# Patient Record
Sex: Male | Born: 1940
Health system: Southern US, Community
[De-identification: ages and names within clinical notes are randomized; demographics above are authoritative.]

## PROBLEM LIST (undated history)

## (undated) DIAGNOSIS — I714 Abdominal aortic aneurysm, without rupture, unspecified: Secondary | ICD-10-CM

## (undated) DIAGNOSIS — E7800 Pure hypercholesterolemia, unspecified: Secondary | ICD-10-CM

## (undated) DIAGNOSIS — F419 Anxiety disorder, unspecified: Secondary | ICD-10-CM

## (undated) DIAGNOSIS — E039 Hypothyroidism, unspecified: Secondary | ICD-10-CM

## (undated) DIAGNOSIS — I639 Cerebral infarction, unspecified: Secondary | ICD-10-CM

## (undated) DIAGNOSIS — C4490 Unspecified malignant neoplasm of skin, unspecified: Secondary | ICD-10-CM

## (undated) DIAGNOSIS — I739 Peripheral vascular disease, unspecified: Secondary | ICD-10-CM

## (undated) DIAGNOSIS — M1990 Unspecified osteoarthritis, unspecified site: Secondary | ICD-10-CM

## (undated) DIAGNOSIS — K219 Gastro-esophageal reflux disease without esophagitis: Secondary | ICD-10-CM

## (undated) DIAGNOSIS — Z86010 Personal history of colonic polyps: Secondary | ICD-10-CM

## (undated) DIAGNOSIS — I10 Essential (primary) hypertension: Secondary | ICD-10-CM

## (undated) DIAGNOSIS — K760 Fatty (change of) liver, not elsewhere classified: Secondary | ICD-10-CM

## (undated) DIAGNOSIS — Z8719 Personal history of other diseases of the digestive system: Secondary | ICD-10-CM

## (undated) HISTORY — PX: ESOPHAGOGASTRODUODENOSCOPY: SHX1529

## (undated) HISTORY — DX: Fatty (change of) liver, not elsewhere classified: K76.0

## (undated) HISTORY — DX: Cerebral infarction, unspecified: I63.9

## (undated) HISTORY — DX: Essential (primary) hypertension: I10

## (undated) HISTORY — PX: INGUINAL HERNIA REPAIR: SUR1180

## (undated) HISTORY — DX: Abdominal aortic aneurysm, without rupture, unspecified: I71.40

## (undated) HISTORY — PX: NOSE SURGERY: SHX723

## (undated) HISTORY — DX: Abdominal aortic aneurysm, without rupture: I71.4

## (undated) HISTORY — DX: Personal history of colonic polyps: Z86.010

## (undated) HISTORY — DX: Gastro-esophageal reflux disease without esophagitis: K21.9

## (undated) HISTORY — DX: Anxiety disorder, unspecified: F41.9

## (undated) HISTORY — PX: TONSILLECTOMY: SUR1361

---

## 1989-01-18 HISTORY — PX: HIP FRACTURE SURGERY: SHX118

## 1999-10-25 ENCOUNTER — Encounter: Admission: RE | Admit: 1999-10-25 | Discharge: 1999-10-25 | Payer: Self-pay | Admitting: Family Medicine

## 1999-10-25 ENCOUNTER — Encounter: Payer: Self-pay | Admitting: Family Medicine

## 1999-10-30 ENCOUNTER — Encounter: Payer: Self-pay | Admitting: Family Medicine

## 1999-10-30 ENCOUNTER — Encounter: Admission: RE | Admit: 1999-10-30 | Discharge: 1999-10-30 | Payer: Self-pay | Admitting: Family Medicine

## 2002-02-10 HISTORY — PX: OTHER SURGICAL HISTORY: SHX169

## 2003-08-03 ENCOUNTER — Encounter: Admission: RE | Admit: 2003-08-03 | Discharge: 2003-08-03 | Payer: Self-pay | Admitting: Internal Medicine

## 2004-09-14 ENCOUNTER — Ambulatory Visit: Payer: Self-pay | Admitting: Endocrinology

## 2004-09-22 ENCOUNTER — Ambulatory Visit (HOSPITAL_COMMUNITY): Admission: RE | Admit: 2004-09-22 | Discharge: 2004-09-22 | Payer: Self-pay | Admitting: Endocrinology

## 2004-09-26 ENCOUNTER — Ambulatory Visit: Payer: Self-pay | Admitting: Endocrinology

## 2004-10-03 ENCOUNTER — Ambulatory Visit: Payer: Self-pay | Admitting: Endocrinology

## 2005-01-11 ENCOUNTER — Ambulatory Visit: Payer: Self-pay | Admitting: Endocrinology

## 2005-11-19 ENCOUNTER — Ambulatory Visit: Payer: Self-pay | Admitting: Endocrinology

## 2006-10-23 ENCOUNTER — Ambulatory Visit: Payer: Self-pay | Admitting: Endocrinology

## 2006-12-22 ENCOUNTER — Ambulatory Visit: Payer: Self-pay | Admitting: Endocrinology

## 2006-12-22 LAB — CONVERTED CEMR LAB
AST: 42 units/L — ABNORMAL HIGH (ref 0–37)
Albumin: 4.5 g/dL (ref 3.5–5.2)
BUN: 10 mg/dL (ref 6–23)
Basophils Relative: 0.5 % (ref 0.0–1.0)
CO2: 29 meq/L (ref 19–32)
Calcium: 9.6 mg/dL (ref 8.4–10.5)
Chloride: 98 meq/L (ref 96–112)
Creatinine, Ser: 0.8 mg/dL (ref 0.4–1.5)
Eosinophils Relative: 4.2 % (ref 0.0–5.0)
GFR calc Af Amer: 125 mL/min
GFR calc non Af Amer: 103 mL/min
Glucose, Bld: 116 mg/dL — ABNORMAL HIGH (ref 70–99)
HCT: 45.6 % (ref 39.0–52.0)
HDL: 32.9 mg/dL — ABNORMAL LOW (ref >39.0)
MCV: 87.3 fL (ref 78.0–100.0)
Nitrite: NEGATIVE
PSA: 0.29 ng/mL (ref 0.10–4.00)
Platelets: 163 10*3/uL (ref 150–400)
RBC / HPF: NONE SEEN
RBC: 5.22 M/uL (ref 4.22–5.81)
RDW: 12.4 % (ref 11.5–14.6)
Sodium: 136 meq/L (ref 135–145)
Total Bilirubin: 1.2 mg/dL (ref 0.3–1.2)
Total CHOL/HDL Ratio: 7.8
Total Protein, Urine: NEGATIVE mg/dL
Triglycerides: 264 mg/dL (ref 0–149)
Urine Glucose: NEGATIVE mg/dL
Urobilinogen, UA: 0.2 (ref 0.0–1.0)
WBC, UA: NONE SEEN cells/hpf

## 2007-03-07 ENCOUNTER — Encounter: Payer: Self-pay | Admitting: Endocrinology

## 2007-03-07 DIAGNOSIS — F17200 Nicotine dependence, unspecified, uncomplicated: Secondary | ICD-10-CM | POA: Insufficient documentation

## 2007-03-07 DIAGNOSIS — I10 Essential (primary) hypertension: Secondary | ICD-10-CM | POA: Insufficient documentation

## 2007-03-07 DIAGNOSIS — Z9089 Acquired absence of other organs: Secondary | ICD-10-CM

## 2007-03-07 DIAGNOSIS — F411 Generalized anxiety disorder: Secondary | ICD-10-CM

## 2007-03-07 DIAGNOSIS — I6529 Occlusion and stenosis of unspecified carotid artery: Secondary | ICD-10-CM

## 2007-03-07 DIAGNOSIS — Z860100 Personal history of colon polyps, unspecified: Secondary | ICD-10-CM | POA: Insufficient documentation

## 2007-03-07 DIAGNOSIS — K219 Gastro-esophageal reflux disease without esophagitis: Secondary | ICD-10-CM | POA: Insufficient documentation

## 2007-03-07 DIAGNOSIS — M25559 Pain in unspecified hip: Secondary | ICD-10-CM | POA: Insufficient documentation

## 2007-03-07 DIAGNOSIS — F528 Other sexual dysfunction not due to a substance or known physiological condition: Secondary | ICD-10-CM | POA: Insufficient documentation

## 2007-03-07 DIAGNOSIS — F329 Major depressive disorder, single episode, unspecified: Secondary | ICD-10-CM

## 2007-03-07 DIAGNOSIS — E7210 Disorders of sulfur-bearing amino-acid metabolism, unspecified: Secondary | ICD-10-CM | POA: Insufficient documentation

## 2007-05-19 ENCOUNTER — Telehealth: Payer: Self-pay | Admitting: Endocrinology

## 2007-11-13 ENCOUNTER — Ambulatory Visit: Payer: Self-pay | Admitting: Endocrinology

## 2007-11-13 LAB — CONVERTED CEMR LAB
Glucose, Urine, Semiquant: NEGATIVE
Ketones, urine, test strip: NEGATIVE
Nitrite: NEGATIVE
Protein, U semiquant: NEGATIVE
Urobilinogen, UA: 0.2
WBC Urine, dipstick: NEGATIVE

## 2008-01-21 ENCOUNTER — Ambulatory Visit: Payer: Self-pay | Admitting: Endocrinology

## 2008-01-21 DIAGNOSIS — M549 Dorsalgia, unspecified: Secondary | ICD-10-CM | POA: Insufficient documentation

## 2008-01-21 DIAGNOSIS — E7800 Pure hypercholesterolemia, unspecified: Secondary | ICD-10-CM

## 2008-01-21 DIAGNOSIS — N529 Male erectile dysfunction, unspecified: Secondary | ICD-10-CM | POA: Insufficient documentation

## 2008-12-06 ENCOUNTER — Telehealth: Payer: Self-pay | Admitting: Internal Medicine

## 2008-12-06 DIAGNOSIS — I714 Abdominal aortic aneurysm, without rupture: Secondary | ICD-10-CM

## 2008-12-27 ENCOUNTER — Ambulatory Visit: Payer: Self-pay | Admitting: Vascular Surgery

## 2008-12-27 ENCOUNTER — Encounter: Payer: Self-pay | Admitting: Endocrinology

## 2009-01-24 ENCOUNTER — Ambulatory Visit: Payer: Self-pay | Admitting: Endocrinology

## 2009-01-24 DIAGNOSIS — R05 Cough: Secondary | ICD-10-CM

## 2009-01-24 DIAGNOSIS — L723 Sebaceous cyst: Secondary | ICD-10-CM

## 2009-01-24 LAB — CONVERTED CEMR LAB
ALT: 43 units/L (ref 0–53)
Alkaline Phosphatase: 47 units/L (ref 39–117)
Basophils Absolute: 0.1 10*3/uL (ref 0.0–0.1)
Basophils Relative: 0.9 % (ref 0.0–3.0)
Bilirubin, Direct: 0 mg/dL (ref 0.0–0.3)
CO2: 30 meq/L (ref 19–32)
Cholesterol: 238 mg/dL — ABNORMAL HIGH (ref 0–200)
Direct LDL: 155.5 mg/dL
Eosinophils Relative: 4.5 % (ref 0.0–5.0)
Glucose, Bld: 123 mg/dL — ABNORMAL HIGH (ref 70–99)
HCT: 43.7 % (ref 39.0–52.0)
Lymphocytes Relative: 35.6 % (ref 12.0–46.0)
MCHC: 35 g/dL (ref 30.0–36.0)
Microalb Creat Ratio: 5.2 mg/g (ref 0.0–30.0)
Monocytes Absolute: 0.7 10*3/uL (ref 0.1–1.0)
Monocytes Relative: 8.8 % (ref 3.0–12.0)
Neutro Abs: 3.7 10*3/uL (ref 1.4–7.7)
Nitrite: NEGATIVE
PSA: 0.22 ng/mL (ref 0.10–4.00)
Platelets: 151 10*3/uL (ref 150.0–400.0)
Potassium: 4.4 meq/L (ref 3.5–5.1)
RDW: 12 % (ref 11.5–14.6)
Specific Gravity, Urine: 1.01 (ref 1.000–1.030)
TSH: 3.12 microintl units/mL (ref 0.35–5.50)
Total CHOL/HDL Ratio: 7
Total Protein, Urine: NEGATIVE mg/dL
Triglycerides: 273 mg/dL — ABNORMAL HIGH (ref 0.0–149.0)
Urine Glucose: NEGATIVE mg/dL
VLDL: 54.6 mg/dL — ABNORMAL HIGH (ref 0.0–40.0)
WBC: 7.5 10*3/uL (ref 4.5–10.5)
pH: 7 (ref 5.0–8.0)

## 2010-01-24 ENCOUNTER — Encounter: Payer: Self-pay | Admitting: Endocrinology

## 2010-01-24 ENCOUNTER — Ambulatory Visit: Payer: Self-pay | Admitting: Endocrinology

## 2010-01-24 DIAGNOSIS — L259 Unspecified contact dermatitis, unspecified cause: Secondary | ICD-10-CM

## 2010-01-26 ENCOUNTER — Telehealth (INDEPENDENT_AMBULATORY_CARE_PROVIDER_SITE_OTHER): Payer: Self-pay | Admitting: *Deleted

## 2010-02-08 ENCOUNTER — Encounter: Payer: Self-pay | Admitting: Endocrinology

## 2010-02-09 ENCOUNTER — Ambulatory Visit: Payer: Self-pay

## 2010-02-09 ENCOUNTER — Encounter: Payer: Self-pay | Admitting: Vascular Surgery

## 2010-02-09 ENCOUNTER — Encounter: Payer: Self-pay | Admitting: Cardiovascular Disease

## 2010-02-09 ENCOUNTER — Encounter: Payer: Self-pay | Admitting: Endocrinology

## 2010-05-07 ENCOUNTER — Ambulatory Visit: Payer: Self-pay | Admitting: Endocrinology

## 2010-05-08 ENCOUNTER — Telehealth: Payer: Self-pay | Admitting: Internal Medicine

## 2010-06-17 LAB — CONVERTED CEMR LAB
Albumin: 4.4 g/dL (ref 3.5–5.2)
Alkaline Phosphatase: 47 units/L (ref 39–117)
Alkaline Phosphatase: 51 units/L (ref 39–117)
BUN: 12 mg/dL (ref 6–23)
Basophils Absolute: 0 10*3/uL (ref 0.0–0.1)
Basophils Absolute: 0.1 10*3/uL (ref 0.0–0.1)
Basophils Relative: 0.4 % (ref 0.0–3.0)
Basophils Relative: 0.9 % (ref 0.0–3.0)
Bilirubin, Direct: 0.1 mg/dL (ref 0.0–0.3)
Bilirubin, Direct: 0.2 mg/dL (ref 0.0–0.3)
Chloride: 96 meq/L (ref 96–112)
Chloride: 99 meq/L (ref 96–112)
Cholesterol: 213 mg/dL (ref 0–200)
Cholesterol: 239 mg/dL — ABNORMAL HIGH (ref 0–200)
Creatinine, Ser: 0.7 mg/dL (ref 0.4–1.5)
Creatinine,U: 133.4 mg/dL
Direct LDL: 172.4 mg/dL
Direct LDL: 204.1 mg/dL
Eosinophils Absolute: 0.3 10*3/uL (ref 0.0–0.7)
Eosinophils Absolute: 0.4 10*3/uL (ref 0.0–0.7)
Eosinophils Relative: 5.1 % — ABNORMAL HIGH (ref 0.0–5.0)
GFR calc non Af Amer: 120 mL/min
GFR calc non Af Amer: 129.75 mL/min (ref >60)
Glucose, Bld: 114 mg/dL — ABNORMAL HIGH (ref 70–99)
Glucose, Bld: 126 mg/dL — ABNORMAL HIGH (ref 70–99)
HCT: 45.8 % (ref 39.0–52.0)
HDL: 37.4 mg/dL — ABNORMAL LOW (ref >39.0)
Hemoglobin, Urine: NEGATIVE
Hemoglobin: 15.9 g/dL (ref 13.0–17.0)
Hgb A1c MFr Bld: 6.1 % — ABNORMAL HIGH (ref 4.6–6.0)
Leukocytes, UA: NEGATIVE
MCV: 88.6 fL (ref 78.0–100.0)
Microalb Creat Ratio: 1.7 mg/g (ref 0.0–30.0)
Microalb Creat Ratio: 8.2 mg/g (ref 0.0–30.0)
Monocytes Absolute: 0.7 10*3/uL (ref 0.1–1.0)
Monocytes Relative: 9.1 % (ref 3.0–12.0)
Neutro Abs: 3.8 10*3/uL (ref 1.4–7.7)
Neutrophils Relative %: 55.1 % (ref 43.0–77.0)
PSA: 0.37 ng/mL (ref 0.10–4.00)
PSA: 0.43 ng/mL (ref 0.10–4.00)
Platelets: 138 10*3/uL — ABNORMAL LOW (ref 150.0–400.0)
Platelets: 143 10*3/uL — ABNORMAL LOW (ref 150–400)
RBC: 5.17 M/uL (ref 4.22–5.81)
RDW: 11.9 % (ref 11.5–14.6)
RDW: 14 % (ref 11.5–14.6)
Sodium: 135 meq/L (ref 135–145)
Sodium: 137 meq/L (ref 135–145)
Specific Gravity, Urine: 1.01 (ref 1.000–1.030)
TSH: 3.27 microintl units/mL (ref 0.35–5.50)
TSH: 4.26 microintl units/mL (ref 0.35–5.50)
Total Bilirubin: 0.7 mg/dL (ref 0.3–1.2)
Total Bilirubin: 1 mg/dL (ref 0.3–1.2)
Total CHOL/HDL Ratio: 6
Total Protein: 6.9 g/dL (ref 6.0–8.3)
Urobilinogen, UA: 0.2 (ref 0.0–1.0)
VLDL: 24 mg/dL (ref 0–40)
VLDL: 39.6 mg/dL (ref 0.0–40.0)

## 2010-06-21 NOTE — Assessment & Plan Note (Signed)
Summary: tick bite??/cd   Vital Signs:  Patient profile:   70 year old male Height:      66 inches (167.64 cm) Weight:      167.50 pounds (76.14 kg) BMI:     27.13 O2 Sat:      90 % on Room air Temp:     97.7 degrees F (36.50 degrees C) oral Pulse rate:   68 / minute Pulse rhythm:   regular BP sitting:   132 / 84  (left arm) Cuff size:   regular  Vitals Entered By: Brenton Grills CMA Duncan Dull) (May 07, 2010 2:18 PM)  O2 Flow:  Room air CC: Tick bite on Left leg/pt declined flu shot/aj Is Patient Diabetic? Yes Comments Pt has never had Zostavax   CC:  Tick bite on Left leg/pt declined flu shot/aj.  History of Present Illness: pt states 2 days of slight pain at the left medial thigh, and assoc rash.  he removed a tick from there, which he brings today.    Current Medications (verified): 1)  Levothroid 50 Mcg Tabs (Levothyroxine Sodium) .... Take 1 By Mouth Qd 2)  Prevacid 15 Mg Cpdr (Lansoprazole) .... Take 1 Capsule By Mouth Once A Day 3)  Zestoretic 20-12.5 Mg Tabs (Lisinopril-Hydrochlorothiazide) .... Take 2 By Mouth Qd 4)  Amlodipine Besylate 5 Mg Tabs (Amlodipine Besylate) .Marland Kitchen.. 1 Qd 5)  Triamcinolone Acetonide 0.1 % Crea (Triamcinolone Acetonide) .... Three Times A Day As Needed For Rash 6)  Alprazolam 0.25 Mg Tabs (Alprazolam) .... 1/2-1 Tab Once Daily As Needed For Anxiety  Allergies (verified): 1)  ! Pcn 2)  ! * Actos 3)  ! Lipitor  Past History:  Past Medical History: Last updated: 03/07/2007 Anxiety Colonic polyps, hx of Depression Diabetes mellitus, type II GERD Hypertension Hepatic Steatosis (R) Parotid Stone  Review of Systems  The patient denies fever.    Physical Exam  General:  normal appearance.   Skin:  left medial thigh:  there is an insect bite, with 1 cm ring of erythema surrounding it.     Impression & Recommendations:  Problem # 1:  TICK BITE (ICD-E906.4) Assessment New  Medications Added to Medication List This Visit: 1)   Doxycycline Hyclate 100 Mg Caps (Doxycycline hyclate) .Marland Kitchen.. 1 tab two times a day  Other Orders: Est. Patient Level III (04540)  Patient Instructions: 1)  doxycycline 100 mg two times a day 2)  call next week if not improving Prescriptions: DOXYCYCLINE HYCLATE 100 MG CAPS (DOXYCYCLINE HYCLATE) 1 tab two times a day  #14 x 0   Entered and Authorized by:   Minus Breeding MD   Signed by:   Minus Breeding MD on 05/07/2010   Method used:   Electronically to        [facility] [address] [phone]* (retail)       [address]       [place], [place]  [address]       Ph: [phone] or [phone]       Fax: [phone]   RxID:   [id number]    Orders Added: 1)  Est. Patient Level III [95638]   Not Administered:    Influenza Vaccine not given due to: declined     Preventive Care Screening  Colonoscopy:    Date:  05/20/2005    Results:  normal

## 2010-06-21 NOTE — Assessment & Plan Note (Signed)
Summary: YEARLY FU/ MEDICARE / TO COME FASTING/NWS  #   Vital Signs:  Patient profile:   70 year old male Height:      66 inches (167.64 cm) Weight:      168 pounds (76.36 kg) BMI:     27.21 O2 Sat:      95 % on Room air Temp:     96.7 degrees F (35.94 degrees C) oral Pulse rate:   65 / minute BP sitting:   118 / 82  (left arm) Cuff size:   regular  Vitals Entered By: Brenton Grills MA (January 24, 2010 8:21 AM)  O2 Flow:  Room air CC: Yearly follow up visit/Medicare/pt is no longer taking Tramadol and is taking Prevacid OTC/aj Is Patient Diabetic? Yes  Vision Screening:Left eye w/o correction: 20 / 20 Right Eye w/o correction: 20 / 25 Both eyes w/o correction:  20/ 20        Vision Entered By: Brenton Grills MA (January 24, 2010 9:21 AM)   CC:  Yearly follow up visit/Medicare/pt is no longer taking Tramadol and is taking Prevacid OTC/aj.  History of Present Illness: pt is here for medicare welllness visit.  he denies memory loss.  he says he is able to perform activities of daily living without assistance.  he has no limitations to physical activity.  depression persists   Current Medications (verified): 1)  Levothroid 50 Mcg Tabs (Levothyroxine Sodium) .... Take 1 By Mouth Qd 2)  Prevacid 15 Mg Cpdr (Lansoprazole) .... Take 1 Capsule By Mouth Once A Day 3)  Zestoretic 20-12.5 Mg Tabs (Lisinopril-Hydrochlorothiazide) .... Take 2 By Mouth Qd 4)  Guanfacine Hcl 1 Mg  Tabs (Guanfacine Hcl) .... Take 1/2 Tab By Mouth Qd 5)  Tramadol Hcl 50 Mg Tabs (Tramadol Hcl) .Marland Kitchen.. 1 Q4h As Needed Pain 6)  Amlodipine Besylate 5 Mg Tabs (Amlodipine Besylate) .Marland Kitchen.. 1 Qd  Allergies (verified): 1)  ! Pcn 2)  ! * Actos 3)  ! Lipitor  Past History:  Past Medical History: Last updated: 03/07/2007 Anxiety Colonic polyps, hx of Depression Diabetes mellitus, type II GERD Hypertension Hepatic Steatosis (R) Parotid Stone  Family History: Reviewed history from 01/21/2008 and no  changes required. no cancer  Social History: Reviewed history from 01/24/2009 and no changes required. married retired Sales executive smoker alcohol is 6-pack every 4 days. no illegal drugs.  Physical Exam  Msk:  pt easily and quickly performs "get-up-and-go" from a sitting position  Psych:  remembers 3/3 at 5 minutes.  excellent recall.  can easily read and write a sentence.  alert and oriented x 3  Additional Exam:  SEPARATE EVALUATION FOLLOWS--EACH PROBLEM HERE IS NEW, NOT RESPONDING TO TREATMENT, OR POSES SIGNIFICANT RISK TO THE PATIENT'S HEALTH: HISTORY OF THE PRESENT ILLNESS: few weeks of moderate rash on the posterior aspects of the legs, chest, and scalp, and assoc itching anxiety has recurred off the xanax pt did not tolerate lipitor in the past. PAST MEDICAL HISTORY reviewed and up to date today REVIEW OF SYSTEMS: denies fever and weight change PHYSICAL EXAMINATION: dorsalis pedis intact bilat.  no carotid bruit neurol:  sensation is intact to touch on the feet feet: no deformity.  no ulcer on the feet.  feet are of normal color and temp.  no edema moderate eczematous rash on these areas LAB/XRAY RESULTS: ldl chol is high IMPRESSION: dyslipidemia, needs increased rx rash, uncertain etiology anxiety, recurrent PLAN: see instruction page   Impression & Recommendations:  Problem #  1:  ROUTINE GENERAL MEDICAL EXAM@HEALTH  CARE FACL (ICD-V70.0)  Problem # 2:  DEPRESSION (ICD-311) poss exac by guanfacine  Medications Added to Medication List This Visit: 1)  Triamcinolone Acetonide 0.1 % Crea (Triamcinolone acetonide) .... Three times a day as needed for rash 2)  Alprazolam 0.25 Mg Tabs (Alprazolam) .... 1/2-1 tab once daily as needed for anxiety  Other Orders: Vascular Clinic (Vascular) Vascular Clinic (Vascular) Dermatology Referral (Derma) EKG w/ Interpretation (93000) TLB-Lipid Panel (80061-LIPID) TLB-BMP (Basic Metabolic Panel-BMET) (80048-METABOL) TLB-CBC  Platelet - w/Differential (85025-CBCD) TLB-Hepatic/Liver Function Pnl (80076-HEPATIC) TLB-TSH (Thyroid Stimulating Hormone) (84443-TSH) TLB-A1C / Hgb A1C (Glycohemoglobin) (83036-A1C) TLB-Microalbumin/Creat Ratio, Urine (82043-MALB) TLB-PSA (Prostate Specific Antigen) (84153-PSA) TLB-Udip w/ Micro (81001-URINE) Medicare -1st Annual Wellness Visit [phone]) Est. Patient Level IV (62952)  Patient Instructions: 1)  please consider these measures for your health:  minimize alcohol.  do not use tobacco products.  have a colonoscopy at least every 10 years from age 69.  keep firearms safely stored.  always use seat belts.  have working smoke alarms in your home.  see an eye doctor and dentist regularly.  never drive under the influence of alcohol or drugs (including prescription drugs).  those with fair skin should take precautions against the sun. 2)  please let me know what your wishes would be, if artificial life support measures should become necessary.  it is critically important to prevent falling down (keep floor areas well-lit, dry, and free of loose objects) 3)  stop guanfacine 4)  good diet and exercise habits significanly improve the control of your diabetes.  please let me know if you wish to be referred to a dietician.  high blood sugar is very risky to your health.  you should see an eye doctor every year. 5)  controlling your blood pressure and cholesterol drastically reduces the damage diabetes does to your body.  this also applies to quitting smoking.  please discuss these with your doctor.  you should take an aspirin every day, unless you have been advised by a doctor not to. 6)  triamcinolone cream three times a day as needed for itching. 7)  check carotid arteries, and abdominal aorta.  you will be called with a day and time for an appointment. 8)  xanax once daily as needed for anxiety.   9)  here is a list of medicare-covered preventive services. 10)  please let me know if you  decide to get a colonoscopy.  it can prevent you from dyinjg from colon cancer. 11)  refer dermatology.  you will be called with a day and time for an appointment 12)  (update: i left message on phone-tree:  you should consider cholesterol medication). Prescriptions: ALPRAZOLAM 0.25 MG TABS (ALPRAZOLAM) 1/2-1 tab once daily as needed for anxiety  #30 x 5   Entered and Authorized by:   Minus Breeding MD   Signed by:   Minus Breeding MD on 01/24/2010   Method used:   Print then Give to Patient   RxID:   [id number] TRIAMCINOLONE ACETONIDE 0.1 % CREA (TRIAMCINOLONE ACETONIDE) three times a day as needed for rash  #1 med tube x 0   Entered and Authorized by:   Minus Breeding MD   Signed by:   Minus Breeding MD on 01/24/2010   Method used:   Electronically to        [facility] [address] [phone]* (retail)       [address]  [place], [place]  [address]       Ph: [phone] or [phone]       Fax: [phone]   RxID:   [id number]

## 2010-06-21 NOTE — Progress Notes (Signed)
Summary: ABX reaction/SAE pt   Phone Note Call from Patient Call back at Home Phone [phone]   Caller: Patient Summary of Call: Pt's spouse called stating that 30 minutes after taking ABX this morning pt vomited and broke out in cold sweats. Pt has not vomited since but is still feeling slightly dizzy. I advised spouse to D/C ABX and I would request advisement for MD. Initial call taken by: Margaret Pyle, CMA,  May 08, 2010 1:57 PM  Follow-up for Phone Call        stop doxy - start septra x 5 days - new erx done - take with food - call if worse or problems Follow-up by: Newt Lukes MD,  May 08, 2010 4:38 PM  Additional Follow-up for Phone Call Additional follow up Details #1::        Pt advised in detail of above and will call back as needed. Additional Follow-up by: Margaret Pyle, CMA,  May 08, 2010 4:55 PM    New/Updated Medications: SEPTRA DS 800-160 MG TABS (SULFAMETHOXAZOLE-TRIMETHOPRIM) 1 by mouth two times a day x 5days Prescriptions: SEPTRA DS 800-160 MG TABS (SULFAMETHOXAZOLE-TRIMETHOPRIM) 1 by mouth two times a day x 5days  #10 x 0   Entered and Authorized by:   Newt Lukes MD   Signed by:   Newt Lukes MD on 05/08/2010   Method used:   Electronically to        [facility] [address] [phone]* (retail)       [address]       [place], [place]  [address]       Ph: [phone] or [phone]       Fax: [phone]   RxID:   [id number]

## 2010-06-21 NOTE — Miscellaneous (Signed)
Summary: Orders Update  Clinical Lists Changes  Orders: Added new Test order of Abdominal Aorta Duplex (Abd Aorta Duplex) - Signed 

## 2010-06-21 NOTE — Progress Notes (Signed)
----   Converted from flag ---- ---- 01/25/2010 1:12 PM, Edman Circle wrote: APPT 9/23 @ 8:00 FOR CAROTID & 9:00 FOR THE AORTA  ---- 01/25/2010 12:16 PM, Dagoberto Reef wrote: Asher Muir, pt need [place] ab aorta and doppler.Thanks ------------------------------

## 2010-06-21 NOTE — Miscellaneous (Signed)
Summary: Orders Update  Clinical Lists Changes  Orders: Added new Test order of Carotid Duplex (Carotid Duplex) - Signed 

## 2010-10-02 NOTE — Procedures (Signed)
DUPLEX ULTRASOUND OF ABDOMINAL AORTA   INDICATION:  Follow up aortic dilatation.   HISTORY:  Diabetes:  No.  Cardiac:  No.  Hypertension:  Yes.  Smoking:  Yes.  Connective Tissue Disorder:  Family History:  No.  Previous Surgery:  No.   DUPLEX EXAM:         AP (cm)                   TRANSVERSE (cm)  Proximal             1.92 cm                   1.83 cm  Mid                  2.23 cm                   2.06 cm  Distal               2.98 cm                   2.92 cm  Right Iliac          P = 1.06 cm, D = 1.23 cm  P = 1.12 cm, D = 1.18 cm  Left Iliac           P = 1.03 cm, D = 1.80 cm  P = 1.12 cm, D = 1.89 cm   PREVIOUS:  Date: 09/04/04  AP:  2.5  TRANSVERSE:  2.7   IMPRESSION:  1. Stable abdominal aortic aneurysm measuring 2.98 cm X 2.92 cm, when      compared to previous studies.  2. Left common iliac artery distally at area of bifurcation appears      dilated, measuring 1.8 cm X 1.89 cm, compared to right at 1.23 cm X      1.18 cm.   ___________________________________________  Ronnie Snyder. Hart Rochester, M.D.   AS/MEDQ  D:  12/27/2008  T:  12/27/2008  Job:  [id number]

## 2010-10-02 NOTE — Consult Note (Signed)
NEW PATIENT CONSULTATION   LETROY, VAZGUEZ  DOB:  August 31, 1940                                       12/27/2008  ZOXWR#:[id number]   Mr. Shober is a 70 year old male patient previously seen by [place]  several years ago for a small abdominal aortic aneurysm, maximum size of  only 3.1 cm.  He is referred today for further evaluation of abdominal  aortic aneurysm.  He denies any abdominal symptoms and has no new back  symptoms, but does have chronic low back discomfort which radiates into  the legs.  He denies claudication.   PAST MEDICAL HISTORY:  Negative for diabetes, DVT, or COPD.  He does  have hypertension and hypercholesterolemia treated medically.   PREVIOUS SURGERY:  Right inguinal hernia, repair right hip pinning, and  previous clavicle fracture.   FAMILY HISTORY:  Positive for CHF, coronary artery disease, stroke in  his father and diabetes in a sibling and grandmother.   SOCIAL HISTORY:  He smokes a pack of cigarettes per day for 45+ years,  drinks occasional alcohol.   REVIEW OF SYSTEMS:  Denies claudication, chest pain, or COPD symptoms.   ALLERGIES:  Penicillin.   PHYSICAL EXAMINATION:  Blood pressure is 160/88, heart rate 71,  respirations 14.  Generally, he is a healthy-appearing male in no  apparent stress, alert and oriented x3.  Neck:  Supple, 3+ carotid  pulses palpable.  No bruits are audible.  Neurologic exam is normal.  No  palpable adenopathy in the neck.  Chest:  Clear to auscultation.  Cardiovascular exam:  Regular rhythm.  No murmurs.  Abdomen:  Soft,  nontender with no masses.  3+ femoral, popliteal, and posterior tibial  pulses bilaterally.   Abdominal aortic aneurysm is stable at 3 cm.  We will monitor this every  other year with a duplex scan in our office unless he develops any acute  abdominal or back symptoms.   Quita Skye Hart Rochester, M.D.  Electronically Signed   JDL/MEDQ  D:  12/27/2008  T:  12/28/2008  Job:  [id number]   cc:   Gregary Signs A. Everardo All, MD

## 2010-10-12 ENCOUNTER — Other Ambulatory Visit: Payer: Self-pay | Admitting: Endocrinology

## 2010-10-31 ENCOUNTER — Other Ambulatory Visit: Payer: Self-pay | Admitting: Endocrinology

## 2010-11-20 ENCOUNTER — Other Ambulatory Visit: Payer: Self-pay | Admitting: Endocrinology

## 2010-12-19 ENCOUNTER — Other Ambulatory Visit: Payer: Self-pay

## 2010-12-25 ENCOUNTER — Other Ambulatory Visit: Payer: Self-pay

## 2011-02-14 ENCOUNTER — Other Ambulatory Visit: Payer: Self-pay | Admitting: Endocrinology

## 2011-02-22 ENCOUNTER — Other Ambulatory Visit: Payer: Self-pay | Admitting: Cardiology

## 2011-02-22 DIAGNOSIS — I714 Abdominal aortic aneurysm, without rupture: Secondary | ICD-10-CM

## 2011-02-25 ENCOUNTER — Encounter (INDEPENDENT_AMBULATORY_CARE_PROVIDER_SITE_OTHER): Payer: Medicare Other | Admitting: Cardiology

## 2011-02-25 DIAGNOSIS — I714 Abdominal aortic aneurysm, without rupture: Secondary | ICD-10-CM

## 2011-03-17 ENCOUNTER — Other Ambulatory Visit: Payer: Self-pay | Admitting: Endocrinology

## 2011-05-07 ENCOUNTER — Other Ambulatory Visit: Payer: Self-pay | Admitting: Endocrinology

## 2011-05-22 ENCOUNTER — Telehealth: Payer: Self-pay

## 2011-05-22 NOTE — Telephone Encounter (Signed)
Was there broken skin and/or bleeding?

## 2011-05-22 NOTE — Telephone Encounter (Signed)
Pt's spouse called stating pt was bitten by their 70 year old dog. Spouse has cleaned area and is using an antibacterial ointment as well.  Dog is up to date with shot but spouse is concerned and is requesting advisement from MD.

## 2011-05-22 NOTE — Telephone Encounter (Signed)
If a small area only, please start right now - OTC triple antibiotic or Bacitracin bid for 7 days  If there is any worsening fever, red, swelling, pain, please call back tomorrow for appt - may need antibx

## 2011-05-22 NOTE — Telephone Encounter (Signed)
Patient informed of MD's instructions

## 2011-05-22 NOTE — Telephone Encounter (Signed)
Per spouse, there was a small area where the skin was broken/mild bleeding, but no further bleeding since area was cleaned.

## 2011-06-27 DIAGNOSIS — M779 Enthesopathy, unspecified: Secondary | ICD-10-CM | POA: Diagnosis not present

## 2011-06-27 DIAGNOSIS — M19079 Primary osteoarthritis, unspecified ankle and foot: Secondary | ICD-10-CM | POA: Diagnosis not present

## 2011-07-25 DIAGNOSIS — M779 Enthesopathy, unspecified: Secondary | ICD-10-CM | POA: Diagnosis not present

## 2011-07-29 ENCOUNTER — Other Ambulatory Visit: Payer: Self-pay | Admitting: Cardiology

## 2011-07-29 DIAGNOSIS — I739 Peripheral vascular disease, unspecified: Secondary | ICD-10-CM

## 2011-07-30 ENCOUNTER — Encounter (INDEPENDENT_AMBULATORY_CARE_PROVIDER_SITE_OTHER): Payer: Medicare Other

## 2011-07-30 ENCOUNTER — Other Ambulatory Visit: Payer: Self-pay

## 2011-07-30 DIAGNOSIS — I70219 Atherosclerosis of native arteries of extremities with intermittent claudication, unspecified extremity: Secondary | ICD-10-CM

## 2011-07-30 DIAGNOSIS — I739 Peripheral vascular disease, unspecified: Secondary | ICD-10-CM

## 2011-07-31 ENCOUNTER — Other Ambulatory Visit: Payer: Self-pay | Admitting: Endocrinology

## 2011-08-14 ENCOUNTER — Encounter: Payer: Self-pay | Admitting: *Deleted

## 2011-08-21 ENCOUNTER — Encounter: Payer: Self-pay | Admitting: Cardiovascular Disease

## 2011-08-21 ENCOUNTER — Encounter (INDEPENDENT_AMBULATORY_CARE_PROVIDER_SITE_OTHER): Payer: Medicare Other

## 2011-08-21 ENCOUNTER — Ambulatory Visit (INDEPENDENT_AMBULATORY_CARE_PROVIDER_SITE_OTHER): Payer: Medicare Other | Admitting: Cardiovascular Disease

## 2011-08-21 VITALS — BP 140/84 | HR 72 | Resp 18 | Ht 69.0 in | Wt 165.8 lb

## 2011-08-21 DIAGNOSIS — I70219 Atherosclerosis of native arteries of extremities with intermittent claudication, unspecified extremity: Secondary | ICD-10-CM | POA: Diagnosis not present

## 2011-08-21 DIAGNOSIS — I739 Peripheral vascular disease, unspecified: Secondary | ICD-10-CM

## 2011-08-21 DIAGNOSIS — I714 Abdominal aortic aneurysm, without rupture: Secondary | ICD-10-CM

## 2011-08-21 MED ORDER — CILOSTAZOL 100 MG PO TABS: 100.0000 mg | ORAL_TABLET | Freq: Two times a day (BID) | ORAL | Status: DC

## 2011-08-21 NOTE — Assessment & Plan Note (Signed)
The patient has moderate reduction in his left ABI, decreased left-sided pulses, and clinical symptoms suggestive of arterial insufficiency of the left leg. I reviewed options of medical therapy versus angiography with an eye towards revascularization. The patient prefers an initial trial of medical therapy. I've asked him to start 81 mg of aspirin daily. He's concerned about a medicine interaction with aspirin, but he cannot remember the name of the medication he is taking. He will call the office tomorrow to let us know. He will also start Pletal 50 mg twice daily for one week then 100 mg twice daily. I would like to see him back in 3 months to assess his clinical response to medical therapy. If he continues to have significant limitation I would recommend considering angiography with an eye toward PTA.  We discussed the importance of complete tobacco cessation as it relates to his abdominal aortic aneurysm and lower extremity peripheral arterial disease. He understands this.

## 2011-08-21 NOTE — Assessment & Plan Note (Signed)
Abdominal duplex scan reviewed. The patient is followed with yearly scans.

## 2011-08-21 NOTE — Progress Notes (Signed)
HPI:  This is a 71 year old gentleman referred for evaluation of lower extremity peripheral arterial disease. The patient has been seen by Dr. Al Corpus for bilateral foot pain. He was treated with cortisone injections and has had a very good response. However, he has complained of left calf pain with ambulation and was noted to have an abnormal pulse exam. He was referred for vascular studies which demonstrated ABIs of 0.87 on the right and 0.68 on the left. A duplex scan was done and this showed diffuse plaque bilaterally with no significant stenosis on the left side. On the right, there was greater than 50% stenosis in the proximal superficial femoral artery.  The patient describes left calf tightness with walking now for several years. The symptoms have been slowly progressive. He denies right leg pain. He denies left thigh or hip pain. His pain eases up after he rests for a few minutes. He has no history of rest pain or ischemic ulceration. The patient is a long-time smoker since age 66 he continues to smoke one pack daily.  He denies chest pain, chest pressure, dyspnea, palpitations, lightheadedness, or other cardiovascular symptoms.  Outpatient Encounter Prescriptions as of 08/21/2011  Medication Sig Dispense Refill  . amLODipine (NORVASC) 5 MG tablet TAKE 1 TABLET BY MOUTH ONCE DAILY  30 tablet  0  . lisinopril-hydrochlorothiazide (PRINZIDE,ZESTORETIC) 20-12.5 MG per tablet TAKE 2 TABLETS BY MOUTH ONCE DAILY  60 tablet  0  . meloxicam (MOBIC) 15 MG tablet       . triamcinolone cream (KENALOG) 0.1 % Apply topically 3 (three) times daily.      Marland Kitchen UNITHROID 50 MCG tablet TAKE 1 TABLET BY MOUTH ONCE DAILY  30 tablet  0  . ALPRAZolam (XANAX) 0.25 MG tablet Take 0.25 mg by mouth at bedtime as needed.      . cilostazol (PLETAL) 100 MG tablet Take 1 tablet (100 mg total) by mouth 2 (two) times daily.  60 tablet  6  . lansoprazole (PREVACID) 15 MG capsule Take 15 mg by mouth daily.        Atorvastatin;  Penicillins; and Pioglitazone  Past Medical History  Diagnosis Date  . Anxiety   . Hx of colonic polyps   . Depression   . Diabetes mellitus   . GERD (gastroesophageal reflux disease)   . HTN (hypertension)   . Hepatic steatosis     Past Surgical History  Procedure Date  . Inguinal hernia repair   . Tonsillectomy   . Esophagogastroduodenoscopy   . Gated spect wall motion stress cardiolite 02/10/2002    History   Social History  . Marital Status: Married    Spouse Name: N/A    Number of Children: N/A  . Years of Education: N/A   Occupational History  . retired     Sales executive   Social History Main Topics  . Smoking status: Current Everyday Smoker  . Smokeless tobacco: Not on file  . Alcohol Use: Yes     6 pack every 4 days  . Drug Use: No  . Sexually Active: Not on file   Other Topics Concern  . Not on file   Social History Narrative  . No narrative on file    Family history: The patient's father had a stroke, his mother died of congestive heart failure. There is no history of aneurysm in the family.  ROS: General: no fevers/chills/night sweats Eyes: no blurry vision, diplopia, or amaurosis ENT: no sore throat or hearing loss Resp: no cough,  wheezing, or hemoptysis CV: no edema or palpitations GI: no abdominal pain, nausea, vomiting, diarrhea, or constipation GU: no dysuria, frequency, or hematuria Skin: no rash Neuro: no headache, numbness, tingling, or weakness of extremities Musculoskeletal: no joint pain or swelling Heme: no bleeding, DVT, or easy bruising Endo: no polydipsia or polyuria  BP 140/84  Pulse 72  Resp 18  Ht 5\' 9"  (1.753 m)  Wt 75.206 kg (165 lb 12.8 oz)  BMI 24.48 kg/m2  PHYSICAL EXAM: Pt is alert and oriented, WD, WN, in no distress. HEENT: normal Neck: JVP normal. Carotid upstrokes normal without bruits. No thyromegaly. Lungs: equal expansion, clear bilaterally CV: Apex is discrete and nondisplaced, RRR without murmur or  gallop Abd: soft, NT, +BS, no bruit, no hepatosplenomegaly Back: no CVA tenderness Ext: no C/C/E        Femoral pulses 2+ on the right and 1+ on the left        DP/PT pulses are 2+ on the right and 1+ on the left Skin: warm and dry without rash Neuro: CNII-XII intact             Strength intact = bilaterally  EKG:  Normal sinus rhythm 68 beats per minute, within normal limits.  ASSESSMENT AND PLAN:

## 2011-08-21 NOTE — Patient Instructions (Signed)
Your physician has recommended you make the following change in your medication: START Pletal 100mg  take one-half tablet by mouth twice a day for one week and then increase to one tablet by mouth twice a day  Your physician wants you to follow-up in: 3 MONTHS with Dr Excell Seltzer.  You will receive a reminder letter in the mail two months in advance. If you don't receive a letter, please call our office to schedule the follow-up appointment.  Please call with the name of medication that cannot be taken with Aspirin.  We will make a decision if it is okay to start Aspirin.

## 2011-08-22 ENCOUNTER — Telehealth: Payer: Self-pay | Admitting: Cardiovascular Disease

## 2011-08-22 NOTE — Telephone Encounter (Signed)
I spoke with the pt's wife and made her aware that the pt can start ASA 81mg  daily. The pt went home and reviewed his medication list and I updated his list.

## 2011-08-22 NOTE — Telephone Encounter (Signed)
New Problem:     Patient called in wanting to let you know about the medication meloxicam 15mg  (Mobic) and how it may interfere with the new medication Dr. Excell Seltzer would like prescribe.  Please call back.

## 2011-08-22 NOTE — Telephone Encounter (Signed)
I will forward this message to Dr Excell Seltzer to make a decision if ASA is okay to start and what dose.

## 2011-08-22 NOTE — Telephone Encounter (Signed)
He should start asa 81 mg daily, thx.

## 2011-08-28 ENCOUNTER — Telehealth: Payer: Self-pay | Admitting: *Deleted

## 2011-08-28 DIAGNOSIS — N320 Bladder-neck obstruction: Secondary | ICD-10-CM

## 2011-08-28 DIAGNOSIS — E119 Type 2 diabetes mellitus without complications: Secondary | ICD-10-CM

## 2011-08-28 DIAGNOSIS — E7800 Pure hypercholesterolemia, unspecified: Secondary | ICD-10-CM

## 2011-08-28 DIAGNOSIS — Z0000 Encounter for general adult medical examination without abnormal findings: Secondary | ICD-10-CM

## 2011-08-28 NOTE — Telephone Encounter (Signed)
Labs entered into Epic for CPX appointment.

## 2011-08-28 NOTE — Telephone Encounter (Signed)
Message copied by Carin Primrose on Wed Aug 28, 2011  3:28 PM ------      Message from: Newell Coral      Created: Tue Aug 13, 2011 10:41 AM      Regarding: cpe sch, needs labs       The pt has scheduled their cpe and is hoping to get labs

## 2011-08-29 ENCOUNTER — Other Ambulatory Visit: Payer: Self-pay | Admitting: Endocrinology

## 2011-09-04 ENCOUNTER — Telehealth: Payer: Self-pay | Admitting: Cardiovascular Disease

## 2011-09-04 DIAGNOSIS — I70219 Atherosclerosis of native arteries of extremities with intermittent claudication, unspecified extremity: Secondary | ICD-10-CM

## 2011-09-04 NOTE — Telephone Encounter (Signed)
Pt needs a call regarding cilostazol 100mg  medication makes him dizzy

## 2011-09-04 NOTE — Telephone Encounter (Signed)
I spoke with the pt and he is complaining of dizziness and his heart pounding in ear while taking Pletal 100mg  twice a day.  The pt did do okay on the half a tablet twice a day. I instructed the pt to skip his dose today and restart Pletal 100mg  take one-half tablet twice a day tomorrow. The pt agreed with plan. The pt will call our office with any other questions or concerns. I made Dr Excell Seltzer aware of my conversation with patient.

## 2011-09-13 ENCOUNTER — Ambulatory Visit (INDEPENDENT_AMBULATORY_CARE_PROVIDER_SITE_OTHER): Payer: Medicare Other | Admitting: Endocrinology

## 2011-09-13 ENCOUNTER — Other Ambulatory Visit (INDEPENDENT_AMBULATORY_CARE_PROVIDER_SITE_OTHER): Payer: Medicare Other

## 2011-09-13 ENCOUNTER — Encounter: Payer: Self-pay | Admitting: Endocrinology

## 2011-09-13 VITALS — BP 130/78 | HR 70 | Temp 97.9°F | Ht 69.0 in | Wt 166.0 lb

## 2011-09-13 DIAGNOSIS — Z0000 Encounter for general adult medical examination without abnormal findings: Secondary | ICD-10-CM

## 2011-09-13 DIAGNOSIS — E119 Type 2 diabetes mellitus without complications: Secondary | ICD-10-CM

## 2011-09-13 DIAGNOSIS — E7800 Pure hypercholesterolemia, unspecified: Secondary | ICD-10-CM

## 2011-09-13 DIAGNOSIS — I798 Other disorders of arteries, arterioles and capillaries in diseases classified elsewhere: Secondary | ICD-10-CM

## 2011-09-13 DIAGNOSIS — N320 Bladder-neck obstruction: Secondary | ICD-10-CM

## 2011-09-13 DIAGNOSIS — E1159 Type 2 diabetes mellitus with other circulatory complications: Secondary | ICD-10-CM

## 2011-09-13 LAB — BASIC METABOLIC PANEL
BUN: 9 mg/dL (ref 6–23)
Calcium: 10 mg/dL (ref 8.4–10.5)
GFR: 118.54 mL/min (ref >60.00)
Glucose, Bld: 108 mg/dL — ABNORMAL HIGH (ref 70–99)
Potassium: 5 mEq/L (ref 3.5–5.1)

## 2011-09-13 LAB — CBC WITH DIFFERENTIAL/PLATELET
Basophils Relative: 0.7 % (ref 0.0–3.0)
Eosinophils Absolute: 0.3 10*3/uL (ref 0.0–0.7)
HCT: 43.8 % (ref 39.0–52.0)
Lymphs Abs: 2.9 10*3/uL (ref 0.7–4.0)
MCHC: 33.5 g/dL (ref 30.0–36.0)
MCV: 88.8 fl (ref 78.0–100.0)
Monocytes Absolute: 0.8 10*3/uL (ref 0.1–1.0)
Neutrophils Relative %: 50.7 % (ref 43.0–77.0)
Platelets: 203 10*3/uL (ref 150.0–400.0)
RBC: 4.93 Mil/uL (ref 4.22–5.81)

## 2011-09-13 LAB — URINALYSIS, ROUTINE W REFLEX MICROSCOPIC
Ketones, ur: NEGATIVE
Leukocytes, UA: NEGATIVE
Specific Gravity, Urine: 1.01 (ref 1.000–1.030)
Total Protein, Urine: NEGATIVE
Urine Glucose: NEGATIVE
pH: 7 (ref 5.0–8.0)

## 2011-09-13 LAB — LIPID PANEL
Cholesterol: 257 mg/dL — ABNORMAL HIGH (ref 0–200)
Total CHOL/HDL Ratio: 6

## 2011-09-13 LAB — HEPATIC FUNCTION PANEL
ALT: 31 U/L (ref 0–53)
AST: 24 U/L (ref 0–37)
Albumin: 4.4 g/dL (ref 3.5–5.2)
Total Protein: 7.2 g/dL (ref 6.0–8.3)

## 2011-09-13 LAB — LDL CHOLESTEROL, DIRECT: Direct LDL: 182.7 mg/dL

## 2011-09-13 LAB — HEMOGLOBIN A1C: Hgb A1c MFr Bld: 6.1 % (ref 4.6–6.5)

## 2011-09-13 MED ORDER — LISINOPRIL-HYDROCHLOROTHIAZIDE 20-12.5 MG PO TABS: ORAL_TABLET | ORAL | Status: DC

## 2011-09-13 MED ORDER — AMLODIPINE BESYLATE 5 MG PO TABS: ORAL_TABLET | ORAL | Status: DC

## 2011-09-13 MED ORDER — LEVOTHYROXINE SODIUM 50 MCG PO TABS: ORAL_TABLET | ORAL | Status: DC

## 2011-09-13 NOTE — Patient Instructions (Addendum)
blood tests are being requested for you today.  You will receive a letter with results. please consider these measures for your health:  minimize alcohol.  do not use tobacco products.  have a colonoscopy at least every 10 years from age 71.  keep firearms safely stored.  always use seat belts.  have working smoke alarms in your home.  see an eye doctor and dentist regularly.  never drive under the influence of alcohol or drugs (including prescription drugs).  those with fair skin should take precautions against the sun. please let me know what your wishes would be, if artificial life support measures should become necessary.  it is critically important to prevent falling down (keep floor areas well-lit, dry, and free of loose objects.  If you have a cane, walker, or wheelchair, you should use it, even for short trips around the house.  Also, try not to rush).   You should have a vaccine against shingles (a painful rash which results from the  chickenpox infection which most people had many years ago).  This vaccine reduces, but does not totally eliminate the risk of shingles.  Because this is a medicare part d benefit, you should get it at a pharmacy.   Please return in 1 year.

## 2011-09-13 NOTE — Progress Notes (Signed)
Subjective:    Patient ID: Ronnie Snyder, male    DOB: 12/25/1940, 71 y.o.   MRN: [id number]  HPI The state of at least three ongoing medical problems is addressed today: HTN:  Denies sob.  Dyslipidemia: denies chest pain. DM: denies weight change. Past Medical History  Diagnosis Date  . Anxiety   . Hx of colonic polyps   . Depression   . Diabetes mellitus   . GERD (gastroesophageal reflux disease)   . HTN (hypertension)   . Hepatic steatosis   . Abdominal aortic aneurysm     3 cm aneurysm followed with serial ultrasound    Past Surgical History  Procedure Date  . Inguinal hernia repair   . Tonsillectomy   . Esophagogastroduodenoscopy   . Gated spect wall motion stress cardiolite 02/10/2002    History   Social History  . Marital Status: Married    Spouse Name: N/A    Number of Children: N/A  . Years of Education: N/A   Occupational History  . retired     Sales executive   Social History Main Topics  . Smoking status: Current Everyday Smoker -- 0.5 packs/day    Types: Cigarettes  . Smokeless tobacco: Not on file  . Alcohol Use: Yes     6 pack every 4 days  . Drug Use: No  . Sexually Active: Not on file   Other Topics Concern  . Not on file   Social History Narrative  . No narrative on file    Current Outpatient Prescriptions on File Prior to Visit  Medication Sig Dispense Refill  . amLODipine (NORVASC) 5 MG tablet take 1 tablet by mouth once daily  90 tablet  3  . aspirin 81 MG tablet Take 81 mg by mouth daily.      . cilostazol (PLETAL) 100 MG tablet Take 0.5 tablets (50 mg total) by mouth 2 (two) times daily.  1 tablet  0  . levothyroxine (UNITHROID) 50 MCG tablet take 1 tablet by mouth once daily  90 tablet  3  . lisinopril-hydrochlorothiazide (PRINZIDE,ZESTORETIC) 20-12.5 MG per tablet take 2 tablets by mouth once daily  180 tablet  3    Allergies  Allergen Reactions  . Atorvastatin     REACTION: sick  . Penicillins     REACTION: rash  .  Pioglitazone     REACTION: nausea    No family history on file.  BP 130/78  Pulse 70  Temp(Src) 97.9 F (36.6 C) (Oral)  Ht 5\' 9"  (1.753 m)  Wt 166 lb (75.297 kg)  BMI 24.51 kg/m2  SpO2 94%    Review of Systems  decreased urinary stream, cough, and edema.    Objective:   Physical Exam VITAL SIGNS:  See vs page GENERAL: no distress LUNGS:  Clear to auscultation HEART:  Regular rate and rhythm without murmurs noted. Normal S1,S2.   Pulses: dorsalis pedis intact bilat.   Feet: no deformity.  no ulcer on the feet.  feet are of normal color and temp.  no edema Neuro: sensation is intact to touch on the feet    Lab Results  Component Value Date   WBC 8.4 09/13/2011   HGB 14.7 09/13/2011   HCT 43.8 09/13/2011   PLT 203.0 09/13/2011   GLUCOSE 108* 09/13/2011   CHOL 257* 09/13/2011   TRIG 210.0* 09/13/2011   HDL 43.30 09/13/2011   LDLDIRECT 182.7 09/13/2011   ALT 31 09/13/2011   AST 24 09/13/2011   NA 136 09/13/2011  K 5.0 09/13/2011   CL 96 09/13/2011   CREATININE 0.7 09/13/2011   BUN 9 09/13/2011   CO2 30 09/13/2011   TSH 3.94 09/13/2011   PSA 0.67 09/13/2011   HGBA1C 6.1 09/13/2011   MICROALBUR 2.3* 01/24/2010      Assessment & Plan:  Dyslipidemia.  He needs aggressive rx in view of PAD. HTN.  well-controlled DM.  well-controlled on no med    Subjective:   Patient here for Medicare annual wellness visit and management of other chronic and acute problems.     Risk factors: advanced age    Roster of Physicians Providing Medical Care to Patient:  See "snapshot"   Activities of Daily Living: In your present state of health, do you have any difficulty performing the following activities?:  Preparing food and eating?: No  Bathing yourself: No  Getting dressed: No  Using the toilet: No  Moving around from place to place: No  In the past year have you fallen or had a near fall?:No    Home Safety: Has smoke detector and wears seat belts. Firearms are safely stored. No  excess sun exposure.  Diet and Exercise  Current exercise habits: pt says limited by health probs Dietary issues discussed: pt reports a healthy diet   Depression Screen  Q1: Over the past two weeks, have you felt down, depressed or hopeless? no  Q2: Over the past two weeks, have you felt little interest or pleasure in doing things? no   The following portions of the patient's history were reviewed and updated as appropriate: allergies, current medications, past family history, past medical history, past social history, past surgical history and problem list.  Past Medical History  Diagnosis Date  . Anxiety   . Hx of colonic polyps   . Depression   . Diabetes mellitus   . GERD (gastroesophageal reflux disease)   . HTN (hypertension)   . Hepatic steatosis   . Abdominal aortic aneurysm     3 cm aneurysm followed with serial ultrasound    Past Surgical History  Procedure Date  . Inguinal hernia repair   . Tonsillectomy   . Esophagogastroduodenoscopy   . Gated spect wall motion stress cardiolite 02/10/2002    History   Social History  . Marital Status: Married    Spouse Name: N/A    Number of Children: N/A  . Years of Education: N/A   Occupational History  . retired     Sales executive   Social History Main Topics  . Smoking status: Current Everyday Smoker -- 0.5 packs/day    Types: Cigarettes  . Smokeless tobacco: Not on file  . Alcohol Use: Yes     6 pack every 4 days  . Drug Use: No  . Sexually Active: Not on file   Other Topics Concern  . Not on file   Social History Narrative  . No narrative on file    Current Outpatient Prescriptions on File Prior to Visit  Medication Sig Dispense Refill  . amLODipine (NORVASC) 5 MG tablet take 1 tablet by mouth once daily  30 tablet  1  . aspirin 81 MG tablet Take 81 mg by mouth daily.      . cilostazol (PLETAL) 100 MG tablet Take 0.5 tablets (50 mg total) by mouth 2 (two) times daily.  1 tablet  0  .  lisinopril-hydrochlorothiazide (PRINZIDE,ZESTORETIC) 20-12.5 MG per tablet take 2 tablets by mouth once daily  60 tablet  1  . UNITHROID 50 MCG  tablet take 1 tablet by mouth once daily  30 tablet  1    Allergies  Allergen Reactions  . Atorvastatin     REACTION: sick  . Penicillins     REACTION: rash  . Pioglitazone     REACTION: nausea    No family history on file.  BP 130/78  Pulse 70  Temp(Src) 97.9 F (36.6 C) (Oral)  Ht 5\' 9"  (1.753 m)  Wt 166 lb (75.297 kg)  BMI 24.51 kg/m2  SpO2 94%   Review of Systems  Denies hearing loss, and visual loss Objective:   Vision:  Sees opthalmologist Hearing: grossly normal Body mass index:  See vs page Msk: pt easily and quickly performs "get-up-and-go" from a sitting position. Cognitive Impairment Assessment: cognition, memory and judgment appear normal.  remembers 3/3 at 5 minutes.  excellent recall.  can easily read and write a sentence.  alert and oriented x 3.     Assessment:   Medicare wellness utd on preventive parameters    Plan:   During the course of the visit the patient was educated and counseled about appropriate screening and preventive services including:        Fall prevention   Diabetes screening  Nutrition counseling   Vaccines / LABS Zostavax / Pnemonccoal Vaccine  today  PSA  Patient Instructions (the written plan) was given to the patient.

## 2011-09-14 ENCOUNTER — Encounter: Payer: Self-pay | Admitting: Endocrinology

## 2011-09-16 ENCOUNTER — Telehealth: Payer: Self-pay | Admitting: *Deleted

## 2011-09-16 NOTE — Telephone Encounter (Signed)
Called pt to inform of lab results, pt informed (letter also mailed to pt). 

## 2011-10-01 DIAGNOSIS — M779 Enthesopathy, unspecified: Secondary | ICD-10-CM | POA: Diagnosis not present

## 2011-11-27 ENCOUNTER — Ambulatory Visit (INDEPENDENT_AMBULATORY_CARE_PROVIDER_SITE_OTHER): Payer: Medicare Other | Admitting: Cardiovascular Disease

## 2011-11-27 ENCOUNTER — Encounter: Payer: Self-pay | Admitting: Cardiovascular Disease

## 2011-11-27 ENCOUNTER — Encounter (HOSPITAL_COMMUNITY): Payer: Self-pay | Admitting: Pharmacy Technician

## 2011-11-27 VITALS — BP 132/76 | HR 75 | Ht 69.0 in | Wt 166.0 lb

## 2011-11-27 DIAGNOSIS — I70219 Atherosclerosis of native arteries of extremities with intermittent claudication, unspecified extremity: Secondary | ICD-10-CM

## 2011-11-27 LAB — PROTIME-INR: Prothrombin Time: 11 s (ref 10.2–12.4)

## 2011-11-27 LAB — CBC WITH DIFFERENTIAL/PLATELET
Basophils Relative: 0.8 % (ref 0.0–3.0)
Eosinophils Relative: 3.7 % (ref 0.0–5.0)
Lymphocytes Relative: 31.9 % (ref 12.0–46.0)
MCV: 89.7 fl (ref 78.0–100.0)
Neutrophils Relative %: 54.5 % (ref 43.0–77.0)
RBC: 5.02 Mil/uL (ref 4.22–5.81)
WBC: 7.8 10*3/uL (ref 4.5–10.5)

## 2011-11-27 LAB — BASIC METABOLIC PANEL
Chloride: 96 mEq/L (ref 96–112)
Creatinine, Ser: 0.6 mg/dL (ref 0.4–1.5)

## 2011-11-27 NOTE — Patient Instructions (Addendum)
Your physician recommends that you schedule a follow-up appointment in: 4 WEEKS with Dr Excell Seltzer  Your physician has requested that you have a peripheral vascular angiogram. This exam is performed at the hospital. During this exam IV contrast is used to look at arterial blood flow. Please review the information sheet given for details.  Your physician recommends that you continue on your current medications as directed. Please refer to the Current Medication list given to you today.

## 2011-11-27 NOTE — Progress Notes (Signed)
HPI:  71 year old gentleman presenting for followup of peripheral arterial disease. He was initially seen in April 2013 for intermittent claudication. His ABIs were 0.87 on the right and 0.68 on the left. He had greater than 50% proximal SFA stenosis on the right, but no significant SFA disease on the left. He was started on Pletal and aspirin and returns today for followup evaluation. The patient also has a small 3 cm abdominal aortic aneurysm followed by serial duplex studies.  The patient continues to have significant limitation from left leg pain. He describes severe pain in the left calf and foot with walking and short distance. He's been unable to do much work at all. Yesterday he tried to work on a ladder but with going up and down the ladder he had significant pain in the leg. He continues to smoke cigarettes and has not been able to stop. He smokes about one half pack per day. He denies rest pain or ulceration. He has mild symptoms on the right leg, but these are not particularly lifestyle limiting. He describes his left calf pain as an ache. He was unable to tolerate Pletal at high dose because of dizziness. He has been able to tolerate 50 mg twice daily with minimal symptoms. He is taking aspirin irregularly. He denies chest pain or pressure. He denies dyspnea, palpitations, orthopnea, or PND.  Outpatient Encounter Prescriptions as of 11/27/2011  Medication Sig Dispense Refill  . amLODipine (NORVASC) 5 MG tablet take 1 tablet by mouth once daily  90 tablet  3  . aspirin 81 MG tablet Take 81 mg by mouth as needed.       . Cholecalciferol (VITAMIN D-3 PO) Take by mouth 2 (two) times a week.      . cilostazol (PLETAL) 100 MG tablet Take 0.5 tablets (50 mg total) by mouth 2 (two) times daily.  1 tablet  0  . lansoprazole (PREVACID) 30 MG capsule Take 30 mg by mouth every other day.      . levothyroxine (UNITHROID) 50 MCG tablet take 1 tablet by mouth once daily  90 tablet  3  .  lisinopril-hydrochlorothiazide (PRINZIDE,ZESTORETIC) 20-12.5 MG per tablet take 2 tablets by mouth once daily  180 tablet  3  . magnesium oxide (MAG-OX) 400 MG tablet Take 400 mg by mouth as needed.      . triamcinolone cream (KENALOG) 0.1 % Apply 1 application topically 2 (two) times daily.         Allergies  Allergen Reactions  . Atorvastatin     REACTION: sick  . Penicillins     REACTION: rash  . Pioglitazone     REACTION: nausea    Past Medical History  Diagnosis Date  . Anxiety   . Hx of colonic polyps   . Depression   . Diabetes mellitus   . GERD (gastroesophageal reflux disease)   . HTN (hypertension)   . Hepatic steatosis   . Abdominal aortic aneurysm     3 cm aneurysm followed with serial ultrasound    ROS: Negative except as per HPI  BP 132/76  Pulse 75  Ht 5\' 9"  (1.753 m)  Wt 75.297 kg (166 lb)  BMI 24.51 kg/m2  PHYSICAL EXAM: Pt is alert and oriented, pleasant gentleman in NAD HEENT: normal Neck: JVP - normal, carotids 2+= without bruits Lungs: CTA bilaterally CV: RRR without murmur or gallop Abd: soft, NT, Positive BS, no hepatomegaly Ext: no C/C/E, femoral pulses 2+ on the right and 1+ on  the left, DP and PT pulses are 2+ on the right and 1+ on the left. Skin: warm/dry no rash  ASSESSMENT AND PLAN: 1. Atherosclerosis of native arteries of the extremities with intermittent claudication. The patient has failed a trial of medical therapy with aspirin and Pletal. He has been unable to institute a walking program because of severe limitation related to left leg pain. He has no other functional limitations. I have reviewed the risks, indications, and alternatives to lower extremity angiography with possible PTA and stenting. He understands and agrees to proceed. He will continue on his current medical program.  2. Abdominal aortic aneurysm. This is small and will be followed by serial duplex studies. His aneurysm is only 3 cm in diameter.  3. Essential  hypertension. Well controlled on current medical program.  [name] 11/27/2011 9:05 AM

## 2011-12-03 ENCOUNTER — Other Ambulatory Visit: Payer: Self-pay | Admitting: Cardiovascular Disease

## 2011-12-03 DIAGNOSIS — I70219 Atherosclerosis of native arteries of extremities with intermittent claudication, unspecified extremity: Secondary | ICD-10-CM

## 2011-12-04 ENCOUNTER — Encounter (HOSPITAL_COMMUNITY)
Admission: RE | Disposition: A | Discharge status: Home / Self Care | Payer: Self-pay | Source: Ambulatory Visit | Attending: Cardiovascular Disease

## 2011-12-04 ENCOUNTER — Ambulatory Visit (HOSPITAL_COMMUNITY)
Admission: RE | Admit: 2011-12-04 | Discharge: 2011-12-04 | Disposition: A | Discharge status: Home / Self Care | Payer: Medicare Other | Source: Ambulatory Visit | Attending: Cardiovascular Disease | Admitting: Cardiovascular Disease

## 2011-12-04 DIAGNOSIS — I739 Peripheral vascular disease, unspecified: Secondary | ICD-10-CM | POA: Insufficient documentation

## 2011-12-04 DIAGNOSIS — I714 Abdominal aortic aneurysm, without rupture, unspecified: Secondary | ICD-10-CM | POA: Insufficient documentation

## 2011-12-04 DIAGNOSIS — I70219 Atherosclerosis of native arteries of extremities with intermittent claudication, unspecified extremity: Secondary | ICD-10-CM

## 2011-12-04 DIAGNOSIS — I10 Essential (primary) hypertension: Secondary | ICD-10-CM | POA: Insufficient documentation

## 2011-12-04 HISTORY — PX: ABDOMINAL ANGIOGRAM: SHX5499

## 2011-12-04 SURGERY — ABDOMINAL ANGIOGRAM
Anesthesia: LOCAL

## 2011-12-04 MED ORDER — ACETAMINOPHEN 325 MG PO TABS
650.0000 mg | ORAL_TABLET | ORAL | Status: DC | PRN
  Administered 2011-12-04: 650 mg via ORAL

## 2011-12-04 MED ORDER — SODIUM CHLORIDE 0.9 % IJ SOLN: 3.0000 mL | Freq: Two times a day (BID) | INTRAMUSCULAR | Status: DC

## 2011-12-04 MED ORDER — ONDANSETRON HCL 4 MG/2ML IJ SOLN: 4.0000 mg | Freq: Four times a day (QID) | INTRAMUSCULAR | Status: DC | PRN

## 2011-12-04 MED ORDER — SODIUM CHLORIDE 0.9 % IV SOLN: 1.0000 mL/kg/h | INTRAVENOUS | Status: DC

## 2011-12-04 MED ORDER — MIDAZOLAM HCL 2 MG/2ML IJ SOLN
INTRAMUSCULAR | Status: AC
  Filled 2011-12-04: qty 2

## 2011-12-04 MED ORDER — SODIUM CHLORIDE 0.9 % IV SOLN
INTRAVENOUS | Status: DC
  Administered 2011-12-04: 08:00:00 via INTRAVENOUS

## 2011-12-04 MED ORDER — SODIUM CHLORIDE 0.9 % IV SOLN: 250.0000 mL | INTRAVENOUS | Status: DC | PRN

## 2011-12-04 MED ORDER — ASPIRIN 81 MG PO CHEW
324.0000 mg | CHEWABLE_TABLET | ORAL | Status: AC
  Administered 2011-12-04: 324 mg via ORAL
  Filled 2011-12-04: qty 4

## 2011-12-04 MED ORDER — DIAZEPAM 5 MG PO TABS
5.0000 mg | ORAL_TABLET | ORAL | Status: AC
  Administered 2011-12-04: 5 mg via ORAL
  Filled 2011-12-04: qty 1

## 2011-12-04 MED ORDER — SODIUM CHLORIDE 0.9 % IJ SOLN: 3.0000 mL | INTRAMUSCULAR | Status: DC | PRN

## 2011-12-04 MED ORDER — ACETAMINOPHEN 325 MG PO TABS
ORAL_TABLET | ORAL | Status: AC
  Administered 2011-12-04: 650 mg via ORAL
  Filled 2011-12-04: qty 2

## 2011-12-04 MED ORDER — CLOPIDOGREL BISULFATE 75 MG PO TABS
300.0000 mg | ORAL_TABLET | ORAL | Status: AC
  Administered 2011-12-04: 300 mg via ORAL
  Filled 2011-12-04: qty 4

## 2011-12-04 MED ORDER — FENTANYL CITRATE 0.05 MG/ML IJ SOLN
INTRAMUSCULAR | Status: AC
  Filled 2011-12-04: qty 2

## 2011-12-04 NOTE — H&P (View-Only) (Signed)
 HPI:  71-year-old gentleman presenting for followup of peripheral arterial disease. He was initially seen in April 2013 for intermittent claudication. His ABIs were 0.87 on the right and 0.68 on the left. He had greater than 50% proximal SFA stenosis on the right, but no significant SFA disease on the left. He was started on Pletal and aspirin and returns today for followup evaluation. The patient also has a small 3 cm abdominal aortic aneurysm followed by serial duplex studies.  The patient continues to have significant limitation from left leg pain. He describes severe pain in the left calf and foot with walking and short distance. He's been unable to do much work at all. Yesterday he tried to work on a ladder but with going up and down the ladder he had significant pain in the leg. He continues to smoke cigarettes and has not been able to stop. He smokes about one half pack per day. He denies rest pain or ulceration. He has mild symptoms on the right leg, but these are not particularly lifestyle limiting. He describes his left calf pain as an ache. He was unable to tolerate Pletal at high dose because of dizziness. He has been able to tolerate 50 mg twice daily with minimal symptoms. He is taking aspirin irregularly. He denies chest pain or pressure. He denies dyspnea, palpitations, orthopnea, or PND.  Outpatient Encounter Prescriptions as of 11/27/2011  Medication Sig Dispense Refill  . amLODipine (NORVASC) 5 MG tablet take 1 tablet by mouth once daily  90 tablet  3  . aspirin 81 MG tablet Take 81 mg by mouth as needed.       . Cholecalciferol (VITAMIN D-3 PO) Take by mouth 2 (two) times a week.      . cilostazol (PLETAL) 100 MG tablet Take 0.5 tablets (50 mg total) by mouth 2 (two) times daily.  1 tablet  0  . lansoprazole (PREVACID) 30 MG capsule Take 30 mg by mouth every other day.      . levothyroxine (UNITHROID) 50 MCG tablet take 1 tablet by mouth once daily  90 tablet  3  .  lisinopril-hydrochlorothiazide (PRINZIDE,ZESTORETIC) 20-12.5 MG per tablet take 2 tablets by mouth once daily  180 tablet  3  . magnesium oxide (MAG-OX) 400 MG tablet Take 400 mg by mouth as needed.      . triamcinolone cream (KENALOG) 0.1 % Apply 1 application topically 2 (two) times daily.         Allergies  Allergen Reactions  . Atorvastatin     REACTION: sick  . Penicillins     REACTION: rash  . Pioglitazone     REACTION: nausea    Past Medical History  Diagnosis Date  . Anxiety   . Hx of colonic polyps   . Depression   . Diabetes mellitus   . GERD (gastroesophageal reflux disease)   . HTN (hypertension)   . Hepatic steatosis   . Abdominal aortic aneurysm     3 cm aneurysm followed with serial ultrasound    ROS: Negative except as per HPI  BP 132/76  Pulse 75  Ht 5' 9" (1.753 m)  Wt 75.297 kg (166 lb)  BMI 24.51 kg/m2  PHYSICAL EXAM: Pt is alert and oriented, pleasant gentleman in NAD HEENT: normal Neck: JVP - normal, carotids 2+= without bruits Lungs: CTA bilaterally CV: RRR without murmur or gallop Abd: soft, NT, Positive BS, no hepatomegaly Ext: no C/C/E, femoral pulses 2+ on the right and 1+ on   the left, DP and PT pulses are 2+ on the right and 1+ on the left. Skin: warm/dry no rash  ASSESSMENT AND PLAN: 1. Atherosclerosis of native arteries of the extremities with intermittent claudication. The patient has failed a trial of medical therapy with aspirin and Pletal. He has been unable to institute a walking program because of severe limitation related to left leg pain. He has no other functional limitations. I have reviewed the risks, indications, and alternatives to lower extremity angiography with possible PTA and stenting. He understands and agrees to proceed. He will continue on his current medical program.  2. Abdominal aortic aneurysm. This is small and will be followed by serial duplex studies. His aneurysm is only 3 cm in diameter.  3. Essential  hypertension. Well controlled on current medical program.  Aeriana Speece 11/27/2011 9:05 AM       

## 2011-12-04 NOTE — CV Procedure (Signed)
Erroneous encounter. Duplicate note.  Ronnie Snyder

## 2011-12-04 NOTE — Interval H&P Note (Signed)
History and Physical Interval Note:  12/04/2011 10:37 AM  Ronnie Snyder  has presented today for surgery, with the diagnosis of pvd  The various methods of treatment have been discussed with the patient and family. After consideration of risks, benefits and other options for treatment, the patient has consented to  Procedure(s) (LRB): ABDOMINAL ANGIOGRAM (N/A) as a surgical intervention .  The patient's history has been reviewed, patient examined, no change in status, stable for surgery.  I have reviewed the patients' chart and labs.  Questions were answered to the patient's satisfaction.    The procedural risks, indications, and alternatives have been reviewed in detail with the patient. He was loaded with 300 mg of Plavix this morning in anticipation of possible intervention. Will plan right groin access with focus on the left leg.  [name]  12/04/2011 10:37 AM

## 2011-12-05 ENCOUNTER — Telehealth: Payer: Self-pay | Admitting: Surgery

## 2011-12-05 NOTE — Telephone Encounter (Addendum)
Message copied by Rosalyn Charters on Thu Dec 05, 2011 10:43 AM ------      Message from: Melene Plan      Created: Thu Dec 05, 2011  9:18 AM      Regarding: FW: PT NEEDS SURGERY                   ----- Message -----         From: Dewain Penning         Sent: 12/04/2011   2:27 PM           To: Melene Plan, RN      Subject: PT NEEDS SURGERY                                         Darel Hong,             Can you please arrange for Ronnie Snyder to see Dr Myra Gianotti. Pt had a angio today by Dr Excell Seltzer. Dr Myra Gianotti looked at the films with Dr Excell Seltzer. Pt needs to have fem pop surgery.            Thanks so much!!            Page me if you have ?'s [phone].            Bethann Berkshire  notified pt. of appt with dr. Myra Gianotti on 12-30-11 at 1:45 and mailed np info

## 2011-12-06 NOTE — CV Procedure (Signed)
   Peripheral vascular catheterization Procedure Note  Name: Ronnie Snyder MRN: [id number] DOB: 16-Sep-1940  Procedure: Abdominal aortic angiography with bilateral lower extremity runoff  Procedural indication: Severe intermittent claudication of the left leg  Procedural details: The right groin was prepped, draped, and anesthetized with 1% lidocaine. Using the modified Seldinger technique a 5 French sheath was placed in the right femoral artery. A pigtail catheter was advanced to the suprarenal aorta and aortogram was performed under digital subtraction angiography. The pigtail catheter was brought down to the distal aortic bifurcation and an angiogram was again performed using digital subtraction. A bilateral lower sternum he runoff was then performed using a bolus chase technique. The patient tolerated the procedure well. There were no immediate complications.  Procedural findings: There are single, patent renal arteries bilaterally. The abdominal aorta is patent with dilatation of the distal abdominal aorta. This may be a very small aneurysm.  Right leg: The common, external, and internal iliac arteries are patent. The common femoral artery has mild calcific stenosis. The superficial femoral artery has diffuse heavily calcified stenosis but it is patent throughout its course and there are no critical lesions identified. The deep femoral artery is patent. The popliteal artery is patent. There is 2 vessel runoff to the right foot.  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 The external and internal iliac arteries are patent. The common femoral artery at the junction of the external iliac and common femoral has a severe exophytic plaque. The distal common femoral artery involving the bifurcation of the deep and superficial femoral arteries has another tight stenosis. The deep femoral artery is patent. The superficial femoral artery is heavily calcified with severe diffuse plaque. There  is another exophytic tight plaque in the left popliteal artery. There is 2 vessel runoff to the left ankle.  Final Conclusions:   1. Multiple severe stenoses in the arterial system of the left leg involving the left common femoral, superficial femoral, and popliteal arteries 2. Moderate stenosis of the right superficial femoral artery with diffuse heavy calcification 3. 2 vessel runoff bilaterally  Recommendations: Referral to vascular surgery for consideration of left common femoral endarterectomy. The patient has significant inflow disease related to the lesions in his common femoral artery. I suspect improving inflow will dramatically improve his claudication symptoms. He does have diffuse disease throughout his left superficial femoral and popliteal arteries and this may require revascularization at some point. His films were reviewed with Dr. Myra Gianotti and he will be referred for outpatient evaluation.  [name] 12/06/2011, 5:25 PM

## 2011-12-09 ENCOUNTER — Telehealth: Payer: Self-pay | Admitting: Cardiovascular Disease

## 2011-12-09 NOTE — Telephone Encounter (Signed)
I spoke with the pt and he is complaining of pain in his legs with walking.  The pt is also having some discomfort in his right thigh at rest.  I made the pt aware that due to his severe claudication he can have pain related to exertion.  The pt is scheduled to see Dr Myra Gianotti on 12/30/11.  I will forward this information to Dr Excell Seltzer for any further recommendations.

## 2011-12-09 NOTE — Telephone Encounter (Signed)
Please return call to patient at [phone]  Patient had procedure last week, having pain in legs. Please return call to patient.

## 2011-12-09 NOTE — Telephone Encounter (Signed)
Would decrease walking until seen by Dr Myra Gianotti. I would rather not prescribe pain medication for claudication as this won't be effective.  Ronnie Snyder 12/09/2011 2:33 PM

## 2011-12-10 NOTE — Telephone Encounter (Signed)
Pt aware of Dr Cooper's recommendation.  

## 2011-12-27 ENCOUNTER — Encounter: Payer: Self-pay | Admitting: Surgery

## 2011-12-30 ENCOUNTER — Ambulatory Visit (INDEPENDENT_AMBULATORY_CARE_PROVIDER_SITE_OTHER): Payer: Medicare Other | Admitting: Surgery

## 2011-12-30 ENCOUNTER — Encounter: Payer: Self-pay | Admitting: Surgery

## 2011-12-30 VITALS — BP 133/77 | HR 77 | Resp 16 | Ht 69.0 in | Wt 164.0 lb

## 2011-12-30 DIAGNOSIS — I70219 Atherosclerosis of native arteries of extremities with intermittent claudication, unspecified extremity: Secondary | ICD-10-CM

## 2011-12-30 NOTE — Progress Notes (Addendum)
[facility] Specialist of [place]   Patient name: Ronnie Snyder MRN: [id number] DOB: 09/08/1940 Sex: male   Referred by: Dr. Cooper  Reason for referral:  Chief Complaint  Patient presents with  . PVD    consult for fem pop     HISTORY OF PRESENT ILLNESS: The patient is referred today for evaluation of left leg claudication. He is sent to me by Dr. Cooper who has preformed angiography and found disease that would be best treated operatively. He has been seen in our office previously by Dr. Lawson for evaluation of a small abdominal aortic aneurysm. He is not having a followed here currently. The patient states that for many years he has had symptoms in his left leg. They have become much more severe. He now describes what sounds like rest pain at night. He gets cramping with minimal activity. He continues to smoke. He does not have any ulcerations. He has tried Pletal but had difficulty tolerating this do to dizziness. He is now taking a half a dose.  The patient is medically managed for his diabetes and hypertension.  Past Medical History  Diagnosis Date  . Anxiety   . Hx of colonic polyps   . Depression   . GERD (gastroesophageal reflux disease)   . HTN (hypertension)   . Hepatic steatosis   . Abdominal aortic aneurysm     3 cm aneurysm followed with serial ultrasound    Past Surgical History  Procedure Date  . Inguinal hernia repair   . Tonsillectomy   . Esophagogastroduodenoscopy   . Gated spect wall motion stress cardiolite 02/10/2002  . Nose surgery     History   Social History  . Marital Status: Married    Spouse Name: N/A    Number of Children: N/A  . Years of Education: N/A   Occupational History  . retired     optician   Social History Main Topics  . Smoking status: Current Everyday Smoker -- 1.0 packs/day for 50 years    Types: Cigarettes  . Smokeless tobacco: Never Used   Comment: pt states there is no hope for quitting  . Alcohol  Use: Yes     6 pack every 4 days  . Drug Use: No  . Sexually Active: Not on file   Other Topics Concern  . Not on file   Social History Narrative  . No narrative on file    Family History  Problem Relation Age of Onset  . Hypertension Mother   . Hypertension Father   . Hypertension Sister   . Hypertension Sister     Allergies as of 12/30/2011 - Review Complete 12/30/2011  Allergen Reaction Noted  . Atorvastatin    . Penicillins    . Pioglitazone      Current Outpatient Prescriptions on File Prior to Visit  Medication Sig Dispense Refill  . amLODipine (NORVASC) 5 MG tablet take 1 tablet by mouth once daily  90 tablet  3  . aspirin 81 MG tablet Take 81 mg by mouth every other day.       . Cholecalciferol (VITAMIN D-3 PO) Take by mouth 2 (two) times a week.      . cilostazol (PLETAL) 100 MG tablet Take 0.5 tablets (50 mg total) by mouth 2 (two) times daily.  1 tablet  0  . lansoprazole (PREVACID) 30 MG capsule Take 30 mg by mouth every other day.      . levothyroxine (UNITHROID) 50 MCG tablet take   1 tablet by mouth once daily  90 tablet  3  . lisinopril-hydrochlorothiazide (PRINZIDE,ZESTORETIC) 20-12.5 MG per tablet take 2 tablets by mouth once daily  180 tablet  3  . magnesium oxide (MAG-OX) 400 MG tablet Take 400 mg by mouth daily.       . triamcinolone cream (KENALOG) 0.1 % Apply 1 application topically 2 (two) times daily.          REVIEW OF SYSTEMS: Cardiovascular: No chest pain, chest pressure, palpitations, orthopnea, or dyspnea on exertion,  No history of DVT or phlebitis. Pulmonary: No productive cough, asthma or wheezing. Neurologic: No weakness, paresthesias, aphasia, or amaurosis. No dizziness. Hematologic: No bleeding problems or clotting disorders. Musculoskeletal: No joint pain or joint swelling. Gastrointestinal: No blood in stool or hematemesis Genitourinary: No dysuria or hematuria. Psychiatric:: No history of major depression. Integumentary: No rashes  or ulcers. Constitutional: No fever or chills.  PHYSICAL EXAMINATION: General: The patient appears their stated age.  Vital signs are BP 133/77  Pulse 77  Resp 16  Ht 5' 9" (1.753 m)  Wt 164 lb (74.39 kg)  BMI 24.22 kg/m2  SpO2 97% HEENT:  No gross abnormalities Pulmonary: Respirations are non-labored Abdomen: Soft and non-tender  Musculoskeletal: There are no major deformities.   Neurologic: No focal weakness or paresthesias are detected, Skin: There are no ulcer or rashes noted. Psychiatric: The patient has normal affect. Cardiovascular: There is a regular rate and rhythm without significant murmur appreciated. No carotid bruits. Palpable femoral pulses.  Diagnostic Studies: I have reviewed his angiogram. He appears to be a candidate for below knee popliteal bypass graft. I do need to reevaluate his femoral imaging as it is difficult to see in the office today.  Outside Studies/Documentation Historical records were reviewed.  They showed left leg arterial insufficiency as defined by angiogram  Medication Changes: The patient will need to be off of his Pletal 5 days prior to any operation.  Assessment:  Left leg claudication Plan: I discussed the natural history of claudication. I feel that the patient's R. Moore on the superior side as he is having trouble sleeping at night now. He does not have open wounds at this time. We discussed continue with medical management versus intervention. I feel that left femoral to below knee popliteal artery bypass graft is his best option at this point in time. He is going to come back to my office to have vein mapping to see if this can be done with a vein. In addition, I'll also need to review his images at [facility], as it is difficult to evaluate his femoral lesion from my office. We discussed the risks and benefits of the operation including the risk of graft failure, the need for future interventions, the risk of bleeding, the risk of wound  issues, and the recovery time. All was questions were answered he is going to contact me with a surgery date. He will need to be off of his Pletal 5 days prior to his operation.   Initially I had considered simply femoral endarterectomy, but after discussing this with him further, bypass with endarterectomy is likely the best option, however I will wait to see what his vein mapping results look like before making the final decision  V. Wells Aneeka Bowden IV, M.D. [facility] [place] Office: [phone] Pager:  [phone]  ADDENDUM (01/22/12): I spoke with Dr Zayvon Alicea today and patient will either go for fem-pop bypass or femoral endarterectomy. Surgery is planned for tomorrow. He   has no hx of CAD and hasn't had chest pain. EKG shows no ischemic changes. He can proceed with either surgery without further cardiac workup.   Michael Cooper 01/22/2012 9:54 PM  

## 2011-12-30 NOTE — Addendum Note (Signed)
Addended by: Sharee Pimple on: 12/30/2011 03:43 PM   Modules accepted: Orders

## 2011-12-31 ENCOUNTER — Encounter (INDEPENDENT_AMBULATORY_CARE_PROVIDER_SITE_OTHER): Payer: Medicare Other | Admitting: *Deleted

## 2011-12-31 ENCOUNTER — Encounter: Payer: Self-pay | Admitting: Surgery

## 2011-12-31 DIAGNOSIS — I70219 Atherosclerosis of native arteries of extremities with intermittent claudication, unspecified extremity: Secondary | ICD-10-CM

## 2011-12-31 DIAGNOSIS — Z01810 Encounter for preprocedural cardiovascular examination: Secondary | ICD-10-CM

## 2011-12-31 DIAGNOSIS — I739 Peripheral vascular disease, unspecified: Secondary | ICD-10-CM | POA: Diagnosis not present

## 2012-01-01 ENCOUNTER — Encounter: Payer: Self-pay | Admitting: Cardiovascular Disease

## 2012-01-01 NOTE — Telephone Encounter (Signed)
This encounter was created in error - please disregard.

## 2012-01-01 NOTE — Telephone Encounter (Signed)
Please return call to patient to discuss the result of consult with Dr. Myra Gianotti, he can be reached at [phone]

## 2012-01-07 DIAGNOSIS — M779 Enthesopathy, unspecified: Secondary | ICD-10-CM | POA: Diagnosis not present

## 2012-01-07 DIAGNOSIS — M7750 Other enthesopathy of unspecified foot: Secondary | ICD-10-CM | POA: Diagnosis not present

## 2012-01-08 ENCOUNTER — Other Ambulatory Visit: Payer: Self-pay

## 2012-01-09 ENCOUNTER — Encounter (HOSPITAL_COMMUNITY): Payer: Self-pay | Admitting: Pharmacy Technician

## 2012-01-10 ENCOUNTER — Other Ambulatory Visit: Payer: Self-pay

## 2012-01-10 MED ORDER — LISINOPRIL-HYDROCHLOROTHIAZIDE 20-12.5 MG PO TABS: ORAL_TABLET | ORAL | Status: DC

## 2012-01-10 MED ORDER — LEVOTHYROXINE SODIUM 50 MCG PO TABS: ORAL_TABLET | ORAL | Status: DC

## 2012-01-10 MED ORDER — AMLODIPINE BESYLATE 5 MG PO TABS: ORAL_TABLET | ORAL | Status: DC

## 2012-01-15 ENCOUNTER — Encounter (HOSPITAL_COMMUNITY)
Admission: RE | Admit: 2012-01-15 | Discharge: 2012-01-15 | Disposition: A | Discharge status: Home / Self Care | Payer: Medicare Other | Source: Ambulatory Visit | Attending: Surgery | Admitting: Surgery

## 2012-01-15 ENCOUNTER — Encounter (HOSPITAL_COMMUNITY): Payer: Self-pay

## 2012-01-15 DIAGNOSIS — Z01811 Encounter for preprocedural respiratory examination: Secondary | ICD-10-CM | POA: Diagnosis not present

## 2012-01-15 HISTORY — DX: Unspecified osteoarthritis, unspecified site: M19.90

## 2012-01-15 HISTORY — DX: Hypothyroidism, unspecified: E03.9

## 2012-01-15 LAB — PROTIME-INR
INR: 0.97 (ref 0.00–1.49)
Prothrombin Time: 13.1 seconds (ref 11.6–15.2)

## 2012-01-15 LAB — URINALYSIS, ROUTINE W REFLEX MICROSCOPIC
Bilirubin Urine: NEGATIVE
Nitrite: NEGATIVE
Specific Gravity, Urine: 1.007 (ref 1.005–1.030)
pH: 7.5 (ref 5.0–8.0)

## 2012-01-15 LAB — CBC
Platelets: 184 10*3/uL (ref 150–400)
RBC: 5.11 MIL/uL (ref 4.22–5.81)
WBC: 10.3 10*3/uL (ref 4.0–10.5)

## 2012-01-15 LAB — COMPREHENSIVE METABOLIC PANEL
ALT: 20 U/L (ref 0–53)
AST: 17 U/L (ref 0–37)
CO2: 29 mEq/L (ref 19–32)
Calcium: 10.2 mg/dL (ref 8.4–10.5)
Chloride: 93 mEq/L — ABNORMAL LOW (ref 96–112)
GFR calc non Af Amer: >90 mL/min (ref >90)
Sodium: 132 mEq/L — ABNORMAL LOW (ref 135–145)
Total Bilirubin: 0.5 mg/dL (ref 0.3–1.2)

## 2012-01-15 LAB — TYPE AND SCREEN

## 2012-01-15 LAB — SURGICAL PCR SCREEN
MRSA, PCR: NEGATIVE
Staphylococcus aureus: NEGATIVE

## 2012-01-15 LAB — ABO/RH: ABO/RH(D): AB POS

## 2012-01-15 NOTE — Pre-Procedure Instructions (Addendum)
20 Ronnie Snyder  01/15/2012   Your procedure is scheduled on:  01/23/12  Report to [facility] at 830 AM.  Call this number if you have problems the morning of surgery: [phone]   Remember:   Do not eat food or drink:After Midnight.    Take these medicines the morning of surgery with A SIP OF WATER: amlodipine,prevacid, levothyroxine   NO pletal    Do not wear jewelry, make-up or nail polish.  Do not wear lotions, powders, or perfumes. You may wear deodorant.  Do not shave 48 hours prior to surgery. Men may shave face and neck.  Do not bring valuables to the hospital.  Contacts, dentures or bridgework may not be worn into surgery.  Leave suitcase in the car. After surgery it may be brought to your room.  For patients admitted to the hospital, checkout time is 11:00 AM the day of discharge.   Patients discharged the day of surgery will not be allowed to drive home.  Name and phone number of your driver: sandra wife [phone]  Special Instructions: CHG Shower Use Special Wash: 1/2 bottle night before surgery and 1/2 bottle morning of surgery.   Please read over the following fact sheets that you were given: Pain Booklet, Coughing and Deep Breathing, Blood Transfusion Information, MRSA Information and Surgical Site Infection Prevention

## 2012-01-15 NOTE — Progress Notes (Signed)
Note dr Excell Seltzer  Epic 7/13, cath 7/13, ekg 4/13 ,dopplers  4/13

## 2012-01-22 MED ORDER — VANCOMYCIN HCL IN DEXTROSE 1-5 GM/200ML-% IV SOLN
1000.0000 mg | INTRAVENOUS | Status: AC
  Administered 2012-01-23: 1000 mg via INTRAVENOUS
  Filled 2012-01-22: qty 200

## 2012-01-23 ENCOUNTER — Encounter (HOSPITAL_COMMUNITY): Payer: Self-pay | Admitting: *Deleted

## 2012-01-23 ENCOUNTER — Encounter (HOSPITAL_COMMUNITY): Payer: Self-pay | Admitting: Certified Registered"

## 2012-01-23 ENCOUNTER — Encounter (HOSPITAL_COMMUNITY)
Admission: RE | Disposition: A | Discharge status: Home / Self Care | Payer: Self-pay | Source: Ambulatory Visit | Attending: Surgery

## 2012-01-23 ENCOUNTER — Inpatient Hospital Stay (HOSPITAL_COMMUNITY)
Admission: RE | Admit: 2012-01-23 | Discharge: 2012-01-24 | DRG: 238 | Disposition: A | Discharge status: Home / Self Care | Payer: Medicare Other | Source: Ambulatory Visit | Attending: Surgery | Admitting: Surgery

## 2012-01-23 ENCOUNTER — Inpatient Hospital Stay (HOSPITAL_COMMUNITY): Payer: Medicare Other | Admitting: Certified Registered"

## 2012-01-23 DIAGNOSIS — Z7982 Long term (current) use of aspirin: Secondary | ICD-10-CM | POA: Diagnosis not present

## 2012-01-23 DIAGNOSIS — I70209 Unspecified atherosclerosis of native arteries of extremities, unspecified extremity: Secondary | ICD-10-CM | POA: Diagnosis not present

## 2012-01-23 DIAGNOSIS — Z79899 Other long term (current) drug therapy: Secondary | ICD-10-CM | POA: Diagnosis not present

## 2012-01-23 DIAGNOSIS — G8929 Other chronic pain: Secondary | ICD-10-CM | POA: Diagnosis present

## 2012-01-23 DIAGNOSIS — F17200 Nicotine dependence, unspecified, uncomplicated: Secondary | ICD-10-CM | POA: Diagnosis present

## 2012-01-23 DIAGNOSIS — F411 Generalized anxiety disorder: Secondary | ICD-10-CM | POA: Diagnosis present

## 2012-01-23 DIAGNOSIS — E1159 Type 2 diabetes mellitus with other circulatory complications: Secondary | ICD-10-CM | POA: Diagnosis not present

## 2012-01-23 DIAGNOSIS — Z860100 Personal history of colon polyps, unspecified: Secondary | ICD-10-CM

## 2012-01-23 DIAGNOSIS — F329 Major depressive disorder, single episode, unspecified: Secondary | ICD-10-CM | POA: Diagnosis present

## 2012-01-23 DIAGNOSIS — Z888 Allergy status to other drugs, medicaments and biological substances status: Secondary | ICD-10-CM

## 2012-01-23 DIAGNOSIS — Z8249 Family history of ischemic heart disease and other diseases of the circulatory system: Secondary | ICD-10-CM

## 2012-01-23 DIAGNOSIS — Z23 Encounter for immunization: Secondary | ICD-10-CM

## 2012-01-23 DIAGNOSIS — I798 Other disorders of arteries, arterioles and capillaries in diseases classified elsewhere: Secondary | ICD-10-CM | POA: Diagnosis not present

## 2012-01-23 DIAGNOSIS — K219 Gastro-esophageal reflux disease without esophagitis: Secondary | ICD-10-CM | POA: Diagnosis present

## 2012-01-23 DIAGNOSIS — F3289 Other specified depressive episodes: Secondary | ICD-10-CM | POA: Diagnosis present

## 2012-01-23 DIAGNOSIS — I70219 Atherosclerosis of native arteries of extremities with intermittent claudication, unspecified extremity: Secondary | ICD-10-CM | POA: Diagnosis present

## 2012-01-23 DIAGNOSIS — Z880 Allergy status to penicillin: Secondary | ICD-10-CM

## 2012-01-23 DIAGNOSIS — I714 Abdominal aortic aneurysm, without rupture, unspecified: Secondary | ICD-10-CM | POA: Diagnosis present

## 2012-01-23 DIAGNOSIS — E7800 Pure hypercholesterolemia, unspecified: Secondary | ICD-10-CM | POA: Diagnosis present

## 2012-01-23 DIAGNOSIS — I10 Essential (primary) hypertension: Secondary | ICD-10-CM | POA: Diagnosis present

## 2012-01-23 DIAGNOSIS — L259 Unspecified contact dermatitis, unspecified cause: Secondary | ICD-10-CM | POA: Diagnosis present

## 2012-01-23 DIAGNOSIS — I743 Embolism and thrombosis of arteries of the lower extremities: Secondary | ICD-10-CM | POA: Diagnosis not present

## 2012-01-23 HISTORY — PX: FEMORAL ENDARTERECTOMY: SUR606

## 2012-01-23 SURGERY — ENDARTERECTOMY, FEMORAL
Anesthesia: General | Site: Leg Upper | Laterality: Left | Wound class: Clean

## 2012-01-23 MED ORDER — PANTOPRAZOLE SODIUM 40 MG PO TBEC: 40.0000 mg | DELAYED_RELEASE_TABLET | Freq: Every day | ORAL | Status: DC

## 2012-01-23 MED ORDER — GLYCOPYRROLATE 0.2 MG/ML IJ SOLN
INTRAMUSCULAR | Status: DC | PRN
  Administered 2012-01-23: 0.6 mg via INTRAVENOUS

## 2012-01-23 MED ORDER — DOCUSATE SODIUM 100 MG PO CAPS
100.0000 mg | ORAL_CAPSULE | Freq: Every day | ORAL | Status: DC
  Administered 2012-01-24: 100 mg via ORAL
  Filled 2012-01-23: qty 1

## 2012-01-23 MED ORDER — PNEUMOCOCCAL VAC POLYVALENT 25 MCG/0.5ML IJ INJ
0.5000 mL | INJECTION | INTRAMUSCULAR | Status: DC
  Filled 2012-01-23: qty 0.5

## 2012-01-23 MED ORDER — LEVOTHYROXINE SODIUM 50 MCG PO TABS
50.0000 ug | ORAL_TABLET | Freq: Every day | ORAL | Status: DC
  Administered 2012-01-24: 50 ug via ORAL
  Filled 2012-01-23 (×3): qty 1

## 2012-01-23 MED ORDER — ASPIRIN EC 81 MG PO TBEC
81.0000 mg | DELAYED_RELEASE_TABLET | Freq: Every day | ORAL | Status: DC
  Administered 2012-01-24: 81 mg via ORAL
  Filled 2012-01-23: qty 1

## 2012-01-23 MED ORDER — ALUM & MAG HYDROXIDE-SIMETH 200-200-20 MG/5ML PO SUSP: 15.0000 mL | ORAL | Status: DC | PRN

## 2012-01-23 MED ORDER — ONDANSETRON HCL 4 MG/2ML IJ SOLN
INTRAMUSCULAR | Status: DC | PRN
  Administered 2012-01-23: 4 mg via INTRAVENOUS

## 2012-01-23 MED ORDER — MORPHINE SULFATE 2 MG/ML IJ SOLN
2.0000 mg | INTRAMUSCULAR | Status: DC | PRN
  Administered 2012-01-23 – 2012-01-24 (×2): 2 mg via INTRAVENOUS
  Filled 2012-01-23 (×2): qty 1

## 2012-01-23 MED ORDER — NEOSTIGMINE METHYLSULFATE 1 MG/ML IJ SOLN
INTRAMUSCULAR | Status: DC | PRN
  Administered 2012-01-23: 3 mg via INTRAVENOUS

## 2012-01-23 MED ORDER — ACETAMINOPHEN 650 MG RE SUPP: 325.0000 mg | RECTAL | Status: DC | PRN

## 2012-01-23 MED ORDER — LISINOPRIL 20 MG PO TABS
20.0000 mg | ORAL_TABLET | Freq: Every day | ORAL | Status: DC
  Administered 2012-01-24: 20 mg via ORAL
  Filled 2012-01-23 (×3): qty 1

## 2012-01-23 MED ORDER — SODIUM CHLORIDE 0.9 % IR SOLN
Status: DC | PRN
  Administered 2012-01-23: 15:00:00

## 2012-01-23 MED ORDER — KCL IN DEXTROSE-NACL 20-5-0.45 MEQ/L-%-% IV SOLN
INTRAVENOUS | Status: DC
  Administered 2012-01-23: 22:00:00 via INTRAVENOUS
  Filled 2012-01-23 (×3): qty 1000

## 2012-01-23 MED ORDER — HYDROMORPHONE HCL PF 1 MG/ML IJ SOLN: 0.2500 mg | INTRAMUSCULAR | Status: DC | PRN

## 2012-01-23 MED ORDER — PHENYLEPHRINE HCL 10 MG/ML IJ SOLN
INTRAMUSCULAR | Status: DC | PRN
  Administered 2012-01-23 (×5): 100 ug via INTRAVENOUS

## 2012-01-23 MED ORDER — LISINOPRIL-HYDROCHLOROTHIAZIDE 20-12.5 MG PO TABS: 1.0000 | ORAL_TABLET | Freq: Every day | ORAL | Status: DC

## 2012-01-23 MED ORDER — PHENOL 1.4 % MT LIQD
1.0000 | OROMUCOSAL | Status: DC | PRN
  Administered 2012-01-24: 1 via OROMUCOSAL
  Filled 2012-01-23: qty 177

## 2012-01-23 MED ORDER — ENOXAPARIN SODIUM 40 MG/0.4ML ~~LOC~~ SOLN
40.0000 mg | SUBCUTANEOUS | Status: DC
  Administered 2012-01-24: 40 mg via SUBCUTANEOUS
  Filled 2012-01-23 (×3): qty 0.4

## 2012-01-23 MED ORDER — LIDOCAINE HCL (CARDIAC) 20 MG/ML IV SOLN
INTRAVENOUS | Status: DC | PRN
  Administered 2012-01-23: 100 mg via INTRAVENOUS

## 2012-01-23 MED ORDER — LABETALOL HCL 5 MG/ML IV SOLN: 10.0000 mg | INTRAVENOUS | Status: DC | PRN

## 2012-01-23 MED ORDER — GUAIFENESIN-DM 100-10 MG/5ML PO SYRP: 15.0000 mL | ORAL_SOLUTION | ORAL | Status: DC | PRN

## 2012-01-23 MED ORDER — 0.9 % SODIUM CHLORIDE (POUR BTL) OPTIME
TOPICAL | Status: DC | PRN
  Administered 2012-01-23 (×2): 1000 mL

## 2012-01-23 MED ORDER — ALBUMIN HUMAN 5 % IV SOLN
INTRAVENOUS | Status: DC | PRN
  Administered 2012-01-23 (×2): via INTRAVENOUS

## 2012-01-23 MED ORDER — CILOSTAZOL 50 MG PO TABS
50.0000 mg | ORAL_TABLET | Freq: Two times a day (BID) | ORAL | Status: DC
  Administered 2012-01-23 – 2012-01-24 (×2): 50 mg via ORAL
  Filled 2012-01-23 (×4): qty 1

## 2012-01-23 MED ORDER — HEPARIN SODIUM (PORCINE) 1000 UNIT/ML IJ SOLN
INTRAMUSCULAR | Status: DC | PRN
  Administered 2012-01-23: 7000 [IU] via INTRAVENOUS

## 2012-01-23 MED ORDER — POTASSIUM CHLORIDE CRYS ER 20 MEQ PO TBCR: 20.0000 meq | EXTENDED_RELEASE_TABLET | Freq: Once | ORAL | Status: AC | PRN

## 2012-01-23 MED ORDER — METOPROLOL TARTRATE 1 MG/ML IV SOLN: 2.0000 mg | INTRAVENOUS | Status: DC | PRN

## 2012-01-23 MED ORDER — OXYCODONE-ACETAMINOPHEN 5-325 MG PO TABS
1.0000 | ORAL_TABLET | ORAL | Status: DC | PRN
  Administered 2012-01-24: 1 via ORAL
  Filled 2012-01-23: qty 1

## 2012-01-23 MED ORDER — DEXTROSE 5 % IV SOLN
1.5000 g | Freq: Two times a day (BID) | INTRAVENOUS | Status: AC
  Administered 2012-01-23 – 2012-01-24 (×2): 1.5 g via INTRAVENOUS
  Filled 2012-01-23 (×2): qty 1.5

## 2012-01-23 MED ORDER — THROMBIN 20000 UNITS EX SOLR
CUTANEOUS | Status: DC | PRN
  Administered 2012-01-23: 18:00:00 via TOPICAL

## 2012-01-23 MED ORDER — SODIUM CHLORIDE 0.9 % IV SOLN: 500.0000 mL | Freq: Once | INTRAVENOUS | Status: AC | PRN

## 2012-01-23 MED ORDER — SODIUM CHLORIDE 0.9 % IV SOLN: INTRAVENOUS | Status: DC

## 2012-01-23 MED ORDER — HYDROCHLOROTHIAZIDE 12.5 MG PO CAPS
12.5000 mg | ORAL_CAPSULE | Freq: Every day | ORAL | Status: DC
  Administered 2012-01-24: 12.5 mg via ORAL
  Filled 2012-01-23 (×3): qty 1

## 2012-01-23 MED ORDER — HYDRALAZINE HCL 20 MG/ML IJ SOLN: 10.0000 mg | INTRAMUSCULAR | Status: DC | PRN

## 2012-01-23 MED ORDER — LACTATED RINGERS IV SOLN
INTRAVENOUS | Status: DC | PRN
  Administered 2012-01-23 (×3): via INTRAVENOUS

## 2012-01-23 MED ORDER — AMLODIPINE BESYLATE 5 MG PO TABS
5.0000 mg | ORAL_TABLET | Freq: Every day | ORAL | Status: DC
  Administered 2012-01-24: 5 mg via ORAL
  Filled 2012-01-23: qty 1

## 2012-01-23 MED ORDER — PROPOFOL 10 MG/ML IV EMUL
INTRAVENOUS | Status: DC | PRN
  Administered 2012-01-23: 180 mg via INTRAVENOUS

## 2012-01-23 MED ORDER — THROMBIN 20000 UNITS EX SOLR
CUTANEOUS | Status: AC
  Filled 2012-01-23: qty 20000

## 2012-01-23 MED ORDER — ONDANSETRON HCL 4 MG/2ML IJ SOLN: 4.0000 mg | Freq: Four times a day (QID) | INTRAMUSCULAR | Status: DC | PRN

## 2012-01-23 MED ORDER — EPHEDRINE SULFATE 50 MG/ML IJ SOLN
INTRAMUSCULAR | Status: DC | PRN
  Administered 2012-01-23 (×5): 5 mg via INTRAVENOUS

## 2012-01-23 MED ORDER — ACETAMINOPHEN 325 MG PO TABS: 325.0000 mg | ORAL_TABLET | ORAL | Status: DC | PRN

## 2012-01-23 MED ORDER — ASPIRIN 81 MG PO TABS: 81.0000 mg | ORAL_TABLET | ORAL | Status: DC

## 2012-01-23 MED ORDER — DOPAMINE-DEXTROSE 3.2-5 MG/ML-% IV SOLN: 3.0000 ug/kg/min | INTRAVENOUS | Status: DC

## 2012-01-23 MED ORDER — FENTANYL CITRATE 0.05 MG/ML IJ SOLN
INTRAMUSCULAR | Status: DC | PRN
  Administered 2012-01-23 (×2): 100 ug via INTRAVENOUS
  Administered 2012-01-23 (×2): 50 ug via INTRAVENOUS

## 2012-01-23 MED ORDER — MAGNESIUM SULFATE 40 MG/ML IJ SOLN
2.0000 g | Freq: Once | INTRAMUSCULAR | Status: AC | PRN
  Filled 2012-01-23: qty 50

## 2012-01-23 MED ORDER — PROTAMINE SULFATE 10 MG/ML IV SOLN
INTRAVENOUS | Status: DC | PRN
  Administered 2012-01-23 (×4): 10 mg via INTRAVENOUS

## 2012-01-23 MED ORDER — MIDAZOLAM HCL 5 MG/5ML IJ SOLN
INTRAMUSCULAR | Status: DC | PRN
  Administered 2012-01-23: 2 mg via INTRAVENOUS

## 2012-01-23 MED ORDER — ROCURONIUM BROMIDE 100 MG/10ML IV SOLN
INTRAVENOUS | Status: DC | PRN
  Administered 2012-01-23: 50 mg via INTRAVENOUS
  Administered 2012-01-23: 20 mg via INTRAVENOUS

## 2012-01-23 SURGICAL SUPPLY — 65 items
ADH SKN CLS APL DERMABOND .7 (GAUZE/BANDAGES/DRESSINGS) ×2
BANDAGE ELASTIC 4 VELCRO ST LF (GAUZE/BANDAGES/DRESSINGS) IMPLANT
BANDAGE ESMARK 6X9 LF (GAUZE/BANDAGES/DRESSINGS) IMPLANT
BNDG CMPR 9X6 STRL LF SNTH (GAUZE/BANDAGES/DRESSINGS)
BNDG ESMARK 6X9 LF (GAUZE/BANDAGES/DRESSINGS)
CANISTER SUCTION 2500CC (MISCELLANEOUS) ×3 IMPLANT
CATH EMB 4FR 40CM (CATHETERS) ×3 IMPLANT
CLIP TI MEDIUM 24 (CLIP) ×3 IMPLANT
CLIP TI WIDE RED SMALL 24 (CLIP) ×3 IMPLANT
CLOTH BEACON ORANGE TIMEOUT ST (SAFETY) ×3 IMPLANT
COVER SURGICAL LIGHT HANDLE (MISCELLANEOUS) ×3 IMPLANT
CUFF TOURNIQUET SINGLE 24IN (TOURNIQUET CUFF) IMPLANT
CUFF TOURNIQUET SINGLE 34IN LL (TOURNIQUET CUFF) IMPLANT
CUFF TOURNIQUET SINGLE 44IN (TOURNIQUET CUFF) IMPLANT
DERMABOND ADVANCED (GAUZE/BANDAGES/DRESSINGS) ×1
DERMABOND ADVANCED .7 DNX12 (GAUZE/BANDAGES/DRESSINGS) ×2 IMPLANT
DRAIN CHANNEL 15F RND FF W/TCR (WOUND CARE) IMPLANT
DRAPE WARM FLUID 44X44 (DRAPE) ×3 IMPLANT
DRAPE X-RAY CASS 24X20 (DRAPES) IMPLANT
DRSG COVADERM 4X10 (GAUZE/BANDAGES/DRESSINGS) IMPLANT
DRSG COVADERM 4X8 (GAUZE/BANDAGES/DRESSINGS) IMPLANT
ELECT REM PT RETURN 9FT ADLT (ELECTROSURGICAL) ×3
ELECTRODE REM PT RTRN 9FT ADLT (ELECTROSURGICAL) ×2 IMPLANT
EVACUATOR SILICONE 100CC (DRAIN) IMPLANT
GLOVE BIO SURGEON STRL SZ 6.5 (GLOVE) ×6 IMPLANT
GLOVE BIO SURGEON STRL SZ7.5 (GLOVE) ×3 IMPLANT
GLOVE BIOGEL PI IND STRL 7.5 (GLOVE) ×6 IMPLANT
GLOVE BIOGEL PI INDICATOR 7.5 (GLOVE) ×3
GLOVE SS BIOGEL STRL SZ 7.5 (GLOVE) ×2 IMPLANT
GLOVE SUPERSENSE BIOGEL SZ 7.5 (GLOVE) ×1
GLOVE SURG SS PI 7.5 STRL IVOR (GLOVE) ×6 IMPLANT
GOWN PREVENTION PLUS XXLARGE (GOWN DISPOSABLE) ×3 IMPLANT
GOWN STRL NON-REIN LRG LVL3 (GOWN DISPOSABLE) ×9 IMPLANT
HEMOSTAT SNOW SURGICEL 2X4 (HEMOSTASIS) IMPLANT
HEMOSTAT SURGICEL 2X14 (HEMOSTASIS) IMPLANT
KIT BASIN OR (CUSTOM PROCEDURE TRAY) ×3 IMPLANT
KIT ROOM TURNOVER OR (KITS) ×3 IMPLANT
MARKER GRAFT CORONARY BYPASS (MISCELLANEOUS) IMPLANT
NS IRRIG 1000ML POUR BTL (IV SOLUTION) ×6 IMPLANT
PACK PERIPHERAL VASCULAR (CUSTOM PROCEDURE TRAY) ×3 IMPLANT
PAD ARMBOARD 7.5X6 YLW CONV (MISCELLANEOUS) ×6 IMPLANT
PADDING CAST COTTON 6X4 STRL (CAST SUPPLIES) IMPLANT
PATCH VASCULAR VASCU GUARD 1X6 (Vascular Products) ×3 IMPLANT
SET COLLECT BLD 21X3/4 12 (NEEDLE) IMPLANT
SPONGE SURGIFOAM ABS GEL 100C (HEMOSTASIS) ×3 IMPLANT
STAPLER VISISTAT 35W (STAPLE) IMPLANT
STOPCOCK 4 WAY LG BORE MALE ST (IV SETS) ×3 IMPLANT
SUT ETHILON 3 0 PS 1 (SUTURE) IMPLANT
SUT PROLENE 5 0 C 1 24 (SUTURE) ×6 IMPLANT
SUT PROLENE 6 0 BV (SUTURE) ×6 IMPLANT
SUT PROLENE 7 0 BV 1 (SUTURE) IMPLANT
SUT SILK 3 0 (SUTURE)
SUT SILK 3-0 18XBRD TIE 12 (SUTURE) IMPLANT
SUT VIC AB 2-0 CT1 27 (SUTURE) ×6
SUT VIC AB 2-0 CT1 TAPERPNT 27 (SUTURE) ×4 IMPLANT
SUT VIC AB 3-0 SH 27 (SUTURE) ×6
SUT VIC AB 3-0 SH 27X BRD (SUTURE) ×4 IMPLANT
SUT VICRYL 4-0 PS2 18IN ABS (SUTURE) ×6 IMPLANT
SYR 3ML LL SCALE MARK (SYRINGE) ×3 IMPLANT
TOWEL OR 17X24 6PK STRL BLUE (TOWEL DISPOSABLE) ×6 IMPLANT
TOWEL OR 17X26 10 PK STRL BLUE (TOWEL DISPOSABLE) ×6 IMPLANT
TRAY FOLEY CATH 14FRSI W/METER (CATHETERS) ×3 IMPLANT
TUBING EXTENTION W/L.L. (IV SETS) IMPLANT
UNDERPAD 30X30 INCONTINENT (UNDERPADS AND DIAPERS) ×3 IMPLANT
WATER STERILE IRR 1000ML POUR (IV SOLUTION) ×3 IMPLANT

## 2012-01-23 NOTE — Interval H&P Note (Signed)
History and Physical Interval Note:  01/23/2012 2:57 PM  Ronnie Snyder  has presented today for surgery, with the diagnosis of Peripheral Vascular Disease  The various methods of treatment have been discussed with the patient and family. After consideration of risks, benefits and other options for treatment, the patient has consented to LEFT FEMORAL ENDARTERECTOMY, POSSIBLE LEFT FEMORAL POPLITEAL ARTERY BYPASS. The patient's history has been reviewed, patient examined, no change in status, stable for surgery.  I have reviewed the patient's chart and labs.  Questions were answered to the patient's satisfaction.     DICKSON,CHRISTOPHER S

## 2012-01-23 NOTE — Anesthesia Procedure Notes (Signed)
Procedure Name: Intubation Date/Time: 01/23/2012 3:41 PM Performed by: Esly Selvage S Pre-anesthesia Checklist: Patient identified, Emergency Drugs available, Suction available, Patient being monitored and Timeout performed Patient Re-evaluated:Patient Re-evaluated prior to inductionOxygen Delivery Method: Circle system utilized Preoxygenation: Pre-oxygenation with 100% oxygen Intubation Type: IV induction Ventilation: Mask ventilation without difficulty Laryngoscope Size: Mac and 4 Grade View: Grade I Tube type: Oral Tube size: 7.5 mm Number of attempts: 1 Airway Equipment and Method: Stylet Placement Confirmation: ETT inserted through vocal cords under direct vision,  positive ETCO2 and breath sounds checked- equal and bilateral Secured at: 22 cm Tube secured with: Tape Dental Injury: Teeth and Oropharynx as per pre-operative assessment

## 2012-01-23 NOTE — Preoperative (Signed)
Beta Blockers   Reason not to administer Beta Blockers:Not Applicable 

## 2012-01-23 NOTE — Anesthesia Postprocedure Evaluation (Signed)
  Anesthesia Post-op Note  Patient: Ronnie Snyder  Procedure(s) Performed: Procedure(s) (LRB) with comments: ENDARTERECTOMY FEMORAL (Left) - Left Femoral Endarterectomy with bovine  patch angioplasty  Patient Location: PACU  Anesthesia Type: General  Level of Consciousness: awake  Airway and Oxygen Therapy: Patient Spontanous Breathing  Post-op Pain: mild  Post-op Assessment: Post-op Vital signs reviewed  Post-op Vital Signs: stable  Complications: No apparent anesthesia complications

## 2012-01-23 NOTE — Progress Notes (Signed)
Pt. Informed that OR is ready for him now.  Pt. Offers no complaints.

## 2012-01-23 NOTE — Op Note (Signed)
NAME: Ronnie Snyder   MRN: [id number] DOB: 1940-11-28    DATE OF OPERATION: 01/23/2012  PREOP DIAGNOSIS: disabling claudication of left lower extremity  POSTOP DIAGNOSIS: same  PROCEDURE: Left iliofemoral endarterectomy with bovine pericardial patch angioplasty  SURGEON: Di Kindle. Edilia Bo, MD, FACS  ASSIST: Lianne Cure PA  ANESTHESIA: Gen.   EBL: minimal  INDICATIONS: Ronnie Snyder is a 71 y.o. male who presented with disabling claudication of the left lower extremity. He had diffuse multilevel disease. He had a bulky calcific plaque in the common femoral artery and also in the proximal superficial femoral artery. We felt that by addressing these lesions this would improve his circulation enough to significantly improve his symptoms.  FINDINGS: diffuse calcific disease throughout the iliac artery, femoral artery, and superficial femoral artery.  TECHNIQUE: The patient was brought to the operating room and received a general anesthetic. The left lower extremity was prepped and draped in usual sterile fashion. A longitudinal incision was made in the left groin and through this incision the common femoral artery was dissected free. In order to find a soft spot on the external iliac artery that could be clamped I did dissect well beneath the inguinal ligament. Branches were then controlled with vessel loops. I then controlled the deep femoral artery with 2 loops and then the superficial femoral artery was controlled. The superficial femoral artery was diffusely calcific and diseased with really no spot that could be clamped. Therefore I elected to control the superficial femoral artery with a Fogarty catheter. The patient was heparinized. A clamp was placed on the external iliac artery well beneath the inguinal ligament and then the remaining branches were controlled with vessel loops. The artery was opened longitudinally and then a 4 Fogarty catheter and passed into the  superficial femoral artery and the balloon inflated for distal control. An endarterectomy plane was established. The plaque was markedly calcified. I extended the endarterectomy well beyond the arteriotomy would appear to be a reasonable tapering of the plaque. The large calcific plaque which had been identified on to arteriography was incorporated within the endarterectomized specimen. Next distal endarterectomy was performed. I was able to get beneath the area of the bulky plaque within the proximal superficial femoral artery. The plaque was sharply divided distally and then 2 tacking sutures were placed with 6-0 Prolene. The artery was then cleared of all loose debris and irrigated with saline. Bovine pericardial patch had been soaked appropriately and sewn as a patch using 2 continuous 6-0 Prolene sutures. Prior to completing the patch closure the arteries were backbled and flushed properly and then the anastomosis completed. Flow was reestablished first to the deep femoral artery and then to the superficial femoral artery. At the completion was a good signal in the superficial femoral artery with good diastolic flow. There was a posterior tibial and anterior tibial signal with the Doppler. The heparin was partially reversed with protamine. The wound was then closed with deep layer of 2-0 Vicryl. The subcutaneous layer was closed with 3-0 Vicryl. The skin was closed with 4-0 subcuticular stitch. Dermabond was applied. The patient tolerated the procedure well and transferred to the recovery room in stable condition. All needle and sponge counts were correct.  Ronnie Ferrari, MD, FACS [facility]ists of [facility]  DATE OF DICTATION:   01/23/2012

## 2012-01-23 NOTE — Transfer of Care (Signed)
Immediate Anesthesia Transfer of Care Note  Patient: Ronnie Snyder  Procedure(s) Performed: Procedure(s) (LRB) with comments: ENDARTERECTOMY FEMORAL (Left) - Left Femoral Endarterectomy with bovine  patch angioplasty  Patient Location: PACU  Anesthesia Type: General  Level of Consciousness: awake, alert  and oriented  Airway & Oxygen Therapy: Patient Spontanous Breathing and Patient connected to nasal cannula oxygen  Post-op Assessment: Report given to PACU RN and Post -op Vital signs reviewed and stable  Post vital signs: Reviewed and stable  Complications: No apparent anesthesia complications

## 2012-01-23 NOTE — Anesthesia Preprocedure Evaluation (Addendum)
Anesthesia Evaluation  Patient identified by MRN, date of birth, ID band Patient awake    Reviewed: Allergy & Precautions, H&P , NPO status , Patient's Chart, lab work & pertinent test results  Airway Mallampati: I  Neck ROM: Full    Dental  (+) Teeth Intact, Edentulous Upper and Dental Advisory Given   Pulmonary neg pulmonary ROS, Current Smoker,  breath sounds clear to auscultation        Cardiovascular hypertension, Pt. on medications     Neuro/Psych PSYCHIATRIC DISORDERS Anxiety Depression negative neurological ROS     GI/Hepatic Neg liver ROS, GERD-  Controlled and Medicated,  Endo/Other  Hypothyroidism   Renal/GU negative Renal ROS     Musculoskeletal  (+) Arthritis -, Osteoarthritis,    Abdominal   Peds  Hematology negative hematology ROS (+)   Anesthesia Other Findings   Reproductive/Obstetrics                         Anesthesia Physical Anesthesia Plan  ASA: III  Anesthesia Plan: General   Post-op Pain Management:    Induction: Intravenous  Airway Management Planned: Oral ETT  Additional Equipment:   Intra-op Plan:   Post-operative Plan: Extubation in OR  Informed Consent: I have reviewed the patients History and Physical, chart, labs and discussed the procedure including the risks, benefits and alternatives for the proposed anesthesia with the patient or authorized representative who has indicated his/her understanding and acceptance.   Dental advisory given  Plan Discussed with: CRNA, Anesthesiologist and Surgeon  Anesthesia Plan Comments:        Anesthesia Quick Evaluation

## 2012-01-23 NOTE — H&P (View-Only) (Signed)
Vascular and Vein Specialist of [facility]   Patient name: Ronnie Snyder MRN: [id number] DOB: 1941/04/18 Sex: male   Referred by: Dr. Excell Seltzer  Reason for referral:  Chief Complaint  Patient presents with  . PVD    consult for fem pop     HISTORY OF PRESENT ILLNESS: The patient is referred today for evaluation of left leg claudication. He is sent to me by Dr. Excell Seltzer who has preformed angiography and found disease that would be best treated operatively. He has been seen in our office previously by Dr. Hart Rochester for evaluation of a small abdominal aortic aneurysm. He is not having a followed here currently. The patient states that for many years he has had symptoms in his left leg. They have become much more severe. He now describes what sounds like rest pain at night. He gets cramping with minimal activity. He continues to smoke. He does not have any ulcerations. He has tried Pletal but had difficulty tolerating this do to dizziness. He is now taking a half a dose.  The patient is medically managed for his diabetes and hypertension.  Past Medical History  Diagnosis Date  . Anxiety   . Hx of colonic polyps   . Depression   . GERD (gastroesophageal reflux disease)   . HTN (hypertension)   . Hepatic steatosis   . Abdominal aortic aneurysm     3 cm aneurysm followed with serial ultrasound    Past Surgical History  Procedure Date  . Inguinal hernia repair   . Tonsillectomy   . Esophagogastroduodenoscopy   . Gated spect wall motion stress cardiolite 02/10/2002  . Nose surgery     History   Social History  . Marital Status: Married    Spouse Name: N/A    Number of Children: N/A  . Years of Education: N/A   Occupational History  . retired     Sales executive   Social History Main Topics  . Smoking status: Current Everyday Smoker -- 1.0 packs/day for 50 years    Types: Cigarettes  . Smokeless tobacco: Never Used   Comment: pt states there is no hope for quitting  . Alcohol  Use: Yes     6 pack every 4 days  . Drug Use: No  . Sexually Active: Not on file   Other Topics Concern  . Not on file   Social History Narrative  . No narrative on file    Family History  Problem Relation Age of Onset  . Hypertension Mother   . Hypertension Father   . Hypertension Sister   . Hypertension Sister     Allergies as of 12/30/2011 - Review Complete 12/30/2011  Allergen Reaction Noted  . Atorvastatin    . Penicillins    . Pioglitazone      Current Outpatient Prescriptions on File Prior to Visit  Medication Sig Dispense Refill  . amLODipine (NORVASC) 5 MG tablet take 1 tablet by mouth once daily  90 tablet  3  . aspirin 81 MG tablet Take 81 mg by mouth every other day.       . Cholecalciferol (VITAMIN D-3 PO) Take by mouth 2 (two) times a week.      . cilostazol (PLETAL) 100 MG tablet Take 0.5 tablets (50 mg total) by mouth 2 (two) times daily.  1 tablet  0  . lansoprazole (PREVACID) 30 MG capsule Take 30 mg by mouth every other day.      . levothyroxine (UNITHROID) 50 MCG tablet take  1 tablet by mouth once daily  90 tablet  3  . lisinopril-hydrochlorothiazide (PRINZIDE,ZESTORETIC) 20-12.5 MG per tablet take 2 tablets by mouth once daily  180 tablet  3  . magnesium oxide (MAG-OX) 400 MG tablet Take 400 mg by mouth daily.       Marland Kitchen triamcinolone cream (KENALOG) 0.1 % Apply 1 application topically 2 (two) times daily.          REVIEW OF SYSTEMS: Cardiovascular: No chest pain, chest pressure, palpitations, orthopnea, or dyspnea on exertion,  No history of DVT or phlebitis. Pulmonary: No productive cough, asthma or wheezing. Neurologic: No weakness, paresthesias, aphasia, or amaurosis. No dizziness. Hematologic: No bleeding problems or clotting disorders. Musculoskeletal: No joint pain or joint swelling. Gastrointestinal: No blood in stool or hematemesis Genitourinary: No dysuria or hematuria. Psychiatric:: No history of major depression. Integumentary: No rashes  or ulcers. Constitutional: No fever or chills.  PHYSICAL EXAMINATION: General: The patient appears their stated age.  Vital signs are BP 133/77  Pulse 77  Resp 16  Ht 5\' 9"  (1.753 m)  Wt 164 lb (74.39 kg)  BMI 24.22 kg/m2  SpO2 97% HEENT:  No gross abnormalities Pulmonary: Respirations are non-labored Abdomen: Soft and non-tender  Musculoskeletal: There are no major deformities.   Neurologic: No focal weakness or paresthesias are detected, Skin: There are no ulcer or rashes noted. Psychiatric: The patient has normal affect. Cardiovascular: There is a regular rate and rhythm without significant murmur appreciated. No carotid bruits. Palpable femoral pulses.  Diagnostic Studies: I have reviewed his angiogram. He appears to be a candidate for below knee popliteal bypass graft. I do need to reevaluate his femoral imaging as it is difficult to see in the office today.  Outside Studies/Documentation Historical records were reviewed.  They showed left leg arterial insufficiency as defined by angiogram  Medication Changes: The patient will need to be off of his Pletal 5 days prior to any operation.  Assessment:  Left leg claudication Plan: I discussed the natural history of claudication. I feel that the patient's R. Moore on the superior side as he is having trouble sleeping at night now. He does not have open wounds at this time. We discussed continue with medical management versus intervention. I feel that left femoral to below knee popliteal artery bypass graft is his best option at this point in time. He is going to come back to my office to have vein mapping to see if this can be done with a vein. In addition, I'll also need to review his images at [facility], as it is difficult to evaluate his femoral lesion from my office. We discussed the risks and benefits of the operation including the risk of graft failure, the need for future interventions, the risk of bleeding, the risk of wound  issues, and the recovery time. All was questions were answered he is going to contact me with a surgery date. He will need to be off of his Pletal 5 days prior to his operation.   Initially I had considered simply femoral endarterectomy, but after discussing this with him further, bypass with endarterectomy is likely the best option, however I will wait to see what his vein mapping results look like before making the final decision  V. Charlena Cross, M.D. [facility] [place] Office: [phone] Pager:  [phone]  ADDENDUM (01/22/12): I spoke with Dr Myra Gianotti today and patient will either go for fem-pop bypass or femoral endarterectomy. Surgery is planned for tomorrow. He  has no hx of CAD and hasn't had chest pain. EKG shows no ischemic changes. He can proceed with either surgery without further cardiac workup.   [name] 01/22/2012 9:54 PM

## 2012-01-24 ENCOUNTER — Telehealth: Payer: Self-pay | Admitting: Vascular Surgery

## 2012-01-24 LAB — BASIC METABOLIC PANEL
BUN: 9 mg/dL (ref 6–23)
CO2: 30 mEq/L (ref 19–32)
Chloride: 97 mEq/L (ref 96–112)
Creatinine, Ser: 0.66 mg/dL (ref 0.50–1.35)
Glucose, Bld: 118 mg/dL — ABNORMAL HIGH (ref 70–99)

## 2012-01-24 LAB — CBC
HCT: 35.6 % — ABNORMAL LOW (ref 39.0–52.0)
MCV: 86.4 fL (ref 78.0–100.0)
RBC: 4.12 MIL/uL — ABNORMAL LOW (ref 4.22–5.81)
WBC: 9.4 10*3/uL (ref 4.0–10.5)

## 2012-01-24 MED ORDER — OXYCODONE HCL 5 MG PO TABS: 5.0000 mg | ORAL_TABLET | Freq: Four times a day (QID) | ORAL | Status: AC | PRN

## 2012-01-24 NOTE — Progress Notes (Signed)
01/23/2012 2000  Pt admitted to 3316 from PACU. Pt is alert and oriented X4. VSS. L DP and PT are dopperable and L groin is a level 0. Family is at bedside. Pain level is controlled. Will continue to monitor.  [place] Helia Haese, RN

## 2012-01-24 NOTE — Telephone Encounter (Addendum)
Message copied by Rosalyn Charters on Fri Jan 24, 2012  2:01 PM ------      Message from: Sharee Pimple      Created: Fri Jan 24, 2012 10:53 AM      Regarding: schedule                   ----- Message -----         From: Dara Lords, PA         Sent: 01/24/2012  10:39 AM           To: Sharee Pimple, CMA            Please change the f/u of this pt to [facility] instead of [facility].      In 2 weeks.      Thanks!  please change the fu of this pt. to [facility] instead of [facility] as per staff message 01-24-12

## 2012-01-24 NOTE — Progress Notes (Signed)
01/24/2012 11:27 AM  Discussed discharge instructions and medications with patient. Answered all questions. Vss. Pt has no complaints of pain. Surgical site is ota with dermabond,WNL. PIV is dc'ed with tip intact. Pt is discharged home with family, waiting on ride to show.  Ronnie Snyder 9/6/201311:27 AM

## 2012-01-24 NOTE — Progress Notes (Signed)
PT  Cancellation Note     Evaluation cancelled  due to talked with pt, observed his mobility and we mutually agreed that pt had no PT needs at this time.  D/C from PT. 01/24/2012  Waller Bing, PT [phone] (pager)

## 2012-01-24 NOTE — Discharge Summary (Signed)
Agree with plans for D/C today.  Di Kindle. Edilia Bo, MD, FACS Beeper [phone] 01/24/2012

## 2012-01-24 NOTE — Progress Notes (Signed)
Utilization review completed- retro 

## 2012-01-24 NOTE — Progress Notes (Signed)
01/24/12 0600  Pt's foley removed at 0600 with no complications. Pt ambulated 100 feet in the hall, on room air, with no issues. VSS. Will continue to monitor.  [place] Shaketa Serafin, RN

## 2012-01-24 NOTE — Progress Notes (Signed)
Occupational Therapy Discharge Patient Details Name: Ronnie Snyder MRN: [id number] DOB: 08/15/1940 Today's Date: 01/24/2012 Time:  -     Patient discharged from OT services secondary to goals met and no further OT needs identified. Pt is at or near baseline  Please see latest therapy progress note for current level of functioning and progress toward goals.    Progress and discharge plan discussed with patient and/or caregiver: Patient/Caregiver agrees with plan  GO     Lucile Shutters 01/24/2012, 2:22 PM

## 2012-01-24 NOTE — Telephone Encounter (Addendum)
Message copied by Rosalyn Charters on Fri Jan 24, 2012  9:37 AM ------      Message from: Sharee Pimple      Created: Fri Jan 24, 2012  8:15 AM      Regarding: schedule                   ----- Message -----         From: Dara Lords, PA         Sent: 01/24/2012   7:43 AM           To: Sharee Pimple, CMA             Left iliofemoral endarterectomy with bovine pericardial patch angioplasty       by [facility] 01/24/12            F/u with him in 2 weeks.            Thanks,      Samantha  notified pt. of fu appt. 02-12-12 at 11:15am with dr. Edilia Bo l/v/m and mailed appt. letter

## 2012-01-24 NOTE — Progress Notes (Signed)
[facility] Progress Note  01/24/2012 7:32 AM POD 1  Subjective:  A little sore in the groin-otherwise, wants to go home.  States he has walked around unit this am.  Afebrile x 24 hrs VSS Filed Vitals:   01/24/12 0400  BP:   Pulse: 68  Temp:   Resp: 20    Physical Exam: Incisions:  C/d/i left groin. Extremities:  + palpable DP and PT on the left.  No edema.  CBC    Component Value Date/Time   WBC 9.4 01/24/2012 0310   RBC 4.12* 01/24/2012 0310   HGB 12.1* 01/24/2012 0310   HCT 35.6* 01/24/2012 0310   PLT 140* 01/24/2012 0310   MCV 86.4 01/24/2012 0310   MCH 29.4 01/24/2012 0310   MCHC 34.0 01/24/2012 0310   RDW 13.2 01/24/2012 0310   LYMPHSABS 2.5 11/27/2011 0921   MONOABS 0.7 11/27/2011 0921   EOSABS 0.3 11/27/2011 0921   BASOSABS 0.1 11/27/2011 0921    BMET    Component Value Date/Time   NA 135 01/24/2012 0310   K 3.4* 01/24/2012 0310   CL 97 01/24/2012 0310   CO2 30 01/24/2012 0310   GLUCOSE 118* 01/24/2012 0310   BUN 9 01/24/2012 0310   CREATININE 0.66 01/24/2012 0310   CALCIUM 9.1 01/24/2012 0310   GFRNONAA >90 01/24/2012 0310   GFRAA >90 01/24/2012 0310    INR    Component Value Date/Time   INR 0.97 01/15/2012 0959     Intake/Output Summary (Last 24 hours) at 01/24/12 0732 Last data filed at 01/24/12 0600  Gross per 24 hour  Intake   3820 ml  Output   2800 ml  Net   1020 ml     Assessment/Plan:  71 y.o. male is s/p Left iliofemoral endarterectomy with bovine pericardial patch angioplasty  POD 1  -pt doing well this am. -possible d/c home later this am.  Will d/w Dr. Edilia Bo.  Doreatha Massed, PA-C [facility] [phone] 01/24/2012 7:32 AM

## 2012-01-24 NOTE — Discharge Summary (Signed)
[facility] Discharge Summary  Ronnie Snyder [birth year] 71 y.o. male  [id number]  Admission Date: 01/23/2012  Discharge Date: 01/24/12  Physician: Ronnie Libman, MD  Admission Diagnosis: Peripheral Vascular Disease   HPI:   This is a 71 y.o. male referred today for evaluation of left leg claudication. He is sent to me by Dr. Excell Snyder who has preformed angiography and found disease that would be best treated operatively. He has been seen in our office previously by Dr. Hart Snyder for evaluation of a small abdominal aortic aneurysm. He is not having a followed here currently. The patient states that for many years he has had symptoms in his left leg. They have become much more severe. He now describes what sounds like rest pain at night. He gets cramping with minimal activity. He continues to smoke. He does not have any ulcerations. He has tried Pletal but had difficulty tolerating this do to dizziness. He is now taking a half a dose.  The patient is medically managed for his diabetes and hypertension.  Hospital Course:  The patient was admitted to the hospital and taken to the operating room on 01/23/2012 and underwent Left iliofemoral endarterectomy with bovine pericardial patch angioplasty.  The pt tolerated the procedure well and was transported to the PACU in good condition. By POD 1, he was doing well and had mobilized.  The remainder of the hospital course consisted of increasing mobilization and increasing intake of solids without difficulty.  CBC    Component Value Date/Time   WBC 9.4 01/24/2012 0310   RBC 4.12* 01/24/2012 0310   HGB 12.1* 01/24/2012 0310   HCT 35.6* 01/24/2012 0310   PLT 140* 01/24/2012 0310   MCV 86.4 01/24/2012 0310   MCH 29.4 01/24/2012 0310   MCHC 34.0 01/24/2012 0310   RDW 13.2 01/24/2012 0310   LYMPHSABS 2.5 11/27/2011 0921   MONOABS 0.7 11/27/2011 0921   EOSABS 0.3 11/27/2011 0921   BASOSABS 0.1 11/27/2011 0921    BMET    Component Value  Date/Time   NA 135 01/24/2012 0310   K 3.4* 01/24/2012 0310   CL 97 01/24/2012 0310   CO2 30 01/24/2012 0310   GLUCOSE 118* 01/24/2012 0310   BUN 9 01/24/2012 0310   CREATININE 0.66 01/24/2012 0310   CALCIUM 9.1 01/24/2012 0310   GFRNONAA >90 01/24/2012 0310   GFRAA >90 01/24/2012 0310     Discharge Instructions:   The patient is discharged to home with extensive instructions on wound care and progressive ambulation.  They are instructed not to drive or perform any heavy lifting until returning to see the physician in his office.  Discharge Orders    Future Orders Please Complete By Expires   Resume previous diet      Driving Restrictions      Comments:   No driving for 2 weeks   Lifting restrictions      Comments:   No lifting for 6 weeks   Call MD for:  temperature >100.5      Call MD for:  redness, tenderness, or signs of infection (pain, swelling, bleeding, redness, odor or green/yellow discharge around incision site)      Call MD for:  severe or increased pain, loss or decreased feeling  in affected limb(s)      Discharge wound care:      Comments:   Shower daily with soap and water starting 01/25/12      Discharge Diagnosis:  Peripheral Vascular Disease  Secondary Diagnosis: Patient Active Problem List  Diagnosis  . HOMOCYSTINEMIA  . HYPERCHOLESTEROLEMIA  . ANXIETY  . ERECTILE DYSFUNCTION  . SMOKER  . DEPRESSION  . HYPERTENSION  . CAROTID ARTERY STENOSIS  . AAA  . GERD  . ERECTILE DYSFUNCTION, ORGANIC  . ECZEMA  . SEBACEOUS CYST  . HIP PAIN, RIGHT  . LOW BACK PAIN, CHRONIC  . COUGH  . COLONIC POLYPS, HX OF  . ADENOIDECTOMY, HX OF  . Atherosclerosis of native arteries of the extremities with intermittent claudication  . Type II or unspecified type diabetes mellitus with peripheral circulatory disorders, not stated as uncontrolled   Past Medical History  Diagnosis Date  . Hx of colonic polyps   . GERD (gastroesophageal reflux disease)   . HTN (hypertension)   .  Hepatic steatosis   . Abdominal aortic aneurysm     3 cm aneurysm followed with serial ultrasound  . Hypothyroidism   . Arthritis       Ronnie Snyder, Ronnie Snyder  Home Medication Instructions ZOX:[id number]   Printed on:01/24/12 0739  Medication Information                    aspirin 81 MG tablet Take 81 mg by mouth every other day.            cilostazol (PLETAL) 100 MG tablet Take 0.5 tablets (50 mg total) by mouth 2 (two) times daily.           lansoprazole (PREVACID) 30 MG capsule Take 30 mg by mouth every other day.           Cholecalciferol (VITAMIN D-3 PO) Take 1 tablet by mouth 2 (two) times a week.            amLODipine (NORVASC) 5 MG tablet take 1 tablet by mouth once daily           levothyroxine (UNITHROID) 50 MCG tablet take 1 tablet by mouth once daily           lisinopril-hydrochlorothiazide (PRINZIDE,ZESTORETIC) 20-12.5 MG per tablet take 2 tablets by mouth once daily           oxyCODONE (ROXICODONE) 5 MG immediate release tablet Take 1 tablet (5 mg total) by mouth every 6 (six) hours as needed for pain. #30 NR            Disposition: home  Patient's condition: is Good  Follow up: 1. Dr. Myra Snyder in 2 weeks   Ronnie Massed, PA-C [facility] [phone] 01/24/2012  7:39 AM

## 2012-01-31 ENCOUNTER — Other Ambulatory Visit: Payer: Self-pay | Admitting: *Deleted

## 2012-01-31 DIAGNOSIS — G8918 Other acute postprocedural pain: Secondary | ICD-10-CM

## 2012-01-31 MED ORDER — HYDROCODONE-ACETAMINOPHEN 5-500 MG PO TABS: 1.0000 | ORAL_TABLET | Freq: Four times a day (QID) | ORAL | Status: AC | PRN

## 2012-01-31 NOTE — Progress Notes (Signed)
Patient called in to get refill on oxycodone 5 mg (# 30 Rx'd at d/c on 01-24-12; had Left iliofemoral endarterectomy on 01-23-12 by [facility]). Will step patient down to protocol rx of Hydrocodone / APAP #20, called to [facility]. Patient understands the he is not to take these 2 meds at the same time. He will take the oxycodone only at night.  His next appt is on 02-10-12. Pt is in agreement with this plan.

## 2012-02-07 ENCOUNTER — Encounter: Payer: Self-pay | Admitting: Surgery

## 2012-02-10 ENCOUNTER — Encounter: Payer: Self-pay | Admitting: Surgery

## 2012-02-10 ENCOUNTER — Ambulatory Visit (INDEPENDENT_AMBULATORY_CARE_PROVIDER_SITE_OTHER): Payer: Medicare Other | Admitting: Surgery

## 2012-02-10 VITALS — BP 133/69 | HR 77 | Temp 98.0°F | Resp 16 | Ht 69.0 in | Wt 159.5 lb

## 2012-02-10 DIAGNOSIS — Z48812 Encounter for surgical aftercare following surgery on the circulatory system: Secondary | ICD-10-CM

## 2012-02-10 DIAGNOSIS — I70219 Atherosclerosis of native arteries of extremities with intermittent claudication, unspecified extremity: Secondary | ICD-10-CM

## 2012-02-10 NOTE — Addendum Note (Signed)
Addended by: Sharee Pimple on: 02/10/2012 01:38 PM   Modules accepted: Orders

## 2012-02-10 NOTE — Progress Notes (Signed)
The patient is back today for followup. He underwent left femoral endarterectomy with bovine pericardial patch angioplasty on 01/23/2012. This was done by Dr. Edilia Bo, as I was held up by an emergency take back. The patient did not have any issues while in the hospital and was discharged home. He comes in today stating that he does not have any of his preoperative claudication symptoms. He does complain of some numbness along the medial thigh.  He is taking only one half of a pain pill 3 times a day.  His left groin incision is healing nicely. There is no evidence of any wound complications.  I am very pleased with the progress the patient has made. It sounds as if his symptoms of claudication have completely resolved. I told them that we still have the ability to perform a left leg bypass as well as the possibility of percutaneous intervention since the stump of the superficial femoral artery has been opened. However, at this time I do not believe any further treatment is required. He will come back to see me in 3 months with a duplex ultrasound

## 2012-02-12 ENCOUNTER — Ambulatory Visit: Payer: Medicare Other | Admitting: Vascular Surgery

## 2012-02-16 ENCOUNTER — Encounter: Payer: Self-pay | Admitting: Surgery

## 2012-02-18 DIAGNOSIS — M779 Enthesopathy, unspecified: Secondary | ICD-10-CM | POA: Diagnosis not present

## 2012-05-25 ENCOUNTER — Ambulatory Visit: Payer: Medicare Other | Admitting: Surgery

## 2012-06-12 ENCOUNTER — Encounter: Payer: Self-pay | Admitting: Surgery

## 2012-06-15 ENCOUNTER — Encounter (INDEPENDENT_AMBULATORY_CARE_PROVIDER_SITE_OTHER): Payer: Medicare Other | Admitting: *Deleted

## 2012-06-15 ENCOUNTER — Encounter: Payer: Self-pay | Admitting: Surgery

## 2012-06-15 ENCOUNTER — Ambulatory Visit (INDEPENDENT_AMBULATORY_CARE_PROVIDER_SITE_OTHER): Payer: Medicare Other | Admitting: Surgery

## 2012-06-15 VITALS — BP 136/73 | HR 68 | Resp 16 | Ht 69.0 in | Wt 160.4 lb

## 2012-06-15 DIAGNOSIS — M79659 Pain in unspecified thigh: Secondary | ICD-10-CM | POA: Insufficient documentation

## 2012-06-15 DIAGNOSIS — M79609 Pain in unspecified limb: Secondary | ICD-10-CM

## 2012-06-15 DIAGNOSIS — I739 Peripheral vascular disease, unspecified: Secondary | ICD-10-CM

## 2012-06-15 DIAGNOSIS — Z48812 Encounter for surgical aftercare following surgery on the circulatory system: Secondary | ICD-10-CM

## 2012-06-15 DIAGNOSIS — I70219 Atherosclerosis of native arteries of extremities with intermittent claudication, unspecified extremity: Secondary | ICD-10-CM

## 2012-06-15 NOTE — Progress Notes (Signed)
[facility]   Patient name: Ronnie Snyder MRN: [id number] DOB: 1940-05-24 Sex: male     Chief Complaint  Patient presents with  . PVD    3 month f/up with vascular Lab study.  C/O Left thigh and leg, sharp pain, duration 2-3 months.    HISTORY OF PRESENT ILLNESS: The patient comes back today for followup. He is status post left iliofemoral endarterectomy with bovine pericardial patch angioplasty on 01/23/2012. This was done by Dr. Edilia Bo as I had to deal with an emergency. This was done in the setting of left lower extremity claudication. He continues to have shooting pains down the medial side of his left thigh. He also complains of claudication almost on a regular basis. It does not appear to be significantly lifestyle limiting, but it does bother him. He has known superficial femoral and popliteal occlusive disease, however it was felt that the femoral endarterectomy would alleviate his symptoms. Unfortunately he continues to complain of these issues. He has tried Pletal in the past but could not tolerate it secondary to its side effects.  Past Medical History  Diagnosis Date  . Hx of colonic polyps   . GERD (gastroesophageal reflux disease)   . HTN (hypertension)   . Hepatic steatosis   . Abdominal aortic aneurysm     3 cm aneurysm followed with serial ultrasound  . Hypothyroidism   . Arthritis     Past Surgical History  Procedure Date  . Inguinal hernia repair   . Tonsillectomy   . Esophagogastroduodenoscopy   . Gated spect wall motion stress cardiolite 02/10/2002  . Nose surgery   . Hip fracture surgery     20 yrs ag rt  . Femoral endarterectomy 01/23/12    Left Endarterectomy  with bovie patch Angioplasy    History   Social History  . Marital Status: Married    Spouse Name: N/A    Number of Children: N/A  . Years of Education: N/A   Occupational History  . retired     Sales executive   Social History Main Topics  . Smoking status:  Current Every Day Smoker -- 1.0 packs/day for 50 years    Types: Cigarettes  . Smokeless tobacco: Never Used     Comment: pt states there is no hope for quitting  . Alcohol Use: 7.2 oz/week    12 Cans of beer per week     Comment: 6 pack every 4 days  . Drug Use: No  . Sexually Active: Not on file   Other Topics Concern  . Not on file   Social History Narrative  . No narrative on file    Family History  Problem Relation Age of Onset  . Hypertension Mother   . Hypertension Father   . Hypertension Sister   . Hypertension Sister     Allergies as of 06/15/2012 - Review Complete 06/15/2012  Allergen Reaction Noted  . Vicodin (hydrocodone-acetaminophen) Nausea Only 02/10/2012  . Atorvastatin    . Penicillins    . Pioglitazone      Current Outpatient Prescriptions on File Prior to Visit  Medication Sig Dispense Refill  . amLODipine (NORVASC) 5 MG tablet take 1 tablet by mouth once daily  14 tablet  0  . aspirin 81 MG tablet Take 81 mg by mouth every other day.       . Cholecalciferol (VITAMIN D-3 PO) Take 1 tablet by mouth 2 (two) times a week.       Marland Kitchen  cilostazol (PLETAL) 100 MG tablet Take 0.5 tablets (50 mg total) by mouth 2 (two) times daily.  1 tablet  0  . lansoprazole (PREVACID) 30 MG capsule Take 30 mg by mouth every other day.      . levothyroxine (UNITHROID) 50 MCG tablet take 1 tablet by mouth once daily  14 tablet  0  . lisinopril-hydrochlorothiazide (PRINZIDE,ZESTORETIC) 20-12.5 MG per tablet take 2 tablets by mouth once daily  28 tablet  0     REVIEW OF SYSTEMS: Please see history of present illness. Otherwise all other systems are negative.  PHYSICAL EXAMINATION:   Vital signs are BP 136/73  Pulse 68  Resp 16  Ht 5\' 9"  (1.753 m)  Wt 160 lb 6.4 oz (72.757 kg)  BMI 23.69 kg/m2  SpO2 97% General: The patient appears their stated age. HEENT:  No gross abnormalities Pulmonary:  Non labored breathing Musculoskeletal: There are no major  deformities. Neurologic: No focal weakness or paresthesias are detected, Skin: There are no ulcer or rashes noted. Psychiatric: The patient has normal affect. Cardiovascular: There is a regular rate and rhythm without significant murmur appreciated. Pedal pulses are not palpable on the left. Left groin incision is well-healed   Diagnostic Studies Ultrasound was reviewed today. This shows a decrease in the left leg ABI. It now measures 0.83. This is an increase from preoperative 0.68. The right leg is 0.99. Duplex revealed diffuse disease throughout the superficial femoral-popliteal artery on the left.  Assessment: Left leg claudication  Plan: I discussed our treatment options today with the patient. I feel these include continued medical management with the addition of Trental. He could also undergo repeat angiography and potential stent placement, versus surgical bypass. After a well informed discussion we have decided to try Trental. He did have a good response to Pletal but could not tolerate it. Hopefully he will be able to tolerate his new medication. I also talked to him about participating in [place].  He is receptive. I have scheduled him to come back in 6 months for followup. If his symptoms deteriorate between now and then the next that would be angiography. I do not feel a need to see him if he calls me before his next visit. He could just be scheduled for angiography. We also discussed having Dr. Excell Seltzer do this, since he did his original angiogram.  Jorge Ny, M.D. [facility] [place] Office: [phone] Pager:  [phone]

## 2012-06-15 NOTE — Addendum Note (Signed)
Addended by: Sharee Pimple on: 06/15/2012 12:20 PM   Modules accepted: Orders

## 2012-07-04 ENCOUNTER — Other Ambulatory Visit: Payer: Self-pay

## 2012-08-13 ENCOUNTER — Ambulatory Visit (INDEPENDENT_AMBULATORY_CARE_PROVIDER_SITE_OTHER): Payer: Medicare Other | Admitting: Endocrinology

## 2012-08-13 ENCOUNTER — Encounter: Payer: Self-pay | Admitting: Endocrinology

## 2012-08-13 VITALS — BP 124/74 | HR 79 | Wt 166.0 lb

## 2012-08-13 DIAGNOSIS — F1020 Alcohol dependence, uncomplicated: Secondary | ICD-10-CM

## 2012-08-13 DIAGNOSIS — E119 Type 2 diabetes mellitus without complications: Secondary | ICD-10-CM

## 2012-08-13 DIAGNOSIS — I10 Essential (primary) hypertension: Secondary | ICD-10-CM

## 2012-08-13 DIAGNOSIS — F1010 Alcohol abuse, uncomplicated: Secondary | ICD-10-CM

## 2012-08-13 DIAGNOSIS — F3289 Other specified depressive episodes: Secondary | ICD-10-CM

## 2012-08-13 DIAGNOSIS — F329 Major depressive disorder, single episode, unspecified: Secondary | ICD-10-CM

## 2012-08-13 DIAGNOSIS — E7800 Pure hypercholesterolemia, unspecified: Secondary | ICD-10-CM | POA: Diagnosis not present

## 2012-08-13 DIAGNOSIS — E1159 Type 2 diabetes mellitus with other circulatory complications: Secondary | ICD-10-CM

## 2012-08-13 DIAGNOSIS — F17200 Nicotine dependence, unspecified, uncomplicated: Secondary | ICD-10-CM

## 2012-08-13 DIAGNOSIS — I70219 Atherosclerosis of native arteries of extremities with intermittent claudication, unspecified extremity: Secondary | ICD-10-CM

## 2012-08-13 DIAGNOSIS — Z79899 Other long term (current) drug therapy: Secondary | ICD-10-CM

## 2012-08-13 DIAGNOSIS — Z125 Encounter for screening for malignant neoplasm of prostate: Secondary | ICD-10-CM | POA: Diagnosis not present

## 2012-08-13 LAB — URINALYSIS, ROUTINE W REFLEX MICROSCOPIC
Hgb urine dipstick: NEGATIVE
Leukocytes, UA: NEGATIVE
Nitrite: NEGATIVE
Specific Gravity, Urine: <=1.005 (ref 1.000–1.030)
Urobilinogen, UA: 0.2 (ref 0.0–1.0)

## 2012-08-13 LAB — LIPID PANEL
Cholesterol: 256 mg/dL — ABNORMAL HIGH (ref 0–200)
HDL: 36.3 mg/dL — ABNORMAL LOW (ref >39.00)
VLDL: 51.6 mg/dL — ABNORMAL HIGH (ref 0.0–40.0)

## 2012-08-13 LAB — CBC WITH DIFFERENTIAL/PLATELET
Basophils Absolute: 0 10*3/uL (ref 0.0–0.1)
Eosinophils Absolute: 0.3 10*3/uL (ref 0.0–0.7)
Lymphocytes Relative: 29.6 % (ref 12.0–46.0)
MCHC: 33.8 g/dL (ref 30.0–36.0)
Monocytes Relative: 10.5 % (ref 3.0–12.0)
Platelets: 189 10*3/uL (ref 150.0–400.0)
RDW: 13.4 % (ref 11.5–14.6)

## 2012-08-13 LAB — BASIC METABOLIC PANEL
CO2: 30 mEq/L (ref 19–32)
Chloride: 92 mEq/L — ABNORMAL LOW (ref 96–112)
Creatinine, Ser: 0.7 mg/dL (ref 0.4–1.5)
Potassium: 4.2 mEq/L (ref 3.5–5.1)

## 2012-08-13 LAB — MICROALBUMIN / CREATININE URINE RATIO
Creatinine,U: 53.2 mg/dL
Microalb Creat Ratio: 1.3 mg/g (ref 0.0–30.0)

## 2012-08-13 LAB — LDL CHOLESTEROL, DIRECT: Direct LDL: 183.8 mg/dL

## 2012-08-13 LAB — HEPATIC FUNCTION PANEL
ALT: 32 U/L (ref 0–53)
Albumin: 4.5 g/dL (ref 3.5–5.2)
Alkaline Phosphatase: 67 U/L (ref 39–117)
Bilirubin, Direct: 0.1 mg/dL (ref 0.0–0.3)
Total Protein: 7.4 g/dL (ref 6.0–8.3)

## 2012-08-13 LAB — HEMOGLOBIN A1C: Hgb A1c MFr Bld: 6.2 % (ref 4.6–6.5)

## 2012-08-13 MED ORDER — AMLODIPINE BESYLATE 5 MG PO TABS: ORAL_TABLET | ORAL | Status: DC

## 2012-08-13 MED ORDER — LISINOPRIL-HYDROCHLOROTHIAZIDE 20-25 MG PO TABS: 1.0000 | ORAL_TABLET | Freq: Every day | ORAL | Status: DC

## 2012-08-13 MED ORDER — CILOSTAZOL 100 MG PO TABS: 50.0000 mg | ORAL_TABLET | Freq: Two times a day (BID) | ORAL | Status: DC

## 2012-08-13 MED ORDER — LISINOPRIL-HYDROCHLOROTHIAZIDE 20-12.5 MG PO TABS: ORAL_TABLET | ORAL | Status: DC

## 2012-08-13 MED ORDER — LEVOTHYROXINE SODIUM 50 MCG PO TABS: ORAL_TABLET | ORAL | Status: DC

## 2012-08-13 NOTE — Patient Instructions (Addendum)
Please come back for a "medicare wellness" appointment after 09/12/12.  blood tests are being requested for you today.  We'll contact you with results.

## 2012-08-13 NOTE — Progress Notes (Signed)
Subjective:    Patient ID: Ronnie Snyder, male    DOB: August 04, 1940, 72 y.o.   MRN: [id number]  HPI The state of at least three ongoing medical problems is addressed today, with interval history of each noted here: Dyslipidemia: he denies sob. DM: he denies weight change Depression: he denies recent sxs. Past Medical History  Diagnosis Date  . Hx of colonic polyps   . GERD (gastroesophageal reflux disease)   . HTN (hypertension)   . Hepatic steatosis   . Abdominal aortic aneurysm     3 cm aneurysm followed with serial ultrasound  . Hypothyroidism   . Arthritis     Past Surgical History  Procedure Laterality Date  . Inguinal hernia repair    . Tonsillectomy    . Esophagogastroduodenoscopy    . Gated spect wall motion stress cardiolite  02/10/2002  . Nose surgery    . Hip fracture surgery      20 yrs ag rt  . Femoral endarterectomy  01/23/12    Left Endarterectomy  with bovie patch Angioplasy    History   Social History  . Marital Status: Married    Spouse Name: N/A    Number of Children: N/A  . Years of Education: N/A   Occupational History  . retired     Sales executive   Social History Main Topics  . Smoking status: Current Every Day Smoker -- 1.00 packs/day for 50 years    Types: Cigarettes  . Smokeless tobacco: Never Used     Comment: pt states there is no hope for quitting  . Alcohol Use: 7.2 oz/week    12 Cans of beer per week     Comment: 6 pack every 4 days  . Drug Use: No  . Sexually Active: Not on file   Other Topics Concern  . Not on file   Social History Narrative  . No narrative on file    Current Outpatient Prescriptions on File Prior to Visit  Medication Sig Dispense Refill  . aspirin 81 MG tablet Take 81 mg by mouth every other day.       . Cholecalciferol (VITAMIN D-3 PO) Take 1 tablet by mouth 2 (two) times a week.       . lansoprazole (PREVACID) 30 MG capsule Take 30 mg by mouth every other day.       No current facility-administered  medications on file prior to visit.    Allergies  Allergen Reactions  . Vicodin (Hydrocodone-Acetaminophen) Nausea Only  . Atorvastatin     REACTION: sick  . Penicillins     REACTION: rash  . Pioglitazone     REACTION: nausea    Family History  Problem Relation Age of Onset  . Hypertension Mother   . Hypertension Father   . Hypertension Sister   . Hypertension Sister     BP 124/74  Pulse 79  Wt 166 lb (75.297 kg)  BMI 24.5 kg/m2  SpO2 97%  Review of Systems Denies chest pain and numbness    Objective:   Physical Exam VITAL SIGNS:  See vs page GENERAL: no distress Pulses: dorsalis pedis intact bilat.   Feet: no deformity.  no ulcer on the feet.  feet are of normal color and temp.  no edema Neuro: sensation is intact to touch on the feet.  Lab Results  Component Value Date   WBC 8.6 08/13/2012   HGB 15.7 08/13/2012   HCT 46.4 08/13/2012   PLT 189.0 08/13/2012   GLUCOSE  110* 08/13/2012   CHOL 256* 08/13/2012   TRIG 258.0* 08/13/2012   HDL 36.30* 08/13/2012   LDLDIRECT 183.8 08/13/2012   ALT 32 08/13/2012   AST 29 08/13/2012   NA 132* 08/13/2012   K 4.2 08/13/2012   CL 92* 08/13/2012   CREATININE 0.7 08/13/2012   BUN 12 08/13/2012   CO2 30 08/13/2012   TSH 4.09 08/13/2012   PSA 0.51 08/13/2012   INR 0.97 01/15/2012   HGBA1C 6.2 08/13/2012   MICROALBUR 0.7 08/13/2012      Assessment & Plan:  DM: well-controlled Dyslipidemia; therapy is limited by multiple perceived drug intolerances Depression: well-controlled

## 2012-08-18 ENCOUNTER — Other Ambulatory Visit: Payer: Self-pay | Admitting: Endocrinology

## 2012-08-18 DIAGNOSIS — I70219 Atherosclerosis of native arteries of extremities with intermittent claudication, unspecified extremity: Secondary | ICD-10-CM

## 2012-08-18 MED ORDER — CILOSTAZOL 100 MG PO TABS: 50.0000 mg | ORAL_TABLET | Freq: Two times a day (BID) | ORAL | Status: DC

## 2012-08-19 ENCOUNTER — Encounter: Payer: Self-pay | Admitting: Endocrinology

## 2012-08-20 ENCOUNTER — Encounter: Payer: Self-pay | Admitting: Endocrinology

## 2012-08-20 ENCOUNTER — Telehealth: Payer: Self-pay

## 2012-08-21 ENCOUNTER — Telehealth: Payer: Self-pay | Admitting: Endocrinology

## 2012-08-21 ENCOUNTER — Ambulatory Visit (INDEPENDENT_AMBULATORY_CARE_PROVIDER_SITE_OTHER): Payer: Medicare Other | Admitting: Endocrinology

## 2012-08-21 ENCOUNTER — Encounter: Payer: Self-pay | Admitting: Endocrinology

## 2012-08-21 VITALS — BP 126/70 | HR 100 | Wt 167.0 lb

## 2012-08-21 DIAGNOSIS — G250 Essential tremor: Secondary | ICD-10-CM

## 2012-08-21 DIAGNOSIS — E871 Hypo-osmolality and hyponatremia: Secondary | ICD-10-CM | POA: Diagnosis not present

## 2012-08-21 DIAGNOSIS — I10 Essential (primary) hypertension: Secondary | ICD-10-CM | POA: Diagnosis not present

## 2012-08-21 DIAGNOSIS — G252 Other specified forms of tremor: Secondary | ICD-10-CM | POA: Diagnosis not present

## 2012-08-21 LAB — BASIC METABOLIC PANEL
CO2: 30 mEq/L (ref 19–32)
Calcium: 9.6 mg/dL (ref 8.4–10.5)
Creatinine, Ser: 0.7 mg/dL (ref 0.4–1.5)
GFR: 120.2 mL/min (ref >60.00)
Glucose, Bld: 122 mg/dL — ABNORMAL HIGH (ref 70–99)
Sodium: 135 mEq/L (ref 135–145)

## 2012-08-21 MED ORDER — LISINOPRIL-HYDROCHLOROTHIAZIDE 20-12.5 MG PO TABS: 1.0000 | ORAL_TABLET | Freq: Every day | ORAL | Status: DC

## 2012-08-21 NOTE — Progress Notes (Signed)
Subjective:    Patient ID: Ronnie Snyder, male    DOB: [birth year], 72 y.o.   MRN: [id number]  HPI Pt states few days of slight tremor of the hands, and assoc lightheadedness.   Past Medical History  Diagnosis Date  . Hx of colonic polyps   . GERD (gastroesophageal reflux disease)   . HTN (hypertension)   . Hepatic steatosis   . Abdominal aortic aneurysm     3 cm aneurysm followed with serial ultrasound  . Hypothyroidism   . Arthritis     Past Surgical History  Procedure Laterality Date  . Inguinal hernia repair    . Tonsillectomy    . Esophagogastroduodenoscopy    . Gated spect wall motion stress cardiolite  02/10/2002  . Nose surgery    . Hip fracture surgery      20 yrs ag rt  . Femoral endarterectomy  01/23/12    Left Endarterectomy  with bovie patch Angioplasy    History   Social History  . Marital Status: Married    Spouse Name: N/A    Number of Children: N/A  . Years of Education: N/A   Occupational History  . retired     Sales executive   Social History Main Topics  . Smoking status: Current Every Day Smoker -- 1.00 packs/day for 50 years    Types: Cigarettes  . Smokeless tobacco: Never Used     Comment: pt states there is no hope for quitting  . Alcohol Use: 7.2 oz/week    12 Cans of beer per week     Comment: 6 pack every 4 days  . Drug Use: No  . Sexually Active: Not on file   Other Topics Concern  . Not on file   Social History Narrative  . No narrative on file    Current Outpatient Prescriptions on File Prior to Visit  Medication Sig Dispense Refill  . amLODipine (NORVASC) 5 MG tablet take 1 tablet by mouth once daily  90 tablet  3  . aspirin 81 MG tablet Take 81 mg by mouth every other day.       . Cholecalciferol (VITAMIN D-3 PO) Take 1 tablet by mouth 2 (two) times a week.       . cilostazol (PLETAL) 100 MG tablet Take 0.5 tablets (50 mg total) by mouth 2 (two) times daily.  90 tablet  3  . lansoprazole (PREVACID) 30 MG capsule Take 30 mg  by mouth every other day.      . levothyroxine (UNITHROID) 50 MCG tablet take 1 tablet by mouth once daily  90 tablet  3   No current facility-administered medications on file prior to visit.    Allergies  Allergen Reactions  . Vicodin (Hydrocodone-Acetaminophen) Nausea Only  . Atorvastatin     REACTION: sick  . Penicillins     REACTION: rash  . Pioglitazone     REACTION: nausea    Family History  Problem Relation Age of Onset  . Hypertension Mother   . Hypertension Father   . Hypertension Sister   . Hypertension Sister     BP 126/70  Pulse 100  Wt 167 lb (75.751 kg)  BMI 24.65 kg/m2  SpO2 97%  Review of Systems He also has fatigue, but no LOC    Objective:   Physical Exam VITAL SIGNS:  See vs page GENERAL: no distress Slight fine tremor of the hands Gait is normal and steady     Assessment & Plan:  Tremor,  new, uncertain etiology HTN, ? overcontrolled Hyponatremia, prob due to zestoretic.  this could contribute to sxs.

## 2012-08-21 NOTE — Telephone Encounter (Signed)
The patient came into the office and stated he needs to discuss his medication due to medication changes.  He stated he is "jerky and unfocused".  He is hoping to discuss this with the nurse - [phone]

## 2012-08-21 NOTE — Telephone Encounter (Signed)
Pt's wife advised pt is here in the office being seen right now

## 2012-08-21 NOTE — Patient Instructions (Addendum)
Please skip 1 day of lisinopril-hctz, then resume at 20/12.5, 1 per day. blood tests are being requested for you today.  We'll contact you with results. I hope you feel better soon.  If you don't feel better by next week, please call back.  Please call sooner if you get worse.

## 2012-08-23 ENCOUNTER — Encounter: Payer: Self-pay | Admitting: Surgery

## 2012-08-24 NOTE — Telephone Encounter (Signed)
Encounter opened in error

## 2012-08-24 NOTE — Telephone Encounter (Signed)
Attempted to call pt. to advise Dr. Myra Gianotti not in the office today; no answer.  Msg. forwarded to Dr. Myra Gianotti.

## 2012-08-26 ENCOUNTER — Encounter: Payer: Self-pay | Admitting: Surgery

## 2012-08-26 ENCOUNTER — Telehealth: Payer: Self-pay

## 2012-08-26 NOTE — Telephone Encounter (Signed)
[employer] called [phone] to verify directions for pt's Lisinopril/HCTZ should pt be taking 2 tabs of 20/12.5 or 1 tablet ref # [id number]?

## 2012-08-26 NOTE — Telephone Encounter (Signed)
Will re fax paperwork.

## 2012-08-26 NOTE — Telephone Encounter (Signed)
Lisinopril/HCTZ should pt be 1 tab of 20/12.5, qd

## 2012-09-15 ENCOUNTER — Encounter: Payer: Self-pay | Admitting: Endocrinology

## 2012-09-15 ENCOUNTER — Ambulatory Visit (INDEPENDENT_AMBULATORY_CARE_PROVIDER_SITE_OTHER): Payer: Medicare Other | Admitting: Endocrinology

## 2012-09-15 VITALS — BP 124/80 | HR 80 | Wt 167.0 lb

## 2012-09-15 DIAGNOSIS — I10 Essential (primary) hypertension: Secondary | ICD-10-CM | POA: Diagnosis not present

## 2012-09-15 NOTE — Progress Notes (Signed)
Subjective:    Patient ID: Ronnie Snyder, male    DOB: 14-Jun-1940, 72 y.o.   MRN: [id number]  HPI Hyponatremia is better on the decreased zestoretic.  He still has fatigue. Past Medical History  Diagnosis Date  . Hx of colonic polyps   . GERD (gastroesophageal reflux disease)   . HTN (hypertension)   . Hepatic steatosis   . Abdominal aortic aneurysm     3 cm aneurysm followed with serial ultrasound  . Hypothyroidism   . Arthritis     Past Surgical History  Procedure Laterality Date  . Inguinal hernia repair    . Tonsillectomy    . Esophagogastroduodenoscopy    . Gated spect wall motion stress cardiolite  02/10/2002  . Nose surgery    . Hip fracture surgery      20 yrs ag rt  . Femoral endarterectomy  01/23/12    Left Endarterectomy  with bovie patch Angioplasy    History   Social History  . Marital Status: Married    Spouse Name: N/A    Number of Children: N/A  . Years of Education: N/A   Occupational History  . retired     Sales executive   Social History Main Topics  . Smoking status: Current Every Day Smoker -- 1.00 packs/day for 50 years    Types: Cigarettes  . Smokeless tobacco: Never Used     Comment: pt states there is no hope for quitting  . Alcohol Use: 7.2 oz/week    12 Cans of beer per week     Comment: 6 pack every 4 days  . Drug Use: No  . Sexually Active: Not on file   Other Topics Concern  . Not on file   Social History Narrative  . No narrative on file    Current Outpatient Prescriptions on File Prior to Visit  Medication Sig Dispense Refill  . amLODipine (NORVASC) 5 MG tablet take 1 tablet by mouth once daily  90 tablet  3  . aspirin 81 MG tablet Take 81 mg by mouth every other day.       . Cholecalciferol (VITAMIN D-3 PO) Take 1 tablet by mouth 2 (two) times a week.       . lansoprazole (PREVACID) 30 MG capsule Take 30 mg by mouth every other day.      . levothyroxine (UNITHROID) 50 MCG tablet take 1 tablet by mouth once daily  90  tablet  3  . lisinopril-hydrochlorothiazide (PRINZIDE,ZESTORETIC) 20-12.5 MG per tablet Take 1 tablet by mouth daily.  90 tablet  3   No current facility-administered medications on file prior to visit.    Allergies  Allergen Reactions  . Vicodin (Hydrocodone-Acetaminophen) Nausea Only  . Atorvastatin     REACTION: sick  . Penicillins     REACTION: rash  . Pioglitazone     REACTION: nausea    Family History  Problem Relation Age of Onset  . Hypertension Mother   . Hypertension Father   . Hypertension Sister   . Hypertension Sister     BP 124/80  Pulse 80  Wt 167 lb (75.751 kg)  BMI 24.65 kg/m2  SpO2 94%     Review of Systems Denies sob    Objective:   Physical Exam VITAL SIGNS:  See vs page GENERAL: no distress        Assessment & Plan:  Hyponatremia, resolved HTN, well-controlled    Subjective:   Patient here for Medicare annual wellness visit and management  of other chronic and acute problems.     Risk factors: advanced age    Roster of Physicians Providing Medical Care to Patient:  See "snapshot"   Activities of Daily Living: In your present state of health, do you have any difficulty performing the following activities?:  Preparing food and eating?: No  Bathing yourself: No  Getting dressed: No  Using the toilet: No  Moving around from place to place: No  In the past year have you fallen or had a near fall?: No    Home Safety: Has smoke detector and wears seat belts.  Firearms are safely stored. No excess sun exposure.  Diet and Exercise  Current exercise habits: pt says limited by PAD Dietary issues discussed: pt reports a healthy diet   Depression Screen  Q1: Over the past two weeks, have you felt down, depressed or hopeless? no  Q2: Over the past two weeks, have you felt little interest or pleasure in doing things? no   The following portions of the patient's history were reviewed and updated as appropriate: allergies, current  medications, past family history, past medical history, past social history, past surgical history and problem list.  Past Medical History  Diagnosis Date  . Hx of colonic polyps   . GERD (gastroesophageal reflux disease)   . HTN (hypertension)   . Hepatic steatosis   . Abdominal aortic aneurysm     3 cm aneurysm followed with serial ultrasound  . Hypothyroidism   . Arthritis     Past Surgical History  Procedure Laterality Date  . Inguinal hernia repair    . Tonsillectomy    . Esophagogastroduodenoscopy    . Gated spect wall motion stress cardiolite  02/10/2002  . Nose surgery    . Hip fracture surgery      20 yrs ag rt  . Femoral endarterectomy  01/23/12    Left Endarterectomy  with bovie patch Angioplasy    History   Social History  . Marital Status: Married    Spouse Name: N/A    Number of Children: N/A  . Years of Education: N/A   Occupational History  . retired     Sales executive   Social History Main Topics  . Smoking status: Current Every Day Smoker -- 1.00 packs/day for 50 years    Types: Cigarettes  . Smokeless tobacco: Never Used     Comment: pt states there is no hope for quitting  . Alcohol Use: 7.2 oz/week    12 Cans of beer per week     Comment: 6 pack every 4 days  . Drug Use: No  . Sexually Active: Not on file   Other Topics Concern  . Not on file   Social History Narrative  . No narrative on file    Current Outpatient Prescriptions on File Prior to Visit  Medication Sig Dispense Refill  . amLODipine (NORVASC) 5 MG tablet take 1 tablet by mouth once daily  90 tablet  3  . aspirin 81 MG tablet Take 81 mg by mouth every other day.       . Cholecalciferol (VITAMIN D-3 PO) Take 1 tablet by mouth 2 (two) times a week.       . lansoprazole (PREVACID) 30 MG capsule Take 30 mg by mouth every other day.      . levothyroxine (UNITHROID) 50 MCG tablet take 1 tablet by mouth once daily  90 tablet  3  . lisinopril-hydrochlorothiazide (PRINZIDE,ZESTORETIC)  20-12.5 MG per tablet Take  1 tablet by mouth daily.  90 tablet  3   No current facility-administered medications on file prior to visit.    Allergies  Allergen Reactions  . Vicodin (Hydrocodone-Acetaminophen) Nausea Only  . Atorvastatin     REACTION: sick  . Penicillins     REACTION: rash  . Pioglitazone     REACTION: nausea    Family History  Problem Relation Age of Onset  . Hypertension Mother   . Hypertension Father   . Hypertension Sister   . Hypertension Sister     BP 124/80  Pulse 80  Wt 167 lb (75.751 kg)  BMI 24.65 kg/m2  SpO2 94%   Review of Systems  Denies hearing loss, and visual loss Objective:   Vision:  Sees opthalmologist Hearing: grossly normal Body mass index:  See vs page Msk: pt easily and quickly performs "get-up-and-go" from a sitting position Cognitive Impairment Assessment: cognition, memory and judgment appear normal.  remembers 3/3 at 5 minutes.  excellent recall.  can easily read and write a sentence.  alert and oriented x 3.   Assessment:   Medicare wellness utd on preventive parameters    Plan:   During the course of the visit the patient was educated and counseled about appropriate screening and preventive services including:        Fall prevention  Diabetes screening  Nutrition counseling   Vaccines / LABS Zostavax / Pnemonccoal Vaccine  today  PSA  Patient Instructions (the written plan) was given to the patient.   we discussed code status.  pt requests full code, but would not want to be started or maintained on artificial life-support measures if there was not a reasonable chance of recovery.

## 2012-09-15 NOTE — Patient Instructions (Addendum)
good diet and exercise habits significanly improve the control of your diabetes.  please let me know if you wish to be referred to a dietician.  high blood sugar is very risky to your health.  you should see an eye doctor every year.  You are at higher than average risk for pneumonia and hepatitis-B.  You should be vaccinated against both.   please consider these measures for your health:  minimize alcohol.  do not use tobacco products.  have a colonoscopy at least every 10 years from age 46.  keep firearms safely stored.  always use seat belts.  have working smoke alarms in your home.  see an eye doctor and dentist regularly.  never drive under the influence of alcohol or drugs (including prescription drugs).  those with fair skin should take precautions against the sun. it is critically important to prevent falling down (keep floor areas well-lit, dry, and free of loose objects.  If you have a cane, walker, or wheelchair, you should use it, even for short trips around the house.  Also, try not to rush) You should have a vaccine against shingles (a painful rash which results from the  chickenpox infection which most people had many years ago).  This vaccine reduces, but does not totally eliminate the risk of shingles.  Because this is a medicare part d benefit, you should get it at a pharmacy.   Please return in 1 year.

## 2012-10-07 ENCOUNTER — Encounter: Payer: Self-pay | Admitting: Surgery

## 2012-10-08 ENCOUNTER — Other Ambulatory Visit: Payer: Self-pay

## 2012-10-08 NOTE — Telephone Encounter (Signed)
Contacted pt. per phone to schedule angiogram.  States he is ready to do this procedure, because he continues to have pain in left leg; describes as "a burning, shooting pain from left groin down the leg".  Also complains of numbness in left leg.  Advised will schedule angiogram for 10/13/12.  Will make Dr. Myra Gianotti aware.  Pre-procedure instructions given.  Verb. understanding.

## 2012-10-12 MED ORDER — SODIUM CHLORIDE 0.9 % IV SOLN: INTRAVENOUS | Status: DC

## 2012-10-13 ENCOUNTER — Ambulatory Visit (HOSPITAL_COMMUNITY)
Admission: RE | Admit: 2012-10-13 | Discharge: 2012-10-14 | Disposition: A | Discharge status: Home / Self Care | Payer: Medicare Other | Source: Ambulatory Visit | Attending: Surgery | Admitting: Surgery

## 2012-10-13 ENCOUNTER — Encounter (HOSPITAL_COMMUNITY)
Admission: RE | Disposition: A | Discharge status: Home / Self Care | Payer: Self-pay | Source: Ambulatory Visit | Attending: Surgery

## 2012-10-13 ENCOUNTER — Encounter (HOSPITAL_COMMUNITY): Payer: Self-pay | Admitting: General Practice

## 2012-10-13 DIAGNOSIS — I714 Abdominal aortic aneurysm, without rupture, unspecified: Secondary | ICD-10-CM | POA: Insufficient documentation

## 2012-10-13 DIAGNOSIS — Z79899 Other long term (current) drug therapy: Secondary | ICD-10-CM | POA: Diagnosis not present

## 2012-10-13 DIAGNOSIS — Z23 Encounter for immunization: Secondary | ICD-10-CM | POA: Insufficient documentation

## 2012-10-13 DIAGNOSIS — I70219 Atherosclerosis of native arteries of extremities with intermittent claudication, unspecified extremity: Secondary | ICD-10-CM | POA: Diagnosis not present

## 2012-10-13 DIAGNOSIS — E119 Type 2 diabetes mellitus without complications: Secondary | ICD-10-CM | POA: Insufficient documentation

## 2012-10-13 DIAGNOSIS — I10 Essential (primary) hypertension: Secondary | ICD-10-CM | POA: Insufficient documentation

## 2012-10-13 HISTORY — DX: Personal history of other diseases of the digestive system: Z87.19

## 2012-10-13 HISTORY — PX: ABDOMINAL AORTAGRAM: SHX5454

## 2012-10-13 HISTORY — DX: Peripheral vascular disease, unspecified: I73.9

## 2012-10-13 HISTORY — DX: Pure hypercholesterolemia, unspecified: E78.00

## 2012-10-13 HISTORY — DX: Unspecified malignant neoplasm of skin, unspecified: C44.90

## 2012-10-13 HISTORY — PX: ANGIOPLASTY / STENTING FEMORAL: SUR30

## 2012-10-13 LAB — POCT ACTIVATED CLOTTING TIME
Activated Clotting Time: 230 seconds
Activated Clotting Time: 181 seconds
Activated Clotting Time: 187 seconds

## 2012-10-13 LAB — POCT I-STAT, CHEM 8
Chloride: 99 mEq/L (ref 96–112)
HCT: 47 % (ref 39.0–52.0)
Hemoglobin: 16 g/dL (ref 13.0–17.0)
Potassium: 3.7 mEq/L (ref 3.5–5.1)
Sodium: 136 mEq/L (ref 135–145)

## 2012-10-13 SURGERY — ABDOMINAL AORTAGRAM
Anesthesia: LOCAL

## 2012-10-13 MED ORDER — ACETAMINOPHEN 325 MG PO TABS
ORAL_TABLET | ORAL | Status: AC
  Filled 2012-10-13: qty 2

## 2012-10-13 MED ORDER — HYDRALAZINE HCL 20 MG/ML IJ SOLN: 10.0000 mg | INTRAMUSCULAR | Status: DC | PRN

## 2012-10-13 MED ORDER — ACETAMINOPHEN 325 MG RE SUPP
325.0000 mg | RECTAL | Status: DC | PRN
  Filled 2012-10-13: qty 2

## 2012-10-13 MED ORDER — PNEUMOCOCCAL VAC POLYVALENT 25 MCG/0.5ML IJ INJ
0.5000 mL | INJECTION | INTRAMUSCULAR | Status: AC
  Administered 2012-10-14: 09:00:00 0.5 mL via INTRAMUSCULAR
  Filled 2012-10-13: qty 0.5

## 2012-10-13 MED ORDER — ONDANSETRON HCL 4 MG/2ML IJ SOLN: 4.0000 mg | Freq: Four times a day (QID) | INTRAMUSCULAR | Status: DC | PRN

## 2012-10-13 MED ORDER — HEPARIN SODIUM (PORCINE) 1000 UNIT/ML IJ SOLN
INTRAMUSCULAR | Status: AC
  Filled 2012-10-13: qty 1

## 2012-10-13 MED ORDER — ALUM & MAG HYDROXIDE-SIMETH 200-200-20 MG/5ML PO SUSP: 15.0000 mL | ORAL | Status: DC | PRN

## 2012-10-13 MED ORDER — VERAPAMIL HCL 2.5 MG/ML IV SOLN
INTRAVENOUS | Status: AC
  Filled 2012-10-13: qty 2

## 2012-10-13 MED ORDER — MORPHINE SULFATE 2 MG/ML IJ SOLN: 2.0000 mg | INTRAMUSCULAR | Status: DC | PRN

## 2012-10-13 MED ORDER — GUAIFENESIN-DM 100-10 MG/5ML PO SYRP: 15.0000 mL | ORAL_SOLUTION | ORAL | Status: DC | PRN

## 2012-10-13 MED ORDER — MIDAZOLAM HCL 2 MG/2ML IJ SOLN
INTRAMUSCULAR | Status: AC
  Filled 2012-10-13: qty 2

## 2012-10-13 MED ORDER — FENTANYL CITRATE 0.05 MG/ML IJ SOLN
INTRAMUSCULAR | Status: AC
  Filled 2012-10-13: qty 2

## 2012-10-13 MED ORDER — LABETALOL HCL 5 MG/ML IV SOLN
10.0000 mg | INTRAVENOUS | Status: DC | PRN
  Filled 2012-10-13: qty 4

## 2012-10-13 MED ORDER — LIDOCAINE HCL (PF) 1 % IJ SOLN
INTRAMUSCULAR | Status: AC
  Filled 2012-10-13: qty 30

## 2012-10-13 MED ORDER — CLOPIDOGREL BISULFATE 75 MG PO TABS
75.0000 mg | ORAL_TABLET | Freq: Every day | ORAL | Status: DC
  Administered 2012-10-14: 75 mg via ORAL
  Filled 2012-10-13: qty 1

## 2012-10-13 MED ORDER — PHENOL 1.4 % MT LIQD
1.0000 | OROMUCOSAL | Status: DC | PRN
  Filled 2012-10-13: qty 177

## 2012-10-13 MED ORDER — SODIUM CHLORIDE 0.9 % IV SOLN: INTRAVENOUS | Status: DC

## 2012-10-13 MED ORDER — OXYCODONE HCL 5 MG PO TABS
5.0000 mg | ORAL_TABLET | ORAL | Status: DC | PRN
  Filled 2012-10-13: qty 1

## 2012-10-13 MED ORDER — METOPROLOL TARTRATE 1 MG/ML IV SOLN: 2.0000 mg | INTRAVENOUS | Status: DC | PRN

## 2012-10-13 MED ORDER — ACETAMINOPHEN 325 MG PO TABS
325.0000 mg | ORAL_TABLET | ORAL | Status: DC | PRN
  Administered 2012-10-13: 650 mg via ORAL
  Administered 2012-10-13: 21:00:00 325 mg via ORAL
  Filled 2012-10-13: qty 2

## 2012-10-13 NOTE — Op Note (Signed)
[facility]  Patient name: Ronnie Snyder MRN: [id number] DOB: 1940/06/10 Sex: male  10/13/2012 Pre-operative Diagnosis: Left leg claudication Post-operative diagnosis:  Same Surgeon:  Jorge Ny Procedure Performed:  1.  ultrasound-guided access, right femoral artery  2.  abdominal aortogram  3.  left lower extremity runoff  4.  atherectomy, left superficial femoral and popliteal artery  5.  stent, left superficial femoral and popliteal   Indications:  The patient is previously undergone endarterectomy with patch angioplasty of a high-grade left common femoral artery stenosis. He continues to have symptoms that her lifestyle limiting. He comes in for further evaluation and possible intervention.  Procedure:  The patient was identified in the holding area and taken to room 8.  The patient was then placed supine on the table and prepped and draped in the usual sterile fashion.  A time out was called.  Ultrasound was used to evaluate the right common femoral artery.  It was patent .  A digital ultrasound image was acquired.  A micropuncture needle was used to access the right common femoral artery under ultrasound guidance.  An 018 wire was advanced without resistance and a micropuncture sheath was placed.  The 018 wire was removed and a benson wire was placed.  The micropuncture sheath was exchanged for a 5 french sheath.  An omniflush catheter was advanced over the wire to the level of L-1.  An abdominal angiogram was obtained.  Next, using the omniflush catheter and a benson wire, the aortic bifurcation was crossed and the catheter was placed into theleft external iliac artery and left runoff was obtained.  Findings:   Aortogram:  Slush portions of the suprarenal abdominal aorta showed no significant stenosis. Single renal arteries are patent bilaterally. Aneurysmal degeneration of the infrarenal abdominal aorta which cannot be classified but this  image is visualized. Possible left common iliac aneurysmal degeneration. No significant stenosis is identified within bilateral iliac arterial systems  Left Lower Extremity:  Patulous dilatation of the left common femoral artery consistent with previous patch angioplasty. This area is widely patent into the proximal origins of the superficial femoral and profunda femoral artery. The superficial femoral artery is diffusely diseased and heavily calcified throughout it's course. Within the adductor canal there is an area of total occlusion. In addition there is a large calcific plaque in the popliteal artery behind the patella that as a stenosis approximately 80%. The below knee popliteal artery is widely patent with three-vessel runoff.  Intervention:  After the above images were acquired, the decision was made to proceed with intervention. A 7 French Ansel 1 sheath was advanced over the bifurcation of the left external iliac artery. The patient was fully heparinized. Then, using a 035 glide wire and a quick cross catheter I was able to cross the stenosis/occlusion in the superficial femoral artery and gain access into the popliteal artery below the knee. A 014 Viper was then placed. A CSI 1.5 solid crown device was used for atherectomy. This was performed at low medium and high speed throughout the entire length of the superficial femoral and popliteal artery as well as the calcific plaque behind the patella. I then perform balloon angioplasty of the entire atherectomized segment with a 5 mm balloon. Because of the plaque burden I did not feel that this would be a durable result, and therefore I elected to stent the entire treated area. I placed 3 overlapping 7 mm Cordis self-expanding stents. They were molded to  confirmation with a 6 mm balloon. Followup angiogram revealed excellent result with preserved three-vessel runoff. The patient was taken to the holding area for sheath pull once his coagulation profile  corrects. The sheath was withdrawn to the right external iliac artery. There were no complications.  Impression:  #1  aneurysmal degeneration of the abdominal aorta and possible iliac arterial system on the left. This is not well classified by angiography.  #2  successful atherectomy with stenting of the superficial femoral and above-knee popliteal artery using a CSI 1.5 solid crown device and 7 mm self-expanding Cordis stents  #3  three-vessel runoff to the left leg    V. Durene Cal, M.D. [facility] [place] Office: [phone] Pager:  [phone]

## 2012-10-13 NOTE — H&P (Signed)
[facility]     Patient name: Ronnie Snyder     MRN: [id number]        DOB: 07-Apr-1941          Sex: male      Chief Complaint   Patient presents with   .  PVD       3 month f/up with vascular Lab study.  C/O Left thigh and leg, sharp pain, duration 2-3 months.        HISTORY OF PRESENT ILLNESS: The patient comes back today for followup. He is status post left iliofemoral endarterectomy with bovine pericardial patch angioplasty on 01/23/2012. This was done by Dr. Edilia Bo as I had to deal with an emergency. This was done in the setting of left lower extremity claudication. He continues to have shooting pains down the medial side of his left thigh. He also complains of claudication almost on a regular basis. It does not appear to be significantly lifestyle limiting, but it does bother him. He has known superficial femoral and popliteal occlusive disease, however it was felt that the femoral endarterectomy would alleviate his symptoms. Unfortunately he continues to complain of these issues. He has tried Pletal in the past but could not tolerate it secondary to its side effects.    Past Medical History   Diagnosis  Date   .  Hx of colonic polyps     .  GERD (gastroesophageal reflux disease)     .  HTN (hypertension)     .  Hepatic steatosis     .  Abdominal aortic aneurysm         3 cm aneurysm followed with serial ultrasound   .  Hypothyroidism     .  Arthritis           Past Surgical History   Procedure  Date   .  Inguinal hernia repair     .  Tonsillectomy     .  Esophagogastroduodenoscopy     .  Gated spect wall motion stress cardiolite  02/10/2002   .  Nose surgery     .  Hip fracture surgery         20 yrs ag rt   .  Femoral endarterectomy  01/23/12       Left Endarterectomy  with bovie patch Angioplasy         History       Social History   .  Marital Status:  Married       Spouse Name:  N/A       Number of  Children:  N/A   .  Years of Education:  N/A       Occupational History   .  retired         Sales executive       Social History Main Topics   .  Smoking status:  Current Every Day Smoker -- 1.0 packs/day for 50 years       Types:  Cigarettes   .  Smokeless tobacco:  Never Used         Comment: pt states there is no hope for quitting   .  Alcohol Use:  7.2 oz/week       12 Cans of beer per week         Comment: 6 pack every 4 days   .  Drug Use:  No   .  Sexually Active:  Not on file       Other Topics  Concern   .  Not on file       Social History Narrative   .  No narrative on file         Family History   Problem  Relation  Age of Onset   .  Hypertension  Mother     .  Hypertension  Father     .  Hypertension  Sister     .  Hypertension  Sister           Allergies as of 06/15/2012 - Review Complete 06/15/2012   Allergen  Reaction  Noted   .  Vicodin (hydrocodone-acetaminophen)  Nausea Only  02/10/2012   .  Atorvastatin       .  Penicillins       .  Pioglitazone             Current Outpatient Prescriptions on File Prior to Visit   Medication  Sig  Dispense  Refill   .  amLODipine (NORVASC) 5 MG tablet  take 1 tablet by mouth once daily   14 tablet   0   .  aspirin 81 MG tablet  Take 81 mg by mouth every other day.          .  Cholecalciferol (VITAMIN D-3 PO)  Take 1 tablet by mouth 2 (two) times a week.          .  cilostazol (PLETAL) 100 MG tablet  Take 0.5 tablets (50 mg total) by mouth 2 (two) times daily.   1 tablet   0   .  lansoprazole (PREVACID) 30 MG capsule  Take 30 mg by mouth every other day.         .  levothyroxine (UNITHROID) 50 MCG tablet  take 1 tablet by mouth once daily   14 tablet   0   .  lisinopril-hydrochlorothiazide (PRINZIDE,ZESTORETIC) 20-12.5 MG per tablet  take 2 tablets by mouth once daily   28 tablet   0          REVIEW OF SYSTEMS: Please see history of present illness. Otherwise all other systems are negative.   PHYSICAL  EXAMINATION:   Vital signs are BP 136/73  Pulse 68  Resp 16  Ht 5\' 9"  (1.753 m)  Wt 160 lb 6.4 oz (72.757 kg)  BMI 23.69 kg/m2  SpO2 97% General: The patient appears their stated age. HEENT:  No gross abnormalities Pulmonary:  Non labored breathing Musculoskeletal: There are no major deformities. Neurologic: No focal weakness or paresthesias are detected, Skin: There are no ulcer or rashes noted. Psychiatric: The patient has normal affect. Cardiovascular: There is a regular rate and rhythm without significant murmur appreciated. Pedal pulses are not palpable on the left. Left groin incision is well-healed     Diagnostic Studies Ultrasound was reviewed today. This shows a decrease in the left leg ABI. It now measures 0.83. This is an increase from preoperative 0.68. The right leg is 0.99. Duplex revealed diffuse disease throughout the superficial femoral-popliteal artery on the left.   Assessment: Left leg claudication  Plan: I discussed our treatment options today with the patient. I feel these include continued medical management with the addition of Trental. He could also undergo repeat angiography and potential stent placement, versus surgical bypass. After a well informed discussion we have decided to try Trental. He did have a good response to Pletal but could  not tolerate it. Hopefully he will be able to tolerate his new medication. I also talked to him about participating in [place].  He is receptive. I have scheduled him to come back in 6 months for followup. If his symptoms deteriorate between now and then the next that would be angiography. I do not feel a need to see him if he calls me before his next visit. He could just be scheduled for angiography. We also discussed having Dr. Excell Seltzer do this, since he did his original angiogram.   Jorge Ny, M.D. [facility] [place] Office: [phone] Pager:  [phone]

## 2012-10-14 ENCOUNTER — Telehealth: Payer: Self-pay | Admitting: Surgery

## 2012-10-14 ENCOUNTER — Other Ambulatory Visit: Payer: Self-pay | Admitting: *Deleted

## 2012-10-14 DIAGNOSIS — Z48812 Encounter for surgical aftercare following surgery on the circulatory system: Secondary | ICD-10-CM

## 2012-10-14 DIAGNOSIS — I739 Peripheral vascular disease, unspecified: Secondary | ICD-10-CM

## 2012-10-14 LAB — POCT ACTIVATED CLOTTING TIME
Activated Clotting Time: 247 seconds
Activated Clotting Time: 219 seconds

## 2012-10-14 MED ORDER — CLOPIDOGREL BISULFATE 75 MG PO TABS: 75.0000 mg | ORAL_TABLET | Freq: Every day | ORAL | Status: DC

## 2012-10-14 NOTE — Progress Notes (Signed)
Site area: right groin  Site Prior to Removal:  Level 0  Pressure Applied For 20 MINUTES    Minutes Beginning at 1945  Manual:   yes  Patient Status During Pull stable  Post Pull Groin Site:  Level 0  Post Pull Instructions Given:  yes  Post Pull Pulses Present:  yes  Dressing Applied:  yes  Comments:

## 2012-10-14 NOTE — Telephone Encounter (Addendum)
Message copied by Rosalyn Charters on Wed Oct 14, 2012 11:19 AM ------      Message from: Melene Plan      Created: Tue Oct 13, 2012  5:03 PM                   ----- Message -----         From: Nada Libman, MD         Sent: 10/13/2012   4:38 PM           To: Reuel Derby, Melene Plan, RN            10/13/2012                              Surgeon:  Jorge Ny      Procedure Performed:       1.  ultrasound-guided access, right femoral artery       2.  abdominal aortogram       3.  left lower extremity runoff       4.  atherectomy, left superficial femoral and popliteal artery       5.  stent, left superficial femoral and popliteal                        Please schedule the patient for followup in the office in 3 months with a duplex ultrasound and ABIs ------  notified patient of fu appt. on 01-11-13 3PM with dr. Myra Gianotti

## 2012-10-14 NOTE — Progress Notes (Signed)
[facility] Progress Note  10/14/2012 7:51 AM 1 Day Post-Op  Subjective:  "ready to go home"  afebrile VSS 92% RA  Filed Vitals:   10/14/12 0746  BP: 136/75  Pulse: 66  Temp: 98.1 F (36.7 C)  Resp: 16    Physical Exam: Incisions:  Right groin is soft without hematoma Extremities:  Bilateral feet are warm; palpable DP bilaterally  CBC    Component Value Date/Time   WBC 8.6 08/13/2012 0921   RBC 5.35 08/13/2012 0921   HGB 16.0 10/13/2012 1041   HCT 47.0 10/13/2012 1041   PLT 189.0 08/13/2012 0921   MCV 86.8 08/13/2012 0921   MCH 29.4 01/24/2012 0310   MCHC 33.8 08/13/2012 0921   RDW 13.4 08/13/2012 0921   LYMPHSABS 2.5 08/13/2012 0921   MONOABS 0.9 08/13/2012 0921   EOSABS 0.3 08/13/2012 0921   BASOSABS 0.0 08/13/2012 0921    BMET    Component Value Date/Time   NA 136 10/13/2012 1041   K 3.7 10/13/2012 1041   CL 99 10/13/2012 1041   CO2 30 08/21/2012 0958   GLUCOSE 114* 10/13/2012 1041   BUN 10 10/13/2012 1041   CREATININE 0.60 10/13/2012 1041   CALCIUM 9.6 08/21/2012 0958   GFRNONAA >90 01/24/2012 0310   GFRAA >90 01/24/2012 0310    INR    Component Value Date/Time   INR 0.97 01/15/2012 0959     Intake/Output Summary (Last 24 hours) at 10/14/12 0751 Last data filed at 10/14/12 0500  Gross per 24 hour  Intake  382.5 ml  Output    925 ml  Net -542.5 ml     Assessment/Plan:  72 y.o. male is s/p:  1. ultrasound-guided access, right femoral artery  2. abdominal aortogram  3. left lower extremity runoff  4. atherectomy, left superficial femoral and popliteal artery  5. stent, left superficial femoral and popliteal   1 Day Post-Op  -pt doing well this am. -BUN/Cr are normal this am -discharge pt this am. -f/u with Dr. Myra Gianotti in 1 month   Doreatha Massed, PA-C [facility] [phone] 10/14/2012 7:51 AM

## 2012-10-14 NOTE — Discharge Summary (Signed)
[facility] Discharge Summary  Ronnie Snyder Jan 26, 1941 72 y.o. male  [id number]  Admission Date: 10/13/2012  Discharge Date: 10/14/12  Physician: Nada Libman, MD  Admission Diagnosis: PVD   HPI:   This is a 72 y.o. male who is status post left iliofemoral endarterectomy with bovine pericardial patch angioplasty on 01/23/2012. This was done by Dr. Edilia Bo as I had to deal with an emergency. This was done in the setting of left lower extremity claudication. He continues to have shooting pains down the medial side of his left thigh. He also complains of claudication almost on a regular basis. It does not appear to be significantly lifestyle limiting, but it does bother him. He has known superficial femoral and popliteal occlusive disease, however it was felt that the femoral endarterectomy would alleviate his symptoms. Unfortunately he continues to complain of these issues. He has tried Pletal in the past but could not tolerate it secondary to its side effects.  Hospital Course:  The patient was admitted to the hospital and taken to the operating room on 10/13/2012 and underwent: 1. ultrasound-guided access, right femoral artery  2. abdominal aortogram  3. left lower extremity runoff  4. atherectomy, left superficial femoral and popliteal artery  5. stent, left superficial femoral and popliteal    The pt tolerated the procedure well and was transported to the PACU in good condition. By the morning of post op day one, the pt is doing well with palpable pedal pulses bilaterally.  His sheath was pulled later in the day and his bed rest was up at midnight and therefore, pt was admitted over night.  The remainder of the hospital course consisted of increasing mobilization and increasing intake of solids without difficulty.  CBC    Component Value Date/Time   WBC 8.6 08/13/2012 0921   RBC 5.35 08/13/2012 0921   HGB 16.0 10/13/2012 1041   HCT 47.0 10/13/2012 1041    PLT 189.0 08/13/2012 0921   MCV 86.8 08/13/2012 0921   MCH 29.4 01/24/2012 0310   MCHC 33.8 08/13/2012 0921   RDW 13.4 08/13/2012 0921   LYMPHSABS 2.5 08/13/2012 0921   MONOABS 0.9 08/13/2012 0921   EOSABS 0.3 08/13/2012 0921   BASOSABS 0.0 08/13/2012 0921    BMET    Component Value Date/Time   NA 136 10/13/2012 1041   K 3.7 10/13/2012 1041   CL 99 10/13/2012 1041   CO2 30 08/21/2012 0958   GLUCOSE 114* 10/13/2012 1041   BUN 10 10/13/2012 1041   CREATININE 0.60 10/13/2012 1041   CALCIUM 9.6 08/21/2012 0958   GFRNONAA >90 01/24/2012 0310   GFRAA >90 01/24/2012 0310     Discharge Instructions:   The patient is discharged to home with extensive instructions on wound care and progressive ambulation.  They are instructed not to drive or perform any heavy lifting until returning to see the physician in his office.  Discharge Orders   Future Appointments Provider Department Dept Phone   12/14/2012 9:00 AM [facility] [phone]   12/14/2012 9:30 AM Nada Libman, MD [facility] [phone]   Future Orders Complete By Expires     Call MD for:  redness, tenderness, or signs of infection (pain, swelling, bleeding, redness, odor or green/yellow discharge around incision site)  As directed     Call MD for:  severe or increased pain, loss or decreased feeling  in affected limb(s)  As directed  Call MD for:  temperature >100.5  As directed     Discharge wound care:  As directed     Comments:      Shower daily with soap and water starting 10/14/12    Driving Restrictions  As directed     Comments:      No driving for 24 hours and while taking pain medication.    Lifting restrictions  As directed     Comments:      No lifting for 4 weeks    Resume previous diet  As directed        Discharge Diagnosis:  PVD  Secondary Diagnosis: Patient Active Problem List   Diagnosis Date Noted  . Hyponatremia 08/21/2012  . Smoker  08/13/2012  . Other and unspecified alcohol dependence, unspecified drinking behavior 08/13/2012  . Diabetes 08/13/2012  . Screening for prostate cancer 08/13/2012  . Encounter for long-term (current) use of other medications 08/13/2012  . Pain of thigh 06/15/2012  . Type II or unspecified type diabetes mellitus with peripheral circulatory disorders, not stated as uncontrolled 09/13/2011  . Atherosclerosis of native arteries of the extremities with intermittent claudication 08/21/2011  . ECZEMA 01/24/2010  . SEBACEOUS CYST 01/24/2009  . COUGH 01/24/2009  . AAA 12/06/2008  . HYPERCHOLESTEROLEMIA 01/21/2008  . ERECTILE DYSFUNCTION, ORGANIC 01/21/2008  . LOW BACK PAIN, CHRONIC 01/21/2008  . 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 ANXIETY 03/07/2007  . ERECTILE DYSFUNCTION 03/07/2007  . SMOKER 03/07/2007  . DEPRESSION 03/07/2007  . HYPERTENSION 03/07/2007  . CAROTID ARTERY STENOSIS 03/07/2007  . GERD 03/07/2007  . HIP PAIN, RIGHT 03/07/2007  . COLONIC POLYPS, HX OF 03/07/2007  . ADENOIDECTOMY, HX OF 03/07/2007   Past Medical History  Diagnosis Date  . Hx of colonic polyps   . GERD (gastroesophageal reflux disease)   . HTN (hypertension)   . Hepatic steatosis   . Abdominal aortic aneurysm     3 cm aneurysm followed with serial ultrasound  . Hypothyroidism   . PAD (peripheral artery disease)   . High cholesterol     "at one time; it's fine now" (10/13/2012)  . H/O hiatal hernia   . Arthritis     "in my feet" (10/13/2012)  . Skin cancer     "burned them off my arm and such" (10/13/2012)       Medication List    TAKE these medications       acetaminophen 500 MG tablet  Commonly known as:  TYLENOL  Take 250 mg by mouth every 6 (six) hours as needed for pain.     amLODipine 5 MG tablet  Commonly known as:  NORVASC  Take 5 mg by mouth daily.     aspirin 81 MG tablet  Take 81 mg by mouth every other day.     clopidogrel 75 MG tablet  Commonly known as:  PLAVIX  Take 1 tablet  (75 mg total) by mouth daily with breakfast.     lansoprazole 30 MG capsule  Commonly known as:  PREVACID  Take 30 mg by mouth every other day.     levothyroxine 50 MCG tablet  Commonly known as:  SYNTHROID, LEVOTHROID  Take 50 mcg by mouth daily.     lisinopril-hydrochlorothiazide 20-12.5 MG per tablet  Commonly known as:  PRINZIDE,ZESTORETIC  Take 1 tablet by mouth daily.     trolamine salicylate 10 % cream  Commonly known as:  ASPERCREME  Apply 1 application topically daily as needed (for muscle pain).  VITAMIN D-3 PO  Take 1 tablet by mouth 2 (two) times a week.       Rx for plavix routed to pharmacy #[id number] with 6 RF  Disposition: home  Patient's condition: is Good  Follow up: 1. Dr. Myra Gianotti in 4 weeks   Doreatha Massed, PA-C [facility] [phone] 10/14/2012  8:00 AM

## 2012-10-22 NOTE — Discharge Summary (Signed)
I agree with the above  Ronnie Snyder 

## 2012-10-22 NOTE — Progress Notes (Signed)
I agree with the above  Wells Brabham 

## 2012-12-14 ENCOUNTER — Ambulatory Visit: Payer: Medicare Other | Admitting: Surgery

## 2012-12-15 DIAGNOSIS — M722 Plantar fascial fibromatosis: Secondary | ICD-10-CM | POA: Diagnosis not present

## 2012-12-15 DIAGNOSIS — M779 Enthesopathy, unspecified: Secondary | ICD-10-CM | POA: Diagnosis not present

## 2013-01-11 ENCOUNTER — Ambulatory Visit: Payer: Medicare Other | Admitting: Surgery

## 2013-01-29 ENCOUNTER — Encounter: Payer: Self-pay | Admitting: Family

## 2013-02-01 ENCOUNTER — Encounter (INDEPENDENT_AMBULATORY_CARE_PROVIDER_SITE_OTHER): Payer: Medicare Other | Admitting: *Deleted

## 2013-02-01 ENCOUNTER — Ambulatory Visit (INDEPENDENT_AMBULATORY_CARE_PROVIDER_SITE_OTHER): Payer: Medicare Other | Admitting: Family

## 2013-02-01 ENCOUNTER — Encounter: Payer: Self-pay | Admitting: Family

## 2013-02-01 VITALS — BP 175/85 | HR 77 | Resp 16 | Ht 69.0 in | Wt 155.0 lb

## 2013-02-01 DIAGNOSIS — M79609 Pain in unspecified limb: Secondary | ICD-10-CM | POA: Diagnosis not present

## 2013-02-01 DIAGNOSIS — I70219 Atherosclerosis of native arteries of extremities with intermittent claudication, unspecified extremity: Secondary | ICD-10-CM

## 2013-02-01 DIAGNOSIS — Z48812 Encounter for surgical aftercare following surgery on the circulatory system: Secondary | ICD-10-CM

## 2013-02-01 DIAGNOSIS — I739 Peripheral vascular disease, unspecified: Secondary | ICD-10-CM

## 2013-02-01 NOTE — Progress Notes (Signed)
[facility] [place] HISTORY AND PHYSICAL -PAD  Previous Bypass Surgery/ Stent Placement: Yes Surgeon:Brabham  History of Present Illness Ronnie Snyder is a 72 y.o. male status post left iliofemoral endarterectomy with bovine pericardial patch angioplasty on 01/23/2012. This was done by Dr. Edilia Bo since Dr. Myra Gianotti was called away to an emergency. This was done in the setting of left lower extremity claudication. On patient's visit in January of this year treatment options were discussed with the patient. Dr. Myra Gianotti felt these included continued medical management with the addition of Trental. He could also undergo repeat angiography and potential stent placement, versus surgical bypass. After a well informed discussion pt. And Dr. Myra Gianotti decided to try Trental. Patient did have a good response to Pletal but could not tolerate it. Hopefully he will be able to tolerate his new medication. Dr. Myra Gianotti also talked to patient about participating in [facility]. He was receptive. Patient was scheduled to come back in 6 months for followup with the advice that If his symptoms deteriorated between then and the scheduled follow up, that angiography would be done.  On  10/10/2012 patient underwent an aortogram by Dr. Myra Gianotti and the following was done: 1. ultrasound-guided access, right femoral artery  2. abdominal aortogram  3. left lower extremity runoff  4. atherectomy, left superficial femoral and popliteal artery  5. stent, left superficial femoral and popliteal   Patient complains of arthritis in feet and ankles and gets cortisone injections both ankles about every 6 months. He complains of resting pain in left shin, ankle, and calf. Patient feels he got some left calf relief about 3 weeks after the stent placement, then left calf, shin, ankle pain returned with walking. Patient states that both Trental and Pletal caused him to feel "swimmy headed" and is taking oxycodone,  1/2 tablet at HS or Tylenol or Advil for pain releif. Patient denies any claudication or other pain in the right leg. He does not walk much, but when he does the pain in the lower leg is worse. Is relieved somewhat by rest but still present. Has aching in lower leg at night, somewhat relieved by heat. Patient denies history of DVT or PE.   Patient denies non healing ulcers on lower extremities. Patient denies New Medical or Surgical History:    Pt Diabetic: No Pt smoker: smoker  (1 ppd x 60 yrs)  Pt meds include: Statin :No Betablocker: No ASA: No, rarely takes, advised to take 81 mg daily Other anticoagulants/antiplatelets: Plavix  Past Medical History  Diagnosis Date  . Hx of colonic polyps   . GERD (gastroesophageal reflux disease)   . HTN (hypertension)   . Hepatic steatosis   . Abdominal aortic aneurysm     3 cm aneurysm followed with serial ultrasound  . Hypothyroidism   . PAD (peripheral artery disease)   . High cholesterol     "at one time; it's fine now" (10/13/2012)  . H/O hiatal hernia   . Arthritis     "in my feet" (10/13/2012)  . Skin cancer     "burned them off my arm and such" (10/13/2012)    Social History History  Substance Use Topics  . Smoking status: Current Every Day Smoker -- 1.00 packs/day for 57 years    Types: Cigarettes  . Smokeless tobacco: Never Used     Comment: pt states there is no hope for quitting  . Alcohol Use: 3.6 oz/week    6 Cans of beer per week  Comment: 10/13/2012 "drink a 6 pack/wk"    Family History Family History  Problem Relation Age of Onset  . Hypertension Mother   . Hypertension Father   . Hypertension Sister   . Hypertension Sister     Past Surgical History  Procedure Laterality Date  . Esophagogastroduodenoscopy    . Gated spect wall motion stress cardiolite  02/10/2002  . Nose surgery    . Hip fracture surgery Right 1990's  . Femoral endarterectomy Left 01/23/12    Left Endarterectomy  with bovie patch  Angioplasy  . Inguinal hernia repair Right     "years ago" (10/13/2012)  . Tonsillectomy      "I was a chld" (10/13/2012)  . Angioplasty / stenting femoral Left 10/13/2012    Allergies  Allergen Reactions  . Vicodin [Hydrocodone-Acetaminophen] Nausea Only  . Atorvastatin Nausea Only    REACTION: sick  . Other     ALL PAINS/NARCOTIC MEDS-CANNOT TOLERATE WELL  . Pioglitazone Nausea Only  . Penicillins Rash    Current Outpatient Prescriptions  Medication Sig Dispense Refill  . acetaminophen (TYLENOL) 500 MG tablet Take 250 mg by mouth every 6 (six) hours as needed for pain.      Marland Kitchen amLODipine (NORVASC) 5 MG tablet Take 5 mg by mouth daily.      Marland Kitchen aspirin 81 MG tablet Take 81 mg by mouth every other day.       . Cholecalciferol (VITAMIN D-3 PO) Take 1 tablet by mouth 2 (two) times a week.       . clopidogrel (PLAVIX) 75 MG tablet Take 1 tablet (75 mg total) by mouth daily with breakfast.  30 tablet  6  . lansoprazole (PREVACID) 30 MG capsule Take 30 mg by mouth every other day.      . levothyroxine (SYNTHROID, LEVOTHROID) 50 MCG tablet Take 50 mcg by mouth daily.      Marland Kitchen lisinopril-hydrochlorothiazide (PRINZIDE,ZESTORETIC) 20-12.5 MG per tablet Take 1 tablet by mouth daily.  90 tablet  3  . trolamine salicylate (ASPERCREME) 10 % cream Apply 1 application topically daily as needed (for muscle pain).       No current facility-administered medications for this visit.    ROS: [x]  Positive   [ ]  Denies  General:[ ]  Weight loss,  [ ]  Weight gain, [ ]  Fever, [ ]  chills Neurologic: [ ]  Dizziness, [ ]  Blackouts, [ ]  Seizure [ ]  Stroke, [ ]  "Mini stroke", [ ]  Slurred speech, [ ]  Temporary blindness;  [ ] weakness, [ ]  Hoarseness Cardiac: [ ]  Chest pain/pressure, [ ]  Shortness of breath at rest [ ]  Shortness of breath with exertion,  [ ]   Atrial fibrillation or irregular heartbeat Vascular:[X ] Pain in legs with walking, [X ] Pain in legs at rest ,Arly.Keller ] Pain in legs at night,  [ ]   Non-healing  ulcer, [ ]  Blood clot in vein/DVT,   Pulmonary: [ ]  Home oxygen, [ ]   Productive cough, [ ]  Coughing up blood,  [ ]  Asthma,  [ ]  Wheezing Musculoskeletal:  Arly.Keller ] Arthritis, Arly.Keller ] Low back pain,  Arly.Keller ] Joint pain Hematologic:[ ]  Easy Bruising, [ ]  Anemia; [ ]  Hepatitis Gastrointestinal: [ ]  Blood in stool,  [ ]  Gastroesophageal Reflux, [ ]  Trouble swallowing Urinary: [ ]  chronic Kidney disease, [ ]  on HD, [ ]  Burning with urination, [ ]  Frequent urination, [ ]  Difficulty urinating;  Skin: [ ]  Rashes, [ ]  Wounds     Physical Examination  Filed Vitals:  02/01/13 1109  BP: 175/85  Pulse: 77  Resp: 16   Filed Weights   02/01/13 1109  Weight: 155 lb (70.308 kg)   Body mass index is 22.88 kg/(m^2).  General: A&O x 3, WDWN,  Gait: normal Eyes: PERRLA, Pulmonary: CTAB, without wheezes , rales or rhonchi Cardiac: regular Rythm , without murmur          Carotid Bruits Left Right   Negative Negative    Radial pulses palpable bilaterally, 2+                         VASCULAR EXAM: Extremities without ischemic changes  without Gangrene; without open wound.                                                                                                          LE Pulses LEFT RIGHT  AORTA          FEMORAL   palpable   palpable        POPLITEAL  not palpable   not palpable       POSTERIOR TIBIAL   palpable   biphasic by Doppler and not palpable        DORSALIS PEDIS      ANTERIOR TIBIAL  palpable  biphasic by Doppler and not palpable    Abdomen: soft, NT, no masses Skin: no rashes, ulcers noted Musculoskeletal: no muscle wasting or atrophy  Neurologic: A&O X 3; Appropriate Affect ; SENSATION: normal; MOTOR FUNCTION:  moving all extremities equally. Speech is fluent/normal. CN 2-12 intact.    Non-Invasive Vascular Imaging: DATE: 02/01/2013 ABI: RIGHT 0.86, Waveforms: triphasic;  LEFT 1.17, Waveforms: triphasic DUPLEX SCAN OF BYPASS: Widely patent  left superficial femoral and  popliteal stent without restenosis. Dilated CFA, normal post op changes. Mild plaque in bulb.  ASSESSMENT: DEREL MCGLASSON is a 72 y.o. male who presents with left lower leg pain that is not related to arterial ischemia as demonstrated by left leg Duplex and ABI's. Physical exam, HPI, and non-invasive vascular study results discussed with Dr. Myra Gianotti. The pain he has in his left leg is not from lack of arterial blood flow as his ABI's and LE Duplex demonstrate widely patent left superficial femoral and popliteal stent including inflow and outflow.  Patient was advised to follow up with his primary care provider to investigate other reasons, possibly orthopedic, for his left leg shin, knee, and ankle pain.  PLAN:  I discussed in depth with the patient the nature of atherosclerosis, and emphasized the importance of maximal medical management including strict control of blood pressure, blood glucose, and lipid levels, obtaining regular exercise, and cessation of smoking.  The patient is aware that without maximal medical management the underlying atherosclerotic disease process will progress, limiting the benefit of any interventions. Based on the patient's vascular studies and examination, and on surveillance criteria, and after discussion with Dr. Myra Gianotti,  pt will return to clinic in 6  months for ABI's and left LE Duplex.   The patient was given  information about PAD including signs, symptoms, treatment, what symptoms should prompt the patient to seek immediate medical care, and risk reduction measures to take.He was also counseled re smoking cessation and given written information re this.  Charisse March, RN, MSN, FNP-C Office Phone: [phone]  Clinic MD: Myra Gianotti  02/01/2013 10:59 AM

## 2013-02-01 NOTE — Patient Instructions (Addendum)
Peripheral Vascular Disease Peripheral Vascular Disease (PVD), also called Peripheral Arterial Disease (PAD), is a circulation problem caused by cholesterol (atherosclerotic plaque) deposits in the arteries. PVD commonly occurs in the lower extremities (legs) but it can occur in other areas of the body, such as your arms. The cholesterol buildup in the arteries reduces blood flow which can cause pain and other serious problems. The presence of PVD can place a person at risk for Coronary Artery Disease (CAD).  CAUSES  Causes of PVD can be many. It is usually associated with more than one risk factor such as:   High Cholesterol.  Smoking.  Diabetes.  Lack of exercise or inactivity.  High blood pressure (hypertension).  Obesity.  Family history. SYMPTOMS   When the lower extremities are affected, patients with PVD may experience:  Leg pain with exertion or physical activity. This is called INTERMITTENT CLAUDICATION. This may present as cramping or numbness with physical activity. The location of the pain is associated with the level of blockage. For example, blockage at the abdominal level (distal abdominal aorta) may result in buttock or hip pain. Lower leg arterial blockage may result in calf pain.  As PVD becomes more severe, pain can develop with less physical activity.  In people with severe PVD, leg pain may occur at rest.  Other PVD signs and symptoms:  Leg numbness or weakness.  Coldness in the affected leg or foot, especially when compared to the other leg.  A change in leg color.  Patients with significant PVD are more prone to ulcers or sores on toes, feet or legs. These may take longer to heal or may reoccur. The ulcers or sores can become infected.  If signs and symptoms of PVD are ignored, gangrene may occur. This can result in the loss of toes or loss of an entire limb.  Not all leg pain is related to PVD. Other medical conditions can cause leg pain such  as:  Blood clots (embolism) or Deep Vein Thrombosis.  Inflammation of the blood vessels (vasculitis).  Spinal stenosis. DIAGNOSIS  Diagnosis of PVD can involve several different types of tests. These can include:  Pulse Volume Recording Method (PVR). This test is simple, painless and does not involve the use of X-rays. PVR involves measuring and comparing the blood pressure in the arms and legs. An ABI (Ankle-Brachial Index) is calculated. The normal ratio of blood pressures is 1. As this number becomes smaller, it indicates more severe disease.  < 0.95  indicates significant narrowing in one or more leg vessels.  <0.8 there will usually be pain in the foot, leg or buttock with exercise.  <0.4 will usually have pain in the legs at rest.  <0.25  usually indicates limb threatening PVD.  Doppler detection of pulses in the legs. This test is painless and checks to see if you have a pulses in your legs/feet.  A dye or contrast material (a substance that highlights the blood vessels so they show up on x-ray) may be given to help your caregiver better see the arteries for the following tests. The dye is eliminated from your body by the kidney's. Your caregiver may order blood work to check your kidney function and other laboratory values before the following tests are performed:  Magnetic Resonance Angiography (MRA). An MRA is a picture study of the blood vessels and arteries. The MRA machine uses a large magnet to produce images of the blood vessels.  Computed Tomography Angiography (CTA). A CTA is a   specialized x-ray that looks at how the blood flows in your blood vessels. An IV may be inserted into your arm so contrast dye can be injected.  Angiogram. Is a procedure that uses x-rays to look at your blood vessels. This procedure is minimally invasive, meaning a small incision (cut) is made in your groin. A small tube (catheter) is then inserted into the artery of your groin. The catheter is  guided to the blood vessel or artery your caregiver wants to examine. Contrast dye is injected into the catheter. X-rays are then taken of the blood vessel or artery. After the images are obtained, the catheter is taken out. TREATMENT  Treatment of PVD involves many interventions which may include:  Lifestyle changes:  Quitting smoking.  Exercise.  Following a low fat, low cholesterol diet.  Control of diabetes.  Foot care is very important to the PVD patient. Good foot care can help prevent infection.  Medication:  Cholesterol-lowering medicine.  Blood pressure medicine.  Anti-platelet drugs.  Certain medicines may reduce symptoms of Intermittent Claudication.  Interventional/Surgical options:  Angioplasty. An Angioplasty is a procedure that inflates a balloon in the blocked artery. This opens the blocked artery to improve blood flow.  Stent Implant. A wire mesh tube (stent) is placed in the artery. The stent expands and stays in place, allowing the artery to remain open.  Peripheral Bypass Surgery. This is a surgical procedure that reroutes the blood around a blocked artery to help improve blood flow. This type of procedure may be performed if Angioplasty or stent implants are not an option. SEEK IMMEDIATE MEDICAL CARE IF:   You develop pain or numbness in your arms or legs.  Your arm or leg turns cold, becomes blue in color.  You develop redness, warmth, swelling and pain in your arms or legs. MAKE SURE YOU:   Understand these instructions.  Will watch your condition.  Will get help right away if you are not doing well or get worse. Document Released: 06/13/2004 Document Revised: 07/29/2011 Document Reviewed: 05/10/2008 ExitCare Patient Information 2014 [employer].   Smoking Cessation Quitting smoking is important to your health and has many advantages. However, it is not always easy to quit since nicotine is a very addictive drug. Often times, people try 3  times or more before being able to quit. This document explains the best ways for you to prepare to quit smoking. Quitting takes hard work and a lot of effort, but you can do it. ADVANTAGES OF QUITTING SMOKING  You will live longer, feel better, and live better.  Your body will feel the impact of quitting smoking almost immediately.  Within 20 minutes, blood pressure decreases. Your pulse returns to its normal level.  After 8 hours, carbon monoxide levels in the blood return to normal. Your oxygen level increases.  After 24 hours, the chance of having a heart attack starts to decrease. Your breath, hair, and body stop smelling like smoke.  After 48 hours, damaged nerve endings begin to recover. Your sense of taste and smell improve.  After 72 hours, the body is virtually free of nicotine. Your bronchial tubes relax and breathing becomes easier.  After 2 to 12 weeks, lungs can hold more air. Exercise becomes easier and circulation improves.  The risk of having a heart attack, stroke, cancer, or lung disease is greatly reduced.  After 1 year, the risk of coronary heart disease is cut in half.  After 5 years, the risk of stroke falls to   the same as a nonsmoker.  After 10 years, the risk of lung cancer is cut in half and the risk of other cancers decreases significantly.  After 15 years, the risk of coronary heart disease drops, usually to the level of a nonsmoker.  If you are pregnant, quitting smoking will improve your chances of having a healthy baby.  The people you live with, especially any children, will be healthier.  You will have extra money to spend on things other than cigarettes. QUESTIONS TO THINK ABOUT BEFORE ATTEMPTING TO QUIT You may want to talk about your answers with your caregiver.  Why do you want to quit?  If you tried to quit in the past, what helped and what did not?  What will be the most difficult situations for you after you quit? How will you plan to  handle them?  Who can help you through the tough times? Your family? Friends? A caregiver?  What pleasures do you get from smoking? What ways can you still get pleasure if you quit? Here are some questions to ask your caregiver:  How can you help me to be successful at quitting?  What medicine do you think would be best for me and how should I take it?  What should I do if I need more help?  What is smoking withdrawal like? How can I get information on withdrawal? GET READY  Set a quit date.  Change your environment by getting rid of all cigarettes, ashtrays, matches, and lighters in your home, car, or work. Do not let people smoke in your home.  Review your past attempts to quit. Think about what worked and what did not. GET SUPPORT AND ENCOURAGEMENT You have a better chance of being successful if you have help. You can get support in many ways.  Tell your family, friends, and co-workers that you are going to quit and need their support. Ask them not to smoke around you.  Get individual, group, or telephone counseling and support. Programs are available at local hospitals and health centers. Call your local health department for information about programs in your area.  Spiritual beliefs and practices may help some smokers quit.  Download a "quit meter" on your computer to keep track of quit statistics, such as how long you have gone without smoking, cigarettes not smoked, and money saved.  Get a self-help book about quitting smoking and staying off of tobacco. LEARN NEW SKILLS AND BEHAVIORS  Distract yourself from urges to smoke. Talk to someone, go for a walk, or occupy your time with a task.  Change your normal routine. Take a different route to work. Drink tea instead of coffee. Eat breakfast in a different place.  Reduce your stress. Take a hot bath, exercise, or read a book.  Plan something enjoyable to do every day. Reward yourself for not smoking.  Explore  interactive web-based programs that specialize in helping you quit. GET MEDICINE AND USE IT CORRECTLY Medicines can help you stop smoking and decrease the urge to smoke. Combining medicine with the above behavioral methods and support can greatly increase your chances of successfully quitting smoking.  Nicotine replacement therapy helps deliver nicotine to your body without the negative effects and risks of smoking. Nicotine replacement therapy includes nicotine gum, lozenges, inhalers, nasal sprays, and skin patches. Some may be available over-the-counter and others require a prescription.  Antidepressant medicine helps people abstain from smoking, but how this works is unknown. This medicine is available by prescription.    Nicotinic receptor partial agonist medicine simulates the effect of nicotine in your brain. This medicine is available by prescription. Ask your caregiver for advice about which medicines to use and how to use them based on your health history. Your caregiver will tell you what side effects to look out for if you choose to be on a medicine or therapy. Carefully read the information on the package. Do not use any other product containing nicotine while using a nicotine replacement product.  RELAPSE OR DIFFICULT SITUATIONS Most relapses occur within the first 3 months after quitting. Do not be discouraged if you start smoking again. Remember, most people try several times before finally quitting. You may have symptoms of withdrawal because your body is used to nicotine. You may crave cigarettes, be irritable, feel very hungry, cough often, get headaches, or have difficulty concentrating. The withdrawal symptoms are only temporary. They are strongest when you first quit, but they will go away within 10 14 days. To reduce the chances of relapse, try to:  Avoid drinking alcohol. Drinking lowers your chances of successfully quitting.  Reduce the amount of caffeine you consume. Once you  quit smoking, the amount of caffeine in your body increases and can give you symptoms, such as a rapid heartbeat, sweating, and anxiety.  Avoid smokers because they can make you want to smoke.  Do not let weight gain distract you. Many smokers will gain weight when they quit, usually less than 10 pounds. Eat a healthy diet and stay active. You can always lose the weight gained after you quit.  Find ways to improve your mood other than smoking. FOR MORE INFORMATION  www.smokefree.gov  Document Released: 04/30/2001 Document Revised: 11/05/2011 Document Reviewed: 08/15/2011 [employer] Patient Information 2014 [employer].  

## 2013-02-02 ENCOUNTER — Other Ambulatory Visit: Payer: Self-pay | Admitting: *Deleted

## 2013-02-02 DIAGNOSIS — Z48812 Encounter for surgical aftercare following surgery on the circulatory system: Secondary | ICD-10-CM

## 2013-02-02 DIAGNOSIS — I739 Peripheral vascular disease, unspecified: Secondary | ICD-10-CM

## 2013-03-24 DIAGNOSIS — D235 Other benign neoplasm of skin of trunk: Secondary | ICD-10-CM | POA: Diagnosis not present

## 2013-03-24 DIAGNOSIS — L988 Other specified disorders of the skin and subcutaneous tissue: Secondary | ICD-10-CM | POA: Diagnosis not present

## 2013-03-24 DIAGNOSIS — L570 Actinic keratosis: Secondary | ICD-10-CM | POA: Diagnosis not present

## 2013-03-24 DIAGNOSIS — L821 Other seborrheic keratosis: Secondary | ICD-10-CM | POA: Diagnosis not present

## 2013-03-25 ENCOUNTER — Other Ambulatory Visit: Payer: Self-pay

## 2013-05-04 ENCOUNTER — Encounter: Payer: Self-pay | Admitting: Podiatry

## 2013-05-04 ENCOUNTER — Ambulatory Visit (INDEPENDENT_AMBULATORY_CARE_PROVIDER_SITE_OTHER): Payer: Medicare Other

## 2013-05-04 ENCOUNTER — Ambulatory Visit (INDEPENDENT_AMBULATORY_CARE_PROVIDER_SITE_OTHER): Payer: Medicare Other | Admitting: Podiatry

## 2013-05-04 VITALS — BP 145/80 | HR 75 | Resp 16

## 2013-05-04 DIAGNOSIS — M722 Plantar fascial fibromatosis: Secondary | ICD-10-CM

## 2013-05-04 DIAGNOSIS — M79672 Pain in left foot: Secondary | ICD-10-CM

## 2013-05-04 DIAGNOSIS — M79609 Pain in unspecified limb: Secondary | ICD-10-CM | POA: Diagnosis not present

## 2013-05-04 NOTE — Progress Notes (Signed)
   Subjective:    Patient ID: Ronnie Snyder, male    DOB: 1940-08-14, 72 y.o.   MRN: [id number]  HPI Comments: Left heel started hurting a couple of weeks ago   Foot Pain      Review of Systems     Objective:   Physical Exam: Pulses are strongly palpable left foot. Vital signs are stable he is alert oriented x3 pain on palpation medial continued tubercle of the left heel demonstrates symptoms similar to plantar fasciitis. Graphic evaluation does demonstrate a plantar distally oriented calcaneal spur with soft tissue increase in density at the plantar fascial calcaneal insertion site.        Assessment & Plan:  Assessment plantar fasciitis left.  Plan: Injected his left heel with Kenalog and local anesthetic. Apply plantar fascial strapping. Dispensed a night splint. Gave both oral and written home-going instructions for soaking and stretching. We discussed appropriate shoe gear stretching exercises ice therapy and shoe gear modifications. I will followup with him in one month to

## 2013-05-10 ENCOUNTER — Other Ambulatory Visit: Payer: Self-pay | Admitting: *Deleted

## 2013-05-10 DIAGNOSIS — I739 Peripheral vascular disease, unspecified: Secondary | ICD-10-CM

## 2013-05-10 MED ORDER — CLOPIDOGREL BISULFATE 75 MG PO TABS: 75.0000 mg | ORAL_TABLET | Freq: Every day | ORAL | Status: DC

## 2013-06-01 ENCOUNTER — Ambulatory Visit: Payer: Medicare Other | Admitting: Podiatry

## 2013-06-03 ENCOUNTER — Ambulatory Visit (INDEPENDENT_AMBULATORY_CARE_PROVIDER_SITE_OTHER): Payer: Medicare Other | Admitting: Podiatry

## 2013-06-03 ENCOUNTER — Encounter: Payer: Self-pay | Admitting: Podiatry

## 2013-06-03 VITALS — BP 142/77 | HR 79 | Resp 12

## 2013-06-03 DIAGNOSIS — M722 Plantar fascial fibromatosis: Secondary | ICD-10-CM

## 2013-06-03 NOTE — Progress Notes (Signed)
   Subjective:    Patient ID: Ronnie Snyder, male    DOB: December 18, 1940, 73 y.o.   MRN: [id number]  HPI Comments: '' LT FOOT IS STILL SORE.''     Review of Systems     Objective:   Physical Exam: Vital signs are stable he is alert and oriented x3. Pulses are palpable left lower extremity. Pain on palpation medial continued tubercle of the left heel. No pain on medial and lateral compression of the left calcaneus.          Assessment & Plan:  Assessment: Chronic intractable plantar fasciitis left.  Plan: Reinjected the left heel again today Kenalog and local anesthetic. He will continue all other conservative therapies including night splint and anti-inflammatories. He also utilized ice therapy and plantar fascial strapping was provided today I will followup with him in one month for a pair orthotics if necessary.

## 2013-06-24 ENCOUNTER — Other Ambulatory Visit: Payer: Self-pay | Admitting: Surgery

## 2013-06-24 DIAGNOSIS — I739 Peripheral vascular disease, unspecified: Secondary | ICD-10-CM

## 2013-06-24 DIAGNOSIS — Z48812 Encounter for surgical aftercare following surgery on the circulatory system: Secondary | ICD-10-CM

## 2013-07-01 ENCOUNTER — Ambulatory Visit (INDEPENDENT_AMBULATORY_CARE_PROVIDER_SITE_OTHER): Payer: Medicare Other | Admitting: Podiatry

## 2013-07-01 ENCOUNTER — Encounter: Payer: Self-pay | Admitting: Podiatry

## 2013-07-01 VITALS — BP 141/77 | HR 69 | Resp 18

## 2013-07-01 DIAGNOSIS — M7750 Other enthesopathy of unspecified foot: Secondary | ICD-10-CM | POA: Diagnosis not present

## 2013-07-01 NOTE — Progress Notes (Signed)
The heel is a lot better maybe 80-90% better , its my ankle from walking different that is bothering me. He denies any trauma to the ankles he states that he has arthritis and pain with walking.  Objective: Vital signs are stable he is alert and oriented x3. There is no erythema edema saline is drainage or odor to either foot. He has no pain on palpation medial calcaneal tubercle of the left heel. He has tenderness on palpation of the anterior ankle with soft tissue increase in density in this area consistent with an effusion.  Assessment: Resolving plantar fasciitis left. Capsulitis of the ankle joint with ankle effusion.  Plan: Injection of the ankle today after sterile Betadine skin prep anterior lateral ankle approach with Kenalog and local anesthetic.

## 2013-07-26 ENCOUNTER — Other Ambulatory Visit: Payer: Self-pay | Admitting: Endocrinology

## 2013-08-06 ENCOUNTER — Encounter: Payer: Self-pay | Admitting: Family

## 2013-08-09 ENCOUNTER — Encounter: Payer: Self-pay | Admitting: Family

## 2013-08-09 ENCOUNTER — Ambulatory Visit (INDEPENDENT_AMBULATORY_CARE_PROVIDER_SITE_OTHER)
Admission: RE | Admit: 2013-08-09 | Discharge: 2013-08-09 | Disposition: A | Discharge status: Home / Self Care | Payer: Medicare Other | Source: Ambulatory Visit | Attending: Family | Admitting: Family

## 2013-08-09 ENCOUNTER — Ambulatory Visit (HOSPITAL_COMMUNITY)
Admission: RE | Admit: 2013-08-09 | Discharge: 2013-08-09 | Disposition: A | Discharge status: Home / Self Care | Payer: Medicare Other | Source: Ambulatory Visit | Attending: Family | Admitting: Family

## 2013-08-09 ENCOUNTER — Ambulatory Visit (INDEPENDENT_AMBULATORY_CARE_PROVIDER_SITE_OTHER): Payer: Medicare Other | Admitting: Family

## 2013-08-09 VITALS — BP 161/85 | HR 72 | Resp 18 | Ht 69.0 in | Wt 157.3 lb

## 2013-08-09 DIAGNOSIS — Z01818 Encounter for other preprocedural examination: Secondary | ICD-10-CM | POA: Insufficient documentation

## 2013-08-09 DIAGNOSIS — Z48812 Encounter for surgical aftercare following surgery on the circulatory system: Secondary | ICD-10-CM | POA: Diagnosis not present

## 2013-08-09 DIAGNOSIS — I739 Peripheral vascular disease, unspecified: Secondary | ICD-10-CM | POA: Insufficient documentation

## 2013-08-09 NOTE — Patient Instructions (Addendum)
Peripheral Vascular Disease Peripheral Vascular Disease (PVD), also called Peripheral Arterial Disease (PAD), is a circulation problem caused by cholesterol (atherosclerotic plaque) deposits in the arteries. PVD commonly occurs in the lower extremities (legs) but it can occur in other areas of the body, such as your arms. The cholesterol buildup in the arteries reduces blood flow which can cause pain and other serious problems. The presence of PVD can place a person at risk for Coronary Artery Disease (CAD).  CAUSES  Causes of PVD can be many. It is usually associated with more than one risk factor such as:   High Cholesterol.  Smoking.  Diabetes.  Lack of exercise or inactivity.  High blood pressure (hypertension).  Obesity.  Family history. SYMPTOMS   When the lower extremities are affected, patients with PVD may experience:  Leg pain with exertion or physical activity. This is called INTERMITTENT CLAUDICATION. This may present as cramping or numbness with physical activity. The location of the pain is associated with the level of blockage. For example, blockage at the abdominal level (distal abdominal aorta) may result in buttock or hip pain. Lower leg arterial blockage may result in calf pain.  As PVD becomes more severe, pain can develop with less physical activity.  In people with severe PVD, leg pain may occur at rest.  Other PVD signs and symptoms:  Leg numbness or weakness.  Coldness in the affected leg or foot, especially when compared to the other leg.  A change in leg color.  Patients with significant PVD are more prone to ulcers or sores on toes, feet or legs. These may take longer to heal or may reoccur. The ulcers or sores can become infected.  If signs and symptoms of PVD are ignored, gangrene may occur. This can result in the loss of toes or loss of an entire limb.  Not all leg pain is related to PVD. Other medical conditions can cause leg pain such  as:  Blood clots (embolism) or Deep Vein Thrombosis.  Inflammation of the blood vessels (vasculitis).  Spinal stenosis. DIAGNOSIS  Diagnosis of PVD can involve several different types of tests. These can include:  Pulse Volume Recording Method (PVR). This test is simple, painless and does not involve the use of X-rays. PVR involves measuring and comparing the blood pressure in the arms and legs. An ABI (Ankle-Brachial Index) is calculated. The normal ratio of blood pressures is 1. As this number becomes smaller, it indicates more severe disease.  < 0.95  indicates significant narrowing in one or more leg vessels.  <0.8 there will usually be pain in the foot, leg or buttock with exercise.  <0.4 will usually have pain in the legs at rest.  <0.25  usually indicates limb threatening PVD.  Doppler detection of pulses in the legs. This test is painless and checks to see if you have a pulses in your legs/feet.  A dye or contrast material (a substance that highlights the blood vessels so they show up on x-ray) may be given to help your caregiver better see the arteries for the following tests. The dye is eliminated from your body by the kidney's. Your caregiver may order blood work to check your kidney function and other laboratory values before the following tests are performed:  Magnetic Resonance Angiography (MRA). An MRA is a picture study of the blood vessels and arteries. The MRA machine uses a large magnet to produce images of the blood vessels.  Computed Tomography Angiography (CTA). A CTA is a   specialized x-ray that looks at how the blood flows in your blood vessels. An IV may be inserted into your arm so contrast dye can be injected.  Angiogram. Is a procedure that uses x-rays to look at your blood vessels. This procedure is minimally invasive, meaning a small incision (cut) is made in your groin. A small tube (catheter) is then inserted into the artery of your groin. The catheter is  guided to the blood vessel or artery your caregiver wants to examine. Contrast dye is injected into the catheter. X-rays are then taken of the blood vessel or artery. After the images are obtained, the catheter is taken out. TREATMENT  Treatment of PVD involves many interventions which may include:  Lifestyle changes:  Quitting smoking.  Exercise.  Following a low fat, low cholesterol diet.  Control of diabetes.  Foot care is very important to the PVD patient. Good foot care can help prevent infection.  Medication:  Cholesterol-lowering medicine.  Blood pressure medicine.  Anti-platelet drugs.  Certain medicines may reduce symptoms of Intermittent Claudication.  Interventional/Surgical options:  Angioplasty. An Angioplasty is a procedure that inflates a balloon in the blocked artery. This opens the blocked artery to improve blood flow.  Stent Implant. A wire mesh tube (stent) is placed in the artery. The stent expands and stays in place, allowing the artery to remain open.  Peripheral Bypass Surgery. This is a surgical procedure that reroutes the blood around a blocked artery to help improve blood flow. This type of procedure may be performed if Angioplasty or stent implants are not an option. SEEK IMMEDIATE MEDICAL CARE IF:   You develop pain or numbness in your arms or legs.  Your arm or leg turns cold, becomes blue in color.  You develop redness, warmth, swelling and pain in your arms or legs. MAKE SURE YOU:   Understand these instructions.  Will watch your condition.  Will get help right away if you are not doing well or get worse. Document Released: 06/13/2004 Document Revised: 07/29/2011 Document Reviewed: 05/10/2008 ExitCare Patient Information 2014 [employer].   Smoking Cessation Quitting smoking is important to your health and has many advantages. However, it is not always easy to quit since nicotine is a very addictive drug. Often times, people try 3  times or more before being able to quit. This document explains the best ways for you to prepare to quit smoking. Quitting takes hard work and a lot of effort, but you can do it. ADVANTAGES OF QUITTING SMOKING  You will live longer, feel better, and live better.  Your body will feel the impact of quitting smoking almost immediately.  Within 20 minutes, blood pressure decreases. Your pulse returns to its normal level.  After 8 hours, carbon monoxide levels in the blood return to normal. Your oxygen level increases.  After 24 hours, the chance of having a heart attack starts to decrease. Your breath, hair, and body stop smelling like smoke.  After 48 hours, damaged nerve endings begin to recover. Your sense of taste and smell improve.  After 72 hours, the body is virtually free of nicotine. Your bronchial tubes relax and breathing becomes easier.  After 2 to 12 weeks, lungs can hold more air. Exercise becomes easier and circulation improves.  The risk of having a heart attack, stroke, cancer, or lung disease is greatly reduced.  After 1 year, the risk of coronary heart disease is cut in half.  After 5 years, the risk of stroke falls to   the same as a nonsmoker.  After 10 years, the risk of lung cancer is cut in half and the risk of other cancers decreases significantly.  After 15 years, the risk of coronary heart disease drops, usually to the level of a nonsmoker.  If you are pregnant, quitting smoking will improve your chances of having a healthy baby.  The people you live with, especially any children, will be healthier.  You will have extra money to spend on things other than cigarettes. QUESTIONS TO THINK ABOUT BEFORE ATTEMPTING TO QUIT You may want to talk about your answers with your caregiver.  Why do you want to quit?  If you tried to quit in the past, what helped and what did not?  What will be the most difficult situations for you after you quit? How will you plan to  handle them?  Who can help you through the tough times? Your family? Friends? A caregiver?  What pleasures do you get from smoking? What ways can you still get pleasure if you quit? Here are some questions to ask your caregiver:  How can you help me to be successful at quitting?  What medicine do you think would be best for me and how should I take it?  What should I do if I need more help?  What is smoking withdrawal like? How can I get information on withdrawal? GET READY  Set a quit date.  Change your environment by getting rid of all cigarettes, ashtrays, matches, and lighters in your home, car, or work. Do not let people smoke in your home.  Review your past attempts to quit. Think about what worked and what did not. GET SUPPORT AND ENCOURAGEMENT You have a better chance of being successful if you have help. You can get support in many ways.  Tell your family, friends, and co-workers that you are going to quit and need their support. Ask them not to smoke around you.  Get individual, group, or telephone counseling and support. Programs are available at local hospitals and health centers. Call your local health department for information about programs in your area.  Spiritual beliefs and practices may help some smokers quit.  Download a "quit meter" on your computer to keep track of quit statistics, such as how long you have gone without smoking, cigarettes not smoked, and money saved.  Get a self-help book about quitting smoking and staying off of tobacco. LEARN NEW SKILLS AND BEHAVIORS  Distract yourself from urges to smoke. Talk to someone, go for a walk, or occupy your time with a task.  Change your normal routine. Take a different route to work. Drink tea instead of coffee. Eat breakfast in a different place.  Reduce your stress. Take a hot bath, exercise, or read a book.  Plan something enjoyable to do every day. Reward yourself for not smoking.  Explore  interactive web-based programs that specialize in helping you quit. GET MEDICINE AND USE IT CORRECTLY Medicines can help you stop smoking and decrease the urge to smoke. Combining medicine with the above behavioral methods and support can greatly increase your chances of successfully quitting smoking.  Nicotine replacement therapy helps deliver nicotine to your body without the negative effects and risks of smoking. Nicotine replacement therapy includes nicotine gum, lozenges, inhalers, nasal sprays, and skin patches. Some may be available over-the-counter and others require a prescription.  Antidepressant medicine helps people abstain from smoking, but how this works is unknown. This medicine is available by prescription.    Nicotinic receptor partial agonist medicine simulates the effect of nicotine in your brain. This medicine is available by prescription. Ask your caregiver for advice about which medicines to use and how to use them based on your health history. Your caregiver will tell you what side effects to look out for if you choose to be on a medicine or therapy. Carefully read the information on the package. Do not use any other product containing nicotine while using a nicotine replacement product.  RELAPSE OR DIFFICULT SITUATIONS Most relapses occur within the first 3 months after quitting. Do not be discouraged if you start smoking again. Remember, most people try several times before finally quitting. You may have symptoms of withdrawal because your body is used to nicotine. You may crave cigarettes, be irritable, feel very hungry, cough often, get headaches, or have difficulty concentrating. The withdrawal symptoms are only temporary. They are strongest when you first quit, but they will go away within 10 14 days. To reduce the chances of relapse, try to:  Avoid drinking alcohol. Drinking lowers your chances of successfully quitting.  Reduce the amount of caffeine you consume. Once you  quit smoking, the amount of caffeine in your body increases and can give you symptoms, such as a rapid heartbeat, sweating, and anxiety.  Avoid smokers because they can make you want to smoke.  Do not let weight gain distract you. Many smokers will gain weight when they quit, usually less than 10 pounds. Eat a healthy diet and stay active. You can always lose the weight gained after you quit.  Find ways to improve your mood other than smoking. FOR MORE INFORMATION  www.smokefree.gov  Document Released: 04/30/2001 Document Revised: 11/05/2011 Document Reviewed: 08/15/2011 [employer] Patient Information 2014 [employer].  
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
## 2013-08-10 ENCOUNTER — Telehealth: Payer: Self-pay | Admitting: Family

## 2013-08-10 NOTE — Telephone Encounter (Signed)
Message copied by Gena Fray on Tue Aug 10, 2013  4:46 PM ------      Message from: Viann Fish      Created: Mon Aug 09, 2013  7:43 PM      Regarding: return in 6 months instead of 1 year, order bilateral LE arterial Duplex instead of left       Dear Ronnie Snyder, Ronnie Snyder, scheduling folks,      Please call pt and let him know that after further thought, I think he should return in 6 months instead of 1 year since he still smokes and the blockage in his right leg was worse this last visit.            (will change to bilateral instead of left LE arterial Duplex). ------

## 2013-08-10 NOTE — Addendum Note (Signed)
Addended by: Mena Goes on: 08/10/2013 12:29 PM   Modules accepted: Orders

## 2013-09-15 ENCOUNTER — Other Ambulatory Visit: Payer: Self-pay | Admitting: Endocrinology

## 2013-09-15 ENCOUNTER — Other Ambulatory Visit: Payer: Self-pay | Admitting: *Deleted

## 2013-09-15 MED ORDER — LISINOPRIL-HYDROCHLOROTHIAZIDE 20-12.5 MG PO TABS: 1.0000 | ORAL_TABLET | Freq: Every day | ORAL | Status: DC

## 2013-09-23 ENCOUNTER — Ambulatory Visit
Admission: RE | Admit: 2013-09-23 | Discharge: 2013-09-23 | Disposition: A | Discharge status: Home / Self Care | Payer: Medicare Other | Source: Ambulatory Visit | Attending: Endocrinology | Admitting: Endocrinology

## 2013-09-23 ENCOUNTER — Ambulatory Visit (INDEPENDENT_AMBULATORY_CARE_PROVIDER_SITE_OTHER): Payer: Medicare Other | Admitting: Endocrinology

## 2013-09-23 ENCOUNTER — Encounter: Payer: Self-pay | Admitting: Endocrinology

## 2013-09-23 VITALS — BP 130/70 | HR 68 | Temp 98.4°F | Ht 69.0 in | Wt 163.0 lb

## 2013-09-23 DIAGNOSIS — R05 Cough: Secondary | ICD-10-CM | POA: Diagnosis not present

## 2013-09-23 DIAGNOSIS — R059 Cough, unspecified: Secondary | ICD-10-CM

## 2013-09-23 DIAGNOSIS — D692 Other nonthrombocytopenic purpura: Secondary | ICD-10-CM | POA: Insufficient documentation

## 2013-09-23 DIAGNOSIS — E1159 Type 2 diabetes mellitus with other circulatory complications: Secondary | ICD-10-CM | POA: Diagnosis not present

## 2013-09-23 DIAGNOSIS — I998 Other disorder of circulatory system: Secondary | ICD-10-CM

## 2013-09-23 DIAGNOSIS — E871 Hypo-osmolality and hyponatremia: Secondary | ICD-10-CM

## 2013-09-23 DIAGNOSIS — Z125 Encounter for screening for malignant neoplasm of prostate: Secondary | ICD-10-CM | POA: Diagnosis not present

## 2013-09-23 DIAGNOSIS — E7800 Pure hypercholesterolemia, unspecified: Secondary | ICD-10-CM | POA: Diagnosis not present

## 2013-09-23 DIAGNOSIS — R58 Hemorrhage, not elsewhere classified: Secondary | ICD-10-CM

## 2013-09-23 DIAGNOSIS — E119 Type 2 diabetes mellitus without complications: Secondary | ICD-10-CM

## 2013-09-23 DIAGNOSIS — I10 Essential (primary) hypertension: Secondary | ICD-10-CM | POA: Diagnosis not present

## 2013-09-23 DIAGNOSIS — Z79899 Other long term (current) drug therapy: Secondary | ICD-10-CM

## 2013-09-23 LAB — URINALYSIS, ROUTINE W REFLEX MICROSCOPIC
BILIRUBIN URINE: NEGATIVE
Hgb urine dipstick: NEGATIVE
KETONES UR: NEGATIVE
Leukocytes, UA: NEGATIVE
Nitrite: NEGATIVE
PH: 6 (ref 5.0–8.0)
RBC / HPF: NONE SEEN (ref 0–?)
TOTAL PROTEIN, URINE-UPE24: NEGATIVE
Urine Glucose: NEGATIVE
Urobilinogen, UA: 0.2 (ref 0.0–1.0)
WBC, UA: NONE SEEN (ref 0–?)

## 2013-09-23 LAB — CBC WITH DIFFERENTIAL/PLATELET
BASOS PCT: 0.6 % (ref 0.0–3.0)
Basophils Absolute: 0 10*3/uL (ref 0.0–0.1)
EOS PCT: 4.5 % (ref 0.0–5.0)
Eosinophils Absolute: 0.3 10*3/uL (ref 0.0–0.7)
HEMATOCRIT: 42.5 % (ref 39.0–52.0)
HEMOGLOBIN: 14.4 g/dL (ref 13.0–17.0)
LYMPHS ABS: 2 10*3/uL (ref 0.7–4.0)
Lymphocytes Relative: 30.5 % (ref 12.0–46.0)
MCHC: 33.8 g/dL (ref 30.0–36.0)
MCV: 89 fl (ref 78.0–100.0)
MONOS PCT: 11.3 % (ref 3.0–12.0)
Monocytes Absolute: 0.7 10*3/uL (ref 0.1–1.0)
NEUTROS ABS: 3.4 10*3/uL (ref 1.4–7.7)
Neutrophils Relative %: 53.1 % (ref 43.0–77.0)
Platelets: 173 10*3/uL (ref 150.0–400.0)
RBC: 4.78 Mil/uL (ref 4.22–5.81)
RDW: 13.9 % (ref 11.5–15.5)
WBC: 6.4 10*3/uL (ref 4.0–10.5)

## 2013-09-23 LAB — TSH: TSH: 3.53 u[IU]/mL (ref 0.35–4.50)

## 2013-09-23 LAB — LIPID PANEL
CHOLESTEROL: 215 mg/dL — AB (ref 0–200)
HDL: 34.5 mg/dL — ABNORMAL LOW (ref >39.00)
LDL CALC: 144 mg/dL — AB (ref 0–99)
TRIGLYCERIDES: 182 mg/dL — AB (ref 0.0–149.0)
Total CHOL/HDL Ratio: 6
VLDL: 36.4 mg/dL (ref 0.0–40.0)

## 2013-09-23 LAB — HEPATIC FUNCTION PANEL
ALT: 26 U/L (ref 0–53)
AST: 22 U/L (ref 0–37)
Albumin: 4.3 g/dL (ref 3.5–5.2)
Alkaline Phosphatase: 65 U/L (ref 39–117)
BILIRUBIN TOTAL: 0.5 mg/dL (ref 0.2–1.2)
Bilirubin, Direct: 0.1 mg/dL (ref 0.0–0.3)
Total Protein: 6.8 g/dL (ref 6.0–8.3)

## 2013-09-23 LAB — BASIC METABOLIC PANEL
BUN: 10 mg/dL (ref 6–23)
CHLORIDE: 96 meq/L (ref 96–112)
CO2: 28 meq/L (ref 19–32)
CREATININE: 0.7 mg/dL (ref 0.4–1.5)
Calcium: 9.5 mg/dL (ref 8.4–10.5)
GFR: 126.14 mL/min (ref >60.00)
Glucose, Bld: 95 mg/dL (ref 70–99)
POTASSIUM: 4.1 meq/L (ref 3.5–5.1)
Sodium: 133 mEq/L — ABNORMAL LOW (ref 135–145)

## 2013-09-23 LAB — MICROALBUMIN / CREATININE URINE RATIO
Creatinine,U: 39.9 mg/dL
MICROALB/CREAT RATIO: 1 mg/g (ref 0.0–30.0)
Microalb, Ur: 0.4 mg/dL (ref 0.0–1.9)

## 2013-09-23 LAB — PSA, MEDICARE: PSA: 0.25 ng/mL (ref 0.10–4.00)

## 2013-09-23 LAB — HEMOGLOBIN A1C: HEMOGLOBIN A1C: 5.8 % (ref 4.6–6.5)

## 2013-09-23 MED ORDER — FLUOCINONIDE 0.05 % EX CREA: 1.0000 "application " | TOPICAL_CREAM | Freq: Four times a day (QID) | CUTANEOUS | Status: DC

## 2013-09-23 MED ORDER — AZITHROMYCIN 500 MG PO TABS: 500.0000 mg | ORAL_TABLET | Freq: Every day | ORAL | Status: DC

## 2013-09-23 NOTE — Patient Instructions (Addendum)
blood tests, and a chest-x-ray, are being requested for you today.  We'll contact you with results. Please call if the bruise gets much bigger.  i have sent 2 prescriptions to your pharmacy: antibiotic and skin cream.   good diet and exercise habits significanly improve the control of your diabetes.  please let me know if you wish to be referred to a dietician.  high blood sugar is very risky to your health.  you should see an eye doctor every year.  You are at higher than average risk for pneumonia and hepatitis-B.  You should be vaccinated against both.   You should have a vaccine against shingles (a painful rash which results from the  chickenpox infection which most people had many years ago).  This vaccine reduces, but does not totally eliminate the risk of shingles.  Because this is a medicare part d benefit, you should get it at a pharmacy.   please consider these measures for your health:  minimize alcohol.  do not use tobacco products.  have a colonoscopy at least every 10 years from age 37.  keep firearms safely stored.  always use seat belts.  have working smoke alarms in your home.  see an eye doctor and dentist regularly.  never drive under the influence of alcohol or drugs (including prescription drugs).  those with fair skin should take precautions against the sun. it is critically important to prevent falling down (keep floor areas well-lit, dry, and free of loose objects.  If you have a cane, walker, or wheelchair, you should use it, even for short trips around the house.  Also, try not to rush).

## 2013-09-23 NOTE — Progress Notes (Signed)
Subjective:    Patient ID: Ronnie Snyder, male    DOB: 1941-03-13, 73 y.o.   MRN: [id number]  HPI Pt states 10 days of moderate prod-quality cough in the chest, and assoc nasal congestion and wheezing.   Past Medical History  Diagnosis Date  . Hx of colonic polyps   . GERD (gastroesophageal reflux disease)   . HTN (hypertension)   . Hepatic steatosis   . Abdominal aortic aneurysm     3 cm aneurysm followed with serial ultrasound  . Hypothyroidism   . PAD (peripheral artery disease)   . High cholesterol     "at one time; it's fine now" (10/13/2012)  . H/O hiatal hernia   . Arthritis     "in my feet" (10/13/2012)  . Skin cancer     "burned them off my arm and such" (10/13/2012)    Past Surgical History  Procedure Laterality Date  . Esophagogastroduodenoscopy    . Gated spect wall motion stress cardiolite  02/10/2002  . Nose surgery    . Hip fracture surgery Right 1990's  . Femoral endarterectomy Left 01/23/12    Left Endarterectomy  with bovie patch Angioplasy  . Inguinal hernia repair Right     "years ago" (10/13/2012)  . Tonsillectomy      "I was a chld" (10/13/2012)  . Angioplasty / stenting femoral Left 10/13/2012    History   Social History  . Marital Status: Married    Spouse Name: N/A    Number of Children: N/A  . Years of Education: N/A   Occupational History  . retired     Therapist, music   Social History Main Topics  . Smoking status: Current Every Day Smoker -- 1.00 packs/day for 57 years    Types: Cigarettes  . Smokeless tobacco: Never Used     Comment: pt states there is no hope for quitting  . Alcohol Use: 3.6 oz/week    6 Cans of beer per week     Comment: 10/13/2012 "drink a 6 pack/wk"  . Drug Use: No  . Sexual Activity: No   Other Topics Concern  . Not on file   Social History Narrative  . No narrative on file    Current Outpatient Prescriptions on File Prior to Visit  Medication Sig Dispense Refill  . acetaminophen (TYLENOL) 500 MG  tablet Take 250 mg by mouth every 6 (six) hours as needed for pain.      Marland Kitchen amLODipine (NORVASC) 5 MG tablet TAKE 1 TABLET ONE TIME DAILY  90 tablet  0  . aspirin 81 MG tablet Take 81 mg by mouth every other day.       . Cholecalciferol (VITAMIN D-3 PO) Take 1 tablet by mouth 2 (two) times a week.       . clopidogrel (PLAVIX) 75 MG tablet Take 1 tablet (75 mg total) by mouth daily with breakfast.  30 tablet  6  . lansoprazole (PREVACID) 30 MG capsule Take 30 mg by mouth every other day.      . levothyroxine (SYNTHROID, LEVOTHROID) 50 MCG tablet TAKE 1 TABLET ONE TIME DAILY  90 tablet  0  . lisinopril-hydrochlorothiazide (PRINZIDE,ZESTORETIC) 20-12.5 MG per tablet Take 1 tablet by mouth daily.  90 tablet  3  . trolamine salicylate (ASPERCREME) 10 % cream Apply 1 application topically daily as needed (for muscle pain).       No current facility-administered medications on file prior to visit.    Allergies  Allergen Reactions  .  Vicodin [Hydrocodone-Acetaminophen] Nausea Only  . Atorvastatin Nausea Only    REACTION: sick  . Other     ALL PAINS/NARCOTIC MEDS-CANNOT TOLERATE WELL  . Pioglitazone Nausea Only  . Penicillins Rash    Family History  Problem Relation Age of Onset  . Hypertension Mother   . Hypertension Father   . Hypertension Sister   . Hypertension Sister     BP 130/70  Pulse 68  Temp(Src) 98.4 F (36.9 C) (Oral)  Ht 5\' 9"  (1.753 m)  Wt 163 lb (73.936 kg)  BMI 24.06 kg/m2  SpO2 96%  Review of Systems He has a bruise at the right flank (does not recall injury). He also has a diffuse patchy rash.  Denies fever and sob.      Objective:   Physical Exam VITAL SIGNS:  See vs page GENERAL: no distress LUNGS:  Clear to auscultation Right flank: 6 cm diameter ecchymosis Skin: mild diffuse polymacular rash (total approx 10 macules).     Lab Results  Component Value Date   WBC 6.4 09/23/2013   HGB 14.4 09/23/2013   HCT 42.5 09/23/2013   PLT 173.0 09/23/2013   GLUCOSE 95  09/23/2013   CHOL 215* 09/23/2013   TRIG 182.0* 09/23/2013   HDL 34.50* 09/23/2013   LDLDIRECT 183.8 08/13/2012   LDLCALC 144* 09/23/2013   ALT 26 09/23/2013   AST 22 09/23/2013   NA 133* 09/23/2013   K 4.1 09/23/2013   CL 96 09/23/2013   CREATININE 0.7 09/23/2013   BUN 10 09/23/2013   CO2 28 09/23/2013   TSH 3.53 09/23/2013   PSA 0.25 09/23/2013   INR 0.97 01/15/2012   HGBA1C 5.8 09/23/2013   MICROALBUR 0.4 09/23/2013      Assessment & Plan:  Acute bronchitis: new: pt declines MDI. Ecchymosis: new, uncertain etiology.   Dyslipidemia: therapy is limited by perceived drug intolerance: ecg is declined    Subjective:   Patient here for Medicare annual wellness visit and management of other chronic and acute problems.     Risk factors: advanced age    43 of Physicians Providing Medical Care to Patient:  See "snapshot"   Activities of Daily Living: In your present state of health, do you have any difficulty performing the following activities?:  Preparing food and eating?: No  Bathing yourself: No  Getting dressed: No  Using the toilet:No  Moving around from place to place: No  In the past year have you fallen or had a near fall?:No    Home Safety: Has smoke detector and wears seat belts. No firearms. No excess sun exposure.  Diet and Exercise  Current exercise habits: pt says limited by health problems, but still pretty good.  Dietary issues discussed: pt reports a healthy diet   Depression Screen  Q1: Over the past two weeks, have you felt down, depressed or hopeless?no  Q2: Over the past two weeks, have you felt little interest or pleasure in doing things? no   The following portions of the patient's history were reviewed and updated as appropriate: allergies, current medications, past family history, past medical history, past social history, past surgical history and problem list.  Past Medical History  Diagnosis Date  . Hx of colonic polyps   . GERD (gastroesophageal reflux disease)   .  HTN (hypertension)   . Hepatic steatosis   . Abdominal aortic aneurysm     3 cm aneurysm followed with serial ultrasound  . Hypothyroidism   . PAD (peripheral artery disease)   .  High cholesterol     "at one time; it's fine now" (10/13/2012)  . H/O hiatal hernia   . Arthritis     "in my feet" (10/13/2012)  . Skin cancer     "burned them off my arm and such" (10/13/2012)    Past Surgical History  Procedure Laterality Date  . Esophagogastroduodenoscopy    . Gated spect wall motion stress cardiolite  02/10/2002  . Nose surgery    . Hip fracture surgery Right 1990's  . Femoral endarterectomy Left 01/23/12    Left Endarterectomy  with bovie patch Angioplasy  . Inguinal hernia repair Right     "years ago" (10/13/2012)  . Tonsillectomy      "I was a chld" (10/13/2012)  . Angioplasty / stenting femoral Left 10/13/2012    History   Social History  . Marital Status: Married    Spouse Name: N/A    Number of Children: N/A  . Years of Education: N/A   Occupational History  . retired     Therapist, music   Social History Main Topics  . Smoking status: Current Every Day Smoker -- 1.00 packs/day for 57 years    Types: Cigarettes  . Smokeless tobacco: Never Used     Comment: pt states there is no hope for quitting  . Alcohol Use: 3.6 oz/week    6 Cans of beer per week     Comment: 10/13/2012 "drink a 6 pack/wk"  . Drug Use: No  . Sexual Activity: No   Other Topics Concern  . Not on file   Social History Narrative  . No narrative on file    Current Outpatient Prescriptions on File Prior to Visit  Medication Sig Dispense Refill  . acetaminophen (TYLENOL) 500 MG tablet Take 250 mg by mouth every 6 (six) hours as needed for pain.      Marland Kitchen amLODipine (NORVASC) 5 MG tablet TAKE 1 TABLET ONE TIME DAILY  90 tablet  0  . aspirin 81 MG tablet Take 81 mg by mouth every other day.       . Cholecalciferol (VITAMIN D-3 PO) Take 1 tablet by mouth 2 (two) times a week.       . clopidogrel (PLAVIX) 75 MG  tablet Take 1 tablet (75 mg total) by mouth daily with breakfast.  30 tablet  6  . lansoprazole (PREVACID) 30 MG capsule Take 30 mg by mouth every other day.      . levothyroxine (SYNTHROID, LEVOTHROID) 50 MCG tablet TAKE 1 TABLET ONE TIME DAILY  90 tablet  0  . lisinopril-hydrochlorothiazide (PRINZIDE,ZESTORETIC) 20-12.5 MG per tablet Take 1 tablet by mouth daily.  90 tablet  3  . trolamine salicylate (ASPERCREME) 10 % cream Apply 1 application topically daily as needed (for muscle pain).       No current facility-administered medications on file prior to visit.    Allergies  Allergen Reactions  . Vicodin [Hydrocodone-Acetaminophen] Nausea Only  . Atorvastatin Nausea Only    REACTION: sick  . Other     ALL PAINS/NARCOTIC MEDS-CANNOT TOLERATE WELL  . Pioglitazone Nausea Only  . Penicillins Rash    Family History  Problem Relation Age of Onset  . Hypertension Mother   . Hypertension Father   . Hypertension Sister   . Hypertension Sister     BP 130/70  Pulse 68  Temp(Src) 98.4 F (36.9 C) (Oral)  Ht 5\' 9"  (1.753 m)  Wt 163 lb (73.936 kg)  BMI 24.06 kg/m2  SpO2 96%  Review of Systems  Denies hearing loss, and visual loss Objective:   Vision:  Sees opthalmologist Hearing: grossly normal Body mass index:  See vs page Msk: pt easily and quickly performs "get-up-and-go" from a sitting position Cognitive Impairment Assessment: cognition, memory and judgment appear normal.  remembers 3/3 at 5 minutes.  excellent recall.  can easily read and write a sentence.  alert and oriented x 3   Assessment:   Medicare wellness utd on preventive parameters    Plan:   During the course of the visit the patient was educated and counseled about appropriate screening and preventive services including:        Fall prevention    Diabetes screening  Nutrition counseling  Colonoscopy is refused  Vaccines / LABS Zostavax / Pneumococcal Vaccine  today  PSA  Patient Instructions (the  written plan) was given to the patient.   we discussed code status.  pt requests full code, but would not want to be started or maintained on artificial life-support measures if there was not a reasonable chance of recovery

## 2013-10-05 ENCOUNTER — Telehealth: Payer: Self-pay | Admitting: *Deleted

## 2013-10-05 MED ORDER — PROMETHAZINE-CODEINE 6.25-10 MG/5ML PO SYRP: 5.0000 mL | ORAL_SOLUTION | ORAL | Status: DC | PRN

## 2013-10-05 MED ORDER — LOSARTAN POTASSIUM-HCTZ 50-12.5 MG PO TABS: 1.0000 | ORAL_TABLET | Freq: Every day | ORAL | Status: DC

## 2013-10-05 NOTE — Telephone Encounter (Signed)
Called pt. He states that he would like to try a cough medication as well as changing the Lisinopril/HCTZ. Please advise, Thanks!

## 2013-10-05 NOTE — Telephone Encounter (Signed)
Please verify no fever or sod If not, please offer pt inhaler and/or cough medication. Also, changing lisinopril/HCTZ to an alternative might help Please let me know

## 2013-10-05 NOTE — Telephone Encounter (Signed)
i have sent a prescription to your pharmacy, to change the BP med i printed rx for cough syrup.

## 2013-10-05 NOTE — Telephone Encounter (Signed)
Rx faxed and pt aware. 

## 2013-10-05 NOTE — Telephone Encounter (Signed)
Per MyChart message:  I have taken all the Azithromycin 500mg . I still have a nagging cough all day&nite. Could the doctor advise me on what I can take to help get this out of my throat. I take a 12hr Mucinex, once a day. Does not seem to help at nite to stop the hacking. Can he help me? Tks.

## 2013-10-25 ENCOUNTER — Other Ambulatory Visit: Payer: Self-pay | Admitting: Endocrinology

## 2013-11-19 ENCOUNTER — Other Ambulatory Visit: Payer: Self-pay | Admitting: Surgery

## 2013-12-15 DIAGNOSIS — L570 Actinic keratosis: Secondary | ICD-10-CM | POA: Diagnosis not present

## 2013-12-15 DIAGNOSIS — L219 Seborrheic dermatitis, unspecified: Secondary | ICD-10-CM | POA: Diagnosis not present

## 2013-12-22 ENCOUNTER — Other Ambulatory Visit: Payer: Self-pay | Admitting: Endocrinology

## 2013-12-23 ENCOUNTER — Other Ambulatory Visit: Payer: Self-pay | Admitting: *Deleted

## 2013-12-23 ENCOUNTER — Ambulatory Visit (INDEPENDENT_AMBULATORY_CARE_PROVIDER_SITE_OTHER): Payer: Medicare Other | Admitting: Podiatry

## 2013-12-23 ENCOUNTER — Encounter: Payer: Self-pay | Admitting: Podiatry

## 2013-12-23 VITALS — BP 120/70 | HR 73 | Resp 16

## 2013-12-23 DIAGNOSIS — M7750 Other enthesopathy of unspecified foot: Secondary | ICD-10-CM

## 2013-12-23 MED ORDER — LEVOTHYROXINE SODIUM 50 MCG PO TABS: 50.0000 ug | ORAL_TABLET | Freq: Every day | ORAL | Status: DC

## 2013-12-23 MED ORDER — AMLODIPINE BESYLATE 5 MG PO TABS: 5.0000 mg | ORAL_TABLET | Freq: Every day | ORAL | Status: DC

## 2013-12-23 NOTE — Progress Notes (Signed)
He presents today with a chief complaint of painful ankle joints bilateral.  Objective: Vital signs are stable he is alert and oriented x3. He has pain on palpation and range of motion of the ankle joint anteriorly bilateral.  Assessment: Ankle joint capsulitis bilateral.  Plan: Injected Kenalog into the ankle joint bilateral after sterile Betadine skin prep. Followup with him as needed.

## 2013-12-28 ENCOUNTER — Ambulatory Visit: Payer: Medicare Other | Admitting: Podiatry

## 2014-01-11 ENCOUNTER — Ambulatory Visit: Payer: Medicare Other | Admitting: Endocrinology

## 2014-01-25 ENCOUNTER — Ambulatory Visit (INDEPENDENT_AMBULATORY_CARE_PROVIDER_SITE_OTHER): Payer: Medicare Other | Admitting: Endocrinology

## 2014-01-25 ENCOUNTER — Encounter: Payer: Self-pay | Admitting: Endocrinology

## 2014-01-25 VITALS — BP 126/76 | HR 67 | Temp 98.2°F | Ht 69.0 in | Wt 158.0 lb

## 2014-01-25 DIAGNOSIS — E7800 Pure hypercholesterolemia, unspecified: Secondary | ICD-10-CM | POA: Diagnosis not present

## 2014-01-25 NOTE — Progress Notes (Signed)
Subjective:    Patient ID: Ronnie Snyder, male    DOB: Oct 10, 1940, 73 y.o.   MRN: [id number]  HPI Pt states few mos of moderately decreased urinary stream at the urethra, but no assoc dysuria.  He feels sxs are coming from the hyzaar. He takes both zestoretic and hyzaar. Past Medical History  Diagnosis Date  . Hx of colonic polyps   . GERD (gastroesophageal reflux disease)   . HTN (hypertension)   . Hepatic steatosis   . Abdominal aortic aneurysm     3 cm aneurysm followed with serial ultrasound  . Hypothyroidism   . PAD (peripheral artery disease)   . High cholesterol     "at one time; it's fine now" (10/13/2012)  . H/O hiatal hernia   . Arthritis     "in my feet" (10/13/2012)  . Skin cancer     "burned them off my arm and such" (10/13/2012)    Past Surgical History  Procedure Laterality Date  . Esophagogastroduodenoscopy    . Gated spect wall motion stress cardiolite  02/10/2002  . Nose surgery    . Hip fracture surgery Right 1990's  . Femoral endarterectomy Left 01/23/12    Left Endarterectomy  with bovie patch Angioplasy  . Inguinal hernia repair Right     "years ago" (10/13/2012)  . Tonsillectomy      "I was a chld" (10/13/2012)  . Angioplasty / stenting femoral Left 10/13/2012    History   Social History  . Marital Status: Married    Spouse Name: N/A    Number of Children: N/A  . Years of Education: N/A   Occupational History  . retired     Therapist, music   Social History Main Topics  . Smoking status: Current Every Day Smoker -- 1.00 packs/day for 57 years    Types: Cigarettes  . Smokeless tobacco: Never Used     Comment: pt states there is no hope for quitting  . Alcohol Use: 3.6 oz/week    6 Cans of beer per week     Comment: 10/13/2012 "drink a 6 pack/wk"  . Drug Use: No  . Sexual Activity: No   Other Topics Concern  . Not on file   Social History Narrative  . No narrative on file    Current Outpatient Prescriptions on File Prior to Visit    Medication Sig Dispense Refill  . amLODipine (NORVASC) 5 MG tablet Take 1 tablet (5 mg total) by mouth daily.  90 tablet  0  . Cholecalciferol (VITAMIN D-3 PO) Take 1 tablet by mouth 2 (two) times a week.       . clobetasol (TEMOVATE) 0.05 % external solution       . clopidogrel (PLAVIX) 75 MG tablet take 1 tablet by mouth once daily  30 tablet  6  . lansoprazole (PREVACID) 30 MG capsule Take 30 mg by mouth every other day.      . levothyroxine (SYNTHROID, LEVOTHROID) 50 MCG tablet Take 1 tablet (50 mcg total) by mouth daily before breakfast.  90 tablet  0  . losartan-hydrochlorothiazide (HYZAAR) 50-12.5 MG per tablet Take 1 tablet by mouth daily.  30 tablet  11  . trolamine salicylate (ASPERCREME) 10 % cream Apply 1 application topically daily as needed (for muscle pain).      Marland Kitchen acetaminophen (TYLENOL) 500 MG tablet Take 250 mg by mouth every 6 (six) hours as needed for pain.      Marland Kitchen aspirin 81 MG tablet Take 81  mg by mouth every other day.       . fluocinonide cream (LIDEX) 7.82 % Apply 1 application topically 4 (four) times daily. As needed for rash  30 g  1  . promethazine-codeine (PHENERGAN WITH CODEINE) 6.25-10 MG/5ML syrup Take 5 mLs by mouth every 4 (four) hours as needed for cough.  240 mL  0   No current facility-administered medications on file prior to visit.    Allergies  Allergen Reactions  . Vicodin [Hydrocodone-Acetaminophen] Nausea Only  . Atorvastatin Nausea Only    REACTION: sick  . Other     ALL PAINS/NARCOTIC MEDS-CANNOT TOLERATE WELL  . Pioglitazone Nausea Only  . Penicillins Rash    Family History  Problem Relation Age of Onset  . Hypertension Mother   . Hypertension Father   . Hypertension Sister   . Hypertension Sister     BP 126/76  Pulse 67  Temp(Src) 98.2 F (36.8 C) (Oral)  Ht 5\' 9"  (1.753 m)  Wt 158 lb (71.668 kg)  BMI 23.32 kg/m2  SpO2 93%   Review of Systems Cough is much less now.  Denies hematuria.      Objective:   Physical  Exam VITAL SIGNS:  See vs page GENERAL: no distress (rectal is refused) Ext: no edema.      Assessment & Plan:  Prostatism, new Cough, exac by acei, improved HTN: well-controlled.   Patient is advised the following: Patient Instructions  Please stop taking the lisinopril/HCTZ--take just the losartan/HCTZ.   If the decreased urinary stream continues, please let me know, so i can prescribe another pill for this.

## 2014-01-25 NOTE — Patient Instructions (Addendum)
Please stop taking the lisinopril/HCTZ--take just the losartan/HCTZ.   If the decreased urinary stream continues, please let me know, so i can prescribe another pill for this.

## 2014-02-09 ENCOUNTER — Encounter: Payer: Self-pay | Admitting: Vascular Surgery

## 2014-02-09 ENCOUNTER — Other Ambulatory Visit: Payer: Self-pay | Admitting: Endocrinology

## 2014-02-09 ENCOUNTER — Encounter: Payer: Self-pay | Admitting: Endocrinology

## 2014-02-09 DIAGNOSIS — M79662 Pain in left lower leg: Secondary | ICD-10-CM

## 2014-02-10 ENCOUNTER — Ambulatory Visit (INDEPENDENT_AMBULATORY_CARE_PROVIDER_SITE_OTHER)
Admission: RE | Admit: 2014-02-10 | Discharge: 2014-02-10 | Disposition: A | Discharge status: Home / Self Care | Payer: Medicare Other | Source: Ambulatory Visit | Attending: Family | Admitting: Family

## 2014-02-10 ENCOUNTER — Encounter (HOSPITAL_COMMUNITY): Payer: Medicare Other

## 2014-02-10 ENCOUNTER — Ambulatory Visit (INDEPENDENT_AMBULATORY_CARE_PROVIDER_SITE_OTHER): Payer: Medicare Other | Admitting: Family

## 2014-02-10 ENCOUNTER — Ambulatory Visit: Payer: Medicare Other | Admitting: Family

## 2014-02-10 ENCOUNTER — Encounter: Payer: Self-pay | Admitting: Family

## 2014-02-10 ENCOUNTER — Ambulatory Visit (HOSPITAL_COMMUNITY)
Admission: RE | Admit: 2014-02-10 | Discharge: 2014-02-10 | Disposition: A | Discharge status: Home / Self Care | Payer: Medicare Other | Source: Ambulatory Visit | Attending: Family | Admitting: Family

## 2014-02-10 ENCOUNTER — Other Ambulatory Visit: Payer: Self-pay | Admitting: Family

## 2014-02-10 ENCOUNTER — Other Ambulatory Visit: Payer: Self-pay | Admitting: Endocrinology

## 2014-02-10 ENCOUNTER — Other Ambulatory Visit (HOSPITAL_COMMUNITY): Payer: Medicare Other

## 2014-02-10 VITALS — BP 157/83 | HR 68 | Resp 16 | Ht 69.0 in | Wt 158.0 lb

## 2014-02-10 DIAGNOSIS — I739 Peripheral vascular disease, unspecified: Secondary | ICD-10-CM

## 2014-02-10 DIAGNOSIS — Z48812 Encounter for surgical aftercare following surgery on the circulatory system: Secondary | ICD-10-CM

## 2014-02-10 DIAGNOSIS — I6529 Occlusion and stenosis of unspecified carotid artery: Secondary | ICD-10-CM

## 2014-02-10 DIAGNOSIS — I10 Essential (primary) hypertension: Secondary | ICD-10-CM | POA: Diagnosis not present

## 2014-02-10 DIAGNOSIS — I70219 Atherosclerosis of native arteries of extremities with intermittent claudication, unspecified extremity: Secondary | ICD-10-CM

## 2014-02-10 MED ORDER — TAMSULOSIN HCL 0.4 MG PO CAPS: 0.4000 mg | ORAL_CAPSULE | Freq: Every day | ORAL | Status: DC

## 2014-02-10 NOTE — Addendum Note (Signed)
Addended by: Mena Goes on: 02/10/2014 03:38 PM   Modules accepted: Orders

## 2014-02-10 NOTE — Progress Notes (Signed)
[facility] [place] HISTORY AND PHYSICAL -PAD  History of Present Illness Ronnie Snyder is a 73 y.o. male status post left iliofemoral endarterectomy with bovine pericardial patch angioplasty on 01/23/2012. This was done by Dr. Scot Dock since Dr. Trula Slade was called away to an emergency. This was done in the setting of left lower extremity claudication.  On patient's visit in January of this year treatment options were discussed with the patient. Dr. Trula Slade felt these included continued medical management with the addition of Trental. He could also undergo repeat angiography and potential stent placement, versus surgical bypass. After a well informed discussion patient and Dr. Trula Slade decided to try Trental. Patient did have a good response to Pletal but could not tolerate it. Hopefully he will be able to tolerate his new medication. Dr. Trula Slade also talked to patient about participating in [place]. He was receptive. Patient was scheduled to come back in 6 months for followup with the advice that If his symptoms deteriorated between then and the scheduled follow up, that angiography would be done.  On 10/10/2012 patient underwent an aortogram by Dr. Trula Slade and the following was done:  1. ultrasound-guided access, right femoral artery  2. abdominal aortogram  3. left lower extremity runoff  4. atherectomy, left superficial femoral and popliteal artery  5. stent, left superficial femoral and popliteal  Patient complains of arthritis in feet and ankles and gets cortisone injections both ankles about every 6 months.  Patient feels he got some left calf relief about 3 weeks after the stent placement, then left calf, shin, ankle pain returned with walking.  Patient states that both Trental and Pletal caused him to feel "swimmy headed" and is taking oxycodone, 1/2 tablet at HS or Tylenol or Advil for pain releif.  Patient denies any claudication or other pain in the right leg.  He  walks about 1/2 mile and has left calf steady pain, relieved by rest or heat.  Has aching in lower leg at night, somewhat relieved by heat.  Patient denies history of DVT or PE.  Patient denies non healing ulcers on lower extremities.  Patient denies New Medical or Surgical History:  He denies history of stroke or TIA.  His PCP gave him a referral to go to pain management, pt states he may have left sciatic nerve issues.  He intermitently hears his heartbeat in his ears in the last few months  Pt Diabetic: No  Pt smoker: smoker (1/2 ppd x 60 yrs)  Pt meds include:  Statin :No, tried 2 or 3 meds and caused stomach pain  Betablocker: No  ASA:  81 mg daily  Other anticoagulants/antiplatelets: Plavix   Past Medical History  Diagnosis Date  . Hx of colonic polyps   . GERD (gastroesophageal reflux disease)   . HTN (hypertension)   . Hepatic steatosis   . Abdominal aortic aneurysm     3 cm aneurysm followed with serial ultrasound  . Hypothyroidism   . PAD (peripheral artery disease)   . High cholesterol     "at one time; it's fine now" (10/13/2012)  . H/O hiatal hernia   . Arthritis     "in my feet" (10/13/2012)  . Skin cancer     "burned them off my arm and such" (10/13/2012)    Social History History  Substance Use Topics  . Smoking status: Light Tobacco Smoker -- 1.00 packs/day for 57 years    Types: Cigarettes  . Smokeless tobacco: Never Used  Comment: pt states there is no hope for quitting  . Alcohol Use: 3.6 oz/week    6 Cans of beer per week     Comment: 10/13/2012 "drink a 6 pack/wk"    Family History Family History  Problem Relation Age of Onset  . Hypertension Mother   . Hypertension Father   . Hypertension Sister   . Hypertension Sister     Past Surgical History  Procedure Laterality Date  . Esophagogastroduodenoscopy    . Gated spect wall motion stress cardiolite  02/10/2002  . Nose surgery    . Hip fracture surgery Right 1990's  . Femoral  endarterectomy Left 01/23/12    Left Endarterectomy  with bovie patch Angioplasy  . Inguinal hernia repair Right     "years ago" (10/13/2012)  . Tonsillectomy      "I was a chld" (10/13/2012)  . Angioplasty / stenting femoral Left 10/13/2012    Allergies  Allergen Reactions  . Vicodin [Hydrocodone-Acetaminophen] Nausea Only  . Atorvastatin Nausea Only    REACTION: sick  . Other     ALL PAINS/NARCOTIC MEDS-CANNOT TOLERATE WELL  . Pioglitazone Nausea Only  . Penicillins Rash    Current Outpatient Prescriptions  Medication Sig Dispense Refill  . acetaminophen (TYLENOL) 500 MG tablet Take 250 mg by mouth every 6 (six) hours as needed for pain.      Marland Kitchen amLODipine (NORVASC) 5 MG tablet Take 1 tablet (5 mg total) by mouth daily.  90 tablet  0  . aspirin 81 MG tablet Take 81 mg by mouth every other day.       . Cholecalciferol (VITAMIN D-3 PO) Take 1 tablet by mouth 2 (two) times a week.       . clobetasol (TEMOVATE) 0.05 % external solution       . clopidogrel (PLAVIX) 75 MG tablet take 1 tablet by mouth once daily  30 tablet  6  . fluocinonide cream (LIDEX) 2.40 % Apply 1 application topically 4 (four) times daily. As needed for rash  30 g  1  . lansoprazole (PREVACID) 30 MG capsule Take 30 mg by mouth every other day.      . levothyroxine (SYNTHROID, LEVOTHROID) 50 MCG tablet Take 1 tablet (50 mcg total) by mouth daily before breakfast.  90 tablet  0  . losartan-hydrochlorothiazide (HYZAAR) 50-12.5 MG per tablet Take 1 tablet by mouth daily.  30 tablet  11  . promethazine-codeine (PHENERGAN WITH CODEINE) 6.25-10 MG/5ML syrup Take 5 mLs by mouth every 4 (four) hours as needed for cough.  240 mL  0  . trolamine salicylate (ASPERCREME) 10 % cream Apply 1 application topically daily as needed (for muscle pain).       No current facility-administered medications for this visit.    ROS: See HPI for pertinent positives and negatives.   Physical Examination  Filed Vitals:   02/10/14 1005   BP: 157/83  Pulse: 68  Resp: 16  Height: 5\' 9"  (1.753 m)  Weight: 158 lb (71.668 kg)  SpO2: 97%   Body mass index is 23.32 kg/(m^2).  General: A&O x 3, WDWN.  Gait: normal  Eyes: PERRLA.  Pulmonary: CTAB, without wheezes , rales or rhonchi.  Cardiac: regular Rythm , without detected murmur.   Carotid Bruits  Left  Right    Negative  Negative    Aorta is not palpable.  Radial pulses: 2+ and =   VASCULAR EXAM:  Extremities without ischemic changes  without Gangrene; without open wounds.  LE Pulses  LEFT  RIGHT   FEMORAL  2+palpable  2+palpable   POPLITEAL  not palpable  not palpable   POSTERIOR TIBIAL  2+ palpable  1+ palpable   DORSALIS PEDIS  ANTERIOR TIBIAL  2+ palpable  2+palpable   Abdomen: soft, NT, no masses.  Skin: no rashes, no ulcers noted.  Musculoskeletal: no muscle wasting or atrophy.  Neurologic: A&O X 3; Appropriate Affect ; SENSATION: normal; MOTOR FUNCTION: moving all extremities equally, motor strength 5/5 throughout. Speech is fluent/normal. CN 2-12 intact.   Non-Invasive Vascular Imaging: DATE: 02/10/2014 LOWER EXTREMITY ARTERIAL EVALUATION    INDICATION: Peripheral vascular disease     PREVIOUS INTERVENTION(S): Left iliofemoral endarterectomy 01/23/2012; Left femoropopliteal atherectomy with stent 10/13/2012    DUPLEX EXAM:     RIGHT  LEFT   Peak Systolic Velocity (cm/s) Ratio (if abnormal) Waveform  Peak Systolic Velocity (cm/s) Ratio (if abnormal) Waveform     Artery - Proximal to Stent 108  T     Stent - Origin 68  T     Stent - Proximal 124  T     Stent - Mid 87  B     Stent - Distal 119  B      Stent - End 55  B     Artery - Distal to Stent 81/73  B/B  0.73/0.46 Today's ABI / TBI 1.02/0.72  0.68/0.46 Previous ABI / TBI (08/09/2013  ) 0.98/0.57    Waveform:    M - Monophasic       B - Biphasic       T - Triphasic  If Ankle Brachial Index (ABI) or Toe Brachial Index (TBI) performed, please see complete report     ADDITIONAL  FINDINGS:     IMPRESSION: Patent left superficial femoral to popliteal artery stent is patent, no hemodynamically significant stenosis is present.    Compared to the previous exam:  Stable ankle brachial indices and improved left toe brachial index since previous study on 08/09/2013.    ASSESSMENT: Ronnie Snyder is a 73 y.o. male who is s/p left iliofemoral endarterectomy 01/23/2012, left femoropopliteal atherectomy with stent 10/13/2012. He has has pain in his left lower leg with walking but his left femoral stent is patent and left ABI is normal; therefore his left calf pain is not due to arterial insufficiency. His right ABI remains stable indicating evidence of moderate arterial occlusive disease, but he is asymptomatic in his right leg. Unfortunately he continues to smoke, but has decreased use to 1/2 ppd; he was again counseled re smoking cessation.  PLAN:  I discussed in depth with the patient the nature of atherosclerosis, and emphasized the importance of maximal medical management including strict control of blood pressure, blood glucose, and lipid levels, obtaining regular exercise, and cessation of smoking.  The patient is aware that without maximal medical management the underlying atherosclerotic disease process will progress, limiting the benefit of any interventions.  Based on the patient's vascular studies and examination, pt will return to clinic in 1 year for ABI's, left LE arterial Duplex, carotid Duplex (none on file, pt started hearing his heartbeat in his ears intermittently).  The patient was given information about PAD including signs, symptoms, treatment, what symptoms should prompt the patient to seek immediate medical care, and risk reduction measures to take.  Clemon Chambers, RN, MSN, FNP-C [facility] Phone: [phone]  Clinic MD: [facility]  02/10/2014 10:21 AM

## 2014-02-10 NOTE — Patient Instructions (Signed)
Peripheral Vascular Disease Peripheral Vascular Disease (PVD), also called Peripheral Arterial Disease (PAD), is a circulation problem caused by cholesterol (atherosclerotic plaque) deposits in the arteries. PVD commonly occurs in the lower extremities (legs) but it can occur in other areas of the body, such as your arms. The cholesterol buildup in the arteries reduces blood flow which can cause pain and other serious problems. The presence of PVD can place a person at risk for Coronary Artery Disease (CAD).  CAUSES  Causes of PVD can be many. It is usually associated with more than one risk factor such as:   High Cholesterol.  Smoking.  Diabetes.  Lack of exercise or inactivity.  High blood pressure (hypertension).  Obesity.  Family history. SYMPTOMS   When the lower extremities are affected, patients with PVD may experience:  Leg pain with exertion or physical activity. This is called INTERMITTENT CLAUDICATION. This may present as cramping or numbness with physical activity. The location of the pain is associated with the level of blockage. For example, blockage at the abdominal level (distal abdominal aorta) may result in buttock or hip pain. Lower leg arterial blockage may result in calf pain.  As PVD becomes more severe, pain can develop with less physical activity.  In people with severe PVD, leg pain may occur at rest.  Other PVD signs and symptoms:  Leg numbness or weakness.  Coldness in the affected leg or foot, especially when compared to the other leg.  A change in leg color.  Patients with significant PVD are more prone to ulcers or sores on toes, feet or legs. These may take longer to heal or may reoccur. The ulcers or sores can become infected.  If signs and symptoms of PVD are ignored, gangrene may occur. This can result in the loss of toes or loss of an entire limb.  Not all leg pain is related to PVD. Other medical conditions can cause leg pain such  as:  Blood clots (embolism) or Deep Vein Thrombosis.  Inflammation of the blood vessels (vasculitis).  Spinal stenosis. DIAGNOSIS  Diagnosis of PVD can involve several different types of tests. These can include:  Pulse Volume Recording Method (PVR). This test is simple, painless and does not involve the use of X-rays. PVR involves measuring and comparing the blood pressure in the arms and legs. An ABI (Ankle-Brachial Index) is calculated. The normal ratio of blood pressures is 1. As this number becomes smaller, it indicates more severe disease.  < 0.95 - indicates significant narrowing in one or more leg vessels.  <0.8 - there will usually be pain in the foot, leg or buttock with exercise.  <0.4 - will usually have pain in the legs at rest.  <0.25 - usually indicates limb threatening PVD.  Doppler detection of pulses in the legs. This test is painless and checks to see if you have a pulses in your legs/feet.  A dye or contrast material (a substance that highlights the blood vessels so they show up on x-ray) may be given to help your caregiver better see the arteries for the following tests. The dye is eliminated from your body by the kidney's. Your caregiver may order blood work to check your kidney function and other laboratory values before the following tests are performed:  Magnetic Resonance Angiography (MRA). An MRA is a picture study of the blood vessels and arteries. The MRA machine uses a large magnet to produce images of the blood vessels.  Computed Tomography Angiography (CTA). A CTA   is a specialized x-ray that looks at how the blood flows in your blood vessels. An IV may be inserted into your arm so contrast dye can be injected.  Angiogram. Is a procedure that uses x-rays to look at your blood vessels. This procedure is minimally invasive, meaning a small incision (cut) is made in your groin. A small tube (catheter) is then inserted into the artery of your groin. The catheter  is guided to the blood vessel or artery your caregiver wants to examine. Contrast dye is injected into the catheter. X-rays are then taken of the blood vessel or artery. After the images are obtained, the catheter is taken out. TREATMENT  Treatment of PVD involves many interventions which may include:  Lifestyle changes:  Quitting smoking.  Exercise.  Following a low fat, low cholesterol diet.  Control of diabetes.  Foot care is very important to the PVD patient. Good foot care can help prevent infection.  Medication:  Cholesterol-lowering medicine.  Blood pressure medicine.  Anti-platelet drugs.  Certain medicines may reduce symptoms of Intermittent Claudication.  Interventional/Surgical options:  Angioplasty. An Angioplasty is a procedure that inflates a balloon in the blocked artery. This opens the blocked artery to improve blood flow.  Stent Implant. A wire mesh tube (stent) is placed in the artery. The stent expands and stays in place, allowing the artery to remain open.  Peripheral Bypass Surgery. This is a surgical procedure that reroutes the blood around a blocked artery to help improve blood flow. This type of procedure may be performed if Angioplasty or stent implants are not an option. SEEK IMMEDIATE MEDICAL CARE IF:   You develop pain or numbness in your arms or legs.  Your arm or leg turns cold, becomes blue in color.  You develop redness, warmth, swelling and pain in your arms or legs. MAKE SURE YOU:   Understand these instructions.  Will watch your condition.  Will get help right away if you are not doing well or get worse. Document Released: 06/13/2004 Document Revised: 07/29/2011 Document Reviewed: 05/10/2008 ExitCare Patient Information 2015 [employer]. This information is not intended to replace advice given to you by your health care provider. Make sure you discuss any questions you have with your health care provider.   Smoking  Cessation Quitting smoking is important to your health and has many advantages. However, it is not always easy to quit since nicotine is a very addictive drug. Oftentimes, people try 3 times or more before being able to quit. This document explains the best ways for you to prepare to quit smoking. Quitting takes hard work and a lot of effort, but you can do it. ADVANTAGES OF QUITTING SMOKING  You will live longer, feel better, and live better.  Your body will feel the impact of quitting smoking almost immediately.  Within 20 minutes, blood pressure decreases. Your pulse returns to its normal level.  After 8 hours, carbon monoxide levels in the blood return to normal. Your oxygen level increases.  After 24 hours, the chance of having a heart attack starts to decrease. Your breath, hair, and body stop smelling like smoke.  After 48 hours, damaged nerve endings begin to recover. Your sense of taste and smell improve.  After 72 hours, the body is virtually free of nicotine. Your bronchial tubes relax and breathing becomes easier.  After 2 to 12 weeks, lungs can hold more air. Exercise becomes easier and circulation improves.  The risk of having a heart attack, stroke, cancer,   or lung disease is greatly reduced.  After 1 year, the risk of coronary heart disease is cut in half.  After 5 years, the risk of stroke falls to the same as a nonsmoker.  After 10 years, the risk of lung cancer is cut in half and the risk of other cancers decreases significantly.  After 15 years, the risk of coronary heart disease drops, usually to the level of a nonsmoker.  If you are pregnant, quitting smoking will improve your chances of having a healthy baby.  The people you live with, especially any children, will be healthier.  You will have extra money to spend on things other than cigarettes. QUESTIONS TO THINK ABOUT BEFORE ATTEMPTING TO QUIT You may want to talk about your answers with your health care  provider.  Why do you want to quit?  If you tried to quit in the past, what helped and what did not?  What will be the most difficult situations for you after you quit? How will you plan to handle them?  Who can help you through the tough times? Your family? Friends? A health care provider?  What pleasures do you get from smoking? What ways can you still get pleasure if you quit? Here are some questions to ask your health care provider:  How can you help me to be successful at quitting?  What medicine do you think would be best for me and how should I take it?  What should I do if I need more help?  What is smoking withdrawal like? How can I get information on withdrawal? GET READY  Set a quit date.  Change your environment by getting rid of all cigarettes, ashtrays, matches, and lighters in your home, car, or work. Do not let people smoke in your home.  Review your past attempts to quit. Think about what worked and what did not. GET SUPPORT AND ENCOURAGEMENT You have a better chance of being successful if you have help. You can get support in many ways.  Tell your family, friends, and coworkers that you are going to quit and need their support. Ask them not to smoke around you.  Get individual, group, or telephone counseling and support. Programs are available at local hospitals and health centers. Call your local health department for information about programs in your area.  Spiritual beliefs and practices may help some smokers quit.  Download a "quit meter" on your computer to keep track of quit statistics, such as how long you have gone without smoking, cigarettes not smoked, and money saved.  Get a self-help book about quitting smoking and staying off tobacco. LEARN NEW SKILLS AND BEHAVIORS  Distract yourself from urges to smoke. Talk to someone, go for a walk, or occupy your time with a task.  Change your normal routine. Take a different route to work. Drink tea  instead of coffee. Eat breakfast in a different place.  Reduce your stress. Take a hot bath, exercise, or read a book.  Plan something enjoyable to do every day. Reward yourself for not smoking.  Explore interactive web-based programs that specialize in helping you quit. GET MEDICINE AND USE IT CORRECTLY Medicines can help you stop smoking and decrease the urge to smoke. Combining medicine with the above behavioral methods and support can greatly increase your chances of successfully quitting smoking.  Nicotine replacement therapy helps deliver nicotine to your body without the negative effects and risks of smoking. Nicotine replacement therapy includes nicotine gum, lozenges, inhalers,   nasal sprays, and skin patches. Some may be available over-the-counter and others require a prescription.  Antidepressant medicine helps people abstain from smoking, but how this works is unknown. This medicine is available by prescription.  Nicotinic receptor partial agonist medicine simulates the effect of nicotine in your brain. This medicine is available by prescription. Ask your health care provider for advice about which medicines to use and how to use them based on your health history. Your health care provider will tell you what side effects to look out for if you choose to be on a medicine or therapy. Carefully read the information on the package. Do not use any other product containing nicotine while using a nicotine replacement product.  RELAPSE OR DIFFICULT SITUATIONS Most relapses occur within the first 3 months after quitting. Do not be discouraged if you start smoking again. Remember, most people try several times before finally quitting. You may have symptoms of withdrawal because your body is used to nicotine. You may crave cigarettes, be irritable, feel very hungry, cough often, get headaches, or have difficulty concentrating. The withdrawal symptoms are only temporary. They are strongest when you  first quit, but they will go away within 10-14 days. To reduce the chances of relapse, try to:  Avoid drinking alcohol. Drinking lowers your chances of successfully quitting.  Reduce the amount of caffeine you consume. Once you quit smoking, the amount of caffeine in your body increases and can give you symptoms, such as a rapid heartbeat, sweating, and anxiety.  Avoid smokers because they can make you want to smoke.  Do not let weight gain distract you. Many smokers will gain weight when they quit, usually less than 10 pounds. Eat a healthy diet and stay active. You can always lose the weight gained after you quit.  Find ways to improve your mood other than smoking. FOR MORE INFORMATION  www.smokefree.gov  Document Released: 04/30/2001 Document Revised: 09/20/2013 Document Reviewed: 08/15/2011 ExitCare Patient Information 2015 [employer]. This information is not intended to replace advice given to you by your health care provider. Make sure you discuss any questions you have with your health care provider.  

## 2014-02-18 ENCOUNTER — Encounter: Payer: Self-pay | Admitting: Endocrinology

## 2014-02-27 ENCOUNTER — Encounter: Payer: Self-pay | Admitting: Endocrinology

## 2014-03-19 ENCOUNTER — Other Ambulatory Visit: Payer: Self-pay | Admitting: Endocrinology

## 2014-04-18 DIAGNOSIS — Z79891 Long term (current) use of opiate analgesic: Secondary | ICD-10-CM | POA: Diagnosis not present

## 2014-04-28 ENCOUNTER — Encounter (HOSPITAL_COMMUNITY): Payer: Self-pay | Admitting: Cardiovascular Disease

## 2014-05-22 ENCOUNTER — Other Ambulatory Visit: Payer: Self-pay | Admitting: Endocrinology

## 2014-06-04 ENCOUNTER — Other Ambulatory Visit: Payer: Self-pay | Admitting: Family

## 2014-06-23 ENCOUNTER — Ambulatory Visit (INDEPENDENT_AMBULATORY_CARE_PROVIDER_SITE_OTHER): Payer: Medicare Other | Admitting: Podiatry

## 2014-06-23 VITALS — BP 144/85 | HR 77 | Resp 16

## 2014-06-23 DIAGNOSIS — M7750 Other enthesopathy of unspecified foot: Secondary | ICD-10-CM

## 2014-06-23 DIAGNOSIS — M6588 Other synovitis and tenosynovitis, other site: Secondary | ICD-10-CM

## 2014-06-23 NOTE — Progress Notes (Signed)
He presents today for follow-up of his ankle joint capsulitis bilateral. He states that his ankles feel like 100% better however he starting to have pain across the dorsal aspect of the bilateral foot. No change in his past medical history medications or allergies.  Objective: Pulses are palpable bilateral. He has pain on palpation of the extensor digitorum longus tendons and tendon sheaths. There is mild areas of fluctuance in these tendon sheaths.  Assessment: Extensor tendinitis bilateral.  Plan: Injected dexamethasone into the extensor tendon sheath dorsally bilateral. This should alleviate his symptomatology and I will follow-up with him on a when necessary basis

## 2014-07-28 ENCOUNTER — Other Ambulatory Visit: Payer: Self-pay | Admitting: Endocrinology

## 2014-08-15 ENCOUNTER — Encounter (HOSPITAL_COMMUNITY): Payer: Medicare Other

## 2014-08-15 ENCOUNTER — Ambulatory Visit: Payer: Medicare Other | Admitting: Family

## 2014-08-15 ENCOUNTER — Other Ambulatory Visit (HOSPITAL_COMMUNITY): Payer: Medicare Other

## 2014-08-19 ENCOUNTER — Encounter: Payer: Self-pay | Admitting: Endocrinology

## 2014-09-02 ENCOUNTER — Ambulatory Visit (INDEPENDENT_AMBULATORY_CARE_PROVIDER_SITE_OTHER): Payer: Medicare Other | Admitting: Endocrinology

## 2014-09-02 ENCOUNTER — Other Ambulatory Visit: Payer: Self-pay

## 2014-09-02 ENCOUNTER — Encounter: Payer: Self-pay | Admitting: Endocrinology

## 2014-09-02 VITALS — BP 136/86 | HR 80 | Temp 97.9°F | Ht 69.0 in | Wt 166.0 lb

## 2014-09-02 DIAGNOSIS — F411 Generalized anxiety disorder: Secondary | ICD-10-CM | POA: Diagnosis not present

## 2014-09-02 MED ORDER — VENLAFAXINE HCL 75 MG PO TABS: 75.0000 mg | ORAL_TABLET | Freq: Two times a day (BID) | ORAL | Status: DC

## 2014-09-02 MED ORDER — ALPRAZOLAM 0.25 MG PO TABS: 0.2500 mg | ORAL_TABLET | Freq: Two times a day (BID) | ORAL | Status: DC | PRN

## 2014-09-02 MED ORDER — AMLODIPINE BESYLATE 5 MG PO TABS: ORAL_TABLET | ORAL | Status: DC

## 2014-09-02 NOTE — Progress Notes (Signed)
Subjective:    Patient ID: Ronnie Snyder, male    DOB: Sep 20, 1940, 74 y.o.   MRN: [id number]  HPI Pt states of slight tremor throughout the body, and assoc anxiety.  He has not recently consumed EtOH.  He denies drugs/suicidal thoughts/domestic violence. Past Medical History  Diagnosis Date  . Hx of colonic polyps   . GERD (gastroesophageal reflux disease)   . HTN (hypertension)   . Hepatic steatosis   . Abdominal aortic aneurysm     3 cm aneurysm followed with serial ultrasound  . Hypothyroidism   . PAD (peripheral artery disease)   . High cholesterol     "at one time; it's fine now" (10/13/2012)  . H/O hiatal hernia   . Arthritis     "in my feet" (10/13/2012)  . Skin cancer     "burned them off my arm and such" (10/13/2012)    Past Surgical History  Procedure Laterality Date  . Esophagogastroduodenoscopy    . Gated spect wall motion stress cardiolite  02/10/2002  . Nose surgery    . Hip fracture surgery Right 1990's  . Femoral endarterectomy Left 01/23/12    Left Endarterectomy  with bovie patch Angioplasy  . Inguinal hernia repair Right     "years ago" (10/13/2012)  . Tonsillectomy      "I was a chld" (10/13/2012)  . Angioplasty / stenting femoral Left 10/13/2012  . Abdominal angiogram N/A 12/04/2011    Procedure: ABDOMINAL ANGIOGRAM;  Surgeon: Sherren Mocha, MD;  Location: [facility] CATH LAB;  Service: Cardiovascular;  Laterality: N/A;  . Abdominal aortagram N/A 10/13/2012    Procedure: ABDOMINAL AORTAGRAM;  Surgeon: Serafina Mitchell, MD;  Location: [facility] CATH LAB;  Service: Cardiovascular;  Laterality: N/A;    History   Social History  . Marital Status: Married    Spouse Name: N/A  . Number of Children: N/A  . Years of Education: N/A   Occupational History  . retired     Therapist, music   Social History Main Topics  . Smoking status: Light Tobacco Smoker -- 1.00 packs/day for 57 years    Types: Cigarettes  . Smokeless tobacco: Never Used     Comment: pt states there is  no hope for quitting  . Alcohol Use: 3.6 oz/week    6 Cans of beer per week     Comment: 10/13/2012 "drink a 6 pack/wk"  . Drug Use: No  . Sexual Activity: No   Other Topics Concern  . Not on file   Social History Narrative    Current Outpatient Prescriptions on File Prior to Visit  Medication Sig Dispense Refill  . acetaminophen (TYLENOL) 500 MG tablet Take 250 mg by mouth every 6 (six) hours as needed for pain.    Marland Kitchen aspirin 81 MG tablet Take 81 mg by mouth every other day.     . Cholecalciferol (VITAMIN D-3 PO) Take 1 tablet by mouth 2 (two) times a week.     . clobetasol (TEMOVATE) 0.05 % external solution     . clopidogrel (PLAVIX) 75 MG tablet take 1 tablet by mouth once daily 30 tablet 6  . fluocinonide cream (LIDEX) 8.92 % Apply 1 application topically 4 (four) times daily. As needed for rash 30 g 1  . lansoprazole (PREVACID) 30 MG capsule Take 30 mg by mouth every other day.    . levothyroxine (SYNTHROID, LEVOTHROID) 50 MCG tablet TAKE 1 TABLET (50 MCG TOTAL) BY MOUTH DAILY BEFORE BREAKFAST. (NEED APPOINTMENT FOR FURTHER REFILLS)  90 tablet 0  . losartan-hydrochlorothiazide (HYZAAR) 50-12.5 MG per tablet Take 1 tablet by mouth daily. 30 tablet 11  . tamsulosin (FLOMAX) 0.4 MG CAPS capsule Take 1 capsule (0.4 mg total) by mouth daily. 30 capsule 3  . trolamine salicylate (ASPERCREME) 10 % cream Apply 1 application topically daily as needed (for muscle pain).     No current facility-administered medications on file prior to visit.    Allergies  Allergen Reactions  . Vicodin [Hydrocodone-Acetaminophen] Nausea Only  . Atorvastatin Nausea Only    REACTION: sick  . Other     ALL PAINS/NARCOTIC MEDS-CANNOT TOLERATE WELL  . Pioglitazone Nausea Only  . Penicillins Rash    Family History  Problem Relation Age of Onset  . Hypertension Mother   . Hypertension Father   . Hypertension Sister   . Hypertension Sister     BP 136/86 mmHg  Pulse 80  Temp(Src) 97.9 F (36.6 C)  (Oral)  Ht 5\' 9"  (1.753 m)  Wt 166 lb (75.297 kg)  BMI 24.50 kg/m2  SpO2 90%   Review of Systems He also has insomnia.  No change in chronic pain of the back, legs, and feet.     Objective:   Physical Exam VITAL SIGNS:  See vs page GENERAL: no distress PSYCH: Alert and well-oriented.  Does not appear anxious nor depressed now.     Lab Results  Component Value Date   TSH 3.53 09/23/2013      Assessment & Plan:  Anxiety, worse. Chronic pain syndrome, persistent.  In this setting, we'll need to try to avoid multiple controlled substances.  Patient is advised the following: Patient Instructions  i have sent a prescription to your pharmacy, to take daily. Here is a prescription for a pill to take as needed. Please come back for a "medicare wellness" appointment in 1 month (must be after 09/24/14).

## 2014-09-02 NOTE — Patient Instructions (Signed)
i have sent a prescription to your pharmacy, to take daily. Here is a prescription for a pill to take as needed. Please come back for a "medicare wellness" appointment in 1 month (must be after 09/24/14).

## 2014-10-03 ENCOUNTER — Ambulatory Visit (INDEPENDENT_AMBULATORY_CARE_PROVIDER_SITE_OTHER): Payer: Medicare Other | Admitting: Endocrinology

## 2014-10-03 ENCOUNTER — Encounter: Payer: Self-pay | Admitting: Endocrinology

## 2014-10-03 VITALS — BP 138/86 | HR 69 | Temp 98.9°F | Ht 69.0 in | Wt 160.0 lb

## 2014-10-03 DIAGNOSIS — E7800 Pure hypercholesterolemia, unspecified: Secondary | ICD-10-CM

## 2014-10-03 DIAGNOSIS — E871 Hypo-osmolality and hyponatremia: Secondary | ICD-10-CM

## 2014-10-03 DIAGNOSIS — E1151 Type 2 diabetes mellitus with diabetic peripheral angiopathy without gangrene: Secondary | ICD-10-CM

## 2014-10-03 DIAGNOSIS — I10 Essential (primary) hypertension: Secondary | ICD-10-CM

## 2014-10-03 DIAGNOSIS — E039 Hypothyroidism, unspecified: Secondary | ICD-10-CM | POA: Diagnosis not present

## 2014-10-03 DIAGNOSIS — Z0000 Encounter for general adult medical examination without abnormal findings: Secondary | ICD-10-CM | POA: Diagnosis not present

## 2014-10-03 DIAGNOSIS — Z125 Encounter for screening for malignant neoplasm of prostate: Secondary | ICD-10-CM | POA: Diagnosis not present

## 2014-10-03 DIAGNOSIS — R21 Rash and other nonspecific skin eruption: Secondary | ICD-10-CM

## 2014-10-03 LAB — BASIC METABOLIC PANEL
BUN: 10 mg/dL (ref 6–23)
CO2: 34 mEq/L — ABNORMAL HIGH (ref 19–32)
CREATININE: 0.69 mg/dL (ref 0.40–1.50)
Calcium: 10.1 mg/dL (ref 8.4–10.5)
Chloride: 94 mEq/L — ABNORMAL LOW (ref 96–112)
GFR: 119.48 mL/min (ref >60.00)
GLUCOSE: 122 mg/dL — AB (ref 70–99)
POTASSIUM: 4.3 meq/L (ref 3.5–5.1)
SODIUM: 134 meq/L — AB (ref 135–145)

## 2014-10-03 LAB — LIPID PANEL
CHOL/HDL RATIO: 6
CHOLESTEROL: 247 mg/dL — AB (ref 0–200)
HDL: 43.6 mg/dL (ref >39.00)
NonHDL: 203.4
TRIGLYCERIDES: 246 mg/dL — AB (ref 0.0–149.0)
VLDL: 49.2 mg/dL — ABNORMAL HIGH (ref 0.0–40.0)

## 2014-10-03 LAB — HEPATIC FUNCTION PANEL
ALK PHOS: 77 U/L (ref 39–117)
ALT: 22 U/L (ref 0–53)
AST: 20 U/L (ref 0–37)
Albumin: 4.4 g/dL (ref 3.5–5.2)
BILIRUBIN TOTAL: 0.6 mg/dL (ref 0.2–1.2)
Bilirubin, Direct: 0.1 mg/dL (ref 0.0–0.3)
Total Protein: 7.2 g/dL (ref 6.0–8.3)

## 2014-10-03 LAB — URINALYSIS, ROUTINE W REFLEX MICROSCOPIC
Bilirubin Urine: NEGATIVE
Hgb urine dipstick: NEGATIVE
Ketones, ur: NEGATIVE
Leukocytes, UA: NEGATIVE
Nitrite: NEGATIVE
PH: 7 (ref 5.0–8.0)
RBC / HPF: NONE SEEN (ref 0–?)
SPECIFIC GRAVITY, URINE: 1.01 (ref 1.000–1.030)
Total Protein, Urine: NEGATIVE
URINE GLUCOSE: NEGATIVE
Urobilinogen, UA: 0.2 (ref 0.0–1.0)

## 2014-10-03 LAB — MICROALBUMIN / CREATININE URINE RATIO
Creatinine,U: 75.1 mg/dL
MICROALB UR: 1.7 mg/dL (ref 0.0–1.9)
Microalb Creat Ratio: 2.3 mg/g (ref 0.0–30.0)

## 2014-10-03 LAB — PSA, MEDICARE: PSA: 0.28 ng/mL (ref 0.10–4.00)

## 2014-10-03 LAB — HEMOGLOBIN A1C: Hgb A1c MFr Bld: 5.8 % (ref 4.6–6.5)

## 2014-10-03 LAB — CBC WITH DIFFERENTIAL/PLATELET
BASOS PCT: 0.6 % (ref 0.0–3.0)
Basophils Absolute: 0 10*3/uL (ref 0.0–0.1)
EOS PCT: 3.6 % (ref 0.0–5.0)
Eosinophils Absolute: 0.3 10*3/uL (ref 0.0–0.7)
HCT: 44.9 % (ref 39.0–52.0)
Hemoglobin: 15.1 g/dL (ref 13.0–17.0)
LYMPHS ABS: 2.1 10*3/uL (ref 0.7–4.0)
Lymphocytes Relative: 28.4 % (ref 12.0–46.0)
MCHC: 33.6 g/dL (ref 30.0–36.0)
MCV: 87.4 fl (ref 78.0–100.0)
Monocytes Absolute: 0.7 10*3/uL (ref 0.1–1.0)
Monocytes Relative: 9.4 % (ref 3.0–12.0)
Neutro Abs: 4.2 10*3/uL (ref 1.4–7.7)
Neutrophils Relative %: 58 % (ref 43.0–77.0)
Platelets: 178 10*3/uL (ref 150.0–400.0)
RBC: 5.13 Mil/uL (ref 4.22–5.81)
RDW: 14.3 % (ref 11.5–15.5)
WBC: 7.3 10*3/uL (ref 4.0–10.5)

## 2014-10-03 LAB — TSH: TSH: 5.13 u[IU]/mL — AB (ref 0.35–4.50)

## 2014-10-03 LAB — LDL CHOLESTEROL, DIRECT: Direct LDL: 169 mg/dL

## 2014-10-03 MED ORDER — TRIAMCINOLONE ACETONIDE 0.1 % EX CREA: 1.0000 "application " | TOPICAL_CREAM | Freq: Four times a day (QID) | CUTANEOUS | Status: DC

## 2014-10-03 MED ORDER — ALPRAZOLAM 0.25 MG PO TABS: 0.2500 mg | ORAL_TABLET | Freq: Two times a day (BID) | ORAL | Status: DC | PRN

## 2014-10-03 MED ORDER — LEVOTHYROXINE SODIUM 75 MCG PO TABS: 75.0000 ug | ORAL_TABLET | Freq: Every day | ORAL | Status: DC

## 2014-10-03 NOTE — Progress Notes (Signed)
we discussed code status.  pt requests full code, but would not want to be started or maintained on artificial life-support measures if there was not a reasonable chance of recovery 

## 2014-10-03 NOTE — Patient Instructions (Addendum)
Here is a prescription for the rash. blood tests are requested for you today.  We'll let you know about the results. good diet and exercise significantly improve your health.  please let me know if you wish to be referred to a dietician.  high blood sugar is very risky to your health.  you should see an eye doctor and dentist every year.  It is very important to get all recommended vaccinations.  please consider these measures for your health:  minimize alcohol.  do not use tobacco products.  have a colonoscopy at least every 10 years from age 54.  keep firearms safely stored.  always use seat belts.  have working smoke alarms in your home.  see an eye doctor and dentist regularly.  never drive under the influence of alcohol or drugs (including prescription drugs).  those with fair skin should take precautions against the sun.  Please return in 1 year.

## 2014-10-03 NOTE — Progress Notes (Signed)
Subjective:    Patient ID: Ronnie Snyder, male    DOB: [birth year], 74 y.o.   MRN: [id number]  HPI Pt states few years of moderate pain at the left calf, but no assoc numbness.  Pt says he did not tolerate effexor.  He says it caused anxiety Past Medical History  Diagnosis Date  . Hx of colonic polyps   . GERD (gastroesophageal reflux disease)   . HTN (hypertension)   . Hepatic steatosis   . Abdominal aortic aneurysm     3 cm aneurysm followed with serial ultrasound  . Hypothyroidism   . PAD (peripheral artery disease)   . High cholesterol     "at one time; it's fine now" (10/13/2012)  . H/O hiatal hernia   . Arthritis     "in my feet" (10/13/2012)  . Skin cancer     "burned them off my arm and such" (10/13/2012)    Past Surgical History  Procedure Laterality Date  . Esophagogastroduodenoscopy    . Gated spect wall motion stress cardiolite  02/10/2002  . Nose surgery    . Hip fracture surgery Right 1990's  . Femoral endarterectomy Left 01/23/12    Left Endarterectomy  with bovie patch Angioplasy  . Inguinal hernia repair Right     "years ago" (10/13/2012)  . Tonsillectomy      "I was a chld" (10/13/2012)  . Angioplasty / stenting femoral Left 10/13/2012  . Abdominal angiogram N/A 12/04/2011    Procedure: ABDOMINAL ANGIOGRAM;  Surgeon: Sherren Mocha, MD;  Location: [facility] CATH LAB;  Service: Cardiovascular;  Laterality: N/A;  . Abdominal aortagram N/A 10/13/2012    Procedure: ABDOMINAL AORTAGRAM;  Surgeon: Serafina Mitchell, MD;  Location: [facility] CATH LAB;  Service: Cardiovascular;  Laterality: N/A;    History   Social History  . Marital Status: Married    Spouse Name: N/A  . Number of Children: N/A  . Years of Education: N/A   Occupational History  . retired     Therapist, music   Social History Main Topics  . Smoking status: Light Tobacco Smoker -- 1.00 packs/day for 57 years    Types: Cigarettes  . Smokeless tobacco: Never Used     Comment: pt states there is no hope for  quitting  . Alcohol Use: 3.6 oz/week    6 Cans of beer per week     Comment: 10/13/2012 "drink a 6 pack/wk"  . Drug Use: No  . Sexual Activity: No   Other Topics Concern  . Not on file   Social History Narrative    Current Outpatient Prescriptions on File Prior to Visit  Medication Sig Dispense Refill  . acetaminophen (TYLENOL) 500 MG tablet Take 250 mg by mouth every 6 (six) hours as needed for pain.    Marland Kitchen amLODipine (NORVASC) 5 MG tablet TAKE 1 TABLET EVERY DAY 90 tablet 3  . aspirin 81 MG tablet Take 81 mg by mouth every other day.     . Cholecalciferol (VITAMIN D-3 PO) Take 1 tablet by mouth 2 (two) times a week.     . clobetasol (TEMOVATE) 0.05 % external solution     . clopidogrel (PLAVIX) 75 MG tablet take 1 tablet by mouth once daily 30 tablet 6  . fluocinonide cream (LIDEX) 4.09 % Apply 1 application topically 4 (four) times daily. As needed for rash 30 g 1  . lansoprazole (PREVACID) 30 MG capsule Take 30 mg by mouth every other day.    . losartan-hydrochlorothiazide (HYZAAR)  50-12.5 MG per tablet Take 1 tablet by mouth daily. 30 tablet 11  . tamsulosin (FLOMAX) 0.4 MG CAPS capsule Take 1 capsule (0.4 mg total) by mouth daily. 30 capsule 3  . trolamine salicylate (ASPERCREME) 10 % cream Apply 1 application topically daily as needed (for muscle pain).     No current facility-administered medications on file prior to visit.    Allergies  Allergen Reactions  . Vicodin [Hydrocodone-Acetaminophen] Nausea Only  . Atorvastatin Nausea Only    REACTION: sick  . Other     ALL PAINS/NARCOTIC MEDS-CANNOT TOLERATE WELL  . Pioglitazone Nausea Only  . Penicillins Rash    Family History  Problem Relation Age of Onset  . Hypertension Mother   . Hypertension Father   . Hypertension Sister   . Hypertension Sister     BP 138/86 mmHg  Pulse 69  Temp(Src) 98.9 F (37.2 C) (Oral)  Ht 5\' 9"  (1.753 m)  Wt 160 lb (72.576 kg)  BMI 23.62 kg/m2  SpO2 94%   Review of Systems He  has a rash on the chest.  He also has itching there.      Objective:   Physical Exam VITAL SIGNS:  See vs page GENERAL: no distress Pulses: dorsalis pedis intact bilat.   MSK: no deformity of the feet CV: no leg edema Skin:  no ulcer on the feet.  normal color and temp on the feet. Neuro: sensation is intact to touch on the feet Ext: left calf is normal.      Lab Results  Component Value Date   CHOL 247* 10/03/2014   HDL 43.60 10/03/2014   LDLCALC 144* 09/23/2013   LDLDIRECT 169.0 10/03/2014   TRIG 246.0* 10/03/2014   CHOLHDL 6 10/03/2014   Lab Results  Component Value Date   TSH 5.13* 10/03/2014      Assessment & Plan:  Calf pain, new to me.  No evidence of DVT: i offered ref sports medicine.   Pt declines Rash, new, prob due to eczema Dyslipidemia: therapy is limited by multiple perceived drug intolerances Anxiety: not well-controlled. Hypothyroidism: he needs increased rx.   Addendum: Do you want a prescription for a medication for the cholesterol (different from what you took in the past)?   Also, we need to slightly increase the thyroid pill. i have sent a prescription to your pharmacy   Subjective:   Patient here for Medicare annual wellness visit and management of other chronic and acute problems.     Risk factors: advanced age    62 of Physicians Providing Medical Care to Patient:  See "snapshot"   Activities of Daily Living: In your present state of health, do you have any difficulty performing the following activities?:  Preparing food and eating?: No  Bathing yourself: No  Getting dressed: No  Using the toilet:No  Moving around from place to place: No  In the past year have you fallen or had a near fall?: No    Home Safety: Has smoke detector and wears seat belts. Firearms are safely stored. No excess sun exposure.  Diet and Exercise  Current exercise habits: pt says not good Dietary issues discussed: pt reports a healthy diet   Depression  Screen  Q1: Over the past two weeks, have you felt down, depressed or hopeless?no  Q2: Over the past two weeks, have you felt little interest or pleasure in doing things? no   The following portions of the patient's history were reviewed and updated as appropriate: allergies,  current medications, past family history, past medical history, past social history, past surgical history and problem list.   Review of Systems  Denies hearing loss, and visual loss Objective:   Vision:  Sees opthalmologist Hearing: grossly normal Body mass index:  See vs page Msk: pt easily and quickly performs "get-up-and-go" from a sitting position Cognitive Impairment Assessment: cognition, memory and judgment appear normal.  remembers 3/3 at 5 minutes.  excellent recall.  can easily read and write a sentence.  alert and oriented x 3.    Assessment:   Medicare wellness utd on preventive parameters    Plan:   During the course of the visit the patient was educated and counseled about appropriate screening and preventive services including:       Fall prevention    Diabetes screening  Nutrition counseling   Vaccines / LABS Zostavax / Pneumococcal Vaccine  today  PSA  Patient Instructions (the written plan) was given to the patient.

## 2014-10-18 ENCOUNTER — Other Ambulatory Visit: Payer: Self-pay

## 2014-10-18 MED ORDER — LOSARTAN POTASSIUM-HCTZ 50-12.5 MG PO TABS: 1.0000 | ORAL_TABLET | Freq: Every day | ORAL | Status: DC

## 2014-10-25 ENCOUNTER — Encounter: Payer: Self-pay | Admitting: Endocrinology

## 2014-11-08 ENCOUNTER — Ambulatory Visit (INDEPENDENT_AMBULATORY_CARE_PROVIDER_SITE_OTHER): Payer: Medicare Other | Admitting: Podiatry

## 2014-11-08 ENCOUNTER — Encounter: Payer: Self-pay | Admitting: Podiatry

## 2014-11-08 VITALS — BP 140/80 | HR 77 | Resp 12

## 2014-11-08 DIAGNOSIS — M7750 Other enthesopathy of unspecified foot: Secondary | ICD-10-CM | POA: Diagnosis not present

## 2014-11-09 NOTE — Progress Notes (Signed)
He presents today for follow-up of his ankle pain. He states that they were doing great he says but now they're sore back to hurting again as he refers to his ankles. His foot bilateral is doing really well he says.  Objective: Vital signs are stable he is alert and oriented 3. Pulses are palpable bilateral. He has pain on end range of motion particularly dorsiflexion of the bilateral ankles.  Assessment: Capsulitis of the ankle bilateral osteoarthritic changes of the ankle bilateral.  Plan: Intra-articular injections with 20 mg of Kenalog to the anterior ankles after sterile Betadine skin prep. Follow up with him as needed.

## 2014-12-19 ENCOUNTER — Other Ambulatory Visit: Payer: Self-pay | Admitting: Family

## 2015-01-16 ENCOUNTER — Other Ambulatory Visit: Payer: Self-pay

## 2015-01-16 MED ORDER — LOSARTAN POTASSIUM-HCTZ 50-12.5 MG PO TABS: 1.0000 | ORAL_TABLET | Freq: Every day | ORAL | Status: DC

## 2015-02-20 ENCOUNTER — Ambulatory Visit: Payer: Medicare Other | Admitting: Family

## 2015-02-20 ENCOUNTER — Encounter (HOSPITAL_COMMUNITY): Payer: Medicare Other

## 2015-02-24 ENCOUNTER — Ambulatory Visit: Payer: Medicare Other | Admitting: Family

## 2015-04-10 ENCOUNTER — Other Ambulatory Visit: Payer: Self-pay

## 2015-04-10 MED ORDER — LOSARTAN POTASSIUM-HCTZ 50-12.5 MG PO TABS: 1.0000 | ORAL_TABLET | Freq: Every day | ORAL | Status: DC

## 2015-05-01 DIAGNOSIS — H2513 Age-related nuclear cataract, bilateral: Secondary | ICD-10-CM | POA: Diagnosis not present

## 2015-05-01 DIAGNOSIS — H01025 Squamous blepharitis left lower eyelid: Secondary | ICD-10-CM | POA: Diagnosis not present

## 2015-05-01 DIAGNOSIS — H01021 Squamous blepharitis right upper eyelid: Secondary | ICD-10-CM | POA: Diagnosis not present

## 2015-05-01 DIAGNOSIS — H40013 Open angle with borderline findings, low risk, bilateral: Secondary | ICD-10-CM | POA: Diagnosis not present

## 2015-05-01 DIAGNOSIS — H01024 Squamous blepharitis left upper eyelid: Secondary | ICD-10-CM | POA: Diagnosis not present

## 2015-05-01 DIAGNOSIS — H01022 Squamous blepharitis right lower eyelid: Secondary | ICD-10-CM | POA: Diagnosis not present

## 2015-05-09 ENCOUNTER — Ambulatory Visit (INDEPENDENT_AMBULATORY_CARE_PROVIDER_SITE_OTHER): Payer: Medicare Other | Admitting: Podiatry

## 2015-05-09 ENCOUNTER — Encounter: Payer: Self-pay | Admitting: Podiatry

## 2015-05-09 VITALS — BP 166/90 | HR 74 | Resp 16

## 2015-05-09 DIAGNOSIS — M7750 Other enthesopathy of unspecified foot: Secondary | ICD-10-CM

## 2015-05-09 NOTE — Progress Notes (Signed)
He presents today after having not seen him for more than 6 months. He presents with a chief complaint of ankle capsulitis and osteoarthritis.   Objective: Vital signs are stable pulses remain intact he does have a history of peripheral vascular disease. He has pain on range of motion with sharp dorsiflexion and on palpation of the ankle joint bilaterally.   Assessment: capsulitis osteoarthritis of the ankle joint bilaterally.  Plan: discussed etiology pathology conservative versus surgical therapies. After sterile Betadine skin prep I injected 20 mg of Kenalog into the ankle joint. The I will follow-up with him on an as-needed basis.

## 2015-07-04 ENCOUNTER — Encounter (HOSPITAL_COMMUNITY): Payer: Medicare Other

## 2015-07-04 ENCOUNTER — Ambulatory Visit: Payer: Medicare Other | Admitting: Family

## 2015-07-09 ENCOUNTER — Other Ambulatory Visit: Payer: Self-pay | Admitting: Endocrinology

## 2015-07-09 ENCOUNTER — Other Ambulatory Visit: Payer: Self-pay | Admitting: Family

## 2015-07-14 ENCOUNTER — Encounter (HOSPITAL_COMMUNITY): Payer: Medicare Other

## 2015-07-14 ENCOUNTER — Ambulatory Visit: Payer: Medicare Other | Admitting: Family

## 2015-08-04 ENCOUNTER — Other Ambulatory Visit: Payer: Self-pay | Admitting: Endocrinology

## 2015-08-08 ENCOUNTER — Other Ambulatory Visit: Payer: Self-pay | Admitting: *Deleted

## 2015-08-08 DIAGNOSIS — I6523 Occlusion and stenosis of bilateral carotid arteries: Secondary | ICD-10-CM

## 2015-08-08 DIAGNOSIS — I739 Peripheral vascular disease, unspecified: Secondary | ICD-10-CM

## 2015-08-08 DIAGNOSIS — Z48812 Encounter for surgical aftercare following surgery on the circulatory system: Secondary | ICD-10-CM

## 2015-08-09 ENCOUNTER — Encounter: Payer: Self-pay | Admitting: Family

## 2015-08-16 ENCOUNTER — Ambulatory Visit (INDEPENDENT_AMBULATORY_CARE_PROVIDER_SITE_OTHER)
Admission: RE | Admit: 2015-08-16 | Discharge: 2015-08-16 | Disposition: A | Discharge status: Home / Self Care | Payer: Medicare Other | Source: Ambulatory Visit | Attending: Family | Admitting: Family

## 2015-08-16 ENCOUNTER — Ambulatory Visit (HOSPITAL_COMMUNITY)
Admission: RE | Admit: 2015-08-16 | Discharge: 2015-08-16 | Disposition: A | Discharge status: Home / Self Care | Payer: Medicare Other | Source: Ambulatory Visit | Attending: Family | Admitting: Family

## 2015-08-16 ENCOUNTER — Other Ambulatory Visit: Payer: Self-pay | Admitting: *Deleted

## 2015-08-16 ENCOUNTER — Ambulatory Visit (INDEPENDENT_AMBULATORY_CARE_PROVIDER_SITE_OTHER): Payer: Medicare Other | Admitting: Family

## 2015-08-16 VITALS — Temp 96.7°F | Resp 18 | Ht 69.0 in | Wt 155.0 lb

## 2015-08-16 DIAGNOSIS — I739 Peripheral vascular disease, unspecified: Secondary | ICD-10-CM

## 2015-08-16 DIAGNOSIS — F17200 Nicotine dependence, unspecified, uncomplicated: Secondary | ICD-10-CM | POA: Insufficient documentation

## 2015-08-16 DIAGNOSIS — Z48812 Encounter for surgical aftercare following surgery on the circulatory system: Secondary | ICD-10-CM

## 2015-08-16 DIAGNOSIS — Z720 Tobacco use: Secondary | ICD-10-CM

## 2015-08-16 DIAGNOSIS — Z9862 Peripheral vascular angioplasty status: Secondary | ICD-10-CM | POA: Diagnosis not present

## 2015-08-16 DIAGNOSIS — I779 Disorder of arteries and arterioles, unspecified: Secondary | ICD-10-CM | POA: Diagnosis not present

## 2015-08-16 DIAGNOSIS — Z959 Presence of cardiac and vascular implant and graft, unspecified: Secondary | ICD-10-CM | POA: Diagnosis not present

## 2015-08-16 DIAGNOSIS — I6523 Occlusion and stenosis of bilateral carotid arteries: Secondary | ICD-10-CM

## 2015-08-16 DIAGNOSIS — Z4889 Encounter for other specified surgical aftercare: Secondary | ICD-10-CM

## 2015-08-16 NOTE — Progress Notes (Signed)
[facility] [place] HISTORY AND PHYSICAL   MRN : [id number]  History of Present Illness:   Ronnie Snyder is a 75 y.o. male status post left iliofemoral endarterectomy with bovine pericardial patch angioplasty on 01/23/2012. This was done by Dr. Scot Dock since Dr. Trula Slade was called away to an emergency. This was done in the setting of left lower extremity claudication.  On patient's visit in January 2014, options were discussed with the patient. Dr. Trula Slade felt these included continued medical management with the addition of Trental. He could also undergo repeat angiography and potential stent placement, versus surgical bypass. After a well informed discussion patient and Dr. Trula Slade decided to try Trental. Patient did have a good response to Pletal but could not tolerate it. Hopefully he will be able to tolerate his new medication; note that pt is no longer taking Trenatal. Dr. Trula Slade also talked to patient about participating in [place]. He was receptive. Patient was scheduled to come back in 6 months for followup with the advice that If his symptoms deteriorated between then and the scheduled follow up, that angiography would be done.   On 10/10/2012 patient underwent an aortogram by Dr. Trula Slade and the following was done:   -  atherectomy, left superficial femoral and popliteal artery   -  stent, left superficial femoral and popliteal   Patient complains of arthritis in feet and ankles and gets cortisone injections both ankles about every 6 months.  Patient feels he got some left calf relief about 3 weeks after the stent placement, then left calf, shin, ankle pain returned with walking.  Patient states that both Trental and Pletal caused him to feel "swimmy headed" and is taking oxycodone, 1/2 tablet at HS or Tylenol or Advil for pain releif.  Patient denies any claudication or other pain in the right leg.  He walks about 1/2 mile and has left calf steady  pain, relieved by rest or heat.  Has aching in lower leg at night, somewhat relieved by heat.  Patient denies history of DVT or PE.  Patient denies non healing ulcers on lower extremities.  Patient denies New Medical or Surgical History:  He denies history of stroke or TIA.   His PCP gave him a referral to go to pain management, pt states he may have left sciatic nerve issues.  He intermitently hears his heartbeat in his ears in the last few months  Pt Diabetic: No  Pt smoker: smoker (1/2 ppd x 60 yrs)   Pt meds include:  Statin :No, tried 2 or 3 meds and caused stomach pain  Betablocker: No  ASA: 81 mg, pt states "my blood is getting too thin" and does not take daily   Other anticoagulants/antiplatelets: Plavix    Current Outpatient Prescriptions  Medication Sig Dispense Refill  . acetaminophen (TYLENOL) 500 MG tablet Take 250 mg by mouth every 6 (six) hours as needed for pain.    Marland Kitchen ALPRAZolam (XANAX) 0.25 MG tablet Take 1 tablet (0.25 mg total) by mouth 2 (two) times daily as needed for anxiety. 30 tablet 0  . amLODipine (NORVASC) 5 MG tablet TAKE 1 TABLET EVERY DAY 90 tablet 3  . aspirin 81 MG tablet Take 81 mg by mouth every other day.     . Cholecalciferol (VITAMIN D-3 PO) Take 1 tablet by mouth 2 (two) times a week.     . clobetasol (TEMOVATE) 0.05 % external solution     . clopidogrel (PLAVIX) 75 MG tablet take  1 tablet by mouth once daily 30 tablet 6  . fluocinonide cream (LIDEX) [id number] % Apply 1 application topically 4 (four) times daily. As needed for rash 30 g 1  . lansoprazole (PREVACID) 30 MG capsule Take 30 mg by mouth every other day.    . levothyroxine (SYNTHROID, LEVOTHROID) 75 MCG tablet TAKE 1 TABLET (75 MCG TOTAL) BY MOUTH DAILY BEFORE BREAKFAST. 90 tablet 3  . losartan-hydrochlorothiazide (HYZAAR) 50-12.5 MG tablet take 1 tablet by mouth once daily 30 tablet 2  . tamsulosin (FLOMAX) 0.4 MG CAPS capsule Take 1 capsule (0.4 mg total) by mouth daily. 30  capsule 3  . triamcinolone cream (KENALOG) 0.1 % Apply 1 application topically 4 (four) times daily. As needed for itching 45 g 3  . trolamine salicylate (ASPERCREME) 10 % cream Apply 1 application topically daily as needed (for muscle pain).     No current facility-administered medications for this visit.    Past Medical History  Diagnosis Date  . Hx of colonic polyps   . GERD (gastroesophageal reflux disease)   . HTN (hypertension)   . Hepatic steatosis   . Abdominal aortic aneurysm (HCC)     3 cm aneurysm followed with serial ultrasound  . Hypothyroidism   . PAD (peripheral artery disease) ([facility])   . High cholesterol     "at one time; it's fine now" (10/13/2012)  . H/O hiatal hernia   . Arthritis     "in my feet" (10/13/2012)  . Skin cancer     "burned them off my arm and such" (10/13/2012)    Social History Social History  Substance Use Topics  . Smoking status: Light Tobacco Smoker -- 1.00 packs/day for 57 years    Types: Cigarettes  . Smokeless tobacco: Never Used     Comment: pt states there is no hope for quitting  . Alcohol Use: 3.6 oz/week    6 Cans of beer per week     Comment: 10/13/2012 "drink a 6 pack/wk"    Family History Family History  Problem Relation Age of Onset  . Hypertension Mother   . Hypertension Father   . Hypertension Sister   . Hypertension Sister     Surgical History Past Surgical History  Procedure Laterality Date  . Esophagogastroduodenoscopy    . Gated spect wall motion stress cardiolite  02/10/2002  . Nose surgery    . Hip fracture surgery Right 1990's  . Femoral endarterectomy Left 01/23/12    Left Endarterectomy  with bovie patch Angioplasy  . Inguinal hernia repair Right     "years ago" (10/13/2012)  . Tonsillectomy      "I was a chld" (10/13/2012)  . Angioplasty / stenting femoral Left 10/13/2012  . Abdominal angiogram N/A 12/04/2011    Procedure: ABDOMINAL ANGIOGRAM;  Surgeon: Sherren Mocha, MD;  Location: [facility] CATH LAB;  Service:  Cardiovascular;  Laterality: N/A;  . Abdominal aortagram N/A 10/13/2012    Procedure: ABDOMINAL AORTAGRAM;  Surgeon: Serafina Mitchell, MD;  Location: [facility] CATH LAB;  Service: Cardiovascular;  Laterality: N/A;    Allergies  Allergen Reactions  . Vicodin [Hydrocodone-Acetaminophen] Nausea Only  . Atorvastatin Nausea Only    REACTION: sick  . Other     ALL PAINS/NARCOTIC MEDS-CANNOT TOLERATE WELL  . Pioglitazone Nausea Only  . Penicillins Rash    Current Outpatient Prescriptions  Medication Sig Dispense Refill  . acetaminophen (TYLENOL) 500 MG tablet Take 250 mg by mouth every 6 (six) hours as needed for pain.    Marland Kitchen  ALPRAZolam (XANAX) 0.25 MG tablet Take 1 tablet (0.25 mg total) by mouth 2 (two) times daily as needed for anxiety. 30 tablet 0  . amLODipine (NORVASC) 5 MG tablet TAKE 1 TABLET EVERY DAY 90 tablet 3  . aspirin 81 MG tablet Take 81 mg by mouth every other day.     . Cholecalciferol (VITAMIN D-3 PO) Take 1 tablet by mouth 2 (two) times a week.     . clobetasol (TEMOVATE) 0.05 % external solution     . clopidogrel (PLAVIX) 75 MG tablet take 1 tablet by mouth once daily 30 tablet 6  . fluocinonide cream (LIDEX) AB-123456789 % Apply 1 application topically 4 (four) times daily. As needed for rash 30 g 1  . lansoprazole (PREVACID) 30 MG capsule Take 30 mg by mouth every other day.    . levothyroxine (SYNTHROID, LEVOTHROID) 75 MCG tablet TAKE 1 TABLET (75 MCG TOTAL) BY MOUTH DAILY BEFORE BREAKFAST. 90 tablet 3  . losartan-hydrochlorothiazide (HYZAAR) 50-12.5 MG tablet take 1 tablet by mouth once daily 30 tablet 2  . tamsulosin (FLOMAX) 0.4 MG CAPS capsule Take 1 capsule (0.4 mg total) by mouth daily. 30 capsule 3  . triamcinolone cream (KENALOG) 0.1 % Apply 1 application topically 4 (four) times daily. As needed for itching 45 g 3  . trolamine salicylate (ASPERCREME) 10 % cream Apply 1 application topically daily as needed (for muscle pain).     No current facility-administered medications for  this visit.     REVIEW OF SYSTEMS: See HPI for pertinent positives and negatives.  Physical Examination  Filed Vitals:   08/16/15 1152  Temp: 96.7 F (35.9 C)  Resp: 18  Height: 5\' 9"  (1.753 m)  Weight: 155 lb (70.308 kg)  SpO2: 94%    Body mass index is 22.88 kg/(m^2).    General: A&O x 3, WDWN.  Gait: normal  Eyes: PERRLA.  Pulmonary: CTAB, without wheezes , rales or rhonchi.  Cardiac: regular rhythm, no detected murmur.   Carotid Bruits  Left  Right    Negative  Negative    Aorta is not palpable.  Radial pulses: 2+ and =   VASCULAR EXAM:  Extremities without ischemic changes  without Gangrene; without open wounds.   LE Pulses  LEFT  RIGHT   FEMORAL  2+palpable  1+palpable   POPLITEAL  not palpable  not palpable   POSTERIOR TIBIAL  1+ palpable  not palpable   DORSALIS PEDIS  ANTERIOR TIBIAL  2+ palpable  Not palpable    Abdomen: soft, NT, no palpable masses.  Skin: no rashes, no ulcers.  Musculoskeletal: no muscle wasting or atrophy.  Neurologic: A&O X 3; Appropriate Affect ; SENSATION: normal; MOTOR FUNCTION: moving all extremities equally, motor strength 5/5 throughout. Speech is fluent/normal. CN 2-12 intact.          Non-Invasive Vascular Imaging (08/16/15):   Left LE Arterial Duplex: Patent left superficial to popliteal artery stent with mild hyperplasia in the mid segment.  ABI:  R: 0.74 (0.73, 02/10/14), DP: monophasic, PT: monophasic, TBI: 0.44  L: 1.02 (1.02), DP: triphasic, PT: triphasic, TBI: 0.72   ASSESSMENT:  Ronnie Snyder is a 75 y.o. male who is s/p left iliofemoral endarterectomy 01/23/2012, left femoropopliteal atherectomy with stent 10/13/2012. He has has pain in his left lower leg with walking but his left femoral stent is patent and left ABI is normal with triphasic waveforms; therefore his left calf pain is not due to arterial insufficiency. His right ABI remains stable  indicating  evidence of moderate arterial occlusive disease with monophasic waveforms, but he is asymptomatic in his right leg.  Unfortunately he continues to smoke, but has decreased use to 1/2 ppd; he was again counseled re smoking cessation.  Fortunately he does not have DM. He takes Plavix daily. He does not take his 81 mg ASA daily, see HPI.  He is statin intolerant.    PLAN:   The patient was counseled re smoking cessation and given several free resources re smoking cessation.   Graduated walking program discussed in detail.  Based on today's exam and non-invasive vascular lab results, the patient will follow up in 1 year with the following tests: ABI's and left LE arterial duplex.  I discussed in depth with the patient the nature of atherosclerosis, and emphasized the importance of maximal medical management including strict control of blood pressure, blood glucose, and lipid levels, obtaining regular exercise, and cessation of smoking.  The patient is aware that without maximal medical management the underlying atherosclerotic disease process will progress, limiting the benefit of any interventions. . The patient was given information about stroke prevention and what symptoms should prompt the patient to seek immediate medical care.  The patient was given information about PAD including signs, symptoms, treatment, what symptoms should prompt the patient to seek immediate medical care, and risk reduction measures to take. Thank you for allowing [place] to participate in this patient's care.  Clemon Chambers, RN, MSN, FNP-C [facility] Office: [phone]  Clinic MD: Oneida Alar on call   08/16/2015 11:27 AM

## 2015-08-16 NOTE — Patient Instructions (Signed)
Peripheral Vascular Disease Peripheral vascular disease (PVD) is a disease of the blood vessels that are not part of your heart and brain. A simple term for PVD is poor circulation. In most cases, PVD narrows the blood vessels that carry blood from your heart to the rest of your body. This can result in a decreased supply of blood to your arms, legs, and internal organs, like your stomach or kidneys. However, it most often affects a person's lower legs and feet. There are two types of PVD.  Organic PVD. This is the more common type. It is caused by damage to the structure of blood vessels.  Functional PVD. This is caused by conditions that make blood vessels contract and tighten (spasm). Without treatment, PVD tends to get worse over time. PVD can also lead to acute ischemic limb. This is when an arm or limb suddenly has trouble getting enough blood. This is a medical emergency. CAUSES Each type of PVD has many different causes. The most common cause of PVD is buildup of a fatty material (plaque) inside of your arteries (atherosclerosis). Small amounts of plaque can break off from the walls of the blood vessels and become lodged in a smaller artery. This blocks blood flow and can cause acute ischemic limb. Other common causes of PVD include:  Blood clots that form inside of blood vessels.  Injuries to blood vessels.  Diseases that cause inflammation of blood vessels or cause blood vessel spasms.  Health behaviors and health history that increase your risk of developing PVD. RISK FACTORS  You may have a greater risk of PVD if you:  Have a family history of PVD.  Have certain medical conditions, including:  High cholesterol.  Diabetes.  High blood pressure (hypertension).  Coronary heart disease.  Past problems with blood clots.  Past injury, such as burns or a broken bone. These may have damaged blood vessels in your limbs.  Buerger disease. This is caused by inflamed blood  vessels in your hands and feet.  Some forms of arthritis.  Rare birth defects that affect the arteries in your legs.  Use tobacco.  Do not get enough exercise.  Are obese.  Are age 50 or older. SIGNS AND SYMPTOMS  PVD may cause many different symptoms. Your symptoms depend on what part of your body is not getting enough blood. Some common signs and symptoms include:  Cramps in your lower legs. This may be a symptom of poor leg circulation (claudication).  Pain and weakness in your legs while you are physically active that goes away when you rest (intermittent claudication).  Leg pain when at rest.  Leg numbness, tingling, or weakness.  Coldness in a leg or foot, especially when compared with the other leg.  Skin or hair changes. These can include:  Hair loss.  Shiny skin.  Pale or bluish skin.  Thick toenails.  Inability to get or maintain an erection (erectile dysfunction). People with PVD are more prone to developing ulcers and sores on their toes, feet, or legs. These may take longer than normal to heal. DIAGNOSIS Your health care provider may diagnose PVD from your signs and symptoms. The health care provider will also do a physical exam. You may have tests to find out what is causing your PVD and determine its severity. Tests may include:  Blood pressure recordings from your arms and legs and measurements of the strength of your pulses (pulse volume recordings).  Imaging studies using sound waves to take pictures of   the blood flow through your blood vessels (Doppler ultrasound).  Injecting a dye into your blood vessels before having imaging studies using:  X-rays (angiogram or arteriogram).  Computer-generated X-rays (CT angiogram).  A powerful electromagnetic field and a computer (magnetic resonance angiogram or MRA). TREATMENT Treatment for PVD depends on the cause of your condition and the severity of your symptoms. It also depends on your age. Underlying  causes need to be treated and controlled. These include long-lasting (chronic) conditions, such as diabetes, high cholesterol, and high blood pressure. You may need to first try making lifestyle changes and taking medicines. Surgery may be needed if these do not work. Lifestyle changes may include:  Quitting smoking.  Exercising regularly.  Following a low-fat, low-cholesterol diet. Medicines may include:  Blood thinners to prevent blood clots.  Medicines to improve blood flow.  Medicines to improve your blood cholesterol levels. Surgical procedures may include:  A procedure that uses an inflated balloon to open a blocked artery and improve blood flow (angioplasty).  A procedure to put in a tube (stent) to keep a blocked artery open (stent implant).  Surgery to reroute blood flow around a blocked artery (peripheral bypass surgery).  Surgery to remove dead tissue from an infected wound on the affected limb.  Amputation. This is surgical removal of the affected limb. This may be necessary in cases of acute ischemic limb that are not improved through medical or surgical treatments. HOME CARE INSTRUCTIONS  Take medicines only as directed by your health care provider.  Do not use any tobacco products, including cigarettes, chewing tobacco, or electronic cigarettes. If you need help quitting, ask your health care provider.  Lose weight if you are overweight, and maintain a healthy weight as directed by your health care provider.  Eat a diet that is low in fat and cholesterol. If you need help, ask your health care provider.  Exercise regularly. Ask your health care provider to suggest some good activities for you.  Use compression stockings or other mechanical devices as directed by your health care provider.  Take good care of your feet.  Wear comfortable shoes that fit well.  Check your feet often for any cuts or sores. SEEK MEDICAL CARE IF:  You have cramps in your legs  while walking.  You have leg pain when you are at rest.  You have coldness in a leg or foot.  Your skin changes.  You have erectile dysfunction.  You have cuts or sores on your feet that are not healing. SEEK IMMEDIATE MEDICAL CARE IF:  Your arm or leg turns cold and blue.  Your arms or legs become red, warm, swollen, painful, or numb.  You have chest pain or trouble breathing.  You suddenly have weakness in your face, arm, or leg.  You become very confused or lose the ability to speak.  You suddenly have a very bad headache or lose your vision.   This information is not intended to replace advice given to you by your health care provider. Make sure you discuss any questions you have with your health care provider.   Document Released: 06/13/2004 Document Revised: 05/27/2014 Document Reviewed: 10/14/2013 Elsevier Interactive Patient Education 2016 Elsevier Inc.     Steps to Quit Smoking  Smoking tobacco can be harmful to your health and can affect almost every organ in your body. Smoking puts you, and those around you, at risk for developing many serious chronic diseases. Quitting smoking is difficult, but it is one of   the best things that you can do for your health. It is never too late to quit. WHAT ARE THE BENEFITS OF QUITTING SMOKING? When you quit smoking, you lower your risk of developing serious diseases and conditions, such as:  Lung cancer or lung disease, such as COPD.  Heart disease.  Stroke.  Heart attack.  Infertility.  Osteoporosis and bone fractures. Additionally, symptoms such as coughing, wheezing, and shortness of breath may get better when you quit. You may also find that you get sick less often because your body is stronger at fighting off colds and infections. If you are pregnant, quitting smoking can help to reduce your chances of having a baby of low birth weight. HOW DO I GET READY TO QUIT? When you decide to quit smoking, create a plan to  make sure that you are successful. Before you quit:  Pick a date to quit. Set a date within the next two weeks to give you time to prepare.  Write down the reasons why you are quitting. Keep this list in places where you will see it often, such as on your bathroom mirror or in your car or wallet.  Identify the people, places, things, and activities that make you want to smoke (triggers) and avoid them. Make sure to take these actions:  Throw away all cigarettes at home, at work, and in your car.  Throw away smoking accessories, such as ashtrays and lighters.  Clean your car and make sure to empty the ashtray.  Clean your home, including curtains and carpets.  Tell your family, friends, and coworkers that you are quitting. Support from your loved ones can make quitting easier.  Talk with your health care provider about your options for quitting smoking.  Find out what treatment options are covered by your health insurance. WHAT STRATEGIES CAN I USE TO QUIT SMOKING?  Talk with your healthcare provider about different strategies to quit smoking. Some strategies include:  Quitting smoking altogether instead of gradually lessening how much you smoke over a period of time. Research shows that quitting "cold turkey" is more successful than gradually quitting.  Attending in-person counseling to help you build problem-solving skills. You are more likely to have success in quitting if you attend several counseling sessions. Even short sessions of 10 minutes can be effective.  Finding resources and support systems that can help you to quit smoking and remain smoke-free after you quit. These resources are most helpful when you use them often. They can include:  Online chats with a counselor.  Telephone quitlines.  Printed self-help materials.  Support groups or group counseling.  Text messaging programs.  Mobile phone applications.  Taking medicines to help you quit smoking. (If you are  pregnant or breastfeeding, talk with your health care provider first.) Some medicines contain nicotine and some do not. Both types of medicines help with cravings, but the medicines that include nicotine help to relieve withdrawal symptoms. Your health care provider may recommend:  Nicotine patches, gum, or lozenges.  Nicotine inhalers or sprays.  Non-nicotine medicine that is taken by mouth. Talk with your health care provider about combining strategies, such as taking medicines while you are also receiving in-person counseling. Using these two strategies together makes you more likely to succeed in quitting than if you used either strategy on its own. If you are pregnant or breastfeeding, talk with your health care provider about finding counseling or other support strategies to quit smoking. Do not take medicine to help you   quit smoking unless told to do so by your health care provider. WHAT THINGS CAN I DO TO MAKE IT EASIER TO QUIT? Quitting smoking might feel overwhelming at first, but there is a lot that you can do to make it easier. Take these important actions:  Reach out to your family and friends and ask that they support and encourage you during this time. Call telephone quitlines, reach out to support groups, or work with a counselor for support.  Ask people who smoke to avoid smoking around you.  Avoid places that trigger you to smoke, such as bars, parties, or smoke-break areas at work.  Spend time around people who do not smoke.  Lessen stress in your life, because stress can be a smoking trigger for some people. To lessen stress, try:  Exercising regularly.  Deep-breathing exercises.  Yoga.  Meditating.  Performing a body scan. This involves closing your eyes, scanning your body from head to toe, and noticing which parts of your body are particularly tense. Purposefully relax the muscles in those areas.  Download or purchase mobile phone or tablet apps (applications)  that can help you stick to your quit plan by providing reminders, tips, and encouragement. There are many free apps, such as QuitGuide from the CDC (Centers for Disease Control and Prevention). You can find other support for quitting smoking (smoking cessation) through smokefree.gov and other websites. HOW WILL I FEEL WHEN I QUIT SMOKING? Within the first 24 hours of quitting smoking, you may start to feel some withdrawal symptoms. These symptoms are usually most noticeable 2-3 days after quitting, but they usually do not last beyond 2-3 weeks. Changes or symptoms that you might experience include:  Mood swings.  Restlessness, anxiety, or irritation.  Difficulty concentrating.  Dizziness.  Strong cravings for sugary foods in addition to nicotine.  Mild weight gain.  Constipation.  Nausea.  Coughing or a sore throat.  Changes in how your medicines work in your body.  A depressed mood.  Difficulty sleeping (insomnia). After the first 2-3 weeks of quitting, you may start to notice more positive results, such as:  Improved sense of smell and taste.  Decreased coughing and sore throat.  Slower heart rate.  Lower blood pressure.  Clearer skin.  The ability to breathe more easily.  Fewer sick days. Quitting smoking is very challenging for most people. Do not get discouraged if you are not successful the first time. Some people need to make many attempts to quit before they achieve long-term success. Do your best to stick to your quit plan, and talk with your health care provider if you have any questions or concerns.   This information is not intended to replace advice given to you by your health care provider. Make sure you discuss any questions you have with your health care provider.   Document Released: 04/30/2001 Document Revised: 09/20/2014 Document Reviewed: 09/20/2014 Elsevier Interactive Patient Education 2016 Elsevier Inc.  

## 2015-08-17 ENCOUNTER — Encounter: Payer: Self-pay | Admitting: Family

## 2015-10-03 ENCOUNTER — Ambulatory Visit (INDEPENDENT_AMBULATORY_CARE_PROVIDER_SITE_OTHER): Payer: Medicare Other | Admitting: Endocrinology

## 2015-10-03 ENCOUNTER — Encounter: Payer: Self-pay | Admitting: Endocrinology

## 2015-10-03 VITALS — BP 132/80 | HR 69 | Temp 97.9°F | Ht 69.0 in | Wt 155.0 lb

## 2015-10-03 DIAGNOSIS — M25569 Pain in unspecified knee: Secondary | ICD-10-CM | POA: Insufficient documentation

## 2015-10-03 DIAGNOSIS — Z125 Encounter for screening for malignant neoplasm of prostate: Secondary | ICD-10-CM

## 2015-10-03 DIAGNOSIS — Z23 Encounter for immunization: Secondary | ICD-10-CM | POA: Diagnosis not present

## 2015-10-03 DIAGNOSIS — F411 Generalized anxiety disorder: Secondary | ICD-10-CM | POA: Diagnosis not present

## 2015-10-03 DIAGNOSIS — F17200 Nicotine dependence, unspecified, uncomplicated: Secondary | ICD-10-CM

## 2015-10-03 DIAGNOSIS — I779 Disorder of arteries and arterioles, unspecified: Secondary | ICD-10-CM

## 2015-10-03 DIAGNOSIS — I10 Essential (primary) hypertension: Secondary | ICD-10-CM

## 2015-10-03 DIAGNOSIS — M25562 Pain in left knee: Secondary | ICD-10-CM | POA: Diagnosis not present

## 2015-10-03 DIAGNOSIS — E1151 Type 2 diabetes mellitus with diabetic peripheral angiopathy without gangrene: Secondary | ICD-10-CM

## 2015-10-03 DIAGNOSIS — E7800 Pure hypercholesterolemia, unspecified: Secondary | ICD-10-CM | POA: Diagnosis not present

## 2015-10-03 LAB — HEPATIC FUNCTION PANEL
ALT: 17 U/L (ref 0–53)
AST: 19 U/L (ref 0–37)
Albumin: 4.5 g/dL (ref 3.5–5.2)
Alkaline Phosphatase: 69 U/L (ref 39–117)
BILIRUBIN DIRECT: 0.1 mg/dL (ref 0.0–0.3)
BILIRUBIN TOTAL: 0.6 mg/dL (ref 0.2–1.2)
Total Protein: 6.6 g/dL (ref 6.0–8.3)

## 2015-10-03 LAB — URINALYSIS, ROUTINE W REFLEX MICROSCOPIC
Bilirubin Urine: NEGATIVE
Hgb urine dipstick: NEGATIVE
KETONES UR: NEGATIVE
Leukocytes, UA: NEGATIVE
Nitrite: NEGATIVE
PH: 7 (ref 5.0–8.0)
RBC / HPF: NONE SEEN (ref 0–?)
SPECIFIC GRAVITY, URINE: 1.01 (ref 1.000–1.030)
Total Protein, Urine: NEGATIVE
Urine Glucose: NEGATIVE
Urobilinogen, UA: 1 (ref 0.0–1.0)

## 2015-10-03 LAB — CBC WITH DIFFERENTIAL/PLATELET
BASOS PCT: 0.9 % (ref 0.0–3.0)
Basophils Absolute: 0.1 10*3/uL (ref 0.0–0.1)
EOS PCT: 3.3 % (ref 0.0–5.0)
Eosinophils Absolute: 0.2 10*3/uL (ref 0.0–0.7)
HEMATOCRIT: 40.9 % (ref 39.0–52.0)
HEMOGLOBIN: 13.7 g/dL (ref 13.0–17.0)
LYMPHS PCT: 26.8 % (ref 12.0–46.0)
Lymphs Abs: 1.9 10*3/uL (ref 0.7–4.0)
MCHC: 33.6 g/dL (ref 30.0–36.0)
MCV: 85.2 fl (ref 78.0–100.0)
Monocytes Absolute: 0.6 10*3/uL (ref 0.1–1.0)
Monocytes Relative: 8.8 % (ref 3.0–12.0)
NEUTROS ABS: 4.3 10*3/uL (ref 1.4–7.7)
Neutrophils Relative %: 60.2 % (ref 43.0–77.0)
PLATELETS: 182 10*3/uL (ref 150.0–400.0)
RBC: 4.8 Mil/uL (ref 4.22–5.81)
RDW: 14.4 % (ref 11.5–15.5)
WBC: 7.2 10*3/uL (ref 4.0–10.5)

## 2015-10-03 LAB — MICROALBUMIN / CREATININE URINE RATIO
CREATININE, U: 148.7 mg/dL
Microalb Creat Ratio: 2.3 mg/g (ref 0.0–30.0)
Microalb, Ur: 3.4 mg/dL — ABNORMAL HIGH (ref 0.0–1.9)

## 2015-10-03 LAB — TSH: TSH: 3.27 u[IU]/mL (ref 0.35–4.50)

## 2015-10-03 LAB — BASIC METABOLIC PANEL
BUN: 10 mg/dL (ref 6–23)
CALCIUM: 9.8 mg/dL (ref 8.4–10.5)
CHLORIDE: 97 meq/L (ref 96–112)
CO2: 31 meq/L (ref 19–32)
Creatinine, Ser: 0.59 mg/dL (ref 0.40–1.50)
GFR: 142.75 mL/min (ref >60.00)
GLUCOSE: 108 mg/dL — AB (ref 70–99)
Potassium: 4.8 mEq/L (ref 3.5–5.1)
SODIUM: 134 meq/L — AB (ref 135–145)

## 2015-10-03 LAB — LIPID PANEL
CHOLESTEROL: 224 mg/dL — AB (ref 0–200)
HDL: 31.8 mg/dL — AB (ref >39.00)
LDL Cholesterol: 155 mg/dL — ABNORMAL HIGH (ref 0–99)
NONHDL: 192.57
Total CHOL/HDL Ratio: 7
Triglycerides: 189 mg/dL — ABNORMAL HIGH (ref 0.0–149.0)
VLDL: 37.8 mg/dL (ref 0.0–40.0)

## 2015-10-03 LAB — POCT GLYCOSYLATED HEMOGLOBIN (HGB A1C): Hemoglobin A1C: 6

## 2015-10-03 LAB — PSA, MEDICARE: PSA: 0.23 ng/mL (ref 0.10–4.00)

## 2015-10-03 NOTE — Patient Instructions (Addendum)
Please see a specialist for your leg.  you will receive a phone call, about a day and time for an appointment.   blood tests are requested for you today.  We'll let you know about the results.   good diet and exercise significantly improve your health.  please let me know if you wish to be referred to a dietician.  high blood sugar is very risky to your health.  you should see an eye doctor and dentist every year.  It is very important to get all recommended vaccinations.  please consider these measures for your health:  minimize alcohol.  do not use tobacco products.  have a colonoscopy at least every 10 years from age 52.  keep firearms safely stored.  always use seat belts.  have working smoke alarms in your home.  see an eye doctor and dentist regularly.  never drive under the influence of alcohol or drugs (including prescription drugs).  those with fair skin should take precautions against the sun.  it is critically important to prevent falling down (keep floor areas well-lit, dry, and free of loose objects.  If you have a cane, walker, or wheelchair, you should use it, even for short trips around the house.  Wear flat-soled shoes.  Also, try not to rush) Please return in 1 year.

## 2015-10-03 NOTE — Progress Notes (Signed)
we discussed code status.  pt requests full code, but would not want to be started or maintained on artificial life-support measures if there was not a reasonable chance of recovery 
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
## 2015-10-12 ENCOUNTER — Ambulatory Visit: Payer: Medicare Other | Admitting: Family Medicine

## 2015-10-19 ENCOUNTER — Encounter: Payer: Self-pay | Admitting: Family Medicine

## 2015-10-19 ENCOUNTER — Ambulatory Visit (INDEPENDENT_AMBULATORY_CARE_PROVIDER_SITE_OTHER): Payer: Medicare Other | Admitting: Family Medicine

## 2015-10-19 ENCOUNTER — Other Ambulatory Visit: Payer: Self-pay

## 2015-10-19 VITALS — BP 130/80 | HR 68 | Ht 69.0 in | Wt 156.0 lb

## 2015-10-19 DIAGNOSIS — M79662 Pain in left lower leg: Secondary | ICD-10-CM | POA: Diagnosis not present

## 2015-10-19 DIAGNOSIS — I779 Disorder of arteries and arterioles, unspecified: Secondary | ICD-10-CM | POA: Diagnosis not present

## 2015-10-19 MED ORDER — GABAPENTIN 100 MG PO CAPS: 200.0000 mg | ORAL_CAPSULE | Freq: Every day | ORAL | Status: DC

## 2015-10-19 NOTE — Progress Notes (Signed)
[name] [place], [place] [address] Phone: [phone] Subjective:       CC: Left leg pain  [id number] Ronnie Snyder is a 75 y.o. male coming in with complaint of left leg pain. Patient does have significant comorbidities including diabetes, history of abdominal aortic aneurysm, as well as deep venous thrombosis and peripheral vascular disease status post angioplasty and left iliofemoral endarterectomy. Patient has been followed for the last 4 years by vascular. Patient's repeat Dopplers an ABI have been remarkably normal. Patient continues on a blood thinner. Patient states that he initially started with a left leg pain before he was found to have the peripheral vascular disease and has surgery but states that the leg pain has never gotten better. Continues to have pain in the left calf. Can hurt him at rest or with activity. States sometimes it can give him a cramping sensation. Sometimes can wake him up at night. Still very localized. No significant radiation. Patient does have a history of plantar fasciitis and was given orthotics. Patient states since she's been wearing the orthotics a very mild improvement but still not good enough for him to do things like play golf. Patient denies any worsening numbness but does have cemented space line. Denies any weakness or foot drop. Rates the severity of pain a 7 out of 10 it is more frustrating and debilitating.     Past Medical History  Diagnosis Date  . Hx of colonic polyps   . GERD (gastroesophageal reflux disease)   . HTN (hypertension)   . Hepatic steatosis   . Abdominal aortic aneurysm (HCC)     3 cm aneurysm followed with serial ultrasound  . Hypothyroidism   . PAD (peripheral artery disease) ([place])   . High cholesterol     "at one time; it's fine now" (10/13/2012)  . H/O hiatal hernia   . Arthritis     "in my feet" (10/13/2012)  . Skin cancer     "burned them off my arm and  such" (10/13/2012)   Past Surgical History  Procedure Laterality Date  . Esophagogastroduodenoscopy    . Gated spect wall motion stress cardiolite  02/10/2002  . Nose surgery    . Hip fracture surgery Right 1990's  . Femoral endarterectomy Left 01/23/12    Left Endarterectomy  with bovie patch Angioplasy  . Inguinal hernia repair Right     "years ago" (10/13/2012)  . Tonsillectomy      "I was a chld" (10/13/2012)  . Angioplasty / stenting femoral Left 10/13/2012  . Abdominal angiogram N/A 12/04/2011    Procedure: ABDOMINAL ANGIOGRAM;  Surgeon: Sherren Mocha, MD;  Location: [facility] CATH LAB;  Service: Cardiovascular;  Laterality: N/A;  . Abdominal aortagram N/A 10/13/2012    Procedure: ABDOMINAL AORTAGRAM;  Surgeon: Serafina Mitchell, MD;  Location: [facility] CATH LAB;  Service: Cardiovascular;  Laterality: N/A;   Social History   Social History  . Marital Status: Married    Spouse Name: N/A  . Number of Children: N/A  . Years of Education: N/A   Occupational History  . retired     Therapist, music   Social History Main Topics  . Smoking status: Light Tobacco Smoker -- 1.00 packs/day for 57 years    Types: Cigarettes  . Smokeless tobacco: Never Used     Comment: pt states there is no hope for quitting  . Alcohol Use: 3.6 oz/week    6 Cans of beer per week  Comment: 10/13/2012 "drink a 6 pack/wk"  . Drug Use: No  . Sexual Activity: No   Other Topics Concern  . Not on file   Social History Narrative   Allergies  Allergen Reactions  . Vicodin [Hydrocodone-Acetaminophen] Nausea Only  . Atorvastatin Nausea Only    REACTION: sick  . Other     ALL PAINS/NARCOTIC MEDS-CANNOT TOLERATE WELL  . Pioglitazone Nausea Only  . Penicillins Rash   Family History  Problem Relation Age of Onset  . Hypertension Mother   . Hypertension Father   . Hypertension Sister   . Hypertension Sister     Past medical history, social, surgical and family history all reviewed in electronic medical record.  No  pertanent information unless stated regarding to the chief complaint.   Review of Systems: No headache, visual changes, nausea, vomiting, diarrhea, constipation, dizziness, abdominal pain, skin rash, fevers, chills, night sweats, weight loss, swollen lymph nodes, body aches, joint swelling, muscle aches, chest pain, shortness of breath, mood changes.   Objective Blood pressure 130/80, pulse 68, weight 156 lb (70.761 kg), SpO2 90 %.  General: No apparent distress alert and oriented x3 mood and affect normal, dressed appropriately.  HEENT: Pupils equal, extraocular movements intact  Respiratory: Patient's speak in full sentences and does not appear short of breath  Cardiovascular: No lower extremity edema, non tender, no erythema  Skin: Warm dry intact with no signs of infection or rash on extremities or on axial skeleton.  Abdomen: Soft nontender  Neuro: Cranial nerves II through XII are intact, neurovascularly intact in all extremities with 2+ DTRs and 1+ pulse on left leg compared to 2+ on the right leg of the dorsalis pedis..  Lymph: No lymphadenopathy of posterior or anterior cervical chain or axillae bilaterally.  Gait normal with good balance and coordination.  MSK:  Non tender with full range of motion and good stability and symmetric strength and tone of shoulders, elbows, wrist, hip, knee and ankles bilaterally. Mild arthritic changes of multiple joints. Patient does have some scoliosis of the upper thoracic spine Left gastrocnemius is in very mild atrophy compared to the contralateral side and does have some loss of hair. Patient is tender to palpation right between the medial and lateral gastrocnemius heads distally. No palpable mass felt. Neurovascularly intact distally with 2+1 dorsalis pedis compared to 2+ on the contralateral side. Good capillary refill of the toes. Good range of motion of the ankle but does lack the last 10 of dorsiflexion.  Limited Musculoskeletal ultrasound was  performed and interpreted by Hulan Saas, M   Limited ultrasound patient's left calf does not show any true muscle injury. Patient does have some thickening with increasing Doppler flow of the sural nerve. All veins visualized today seem to be compressible at this time. Patient discussed the meniscus muscle may have some fat atrophy. Impression: Nonspecific thickening of the sural nerve and mild atrophy of the gastrocnemius muscle.   Impression and Recommendations:     This case required medical decision making of moderate complexity.      Note: This dictation was prepared with Dragon dictation along with smaller phrase technology. Any transcriptional errors that result from this process are unintentional.

## 2015-10-19 NOTE — Assessment & Plan Note (Signed)
Difficult to assess overall. I do not see any specific musculoskeletal injury at this time and I do have to think that the pain is likely secondary to more of a neovascular pathology. Discussed with patient in great length. Patient has difficulty with blood pressure control's we will hold on pletal for now but this could be another treatment option. Patient will be put on gabapentin in case that this is more of a neurologic compromise secondary to patient's diabetes as well as postinflammatory changes. We did discuss possible compression sleeve and a heel lift to help out gastrocnemius architecture and help it from fatigue. Patient wants to avoid any exercises on a regular basis he states. Patient try to make these changes and come back and see me again in 4 weeks.

## 2015-10-19 NOTE — Progress Notes (Signed)
Pre visit review using our clinic review tool, if applicable. No additional management support is needed unless otherwise documented below in the visit note. 

## 2015-10-19 NOTE — Patient Instructions (Addendum)
Good to see you  Heel lift in your shoe to help let the calf relax Also wear a compression sleeve on the calf.  Gabapentin 100mg  at night for the next night and then 200mg  at night  This will take some time to heal but hopefully we will see some improvement If not better we may be able to try another medicine to help the blood flow.  See me again in 4 weeks

## 2015-11-02 ENCOUNTER — Other Ambulatory Visit: Payer: Self-pay | Admitting: Endocrinology

## 2015-11-09 ENCOUNTER — Encounter: Payer: Self-pay | Admitting: Family Medicine

## 2015-11-09 ENCOUNTER — Ambulatory Visit (INDEPENDENT_AMBULATORY_CARE_PROVIDER_SITE_OTHER): Payer: Medicare Other | Admitting: Family Medicine

## 2015-11-09 VITALS — BP 120/72 | HR 73 | Ht 69.0 in | Wt 156.0 lb

## 2015-11-09 DIAGNOSIS — I739 Peripheral vascular disease, unspecified: Secondary | ICD-10-CM | POA: Diagnosis not present

## 2015-11-09 DIAGNOSIS — M79662 Pain in left lower leg: Secondary | ICD-10-CM

## 2015-11-09 DIAGNOSIS — I779 Disorder of arteries and arterioles, unspecified: Secondary | ICD-10-CM | POA: Diagnosis not present

## 2015-11-09 MED ORDER — CILOSTAZOL 50 MG PO TABS: 25.0000 mg | ORAL_TABLET | Freq: Two times a day (BID) | ORAL | Status: DC

## 2015-11-09 NOTE — Patient Instructions (Signed)
Good to see you  I am sorry we do not have it figured out yet.  Continue the compression  Look at happ[employer].com for a heel lift Pletal 1/2 tab up to 2 times daily to help with blood flow to the legs.  Be careful first day to make sure it does not make you lightheaded.  Continue the gabapentin 100mg  at night and then can consider going up to 200mg  if you want Exercises 3 times a week.  See me again 4 weeks and if elbow still swollen we will drain as well.

## 2015-11-09 NOTE — Assessment & Plan Note (Signed)
I do believe that this and multifactorial. I do think neurovascular compromise and atrophy of the musculature is likely contributing. I do believe that we are not fully treated the vascular component this point and will be put on a low dose Pletal. Discuss we want to start with a half tabs secondary to patient having difficulty with medications previously. We'll see how patient response. Patient may be able to go up to a full tab twice a day if necessary. I do want him to continue on the gabapentin with it helping some of his other chronic discomfort. Patient was shown home exercises. Decline formal physical therapy for muscle strengthening the ankle be beneficial. We discussed diet changes. Follow-up again in 4 weeks.  Spent  25 minutes with patient face-to-face and had greater than 50% of counseling including as described above in assessment and plan.

## 2015-11-09 NOTE — Assessment & Plan Note (Signed)
Patient states he recently did have an ABI done. I do not have these reports. Patient will see if he can possibly get him for [place]. We discussed possible ABI is necessary. Patient once to avoid that at this time. Patient knows if worsening symptoms any see his vascular surgeon immediately.

## 2015-11-09 NOTE — Progress Notes (Signed)
[name] [place], [place] [address] Phone: [phone] Subjective:       CC: Left leg pain Follow-up  [id number] Ronnie Snyder is a 75 y.o. male coming in with complaint of left leg pain. Patient does have significant comorbidities including diabetes, history of abdominal aortic aneurysm, as well as deep venous thrombosis and peripheral vascular disease status post angioplasty and left iliofemoral endarterectomy. Patient has been followed for the last 4 years by vascular. Patient's repeat Dopplers an ABI have been remarkably normal. Patient continues on a blood thinner.  Patient saw me previously and was having more of a cramping in the left calf area. Likely secondary to more of atrophy secondary to the poor blood flow in the sling for quite some time. There was a possibility for that be more in neurologic. Patient was started on gabapentin. States a lot of his aches and pains and the rest of his body has improved significantly but unfortunately continues to have pain in the calf. Still more of a cramping sensation that seems to be worse with activity. Denies any swelling. Affects different things regular basis. Patient states that he still has to limit some of his activity secondary to the discomfort. No nighttime awakenings like he was having previously.     Past Medical History  Diagnosis Date  . Hx of colonic polyps   . GERD (gastroesophageal reflux disease)   . HTN (hypertension)   . Hepatic steatosis   . Abdominal aortic aneurysm (HCC)     3 cm aneurysm followed with serial ultrasound  . Hypothyroidism   . PAD (peripheral artery disease) ([place])   . High cholesterol     "at one time; it's fine now" (10/13/2012)  . H/O hiatal hernia   . Arthritis     "in my feet" (10/13/2012)  . Skin cancer     "burned them off my arm and such" (10/13/2012)   Past Surgical History  Procedure Laterality Date  . Esophagogastroduodenoscopy      . Gated spect wall motion stress cardiolite  02/10/2002  . Nose surgery    . Hip fracture surgery Right 1990's  . Femoral endarterectomy Left 01/23/12    Left Endarterectomy  with bovie patch Angioplasy  . Inguinal hernia repair Right     "years ago" (10/13/2012)  . Tonsillectomy      "I was a chld" (10/13/2012)  . Angioplasty / stenting femoral Left 10/13/2012  . Abdominal angiogram N/A 12/04/2011    Procedure: ABDOMINAL ANGIOGRAM;  Surgeon: Sherren Mocha, MD;  Location: [facility] CATH LAB;  Service: Cardiovascular;  Laterality: N/A;  . Abdominal aortagram N/A 10/13/2012    Procedure: ABDOMINAL AORTAGRAM;  Surgeon: Serafina Mitchell, MD;  Location: [facility] CATH LAB;  Service: Cardiovascular;  Laterality: N/A;   Social History   Social History  . Marital Status: Married    Spouse Name: N/A  . Number of Children: N/A  . Years of Education: N/A   Occupational History  . retired     Therapist, music   Social History Main Topics  . Smoking status: Light Tobacco Smoker -- 1.00 packs/day for 57 years    Types: Cigarettes  . Smokeless tobacco: Never Used     Comment: pt states there is no hope for quitting  . Alcohol Use: 3.6 oz/week    6 Cans of beer per week     Comment: 10/13/2012 "drink a 6 pack/wk"  . Drug Use: No  . Sexual Activity:  No   Other Topics Concern  . None   Social History Narrative   Allergies  Allergen Reactions  . Vicodin [Hydrocodone-Acetaminophen] Nausea Only  . Atorvastatin Nausea Only    REACTION: sick  . Other     ALL PAINS/NARCOTIC MEDS-CANNOT TOLERATE WELL  . Pioglitazone Nausea Only  . Penicillins Rash   Family History  Problem Relation Age of Onset  . Hypertension Mother   . Hypertension Father   . Hypertension Sister   . Hypertension Sister     Past medical history, social, surgical and family history all reviewed in electronic medical record.  No pertanent information unless stated regarding to the chief complaint.   Review of Systems: No headache, visual  changes, nausea, vomiting, diarrhea, constipation, dizziness, abdominal pain, skin rash, fevers, chills, night sweats, weight loss, swollen lymph nodes, body aches, joint swelling, muscle aches, chest pain, shortness of breath, mood changes.   Objective Blood pressure 120/72, pulse 73, height 5\' 9"  (1.753 m), weight 156 lb (70.761 kg), SpO2 94 %.  General: No apparent distress alert and oriented x3 mood and affect normal, dressed appropriately.  HEENT: Pupils equal, extraocular movements intact  Respiratory: Patient's speak in full sentences and does not appear short of breath  Cardiovascular: No lower extremity edema, non tender, no erythema  Skin: Warm dry intact with no signs of infection or rash on extremities or on axial skeleton.  Abdomen: Soft nontender  Neuro: Cranial nerves II through XII are intact, neurovascularly intact in all extremities with 2+ DTRs and 1+ pulse on left leg compared to 2+ on the right leg of the dorsalis pedis..  Lymph: No lymphadenopathy of posterior or anterior cervical chain or axillae bilaterally.  Gait normal with good balance and coordination.  MSK:  Non tender with full range of motion and good stability and symmetric strength and tone of shoulders, elbows, wrist, hip, knee and ankles bilaterally. Mild arthritic changes of multiple joints. Patient does have some scoliosis of the upper thoracic spinePatient does have swelling of the olecranon bursitis on the right side Left gastrocnemius is in very mild atrophy compared to the contralateral side and does have some loss of hair. Continues tenderness noted on the less than previous exam.. No palpable mass felt. Neurovascularly intact distally with 2+1 dorsalis pedis compared to 2+ on the contralateral side. Good capillary refill of the toes. Good range of motion of the ankle but does lack the last 10 of dorsiflexion.  Procedure note [id number]; 15 minutes spent for Therapeutic exercises as stated in above notes.  This  included exercises focusing on stretching, strengthening, with significant focus on eccentric aspects.  Ankle strengthening that included:  Basic range of motion exercises to allow proper full motion at ankle Stretching of the lower leg and hamstrings  Theraband exercises for the lower leg - inversion, eversion, dorsiflexion and plantarflexion each to be completed with a theraband Balance exercises to increase proprioception Weight bearing exercises to increase strength and balance  Proper technique shown and discussed handout in great detail with ATC.  All questions were discussed and answered.       Impression and Recommendations:     This case required medical decision making of moderate complexity.      Note: This dictation was prepared with Dragon dictation along with smaller phrase technology. Any transcriptional errors that result from this process are unintentional.

## 2015-11-09 NOTE — Progress Notes (Signed)
Pre visit review using our clinic review tool, if applicable. No additional management support is needed unless otherwise documented below in the visit note. 

## 2015-11-13 ENCOUNTER — Telehealth: Payer: Self-pay | Admitting: *Deleted

## 2015-11-13 DIAGNOSIS — L821 Other seborrheic keratosis: Secondary | ICD-10-CM | POA: Diagnosis not present

## 2015-11-13 DIAGNOSIS — L570 Actinic keratosis: Secondary | ICD-10-CM | POA: Diagnosis not present

## 2015-11-13 DIAGNOSIS — L219 Seborrheic dermatitis, unspecified: Secondary | ICD-10-CM | POA: Diagnosis not present

## 2015-11-13 NOTE — Telephone Encounter (Signed)
Prior auth initiated 6.26.17 for cilostazol 50mg  tablet.

## 2015-12-07 ENCOUNTER — Ambulatory Visit: Payer: Medicare Other | Admitting: Family Medicine

## 2015-12-07 ENCOUNTER — Encounter: Payer: Self-pay | Admitting: Family Medicine

## 2015-12-07 ENCOUNTER — Ambulatory Visit (INDEPENDENT_AMBULATORY_CARE_PROVIDER_SITE_OTHER): Payer: Medicare Other | Admitting: Family Medicine

## 2015-12-07 VITALS — BP 134/80 | HR 72 | Wt 157.0 lb

## 2015-12-07 DIAGNOSIS — I779 Disorder of arteries and arterioles, unspecified: Secondary | ICD-10-CM | POA: Diagnosis not present

## 2015-12-07 DIAGNOSIS — I70212 Atherosclerosis of native arteries of extremities with intermittent claudication, left leg: Secondary | ICD-10-CM

## 2015-12-07 NOTE — Progress Notes (Signed)
[name] [place], [place] [address] Phone: [phone] Subjective:       CC: Left leg pain Follow-up  [id number] Ronnie Snyder is a 75 y.o. male coming in with complaint of left leg pain. Patient does have significant comorbidities including diabetes, history of abdominal aortic aneurysm, as well as deep venous thrombosis and peripheral vascular disease status post angioplasty and left iliofemoral endarterectomy. Patient has been followed for the last 4 years by vascular. Patient's repeat Dopplers an ABI have been remarkably normal. Patient continues on a blood thinner.  Patient is having worsening pain seemed to be more claudication. Patient was given home exercises, Pletal gabapentin. States that the gabapentin was helpful but unfortunate he was unable to tolerate it. Same with the Pletal. Patient can do the exercises because it causes worsening pain. Patient states even walking up or down a hill can, significant amount cramping in the leg. Patient states it still seems to be mostly in the calf but can radiate all the way up to his groin area. Denies though any joint pain. Feels somewhat like it was prior to his surgery for his vessels.     Past Medical History  Diagnosis Date  . Hx of colonic polyps   . GERD (gastroesophageal reflux disease)   . HTN (hypertension)   . Hepatic steatosis   . Abdominal aortic aneurysm (HCC)     3 cm aneurysm followed with serial ultrasound  . Hypothyroidism   . PAD (peripheral artery disease) ([place])   . High cholesterol     "at one time; it's fine now" (10/13/2012)  . H/O hiatal hernia   . Arthritis     "in my feet" (10/13/2012)  . Skin cancer     "burned them off my arm and such" (10/13/2012)   Past Surgical History  Procedure Laterality Date  . Esophagogastroduodenoscopy    . Gated spect wall motion stress cardiolite  02/10/2002  . Nose surgery    . Hip fracture surgery Right 1990's  .  Femoral endarterectomy Left 01/23/12    Left Endarterectomy  with bovie patch Angioplasy  . Inguinal hernia repair Right     "years ago" (10/13/2012)  . Tonsillectomy      "I was a chld" (10/13/2012)  . Angioplasty / stenting femoral Left 10/13/2012  . Abdominal angiogram N/A 12/04/2011    Procedure: ABDOMINAL ANGIOGRAM;  Surgeon: Sherren Mocha, MD;  Location: [facility] CATH LAB;  Service: Cardiovascular;  Laterality: N/A;  . Abdominal aortagram N/A 10/13/2012    Procedure: ABDOMINAL AORTAGRAM;  Surgeon: Serafina Mitchell, MD;  Location: [facility] CATH LAB;  Service: Cardiovascular;  Laterality: N/A;   Social History   Social History  . Marital Status: Married    Spouse Name: N/A  . Number of Children: N/A  . Years of Education: N/A   Occupational History  . retired     Therapist, music   Social History Main Topics  . Smoking status: Light Tobacco Smoker -- 1.00 packs/day for 57 years    Types: Cigarettes  . Smokeless tobacco: Never Used     Comment: pt states there is no hope for quitting  . Alcohol Use: 3.6 oz/week    6 Cans of beer per week     Comment: 10/13/2012 "drink a 6 pack/wk"  . Drug Use: No  . Sexual Activity: No   Other Topics Concern  . None   Social History Narrative   Allergies  Allergen Reactions  .  Vicodin [Hydrocodone-Acetaminophen] Nausea Only  . Atorvastatin Nausea Only    REACTION: sick  . Other     ALL PAINS/NARCOTIC MEDS-CANNOT TOLERATE WELL  . Pioglitazone Nausea Only  . Penicillins Rash   Family History  Problem Relation Age of Onset  . Hypertension Mother   . Hypertension Father   . Hypertension Sister   . Hypertension Sister     Past medical history, social, surgical and family history all reviewed in electronic medical record.  No pertanent information unless stated regarding to the chief complaint.   Review of Systems: No headache, visual changes, nausea, vomiting, diarrhea, constipation, dizziness, abdominal pain, skin rash, fevers, chills, night sweats,  weight loss, swollen lymph nodes, body aches, joint swelling, muscle aches, chest pain, shortness of breath, mood changes.   Objective Blood pressure 134/80, pulse 72, weight 157 lb (71.215 kg).  General: No apparent distress alert and oriented x3 mood and affect normal, dressed appropriately.  HEENT: Pupils equal, extraocular movements intact  Respiratory: Patient's speak in full sentences and does not appear short of breath  Cardiovascular: No lower extremity edema, non tender, no erythema  Skin: Warm dry intact with no signs of infection or rash on extremities or on axial skeleton.  Abdomen: Soft nontender  Neuro: Cranial nerves II through XII are intact, neurovascularly intact in all extremities with 2+ DTRs and 1+ pulse on left leg compared to 2+ on the right leg of the dorsalis pedis..  Lymph: No lymphadenopathy of posterior or anterior cervical chain or axillae bilaterally.  Gait normal with good balance and coordination.  MSK:  Non tender with full range of motion and good stability and symmetric strength and tone of shoulders, elbows, wrist, hip, knee and ankles bilaterally. Mild arthritic changes of multiple joints. Patient does have some scoliosis of the upper thoracic spine.  swelling of the olecranon is improved on the right side Left gastrocnemius is in very mild atrophy compared to the contralateral side and does have some loss of hair. Continues tenderness an more than previous exam No palpable mass felt. Neurovascularly intact distally with 2+1 dorsalis pedis compared to 2+ on the contralateral side. For second refill of capillary in the toes. Good range of motion of the ankle but does lack the last 10 of dorsiflexion. Negative straight leg test of the back.     Impression and Recommendations:     This case required medical decision making of moderate complexity.      Note: This dictation was prepared with Dragon dictation along with smaller phrase technology. Any  transcriptional errors that result from this process are unintentional.

## 2015-12-07 NOTE — Patient Instructions (Signed)
Please follow up with vascular.  We did try but unfortantely it did not help and do not want to continue to throw medications at it.  I am here if you have questions.

## 2015-12-07 NOTE — Assessment & Plan Note (Signed)
I believe the patient is having worsening claudication syndrome of the lower extremity. Patient is unable to tolerate the gabapentin or the Pletal. Patient is artery on the Plavix. I do not believe that there is any type of clot. Encourage him to follow-up with vascular for further evaluation and treatment. Likely will need intervention again. Patient knows if any worsening symptoms to seek medical attention medially. Patient can call if he has any significant questions.

## 2016-01-05 ENCOUNTER — Other Ambulatory Visit: Payer: Self-pay | Admitting: Endocrinology

## 2016-02-04 ENCOUNTER — Other Ambulatory Visit: Payer: Self-pay | Admitting: Surgery

## 2016-04-08 ENCOUNTER — Encounter: Payer: Self-pay | Admitting: Endocrinology

## 2016-04-09 ENCOUNTER — Other Ambulatory Visit: Payer: Self-pay | Admitting: Endocrinology

## 2016-04-09 MED ORDER — ALPRAZOLAM 0.25 MG PO TABS: 0.2500 mg | ORAL_TABLET | Freq: Every evening | ORAL | 0 refills | Status: DC | PRN

## 2016-05-04 ENCOUNTER — Other Ambulatory Visit: Payer: Self-pay | Admitting: Endocrinology

## 2016-08-02 ENCOUNTER — Other Ambulatory Visit: Payer: Self-pay | Admitting: Endocrinology

## 2016-08-06 ENCOUNTER — Other Ambulatory Visit: Payer: Self-pay | Admitting: Endocrinology

## 2016-08-07 ENCOUNTER — Other Ambulatory Visit: Payer: Self-pay | Admitting: Endocrinology

## 2016-08-08 ENCOUNTER — Other Ambulatory Visit: Payer: Self-pay | Admitting: Vascular Surgery

## 2016-08-19 ENCOUNTER — Other Ambulatory Visit (HOSPITAL_COMMUNITY): Payer: Medicare Other

## 2016-08-19 ENCOUNTER — Ambulatory Visit: Payer: Medicare Other | Admitting: Family

## 2016-08-19 ENCOUNTER — Encounter (HOSPITAL_COMMUNITY): Payer: Medicare Other

## 2016-09-23 ENCOUNTER — Other Ambulatory Visit: Payer: Self-pay

## 2016-09-23 ENCOUNTER — Ambulatory Visit (INDEPENDENT_AMBULATORY_CARE_PROVIDER_SITE_OTHER): Payer: Medicare Other | Admitting: Endocrinology

## 2016-09-23 ENCOUNTER — Ambulatory Visit
Admission: RE | Admit: 2016-09-23 | Discharge: 2016-09-23 | Disposition: A | Discharge status: Home / Self Care | Payer: Medicare Other | Source: Ambulatory Visit | Attending: Endocrinology | Admitting: Endocrinology

## 2016-09-23 ENCOUNTER — Encounter: Payer: Self-pay | Admitting: Endocrinology

## 2016-09-23 VITALS — BP 136/70 | HR 72 | Ht 69.0 in | Wt 147.0 lb

## 2016-09-23 DIAGNOSIS — M545 Low back pain, unspecified: Secondary | ICD-10-CM

## 2016-09-23 DIAGNOSIS — M47817 Spondylosis without myelopathy or radiculopathy, lumbosacral region: Secondary | ICD-10-CM | POA: Diagnosis not present

## 2016-09-23 LAB — URINALYSIS, ROUTINE W REFLEX MICROSCOPIC
BILIRUBIN URINE: NEGATIVE
HGB URINE DIPSTICK: NEGATIVE
KETONES UR: NEGATIVE
Leukocytes, UA: NEGATIVE
NITRITE: NEGATIVE
RBC / HPF: NONE SEEN (ref 0–?)
Specific Gravity, Urine: <=1.005 — AB (ref 1.000–1.030)
TOTAL PROTEIN, URINE-UPE24: NEGATIVE
URINE GLUCOSE: NEGATIVE
Urobilinogen, UA: 0.2 (ref 0.0–1.0)
WBC, UA: NONE SEEN (ref 0–?)
pH: 7 (ref 5.0–8.0)

## 2016-09-23 MED ORDER — METHYLPREDNISOLONE 4 MG PO TBPK: ORAL_TABLET | ORAL | 0 refills | Status: DC

## 2016-09-23 NOTE — Patient Instructions (Addendum)
A urine test and x-rays are requested for you today.  We'll let you know about the results.  I have sent a prescription to your pharmacy, for a steroid "pack."  I'll see you soon.        Back Pain, Adult Back pain is very common in adults.The cause of back pain is rarely dangerous and the pain often gets better over time.The cause of your back pain may not be known. Some common causes of back pain include:  Strain of the muscles or ligaments supporting the spine.  Wear and tear (degeneration) of the spinal disks.  Arthritis.  Direct injury to the back. For many people, back pain may return. Since back pain is rarely dangerous, most people can learn to manage this condition on their own. Follow these instructions at home: Watch your back pain for any changes. The following actions may help to lessen any discomfort you are feeling:  Remain active. It is stressful on your back to sit or stand in one place for long periods of time. Do not sit, drive, or stand in one place for more than 30 minutes at a time. Take short walks on even surfaces as soon as you are able.Try to increase the length of time you walk each day.  Exercise regularly as directed by your health care provider. Exercise helps your back heal faster. It also helps avoid future injury by keeping your muscles strong and flexible.  Do not stay in bed.Resting more than 1-2 days can delay your recovery.  Pay attention to your body when you bend and lift. The most comfortable positions are those that put less stress on your recovering back. Always use proper lifting techniques, including:  Bending your knees.  Keeping the load close to your body.  Avoiding twisting.  Find a comfortable position to sleep. Use a firm mattress and lie on your side with your knees slightly bent. If you lie on your back, put a pillow under your knees.  Avoid feeling anxious or stressed.Stress increases muscle tension and can worsen back  pain.It is important to recognize when you are anxious or stressed and learn ways to manage it, such as with exercise.  Take medicines only as directed by your health care provider. Over-the-counter medicines to reduce pain and inflammation are often the most helpful.Your health care provider may prescribe muscle relaxant drugs.These medicines help dull your pain so you can more quickly return to your normal activities and healthy exercise.  Apply ice to the injured area:  Put ice in a plastic bag.  Place a towel between your skin and the bag.  Leave the ice on for 20 minutes, 2-3 times a day for the first 2-3 days. After that, ice and heat may be alternated to reduce pain and spasms.  Maintain a healthy weight. Excess weight puts extra stress on your back and makes it difficult to maintain good posture. Contact a health care provider if:  You have pain that is not relieved with rest or medicine.  You have increasing pain going down into the legs or buttocks.  You have pain that does not improve in one week.  You have night pain.  You lose weight.  You have a fever or chills. Get help right away if:  You develop new bowel or bladder control problems.  You have unusual weakness or numbness in your arms or legs.  You develop nausea or vomiting.  You develop abdominal pain.  You feel faint. This information  is not intended to replace advice given to you by your health care provider. Make sure you discuss any questions you have with your health care provider. Document Released: 05/06/2005 Document Revised: 09/14/2015 Document Reviewed: 09/07/2013 Elsevier Interactive Patient Education  2017 [employer].

## 2016-09-23 NOTE — Progress Notes (Signed)
Subjective:    Patient ID: Ronnie Snyder, male    DOB: [birth year], 76 y.o.   MRN: [id number]  HPI Pt states 1 week of moderate pain at the left lower back, with radiation to the lat aspect of the left thigh.  No assoc numbness.  Past Medical History:  Diagnosis Date  . Abdominal aortic aneurysm (HCC)    3 cm aneurysm followed with serial ultrasound  . Arthritis    "in my feet" (10/13/2012)  . GERD (gastroesophageal reflux disease)   . H/O hiatal hernia   . Hepatic steatosis   . High cholesterol    "at one time; it's fine now" (10/13/2012)  . HTN (hypertension)   . Hx of colonic polyps   . Hypothyroidism   . PAD (peripheral artery disease) ([place])   . Skin cancer    "burned them off my arm and such" (10/13/2012)    Past Surgical History:  Procedure Laterality Date  . ABDOMINAL ANGIOGRAM N/A 12/04/2011   Procedure: ABDOMINAL ANGIOGRAM;  Surgeon: Sherren Mocha, MD;  Location: [facility] CATH LAB;  Service: Cardiovascular;  Laterality: N/A;  . ABDOMINAL AORTAGRAM N/A 10/13/2012   Procedure: ABDOMINAL Maxcine Ham;  Surgeon: Serafina Mitchell, MD;  Location: [facility] C[facility] LAB;  Service: Cardiovascular;  Laterality: N/A;  . ANGIOPLASTY / STENTING FEMORAL Left 10/13/2012  . ESOPHAGOGASTRODUODENOSCOPY    . FEMORAL ENDARTERECTOMY Left 01/23/12   Left Endarterectomy  with bovie patch Angioplasy  . gated spect wall motion stress cardiolite  02/10/2002  . HIP FRACTURE SURGERY Right 1990's  . INGUINAL HERNIA REPAIR Right    "years ago" (10/13/2012)  . NOSE SURGERY    . TONSILLECTOMY     "I was a chld" (10/13/2012)    Social History   Social History  . Marital status: Married    Spouse name: N/A  . Number of children: N/A  . Years of education: N/A   Occupational History  . retired     Therapist, music   Social History Main Topics  . Smoking status: Light Tobacco Smoker    Packs/day: 1.00    Years: 57.00    Types: Cigarettes  . Smokeless tobacco: Never Used     Comment: pt states there is no hope  for quitting  . Alcohol use 3.6 oz/week    6 Cans of beer per week     Comment: 10/13/2012 "drink a 6 pack/wk"  . Drug use: No  . Sexual activity: No   Other Topics Concern  . Not on file   Social History Narrative  . No narrative on file    Current Outpatient Prescriptions on File Prior to Visit  Medication Sig Dispense Refill  . ALPRAZolam (XANAX) 0.25 MG tablet take 1 tablet by mouth at bedtime if needed 30 tablet 0  . amLODipine (NORVASC) 5 MG tablet TAKE 1 TABLET EVERY DAY 90 tablet 3  . aspirin 81 MG tablet Take 81 mg by mouth every other day.     . clopidogrel (PLAVIX) 75 MG tablet take 1 tablet by mouth once daily 30 tablet 6  . lansoprazole (PREVACID) 30 MG capsule Take 30 mg by mouth every other day.    . levothyroxine (SYNTHROID, LEVOTHROID) 75 MCG tablet TAKE 1 TABLET DAILY BEFORE BREAKFAST. 90 tablet 3  . losartan-hydrochlorothiazide (HYZAAR) 50-12.5 MG tablet take 1 tablet by mouth once daily 120 tablet 0   No current facility-administered medications on file prior to visit.     Allergies  Allergen Reactions  . Vicodin [Hydrocodone-Acetaminophen]  Nausea Only  . Atorvastatin Nausea Only    REACTION: sick  . Other     ALL PAINS/NARCOTIC MEDS-CANNOT TOLERATE WELL  . Pioglitazone Nausea Only  . Penicillins Rash    Family History  Problem Relation Age of Onset  . Hypertension Mother   . Hypertension Father   . Hypertension Sister   . Hypertension Sister     BP 136/70   Pulse 72   Ht 5\' 9"  (1.753 m)   Wt 147 lb (66.7 kg)   SpO2 95%   BMI 21.71 kg/m    Review of Systems Denies bowel or bladder retention.      Objective:   Physical Exam VITAL SIGNS:  See vs page.  GENERAL: no distress   Spine: nontender. Neuro: sensation is intact to touch on the LE's.  Gait: normal and steady.      Assessment & Plan:  Low back pain, worse.    Patient Instructions  A urine test and x-rays are requested for you today.  We'll let you know about the results.    I have sent a prescription to your pharmacy, for a steroid "pack."  I'll see you soon.        Back Pain, Adult Back pain is very common in adults.The cause of back pain is rarely dangerous and the pain often gets better over time.The cause of your back pain may not be known. Some common causes of back pain include:  Strain of the muscles or ligaments supporting the spine.  Wear and tear (degeneration) of the spinal disks.  Arthritis.  Direct injury to the back. For many people, back pain may return. Since back pain is rarely dangerous, most people can learn to manage this condition on their own. Follow these instructions at home: Watch your back pain for any changes. The following actions may help to lessen any discomfort you are feeling:  Remain active. It is stressful on your back to sit or stand in one place for long periods of time. Do not sit, drive, or stand in one place for more than 30 minutes at a time. Take short walks on even surfaces as soon as you are able.Try to increase the length of time you walk each day.  Exercise regularly as directed by your health care provider. Exercise helps your back heal faster. It also helps avoid future injury by keeping your muscles strong and flexible.  Do not stay in bed.Resting more than 1-2 days can delay your recovery.  Pay attention to your body when you bend and lift. The most comfortable positions are those that put less stress on your recovering back. Always use proper lifting techniques, including:  Bending your knees.  Keeping the load close to your body.  Avoiding twisting.  Find a comfortable position to sleep. Use a firm mattress and lie on your side with your knees slightly bent. If you lie on your back, put a pillow under your knees.  Avoid feeling anxious or stressed.Stress increases muscle tension and can worsen back pain.It is important to recognize when you are anxious or stressed and learn ways to manage  it, such as with exercise.  Take medicines only as directed by your health care provider. Over-the-counter medicines to reduce pain and inflammation are often the most helpful.Your health care provider may prescribe muscle relaxant drugs.These medicines help dull your pain so you can more quickly return to your normal activities and healthy exercise.  Apply ice to the injured area:  Put  ice in a plastic bag.  Place a towel between your skin and the bag.  Leave the ice on for 20 minutes, 2-3 times a day for the first 2-3 days. After that, ice and heat may be alternated to reduce pain and spasms.  Maintain a healthy weight. Excess weight puts extra stress on your back and makes it difficult to maintain good posture. Contact a health care provider if:  You have pain that is not relieved with rest or medicine.  You have increasing pain going down into the legs or buttocks.  You have pain that does not improve in one week.  You have night pain.  You lose weight.  You have a fever or chills. Get help right away if:  You develop new bowel or bladder control problems.  You have unusual weakness or numbness in your arms or legs.  You develop nausea or vomiting.  You develop abdominal pain.  You feel faint. This information is not intended to replace advice given to you by your health care provider. Make sure you discuss any questions you have with your health care provider. Document Released: 05/06/2005 Document Revised: 09/14/2015 Document Reviewed: 09/07/2013 Elsevier Interactive Patient Education  2017 [employer].

## 2016-10-02 ENCOUNTER — Other Ambulatory Visit (HOSPITAL_COMMUNITY): Payer: Medicare Other

## 2016-10-02 ENCOUNTER — Ambulatory Visit (INDEPENDENT_AMBULATORY_CARE_PROVIDER_SITE_OTHER): Payer: Medicare Other | Admitting: Endocrinology

## 2016-10-02 ENCOUNTER — Encounter (HOSPITAL_COMMUNITY): Payer: Medicare Other

## 2016-10-02 ENCOUNTER — Ambulatory Visit: Payer: Medicare Other | Admitting: Family

## 2016-10-02 ENCOUNTER — Encounter: Payer: Self-pay | Admitting: Endocrinology

## 2016-10-02 VITALS — BP 122/76 | HR 77 | Ht 69.0 in | Wt 142.0 lb

## 2016-10-02 DIAGNOSIS — Z125 Encounter for screening for malignant neoplasm of prostate: Secondary | ICD-10-CM | POA: Diagnosis not present

## 2016-10-02 DIAGNOSIS — Z86010 Personal history of colonic polyps: Secondary | ICD-10-CM | POA: Diagnosis not present

## 2016-10-02 DIAGNOSIS — M545 Low back pain, unspecified: Secondary | ICD-10-CM

## 2016-10-02 DIAGNOSIS — E1151 Type 2 diabetes mellitus with diabetic peripheral angiopathy without gangrene: Secondary | ICD-10-CM

## 2016-10-02 DIAGNOSIS — D649 Anemia, unspecified: Secondary | ICD-10-CM | POA: Diagnosis not present

## 2016-10-02 LAB — MICROALBUMIN / CREATININE URINE RATIO
Creatinine,U: 126.6 mg/dL
Microalb Creat Ratio: 2.8 mg/g (ref 0.0–30.0)
Microalb, Ur: 3.5 mg/dL — ABNORMAL HIGH (ref 0.0–1.9)

## 2016-10-02 LAB — HEPATIC FUNCTION PANEL
ALK PHOS: 61 U/L (ref 39–117)
ALT: 10 U/L (ref 0–53)
AST: 12 U/L (ref 0–37)
Albumin: 4.5 g/dL (ref 3.5–5.2)
BILIRUBIN DIRECT: 0.1 mg/dL (ref 0.0–0.3)
TOTAL PROTEIN: 6.6 g/dL (ref 6.0–8.3)
Total Bilirubin: 0.5 mg/dL (ref 0.2–1.2)

## 2016-10-02 LAB — CBC WITH DIFFERENTIAL/PLATELET
BASOS PCT: 0.5 % (ref 0.0–3.0)
Basophils Absolute: 0 10*3/uL (ref 0.0–0.1)
Eosinophils Absolute: 0.2 10*3/uL (ref 0.0–0.7)
Eosinophils Relative: 2 % (ref 0.0–5.0)
HCT: 36.9 % — ABNORMAL LOW (ref 39.0–52.0)
HEMOGLOBIN: 12.1 g/dL — AB (ref 13.0–17.0)
LYMPHS ABS: 2 10*3/uL (ref 0.7–4.0)
Lymphocytes Relative: 26.5 % (ref 12.0–46.0)
MCHC: 32.7 g/dL (ref 30.0–36.0)
MCV: 83.2 fl (ref 78.0–100.0)
MONO ABS: 0.9 10*3/uL (ref 0.1–1.0)
MONOS PCT: 12.2 % — AB (ref 3.0–12.0)
NEUTROS PCT: 58.8 % (ref 43.0–77.0)
Neutro Abs: 4.5 10*3/uL (ref 1.4–7.7)
Platelets: 214 10*3/uL (ref 150.0–400.0)
RBC: 4.44 Mil/uL (ref 4.22–5.81)
RDW: 14.4 % (ref 11.5–15.5)
WBC: 7.6 10*3/uL (ref 4.0–10.5)

## 2016-10-02 LAB — POCT GLYCOSYLATED HEMOGLOBIN (HGB A1C): Hemoglobin A1C: 5.8

## 2016-10-02 LAB — LIPID PANEL
CHOL/HDL RATIO: 4
Cholesterol: 197 mg/dL (ref 0–200)
HDL: 44.4 mg/dL (ref >39.00)
LDL Cholesterol: 125 mg/dL — ABNORMAL HIGH (ref 0–99)
NONHDL: 152.85
Triglycerides: 138 mg/dL (ref 0.0–149.0)
VLDL: 27.6 mg/dL (ref 0.0–40.0)

## 2016-10-02 LAB — BASIC METABOLIC PANEL
BUN: 11 mg/dL (ref 6–23)
CO2: 33 meq/L — AB (ref 19–32)
CREATININE: 0.69 mg/dL (ref 0.40–1.50)
Calcium: 9.6 mg/dL (ref 8.4–10.5)
Chloride: 93 mEq/L — ABNORMAL LOW (ref 96–112)
GFR: 118.83 mL/min (ref >60.00)
Glucose, Bld: 109 mg/dL — ABNORMAL HIGH (ref 70–99)
POTASSIUM: 4.7 meq/L (ref 3.5–5.1)
Sodium: 132 mEq/L — ABNORMAL LOW (ref 135–145)

## 2016-10-02 LAB — PSA, MEDICARE: PSA: 0.22 ng/mL (ref 0.10–4.00)

## 2016-10-02 LAB — TSH: TSH: 2.46 u[IU]/mL (ref 0.35–4.50)

## 2016-10-02 LAB — SEDIMENTATION RATE: Sed Rate: 25 mm/hr — ABNORMAL HIGH (ref 0–20)

## 2016-10-02 NOTE — Progress Notes (Signed)
we discussed code status.  pt requests full code, but would not want to be started or maintained on artificial life-support measures if there was not a reasonable chance of recovery 

## 2016-10-02 NOTE — Progress Notes (Signed)
Subjective:    Patient ID: Ronnie Snyder, male    DOB: 05/26/1940, 76 y.o.   MRN: [id number]  HPI Pt has many years of intermitt severe pain at the lower back.  It got worse approx 1 month ago.  No assoc numbness.    Past Medical History:  Diagnosis Date  . Abdominal aortic aneurysm (HCC)    3 cm aneurysm followed with serial ultrasound  . Arthritis    "in my feet" (10/13/2012)  . GERD (gastroesophageal reflux disease)   . H/O hiatal hernia   . Hepatic steatosis   . High cholesterol    "at one time; it's fine now" (10/13/2012)  . HTN (hypertension)   . Hx of colonic polyps   . Hypothyroidism   . PAD (peripheral artery disease) ([place])   . Skin cancer    "burned them off my arm and such" (10/13/2012)    Past Surgical History:  Procedure Laterality Date  . ABDOMINAL ANGIOGRAM N/A 12/04/2011   Procedure: ABDOMINAL ANGIOGRAM;  Surgeon: Sherren Mocha, MD;  Location: [facility] CATH LAB;  Service: Cardiovascular;  Laterality: N/A;  . ABDOMINAL AORTAGRAM N/A 10/13/2012   Procedure: ABDOMINAL Maxcine Ham;  Surgeon: Serafina Mitchell, MD;  Location: [facility] CATH LAB;  Service: Cardiovascular;  Laterality: N/A;  . ANGIOPLASTY / STENTING FEMORAL Left 10/13/2012  . ESOPHAGOGASTRODUODENOSCOPY    . FEMORAL ENDARTERECTOMY Left 01/23/12   Left Endarterectomy  with bovie patch Angioplasy  . gated spect wall motion stress cardiolite  02/10/2002  . HIP FRACTURE SURGERY Right 1990's  . INGUINAL HERNIA REPAIR Right    "years ago" (10/13/2012)  . NOSE SURGERY    . TONSILLECTOMY     "I was a chld" (10/13/2012)    Social History   Social History  . Marital status: Married    Spouse name: N/A  . Number of children: N/A  . Years of education: N/A   Occupational History  . retired     Therapist, music   Social History Main Topics  . Smoking status: Light Tobacco Smoker    Packs/day: 1.00    Years: 57.00    Types: Cigarettes  . Smokeless tobacco: Never Used     Comment: pt states there is no hope for quitting    . Alcohol use 3.6 oz/week    6 Cans of beer per week     Comment: 10/13/2012 "drink a 6 pack/wk"  . Drug use: No  . Sexual activity: No   Other Topics Concern  . Not on file   Social History Narrative  . No narrative on file    Current Outpatient Prescriptions on File Prior to Visit  Medication Sig Dispense Refill  . ALPRAZolam (XANAX) 0.25 MG tablet take 1 tablet by mouth at bedtime if needed 30 tablet 0  . amLODipine (NORVASC) 5 MG tablet TAKE 1 TABLET EVERY DAY 90 tablet 3  . aspirin 81 MG tablet Take 81 mg by mouth every other day.     . clopidogrel (PLAVIX) 75 MG tablet take 1 tablet by mouth once daily 30 tablet 6  . lansoprazole (PREVACID) 30 MG capsule Take 30 mg by mouth every other day.    . levothyroxine (SYNTHROID, LEVOTHROID) 75 MCG tablet TAKE 1 TABLET DAILY BEFORE BREAKFAST. 90 tablet 3  . losartan-hydrochlorothiazide (HYZAAR) 50-12.5 MG tablet take 1 tablet by mouth once daily 120 tablet 0   No current facility-administered medications on file prior to visit.     Allergies  Allergen Reactions  . Vicodin [  Hydrocodone-Acetaminophen] Nausea Only  . Atorvastatin Nausea Only    REACTION: sick  . Other     ALL PAINS/NARCOTIC MEDS-CANNOT TOLERATE WELL  . Pioglitazone Nausea Only  . Penicillins Rash    Family History  Problem Relation Age of Onset  . Hypertension Mother   . Hypertension Father   . Hypertension Sister   . Hypertension Sister     BP 122/76   Pulse 77   Ht 5\' 9"  (1.753 m)   Wt 142 lb (64.4 kg)   SpO2 92%   BMI 20.97 kg/m    Review of Systems Denies bowel or bladder retention.     Objective:   Physical Exam VITAL SIGNS:  See vs page GENERAL: no distress Spine: nontender Gait: normal and steady Pulses: dorsalis pedis intact bilat.   MSK: no deformity of the feet CV: no leg edema Skin:  no ulcer on the feet.  normal color and temp on the feet. Neuro: sensation is intact to touch on the feet.   Lab Results  Component Value Date    WBC 7.6 10/02/2016   HGB 12.1 (L) 10/02/2016   HCT 36.9 (L) 10/02/2016   MCV 83.2 10/02/2016   PLT 214.0 10/02/2016      Assessment & Plan:  Low back pain, worse for uncertain reason.  Check MRI Anemia: new.  Check fe.     Subjective:   Patient here for Medicare annual wellness visit and management of other chronic and acute problems.     Risk factors: advanced age    30 of Physicians Providing Medical Care to Patient:  See "snapshot"   Activities of Daily Living: In your present state of health, do you have any difficulty performing the following activities (lives with wife, who is in poor health)?:  Preparing food and eating?: No  Bathing yourself: No  Getting dressed: No  Using the toilet:No  Moving around from place to place: No  In the past year have you fallen or had a near fall?:No    Home Safety: Has smoke detector and wears seat belts. No firearms. No excess sun exposure.  Diet and Exercise  Current exercise habits: limited by back pain Dietary issues discussed: pt says he does not eat a healthy diet.    Depression Screen  Q1: Over the past two weeks, have you felt down, depressed or hopeless? no  Q2: Over the past two weeks, have you felt little interest or pleasure in doing things? no   The following portions of the patient's history were reviewed and updated as appropriate: allergies, current medications, past family history, past medical history, past social history, past surgical history and problem list.   Review of Systems  Denies hearing loss, and visual loss. Objective:   Vision:  Advertising account executive, so he declines VA today.  Hearing: grossly normal Body mass index:  See vs page.  Msk: pt easily and quickly performs "get-up-and-go" from a sitting position.   Cognitive Impairment Assessment: cognition, memory and judgment appear normal.  remembers 3/3 at 5 minutes.  excellent recall.  can easily read and write a sentence.  alert and oriented x  3.     Assessment:   Medicare wellness utd on preventive parameters    Plan:   During the course of the visit the patient was educated and counseled about appropriate screening and preventive services including:        Fall prevention   Diabetes screening  Nutrition counseling   LABS are ordered Vaccines  are UTD  today    Patient Instructions (the written plan) was given to the patient.

## 2016-10-02 NOTE — Patient Instructions (Addendum)
Let's check the MRI.  you will receive a phone call, about a day and time for an appointment.   blood tests are requested for you today.  We'll let you know about the results.   good diet and exercise significantly improve your health.  please let me know if you wish to be referred to a dietician.  high blood sugar is very risky to your health.  you should see an eye doctor and dentist every year.  It is very important to get all recommended vaccinations.  please consider these measures for your health:  minimize alcohol.  do not use tobacco products.  have a colonoscopy at least every 10 years from age 4.  keep firearms safely stored.  always use seat belts.  have working smoke alarms in your home.  see an eye doctor and dentist regularly.  never drive under the influence of alcohol or drugs (including prescription drugs).  those with fair skin should take precautions against the sun.  it is critically important to prevent falling down (keep floor areas well-lit, dry, and free of loose objects.  If you have a cane, walker, or wheelchair, you should use it, even for short trips around the house.  Wear flat-soled shoes.  Also, try not to rush) Please return in 1 year.

## 2016-10-07 ENCOUNTER — Encounter: Payer: Self-pay | Admitting: Endocrinology

## 2016-10-07 NOTE — Telephone Encounter (Signed)
Please forward message to PCP

## 2016-10-08 ENCOUNTER — Encounter: Payer: Self-pay | Admitting: Endocrinology

## 2016-10-08 ENCOUNTER — Other Ambulatory Visit: Payer: Self-pay | Admitting: Endocrinology

## 2016-10-08 MED ORDER — LIDOCAINE 5 % EX PTCH: 1.0000 | MEDICATED_PATCH | CUTANEOUS | 3 refills | Status: DC

## 2016-10-09 ENCOUNTER — Encounter: Payer: Self-pay | Admitting: Endocrinology

## 2016-10-09 ENCOUNTER — Other Ambulatory Visit: Payer: Self-pay | Admitting: Endocrinology

## 2016-10-09 MED ORDER — DICLOFENAC SODIUM 1 % TD GEL: 4.0000 g | Freq: Four times a day (QID) | TRANSDERMAL | 11 refills | Status: DC

## 2016-10-11 ENCOUNTER — Encounter: Payer: Self-pay | Admitting: Endocrinology

## 2016-10-12 ENCOUNTER — Encounter: Payer: Self-pay | Admitting: Endocrinology

## 2016-10-12 LAB — COLOGUARD

## 2016-10-15 ENCOUNTER — Other Ambulatory Visit: Payer: Self-pay | Admitting: Endocrinology

## 2016-10-15 DIAGNOSIS — M545 Low back pain, unspecified: Secondary | ICD-10-CM

## 2016-10-16 ENCOUNTER — Telehealth: Payer: Self-pay | Admitting: Endocrinology

## 2016-10-16 NOTE — Telephone Encounter (Signed)
The injection is outside the scope of my practice.  Do you want to be ref to a specialist?

## 2016-10-16 NOTE — Telephone Encounter (Signed)
Spoke with pt earlier today to let him know I had gotten his MRI scheduled. [facility] was quicker so I scheduled him for 6/5 at 5 PM. Pt stated he didn't want to go to [facility] because his wife is admitted at [facility]. When I told him that [facility] didn't have anything available until 6/11, pt stated he couldn't wait that long, pt had me cancel MRI altogether. He states he doesn't have time to have one done now, but he wants to see if he can get some sort of injection to help with the pain. Can you please call him sometime to go over what else he can do to alleviate pain? Thanks!

## 2016-10-17 ENCOUNTER — Ambulatory Visit (INDEPENDENT_AMBULATORY_CARE_PROVIDER_SITE_OTHER): Payer: Medicare Other | Admitting: Endocrinology

## 2016-10-17 ENCOUNTER — Encounter: Payer: Self-pay | Admitting: Endocrinology

## 2016-10-17 VITALS — BP 138/70 | HR 74 | Ht 69.0 in | Wt 142.0 lb

## 2016-10-17 DIAGNOSIS — M545 Low back pain, unspecified: Secondary | ICD-10-CM

## 2016-10-17 DIAGNOSIS — D649 Anemia, unspecified: Secondary | ICD-10-CM | POA: Diagnosis not present

## 2016-10-17 DIAGNOSIS — E1151 Type 2 diabetes mellitus with diabetic peripheral angiopathy without gangrene: Secondary | ICD-10-CM | POA: Diagnosis not present

## 2016-10-17 LAB — IBC PANEL
Iron: 60 ug/dL (ref 42–165)
Saturation Ratios: 14 % — ABNORMAL LOW (ref 20.0–50.0)
Transferrin: 306 mg/dL (ref 212.0–360.0)

## 2016-10-17 LAB — URINALYSIS, ROUTINE W REFLEX MICROSCOPIC
Bilirubin Urine: NEGATIVE
Hgb urine dipstick: NEGATIVE
Ketones, ur: NEGATIVE
Leukocytes, UA: NEGATIVE
Nitrite: NEGATIVE
RBC / HPF: NONE SEEN
Specific Gravity, Urine: <=1.005 — AB
Total Protein, Urine: NEGATIVE
Urine Glucose: NEGATIVE
Urobilinogen, UA: 0.2
WBC, UA: NONE SEEN
pH: 7 (ref 5.0–8.0)

## 2016-10-17 MED ORDER — HYDROCODONE-ACETAMINOPHEN 5-325 MG PO TABS: 1.0000 | ORAL_TABLET | Freq: Four times a day (QID) | ORAL | 0 refills | Status: DC | PRN

## 2016-10-17 NOTE — Telephone Encounter (Signed)
Patient as seen today and is aware of the previous note

## 2016-10-17 NOTE — Patient Instructions (Addendum)
Here is a prescription for pain. It is not safe to take the alprazolam with this.   Please see a specialist.  you will receive a phone call, about a day and time for an appointment.   blood tests are requested for you today.  We'll let you know about the results.  Here are some tests for blood in the bowels.  please follow the instructions, and return to the lab.
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
## 2016-10-22 ENCOUNTER — Ambulatory Visit (HOSPITAL_COMMUNITY): Payer: Medicare Other

## 2016-10-25 ENCOUNTER — Other Ambulatory Visit (INDEPENDENT_AMBULATORY_CARE_PROVIDER_SITE_OTHER): Payer: Medicare Other

## 2016-10-25 DIAGNOSIS — Z1211 Encounter for screening for malignant neoplasm of colon: Secondary | ICD-10-CM | POA: Diagnosis not present

## 2016-10-28 ENCOUNTER — Other Ambulatory Visit: Payer: Self-pay

## 2016-10-28 ENCOUNTER — Other Ambulatory Visit: Payer: Medicare Other

## 2016-10-28 DIAGNOSIS — Z1211 Encounter for screening for malignant neoplasm of colon: Secondary | ICD-10-CM

## 2016-10-28 LAB — FECAL OCCULT BLOOD, IMMUNOCHEMICAL: FECAL OCCULT BLD: NEGATIVE

## 2016-10-30 ENCOUNTER — Ambulatory Visit (INDEPENDENT_AMBULATORY_CARE_PROVIDER_SITE_OTHER): Payer: Medicare Other | Admitting: Orthopedic Surgery

## 2016-10-30 ENCOUNTER — Telehealth: Payer: Self-pay | Admitting: Endocrinology

## 2016-10-30 ENCOUNTER — Encounter (INDEPENDENT_AMBULATORY_CARE_PROVIDER_SITE_OTHER): Payer: Self-pay | Admitting: Orthopedic Surgery

## 2016-10-30 DIAGNOSIS — M79605 Pain in left leg: Principal | ICD-10-CM

## 2016-10-30 DIAGNOSIS — I714 Abdominal aortic aneurysm, without rupture, unspecified: Secondary | ICD-10-CM

## 2016-10-30 DIAGNOSIS — M545 Low back pain, unspecified: Secondary | ICD-10-CM
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
## 2016-10-30 NOTE — Telephone Encounter (Signed)
**  Remind patient they can make refill requests via MyChart**  Medication refill request (Name & Dosage):  HYDROcodone-acetaminophen (NORCO/VICODIN) 5-325 MG tablet  Preferred pharmacy (Name & Address):   [facility], [place]  Other comments (if applicable):

## 2016-10-30 NOTE — Telephone Encounter (Signed)
I contacted the patient's son and advised this refill was just provided during his office visit on 10/17/2016 and is too early to refill again. He voiced understanding and stated the patient was not out of his medication but just wanted to have another refill on hand. I advised we could refill after 30 days.

## 2016-11-02 ENCOUNTER — Other Ambulatory Visit: Payer: Self-pay | Admitting: Endocrinology

## 2016-11-05 ENCOUNTER — Ambulatory Visit
Admission: RE | Admit: 2016-11-05 | Discharge: 2016-11-05 | Disposition: A | Discharge status: Home / Self Care | Payer: Medicare Other | Source: Ambulatory Visit | Attending: Orthopedic Surgery | Admitting: Orthopedic Surgery

## 2016-11-05 ENCOUNTER — Other Ambulatory Visit: Payer: Self-pay | Admitting: Endocrinology

## 2016-11-05 DIAGNOSIS — M545 Low back pain, unspecified: Secondary | ICD-10-CM

## 2016-11-05 DIAGNOSIS — M79605 Pain in left leg: Principal | ICD-10-CM

## 2016-11-07 ENCOUNTER — Other Ambulatory Visit (INDEPENDENT_AMBULATORY_CARE_PROVIDER_SITE_OTHER): Payer: Self-pay

## 2016-11-07 ENCOUNTER — Telehealth (INDEPENDENT_AMBULATORY_CARE_PROVIDER_SITE_OTHER): Payer: Self-pay

## 2016-11-07 DIAGNOSIS — M5442 Lumbago with sciatica, left side: Principal | ICD-10-CM

## 2016-11-07 DIAGNOSIS — G8929 Other chronic pain: Secondary | ICD-10-CM

## 2016-11-07 NOTE — Telephone Encounter (Signed)
-----   Message from Newt Minion, MD sent at 11/06/2016 10:29 AM EDT ----- Have patient follow-up with Dr. Ernestina Patches For evaluation for epidural steroid injection.

## 2016-11-07 NOTE — Telephone Encounter (Signed)
Called pt to advise of Ct result and referral to Dr. Ernestina Patches for detailed review and possible ESI injection.

## 2016-11-08 ENCOUNTER — Other Ambulatory Visit: Payer: Self-pay | Admitting: Endocrinology

## 2016-11-13 ENCOUNTER — Telehealth (INDEPENDENT_AMBULATORY_CARE_PROVIDER_SITE_OTHER): Payer: Self-pay | Admitting: Physical Medicine and Rehabilitation

## 2016-11-13 ENCOUNTER — Ambulatory Visit (INDEPENDENT_AMBULATORY_CARE_PROVIDER_SITE_OTHER): Payer: Medicare Other | Admitting: Orthopedic Surgery

## 2016-11-13 NOTE — Telephone Encounter (Signed)
Patient will begin holding Plavix on 11/20/16. He will continue aspirin.

## 2016-11-13 NOTE — Telephone Encounter (Signed)
-----   Message from Waynetta Sandy, MD sent at 11/08/2016  4:04 PM EDT ----- Regarding: RE: Plavix Should be ok. If he can take aspirin in the interim that would be preferable.   Erlene Quan  ----- Message ----- From: Sherre Scarlet, RT Sent: 11/08/2016  10:50 AM To: Waynetta Sandy, MD Subject: Plavix                                         Patient needs to schedule an epidural steroid injection with Dr. Ernestina Patches at [facility]. He will need to hold Plavix for 7 days prior to this. Will this be ok?

## 2016-11-18 ENCOUNTER — Other Ambulatory Visit: Payer: Self-pay | Admitting: Endocrinology

## 2016-11-18 NOTE — Telephone Encounter (Signed)
Ok to refill please advise 

## 2016-11-18 NOTE — Telephone Encounter (Signed)
**  Remind patient they can make refill requests via MyChart**  Medication refill request (Name & Dosage):  HYDROcodone-acetaminophen (NORCO/VICODIN) 5-325 MG tablet   Preferred pharmacy (Name & Address):   [facility], [place]  Other comments (if applicable):   Advised patient that he will need to come pick up the rx from the office since it is a controlled substance. Call patient once it is ready.

## 2016-11-18 NOTE — Telephone Encounter (Signed)
Forwarding

## 2016-11-19 ENCOUNTER — Telehealth: Payer: Self-pay | Admitting: Endocrinology

## 2016-11-19 ENCOUNTER — Encounter: Payer: Self-pay | Admitting: Endocrinology

## 2016-11-19 NOTE — Telephone Encounter (Signed)
Attempted to reach the patient, but phone number was busy. On 11/19/2016 the patient sent a my chart message about this medication. I responded to the patient via my chart on the reason why the medication was discontinued.

## 2016-11-19 NOTE — Telephone Encounter (Signed)
Pt needs refill on pain meds please °

## 2016-11-19 NOTE — Telephone Encounter (Signed)
Pt was ref to specialist for this problem.

## 2016-11-21 ENCOUNTER — Ambulatory Visit: Payer: Medicare Other | Admitting: Endocrinology

## 2016-11-27 ENCOUNTER — Ambulatory Visit (INDEPENDENT_AMBULATORY_CARE_PROVIDER_SITE_OTHER): Payer: Medicare Other

## 2016-11-27 ENCOUNTER — Ambulatory Visit (INDEPENDENT_AMBULATORY_CARE_PROVIDER_SITE_OTHER): Payer: Medicare Other | Admitting: Physical Medicine and Rehabilitation

## 2016-11-27 ENCOUNTER — Encounter (INDEPENDENT_AMBULATORY_CARE_PROVIDER_SITE_OTHER): Payer: Self-pay | Admitting: Physical Medicine and Rehabilitation

## 2016-11-27 VITALS — BP 130/80 | HR 86

## 2016-11-27 DIAGNOSIS — M4316 Spondylolisthesis, lumbar region: Secondary | ICD-10-CM

## 2016-11-27 DIAGNOSIS — M5416 Radiculopathy, lumbar region: Secondary | ICD-10-CM

## 2016-11-27 MED ORDER — METHYLPREDNISOLONE ACETATE 80 MG/ML IJ SUSP
80.0000 mg | Freq: Once | INTRAMUSCULAR | Status: AC
  Administered 2016-11-27: 80 mg

## 2016-11-27 MED ORDER — LIDOCAINE HCL (PF) 1 % IJ SOLN
2.0000 mL | Freq: Once | INTRAMUSCULAR | Status: AC
  Administered 2016-11-27: 2 mL

## 2016-11-27 NOTE — Progress Notes (Deleted)
Left side low back pain around to the front of leg to knee and then down back of the leg to foot. Numbness down leg with standing. Shock like pain down leg at times.

## 2016-11-27 NOTE — Patient Instructions (Signed)

## 2016-11-27 NOTE — Procedures (Signed)
Lumbosacral Transforaminal Epidural Steroid Injection - Infraneural Approach with Fluoroscopic Guidance  Patient: Ronnie Snyder      Date of Birth: 11-May-1941 MRN: [id number] PCP: Renato Shin, MD      Visit Date: 11/27/2016   Mr. Cobos is a 76 year old gentleman with chronic several month history of worsening left radicular leg pain and somewhat of an L4-L5 distribution down to the calf. He does have axial low back pain as well. Worse with standing and ambulating better at rest. Failing conservative care. He's been followed by Dr. Sharol Given did obtain the MRI and is been treating him conservatively. We are going to complete a diagnostic and therapeutic left L4-L5 transforaminal epidural steroid injection. Would consider facet joint blocks depending on outcome of his back pain.  Universal Protocol:     Consent Given By: the patient  Position: PRONE   Additional Comments: Vital signs were monitored before and after the procedure. Patient was prepped and draped in the usual sterile fashion. The correct patient, procedure, and site was verified.   Injection Procedure Details:  Procedure Site One Meds Administered:  Meds ordered this encounter  Medications  . lidocaine (PF) (XYLOCAINE) 1 % injection 2 mL  . methylPREDNISolone acetate (DEPO-MEDROL) injection 80 mg      Laterality: Left  Location/Site:  L4-L5 L5-S1  Needle size: 22 G  Needle type: Spinal  Needle Placement: Transforaminal  Findings:  -Contrast Used: 1 mL iohexol 180 mg iodine/mL   -Comments: Excellent flow of contrast along the nerve and into the epidural space.  Procedure Details: After squaring off the end-plates of the desired vertebral level to get a true AP view, the C-arm was obliqued to the painful side so that the superior articulating process is positioned about 1/3 the length of the inferior endplate.  The needle was aimed toward the junction of the superior articular process and the  transverse process of the inferior vertebrae. The needle's initial entry is in the lower third of the foramen through Kambin's triangle. The soft tissues overlying this target were infiltrated with 2-3 ml. of 1% Lidocaine without Epinephrine.  The spinal needle was then inserted and advanced toward the target using a "trajectory" view along the fluoroscope beam.  Under AP and lateral visualization, the needle was advanced so it did not puncture dura and did not traverse medially beyond the 6 o'clock position of the pedicle. Bi-planar projections were used to confirm position. Aspiration was confirmed to be negative for CSF and/or blood. A 1-2 ml. volume of Isovue-250 was injected and flow of contrast was noted at each level. Radiographs were obtained for documentation purposes.   After attaining the desired flow of contrast documented above, a 0.5 to 1.0 ml test dose of 0.25% Marcaine was injected into each respective transforaminal space.  The patient was observed for 90 seconds post injection.  After no sensory deficits were reported, and normal lower extremity motor function was noted,   the above injectate was administered so that equal amounts of the injectate were placed at each foramen (level) into the transforaminal epidural space.   Additional Comments:  The patient tolerated the procedure well Dressing: Band-Aid    Post-procedure details: Patient was observed during the procedure. Post-procedure instructions were reviewed.  Patient left the clinic in stable condition.

## 2016-12-03 ENCOUNTER — Telehealth (INDEPENDENT_AMBULATORY_CARE_PROVIDER_SITE_OTHER): Payer: Self-pay | Admitting: Physical Medicine and Rehabilitation

## 2016-12-03 NOTE — Telephone Encounter (Signed)
Yes that is correct, we need to make an effort to keep a standard of telling them when to return to taking their anticoagulations. Yes, we likely told him but we should come up with documentation. I usually tell them in the room before moving to injection room.

## 2016-12-05 ENCOUNTER — Encounter: Payer: Self-pay | Admitting: Endocrinology

## 2016-12-09 ENCOUNTER — Other Ambulatory Visit: Payer: Self-pay | Admitting: Endocrinology

## 2016-12-09 NOTE — Telephone Encounter (Signed)
Patient is callong back requesting appt, he is not taking his blood thinner so he can come any time--patient sched for 12/11/16 @ 1pm w/ [place]

## 2016-12-10 ENCOUNTER — Other Ambulatory Visit: Payer: Self-pay | Admitting: Endocrinology

## 2016-12-10 ENCOUNTER — Encounter: Payer: Self-pay | Admitting: Endocrinology

## 2016-12-10 MED ORDER — ALPRAZOLAM 0.25 MG PO TABS: 0.2500 mg | ORAL_TABLET | Freq: Two times a day (BID) | ORAL | 0 refills | Status: DC | PRN

## 2016-12-11 ENCOUNTER — Ambulatory Visit (INDEPENDENT_AMBULATORY_CARE_PROVIDER_SITE_OTHER): Payer: Medicare Other

## 2016-12-11 ENCOUNTER — Ambulatory Visit (INDEPENDENT_AMBULATORY_CARE_PROVIDER_SITE_OTHER): Payer: Medicare Other | Admitting: Physical Medicine and Rehabilitation

## 2016-12-11 ENCOUNTER — Encounter (INDEPENDENT_AMBULATORY_CARE_PROVIDER_SITE_OTHER): Payer: Self-pay | Admitting: Physical Medicine and Rehabilitation

## 2016-12-11 VITALS — BP 157/77 | HR 72

## 2016-12-11 DIAGNOSIS — M5416 Radiculopathy, lumbar region: Secondary | ICD-10-CM | POA: Diagnosis not present

## 2016-12-11 MED ORDER — LIDOCAINE HCL (PF) 1 % IJ SOLN
2.0000 mL | Freq: Once | INTRAMUSCULAR | Status: AC
  Administered 2016-12-11: 2 mL

## 2016-12-11 MED ORDER — METHYLPREDNISOLONE ACETATE 80 MG/ML IJ SUSP
80.0000 mg | Freq: Once | INTRAMUSCULAR | Status: AC
  Administered 2016-12-11: 80 mg

## 2016-12-11 NOTE — Patient Instructions (Signed)

## 2016-12-11 NOTE — Progress Notes (Deleted)
Left side low back pain down leg to calf. Constant pain. Pain gets worse the longer he walks. His wife is in the hospital and has been having to do a lot of walking around there.

## 2016-12-11 NOTE — Progress Notes (Unsigned)
Fluoro Time: 48 sec MGy: 24.96

## 2016-12-12 NOTE — Procedures (Signed)
Ronnie Snyder is a 76 year old gentleman with left low back and hip pain some referral leg. He did well with prior left L4-L5 transforaminal epidural steroid injection. He's been under a lot of stress with his wife in the hospital symptoms have returned. He did get really good relief his symptoms are more of an L5 distribution worse with standing and walking. Lumbosacral Transforaminal Epidural Steroid Injection - Infraneural Approach with Fluoroscopic Guidance  Patient: Ronnie Snyder      Date of Birth: April 26, 1941 MRN: [id number] PCP: Renato Shin, MD      Visit Date: 12/11/2016   Universal Protocol:     Consent Given By: the patient  Position: PRONE   Additional Comments: Vital signs were monitored before and after the procedure. Patient was prepped and draped in the usual sterile fashion. The correct patient, procedure, and site was verified.   Injection Procedure Details:  Procedure Site One Meds Administered:  Meds ordered this encounter  Medications  . lidocaine (PF) (XYLOCAINE) 1 % injection 2 mL  . methylPREDNISolone acetate (DEPO-MEDROL) injection 80 mg      Laterality: Left  Location/Site:  L4-L5 L5-S1  Needle size: 22 G  Needle type: Spinal  Needle Placement: Transforaminal  Findings:  -Contrast Used: 1 mL iohexol 180 mg iodine/mL   -Comments: Excellent flow of contrast along the nerve and into the epidural space.  Procedure Details: After squaring off the end-plates of the desired vertebral level to get a true AP view, the C-arm was obliqued to the painful side so that the superior articulating process is positioned about 1/3 the length of the inferior endplate.  The needle was aimed toward the junction of the superior articular process and the transverse process of the inferior vertebrae. The needle's initial entry is in the lower third of the foramen through Kambin's triangle. The soft tissues overlying this target were infiltrated with 2-3 ml. of  1% Lidocaine without Epinephrine.  The spinal needle was then inserted and advanced toward the target using a "trajectory" view along the fluoroscope beam.  Under AP and lateral visualization, the needle was advanced so it did not puncture dura and did not traverse medially beyond the 6 o'clock position of the pedicle. Bi-planar projections were used to confirm position. Aspiration was confirmed to be negative for CSF and/or blood. A 1-2 ml. volume of Isovue-250 was injected and flow of contrast was noted at each level. Radiographs were obtained for documentation purposes.   After attaining the desired flow of contrast documented above, a 0.5 to 1.0 ml test dose of 0.25% Marcaine was injected into each respective transforaminal space.  The patient was observed for 90 seconds post injection.  After no sensory deficits were reported, and normal lower extremity motor function was noted,   the above injectate was administered so that equal amounts of the injectate were placed at each foramen (level) into the transforaminal epidural space.   Additional Comments:  The patient tolerated the procedure well Dressing: Band-Aid    Post-procedure details: Patient was observed during the procedure. Post-procedure instructions were reviewed.  Patient left the clinic in stable condition.

## 2017-01-13 ENCOUNTER — Telehealth (INDEPENDENT_AMBULATORY_CARE_PROVIDER_SITE_OTHER): Payer: Self-pay | Admitting: Physical Medicine and Rehabilitation

## 2017-01-14 NOTE — Telephone Encounter (Signed)
Left L4-5 interlaminar esi but if no help will need to get eval from surgeon for his stenosis versus PT

## 2017-01-14 NOTE — Telephone Encounter (Signed)
Called and left message.

## 2017-01-15 NOTE — Telephone Encounter (Signed)
Spoke with patient and he will have to check schedule and call me back. He reports he is no longer taking his blood thinners.

## 2017-01-15 NOTE — Telephone Encounter (Signed)
Scheduled for 01/27/17 at 1415.

## 2017-01-27 ENCOUNTER — Ambulatory Visit (INDEPENDENT_AMBULATORY_CARE_PROVIDER_SITE_OTHER): Payer: Self-pay

## 2017-01-27 ENCOUNTER — Ambulatory Visit (INDEPENDENT_AMBULATORY_CARE_PROVIDER_SITE_OTHER): Payer: Medicare Other | Admitting: Physical Medicine and Rehabilitation

## 2017-01-27 ENCOUNTER — Encounter (INDEPENDENT_AMBULATORY_CARE_PROVIDER_SITE_OTHER): Payer: Self-pay | Admitting: Physical Medicine and Rehabilitation

## 2017-01-27 VITALS — BP 156/78

## 2017-01-27 DIAGNOSIS — M4316 Spondylolisthesis, lumbar region: Secondary | ICD-10-CM | POA: Diagnosis not present

## 2017-01-27 DIAGNOSIS — M5416 Radiculopathy, lumbar region: Secondary | ICD-10-CM

## 2017-01-27 MED ORDER — BETAMETHASONE SOD PHOS & ACET 6 (3-3) MG/ML IJ SUSP
12.0000 mg | Freq: Once | INTRAMUSCULAR | Status: AC
  Administered 2017-01-27: 12 mg

## 2017-01-27 MED ORDER — TRAMADOL HCL 50 MG PO TABS: 50.0000 mg | ORAL_TABLET | Freq: Two times a day (BID) | ORAL | 0 refills | Status: DC | PRN

## 2017-01-27 MED ORDER — LIDOCAINE HCL (PF) 1 % IJ SOLN
2.0000 mL | Freq: Once | INTRAMUSCULAR | Status: AC
  Administered 2017-01-27: 2 mL

## 2017-01-27 NOTE — Progress Notes (Deleted)
Patient states he had around 45% relief with last injection. Left side low back radiating down leg to calf. Sharp pain into calf along with numbness.

## 2017-01-29 NOTE — Procedures (Signed)
[name] is a 76 year old gentleman who is followed by Dr. Sharol Given. His history of diabetes and peripheral artery disease. Lumbar spine shows some narrowing and stenosis but this is moderate at least on CT scan. He is someone is supposed to take Plavix but he has been off of Plavix now for several weeks of this I think is been always on the cord. 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 He reports continued pain in the left low back down the left calf with numbness. We have completed 2 transforaminal epidural steroid injections each with only about 45% relief and very short-lived. He comes in today with continued pain. Within a complete an intralaminar injection. This with the last injection performed if he doesn't get sustained relief. He is using mild bit of pain medication. The next step would be to follow up with Dr. Sharol Given to potentially look at a more vascular source of his pain or possible referral to a spine surgeon although his spine doesn't really look horrible from a stenosis standpoint.  Lumbar Epidural Steroid Injection - Interlaminar Approach with Fluoroscopic Guidance  Patient: Ronnie Snyder      Date of Birth: 1940/08/05 MRN: [id number] PCP: Renato Shin, MD      Visit Date: 01/27/2017   Universal Protocol:     Consent Given By: the patient  Position: PRONE  Additional Comments: Vital signs were monitored before and after the procedure. Patient was prepped and draped in the usual sterile fashion. The correct patient, procedure, and site was verified.   Injection Procedure Details:  Procedure Site One Meds Administered:  Meds ordered this encounter  Medications  . lidocaine (PF) (XYLOCAINE) 1 % injection 2 mL  . betamethasone acetate-betamethasone sodium phosphate (CELESTONE) injection 12 mg  . traMADol (ULTRAM) 50 MG tablet    Sig: Take 1 tablet (50 mg total) by mouth every 12 (twelve) hours as needed for moderate pain.    Dispense:  14 tablet   Refill:  0     Laterality: Left  Location/Site:  L4-L5  Needle size: 20 G  Needle type: Tuohy  Needle Placement: Paramedian epidural  Findings:  -Contrast Used: 1 mL iohexol 180 mg iodine/mL   -Comments: Excellent flow of contrast into the epidural space.  Procedure Details: Using a paramedian approach from the side mentioned above, the region overlying the inferior lamina was localized under fluoroscopic visualization and the soft tissues overlying this structure were infiltrated with 4 ml. of 1% Lidocaine without Epinephrine. The Tuohy needle was inserted into the epidural space using a paramedian approach.   The epidural space was localized using loss of resistance along with lateral and bi-planar fluoroscopic views.  After negative aspirate for air, blood, and CSF, a 2 ml. volume of Isovue-250 was injected into the epidural space and the flow of contrast was observed. Radiographs were obtained for documentation purposes.    The injectate was administered into the level noted above.   Additional Comments:  The patient tolerated the procedure well Dressing: Band-Aid    Post-procedure details: Patient was observed during the procedure. Post-procedure instructions were reviewed.  Patient left the clinic in stable condition.

## 2017-02-04 ENCOUNTER — Encounter: Payer: Self-pay | Admitting: Endocrinology

## 2017-02-04 ENCOUNTER — Other Ambulatory Visit: Payer: Self-pay | Admitting: Endocrinology

## 2017-02-10 ENCOUNTER — Telehealth (INDEPENDENT_AMBULATORY_CARE_PROVIDER_SITE_OTHER): Payer: Self-pay | Admitting: Physical Medicine and Rehabilitation

## 2017-02-11 NOTE — Telephone Encounter (Signed)
He should follow with Dud to see if more vascular pain, 3 esi's no relief, Lumbar CT with only moderate Stenosis.

## 2017-02-11 NOTE — Telephone Encounter (Signed)
Scheduled with Dr. Sharol Given.

## 2017-02-13 ENCOUNTER — Other Ambulatory Visit (INDEPENDENT_AMBULATORY_CARE_PROVIDER_SITE_OTHER): Payer: Self-pay | Admitting: Physical Medicine and Rehabilitation

## 2017-02-13 NOTE — Telephone Encounter (Signed)
Please advise. Patient has an appointment scheduled with Dr. Sharol Given on 10/3.

## 2017-02-18 ENCOUNTER — Other Ambulatory Visit: Payer: Self-pay | Admitting: Endocrinology

## 2017-02-19 ENCOUNTER — Other Ambulatory Visit: Payer: Self-pay | Admitting: Endocrinology

## 2017-02-19 ENCOUNTER — Ambulatory Visit (INDEPENDENT_AMBULATORY_CARE_PROVIDER_SITE_OTHER): Payer: Medicare Other | Admitting: Orthopedic Surgery

## 2017-02-19 ENCOUNTER — Encounter (INDEPENDENT_AMBULATORY_CARE_PROVIDER_SITE_OTHER): Payer: Self-pay | Admitting: Orthopedic Surgery

## 2017-02-19 DIAGNOSIS — M545 Low back pain, unspecified: Secondary | ICD-10-CM

## 2017-02-19 DIAGNOSIS — M79605 Pain in left leg: Secondary | ICD-10-CM

## 2017-02-19 MED ORDER — NABUMETONE 750 MG PO TABS: 750.0000 mg | ORAL_TABLET | Freq: Two times a day (BID) | ORAL | 3 refills | Status: AC | PRN

## 2017-02-19 NOTE — Progress Notes (Signed)
Office Visit Note   Patient: Ronnie Snyder           Date of Birth: 07-09-1940           MRN: [id number] Visit Date: 02/19/2017              Requested by: Renato Shin, MD [address] [place],  [address] PCP: Renato Shin, MD  Chief Complaint  Patient presents with  . Lower Back - Follow-up      HPI: Patient is a 76 year old gentleman cachectic with chronic lower back pain with radiating pain to the lateral aspect the left calf. He has undergone lumbar spine injections. Dr. Ernestina Patches he states he's had about 6 injections all provided only temporary relief. He states he has a wife with Alzheimer's and he has to constantly be up and awake to take care of her.  Assessment & Plan: Visit Diagnoses:  1. Lumbar pain with radiation down left leg     Plan: Prescription provided for Relafen. Discussed that if he gets to a point where he could undergo surgical evaluation he will follow up with Dr. Lorin Mercy for evaluation for surgery for his lumbar spine.  Follow-Up Instructions: Return if symptoms worsen or fail to improve.   Ortho Exam  Patient is alert, oriented, no adenopathy, well-dressed, normal affect, normal respiratory effort. Examination patient can get from a sitting to a standing position without problems he uses a cane. He has a negative straight leg raise bilaterally and no focal motor weakness in either lower extremity. Examination of his back he has what appears to be one small bug bite there are no rashes he states that this itches does not have any burning pain no clinical signs of shingles.  Imaging: No results found. No images are attached to the encounter.  Labs: Lab Results  Component Value Date   HGBA1C 5.8 10/02/2016   HGBA1C 6.0 10/03/2015   HGBA1C 5.8 10/03/2014   ESRSEDRATE 25 (H) 10/02/2016    Orders:  No orders of the defined types were placed in this encounter.  Meds ordered this encounter  Medications  . nabumetone  (RELAFEN) 750 MG tablet    Sig: Take 1 tablet (750 mg total) by mouth 2 (two) times daily as needed for mild pain or moderate pain. with food    Dispense:  60 tablet    Refill:  3     Procedures: No procedures performed  Clinical Data: No additional findings.  ROS:  All other systems negative, except as noted in the HPI. Review of Systems  Objective: Vital Signs: There were no vitals taken for this visit.  Specialty Comments:  No specialty comments available.  PMFS History: Patient Active Problem List   Diagnosis Date Noted  . Anemia 10/02/2016  . Pain in joint, lower leg 10/03/2015  . Ecchymosis 09/23/2013  . PAD (peripheral artery disease) ([place]) 08/09/2013  . Aftercare following surgery of the circulatory system, [place] 02/01/2013  . Pain in limb-Left calf 02/01/2013  . Hyponatremia 08/21/2012  . Smoker 08/13/2012  . Other and unspecified alcohol dependence, unspecified drinking behavior 08/13/2012  . Diabetes ([place]) 08/13/2012  . Screening for prostate cancer 08/13/2012  . Encounter for long-term (current) use of other medications 08/13/2012  . Pain of thigh 06/15/2012  . Atherosclerosis of native artery of extremity with intermittent claudication ([place]) 08/21/2011  . ECZEMA 01/24/2010  . SEBACEOUS CYST 01/24/2009  . COUGH 01/24/2009  . AAA 12/06/2008  . HYPERCHOLESTEROLEMIA 01/21/2008  .  ERECTILE DYSFUNCTION, ORGANIC 01/21/2008  . Back pain 01/21/2008  . 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 Anxiety state 03/07/2007  . ERECTILE DYSFUNCTION 03/07/2007  . SMOKER 03/07/2007  . DEPRESSION 03/07/2007  . Essential hypertension 03/07/2007  . CAROTID ARTERY STENOSIS 03/07/2007  . GERD 03/07/2007  . HIP PAIN, RIGHT 03/07/2007  . COLONIC POLYPS, HX OF 03/07/2007  . ADENOIDECTOMY, HX OF 03/07/2007   Past Medical History:  Diagnosis Date  . Abdominal aortic aneurysm (HCC)    3 cm aneurysm followed with serial ultrasound  . Arthritis    "in my feet" (10/13/2012)  . GERD  (gastroesophageal reflux disease)   . H/O hiatal hernia   . Hepatic steatosis   . High cholesterol    "at one time; it's fine now" (10/13/2012)  . HTN (hypertension)   . Hx of colonic polyps   . Hypothyroidism   . PAD (peripheral artery disease) ([place])   . Skin cancer    "burned them off my arm and such" (10/13/2012)    Family History  Problem Relation Age of Onset  . Hypertension Mother   . Hypertension Father   . Hypertension Sister   . Hypertension Sister     Past Surgical History:  Procedure Laterality Date  . ABDOMINAL ANGIOGRAM N/A 12/04/2011   Procedure: ABDOMINAL ANGIOGRAM;  Surgeon: Sherren Mocha, MD;  Location: [facility] CATH LAB;  Service: Cardiovascular;  Laterality: N/A;  . ABDOMINAL AORTAGRAM N/A 10/13/2012   Procedure: ABDOMINAL Maxcine Ham;  Surgeon: Serafina Mitchell, MD;  Location: [facility] CATH LAB;  Service: Cardiovascular;  Laterality: N/A;  . ANGIOPLASTY / STENTING FEMORAL Left 10/13/2012  . ESOPHAGOGASTRODUODENOSCOPY    . FEMORAL ENDARTERECTOMY Left 01/23/12   Left Endarterectomy  with bovie patch Angioplasy  . gated spect wall motion stress cardiolite  02/10/2002  . HIP FRACTURE SURGERY Right 1990's  . INGUINAL HERNIA REPAIR Right    "years ago" (10/13/2012)  . NOSE SURGERY    . TONSILLECTOMY     "I was a chld" (10/13/2012)   Social History   Occupational History  . retired     Therapist, music   Social History Main Topics  . Smoking status: Light Tobacco Smoker    Packs/day: 1.00    Years: 57.00    Types: Cigarettes  . Smokeless tobacco: Never Used     Comment: pt states there is no hope for quitting  . Alcohol use 3.6 oz/week    6 Cans of beer per week     Comment: 10/13/2012 "drink a 6 pack/wk"  . Drug use: No  . Sexual activity: No

## 2017-02-20 ENCOUNTER — Other Ambulatory Visit: Payer: Self-pay | Admitting: Endocrinology

## 2017-02-27 MED ORDER — ALPRAZOLAM 0.25 MG PO TABS: 0.2500 mg | ORAL_TABLET | Freq: Two times a day (BID) | ORAL | 0 refills | Status: DC | PRN

## 2017-02-27 NOTE — Telephone Encounter (Signed)
Please advise. Thanks.  

## 2017-03-10 ENCOUNTER — Other Ambulatory Visit: Payer: Self-pay | Admitting: Endocrinology

## 2017-03-14 ENCOUNTER — Encounter (INDEPENDENT_AMBULATORY_CARE_PROVIDER_SITE_OTHER): Payer: Self-pay | Admitting: Orthopaedic Surgery

## 2017-03-14 ENCOUNTER — Ambulatory Visit (INDEPENDENT_AMBULATORY_CARE_PROVIDER_SITE_OTHER): Payer: Medicare Other | Admitting: Orthopaedic Surgery

## 2017-03-14 VITALS — BP 150/86 | HR 77 | Ht 68.0 in | Wt 129.0 lb

## 2017-03-14 DIAGNOSIS — M5416 Radiculopathy, lumbar region: Secondary | ICD-10-CM

## 2017-03-14 DIAGNOSIS — M48062 Spinal stenosis, lumbar region with neurogenic claudication: Secondary | ICD-10-CM

## 2017-03-14 NOTE — Telephone Encounter (Signed)
Thi was refilled last week

## 2017-03-14 NOTE — Progress Notes (Signed)
Office Visit Note   Patient: Ronnie Snyder           Date of Birth: 10-09-1940           MRN: [id number] Visit Date: 03/14/2017              Requested by: Ronnie Shin, MD [address] [place], [place] [address] PCP: Ronnie Shin, MD   Assessment & Plan: Visit Diagnoses:  1. Radiculopathy, lumbar region   2. Spinal stenosis of lumbar region with neurogenic claudication     Plan: I recommend repeating his ABIs make sure arterial function is good and not contributing to his claudication symptoms. Has significant stenosis at L4-5 symptomatic and option would be single level LIV-5 decompression without fusion. He would end up staying overnight admission due to his current insurance. Procedure discussed with surgery discussed questions was answered. If his ABIs are satisfactory he can proceed with surgical scheduling.  Previous CT images plain radiographs are reviewed and discussed pathophysiology discussed of lumbar stenosis multifactorial.  Follow-Up Instructions: No Follow-up on file.   Orders:  No orders of the defined types were placed in this encounter.  No orders of the defined types were placed in this encounter.     Procedures: No procedures performed   Clinical Data: No additional findings.   Subjective: Chief Complaint  Patient presents with  . Lower Back - Pain    HPI 76 year old male who seen in Dr. Sharol Given for back pain chronically said epidurals with Dr. Ernestina Patches with persistent problems. He has a stent in his leg cannot have an MRI scan and has CT scan which showed the severe stenosis at L4-5 multifactorial with the spondylolisthesis. He's had pain states he can only walk 150-200 feet. He gets some relief when he sits he does better leaning on her grocery cart. Post stenting 2015 ABIs showed satisfactory flow. Patient's had increased pain in his left leg which side with the stent. He's had pain in his tailbone. He is ambulatory with a cane.  Used to be on Plavix but stopped it. He is a caregiver for his wife with dementia but now has some help and doesn't have to lifters much as he had previously. He is still smoker has 3.5 cm AAA.  Review of Systems [place] GERD, carotid artery stenosis, previous femoropopliteal stent left leg. Positive for hypertension spinal stenosis L4-5 high cholesterol, anxiety, PAD, history of the anemia.   Objective: Vital Signs: BP (!) 150/86   Pulse 77   Ht 5\' 8"  (1.727 m)   Wt 129 lb (58.5 kg)   BMI 19.61 kg/m   Physical Exam  Constitutional: He is oriented to person, place, and time. He appears well-developed and well-nourished.  HENT:  Head: Normocephalic and atraumatic.  Eyes: Pupils are equal, round, and reactive to light. EOM are normal.  Neck: No tracheal deviation present. No thyromegaly present.  Cardiovascular: Normal rate.   Pulmonary/Chest: Effort normal. He has no wheezes.  Abdominal: Soft. Bowel sounds are normal.  Neurological: He is alert and oriented to person, place, and time.  Skin: Skin is warm and dry. Capillary refill takes less than 2 seconds.  Psychiatric: He has a normal mood and affect. His behavior is normal. Judgment and thought content normal.    Ortho Exam patient alert and oriented no pain with hip range of motion. The no inguinal lymphadenopathy. Knee reaches full extension. Anterior tib EHL is intact. Patient ambulates with a cane. Well-healed incision from left  femoropopliteal stent.  Specialty Comments:  No specialty comments available.  Imaging: Study Result   CLINICAL DATA:  Low back pain radiating into the left buttock and lateral aspect of the left leg to the ankle for 1.5 months. No known injury.  EXAM: CT LUMBAR SPINE WITHOUT CONTRAST  TECHNIQUE: Multidetector CT imaging of the lumbar spine was performed without intravenous contrast administration. Multiplanar CT image reconstructions were also generated.  COMPARISON:  Plain films  lumbar spine 09/23/2016. MRI lumbar spine 09/24/2004.  FINDINGS: Segmentation: Standard.  Alignment: Maintained.  Vertebrae: No fracture or focal lesion.  Paraspinal and other soft tissues: Extensive atherosclerotic vascular disease is present. The descending abdominal aorta is incompletely imaged but measures up to at least 3.5 cm transverse.  Disc levels: T12-L1:  Negative.  L1-2:  Mild facet degenerative change.  Otherwise negative.  L2-3:  Negative.  L3-4: There is a shallow disc bulge and moderate facet degenerative disease. The central canal is mildly narrowed. The neural foramina are open. The appearance is unchanged.  L4-5: Moderate facet degenerative disease, ligamentum flavum thickening and a shallow disc bulge cause moderate central canal stenosis. The foramina are open. Spondylosis shows some progression since the prior MRI.  L5-S1: Mild facet degenerative disease and a minimal disc bulge without stenosis.  IMPRESSION: Extensive atherosclerotic vascular disease with 3.5 cm descending abdominal aortic aneurysm. Recommend followup by ultrasound in 2 years. This recommendation follows ACR consensus guidelines: White Paper of the ACR Incidental Findings Committee II on Vascular Findings. J Am Coll Radiol 2013; 10:789-794.  Spondylosis most notable at L4-5 where there is a shallow disc bulge and ligamentum flavum thickening causing moderate central canal stenosis. Degenerative change shows some progression at this level since the prior MRI.  No change in mild central canal narrowing L3-4:   Electronically Signed   By: Inge Rise M.D.   On: 11/05/2016 12:58      PMFS History: Patient Active Problem List   Diagnosis Date Noted  . Anemia 10/02/2016  . Pain in joint, lower leg 10/03/2015  . Ecchymosis 09/23/2013  . PAD (peripheral artery disease) ([place]) 08/09/2013  . Aftercare following surgery of the circulatory system, [place]  02/01/2013  . Pain in limb-Left calf 02/01/2013  . Hyponatremia 08/21/2012  . Smoker 08/13/2012  . Other and unspecified alcohol dependence, unspecified drinking behavior 08/13/2012  . Diabetes ([place]) 08/13/2012  . Screening for prostate cancer 08/13/2012  . Encounter for long-term (current) use of other medications 08/13/2012  . Pain of thigh 06/15/2012  . Atherosclerosis of native artery of extremity with intermittent claudication ([place]) 08/21/2011  . ECZEMA 01/24/2010  . SEBACEOUS CYST 01/24/2009  . COUGH 01/24/2009  . AAA 12/06/2008  . HYPERCHOLESTEROLEMIA 01/21/2008  . ERECTILE DYSFUNCTION, ORGANIC 01/21/2008  . Back pain 01/21/2008  . 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 Anxiety state 03/07/2007  . ERECTILE DYSFUNCTION 03/07/2007  . SMOKER 03/07/2007  . DEPRESSION 03/07/2007  . Essential hypertension 03/07/2007  . CAROTID ARTERY STENOSIS 03/07/2007  . GERD 03/07/2007  . HIP PAIN, RIGHT 03/07/2007  . COLONIC POLYPS, HX OF 03/07/2007  . ADENOIDECTOMY, HX OF 03/07/2007   Past Medical History:  Diagnosis Date  . Abdominal aortic aneurysm (HCC)    3 cm aneurysm followed with serial ultrasound  . Arthritis    "in my feet" (10/13/2012)  . GERD (gastroesophageal reflux disease)   . H/O hiatal hernia   . Hepatic steatosis   . High cholesterol    "at one time; it's fine now" (10/13/2012)  . HTN (hypertension)   .  Hx of colonic polyps   . Hypothyroidism   . PAD (peripheral artery disease) ([place])   . Skin cancer    "burned them off my arm and such" (10/13/2012)    Family History  Problem Relation Age of Onset  . Hypertension Mother   . Hypertension Father   . Hypertension Sister   . Hypertension Sister     Past Surgical History:  Procedure Laterality Date  . ABDOMINAL ANGIOGRAM N/A 12/04/2011   Procedure: ABDOMINAL ANGIOGRAM;  Surgeon: Sherren Mocha, MD;  Location[facility] CATH LAB;  Service: Cardiovascular;  Laterality: N/A;  . ABDOMINAL AORTAGRAM N/A 10/13/2012   Procedure:  ABDOMINAL Maxcine Ham;  Surgeon: Serafina Mitchell, MD;  Location: [facility] CATH LAB;  Service: Cardiovascular;  Laterality: N/A;  . ANGIOPLASTY / STENTING FEMORAL Left 10/13/2012  . ESOPHAGOGASTRODUODENOSCOPY    . FEMORAL ENDARTERECTOMY Left 01/23/12   Left Endarterectomy  with bovie patch Angioplasy  . gated spect wall motion stress cardiolite  02/10/2002  . HIP FRACTURE SURGERY Right 1990's  . INGUINAL HERNIA REPAIR Right    "years ago" (10/13/2012)  . NOSE SURGERY    . TONSILLECTOMY     "I was a chld" (10/13/2012)   Social History   Occupational History  . retired     Therapist, music   Social History Main Topics  . Smoking status: Light Tobacco Smoker    Packs/day: 1.00    Years: 57.00    Types: Cigarettes  . Smokeless tobacco: Never Used     Comment: pt states there is no hope for quitting  . Alcohol use 3.6 oz/week    6 Cans of beer per week     Comment: 10/13/2012 "drink a 6 pack/wk"  . Drug use: No  . Sexual activity: No

## 2017-03-18 ENCOUNTER — Telehealth (INDEPENDENT_AMBULATORY_CARE_PROVIDER_SITE_OTHER): Payer: Self-pay | Admitting: Orthopaedic Surgery

## 2017-03-18 NOTE — Telephone Encounter (Signed)
See note from [place]. I have a surgery sheet on Ronnie Snyder to schedule lumbar decompress if his ABIs are normal.  Do you know who is supposed to be scheduling the ABIs?

## 2017-03-18 NOTE — Telephone Encounter (Signed)
Patient called stated you were supposed to schedule an appointment with [facility]. Please give patient a call per his request

## 2017-03-18 NOTE — Telephone Encounter (Signed)
Can you let me know about scheduling for ABI's and where it will be done? Thanks.

## 2017-03-19 NOTE — Telephone Encounter (Signed)
Pt called left vm stating that he was scheduled for his ABI on 03/24/17, he called the vas lab at [facility] and they told him that he had to call ordering doctor office to reschedule. I tried to call pt back left vm to return my call.

## 2017-03-24 ENCOUNTER — Ambulatory Visit (HOSPITAL_COMMUNITY)
Admission: RE | Admit: 2017-03-24 | Discharge: 2017-03-24 | Disposition: A | Discharge status: Home / Self Care | Payer: Medicare Other | Source: Ambulatory Visit | Attending: Orthopaedic Surgery | Admitting: Orthopaedic Surgery

## 2017-03-24 DIAGNOSIS — M5416 Radiculopathy, lumbar region: Secondary | ICD-10-CM | POA: Insufficient documentation

## 2017-03-24 DIAGNOSIS — I779 Disorder of arteries and arterioles, unspecified: Secondary | ICD-10-CM | POA: Diagnosis not present

## 2017-03-24 NOTE — Progress Notes (Signed)
VASCULAR LAB PRELIMINARY  ARTERIAL  ABI completed:ABIs are within normal limits.  No significant change since study done 08/16/15. 18mmhg difference between arm pressures    RIGHT    LEFT    PRESSURE WAVEFORM  PRESSURE WAVEFORM  BRACHIAL 147 T BRACHIAL 109 B  DP   DP    AT 128 M AT 149 1.01  PT 99 M PT 162 1.10  PER   PER    GREAT TOE  NA GREAT TOE  NA    RIGHT LEFT  ABI 0.87 1.10     Breea Loncar, RVT 03/24/2017, 9:46 AM

## 2017-03-28 ENCOUNTER — Telehealth (INDEPENDENT_AMBULATORY_CARE_PROVIDER_SITE_OTHER): Payer: Self-pay | Admitting: Orthopaedic Surgery

## 2017-03-28 ENCOUNTER — Telehealth (INDEPENDENT_AMBULATORY_CARE_PROVIDER_SITE_OTHER): Payer: Self-pay | Admitting: *Deleted

## 2017-03-28 NOTE — Telephone Encounter (Signed)
Patient requests surgery date on a Friday. 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

## 2017-03-28 NOTE — Telephone Encounter (Signed)
I called patient to advise that Dr. Lorin Mercy is in surgery this morning and I have sent previous message today to him. Patient states that Dr. Lorin Mercy has returned his call and that he is going to work on getting clearance from his physician to proceed with surgery. Patient requests surgery to be done on a Friday because he is the caretaker of his wife and he will have help at that time.

## 2017-03-28 NOTE — Telephone Encounter (Signed)
Hemmerich,Akari V 1940-11-21 [phone]    Pt needs Dr.yates to read test results. Pt stated he prefers his surgery on a Friday.Pt takes care of his wife.

## 2017-03-28 NOTE — Telephone Encounter (Signed)
Ok for one level L4-5 decompression for lumbar spinal stenosis. gen . Aneth. Prone chest rolls, intra-op xray, microscope burr,  Admit with  One night stay. ( Medicare admission only surgery) assist J.O.    1.5 hr surgery and total 2hrs.  Malachy Mood can do blue sheet with this info. Needs medical clearance.

## 2017-03-28 NOTE — Telephone Encounter (Signed)
Pt came to the office this am to find out why he has not heard about the results of his ABI, states he has been hurting and getting worse and would like to know what needs to be done whether he is needing to have surgery or not.   Please call pt with results. [phone]

## 2017-03-28 NOTE — Telephone Encounter (Signed)
Please advise 

## 2017-04-07 NOTE — Telephone Encounter (Signed)
Patient called stating that he is still waiting for surgery. He has also been falling and is in more pain. Is there clearance for the surgery? Patient says he needs to know something soon as he "cant take it any longer"  CB [phone]

## 2017-04-07 NOTE — Telephone Encounter (Signed)
Do you have surgery clearance on patient?

## 2017-04-08 NOTE — Telephone Encounter (Signed)
Ronnie Snyder is scheduled for surgery on Friday 04/25/2017 at  [facility].

## 2017-04-16 ENCOUNTER — Telehealth (INDEPENDENT_AMBULATORY_CARE_PROVIDER_SITE_OTHER): Payer: Self-pay | Admitting: Orthopaedic Surgery

## 2017-04-16 NOTE — Telephone Encounter (Signed)
Patient needs a note stating the procedure that will happen, recovery time, the date, etc. Patients son will need this information to get FMLA through his work because he will be the care taker. Would like to pick up 11/29 if possible. Phone # [phone]

## 2017-04-17 NOTE — Telephone Encounter (Signed)
See note, please advise and I can put in a letter for patient?  Thanks.

## 2017-04-17 NOTE — Telephone Encounter (Signed)
What do you estimate his recovery time to be?

## 2017-04-17 NOTE — Telephone Encounter (Signed)
Ok thanks 

## 2017-04-17 NOTE — Telephone Encounter (Signed)
Not sure if FLMA for son to be with him for a 5 days after surgery or for pt, thought he was retired. [employer]

## 2017-04-18 ENCOUNTER — Encounter (INDEPENDENT_AMBULATORY_CARE_PROVIDER_SITE_OTHER): Payer: Self-pay | Admitting: Radiology

## 2017-04-18 NOTE — Telephone Encounter (Signed)
Patient came in office, we discussed, it is for son, note done.

## 2017-04-21 ENCOUNTER — Encounter (HOSPITAL_COMMUNITY): Payer: Self-pay

## 2017-04-21 NOTE — Pre-Procedure Instructions (Signed)
Ronnie Snyder  04/21/2017      [facility], [place] [address] Phone: [phone] Fax: [phone]  [facility] [place], [place] - [address] AT [address] [employer] [place] [address] Phone: [phone] Fax: [phone]    Your procedure is scheduled on Friday 04/25/2017.  Report to [facility] Admitting at [place].M.  Call this number if you have problems the morning of surgery:  [phone]   Remember:  Do not eat food or drink liquids after midnight.  Take these medicines the morning of surgery with A SIP OF WATER: Acetaminophen (Tylenol) - if needed Alprazolam (xanax) - if needed Amlodipine (Norvasc) Levothyroxine (Synthroid)  7 days prior to surgery STOP taking any Aspirin (unless otherwise instructed by your surgeon), Aleve, Naproxen, Ibuprofen, Motrin, Advil, Goody's, BC's, all herbal medications, fish oil, and all vitamins - including ytour nabumetone (Relafen)  Follow your doctors instructions regarding your plavix.  If no instructions were given by your doctor, then you will need to call the prescribing office office to get instructions.      Do not wear jewelry.  Do not wear lotions, powders, or colognes, or deoderant.  Men may shave face and neck.  Do not bring valuables to the hospital.  [facility] is not responsible for any belongings or valuables.  Contacts, dentures or bridgework may not be worn into surgery.  Leave your suitcase in the car.  After surgery it may be brought to your room.  For patients admitted to the hospital, discharge time will be determined by your treatment team.  Patients discharged the day of surgery will not be allowed to drive home.   Name and phone number of your driver:    Special instructions:   [place]- Preparing For Surgery  Before surgery, you can play an  important role. Because skin is not sterile, your skin needs to be as free of germs as possible. You can reduce the number of germs on your skin by washing with CHG (chlorahexidine gluconate) Soap before surgery.  CHG is an antiseptic cleaner which kills germs and bonds with the skin to continue killing germs even after washing.  Please do not use if you have an allergy to CHG or antibacterial soaps. If your skin becomes reddened/irritated stop using the CHG.  Do not shave (including legs and underarms) for at least 48 hours prior to first CHG shower. It is OK to shave your face.  Please follow these instructions carefully.   1. Shower the NIGHT BEFORE SURGERY and the MORNING OF SURGERY with CHG.   2. If you chose to wash your hair, wash your hair first as usual with your normal shampoo.  3. After you shampoo, rinse your hair and body thoroughly to remove the shampoo.  4. Use CHG as you would any other liquid soap. You can apply CHG directly to the skin and wash gently with a scrungie or a clean washcloth.   5. Apply the CHG Soap to your body ONLY FROM THE NECK DOWN.  Do not use on open wounds or open sores. Avoid contact with your eyes, ears, mouth and genitals (private parts). Wash Face and genitals (private parts)  with your normal soap.  6. Wash thoroughly, paying special attention to the area where your surgery will be performed.  7. Thoroughly rinse  your body with warm water from the neck down.  8. DO NOT shower/wash with your normal soap after using and rinsing off the CHG Soap.  9. Pat yourself dry with a CLEAN TOWEL.  10. Wear CLEAN PAJAMAS to bed the night before surgery, wear comfortable clothes the morning of surgery  11. Place CLEAN SHEETS on your bed the night of your first shower and DO NOT SLEEP WITH PETS.    Day of Surgery: Shower as stated above. Do not apply any deodorants/lotions.  Please wear clean clothes to the hospital/surgery center.      Please read  over the following fact sheets that you were given.

## 2017-04-22 ENCOUNTER — Encounter (HOSPITAL_COMMUNITY)
Admission: RE | Admit: 2017-04-22 | Discharge: 2017-04-22 | Disposition: A | Discharge status: Home / Self Care | Payer: Medicare Other | Source: Ambulatory Visit | Attending: Orthopaedic Surgery | Admitting: Orthopaedic Surgery

## 2017-04-22 ENCOUNTER — Other Ambulatory Visit: Payer: Self-pay

## 2017-04-22 ENCOUNTER — Encounter (HOSPITAL_COMMUNITY): Payer: Self-pay

## 2017-04-22 DIAGNOSIS — Z79899 Other long term (current) drug therapy: Secondary | ICD-10-CM | POA: Diagnosis not present

## 2017-04-22 DIAGNOSIS — E7800 Pure hypercholesterolemia, unspecified: Secondary | ICD-10-CM | POA: Insufficient documentation

## 2017-04-22 DIAGNOSIS — F17210 Nicotine dependence, cigarettes, uncomplicated: Secondary | ICD-10-CM | POA: Diagnosis not present

## 2017-04-22 DIAGNOSIS — Z7982 Long term (current) use of aspirin: Secondary | ICD-10-CM | POA: Insufficient documentation

## 2017-04-22 DIAGNOSIS — I6529 Occlusion and stenosis of unspecified carotid artery: Secondary | ICD-10-CM | POA: Insufficient documentation

## 2017-04-22 DIAGNOSIS — F419 Anxiety disorder, unspecified: Secondary | ICD-10-CM | POA: Diagnosis not present

## 2017-04-22 DIAGNOSIS — I10 Essential (primary) hypertension: Secondary | ICD-10-CM | POA: Diagnosis not present

## 2017-04-22 DIAGNOSIS — Z01810 Encounter for preprocedural cardiovascular examination: Secondary | ICD-10-CM | POA: Diagnosis not present

## 2017-04-22 DIAGNOSIS — K219 Gastro-esophageal reflux disease without esophagitis: Secondary | ICD-10-CM | POA: Insufficient documentation

## 2017-04-22 DIAGNOSIS — E1151 Type 2 diabetes mellitus with diabetic peripheral angiopathy without gangrene: Secondary | ICD-10-CM | POA: Insufficient documentation

## 2017-04-22 DIAGNOSIS — M48062 Spinal stenosis, lumbar region with neurogenic claudication: Secondary | ICD-10-CM | POA: Diagnosis not present

## 2017-04-22 DIAGNOSIS — M5416 Radiculopathy, lumbar region: Secondary | ICD-10-CM | POA: Diagnosis not present

## 2017-04-22 LAB — CBC
HCT: 29.6 % — ABNORMAL LOW (ref 39.0–52.0)
Hemoglobin: 9.5 g/dL — ABNORMAL LOW (ref 13.0–17.0)
MCH: 27.5 pg (ref 26.0–34.0)
MCHC: 32.1 g/dL (ref 30.0–36.0)
MCV: 85.8 fL (ref 78.0–100.0)
PLATELETS: 257 10*3/uL (ref 150–400)
RBC: 3.45 MIL/uL — AB (ref 4.22–5.81)
RDW: 14.9 % (ref 11.5–15.5)
WBC: 5.6 10*3/uL (ref 4.0–10.5)

## 2017-04-22 LAB — APTT: aPTT: 30 s (ref 24–36)

## 2017-04-22 LAB — COMPREHENSIVE METABOLIC PANEL
ALT: 12 U/L — AB (ref 17–63)
AST: 18 U/L (ref 15–41)
Albumin: 3.8 g/dL (ref 3.5–5.0)
Alkaline Phosphatase: 121 U/L (ref 38–126)
Anion gap: 9 (ref 5–15)
BUN: 7 mg/dL (ref 6–20)
CHLORIDE: 96 mmol/L — AB (ref 101–111)
CO2: 26 mmol/L (ref 22–32)
CREATININE: 0.57 mg/dL — AB (ref 0.61–1.24)
Calcium: 8.9 mg/dL (ref 8.9–10.3)
GFR calc non Af Amer: >60 mL/min (ref >60)
Glucose, Bld: 104 mg/dL — ABNORMAL HIGH (ref 65–99)
POTASSIUM: 3.8 mmol/L (ref 3.5–5.1)
SODIUM: 131 mmol/L — AB (ref 135–145)
Total Bilirubin: 0.5 mg/dL (ref 0.3–1.2)
Total Protein: 6.4 g/dL — ABNORMAL LOW (ref 6.5–8.1)

## 2017-04-22 LAB — SURGICAL PCR SCREEN
MRSA, PCR: NEGATIVE
Staphylococcus aureus: NEGATIVE

## 2017-04-22 LAB — URINALYSIS, ROUTINE W REFLEX MICROSCOPIC
Bilirubin Urine: NEGATIVE
Glucose, UA: NEGATIVE mg/dL
Hgb urine dipstick: NEGATIVE
Ketones, ur: NEGATIVE mg/dL
Leukocytes, UA: NEGATIVE
Nitrite: NEGATIVE
Protein, ur: NEGATIVE mg/dL
Specific Gravity, Urine: 1.01 (ref 1.005–1.030)
pH: 7 (ref 5.0–8.0)

## 2017-04-22 LAB — PROTIME-INR
INR: 1.02
Prothrombin Time: 13.3 s (ref 11.4–15.2)

## 2017-04-22 NOTE — Progress Notes (Signed)
PCP - Dr. Loanne Drilling Cardiologist - patient denies  Chest x-ray - n/a EKG - 04/22/2017 Stress Test - 2003 ECHO - patient denies Cardiac Cath - patient denies  Sleep Study - patient denies   Blood Thinner Instructions: Patient stopped Plavix over 1 month ago because he was getting injections in his back  Anesthesia review: n/a  Patient denies shortness of breath, fever, cough and chest pain at PAT appointment   Patient verbalized understanding of instructions that were given to them at the PAT appointment. Patient was also instructed that they will need to review over the PAT instructions again at home before surgery.

## 2017-04-23 ENCOUNTER — Telehealth: Payer: Self-pay | Admitting: Endocrinology

## 2017-04-23 ENCOUNTER — Encounter (HOSPITAL_COMMUNITY): Payer: Self-pay | Admitting: Vascular Surgery

## 2017-04-23 ENCOUNTER — Encounter (HOSPITAL_COMMUNITY): Payer: Self-pay

## 2017-04-23 NOTE — Progress Notes (Signed)
Anesthesia Chart Review: Patient is a 76 year old male scheduled for L4-5 decompression on 04/25/17 by Dr. Rodell Perna.   History includes smoking, GERD, HTN, hepatic steatosis, AAA (3.5 cm by 11/05/16 CT L-spine), hypothyroidism, PAD (s/p left iliofemoral endarterectomy 01/23/12; left SFA/popliteal atherectomy and left SFA stent 10/13/12), hypercholesterolemia, hiatal hernia, skin cancer, nose surgery, tonsillectomy, right inguinal hernia repair, right hip fracture (s/p repair '90's).   - PCP is Dr. Renato Shin (Endocrinologist). - He is not followed by cardiology, but saw Dr. Sherren Mocha for PAD in 2013 and was referred to vascular surgery for femoral endarterectomy. Vascular surgeon is Dr. Harold Barban. Last visit with Clemon Chambers, NP on 07/20/15. (According to 12/30/11 initial [facility] note for PAD, patient's AAA was not being followed at their office.)   Meds include Xanax, amlodipine, Plavix (not taking X 1 month due to back injections; was on for PAD), levothyroxine, losartan-HCTZ, Relafen.   BP (!) 152/77   Pulse 69   Temp 36.7 C   Resp 18   Ht 5' 6.5" (1.689 m)   Wt 133 lb (60.3 kg)   SpO2 100%   BMI 21.15 kg/m   EKG 04/24/17: NSR.  Prior stress test in 2003.   Abdominal aorta U/S 02/25/11: Impressions:  Stable dimensions of infrarenal fusiform AAA, at 3.0 cm x 3.0 cm. Normal caliber right CIA. Stable dimensions of distal left CIA aneurysm (1.6 cm X 1.8 cm).  (I don't see designated AAA imaging since then other than 10/13/12 aortogram showed, "Aneurysmal degeneration of the infrarenal abdominal aorta which cannot be classified but this image is visualized. Possible left common iliac aneurysmal degeneration." However CT L-spine on 11/05/16 showed: IMPRESSION: - Extensive atherosclerotic vascular disease with 3.5 cm descending abdominal aortic aneurysm. Recommend followup by ultrasound in 2 years. This recommendation follows ACR consensus guidelines: White Paper of the ACR Incidental  Findings Committee II on Vascular Findings. J Am Coll Radiol 2013; 10:789-794. - Spondylosis most notable at L4-5 where there is a shallow disc bulge and ligamentum flavum thickening causing moderate central canal stenosis. Degenerative change shows some progression at this level since the prior MRI. - No change in mild central canal narrowing L3-4: (I will forward CT report to Dr. Loanne Drilling as I believe he is the one monitoring patient's AAA.)  Preoperative labs noted. Na 131, K 3.8, glucose 104, Cr 0.57. H/H 9.5/29.6 (down from 12.1/36.9). PT/PTT WNL. A1c on 10/02/16 was 5.8. Mr. Gerke is being evaluated by Dr. Loanne Drilling on 04/24/17 for anemia, and may ultimately be rescheduled for surgery.  [facility] Short Stay Center/Anesthesiology Phone [phone] 04/23/2017 1:11 PM

## 2017-04-23 NOTE — Telephone Encounter (Addendum)
Dr Lorin Mercy stated patient is anemic, and he has to have surgery and can't until he has his blood drawn, to see what's going on and why he is anemic, he needs blood drawn today. please call patient

## 2017-04-23 NOTE — Telephone Encounter (Signed)
Ov tomorrow 8 AM

## 2017-04-23 NOTE — Telephone Encounter (Signed)
Appointment made for tomorrow 8am.

## 2017-04-24 ENCOUNTER — Encounter: Payer: Self-pay | Admitting: Endocrinology

## 2017-04-24 ENCOUNTER — Ambulatory Visit (INDEPENDENT_AMBULATORY_CARE_PROVIDER_SITE_OTHER): Payer: Medicare Other | Admitting: Endocrinology

## 2017-04-24 VITALS — BP 140/78 | HR 68 | Wt 136.2 lb

## 2017-04-24 DIAGNOSIS — D649 Anemia, unspecified: Secondary | ICD-10-CM | POA: Diagnosis not present

## 2017-04-24 DIAGNOSIS — E1151 Type 2 diabetes mellitus with diabetic peripheral angiopathy without gangrene: Secondary | ICD-10-CM | POA: Diagnosis not present

## 2017-04-24 DIAGNOSIS — I714 Abdominal aortic aneurysm, without rupture, unspecified: Secondary | ICD-10-CM

## 2017-04-24 LAB — CBC WITH DIFFERENTIAL/PLATELET
BASOS PCT: 1.4 % (ref 0.0–3.0)
Basophils Absolute: 0.1 10*3/uL (ref 0.0–0.1)
EOS ABS: 0.2 10*3/uL (ref 0.0–0.7)
EOS PCT: 3.7 % (ref 0.0–5.0)
HEMATOCRIT: 30.9 % — AB (ref 39.0–52.0)
HEMOGLOBIN: 10.2 g/dL — AB (ref 13.0–17.0)
LYMPHS PCT: 30.9 % (ref 12.0–46.0)
Lymphs Abs: 1.7 10*3/uL (ref 0.7–4.0)
MCHC: 32.9 g/dL (ref 30.0–36.0)
MCV: 84.8 fl (ref 78.0–100.0)
MONOS PCT: 9.8 % (ref 3.0–12.0)
Monocytes Absolute: 0.5 10*3/uL (ref 0.1–1.0)
Neutro Abs: 3 10*3/uL (ref 1.4–7.7)
Neutrophils Relative %: 54.2 % (ref 43.0–77.0)
Platelets: 246 10*3/uL (ref 150.0–400.0)
RBC: 3.64 Mil/uL — ABNORMAL LOW (ref 4.22–5.81)
RDW: 14.9 % (ref 11.5–15.5)
WBC: 5.6 10*3/uL (ref 4.0–10.5)

## 2017-04-24 LAB — POCT GLYCOSYLATED HEMOGLOBIN (HGB A1C): HEMOGLOBIN A1C: 5.4

## 2017-04-24 LAB — IBC PANEL
Iron: 28 ug/dL — ABNORMAL LOW (ref 42–165)
Saturation Ratios: 5.4 % — ABNORMAL LOW (ref 20.0–50.0)
Transferrin: 367 mg/dL — ABNORMAL HIGH (ref 212.0–360.0)

## 2017-04-24 MED ORDER — LOSARTAN POTASSIUM 100 MG PO TABS: 100.0000 mg | ORAL_TABLET | Freq: Every day | ORAL | 3 refills | Status: DC

## 2017-04-24 MED ORDER — POTASSIUM CHLORIDE CRYS ER 20 MEQ PO TBCR: 20.0000 meq | EXTENDED_RELEASE_TABLET | Freq: Every day | ORAL | 0 refills | Status: DC
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
## 2017-04-24 NOTE — Patient Instructions (Addendum)
blood tests are requested for you today.  We'll let you know about the results. I have sent a prescription to your pharmacy, to change losartan-HCTZ to just losartan.   Also, I have sent a prescription to your pharmacy, to take potassium daily x 1 week, then you are done.  Please come back for a follow-up appointment in 10 days.   Let's recheck the aorta ultrasound.  you will receive a phone call, about a day and time for an appointment.

## 2017-04-25 ENCOUNTER — Inpatient Hospital Stay (HOSPITAL_COMMUNITY): Admission: RE | Admit: 2017-04-25 | Payer: Medicare Other | Source: Ambulatory Visit | Admitting: Orthopaedic Surgery

## 2017-04-25 ENCOUNTER — Encounter (HOSPITAL_COMMUNITY): Admission: RE | Payer: Self-pay | Source: Ambulatory Visit

## 2017-04-25 SURGERY — LUMBAR LAMINECTOMY/DECOMPRESSION MICRODISCECTOMY
Anesthesia: General

## 2017-04-29 ENCOUNTER — Encounter: Payer: Self-pay | Admitting: Endocrinology

## 2017-04-30 ENCOUNTER — Ambulatory Visit (HOSPITAL_COMMUNITY)
Admission: RE | Admit: 2017-04-30 | Discharge: 2017-04-30 | Disposition: A | Discharge status: Home / Self Care | Payer: Medicare Other | Source: Ambulatory Visit | Attending: Vascular Surgery | Admitting: Vascular Surgery

## 2017-04-30 ENCOUNTER — Encounter: Payer: Self-pay | Admitting: Endocrinology

## 2017-04-30 DIAGNOSIS — I714 Abdominal aortic aneurysm, without rupture, unspecified: Secondary | ICD-10-CM

## 2017-05-05 ENCOUNTER — Other Ambulatory Visit: Payer: Self-pay | Admitting: Endocrinology

## 2017-05-05 ENCOUNTER — Encounter: Payer: Self-pay | Admitting: Endocrinology

## 2017-05-06 ENCOUNTER — Telehealth (INDEPENDENT_AMBULATORY_CARE_PROVIDER_SITE_OTHER): Payer: Self-pay | Admitting: *Deleted

## 2017-05-06 NOTE — Telephone Encounter (Signed)
U/s he says is 3.7 AAA. Sees Dr. Loanne Drilling in AM . If cleared can put back on.

## 2017-05-06 NOTE — Telephone Encounter (Signed)
Pt called stating he had his US done last Friday and would like to know the results and when he will be scheduled for Surgery. Please advise.

## 2017-05-06 NOTE — Telephone Encounter (Signed)
Please advise 

## 2017-05-07 ENCOUNTER — Ambulatory Visit: Payer: Medicare Other | Admitting: Endocrinology

## 2017-05-07 ENCOUNTER — Ambulatory Visit (INDEPENDENT_AMBULATORY_CARE_PROVIDER_SITE_OTHER): Payer: Medicare Other | Admitting: Endocrinology

## 2017-05-07 ENCOUNTER — Encounter: Payer: Self-pay | Admitting: Endocrinology

## 2017-05-07 VITALS — BP 138/78 | HR 75 | Wt 137.2 lb

## 2017-05-07 DIAGNOSIS — E871 Hypo-osmolality and hyponatremia: Secondary | ICD-10-CM

## 2017-05-07 DIAGNOSIS — D509 Iron deficiency anemia, unspecified: Secondary | ICD-10-CM | POA: Diagnosis not present

## 2017-05-07 DIAGNOSIS — Z23 Encounter for immunization: Secondary | ICD-10-CM

## 2017-05-07 LAB — BASIC METABOLIC PANEL
BUN: 12 mg/dL (ref 6–23)
CO2: 29 mEq/L (ref 19–32)
CREATININE: 0.57 mg/dL (ref 0.40–1.50)
Calcium: 9 mg/dL (ref 8.4–10.5)
Chloride: 97 mEq/L (ref 96–112)
GFR: 147.91 mL/min (ref >60.00)
Glucose, Bld: 84 mg/dL (ref 70–99)
POTASSIUM: 4.4 meq/L (ref 3.5–5.1)
SODIUM: 133 meq/L — AB (ref 135–145)

## 2017-05-07 LAB — CBC WITH DIFFERENTIAL/PLATELET
Basophils Absolute: 0.1 10*3/uL (ref 0.0–0.1)
Basophils Relative: 1.1 % (ref 0.0–3.0)
EOS PCT: 2.7 % (ref 0.0–5.0)
Eosinophils Absolute: 0.2 10*3/uL (ref 0.0–0.7)
HEMATOCRIT: 29.6 % — AB (ref 39.0–52.0)
Hemoglobin: 9.6 g/dL — ABNORMAL LOW (ref 13.0–17.0)
LYMPHS ABS: 1.4 10*3/uL (ref 0.7–4.0)
LYMPHS PCT: 16.3 % (ref 12.0–46.0)
MCHC: 32.5 g/dL (ref 30.0–36.0)
MCV: 83.5 fl (ref 78.0–100.0)
MONOS PCT: 7.2 % (ref 3.0–12.0)
Monocytes Absolute: 0.6 10*3/uL (ref 0.1–1.0)
NEUTROS ABS: 6.2 10*3/uL (ref 1.4–7.7)
NEUTROS PCT: 72.7 % (ref 43.0–77.0)
Platelets: 227 10*3/uL (ref 150.0–400.0)
RBC: 3.54 Mil/uL — AB (ref 4.22–5.81)
RDW: 14.9 % (ref 11.5–15.5)
WBC: 8.5 10*3/uL (ref 4.0–10.5)

## 2017-05-07 LAB — IBC PANEL
Iron: 29 ug/dL — ABNORMAL LOW (ref 42–165)
Saturation Ratios: 5.8 % — ABNORMAL LOW (ref 20.0–50.0)
Transferrin: 355 mg/dL (ref 212.0–360.0)

## 2017-05-07 NOTE — Patient Instructions (Addendum)
blood tests are requested for you today.  We'll let you know about the results.   If these are good, you are good to go for the surgery.   Please come back for a follow-up appointment in 5 months, as scheduled.
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
## 2017-05-08 NOTE — Telephone Encounter (Signed)
I called Ronnie Snyder to discuss.  He has been cleared by Dr. Loanne Drilling.  He would like to reschedule surgery for some time the first part of January.  Has some transportation issues and would need dates coordinated around his son's schedule.  Advised that I would look for possible dates and call him back to discuss what is available.

## 2017-05-16 ENCOUNTER — Other Ambulatory Visit (INDEPENDENT_AMBULATORY_CARE_PROVIDER_SITE_OTHER): Payer: Self-pay | Admitting: Orthopedic Surgery

## 2017-05-16 ENCOUNTER — Telehealth (INDEPENDENT_AMBULATORY_CARE_PROVIDER_SITE_OTHER): Payer: Self-pay | Admitting: Orthopedic Surgery

## 2017-05-16 NOTE — Telephone Encounter (Signed)
Pt called and left VM pertaining to surgery

## 2017-05-16 NOTE — Telephone Encounter (Signed)
I spoke with Ronnie Snyder.  We rescheduled surgery for Friday 05/23/2017 at [place].  All surgery information given.

## 2017-05-22 ENCOUNTER — Encounter (HOSPITAL_COMMUNITY): Payer: Self-pay | Admitting: *Deleted

## 2017-05-22 ENCOUNTER — Other Ambulatory Visit: Payer: Self-pay

## 2017-05-22 NOTE — H&P (Signed)
Ronnie Snyder is an 77 y.o. male.   Chief Complaint: back pain and radiculopathy HPI: patient with hx of L4-5 stenosis and above complaint presents for surgical intervention.  Progressively worsening symptoms.  Failed conservative treatment.    Past Medical History:  Diagnosis Date  . Abdominal aortic aneurysm (HCC)    3.5 cm 10/2016 L-spine CT (previously 3.0 cm by U/S 02/25/11)  . Anxiety   . Arthritis    "in my feet" (10/13/2012)  . GERD (gastroesophageal reflux disease)   . H/O hiatal hernia   . Hepatic steatosis   . High cholesterol    "at one time; it's fine now" (10/13/2012)  . HTN (hypertension)   . Hx of colonic polyps   . Hypothyroidism   . PAD (peripheral artery disease) ([place])   . Skin cancer    "burned them off my arm and such" (10/13/2012)    Past Surgical History:  Procedure Laterality Date  . ABDOMINAL ANGIOGRAM N/A 12/04/2011   Procedure: ABDOMINAL ANGIOGRAM;  Surgeon: Sherren Mocha, MD;  Location: [facility] CATH LAB;  Service: Cardiovascular;  Laterality: N/A;  . ABDOMINAL AORTAGRAM N/A 10/13/2012   Procedure: ABDOMINAL Maxcine Ham;  Surgeon: Serafina Mitchell, MD;  Location: [facility] CATH LAB;  Service: Cardiovascular;  Laterality: N/A;  . ANGIOPLASTY / STENTING FEMORAL Left 10/13/2012  . ESOPHAGOGASTRODUODENOSCOPY    . FEMORAL ENDARTERECTOMY Left 01/23/12   Left Endarterectomy  with bovie patch Angioplasy  . gated spect wall motion stress cardiolite  02/10/2002  . HIP FRACTURE SURGERY Right 1990's  . INGUINAL HERNIA REPAIR Right    "years ago" (10/13/2012)  . NOSE SURGERY    . TONSILLECTOMY     "I was a chld" (10/13/2012)    Family History  Problem Relation Age of Onset  . Hypertension Mother   . Hypertension Father   . Hypertension Sister   . Hypertension Sister    Social History:  reports that he has been smoking cigarettes.  He has a 14.25 pack-year smoking history. he has never used smokeless tobacco. He reports that he drinks about 3.0 oz of alcohol per week. He  reports that he does not use drugs.  Allergies:  Allergies  Allergen Reactions  . Other     UNSPECIFIED REACTION  "ALL PAINS/NARCOTIC MEDS-CANNOT TOLERATE WELL" > per Med History prior to 05/23/17  . Atorvastatin Nausea Only  . Penicillins Rash    Has patient had a PCN reaction causing immediate rash, facial/tongue/throat swelling, SOB or lightheadedness with hypotension: Unknown Has patient had a PCN reaction causing severe rash involving mucus membranes or skin necrosis: No Has patient had a PCN reaction that required hospitalization: No Has patient had a PCN reaction occurring within the last 10 years: No If all of the above answers are "NO", then may proceed with Cephalosporin use.   . Pioglitazone Nausea Only  . Vicodin [Hydrocodone-Acetaminophen] Nausea Only    No medications prior to admission.    No results found for this or any previous visit (from the past 48 hour(s)). No results found.  ROS  There were no vitals taken for this visit. Physical Exam  Constitutional: He is oriented to person, place, and time. He appears well-developed. No distress.  HENT:  Head: Normocephalic and atraumatic.  Eyes: Pupils are equal, round, and reactive to light.  Neck: Normal range of motion.  Respiratory: No respiratory distress.  GI: He exhibits no distension.  Musculoskeletal: He exhibits tenderness.  Ortho Exam patient alert and oriented no pain with hip range  of motion. The no inguinal lymphadenopathy. Knee reaches full extension. Anterior tib EHL is intact. Patient ambulates with a cane. Well-healed incision from left femoropopliteal stent.  Neurological: He is alert and oriented to person, place, and time.  Skin: Skin is warm and dry.  Psychiatric: He has a normal mood and affect.     Assessment/Plan L4-5 stenosis   Will proceed with L4-5 decompression as scheduled.  Procedure along with possible risks and complications discussed.  All questions answered.     Benjiman Core, PA-C 05/22/2017, 4:43 PM

## 2017-05-22 NOTE — Progress Notes (Signed)
Anesthesia Follow-Up: SAME DAY WORK-UP.   Chart Review: Patient is a 77 year old male scheduled for L4-5 decompression on 05/23/17 by Dr. Rodell Perna. Procedure was initially scheduled for 04/25/17, but was postponed until patient could be re-evaluated by his PCP for anemia. Since his last PAT, patient has seen Dr. Loanne Drilling twice, last on 05/07/17. BID iron supplements were recommended for iron-deficiency anemia. He wrote, "His surgical risk is low and outweighed by the potential benefit of the surgery.  he is therefore medically cleared."  History includes smoking, GERD, HTN, hepatic steatosis, AAA (3.5 cm by 11/05/16 CT L-spine; 3.7 X 3.6 cm by 04/30/17 U/S; left CIA 2.0 cm), hypothyroidism, PAD (s/p left iliofemoral endarterectomy 01/23/12; left SFA/popliteal atherectomy and left SFA stent 10/13/12), hypercholesterolemia, hiatal hernia, skin cancer, nose surgery, tonsillectomy, right inguinal hernia repair, right hip fracture (s/p repair '90's).   - PCP is Dr. Renato Shin (Endocrinologist).  - He is not followed by cardiology, but saw Dr. Sherren Mocha for PAD in 2013 and was referred to vascular surgery for femoral endarterectomy. Vascular surgeon is Dr. Harold Barban. Last visit with Clemon Chambers, NP on 07/20/15. (According to 12/30/11 initial [facility] note for PAD, patient's AAA was not being followed at their office.)   Meds include Xanax, amlodipine, Plavix (not taking X 1 month due to back injections; was on for PAD), levothyroxine, losartan, Relafen.   EKG 04/22/17: NSR.  Prior stress test in 2003.   Abdominal aorta U/S 04/30/17: Impressions:  Abdominal aortic aneurysm documented with maximum diameter of 3.7 cm X 3.6 cm. Left common iliac artery has a maximum diameter of 2.0 cm.  Labs from 04/22/17-05/07/17 noted. HGB 9.5-10.2, HCT 29.6-30.9. Cr 0.57. Glucose 84-104. Iron low at 28-29 with saturation rates 5.4-5.8. Transferrin 367-355. A1c on 10/02/16 was 5.8. He will need updated labs prior to  surgery. Anticipate need for atleast T&S since last few HGB have been 9-10 range.   George Hugh [facility] Short Stay Center/Anesthesiology Phone [phone] 05/22/2017 10:39 AM

## 2017-05-22 NOTE — Progress Notes (Signed)
Spoke with pt for pre-op call. Pt verifies that nothing has changed with his allergies, medications, medical and surgical history since he was seen here for a PAT appt on 04/22/17. Pt states he stopped his Plavix prior to to 04/22/17 because surgery was scheduled for the 7th. States he hasn't started it back since he knew the surgery was going to be rescheduled.

## 2017-05-23 ENCOUNTER — Inpatient Hospital Stay (HOSPITAL_COMMUNITY): Payer: Medicare Other | Admitting: Vascular Surgery

## 2017-05-23 ENCOUNTER — Encounter (HOSPITAL_COMMUNITY): Payer: Self-pay | Admitting: Certified Registered Nurse Anesthetist

## 2017-05-23 ENCOUNTER — Encounter (HOSPITAL_COMMUNITY)
Admission: RE | Disposition: A | Discharge status: Home / Self Care | Payer: Self-pay | Source: Ambulatory Visit | Attending: Orthopaedic Surgery

## 2017-05-23 ENCOUNTER — Observation Stay (HOSPITAL_COMMUNITY)
Admission: RE | Admit: 2017-05-23 | Discharge: 2017-05-24 | Disposition: A | Discharge status: Home / Self Care | Payer: Medicare Other | Source: Ambulatory Visit | Attending: Orthopaedic Surgery | Admitting: Orthopaedic Surgery

## 2017-05-23 ENCOUNTER — Inpatient Hospital Stay (HOSPITAL_COMMUNITY): Payer: Medicare Other

## 2017-05-23 DIAGNOSIS — M19072 Primary osteoarthritis, left ankle and foot: Secondary | ICD-10-CM | POA: Diagnosis not present

## 2017-05-23 DIAGNOSIS — I714 Abdominal aortic aneurysm, without rupture: Secondary | ICD-10-CM | POA: Insufficient documentation

## 2017-05-23 DIAGNOSIS — F17210 Nicotine dependence, cigarettes, uncomplicated: Secondary | ICD-10-CM | POA: Insufficient documentation

## 2017-05-23 DIAGNOSIS — E7800 Pure hypercholesterolemia, unspecified: Secondary | ICD-10-CM | POA: Diagnosis not present

## 2017-05-23 DIAGNOSIS — F419 Anxiety disorder, unspecified: Secondary | ICD-10-CM | POA: Diagnosis not present

## 2017-05-23 DIAGNOSIS — K760 Fatty (change of) liver, not elsewhere classified: Secondary | ICD-10-CM | POA: Diagnosis not present

## 2017-05-23 DIAGNOSIS — Z888 Allergy status to other drugs, medicaments and biological substances status: Secondary | ICD-10-CM | POA: Diagnosis not present

## 2017-05-23 DIAGNOSIS — I10 Essential (primary) hypertension: Secondary | ICD-10-CM | POA: Diagnosis not present

## 2017-05-23 DIAGNOSIS — K219 Gastro-esophageal reflux disease without esophagitis: Secondary | ICD-10-CM | POA: Insufficient documentation

## 2017-05-23 DIAGNOSIS — Z85828 Personal history of other malignant neoplasm of skin: Secondary | ICD-10-CM | POA: Diagnosis not present

## 2017-05-23 DIAGNOSIS — F329 Major depressive disorder, single episode, unspecified: Secondary | ICD-10-CM | POA: Insufficient documentation

## 2017-05-23 DIAGNOSIS — M48062 Spinal stenosis, lumbar region with neurogenic claudication: Secondary | ICD-10-CM | POA: Diagnosis not present

## 2017-05-23 DIAGNOSIS — E039 Hypothyroidism, unspecified: Secondary | ICD-10-CM | POA: Insufficient documentation

## 2017-05-23 DIAGNOSIS — M4326 Fusion of spine, lumbar region: Secondary | ICD-10-CM | POA: Diagnosis not present

## 2017-05-23 DIAGNOSIS — M48061 Spinal stenosis, lumbar region without neurogenic claudication: Secondary | ICD-10-CM | POA: Diagnosis not present

## 2017-05-23 DIAGNOSIS — Z880 Allergy status to penicillin: Secondary | ICD-10-CM | POA: Diagnosis not present

## 2017-05-23 DIAGNOSIS — Z79899 Other long term (current) drug therapy: Secondary | ICD-10-CM | POA: Diagnosis not present

## 2017-05-23 DIAGNOSIS — Z7902 Long term (current) use of antithrombotics/antiplatelets: Secondary | ICD-10-CM | POA: Insufficient documentation

## 2017-05-23 DIAGNOSIS — I6529 Occlusion and stenosis of unspecified carotid artery: Secondary | ICD-10-CM | POA: Diagnosis not present

## 2017-05-23 DIAGNOSIS — Z885 Allergy status to narcotic agent status: Secondary | ICD-10-CM | POA: Diagnosis not present

## 2017-05-23 DIAGNOSIS — M19071 Primary osteoarthritis, right ankle and foot: Secondary | ICD-10-CM | POA: Diagnosis not present

## 2017-05-23 DIAGNOSIS — E1151 Type 2 diabetes mellitus with diabetic peripheral angiopathy without gangrene: Secondary | ICD-10-CM | POA: Diagnosis not present

## 2017-05-23 DIAGNOSIS — Z419 Encounter for procedure for purposes other than remedying health state, unspecified: Secondary | ICD-10-CM

## 2017-05-23 HISTORY — PX: LUMBAR LAMINECTOMY/DECOMPRESSION MICRODISCECTOMY: SHX5026

## 2017-05-23 LAB — BASIC METABOLIC PANEL
ANION GAP: 10 (ref 5–15)
BUN: 11 mg/dL (ref 6–20)
CO2: 24 mmol/L (ref 22–32)
Calcium: 9.1 mg/dL (ref 8.9–10.3)
Chloride: 98 mmol/L — ABNORMAL LOW (ref 101–111)
Creatinine, Ser: 0.59 mg/dL — ABNORMAL LOW (ref 0.61–1.24)
GLUCOSE: 99 mg/dL (ref 65–99)
POTASSIUM: 4 mmol/L (ref 3.5–5.1)
Sodium: 132 mmol/L — ABNORMAL LOW (ref 135–145)

## 2017-05-23 LAB — CBC
HCT: 31.7 % — ABNORMAL LOW (ref 39.0–52.0)
Hemoglobin: 9.6 g/dL — ABNORMAL LOW (ref 13.0–17.0)
MCH: 25.4 pg — ABNORMAL LOW (ref 26.0–34.0)
MCHC: 30.3 g/dL (ref 30.0–36.0)
MCV: 83.9 fL (ref 78.0–100.0)
PLATELETS: 271 10*3/uL (ref 150–400)
RBC: 3.78 MIL/uL — AB (ref 4.22–5.81)
RDW: 14.2 % (ref 11.5–15.5)
WBC: 6.6 10*3/uL (ref 4.0–10.5)

## 2017-05-23 SURGERY — LUMBAR LAMINECTOMY/DECOMPRESSION MICRODISCECTOMY
Anesthesia: General

## 2017-05-23 MED ORDER — EPHEDRINE SULFATE 50 MG/ML IJ SOLN
INTRAMUSCULAR | Status: DC | PRN
  Administered 2017-05-23 (×2): 5 mg via INTRAVENOUS

## 2017-05-23 MED ORDER — CHLORHEXIDINE GLUCONATE 4 % EX LIQD: 60.0000 mL | Freq: Once | CUTANEOUS | Status: DC

## 2017-05-23 MED ORDER — ONDANSETRON HCL 4 MG/2ML IJ SOLN
INTRAMUSCULAR | Status: DC | PRN
  Administered 2017-05-23: 4 mg via INTRAVENOUS

## 2017-05-23 MED ORDER — OXYCODONE HCL 5 MG PO TABS
5.0000 mg | ORAL_TABLET | ORAL | Status: DC | PRN
  Administered 2017-05-23: 5 mg via ORAL
  Filled 2017-05-23: qty 1

## 2017-05-23 MED ORDER — DEXAMETHASONE SODIUM PHOSPHATE 10 MG/ML IJ SOLN
INTRAMUSCULAR | Status: DC | PRN
  Administered 2017-05-23: 10 mg via INTRAVENOUS

## 2017-05-23 MED ORDER — METHOCARBAMOL 1000 MG/10ML IJ SOLN: 500.0000 mg | Freq: Four times a day (QID) | INTRAVENOUS | Status: DC | PRN

## 2017-05-23 MED ORDER — DOCUSATE SODIUM 100 MG PO CAPS
100.0000 mg | ORAL_CAPSULE | Freq: Two times a day (BID) | ORAL | Status: DC
  Administered 2017-05-23: 100 mg via ORAL
  Filled 2017-05-23: qty 1

## 2017-05-23 MED ORDER — SODIUM CHLORIDE 0.9% FLUSH: 3.0000 mL | INTRAVENOUS | Status: DC | PRN

## 2017-05-23 MED ORDER — THROMBIN (RECOMBINANT) 5000 UNITS EX SOLR
CUTANEOUS | Status: AC
  Filled 2017-05-23: qty 10000

## 2017-05-23 MED ORDER — PROPOFOL 10 MG/ML IV BOLUS
INTRAVENOUS | Status: DC | PRN
  Administered 2017-05-23: 130 mg via INTRAVENOUS

## 2017-05-23 MED ORDER — AMLODIPINE BESYLATE 5 MG PO TABS: 5.0000 mg | ORAL_TABLET | Freq: Every day | ORAL | Status: DC

## 2017-05-23 MED ORDER — OXYCODONE-ACETAMINOPHEN 5-325 MG PO TABS: 1.0000 | ORAL_TABLET | Freq: Four times a day (QID) | ORAL | 0 refills | Status: DC | PRN

## 2017-05-23 MED ORDER — VANCOMYCIN HCL IN DEXTROSE 1-5 GM/200ML-% IV SOLN
1000.0000 mg | INTRAVENOUS | Status: AC
  Administered 2017-05-23 (×2): 1000 mg via INTRAVENOUS
  Filled 2017-05-23: qty 200

## 2017-05-23 MED ORDER — FENTANYL CITRATE (PF) 100 MCG/2ML IJ SOLN
INTRAMUSCULAR | Status: DC | PRN
  Administered 2017-05-23 (×3): 50 ug via INTRAVENOUS

## 2017-05-23 MED ORDER — PROMETHAZINE HCL 25 MG/ML IJ SOLN: 6.2500 mg | INTRAMUSCULAR | Status: DC | PRN

## 2017-05-23 MED ORDER — PHENYLEPHRINE HCL 10 MG/ML IJ SOLN
INTRAMUSCULAR | Status: DC | PRN
  Administered 2017-05-23: 80 ug via INTRAVENOUS

## 2017-05-23 MED ORDER — METHOCARBAMOL 500 MG PO TABS: 500.0000 mg | ORAL_TABLET | Freq: Four times a day (QID) | ORAL | 0 refills | Status: DC | PRN

## 2017-05-23 MED ORDER — SODIUM CHLORIDE 0.9 % IV SOLN: INTRAVENOUS | Status: DC

## 2017-05-23 MED ORDER — FENTANYL CITRATE (PF) 100 MCG/2ML IJ SOLN: INTRAMUSCULAR | Status: DC | PRN

## 2017-05-23 MED ORDER — PHENOL 1.4 % MT LIQD: 1.0000 | OROMUCOSAL | Status: DC | PRN

## 2017-05-23 MED ORDER — PROPOFOL 10 MG/ML IV BOLUS
INTRAVENOUS | Status: AC
  Filled 2017-05-23: qty 20

## 2017-05-23 MED ORDER — SODIUM CHLORIDE 0.9% FLUSH
3.0000 mL | Freq: Two times a day (BID) | INTRAVENOUS | Status: DC
  Administered 2017-05-23 (×2): 3 mL via INTRAVENOUS

## 2017-05-23 MED ORDER — LACTATED RINGERS IV SOLN
INTRAVENOUS | Status: DC
  Administered 2017-05-23 (×2): via INTRAVENOUS

## 2017-05-23 MED ORDER — SUGAMMADEX SODIUM 500 MG/5ML IV SOLN
INTRAVENOUS | Status: DC | PRN
  Administered 2017-05-23: 200 mg via INTRAVENOUS

## 2017-05-23 MED ORDER — METHOCARBAMOL 500 MG PO TABS
500.0000 mg | ORAL_TABLET | Freq: Four times a day (QID) | ORAL | Status: DC | PRN
  Administered 2017-05-23: 500 mg via ORAL
  Filled 2017-05-23: qty 1

## 2017-05-23 MED ORDER — MIDAZOLAM HCL 5 MG/5ML IJ SOLN: INTRAMUSCULAR | Status: DC | PRN

## 2017-05-23 MED ORDER — LIDOCAINE HCL (CARDIAC) 20 MG/ML IV SOLN
INTRAVENOUS | Status: DC | PRN
  Administered 2017-05-23: 60 mg via INTRAVENOUS

## 2017-05-23 MED ORDER — ONDANSETRON HCL 4 MG PO TABS: 4.0000 mg | ORAL_TABLET | Freq: Four times a day (QID) | ORAL | Status: DC | PRN

## 2017-05-23 MED ORDER — POLYETHYLENE GLYCOL 3350 17 G PO PACK: 17.0000 g | PACK | Freq: Every day | ORAL | Status: DC | PRN

## 2017-05-23 MED ORDER — HYDROMORPHONE HCL 1 MG/ML IJ SOLN
INTRAMUSCULAR | Status: AC
  Filled 2017-05-23: qty 1

## 2017-05-23 MED ORDER — LOSARTAN POTASSIUM 50 MG PO TABS
100.0000 mg | ORAL_TABLET | Freq: Every day | ORAL | Status: DC
  Administered 2017-05-23: 100 mg via ORAL
  Filled 2017-05-23: qty 2

## 2017-05-23 MED ORDER — 0.9 % SODIUM CHLORIDE (POUR BTL) OPTIME
TOPICAL | Status: DC | PRN
Start: 2017-05-23 — End: 2017-05-23
  Administered 2017-05-23: 1000 mL

## 2017-05-23 MED ORDER — LOSARTAN POTASSIUM 50 MG PO TABS: 100.0000 mg | ORAL_TABLET | Freq: Every day | ORAL | Status: DC

## 2017-05-23 MED ORDER — MIDAZOLAM HCL 2 MG/2ML IJ SOLN
INTRAMUSCULAR | Status: AC
  Filled 2017-05-23: qty 2

## 2017-05-23 MED ORDER — HYDROMORPHONE HCL 1 MG/ML IJ SOLN
0.2500 mg | INTRAMUSCULAR | Status: DC | PRN
  Administered 2017-05-23 (×2): 0.5 mg via INTRAVENOUS

## 2017-05-23 MED ORDER — MIDAZOLAM HCL 5 MG/5ML IJ SOLN
INTRAMUSCULAR | Status: DC | PRN
  Administered 2017-05-23 (×2): 1 mg via INTRAVENOUS

## 2017-05-23 MED ORDER — MEPERIDINE HCL 25 MG/ML IJ SOLN: 6.2500 mg | INTRAMUSCULAR | Status: DC | PRN

## 2017-05-23 MED ORDER — BUPIVACAINE HCL (PF) 0.25 % IJ SOLN
INTRAMUSCULAR | Status: AC
  Filled 2017-05-23: qty 30

## 2017-05-23 MED ORDER — ONDANSETRON HCL 4 MG/2ML IJ SOLN: 4.0000 mg | Freq: Four times a day (QID) | INTRAMUSCULAR | Status: DC | PRN

## 2017-05-23 MED ORDER — LEVOTHYROXINE SODIUM 75 MCG PO TABS: 75.0000 ug | ORAL_TABLET | Freq: Every day | ORAL | Status: DC

## 2017-05-23 MED ORDER — LACTATED RINGERS IV SOLN: INTRAVENOUS | Status: DC

## 2017-05-23 MED ORDER — VANCOMYCIN HCL IN DEXTROSE 1-5 GM/200ML-% IV SOLN
1000.0000 mg | Freq: Once | INTRAVENOUS | Status: AC
  Administered 2017-05-23: 1000 mg via INTRAVENOUS
  Filled 2017-05-23: qty 200

## 2017-05-23 MED ORDER — LEVOTHYROXINE SODIUM 75 MCG PO TABS
75.0000 ug | ORAL_TABLET | Freq: Every day | ORAL | Status: DC
  Administered 2017-05-24: 75 ug via ORAL
  Filled 2017-05-23: qty 1

## 2017-05-23 MED ORDER — ROCURONIUM BROMIDE 100 MG/10ML IV SOLN
INTRAVENOUS | Status: DC | PRN
  Administered 2017-05-23: 50 mg via INTRAVENOUS

## 2017-05-23 MED ORDER — BUPIVACAINE HCL (PF) 0.25 % IJ SOLN
INTRAMUSCULAR | Status: DC | PRN
  Administered 2017-05-23: 10 mL

## 2017-05-23 MED ORDER — MENTHOL 3 MG MT LOZG: 1.0000 | LOZENGE | OROMUCOSAL | Status: DC | PRN

## 2017-05-23 MED ORDER — FENTANYL CITRATE (PF) 250 MCG/5ML IJ SOLN
INTRAMUSCULAR | Status: AC
  Filled 2017-05-23: qty 5

## 2017-05-23 SURGICAL SUPPLY — 43 items
APL SKNCLS STERI-STRIP NONHPOA (GAUZE/BANDAGES/DRESSINGS) ×1
BENZOIN TINCTURE PRP APPL 2/3 (GAUZE/BANDAGES/DRESSINGS) ×3 IMPLANT
BUR ROUND FLUTED 4 SOFT TCH (BURR) ×2 IMPLANT
BUR ROUND FLUTED 4MM SOFT TCH (BURR) ×1
CANISTER SUCT 3000ML PPV (MISCELLANEOUS) ×3 IMPLANT
CLOSURE STERI-STRIP 1/2X4 (GAUZE/BANDAGES/DRESSINGS) ×1
CLSR STERI-STRIP ANTIMIC 1/2X4 (GAUZE/BANDAGES/DRESSINGS) ×2 IMPLANT
COVER SURGICAL LIGHT HANDLE (MISCELLANEOUS) ×3 IMPLANT
DECANTER SPIKE VIAL GLASS SM (MISCELLANEOUS) ×3 IMPLANT
DRAPE HALF SHEET 40X57 (DRAPES) ×6 IMPLANT
DRAPE MICROSCOPE LEICA (MISCELLANEOUS) ×3 IMPLANT
DRAPE SURG 17X23 STRL (DRAPES) ×3 IMPLANT
DRSG MEPILEX BORDER 4X4 (GAUZE/BANDAGES/DRESSINGS) ×3 IMPLANT
DURAPREP 26ML APPLICATOR (WOUND CARE) ×3 IMPLANT
ELECT REM PT RETURN 9FT ADLT (ELECTROSURGICAL) ×3
ELECTRODE REM PT RTRN 9FT ADLT (ELECTROSURGICAL) ×1 IMPLANT
GLOVE BIOGEL PI IND STRL 8 (GLOVE) ×2 IMPLANT
GLOVE BIOGEL PI INDICATOR 8 (GLOVE) ×4
GLOVE ORTHO TXT STRL SZ7.5 (GLOVE) ×6 IMPLANT
GOWN STRL REUS W/ TWL LRG LVL3 (GOWN DISPOSABLE) ×2 IMPLANT
GOWN STRL REUS W/ TWL XL LVL3 (GOWN DISPOSABLE) ×1 IMPLANT
GOWN STRL REUS W/TWL 2XL LVL3 (GOWN DISPOSABLE) ×3 IMPLANT
GOWN STRL REUS W/TWL LRG LVL3 (GOWN DISPOSABLE) ×6
GOWN STRL REUS W/TWL XL LVL3 (GOWN DISPOSABLE) ×2
KIT BASIN OR (CUSTOM PROCEDURE TRAY) ×3 IMPLANT
KIT ROOM TURNOVER OR (KITS) ×3 IMPLANT
MANIFOLD NEPTUNE II (INSTRUMENTS) ×3 IMPLANT
NEEDLE HYPO 25GX1X1/2 BEV (NEEDLE) ×3 IMPLANT
NEEDLE SPNL 18GX3.5 QUINCKE PK (NEEDLE) ×3 IMPLANT
NS IRRIG 1000ML POUR BTL (IV SOLUTION) ×3 IMPLANT
PACK LAMINECTOMY ORTHO (CUSTOM PROCEDURE TRAY) ×3 IMPLANT
PAD ARMBOARD 7.5X6 YLW CONV (MISCELLANEOUS) ×6 IMPLANT
PATTIES SURGICAL .5 X.5 (GAUZE/BANDAGES/DRESSINGS) IMPLANT
PATTIES SURGICAL .75X.75 (GAUZE/BANDAGES/DRESSINGS) IMPLANT
SUT VIC AB 0 CT1 27 (SUTURE) ×3
SUT VIC AB 0 CT1 27XBRD ANBCTR (SUTURE) ×1 IMPLANT
SUT VIC AB 1 CTX 36 (SUTURE) ×3
SUT VIC AB 1 CTX36XBRD ANBCTR (SUTURE) ×1 IMPLANT
SUT VIC AB 2-0 CT1 27 (SUTURE) ×3
SUT VIC AB 2-0 CT1 TAPERPNT 27 (SUTURE) ×1 IMPLANT
SUT VIC AB 3-0 X1 27 (SUTURE) ×3 IMPLANT
TOWEL OR 17X24 6PK STRL BLUE (TOWEL DISPOSABLE) ×3 IMPLANT
TOWEL OR 17X26 10 PK STRL BLUE (TOWEL DISPOSABLE) ×3 IMPLANT

## 2017-05-23 NOTE — Anesthesia Procedure Notes (Signed)
Procedure Name: Intubation Performed by: Effie Berkshire, MD Pre-anesthesia Checklist: Patient identified, Emergency Drugs available, Suction available and Patient being monitored Patient Re-evaluated:Patient Re-evaluated prior to induction Oxygen Delivery Method: Circle system utilized Preoxygenation: Pre-oxygenation with 100% oxygen Induction Type: IV induction Ventilation: Mask ventilation without difficulty Grade View: Grade I Tube type: Oral Number of attempts: 1 Airway Equipment and Method: Stylet and Oral airway Placement Confirmation: ETT inserted through vocal cords under direct vision,  positive ETCO2 and breath sounds checked- equal and bilateral Tube secured with: Tape Dental Injury: Teeth and Oropharynx as per pre-operative assessment

## 2017-05-23 NOTE — Interval H&P Note (Signed)
History and Physical Interval Note:  05/23/2017 10:32 AM  Ronnie Snyder  has presented today for surgery, with the diagnosis of L4-5 Stenosis  The various methods of treatment have been discussed with the patient and family. After consideration of risks, benefits and other options for treatment, the patient has consented to  Procedure(s): L4-5 DECOMPRESSION (N/A) as a surgical intervention .  The patient's history has been reviewed, patient examined, no change in status, stable for surgery.  I have reviewed the patient's chart and labs.  Questions were answered to the patient's satisfaction.     Marybelle Killings

## 2017-05-23 NOTE — Evaluation (Signed)
Physical Therapy Evaluation Patient Details Name: Ronnie Snyder MRN: [id number] DOB: 08-07-1940 Today's Date: 05/23/2017   History of Present Illness  Pt is a 77 y/o male s/p L4-5 laminectomy. PMH includes HTN, PAD, anxiety, skin CA, and R hip fracture surgery.   Clinical Impression  Patient is s/p above surgery resulting in the deficits listed below (see PT Problem List). PTA, pt was using cane for ambulation secondary to pain in LLE. Upon eval, pt presenting with post op pain and unsteadiness. Had LOB X 2 during gait and required min A for steadying. Otherwise required min guard for safety. Educated to use cane at home to increase safety. Reports family will be able to assist at d/c and has all necessary DME. Patient will benefit from skilled PT to increase their independence and safety with mobility (while adhering to their precautions) to allow discharge to the venue listed below. Will continue to follow acutely.      Follow Up Recommendations No PT follow up;Supervision for mobility/OOB    Equipment Recommendations  None recommended by PT    Recommendations for Other Services       Precautions / Restrictions Precautions Precautions: Back Precaution Booklet Issued: Yes (comment) Precaution Comments: Reviewed back precautions with pt.  Restrictions Weight Bearing Restrictions: No      Mobility  Bed Mobility Overal bed mobility: Needs Assistance Bed Mobility: Rolling;Sidelying to Sit;Sit to Sidelying Rolling: Supervision Sidelying to sit: Supervision     Sit to sidelying: Supervision General bed mobility comments: Supervision to ensure log roll technique. Verbal cues for technique.   Transfers Overall transfer level: Needs assistance Equipment used: None Transfers: Sit to/from Stand Sit to Stand: Min guard;Min assist         General transfer comment: Pt with LOB upon standing requiring min A for steadying. Otherwise min guard for safety.    Ambulation/Gait Ambulation/Gait assistance: Min assist;Min guard Ambulation Distance (Feet): 300 Feet Assistive device: None Gait Pattern/deviations: Step-through pattern;Decreased stride length;Drifts right/left Gait velocity: Decreased  Gait velocity interpretation: Below normal speed for age/gender General Gait Details: Slow, unsteady gait. LOB X 2 requiring min A for steadying. Otherwise required min guard for safety. Educated about use of cane for ambulation to increase safety. Educated about generalized walking program to perform at home.   Stairs            Wheelchair Mobility    Modified Rankin (Stroke Patients Only)       Balance Overall balance assessment: Needs assistance Sitting-balance support: No upper extremity supported;Feet supported Sitting balance-Leahy Scale: Good     Standing balance support: No upper extremity supported;During functional activity Standing balance-Leahy Scale: Fair                               Pertinent Vitals/Pain Pain Assessment: 0-10 Pain Score: 5  Pain Location: back  Pain Descriptors / Indicators: Aching;Operative site guarding Pain Intervention(s): Limited activity within patient's tolerance;Monitored during session;Repositioned    Home Living Family/patient expects to be discharged to:: Private residence Living Arrangements: Spouse/significant other Available Help at Discharge: Family;Available 24 hours/day Type of Home: House Home Access: Stairs to enter Entrance Stairs-Rails: None Entrance Stairs-Number of Steps: 2 Home Layout: One level Home Equipment: Cane - single point;Bedside commode;Shower seat Additional Comments: Pt reports he is the primary caregiver for his wife, however, other family will be able to assist with her caretaking and taking care of him at d/c.  Prior Function Level of Independence: Independent with assistive device(s)         Comments: Used cane for ambulation.       Hand Dominance        Extremity/Trunk Assessment   Upper Extremity Assessment Upper Extremity Assessment: Defer to OT evaluation    Lower Extremity Assessment Lower Extremity Assessment: Generalized weakness    Cervical / Trunk Assessment Cervical / Trunk Assessment: Other exceptions Cervical / Trunk Exceptions: s/p spinal surgery   Communication   Communication: No difficulties  Cognition Arousal/Alertness: Awake/alert Behavior During Therapy: WFL for tasks assessed/performed Overall Cognitive Status: Within Functional Limits for tasks assessed                                        General Comments      Exercises     Assessment/Plan    PT Assessment Patient needs continued PT services  PT Problem List Decreased strength;Decreased balance;Decreased mobility;Decreased knowledge of use of DME;Decreased knowledge of precautions;Pain       PT Treatment Interventions Gait training;DME instruction;Stair training;Functional mobility training;Therapeutic activities;Therapeutic exercise;Balance training;Neuromuscular re-education;Patient/family education    PT Goals (Current goals can be found in the Care Plan section)  Acute Rehab PT Goals Patient Stated Goal: to go home  PT Goal Formulation: With patient Time For Goal Achievement: 05/30/17 Potential to Achieve Goals: Good    Frequency Min 5X/week   Barriers to discharge        Co-evaluation               AM-PAC PT "6 Clicks" Daily Activity  Outcome Measure Difficulty turning over in bed (including adjusting bedclothes, sheets and blankets)?: A Little Difficulty moving from lying on back to sitting on the side of the bed? : A Little Difficulty sitting down on and standing up from a chair with arms (e.g., wheelchair, bedside commode, etc,.)?: Unable Help needed moving to and from a bed to chair (including a wheelchair)?: A Little Help needed walking in hospital room?: A Little Help  needed climbing 3-5 steps with a railing? : A Lot 6 Click Score: 15    End of Session Equipment Utilized During Treatment: Gait belt Activity Tolerance: Patient tolerated treatment well Patient left: in bed;with call bell/phone within reach Nurse Communication: Mobility status PT Visit Diagnosis: Unsteadiness on feet (R26.81);Other abnormalities of gait and mobility (R26.89);Pain Pain - part of body: (back )    Time: 1478-2956 PT Time Calculation (min) (ACUTE ONLY): 17 min   Charges:   PT Evaluation $PT Eval Low Complexity: 1 Low     PT G Codes:        Leighton Ruff, PT, DPT  Acute Rehabilitation Services  Pager: [phone]   Rudean Hitt 05/23/2017, 3:55 PM

## 2017-05-23 NOTE — Discharge Instructions (Addendum)
ORTHOPEDIC DISCHARGE INSTRUCTIONS  -OK TO SHOWER 2 DAYS POSTOP.  NO TUB SOAKING.  Do not apply any creams or ointments to incision. Daily dressing changes with gauze and tape.  -No driving until further notice.  -No lifting, pushing, pulling, twisting.  -Okay to do some walking but nothing too excessive.  -If you have any increase in her pain, fever/chills contact our office immediately.

## 2017-05-23 NOTE — Transfer of Care (Signed)
Immediate Anesthesia Transfer of Care Note  Patient: Ronnie Snyder  Procedure(s) Performed: L4-5 DECOMPRESSION (N/A )  Patient Location: PACU  Anesthesia Type:General  Level of Consciousness: awake, oriented, drowsy and patient cooperative  Airway & Oxygen Therapy: Patient Spontanous Breathing and Patient connected to face mask oxygen  Post-op Assessment: Report given to RN, Post -op Vital signs reviewed and stable and Patient moving all extremities  Post vital signs: Reviewed and stable  Last Vitals:  Vitals:   05/23/17 0802  BP: (!) 161/66  Pulse: 71  Resp: 18  Temp: 36.5 C  SpO2: 100%    Last Pain:  Vitals:   05/23/17 0834  TempSrc:   PainSc: 4       Patients Stated Pain Goal: 2 (37/48/27 0786)  Complications: No apparent anesthesia complications

## 2017-05-23 NOTE — Op Note (Signed)
Preop diagnosis: L4-5 stenosis, central and  bilateral recess stenosis  Postop diagnosis: Same  Procedure: L4-5 decompression  Surgeon: Rodell Perna MD  Assistant Benjiman Core PA-C medically necessary and present for the entire procedure  Anesthesia general  EBL: See anesthetic record  Findings severe central and lateral recess stenosis consistent with preoperative imaging.  L4-5 level.  Procedure: After standard prepping draping after patient was intubated placed prone on chest rolls back was prepped with DuraPrep Pumpers were used.  Patient was then and after squaring the area with towels Betadine Steri-Drape laminectomy she to spinal needles were placed based on palpation which would be just barely above and below the level of the disc at L4-5.  Crosstable lateral x-ray confirmed this.  Timeout procedure completed midline incision was made subperiosteal dissection self-retaining retractor to cooker clamps placed just barely above and below the disc space for planned resection area since patient had short level stenosis centered at the level of the disc due primarily to hypertrophic ligamentum and somewhat due to overhanging facets spurs and some disc bulge.  Coker clamps are in good position bone was marked and then posterior resection of the spinous process and lamina leaving the top half of the L4 lamina and spinous process.  Bone is noted be soft there was very large thick chunks of hypertrophic ligamentum which was removed.  Decompression up to the epidural fat at the mid body level of L4.  Disc was bulging but not herniated.  Lateral recess was decompressed right and left adjusting the operative microscope for visualization.  Top portion of L5 lamina was taken and lateral gutters were checked all overhanging spurs were removed bone was removed at the left pedicle irrigation with saline solution and then standard layered closure.  Epidural veins were intact and did not need to be coagulated.   Patient tolerated the procedure well postoperative skin closure after #1 Vicryl in the fascia to on the subcutaneous tissue and postoperative dressing was transferred to recovery room.

## 2017-05-23 NOTE — Anesthesia Preprocedure Evaluation (Addendum)
Anesthesia Evaluation  Patient identified by MRN, date of birth, ID band Patient awake    Reviewed: Allergy & Precautions, NPO status , Patient's Chart, lab work & pertinent test results  Airway Mallampati: I  TM Distance: >3 FB Neck ROM: Full    Dental  (+) Edentulous Upper, Dental Advisory Given   Pulmonary Current Smoker,     + decreased breath sounds      Cardiovascular hypertension, Pt. on medications + Peripheral Vascular Disease   Rhythm:Regular Rate:Normal     Neuro/Psych PSYCHIATRIC DISORDERS Anxiety Depression negative neurological ROS     GI/Hepatic Neg liver ROS, hiatal hernia, GERD  ,  Endo/Other  diabetesHypothyroidism   Renal/GU negative Renal ROS  negative genitourinary   Musculoskeletal  (+) Arthritis ,   Abdominal Normal abdominal exam  (+)   Peds  Hematology   Anesthesia Other Findings   Reproductive/Obstetrics                            Lab Results  Component Value Date   WBC 6.6 05/23/2017   HGB 9.6 (L) 05/23/2017   HCT 31.7 (L) 05/23/2017   MCV 83.9 05/23/2017   PLT 271 05/23/2017   Lab Results  Component Value Date   CREATININE 0.59 (L) 05/23/2017   BUN 11 05/23/2017   NA 132 (L) 05/23/2017   K 4.0 05/23/2017   CL 98 (L) 05/23/2017   CO2 24 05/23/2017   Lab Results  Component Value Date   INR 1.02 04/22/2017   INR 0.97 01/15/2012   INR 1.0 11/27/2011   EKG: normal sinus rhythm.  Anesthesia Physical Anesthesia Plan  ASA: II  Anesthesia Plan: General   Post-op Pain Management:    Induction: Intravenous  PONV Risk Score and Plan: 2 and Ondansetron and Dexamethasone  Airway Management Planned: Oral ETT  Additional Equipment: None  Intra-op Plan:   Post-operative Plan: Extubation in OR  Informed Consent: I have reviewed the patients History and Physical, chart, labs and discussed the procedure including the risks, benefits and  alternatives for the proposed anesthesia with the patient or authorized representative who has indicated his/her understanding and acceptance.   Dental advisory given  Plan Discussed with: CRNA  Anesthesia Plan Comments:         Anesthesia Quick Evaluation

## 2017-05-23 NOTE — Anesthesia Procedure Notes (Signed)
Procedure Name: Intubation Date/Time: 05/23/2017 11:24 AM Performed by: Effie Berkshire, MD Pre-anesthesia Checklist: Patient identified, Emergency Drugs available, Suction available, Patient being monitored and Timeout performed Patient Re-evaluated:Patient Re-evaluated prior to induction Oxygen Delivery Method: Circle system utilized Preoxygenation: Pre-oxygenation with 100% oxygen Induction Type: IV induction Ventilation: Mask ventilation without difficulty Laryngoscope Size: Mac and 4 Tube type: Oral Tube size: 7.5 mm Number of attempts: 1 Airway Equipment and Method: Stylet and Oral airway Placement Confirmation: ETT inserted through vocal cords under direct vision,  positive ETCO2 and breath sounds checked- equal and bilateral Secured at: 21 cm Tube secured with: Tape Dental Injury: Teeth and Oropharynx as per pre-operative assessment

## 2017-05-24 DIAGNOSIS — M48061 Spinal stenosis, lumbar region without neurogenic claudication: Secondary | ICD-10-CM | POA: Diagnosis not present

## 2017-05-24 DIAGNOSIS — M19071 Primary osteoarthritis, right ankle and foot: Secondary | ICD-10-CM | POA: Diagnosis not present

## 2017-05-24 DIAGNOSIS — E1151 Type 2 diabetes mellitus with diabetic peripheral angiopathy without gangrene: Secondary | ICD-10-CM | POA: Diagnosis not present

## 2017-05-24 DIAGNOSIS — F419 Anxiety disorder, unspecified: Secondary | ICD-10-CM | POA: Diagnosis not present

## 2017-05-24 DIAGNOSIS — I714 Abdominal aortic aneurysm, without rupture: Secondary | ICD-10-CM | POA: Diagnosis not present

## 2017-05-24 DIAGNOSIS — M19072 Primary osteoarthritis, left ankle and foot: Secondary | ICD-10-CM | POA: Diagnosis not present

## 2017-05-24 MED ORDER — ALPRAZOLAM 0.25 MG PO TABS
0.2500 mg | ORAL_TABLET | Freq: Two times a day (BID) | ORAL | Status: DC | PRN
  Administered 2017-05-24: 0.25 mg via ORAL
  Filled 2017-05-24: qty 1

## 2017-05-24 NOTE — Progress Notes (Signed)
   Subjective: 1 Day Post-Op Procedure(s) (LRB): L4-5 DECOMPRESSION (N/A) Patient reports pain as mild.    Objective: Vital signs in last 24 hours: Temp:  [97.5 F (36.4 C)-98.2 F (36.8 C)] 97.7 F (36.5 C) (01/05 0734) Pulse Rate:  [63-109] 71 (01/05 0734) Resp:  [11-27] 16 (01/05 0734) BP: (112-158)/(60-86) 140/86 (01/05 0734) SpO2:  [67 %-99 %] 97 % (01/05 0734)  Intake/Output from previous day: 01/04 0701 - 01/05 0700 In: 1440 [P.O.:240; I.V.:1200] Out: 125 [Urine:100; Blood:25] Intake/Output this shift: No intake/output data recorded.  Recent Labs    05/23/17 0747  HGB 9.6*   Recent Labs    05/23/17 0747  WBC 6.6  RBC 3.78*  HCT 31.7*  PLT 271   Recent Labs    05/23/17 0747  NA 132*  K 4.0  CL 98*  CO2 24  BUN 11  CREATININE 0.59*  GLUCOSE 99  CALCIUM 9.1   No results for input(s): LABPT, INR in the last 72 hours.  Neurologically intact Dg Lumbar Spine 2-3 Views  Result Date: 05/23/2017 CLINICAL DATA:  L4-5 decompression. EXAM: LUMBAR SPINE - 2-3 VIEW COMPARISON:  CT scan of November 05, 2016. FINDINGS: Two intraoperative cross-table lateral projections of the lumbar spine were obtained. The first image demonstrates surgical probes positioned over the posterior spinous processes of L4 and L5. The second image also demonstrates surgical probes directed toward the posterior elements of L4 and L5. IMPRESSION: Surgical localization as described above. Electronically Signed   By: Marijo Conception, M.D.   On: 05/23/2017 12:07    Assessment/Plan: 1 Day Post-Op Procedure(s) (LRB): L4-5 DECOMPRESSION (N/A) Plan:  Walking in hall. Discharge after breakfast. Activities with daily walking discussed.   Marybelle Killings 05/24/2017, 8:31 AM

## 2017-05-24 NOTE — Progress Notes (Signed)
Physical Therapy Treatment Patient Details Name: Ronnie Snyder MRN: [id number] DOB: [birth year] Today's Date: 05/24/2017    History of Present Illness Pt is a 77 y/o male s/p L4-5 laminectomy. PMH includes HTN, PAD, anxiety, skin CA, and R hip fracture surgery.     PT Comments    Patient is making good progress with PT.  From a mobility standpoint anticipate patient will be ready for DC home when medically ready.    Follow Up Recommendations  No PT follow up;Supervision for mobility/OOB     Equipment Recommendations  None recommended by PT    Recommendations for Other Services       Precautions / Restrictions Precautions Precautions: Back Precaution Booklet Issued: Yes (comment) Precaution Comments: back precautions reviewed with pt Restrictions Weight Bearing Restrictions: No    Mobility  Bed Mobility Overal bed mobility: Modified Independent Bed Mobility: Rolling;Sidelying to Sit           General bed mobility comments: cues for sequencing of log roll technique  Transfers Overall transfer level: Needs assistance Equipment used: None Transfers: Sit to/from Stand Sit to Stand: Supervision         General transfer comment: supervision for safety; cues for technique to decrease trunk flexion  Ambulation/Gait Ambulation/Gait assistance: Supervision Ambulation Distance (Feet): 300 Feet Assistive device: None Gait Pattern/deviations: Step-through pattern;Decreased stride length;Drifts right/left Gait velocity: Decreased    General Gait Details: cues for stride length and posture; supervision for safety; pt tolerated horizontal head turns and directional changes without LOB   Stairs Stairs: Yes   Stair Management: One rail Right;Step to pattern;Forwards Number of Stairs: 4 General stair comments: cues for sequencing; supervision for safety  Wheelchair Mobility    Modified Rankin (Stroke Patients Only)       Balance Overall balance  assessment: Needs assistance Sitting-balance support: No upper extremity supported;Feet supported Sitting balance-Leahy Scale: Good     Standing balance support: No upper extremity supported;During functional activity Standing balance-Leahy Scale: Fair                              Cognition Arousal/Alertness: Awake/alert Behavior During Therapy: WFL for tasks assessed/performed Overall Cognitive Status: Within Functional Limits for tasks assessed                                        Exercises      General Comments General comments (skin integrity, edema, etc.): activity progression discussed with pt      Pertinent Vitals/Pain Pain Assessment: Faces Faces Pain Scale: Hurts a little bit Pain Location: back  Pain Descriptors / Indicators: Discomfort;Guarding Pain Intervention(s): Monitored during session;Repositioned    Home Living                      Prior Function            PT Goals (current goals can now be found in the care plan section) Acute Rehab PT Goals Patient Stated Goal: to go home  PT Goal Formulation: With patient Time For Goal Achievement: 05/30/17 Potential to Achieve Goals: Good Progress towards PT goals: Progressing toward goals    Frequency    Min 5X/week      PT Plan Current plan remains appropriate    Co-evaluation              AM-PAC  PT "6 Clicks" Daily Activity  Outcome Measure  Difficulty turning over in bed (including adjusting bedclothes, sheets and blankets)?: None Difficulty moving from lying on back to sitting on the side of the bed? : A Little Difficulty sitting down on and standing up from a chair with arms (e.g., wheelchair, bedside commode, etc,.)?: A Lot Help needed moving to and from a bed to chair (including a wheelchair)?: None Help needed walking in hospital room?: A Little Help needed climbing 3-5 steps with a railing? : A Little 6 Click Score: 19    End of Session  Equipment Utilized During Treatment: Gait belt Activity Tolerance: Patient tolerated treatment well Patient left: in bed;with call bell/phone within reach;Other (comment)(pt sitting EOB) Nurse Communication: Mobility status PT Visit Diagnosis: Unsteadiness on feet (R26.81);Other abnormalities of gait and mobility (R26.89);Pain Pain - part of body: (back )     Time: 0938-1829 PT Time Calculation (min) (ACUTE ONLY): 10 min  Charges:  $Gait Training: 8-22 mins                    G Codes:       Earney Navy, PTA Pager: [phone]     Darliss Cheney 05/24/2017, 9:21 AM

## 2017-05-24 NOTE — Progress Notes (Signed)
Patient ID: Ronnie Snyder, male   DOB: 06-21-1940, 77 y.o.   MRN: [id number] Is postoperative day 1 lumbar spine surgery.  Patient has no complaints he states his radicular pain has resolved he requests no pain medication plan for discharge to home today.

## 2017-05-24 NOTE — Anesthesia Postprocedure Evaluation (Signed)
Anesthesia Post Note  Patient: Caprice Renshaw Bina  Procedure(s) Performed: L4-5 DECOMPRESSION (N/A )     Patient location during evaluation: PACU Anesthesia Type: General Level of consciousness: awake and alert Pain management: pain level controlled Vital Signs Assessment: post-procedure vital signs reviewed and stable Respiratory status: spontaneous breathing, nonlabored ventilation, respiratory function stable and patient connected to nasal cannula oxygen Cardiovascular status: blood pressure returned to baseline and stable Postop Assessment: no apparent nausea or vomiting Anesthetic complications: no    Last Vitals:  Vitals:   05/24/17 0321 05/24/17 0734  BP: 140/70 140/86  Pulse: 69 71  Resp: 16 16  Temp: 36.8 C 36.5 C  SpO2: 98% 97%    Last Pain:  Vitals:   05/24/17 0321  TempSrc: Oral  PainSc:                  Effie Berkshire

## 2017-05-24 NOTE — Progress Notes (Signed)
Patient alert and oriented, mae's well, voiding adequate amount of urine, swallowing without difficulty, no c/o pain at time of discharge. Patient discharged home with family. Script and discharged instructions given to patient. Patient and family stated understanding of instructions given. Patient has an appointment with Dr. Lorin Mercy

## 2017-05-25 ENCOUNTER — Encounter (HOSPITAL_COMMUNITY): Payer: Self-pay | Admitting: Orthopaedic Surgery

## 2017-05-28 ENCOUNTER — Ambulatory Visit (INDEPENDENT_AMBULATORY_CARE_PROVIDER_SITE_OTHER): Payer: Medicare Other | Admitting: Surgery

## 2017-05-28 ENCOUNTER — Encounter (INDEPENDENT_AMBULATORY_CARE_PROVIDER_SITE_OTHER): Payer: Self-pay | Admitting: Surgery

## 2017-05-28 DIAGNOSIS — Z9889 Other specified postprocedural states: Secondary | ICD-10-CM
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
## 2017-06-12 ENCOUNTER — Ambulatory Visit: Payer: Medicare Other | Admitting: Endocrinology

## 2017-06-18 ENCOUNTER — Ambulatory Visit (INDEPENDENT_AMBULATORY_CARE_PROVIDER_SITE_OTHER): Payer: Medicare Other | Admitting: Orthopaedic Surgery

## 2017-06-18 ENCOUNTER — Encounter (INDEPENDENT_AMBULATORY_CARE_PROVIDER_SITE_OTHER): Payer: Self-pay | Admitting: Orthopaedic Surgery

## 2017-06-18 VITALS — BP 191/85 | HR 77 | Ht 68.0 in | Wt 140.0 lb

## 2017-06-18 DIAGNOSIS — Z9889 Other specified postprocedural states: Secondary | ICD-10-CM
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
## 2017-06-25 ENCOUNTER — Encounter: Payer: Self-pay | Admitting: Endocrinology

## 2017-06-25 ENCOUNTER — Ambulatory Visit (INDEPENDENT_AMBULATORY_CARE_PROVIDER_SITE_OTHER): Payer: Medicare Other | Admitting: Endocrinology

## 2017-06-25 VITALS — BP 146/84 | HR 70 | Ht 68.0 in | Wt 137.2 lb

## 2017-06-25 DIAGNOSIS — D509 Iron deficiency anemia, unspecified: Secondary | ICD-10-CM | POA: Diagnosis not present

## 2017-06-25 DIAGNOSIS — I10 Essential (primary) hypertension: Secondary | ICD-10-CM

## 2017-06-25 DIAGNOSIS — E1151 Type 2 diabetes mellitus with diabetic peripheral angiopathy without gangrene: Secondary | ICD-10-CM

## 2017-06-25 LAB — CBC WITH DIFFERENTIAL/PLATELET
BASOS ABS: 0.1 10*3/uL (ref 0.0–0.1)
BASOS PCT: 1.2 % (ref 0.0–3.0)
EOS ABS: 0.1 10*3/uL (ref 0.0–0.7)
Eosinophils Relative: 2.6 % (ref 0.0–5.0)
HCT: 31.1 % — ABNORMAL LOW (ref 39.0–52.0)
HEMOGLOBIN: 10 g/dL — AB (ref 13.0–17.0)
Lymphocytes Relative: 29.9 % (ref 12.0–46.0)
Lymphs Abs: 1.6 10*3/uL (ref 0.7–4.0)
MCHC: 32.2 g/dL (ref 30.0–36.0)
MCV: 80.4 fl (ref 78.0–100.0)
MONO ABS: 0.5 10*3/uL (ref 0.1–1.0)
Monocytes Relative: 9.5 % (ref 3.0–12.0)
Neutro Abs: 3.1 10*3/uL (ref 1.4–7.7)
Neutrophils Relative %: 56.8 % (ref 43.0–77.0)
Platelets: 222 10*3/uL (ref 150.0–400.0)
RBC: 3.87 Mil/uL — ABNORMAL LOW (ref 4.22–5.81)
RDW: 14.7 % (ref 11.5–15.5)
WBC: 5.5 10*3/uL (ref 4.0–10.5)

## 2017-06-25 LAB — BASIC METABOLIC PANEL
BUN: 8 mg/dL (ref 6–23)
CO2: 30 mEq/L (ref 19–32)
Calcium: 9.3 mg/dL (ref 8.4–10.5)
Chloride: 98 mEq/L (ref 96–112)
Creatinine, Ser: 0.54 mg/dL (ref 0.40–1.50)
GFR: 157.37 mL/min (ref >60.00)
GLUCOSE: 101 mg/dL — AB (ref 70–99)
POTASSIUM: 4.3 meq/L (ref 3.5–5.1)
SODIUM: 134 meq/L — AB (ref 135–145)

## 2017-06-25 LAB — POCT GLYCOSYLATED HEMOGLOBIN (HGB A1C): HEMOGLOBIN A1C: 5.5

## 2017-06-25 LAB — IBC PANEL
IRON: 25 ug/dL — AB (ref 42–165)
SATURATION RATIOS: 4.9 % — AB (ref 20.0–50.0)
TRANSFERRIN: 364 mg/dL — AB (ref 212.0–360.0)

## 2017-06-25 MED ORDER — CLOTRIMAZOLE-BETAMETHASONE 1-0.05 % EX CREA: 1.0000 "application " | TOPICAL_CREAM | Freq: Three times a day (TID) | CUTANEOUS | 2 refills | Status: DC | PRN

## 2017-06-25 NOTE — Patient Instructions (Addendum)
blood tests are requested for you today.  We'll let you know about the results.   Please continue the same medications Please come back for a follow-up appointment in 4-5 months (must be after 10/02/17).
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
## 2017-07-07 ENCOUNTER — Encounter: Payer: Self-pay | Admitting: Endocrinology

## 2017-07-07 ENCOUNTER — Ambulatory Visit
Admission: RE | Admit: 2017-07-07 | Discharge: 2017-07-07 | Disposition: A | Discharge status: Home / Self Care | Payer: Medicare Other | Source: Ambulatory Visit | Attending: Endocrinology | Admitting: Endocrinology

## 2017-07-07 ENCOUNTER — Ambulatory Visit (INDEPENDENT_AMBULATORY_CARE_PROVIDER_SITE_OTHER): Payer: Medicare Other | Admitting: Endocrinology

## 2017-07-07 VITALS — BP 138/72 | HR 80 | Temp 98.2°F | Wt 131.8 lb

## 2017-07-07 DIAGNOSIS — R05 Cough: Secondary | ICD-10-CM | POA: Diagnosis not present

## 2017-07-07 DIAGNOSIS — R059 Cough, unspecified: Secondary | ICD-10-CM

## 2017-07-07 MED ORDER — AZITHROMYCIN 500 MG PO TABS: 500.0000 mg | ORAL_TABLET | Freq: Every day | ORAL | 0 refills | Status: DC
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
## 2017-07-07 NOTE — Patient Instructions (Addendum)
I have sent a prescription to your pharmacy, for an antibiotic pill. A chest x-ray is requested for you today.  We'll let you know about the results.  I hope you feel better soon.  If you don't feel better by next week, please call back.  Please call sooner if you get worse.

## 2017-07-10 ENCOUNTER — Other Ambulatory Visit: Payer: Self-pay | Admitting: Endocrinology

## 2017-07-10 ENCOUNTER — Encounter: Payer: Self-pay | Admitting: Endocrinology

## 2017-07-10 MED ORDER — PROMETHAZINE-CODEINE 6.25-10 MG/5ML PO SYRP: 5.0000 mL | ORAL_SOLUTION | ORAL | 0 refills | Status: DC | PRN

## 2017-07-10 NOTE — Telephone Encounter (Signed)
Ok to refill 

## 2017-07-11 MED ORDER — ALPRAZOLAM 0.25 MG PO TABS: 0.2500 mg | ORAL_TABLET | Freq: Two times a day (BID) | ORAL | 0 refills | Status: DC | PRN

## 2017-07-14 ENCOUNTER — Encounter: Payer: Self-pay | Admitting: Endocrinology

## 2017-07-21 ENCOUNTER — Ambulatory Visit
Admission: RE | Admit: 2017-07-21 | Discharge: 2017-07-21 | Disposition: A | Discharge status: Home / Self Care | Payer: Medicare Other | Source: Ambulatory Visit | Attending: Endocrinology | Admitting: Endocrinology

## 2017-07-21 DIAGNOSIS — R05 Cough: Secondary | ICD-10-CM

## 2017-07-21 DIAGNOSIS — R059 Cough, unspecified: Secondary | ICD-10-CM

## 2017-07-21 DIAGNOSIS — R918 Other nonspecific abnormal finding of lung field: Secondary | ICD-10-CM | POA: Diagnosis not present

## 2017-07-22 ENCOUNTER — Encounter: Payer: Self-pay | Admitting: Endocrinology

## 2017-09-15 ENCOUNTER — Other Ambulatory Visit: Payer: Self-pay | Admitting: Endocrinology

## 2017-09-15 MED ORDER — ALPRAZOLAM 0.25 MG PO TABS
0.2500 mg | ORAL_TABLET | Freq: Two times a day (BID) | ORAL | 0 refills | Status: DC | PRN
Start: 2017-09-15 — End: 2018-01-22

## 2017-09-15 NOTE — Telephone Encounter (Signed)
Ok to refill 

## 2017-09-27 ENCOUNTER — Other Ambulatory Visit: Payer: Self-pay | Admitting: Endocrinology

## 2017-10-02 ENCOUNTER — Encounter: Payer: Self-pay | Admitting: Endocrinology

## 2017-10-02 ENCOUNTER — Ambulatory Visit (INDEPENDENT_AMBULATORY_CARE_PROVIDER_SITE_OTHER): Payer: Medicare Other | Admitting: Endocrinology

## 2017-10-02 VITALS — BP 116/58 | HR 69 | Ht 68.0 in | Wt 135.0 lb

## 2017-10-02 DIAGNOSIS — E1151 Type 2 diabetes mellitus with diabetic peripheral angiopathy without gangrene: Secondary | ICD-10-CM

## 2017-10-02 DIAGNOSIS — Z0000 Encounter for general adult medical examination without abnormal findings: Secondary | ICD-10-CM

## 2017-10-02 DIAGNOSIS — D509 Iron deficiency anemia, unspecified: Secondary | ICD-10-CM

## 2017-10-02 DIAGNOSIS — Z125 Encounter for screening for malignant neoplasm of prostate: Secondary | ICD-10-CM

## 2017-10-02 LAB — LIPID PANEL
CHOLESTEROL: 157 mg/dL (ref 0–200)
HDL: 51 mg/dL (ref >39.00)
LDL CALC: 86 mg/dL (ref 0–99)
NonHDL: 106.22
Total CHOL/HDL Ratio: 3
Triglycerides: 100 mg/dL (ref 0.0–149.0)
VLDL: 20 mg/dL (ref 0.0–40.0)

## 2017-10-02 LAB — BASIC METABOLIC PANEL
BUN: 14 mg/dL (ref 6–23)
CHLORIDE: 100 meq/L (ref 96–112)
CO2: 27 meq/L (ref 19–32)
Calcium: 9.1 mg/dL (ref 8.4–10.5)
Creatinine, Ser: 0.68 mg/dL (ref 0.40–1.50)
GFR: 120.52 mL/min (ref >60.00)
GLUCOSE: 110 mg/dL — AB (ref 70–99)
POTASSIUM: 4.4 meq/L (ref 3.5–5.1)
SODIUM: 135 meq/L (ref 135–145)

## 2017-10-02 LAB — CBC WITH DIFFERENTIAL/PLATELET
BASOS PCT: 1.8 % (ref 0.0–3.0)
Basophils Absolute: 0.1 10*3/uL (ref 0.0–0.1)
EOS ABS: 0 10*3/uL (ref 0.0–0.7)
Eosinophils Relative: 1.3 % (ref 0.0–5.0)
HEMATOCRIT: 25.1 % — AB (ref 39.0–52.0)
LYMPHS PCT: 26.5 % (ref 12.0–46.0)
Lymphs Abs: 1 10*3/uL (ref 0.7–4.0)
MCHC: 31.3 g/dL (ref 30.0–36.0)
MCV: 69.9 fl — ABNORMAL LOW (ref 78.0–100.0)
Monocytes Absolute: 0.9 10*3/uL (ref 0.1–1.0)
Monocytes Relative: 23.5 % — ABNORMAL HIGH (ref 3.0–12.0)
NEUTROS ABS: 1.8 10*3/uL (ref 1.4–7.7)
Neutrophils Relative %: 46.9 % (ref 43.0–77.0)
Platelets: 195 10*3/uL (ref 150.0–400.0)
RBC: 3.59 Mil/uL — ABNORMAL LOW (ref 4.22–5.81)
RDW: 15.8 % — AB (ref 11.5–15.5)
WBC: 3.8 10*3/uL — AB (ref 4.0–10.5)

## 2017-10-02 LAB — URINALYSIS, ROUTINE W REFLEX MICROSCOPIC
Bilirubin Urine: NEGATIVE
HGB URINE DIPSTICK: NEGATIVE
KETONES UR: NEGATIVE
Leukocytes, UA: NEGATIVE
NITRITE: NEGATIVE
RBC / HPF: NONE SEEN (ref 0–?)
SPECIFIC GRAVITY, URINE: 1.015 (ref 1.000–1.030)
Total Protein, Urine: 100 — AB
URINE GLUCOSE: NEGATIVE
Urobilinogen, UA: 0.2 (ref 0.0–1.0)
WBC UA: NONE SEEN (ref 0–?)
pH: 6.5 (ref 5.0–8.0)

## 2017-10-02 LAB — HEPATIC FUNCTION PANEL
ALT: 10 U/L (ref 0–53)
AST: 14 U/L (ref 0–37)
Albumin: 4.1 g/dL (ref 3.5–5.2)
Alkaline Phosphatase: 84 U/L (ref 39–117)
BILIRUBIN DIRECT: 0.1 mg/dL (ref 0.0–0.3)
BILIRUBIN TOTAL: 0.3 mg/dL (ref 0.2–1.2)
Total Protein: 6.6 g/dL (ref 6.0–8.3)

## 2017-10-02 LAB — POCT GLYCOSYLATED HEMOGLOBIN (HGB A1C): Hemoglobin A1C: 5.3

## 2017-10-02 LAB — IBC PANEL
IRON: 10 ug/dL — AB (ref 42–165)
SATURATION RATIOS: 2.1 % — AB (ref 20.0–50.0)
TRANSFERRIN: 344 mg/dL (ref 212.0–360.0)

## 2017-10-02 LAB — TSH: TSH: 4.14 u[IU]/mL (ref 0.35–4.50)

## 2017-10-02 LAB — PSA, MEDICARE: PSA: 0.39 ng/ml (ref 0.10–4.00)

## 2017-10-02 LAB — MICROALBUMIN / CREATININE URINE RATIO
Creatinine,U: 192.2 mg/dL
Microalb Creat Ratio: 19.4 mg/g (ref 0.0–30.0)
Microalb, Ur: 37.4 mg/dL — ABNORMAL HIGH (ref 0.0–1.9)

## 2017-10-02 MED ORDER — VENLAFAXINE HCL ER 37.5 MG PO CP24: 37.5000 mg | ORAL_CAPSULE | Freq: Every day | ORAL | 11 refills | Status: DC

## 2017-10-02 MED ORDER — AMLODIPINE BESYLATE 2.5 MG PO TABS: 2.5000 mg | ORAL_TABLET | Freq: Every day | ORAL | 3 refills | Status: DC

## 2017-10-02 NOTE — Patient Instructions (Addendum)
I have sent a prescription to your pharmacy, to help with the anxiety. blood tests are requested for you today.  We'll let you know about the results. I have sent a prescription to your pharmacy, to half the amlodipine Please consider these measures for your health:  minimize alcohol.  Do not use tobacco products.  Have a colonoscopy at least every 10 years from age 76.  Women should have an annual mammogram from age 51.  Keep firearms safely stored.  Always use seat belts.  have working smoke alarms in your home.  See an eye doctor and dentist regularly.  Never drive under the influence of alcohol or drugs (including prescription drugs).  Those with fair skin should take precautions against the sun, and should carefully examine their skin once per month, for any new or changed moles. It is critically important to prevent falling down (keep floor areas well-lit, dry, and free of loose objects.  If you have a cane, walker, or wheelchair, you should use it, even for short trips around the house.  Wear flat-soled shoes.  Also, try not to rush)
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
## 2017-10-02 NOTE — Progress Notes (Signed)
we discussed code status.  pt requests full code, but would not want to be started or maintained on artificial life-support measures if there was not a reasonable chance of recovery 

## 2017-10-05 ENCOUNTER — Other Ambulatory Visit: Payer: Self-pay | Admitting: Endocrinology

## 2017-10-05 DIAGNOSIS — D509 Iron deficiency anemia, unspecified: Secondary | ICD-10-CM

## 2017-10-06 ENCOUNTER — Telehealth: Payer: Self-pay | Admitting: Endocrinology

## 2017-10-06 ENCOUNTER — Other Ambulatory Visit (INDEPENDENT_AMBULATORY_CARE_PROVIDER_SITE_OTHER): Payer: Medicare Other

## 2017-10-06 ENCOUNTER — Other Ambulatory Visit: Payer: Self-pay | Admitting: Endocrinology

## 2017-10-06 DIAGNOSIS — H3562 Retinal hemorrhage, left eye: Secondary | ICD-10-CM | POA: Diagnosis not present

## 2017-10-06 DIAGNOSIS — H01022 Squamous blepharitis right lower eyelid: Secondary | ICD-10-CM | POA: Diagnosis not present

## 2017-10-06 DIAGNOSIS — H01025 Squamous blepharitis left lower eyelid: Secondary | ICD-10-CM | POA: Diagnosis not present

## 2017-10-06 DIAGNOSIS — D509 Iron deficiency anemia, unspecified: Secondary | ICD-10-CM

## 2017-10-06 DIAGNOSIS — H2513 Age-related nuclear cataract, bilateral: Secondary | ICD-10-CM | POA: Diagnosis not present

## 2017-10-06 DIAGNOSIS — H01024 Squamous blepharitis left upper eyelid: Secondary | ICD-10-CM | POA: Diagnosis not present

## 2017-10-06 DIAGNOSIS — H40013 Open angle with borderline findings, low risk, bilateral: Secondary | ICD-10-CM | POA: Diagnosis not present

## 2017-10-06 DIAGNOSIS — H01021 Squamous blepharitis right upper eyelid: Secondary | ICD-10-CM | POA: Diagnosis not present

## 2017-10-06 LAB — CBC WITH DIFFERENTIAL/PLATELET
BASOS ABS: 0.1 10*3/uL (ref 0.0–0.1)
Basophils Relative: 1.3 % (ref 0.0–3.0)
Eosinophils Absolute: 0.1 10*3/uL (ref 0.0–0.7)
Eosinophils Relative: 1.7 % (ref 0.0–5.0)
HCT: 25.1 % — ABNORMAL LOW (ref 39.0–52.0)
Hemoglobin: 7.8 g/dL — CL (ref 13.0–17.0)
LYMPHS ABS: 1.3 10*3/uL (ref 0.7–4.0)
Lymphocytes Relative: 25.1 % (ref 12.0–46.0)
MCHC: 30.9 g/dL (ref 30.0–36.0)
MCV: 70.8 fl — ABNORMAL LOW (ref 78.0–100.0)
MONO ABS: 0.5 10*3/uL (ref 0.1–1.0)
Monocytes Relative: 10.6 % (ref 3.0–12.0)
NEUTROS ABS: 3.1 10*3/uL (ref 1.4–7.7)
NEUTROS PCT: 61.3 % (ref 43.0–77.0)
PLATELETS: 230 10*3/uL (ref 150.0–400.0)
RBC: 3.55 Mil/uL — AB (ref 4.22–5.81)
RDW: 15.9 % — ABNORMAL HIGH (ref 11.5–15.5)
WBC: 5.1 10*3/uL (ref 4.0–10.5)

## 2017-10-06 LAB — IBC PANEL
IRON: 11 ug/dL — AB (ref 42–165)
SATURATION RATIOS: 2.3 % — AB (ref 20.0–50.0)
Transferrin: 339 mg/dL (ref 212.0–360.0)

## 2017-10-06 NOTE — Telephone Encounter (Signed)
please call short stay:  How soon can they get pt in for iron infusion?  I have ordered.  Can they see the order?

## 2017-10-07 ENCOUNTER — Other Ambulatory Visit: Payer: Self-pay | Admitting: Endocrinology

## 2017-10-07 ENCOUNTER — Encounter: Payer: Self-pay | Admitting: Endocrinology

## 2017-10-07 DIAGNOSIS — D509 Iron deficiency anemia, unspecified: Secondary | ICD-10-CM

## 2017-10-07 NOTE — Telephone Encounter (Signed)
I am not sure if Short Stay was out for lunch or just busy, but I called & left message for them to call back. I stated that I was trying to get patient scheduled for iron infusion. I asked for a call back.

## 2017-10-08 ENCOUNTER — Encounter: Payer: Self-pay | Admitting: Physician Assistant

## 2017-10-09 NOTE — Telephone Encounter (Signed)
First question: can they see the order If so, what specific iron type should I enter?

## 2017-10-09 NOTE — Telephone Encounter (Signed)
I called short stay & they need you to specify what type of iron.

## 2017-10-10 NOTE — Telephone Encounter (Signed)
I tried to call short stay again, but received VM.

## 2017-10-14 ENCOUNTER — Encounter: Payer: Self-pay | Admitting: Endocrinology

## 2017-10-14 NOTE — Telephone Encounter (Signed)
Patient is scheduled for the test dose of iron infusion, but those orders need to be put in according to short stay. The appointment date is 6/9 at 9:00 am & patient is unsure of where to go. I looked up short stay online, but I am unsure where it is in [facility]. Also patient states when I called to inform him of appointment that he was unsure of all this & having it done. He also wants a x-ray of his hip because he hurt it last weak. He is having constant pain.Please advise?

## 2017-10-14 NOTE — Telephone Encounter (Signed)
I need to know what type of iron infusion needs to be entered.

## 2017-10-14 NOTE — Telephone Encounter (Signed)
I tried to call back short stay at [phone], but did not receive an answer.

## 2017-10-15 ENCOUNTER — Telehealth: Payer: Self-pay | Admitting: Endocrinology

## 2017-10-15 NOTE — Telephone Encounter (Signed)
Short Stay stated that correct order was in, but the test dose needed to be ordered.

## 2017-10-15 NOTE — Telephone Encounter (Signed)
Ov tomorrow or next day, please

## 2017-10-15 NOTE — Telephone Encounter (Signed)
done

## 2017-10-15 NOTE — Telephone Encounter (Signed)
LVM for patient to call back to make appointment ASAP for xray.

## 2017-10-15 NOTE — Telephone Encounter (Signed)
-----   Message from Dorna Leitz, [place] sent at 10/15/2017  2:40 PM EDT ----- Regarding: X-ray of hip I see where an MR of lumbar spine was ordered with out contrast. Patient was inquiring though about a x-ray for hip?

## 2017-10-16 ENCOUNTER — Ambulatory Visit (INDEPENDENT_AMBULATORY_CARE_PROVIDER_SITE_OTHER): Payer: Medicare Other

## 2017-10-16 ENCOUNTER — Telehealth: Payer: Self-pay | Admitting: Endocrinology

## 2017-10-16 ENCOUNTER — Ambulatory Visit (INDEPENDENT_AMBULATORY_CARE_PROVIDER_SITE_OTHER): Payer: Medicare Other | Admitting: Surgery

## 2017-10-16 ENCOUNTER — Encounter (INDEPENDENT_AMBULATORY_CARE_PROVIDER_SITE_OTHER): Payer: Self-pay | Admitting: Surgery

## 2017-10-16 VITALS — BP 192/91 | HR 68 | Ht 68.0 in | Wt 130.0 lb

## 2017-10-16 DIAGNOSIS — I739 Peripheral vascular disease, unspecified: Secondary | ICD-10-CM

## 2017-10-16 DIAGNOSIS — M79651 Pain in right thigh: Secondary | ICD-10-CM

## 2017-10-16 NOTE — Telephone Encounter (Signed)
Patient stated that he do not need to make an appointment with Loanne Drilling because he already had his Xray today.

## 2017-10-16 NOTE — Progress Notes (Signed)
Office Visit Note   Patient: Ronnie Snyder           Date of Birth: Aug 27, 1940           MRN: [id number] Visit Date: 10/16/2017              Requested by: Renato Shin, MD [address] [place], [place] [address] PCP: Renato Shin, MD   Assessment & Plan: Visit Diagnoses:  1. Right thigh pain   2. PAD (peripheral artery disease) ([place])     Plan:  Patient was given IT band stretching exercises.  Given a new handicap placard.  Follow-up with Dr. Lorin Mercy in 6 weeks for recheck for his right lateral thigh pain.  Patient does have fairly extensive arterial calcification in the right lower extremity.  He has seen Dr. Joylene Igo in the past and I did recommend that he contact his office to see if further evaluation is warranted.  He did have lower extremity ABIs November 2018.  Follow-Up Instructions: Return in about 6 weeks (around 11/27/2017) for With Dr. Lorin Mercy for recheck.   Orders:  Orders Placed This Encounter  Procedures  . XR FEMUR, MIN 2 VIEWS RIGHT   No orders of the defined types were placed in this encounter.     Procedures: No procedures performed   Clinical Data: No additional findings.   Subjective: Chief Complaint  Patient presents with  . Right Thigh - Pain    HPI 77 year old white male comes in the office today with complaints of right lateral thigh pain.  Patient has had ORIF right hip procedure several years ago.  Localizes pain at the IT band right over the hardware.  No pain over the greater trochanter.  Pain has been off and on for about 6 days.  Problem is not limiting his activity.  Patient does have history of peripheral artery disease but is not describing any claudication symptoms at this time.  No lumbar spine or radicular component. Review of Systems No current cardiac pulmonary GI GU issues  Objective: Vital Signs: BP (!) 192/91 (BP Location: Right Arm, Patient Position: Sitting, Cuff Size: Normal)   Pulse 68   Ht  5\' 8"  (1.727 m)   Wt 130 lb (59 kg)   BMI 19.77 kg/m   Physical Exam  Ortho Exam  Specialty Comments:  No specialty comments available.  Imaging: No results found.   PMFS History: Patient Active Problem List   Diagnosis Date Noted  . Lumbar stenosis 05/23/2017  . Anemia 10/02/2016  . Pain in joint, lower leg 10/03/2015  . Ecchymosis 09/23/2013  . PAD (peripheral artery disease) ([place]) 08/09/2013  . Aftercare following surgery of the circulatory system, Micro 02/01/2013  . Pain in limb-Left calf 02/01/2013  . Hyponatremia 08/21/2012  . Smoker 08/13/2012  . Other and unspecified alcohol dependence, unspecified drinking behavior 08/13/2012  . Diabetes ([place]) 08/13/2012  . Screening for prostate cancer 08/13/2012  . Encounter for long-term (current) use of other medications 08/13/2012  . Pain of thigh 06/15/2012  . Atherosclerosis of native artery of extremity with intermittent claudication ([place]) 08/21/2011  . ECZEMA 01/24/2010  . SEBACEOUS CYST 01/24/2009  . COUGH 01/24/2009  . AAA (abdominal aortic aneurysm) ([place]) 12/06/2008  . HYPERCHOLESTEROLEMIA 01/21/2008  . ERECTILE DYSFUNCTION, ORGANIC 01/21/2008  . Back pain 01/21/2008  . 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 Anxiety state 03/07/2007  . ERECTILE DYSFUNCTION 03/07/2007  . SMOKER 03/07/2007  . DEPRESSION 03/07/2007  . Essential hypertension 03/07/2007  . CAROTID  ARTERY STENOSIS 03/07/2007  . GERD 03/07/2007  . HIP PAIN, RIGHT 03/07/2007  . COLONIC POLYPS, HX OF 03/07/2007  . ADENOIDECTOMY, HX OF 03/07/2007   Past Medical History:  Diagnosis Date  . Abdominal aortic aneurysm (HCC)    3.5 cm 10/2016 L-spine CT (previously 3.0 cm by U/S 02/25/11)  . Anxiety   . Arthritis    "in my feet" (10/13/2012)  . GERD (gastroesophageal reflux disease)   . H/O hiatal hernia   . Hepatic steatosis   . High cholesterol    "at one time; it's fine now" (10/13/2012)  . HTN (hypertension)   . Hx of colonic polyps   . Hypothyroidism     . PAD (peripheral artery disease) ([place])   . Skin cancer    "burned them off my arm and such" (10/13/2012)    Family History  Problem Relation Age of Onset  . Hypertension Mother   . Hypertension Father   . Hypertension Sister   . Hypertension Sister     Past Surgical History:  Procedure Laterality Date  . ABDOMINAL ANGIOGRAM N/A 12/04/2011   Procedure: ABDOMINAL ANGIOGRAM;  Surgeon: Sherren Mocha, MD;  Location: [facility] C[facility] LAB;  Service: Cardiovascular;  Laterality: N/A;  . ABDOMINAL AORTAGRAM N/A 10/13/2012   Procedure: ABDOMINAL Maxcine Ham;  Surgeon: Serafina Mitchell, MD;  Location: [facility] CATH LAB;  Service: Cardiovascular;  Laterality: N/A;  . ANGIOPLASTY / STENTING FEMORAL Left 10/13/2012  . ESOPHAGOGASTRODUODENOSCOPY    . FEMORAL ENDARTERECTOMY Left 01/23/12   Left Endarterectomy  with bovie patch Angioplasy  . gated spect wall motion stress cardiolite  02/10/2002  . HIP FRACTURE SURGERY Right 1990's  . INGUINAL HERNIA REPAIR Right    "years ago" (10/13/2012)  . LUMBAR LAMINECTOMY/DECOMPRESSION MICRODISCECTOMY N/A 05/23/2017   Procedure: L4-5 DECOMPRESSION;  Surgeon: Marybelle Killings, MD;  Location: [facility];  Service: Orthopedics;  Laterality: N/A;  . NOSE SURGERY    . TONSILLECTOMY     "I was a chld" (10/13/2012)   Social History   Occupational History  . Occupation: retired    Comment: optician  Tobacco Use  . Smoking status: Light Tobacco Smoker    Packs/day: 0.25    Years: 57.00    Pack years: 14.25    Types: Cigarettes  . Smokeless tobacco: Never Used  . Tobacco comment: pt states there is no hope for quitting  Substance and Sexual Activity  . Alcohol use: Yes    Alcohol/week: 3.0 oz    Types: 5 Cans of beer per week  . Drug use: No  . Sexual activity: Never

## 2017-10-17 ENCOUNTER — Telehealth (INDEPENDENT_AMBULATORY_CARE_PROVIDER_SITE_OTHER): Payer: Self-pay | Admitting: Surgery

## 2017-10-17 NOTE — Telephone Encounter (Signed)
Patient is currently at the pharmacy, Amber called saying that the patient was prescribed some type of ointment and the pharmacy hasn't received it. If you could give [facility] a call back at [phone]

## 2017-10-17 NOTE — Telephone Encounter (Signed)
Patient called again, would like a call back as soon as possible.

## 2017-10-17 NOTE — Telephone Encounter (Signed)
I spoke with Ronnie Snyder. He was going go give patient samples of Pennsaid yesterday. I called patient and advised how to use. Samples at front desk for him to pick up.

## 2017-10-17 NOTE — Telephone Encounter (Signed)
I called and spoke with patient. I do not see anything in note from yesterdays visit being prescribed. I will try to contact Ronnie Snyder and see what needs to be called in.  I did explain that he is in surgery with Dr. Lorin Mercy this morning.

## 2017-10-20 ENCOUNTER — Ambulatory Visit (HOSPITAL_COMMUNITY)
Admission: RE | Admit: 2017-10-20 | Discharge: 2017-10-20 | Disposition: A | Discharge status: Home / Self Care | Payer: Medicare Other | Source: Ambulatory Visit | Attending: Endocrinology | Admitting: Endocrinology

## 2017-10-20 DIAGNOSIS — D509 Iron deficiency anemia, unspecified: Secondary | ICD-10-CM

## 2017-10-20 MED ORDER — EPOETIN ALFA 10000 UNIT/ML IJ SOLN
INTRAMUSCULAR | Status: AC
  Filled 2017-10-20: qty 1

## 2017-10-20 MED ORDER — SODIUM CHLORIDE 0.9 % IV SOLN
500.0000 mg | Freq: Every day | INTRAVENOUS | Status: DC
  Administered 2017-10-20: 500 mg via INTRAVENOUS
  Filled 2017-10-20 (×2): qty 10

## 2017-10-20 MED ORDER — SODIUM CHLORIDE 0.9 % IV SOLN
25.0000 mg | Freq: Once | INTRAVENOUS | Status: AC
  Administered 2017-10-20: 25 mg via INTRAVENOUS
  Filled 2017-10-20: qty 0.5

## 2017-10-20 MED ORDER — IRON DEXTRAN 50 MG/ML IJ SOLN
500.0000 mg | Freq: Once | INTRAMUSCULAR | Status: DC
  Filled 2017-10-20: qty 10

## 2017-10-20 NOTE — Discharge Instructions (Signed)

## 2017-10-29 ENCOUNTER — Encounter: Payer: Self-pay | Admitting: Endocrinology

## 2017-10-30 ENCOUNTER — Encounter (INDEPENDENT_AMBULATORY_CARE_PROVIDER_SITE_OTHER): Payer: Self-pay | Admitting: Orthopaedic Surgery

## 2017-10-30 ENCOUNTER — Other Ambulatory Visit: Payer: Self-pay | Admitting: Endocrinology

## 2017-10-30 DIAGNOSIS — D509 Iron deficiency anemia, unspecified: Secondary | ICD-10-CM

## 2017-10-31 ENCOUNTER — Other Ambulatory Visit (INDEPENDENT_AMBULATORY_CARE_PROVIDER_SITE_OTHER): Payer: Medicare Other

## 2017-10-31 DIAGNOSIS — D509 Iron deficiency anemia, unspecified: Secondary | ICD-10-CM | POA: Diagnosis not present

## 2017-10-31 LAB — CBC WITH DIFFERENTIAL/PLATELET
Basophils Absolute: 0.1 10*3/uL (ref 0.0–0.1)
Basophils Relative: 2.1 % (ref 0.0–3.0)
EOS ABS: 0.1 10*3/uL (ref 0.0–0.7)
EOS PCT: 2.6 % (ref 0.0–5.0)
HEMATOCRIT: 33.8 % — AB (ref 39.0–52.0)
HEMOGLOBIN: 11 g/dL — AB (ref 13.0–17.0)
LYMPHS PCT: 27.3 % (ref 12.0–46.0)
Lymphs Abs: 0.9 10*3/uL (ref 0.7–4.0)
MCHC: 32.5 g/dL (ref 30.0–36.0)
MCV: 78.4 fl (ref 78.0–100.0)
Monocytes Absolute: 0.5 10*3/uL (ref 0.1–1.0)
Monocytes Relative: 13.1 % — ABNORMAL HIGH (ref 3.0–12.0)
Neutro Abs: 1.9 10*3/uL (ref 1.4–7.7)
Neutrophils Relative %: 54.9 % (ref 43.0–77.0)
Platelets: 173 10*3/uL (ref 150.0–400.0)
RBC: 4.32 Mil/uL (ref 4.22–5.81)
RDW: 26.8 % — AB (ref 11.5–15.5)
WBC: 3.5 10*3/uL — AB (ref 4.0–10.5)

## 2017-10-31 LAB — IBC PANEL
Iron: 97 ug/dL (ref 42–165)
Saturation Ratios: 27 % (ref 20.0–50.0)
Transferrin: 257 mg/dL (ref 212.0–360.0)

## 2017-11-04 ENCOUNTER — Other Ambulatory Visit: Payer: Self-pay | Admitting: Endocrinology

## 2017-11-04 DIAGNOSIS — D509 Iron deficiency anemia, unspecified: Secondary | ICD-10-CM

## 2017-11-07 ENCOUNTER — Ambulatory Visit: Payer: Medicare Other | Admitting: Physician Assistant

## 2017-11-17 DIAGNOSIS — H01022 Squamous blepharitis right lower eyelid: Secondary | ICD-10-CM | POA: Diagnosis not present

## 2017-11-17 DIAGNOSIS — H40013 Open angle with borderline findings, low risk, bilateral: Secondary | ICD-10-CM | POA: Diagnosis not present

## 2017-11-17 DIAGNOSIS — H01021 Squamous blepharitis right upper eyelid: Secondary | ICD-10-CM | POA: Diagnosis not present

## 2017-11-17 DIAGNOSIS — H01024 Squamous blepharitis left upper eyelid: Secondary | ICD-10-CM | POA: Diagnosis not present

## 2017-11-17 DIAGNOSIS — H01025 Squamous blepharitis left lower eyelid: Secondary | ICD-10-CM | POA: Diagnosis not present

## 2017-11-17 DIAGNOSIS — H2513 Age-related nuclear cataract, bilateral: Secondary | ICD-10-CM | POA: Diagnosis not present

## 2017-11-17 DIAGNOSIS — H3562 Retinal hemorrhage, left eye: Secondary | ICD-10-CM | POA: Diagnosis not present

## 2017-11-19 ENCOUNTER — Encounter (INDEPENDENT_AMBULATORY_CARE_PROVIDER_SITE_OTHER): Payer: Self-pay | Admitting: Surgery

## 2017-11-19 ENCOUNTER — Ambulatory Visit (INDEPENDENT_AMBULATORY_CARE_PROVIDER_SITE_OTHER): Payer: Medicare Other | Admitting: Surgery

## 2017-11-19 VITALS — BP 155/76 | HR 65 | Ht 68.0 in | Wt 134.5 lb

## 2017-11-19 DIAGNOSIS — M79651 Pain in right thigh: Secondary | ICD-10-CM

## 2017-11-19 NOTE — Progress Notes (Signed)
77 year old white male returns for recheck of right lateral hip pain.  States that this is doing much better.  Has been doing some stretching exercises and also used the pen said samples that were given.  Walking better.  Has some soreness when he lays on his right side but again not as bad.  States that his back is doing great.  No issues there.  Exam Very pleasant white male alert and oriented in no acute distress.  He is nontender over the right hip greater trochanter.  Neurovas intact.  Plan Again encourage patient to do IT band stretching exercises.  Try to avoid putting direct pressure over his right lateral hip.  Follow-up with [place] in 6 months for recheck.  If hip soreness returns and has pain medially over the greater trochanter he will call let me know and I will schedule him to come in for an injection.  All questions answered.  Has an appointment with Dr. Lorin Mercy in a couple weeks and I will cancel that visit.

## 2017-11-28 ENCOUNTER — Encounter: Payer: Self-pay | Admitting: Endocrinology

## 2017-12-01 ENCOUNTER — Other Ambulatory Visit (INDEPENDENT_AMBULATORY_CARE_PROVIDER_SITE_OTHER): Payer: Medicare Other

## 2017-12-01 DIAGNOSIS — D509 Iron deficiency anemia, unspecified: Secondary | ICD-10-CM | POA: Diagnosis not present

## 2017-12-01 LAB — CBC WITH DIFFERENTIAL/PLATELET
BASOS ABS: 0.1 10*3/uL (ref 0.0–0.1)
BASOS PCT: 1 % (ref 0.0–3.0)
Eosinophils Absolute: 0.2 10*3/uL (ref 0.0–0.7)
Eosinophils Relative: 2.5 % (ref 0.0–5.0)
HCT: 36 % — ABNORMAL LOW (ref 39.0–52.0)
Hemoglobin: 11.9 g/dL — ABNORMAL LOW (ref 13.0–17.0)
LYMPHS ABS: 1.3 10*3/uL (ref 0.7–4.0)
Lymphocytes Relative: 18.9 % (ref 12.0–46.0)
MCHC: 32.9 g/dL (ref 30.0–36.0)
MCV: 82.5 fl (ref 78.0–100.0)
Monocytes Absolute: 0.7 10*3/uL (ref 0.1–1.0)
Monocytes Relative: 9.8 % (ref 3.0–12.0)
NEUTROS ABS: 4.6 10*3/uL (ref 1.4–7.7)
NEUTROS PCT: 67.8 % (ref 43.0–77.0)
PLATELETS: 176 10*3/uL (ref 150.0–400.0)
RBC: 4.37 Mil/uL (ref 4.22–5.81)
RDW: 26.1 % — AB (ref 11.5–15.5)
WBC: 6.8 10*3/uL (ref 4.0–10.5)

## 2017-12-01 LAB — IBC PANEL
IRON: 133 ug/dL (ref 42–165)
Saturation Ratios: 38.5 % (ref 20.0–50.0)
TRANSFERRIN: 247 mg/dL (ref 212.0–360.0)

## 2017-12-03 ENCOUNTER — Ambulatory Visit (INDEPENDENT_AMBULATORY_CARE_PROVIDER_SITE_OTHER): Payer: Medicare Other | Admitting: Orthopaedic Surgery

## 2018-01-08 ENCOUNTER — Encounter: Payer: Self-pay | Admitting: Endocrinology

## 2018-01-10 ENCOUNTER — Encounter: Payer: Self-pay | Admitting: Endocrinology

## 2018-01-21 ENCOUNTER — Encounter: Payer: Medicare Other | Admitting: Family

## 2018-01-22 ENCOUNTER — Encounter: Payer: Self-pay | Admitting: Endocrinology

## 2018-01-22 ENCOUNTER — Ambulatory Visit (INDEPENDENT_AMBULATORY_CARE_PROVIDER_SITE_OTHER): Payer: Medicare Other | Admitting: Endocrinology

## 2018-01-22 VITALS — BP 130/74 | HR 94 | Ht 68.0 in | Wt 140.6 lb

## 2018-01-22 DIAGNOSIS — E1151 Type 2 diabetes mellitus with diabetic peripheral angiopathy without gangrene: Secondary | ICD-10-CM

## 2018-01-22 DIAGNOSIS — R739 Hyperglycemia, unspecified: Secondary | ICD-10-CM | POA: Insufficient documentation

## 2018-01-22 DIAGNOSIS — D509 Iron deficiency anemia, unspecified: Secondary | ICD-10-CM | POA: Diagnosis not present

## 2018-01-22 LAB — CBC WITH DIFFERENTIAL/PLATELET
BASOS ABS: 0.1 10*3/uL (ref 0.0–0.1)
Basophils Relative: 1 % (ref 0.0–3.0)
EOS PCT: 2.6 % (ref 0.0–5.0)
Eosinophils Absolute: 0.2 10*3/uL (ref 0.0–0.7)
HCT: 37.1 % — ABNORMAL LOW (ref 39.0–52.0)
HEMOGLOBIN: 12.4 g/dL — AB (ref 13.0–17.0)
Lymphocytes Relative: 26.6 % (ref 12.0–46.0)
Lymphs Abs: 1.6 10*3/uL (ref 0.7–4.0)
MCHC: 33.4 g/dL (ref 30.0–36.0)
MCV: 90 fl (ref 78.0–100.0)
MONOS PCT: 10.2 % (ref 3.0–12.0)
Monocytes Absolute: 0.6 10*3/uL (ref 0.1–1.0)
Neutro Abs: 3.5 10*3/uL (ref 1.4–7.7)
Neutrophils Relative %: 59.6 % (ref 43.0–77.0)
Platelets: 163 10*3/uL (ref 150.0–400.0)
RBC: 4.12 Mil/uL — AB (ref 4.22–5.81)
RDW: 15.4 % (ref 11.5–15.5)
WBC: 5.9 10*3/uL (ref 4.0–10.5)

## 2018-01-22 LAB — IBC PANEL
Iron: 100 ug/dL (ref 42–165)
SATURATION RATIOS: 23.6 % (ref 20.0–50.0)
TRANSFERRIN: 303 mg/dL (ref 212.0–360.0)

## 2018-01-22 LAB — POCT GLYCOSYLATED HEMOGLOBIN (HGB A1C): Hemoglobin A1C: 5.3 % (ref 4.0–5.6)

## 2018-01-22 MED ORDER — ALPRAZOLAM 0.25 MG PO TABS: 0.2500 mg | ORAL_TABLET | Freq: Two times a day (BID) | ORAL | 0 refills | Status: DC | PRN

## 2018-01-22 NOTE — Patient Instructions (Addendum)
Please continue the same medication. blood tests are requested for you today.  We'll let you know about the results. Best wished with your new primary care provider.

## 2018-01-22 NOTE — Progress Notes (Signed)
Subjective:    Patient ID: Ronnie Snyder, male    DOB: [birth year], 77 y.o.   MRN: [id number]  HPI  The state of at least three ongoing medical problems is addressed today, with interval history of each noted here: HTN: he denies chest pain and sob. This is a stable problem. Anemia: he denies BRBPR, and fatigue.  This is a stable problem. Anxiety: Xanax works well.  This is a stable problem. Past Medical History:  Diagnosis Date  . Abdominal aortic aneurysm (HCC)    3.5 cm 10/2016 L-spine CT (previously 3.0 cm by U/S 02/25/11)  . Anxiety   . Arthritis    "in my feet" (10/13/2012)  . GERD (gastroesophageal reflux disease)   . H/O hiatal hernia   . Hepatic steatosis   . High cholesterol    "at one time; it's fine now" (10/13/2012)  . HTN (hypertension)   . Hx of colonic polyps   . Hypothyroidism   . PAD (peripheral artery disease) ([place])   . Skin cancer    "burned them off my arm and such" (10/13/2012)    Past Surgical History:  Procedure Laterality Date  . ABDOMINAL ANGIOGRAM N/A 12/04/2011   Procedure: ABDOMINAL ANGIOGRAM;  Surgeon: Sherren Mocha, MD;  Location: [facility] CATH LAB;  Service: Cardiovascular;  Laterality: N/A;  . ABDOMINAL AORTAGRAM N/A 10/13/2012   Procedure: ABDOMINAL Maxcine Ham;  Surgeon: Serafina Mitchell, MD;  Location: [facility] CATH LAB;  Service: Cardiovascular;  Laterality: N/A;  . ANGIOPLASTY / STENTING FEMORAL Left 10/13/2012  . ESOPHAGOGASTRODUODENOSCOPY    . FEMORAL ENDARTERECTOMY Left 01/23/12   Left Endarterectomy  with bovie patch Angioplasy  . gated spect wall motion stress cardiolite  02/10/2002  . HIP FRACTURE SURGERY Right 1990's  . INGUINAL HERNIA REPAIR Right    "years ago" (10/13/2012)  . LUMBAR LAMINECTOMY/DECOMPRESSION MICRODISCECTOMY N/A 05/23/2017   Procedure: L4-5 DECOMPRESSION;  Surgeon: Marybelle Killings, MD;  Location: [facility];  Service: Orthopedics;  Laterality: N/A;  . NOSE SURGERY    . TONSILLECTOMY     "I was a chld" (10/13/2012)    Social History    Socioeconomic History  . Marital status: Married    Spouse name: Not on file  . Number of children: Not on file  . Years of education: Not on file  . Highest education level: Not on file  Occupational History  . Occupation: retired    Comment: optician  Social Needs  . Financial resource strain: Not on file  . Food insecurity:    Worry: Not on file    Inability: Not on file  . Transportation needs:    Medical: Not on file    Non-medical: Not on file  Tobacco Use  . Smoking status: Light Tobacco Smoker    Packs/day: 0.25    Years: 57.00    Pack years: 14.25    Types: Cigarettes  . Smokeless tobacco: Never Used  . Tobacco comment: pt states there is no hope for quitting  Substance and Sexual Activity  . Alcohol use: Yes    Alcohol/week: 5.0 standard drinks    Types: 5 Cans of beer per week  . Drug use: No  . Sexual activity: Never  Lifestyle  . Physical activity:    Days per week: Not on file    Minutes per session: Not on file  . Stress: Not on file  Relationships  . Social connections:    Talks on phone: Not on file    Gets together: Not  on file    Attends religious service: Not on file    Active member of club or organization: Not on file    Attends meetings of clubs or organizations: Not on file    Relationship status: Not on file  . Intimate partner violence:    Fear of current or ex partner: Not on file    Emotionally abused: Not on file    Physically abused: Not on file    Forced sexual activity: Not on file  Other Topics Concern  . Not on file  Social History Narrative  . Not on file    Current Outpatient Medications on File Prior to Visit  Medication Sig Dispense Refill  . amLODipine (NORVASC) 2.5 MG tablet Take 1 tablet (2.5 mg total) by mouth daily. 90 tablet 3  . clopidogrel (PLAVIX) 75 MG tablet take 1 tablet by mouth once daily 30 tablet 6  . clotrimazole-betamethasone (LOTRISONE) cream Apply 1 application topically 3 (three) times daily as  needed. For rash 45 g 2  . iron dextran complex 25 mg in sodium chloride 0.9 % 50 mL Inject 25 mg into the vein once.    Marland Kitchen levothyroxine (SYNTHROID, LEVOTHROID) 75 MCG tablet TAKE 1 TABLET DAILY BEFORE BREAKFAST. 90 tablet 3  . losartan (COZAAR) 100 MG tablet Take 1 tablet (100 mg total) by mouth daily. 90 tablet 3  . venlafaxine XR (EFFEXOR-XR) 37.5 MG 24 hr capsule Take 1 capsule (37.5 mg total) by mouth daily with breakfast. 30 capsule 11   No current facility-administered medications on file prior to visit.     Allergies  Allergen Reactions  . Other     UNSPECIFIED REACTION  "ALL PAINS/NARCOTIC MEDS-CANNOT TOLERATE WELL" > per Med History prior to 05/23/17  . Atorvastatin Nausea Only  . Penicillins Rash    Has patient had a PCN reaction causing immediate rash, facial/tongue/throat swelling, SOB or lightheadedness with hypotension: Unknown Has patient had a PCN reaction causing severe rash involving mucus membranes or skin necrosis: No Has patient had a PCN reaction that required hospitalization: No Has patient had a PCN reaction occurring within the last 10 years: No If all of the above answers are "NO", then may proceed with Cephalosporin use.   . Pioglitazone Nausea Only  . Vicodin [Hydrocodone-Acetaminophen] Nausea Only    Family History  Problem Relation Age of Onset  . Hypertension Mother   . Hypertension Father   . Hypertension Sister   . Hypertension Sister     BP 130/74 (BP Location: Right Arm)   Pulse 94   Ht 5\' 8"  (1.727 m)   Wt 140 lb 9.6 oz (63.8 kg)   SpO2 94%   BMI 21.38 kg/m   Review of Systems He has gained a few lbs.  He denies hematuria.     Objective:   Physical Exam VITAL SIGNS:  See vs page GENERAL: no distress Ext: no leg edema  Lab Results  Component Value Date   HGBA1C 5.3 01/22/2018      Assessment & Plan:  Hyperglycemia: well-controlled.  we'll follow Anxiety: well-controlled.  Please continue the same medication HTN:  well-controlled. Please continue the same medication.   Anemia: recheck today.     Patient Instructions  Please continue the same medication. blood tests are requested for you today.  We'll let you know about the results. Best wished with your new primary care provider.

## 2018-02-18 ENCOUNTER — Encounter: Payer: Self-pay | Admitting: Endocrinology

## 2018-02-23 ENCOUNTER — Encounter: Payer: Self-pay | Admitting: Endocrinology

## 2018-02-24 ENCOUNTER — Ambulatory Visit (INDEPENDENT_AMBULATORY_CARE_PROVIDER_SITE_OTHER): Payer: Medicare Other

## 2018-02-24 DIAGNOSIS — Z23 Encounter for immunization: Secondary | ICD-10-CM

## 2018-02-24 NOTE — Progress Notes (Signed)
Per orders of Dr. Renato Shin injection of high dose flu given today by Lolita Rieger RMA . Patient tolerated injection well.

## 2018-05-27 ENCOUNTER — Ambulatory Visit (INDEPENDENT_AMBULATORY_CARE_PROVIDER_SITE_OTHER): Payer: Medicare Other | Admitting: Orthopaedic Surgery

## 2018-05-29 ENCOUNTER — Encounter: Payer: Self-pay | Admitting: Family Medicine

## 2018-05-29 ENCOUNTER — Ambulatory Visit (INDEPENDENT_AMBULATORY_CARE_PROVIDER_SITE_OTHER): Payer: Medicare Other | Admitting: Family Medicine

## 2018-05-29 VITALS — BP 168/96 | HR 68 | Temp 97.8°F | Ht 68.0 in | Wt 147.0 lb

## 2018-05-29 DIAGNOSIS — I739 Peripheral vascular disease, unspecified: Secondary | ICD-10-CM | POA: Diagnosis not present

## 2018-05-29 DIAGNOSIS — D509 Iron deficiency anemia, unspecified: Secondary | ICD-10-CM | POA: Diagnosis not present

## 2018-05-29 DIAGNOSIS — I10 Essential (primary) hypertension: Secondary | ICD-10-CM | POA: Diagnosis not present

## 2018-05-29 DIAGNOSIS — E038 Other specified hypothyroidism: Secondary | ICD-10-CM

## 2018-05-29 LAB — BASIC METABOLIC PANEL
BUN: 9 mg/dL (ref 6–23)
CO2: 29 mEq/L (ref 19–32)
Calcium: 9.6 mg/dL (ref 8.4–10.5)
Chloride: 98 mEq/L (ref 96–112)
Creatinine, Ser: 0.6 mg/dL (ref 0.40–1.50)
GFR: 139.01 mL/min (ref >60.00)
Glucose, Bld: 107 mg/dL — ABNORMAL HIGH (ref 70–99)
POTASSIUM: 4.7 meq/L (ref 3.5–5.1)
Sodium: 136 mEq/L (ref 135–145)

## 2018-05-29 LAB — CBC
HCT: 39.8 % (ref 39.0–52.0)
HEMOGLOBIN: 13.5 g/dL (ref 13.0–17.0)
MCHC: 33.9 g/dL (ref 30.0–36.0)
MCV: 93.9 fl (ref 78.0–100.0)
Platelets: 164 10*3/uL (ref 150.0–400.0)
RBC: 4.24 Mil/uL (ref 4.22–5.81)
RDW: 13.9 % (ref 11.5–15.5)
WBC: 5.8 10*3/uL (ref 4.0–10.5)

## 2018-05-29 LAB — TSH: TSH: 5.06 u[IU]/mL — ABNORMAL HIGH (ref 0.35–4.50)

## 2018-05-29 LAB — FERRITIN: Ferritin: 25.3 ng/mL (ref 22.0–322.0)

## 2018-05-29 MED ORDER — LOSARTAN POTASSIUM 100 MG PO TABS: 100.0000 mg | ORAL_TABLET | Freq: Every day | ORAL | 3 refills | Status: DC

## 2018-05-29 MED ORDER — LEVOTHYROXINE SODIUM 75 MCG PO TABS: 75.0000 ug | ORAL_TABLET | Freq: Every day | ORAL | 3 refills | Status: DC

## 2018-05-29 MED ORDER — CLOPIDOGREL BISULFATE 75 MG PO TABS: 75.0000 mg | ORAL_TABLET | Freq: Every day | ORAL | 6 refills | Status: DC

## 2018-05-29 NOTE — Assessment & Plan Note (Signed)
-  Having some increased claudication symptoms, has been off plavix for 1 year.  -Recommend f/u with vascular surgery, he'll restart plavix.

## 2018-05-29 NOTE — Assessment & Plan Note (Signed)
-  BP elevated today, restart losartan -Low sodium diet.

## 2018-05-29 NOTE — Assessment & Plan Note (Signed)
Check cbc and ferritin 

## 2018-05-29 NOTE — Patient Instructions (Signed)
-  Great to meet you today -Restart levothyroxine, losartan, clopidogrel.  You may use alprazolam as needed -I will see you back in about 3 months.  -I would recommend you follow up with your vascular specialist for increased leg fatigue.

## 2018-05-29 NOTE — Progress Notes (Signed)
Ronnie Snyder - 78 y.o. male MRN [id number]  Date of birth: 24-Oct-1940  Subjective Chief Complaint  Patient presents with  . 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     HPI Ronnie Snyder is a 78 y.o. male with history of HTN, PAD, hypothyroidism, and anxiety here today to establish with new PCP.  He is concerned about fatigue today.  He reports that he had back surgery about 1 year ago.  He stopped all medications shortly afterwards his surgery.  He has had anemia in the past and is no longer taking iron supplement.  He denies associated chest pain or shortness of breath.  He does report that legs feel "tired and sore" if walking for a prolonged period.  He admits to some increased stress being the caretaker for his wife.  Has used alprazolam in the past but has not taken recently.  Was also on effexor at one point but he isn't sure that this did a whole lot for him.    ROS:  A comprehensive ROS was completed and negative except as noted per HPI  Allergies  Allergen Reactions  . Other     UNSPECIFIED REACTION  "ALL PAINS/NARCOTIC MEDS-CANNOT TOLERATE WELL" > per Med History prior to 05/23/17  . Atorvastatin Nausea Only  . Penicillins Rash    Has patient had a PCN reaction causing immediate rash, facial/tongue/throat swelling, SOB or lightheadedness with hypotension: Unknown Has patient had a PCN reaction causing severe rash involving mucus membranes or skin necrosis: No Has patient had a PCN reaction that required hospitalization: No Has patient had a PCN reaction occurring within the last 10 years: No If all of the above answers are "NO", then may proceed with Cephalosporin use.   . Pioglitazone Nausea Only  . Vicodin [Hydrocodone-Acetaminophen] Nausea Only    Past Medical History:  Diagnosis Date  . Abdominal aortic aneurysm (HCC)    3.5 cm 10/2016 L-spine CT (previously 3.0 cm by U/S 02/25/11)  . Anxiety   . Arthritis    "in my feet"  (10/13/2012)  . GERD (gastroesophageal reflux disease)   . H/O hiatal hernia   . Hepatic steatosis   . High cholesterol    "at one time; it's fine now" (10/13/2012)  . HTN (hypertension)   . Hx of colonic polyps   . Hypothyroidism   . PAD (peripheral artery disease) ([place])   . Skin cancer    "burned them off my arm and such" (10/13/2012)    Past Surgical History:  Procedure Laterality Date  . ABDOMINAL ANGIOGRAM N/A 12/04/2011   Procedure: ABDOMINAL ANGIOGRAM;  Surgeon: Sherren Mocha, MD;  Location: [facility] CATH LAB;  Service: Cardiovascular;  Laterality: N/A;  . ABDOMINAL AORTAGRAM N/A 10/13/2012   Procedure: ABDOMINAL Maxcine Ham;  Surgeon: Serafina Mitchell, MD;  Location: [facility] CATH LAB;  Service: Cardiovascular;  Laterality: N/A;  . ANGIOPLASTY / STENTING FEMORAL Left 10/13/2012  . ESOPHAGOGASTRODUODENOSCOPY    . FEMORAL ENDARTERECTOMY Left 01/23/12   Left Endarterectomy  with bovie patch Angioplasy  . gated spect wall motion stress cardiolite  02/10/2002  . HIP FRACTURE SURGERY Right 1990's  . INGUINAL HERNIA REPAIR Right    "years ago" (10/13/2012)  . LUMBAR LAMINECTOMY/DECOMPRESSION MICRODISCECTOMY N/A 05/23/2017   Procedure: L4-5 DECOMPRESSION;  Surgeon: Marybelle Killings, MD;  Location: [place];  Service: Orthopedics;  Laterality: N/A;  . NOSE SURGERY    . TONSILLECTOMY     "I was a chld" (10/13/2012)  Social History   Socioeconomic History  . Marital status: Married    Spouse name: Not on file  . Number of children: Not on file  . Years of education: Not on file  . Highest education level: Not on file  Occupational History  . Occupation: retired    Comment: optician  Social Needs  . Financial resource strain: Not on file  . Food insecurity:    Worry: Not on file    Inability: Not on file  . Transportation needs:    Medical: Not on file    Non-medical: Not on file  Tobacco Use  . Smoking status: Light Tobacco Smoker    Packs/day: 0.25    Years: 57.00    Pack years: 14.25     Types: Cigarettes  . Smokeless tobacco: Never Used  . Tobacco comment: pt states there is no hope for quitting  Substance and Sexual Activity  . Alcohol use: Yes    Alcohol/week: 5.0 standard drinks    Types: 5 Cans of beer per week  . Drug use: No  . Sexual activity: Never  Lifestyle  . Physical activity:    Days per week: Not on file    Minutes per session: Not on file  . Stress: Not on file  Relationships  . Social connections:    Talks on phone: Not on file    Gets together: Not on file    Attends religious service: Not on file    Active member of club or organization: Not on file    Attends meetings of clubs or organizations: Not on file    Relationship status: Not on file  Other Topics Concern  . Not on file  Social History Narrative  . Not on file    Family History  Problem Relation Age of Onset  . Hypertension Mother   . Hypertension Father   . Hypertension Sister   . Hypertension Sister     Health Maintenance  Topic Date Due  . FOOT EXAM  10/02/2017  . OPHTHALMOLOGY EXAM  10/20/2018 (Originally 08/19/2017)  . HEMOGLOBIN A1C  07/23/2018  . TETANUS/TDAP  05/08/2027  . INFLUENZA VACCINE  Completed  . PNA vac Low Risk Adult  Completed    ----------------------------------------------------------------------------------------------------------------------------------------------------------------------------------------------------------------- Physical Exam BP (!) 168/96   Pulse 68   Temp 97.8 F (36.6 C) (Oral)   Ht 5\' 8"  (1.727 m)   Wt 147 lb (66.7 kg)   SpO2 97%   BMI 22.35 kg/m   Physical Exam Constitutional:      Appearance: Normal appearance. He is not ill-appearing.  HENT:     Head: Normocephalic and atraumatic.     Mouth/Throat:     Mouth: Mucous membranes are moist.  Eyes:     General: No scleral icterus. Cardiovascular:     Rate and Rhythm: Normal rate and regular rhythm.  Pulmonary:     Effort: Pulmonary effort is normal.     Breath  sounds: Normal breath sounds.  Lymphadenopathy:     Cervical: No cervical adenopathy.  Skin:    General: Skin is warm.  Neurological:     General: No focal deficit present.     Mental Status: He is alert.  Psychiatric:        Mood and Affect: Mood normal.        Behavior: Behavior normal.     ------------------------------------------------------------------------------------------------------------------------------------------------------------------------------------------------------------------- Assessment and Plan  Essential hypertension -BP elevated today, restart losartan -Low sodium diet.   PAD (peripheral artery disease) -Having some  increased claudication symptoms, has been off plavix for 1 year.  -Recommend f/u with vascular surgery, he'll restart plavix.   Anemia -Check cbc and ferritin  Other specified hypothyroidism -Recheck TSH, expect to be elevated as he has been off medication.  Recommend restarting levothyroxine.

## 2018-05-29 NOTE — Assessment & Plan Note (Signed)
-  Recheck TSH, expect to be elevated as he has been off medication.  Recommend restarting levothyroxine.

## 2018-06-02 ENCOUNTER — Ambulatory Visit: Payer: Medicare Other | Admitting: Family Medicine

## 2018-06-02 NOTE — Progress Notes (Signed)
-  TSH is a little elevated, not unexpected being off thyroid medication.  I would like to recheck this in about 8 weeks.  -Other labs look ok.

## 2018-06-04 ENCOUNTER — Encounter: Payer: Self-pay | Admitting: Family Medicine

## 2018-06-05 ENCOUNTER — Other Ambulatory Visit: Payer: Self-pay | Admitting: Family Medicine

## 2018-06-05 DIAGNOSIS — E038 Other specified hypothyroidism: Secondary | ICD-10-CM

## 2018-07-09 ENCOUNTER — Encounter: Payer: Self-pay | Admitting: Family Medicine

## 2018-08-24 ENCOUNTER — Telehealth: Payer: Self-pay

## 2018-08-24 NOTE — Telephone Encounter (Signed)
Copied from [place] [id number]. Topic: General - Other >> Aug 24, 2018  3:50 PM Celene Kras A wrote: Reason for CRM: Pt called and is requesting a blood test be done before his appt on 08/31/2018. Pt would like to have the results by the time he has his appt. Pt states he had a blood test scheduled for before, but it was cancelled. Please advise.

## 2018-08-27 ENCOUNTER — Other Ambulatory Visit (INDEPENDENT_AMBULATORY_CARE_PROVIDER_SITE_OTHER): Payer: Medicare Other

## 2018-08-27 DIAGNOSIS — E038 Other specified hypothyroidism: Secondary | ICD-10-CM

## 2018-08-27 LAB — TSH: TSH: 4.26 mIU/L (ref 0.40–4.50)

## 2018-08-27 NOTE — Telephone Encounter (Signed)
Future orders entered previously.

## 2018-08-27 NOTE — Addendum Note (Signed)
Addended by: Lynnea Ferrier on: 08/27/2018 02:41 PM   Modules accepted: Orders

## 2018-08-27 NOTE — Telephone Encounter (Signed)
Pt called. Scheduled for 08/27/2018. Task completed.

## 2018-08-28 ENCOUNTER — Telehealth: Payer: Self-pay | Admitting: Family Medicine

## 2018-08-28 ENCOUNTER — Ambulatory Visit: Payer: Medicare Other | Admitting: Family Medicine

## 2018-08-28 NOTE — Telephone Encounter (Signed)
Called to verify appointment for Monday.

## 2018-08-29 IMAGING — DX DG CHEST 2V
2 series · 2 of 2 positions shown · non-contrast
Comparison: 07/07/2017; 09/23/2013

CLINICAL DATA: Follow-up posterior lower lobe opacity.

EXAM:
CHEST  2 VIEW

[dg chest 2 view (1 of 2)]
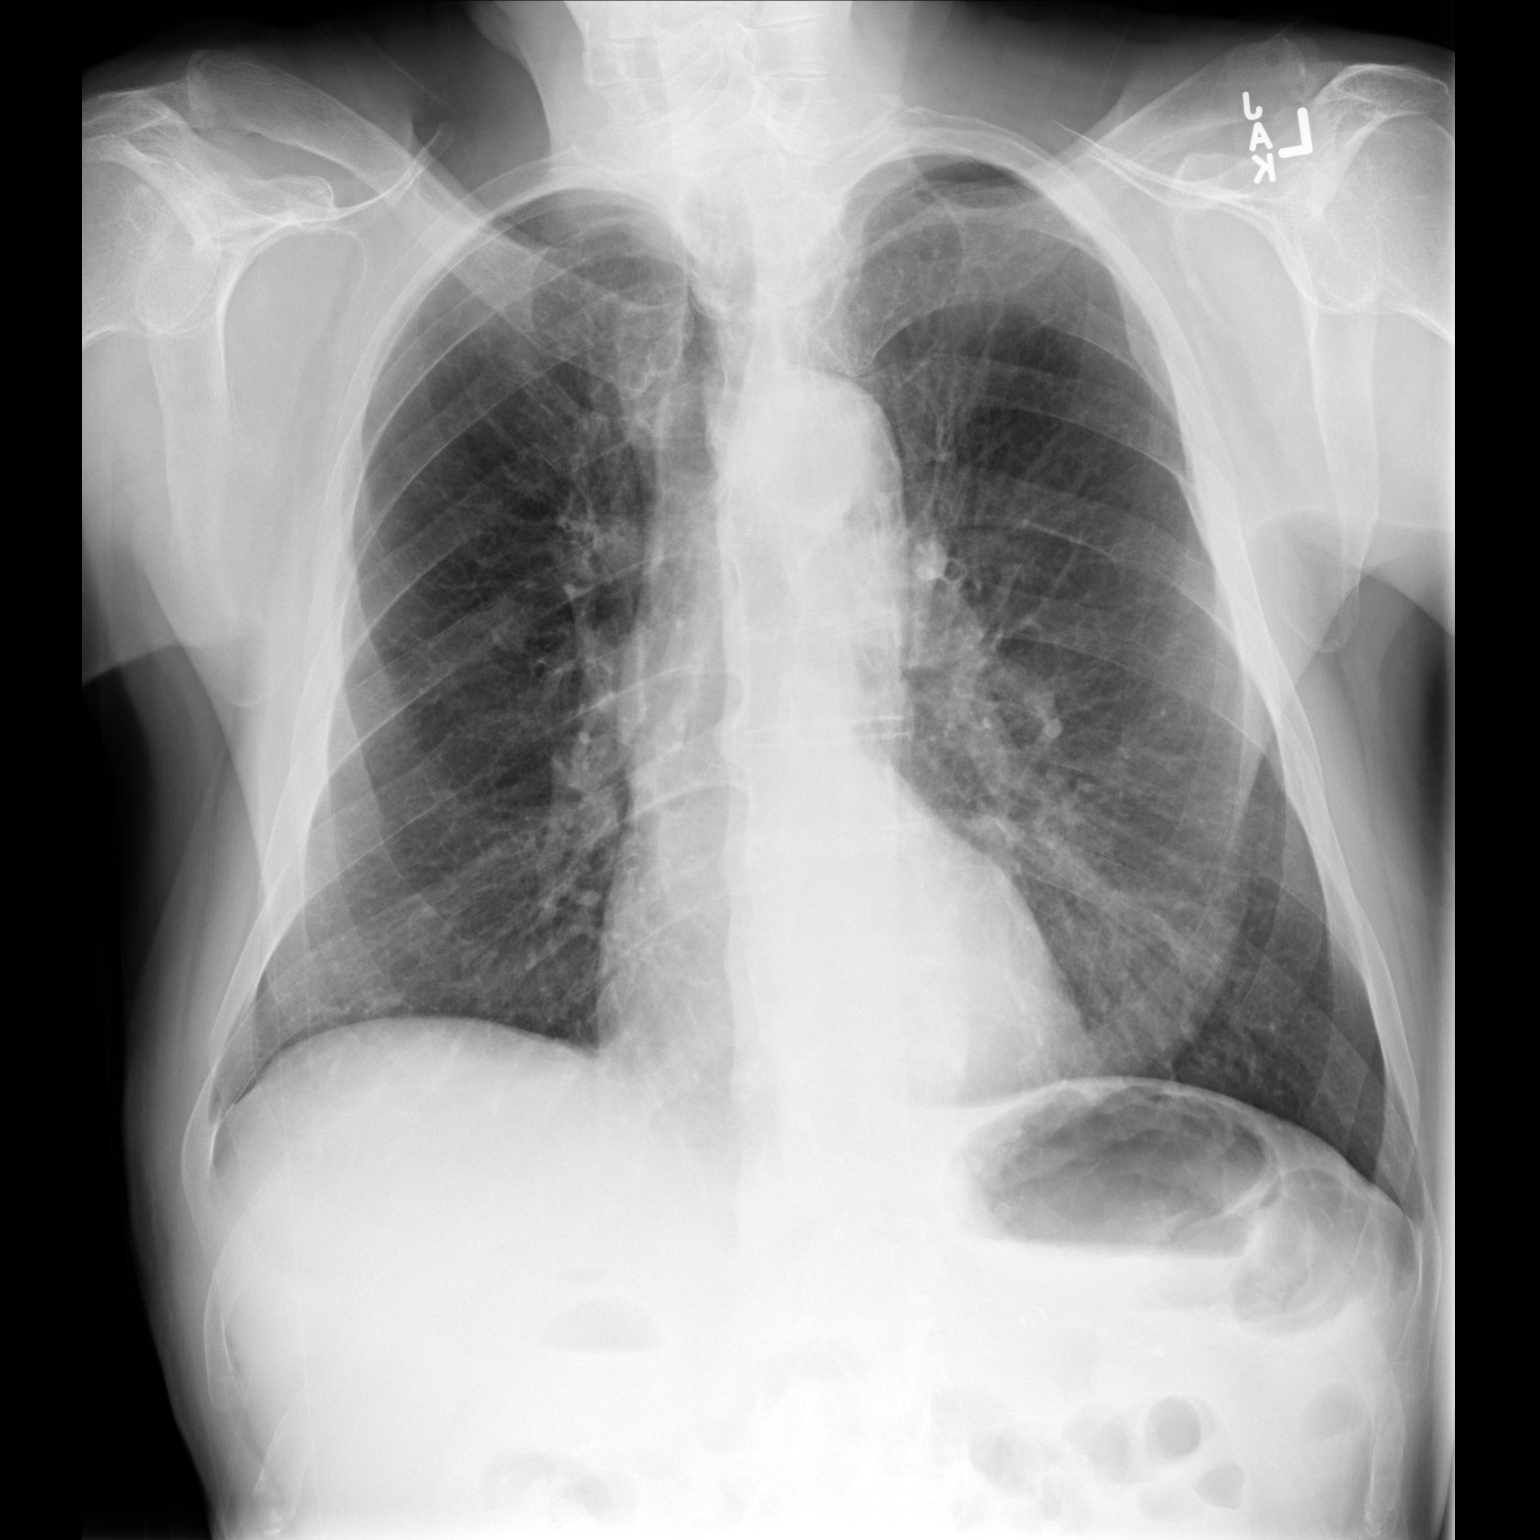

[dg chest 2 view (2 of 2)]
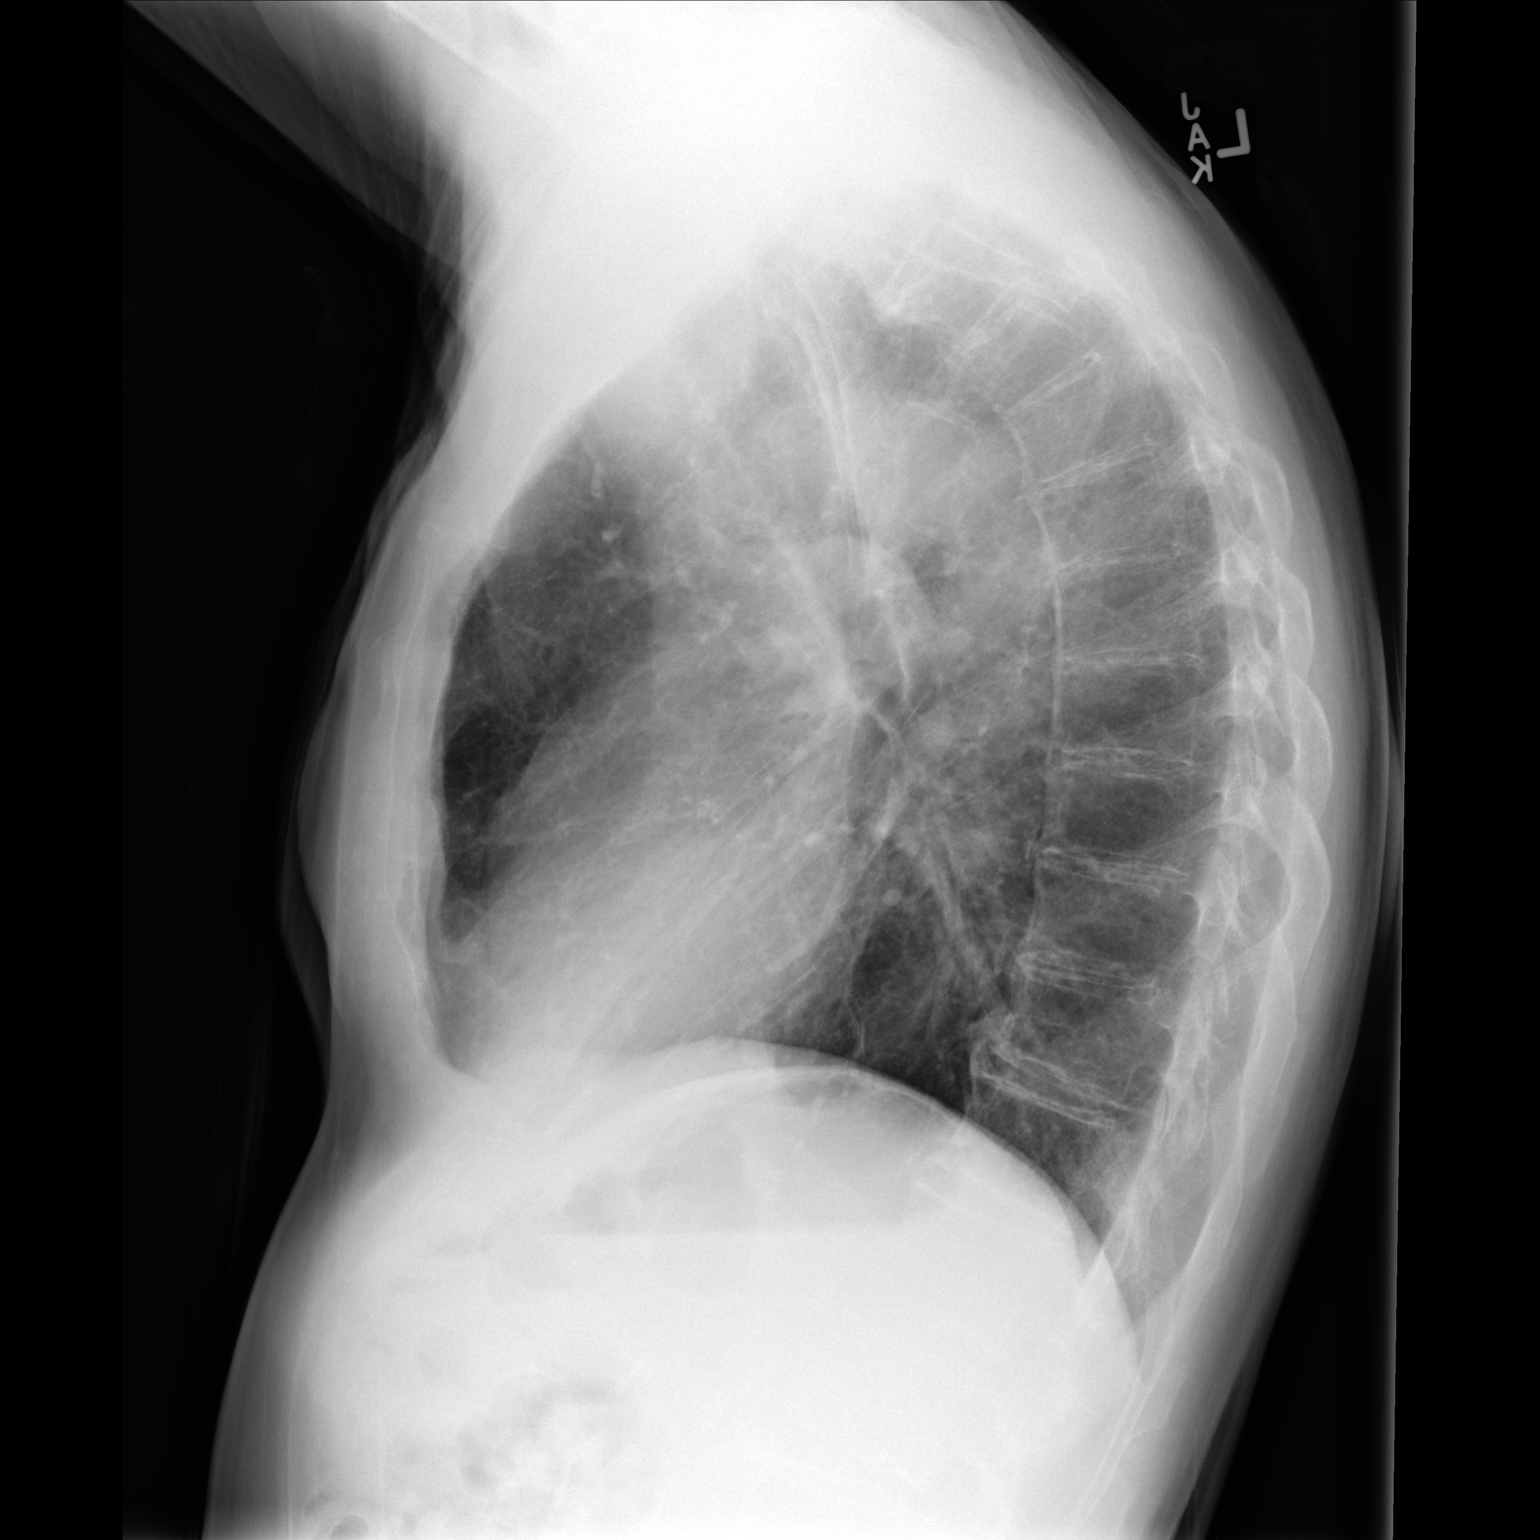

[2 of 2 positions shown; findings below may reference images not displayed]

FINDINGS: Grossly unchanged cardiac silhouette and mediastinal contours with
atherosclerotic plaque within the thoracic aorta. Questioned
posterior lobe opacity has resolved in the interval and thus favored
to represent either atelectasis versus a summation shadow due to
overlying osseous and soft tissue structures.

The lungs remain hyperexpanded with flattening the diaphragms. There
is mild diffuse slightly nodular thickening of the pulmonary
interstitium. No pleural effusion or pneumothorax. No evidence of
edema.

Stigmata of DISH throughout the thoracic spine. Old fractures
involving the posterolateral aspects of the right 6th and 7th ribs.
IMPRESSION: 1. Posterior lower lobe opacity has resolved in the interval and
thus favored to have represented either atelectasis versus a
summation shadow due to overlying osseous and soft tissue
structures.
2. Hyperexpanded lungs and bronchitic change without superimposed
acute cardiopulmonary disease.

## 2018-08-31 ENCOUNTER — Encounter: Payer: Self-pay | Admitting: Family Medicine

## 2018-08-31 ENCOUNTER — Ambulatory Visit (INDEPENDENT_AMBULATORY_CARE_PROVIDER_SITE_OTHER): Payer: Medicare Other | Admitting: Family Medicine

## 2018-08-31 VITALS — HR 80 | Wt 145.0 lb

## 2018-08-31 DIAGNOSIS — I10 Essential (primary) hypertension: Secondary | ICD-10-CM | POA: Diagnosis not present

## 2018-08-31 DIAGNOSIS — E038 Other specified hypothyroidism: Secondary | ICD-10-CM | POA: Diagnosis not present

## 2018-08-31 NOTE — Assessment & Plan Note (Signed)
-  Stable, continue current medication.  -Encouraged to monitor occasionally at home.

## 2018-08-31 NOTE — Progress Notes (Signed)
Ronnie Snyder - 78 y.o. male MRN [id number]  Date of birth: [birth year]   This visit type was conducted due to national recommendations for restrictions regarding the COVID-19 Pandemic (e.g. social distancing).  This format is felt to be most appropriate for this patient at this time.  All issues noted in this document were discussed and addressed.  No physical exam was performed (except for noted visual exam findings with Video Visits).  I discussed the limitations of evaluation and management by telemedicine and the availability of in person appointments. The patient expressed understanding and agreed to proceed.  I connected with@ on 08/31/18 at 10:30 AM EDT by a video enabled telemedicine application and verified that I am speaking with the correct person using two identifiers.   Patient Location: home [address] [place] [address]   Provider location:   [facility]  Chief Complaint  Patient presents with  . Follow-up    3 M o F/U HTN / TSH labs completed    HPI  Ronnie Snyder is a 78 y.o. male who presents via audio/video conferencing for a telehealth visit today.  He is following up today for hypothyroidism and HTN.   -HTN:  Reports compliance with current medications.  He is unsure of what BP has been as he has not checked at home.  He does have an automated cuff that he can use, he just needs to find it.  He denies symptoms of chest pain, shortness of breath, palpitations, headache or vision changes.   -Hypothyroidism:  Recent TSH wnl.  He is doing well at this time and denies symptoms of hypo/hyper-thyroidism.    ROS:  A comprehensive ROS was completed and negative except as noted per HPI  Past Medical History:  Diagnosis Date  . Abdominal aortic aneurysm (HCC)    3.5 cm 10/2016 L-spine CT (previously 3.0 cm by U/S 02/25/11)  . Anxiety   . Arthritis    "in my feet" (10/13/2012)  . GERD (gastroesophageal reflux disease)   . H/O hiatal hernia   .  Hepatic steatosis   . High cholesterol    "at one time; it's fine now" (10/13/2012)  . HTN (hypertension)   . Hx of colonic polyps   . Hypothyroidism   . PAD (peripheral artery disease) ([place])   . Skin cancer    "burned them off my arm and such" (10/13/2012)    Past Surgical History:  Procedure Laterality Date  . ABDOMINAL ANGIOGRAM N/A 12/04/2011   Procedure: ABDOMINAL ANGIOGRAM;  Surgeon: Sherren Mocha, MD;  Location: [facility] CATH LAB;  Service: Cardiovascular;  Laterality: N/A;  . ABDOMINAL AORTAGRAM N/A 10/13/2012   Procedure: ABDOMINAL Maxcine Ham;  Surgeon: Serafina Mitchell, MD;  Location: [facility] CATH LAB;  Service: Cardiovascular;  Laterality: N/A;  . ANGIOPLASTY / STENTING FEMORAL Left 10/13/2012  . ESOPHAGOGASTRODUODENOSCOPY    . FEMORAL ENDARTERECTOMY Left 01/23/12   Left Endarterectomy  with bovie patch Angioplasy  . gated spect wall motion stress cardiolite  02/10/2002  . HIP FRACTURE SURGERY Right 1990's  . INGUINAL HERNIA REPAIR Right    "years ago" (10/13/2012)  . LUMBAR LAMINECTOMY/DECOMPRESSION MICRODISCECTOMY N/A 05/23/2017   Procedure: L4-5 DECOMPRESSION;  Surgeon: Marybelle Killings, MD;  Location: [facility];  Service: Orthopedics;  Laterality: N/A;  . NOSE SURGERY    . TONSILLECTOMY     "I was a chld" (10/13/2012)    Family History  Problem Relation Age of Onset  . Hypertension Mother   . Hypertension Father   .  Hypertension Sister   . Hypertension Sister     Social History   Socioeconomic History  . Marital status: Married    Spouse name: Not on file  . Number of children: Not on file  . Years of education: Not on file  . Highest education level: Not on file  Occupational History  . Occupation: retired    Comment: optician  Social Needs  . Financial resource strain: Not on file  . Food insecurity:    Worry: Not on file    Inability: Not on file  . Transportation needs:    Medical: Not on file    Non-medical: Not on file  Tobacco Use  . Smoking status: Light Tobacco  Smoker    Packs/day: 0.25    Years: 57.00    Pack years: 14.25    Types: Cigarettes  . Smokeless tobacco: Never Used  . Tobacco comment: pt states there is no hope for quitting  Substance and Sexual Activity  . Alcohol use: Yes    Alcohol/week: 5.0 standard drinks    Types: 5 Cans of beer per week  . Drug use: No  . Sexual activity: Never  Lifestyle  . Physical activity:    Days per week: Not on file    Minutes per session: Not on file  . Stress: Not on file  Relationships  . Social connections:    Talks on phone: Not on file    Gets together: Not on file    Attends religious service: Not on file    Active member of club or organization: Not on file    Attends meetings of clubs or organizations: Not on file    Relationship status: Not on file  . Intimate partner violence:    Fear of current or ex partner: Not on file    Emotionally abused: Not on file    Physically abused: Not on file    Forced sexual activity: Not on file  Other Topics Concern  . Not on file  Social History Narrative  . Not on file     Current Outpatient Medications:  .  amLODipine (NORVASC) 2.5 MG tablet, Take 1 tablet by mouth daily., Disp: , Rfl:  .  clopidogrel (PLAVIX) 75 MG tablet, Take 1 tablet (75 mg total) by mouth daily., Disp: 30 tablet, Rfl: 6 .  levothyroxine (SYNTHROID, LEVOTHROID) 75 MCG tablet, Take 1 tablet (75 mcg total) by mouth daily before breakfast., Disp: 90 tablet, Rfl: 3 .  losartan (COZAAR) 100 MG tablet, Take 1 tablet (100 mg total) by mouth daily., Disp: 90 tablet, Rfl: 3  EXAM:  VITALS per patient if applicable: Pulse 80   Wt 145 lb (65.8 kg) Comment: pt reports from hm  SpO2 93% Comment: pt reports from hm  BMI 22.05 kg/m   GENERAL: alert, oriented, appears well and in no acute distress  HEENT: atraumatic, conjunttiva clear, no obvious abnormalities on inspection of external nose and ears  NECK: normal movements of the head and neck  LUNGS: on inspection no  signs of respiratory distress, breathing rate appears normal, no obvious gross SOB, gasping or wheezing  CV: no obvious cyanosis  MS: moves all visible extremities without noticeable abnormality  PSYCH/NEURO: pleasant and cooperative, no obvious depression or anxiety, speech and thought processing grossly intact  ASSESSMENT AND PLAN:  Discussed the following assessment and plan:  Other specified hypothyroidism Recent TSH normal, continue levothyroxine at current dose.   Essential hypertension -Stable, continue current medication.  -Encouraged to  monitor occasionally at home.        I discussed the assessment and treatment plan with the patient. The patient was provided an opportunity to ask questions and all were answered. The patient agreed with the plan and demonstrated an understanding of the instructions.   The patient was advised to call back or seek an in-person evaluation if the symptoms worsen or if the condition fails to improve as anticipated.     Luetta Nutting, DO

## 2018-08-31 NOTE — Assessment & Plan Note (Signed)
Recent TSH normal, continue levothyroxine at current dose.

## 2018-10-26 ENCOUNTER — Encounter: Payer: Self-pay | Admitting: Family Medicine

## 2018-10-26 ENCOUNTER — Ambulatory Visit (INDEPENDENT_AMBULATORY_CARE_PROVIDER_SITE_OTHER): Payer: Medicare Other | Admitting: Family Medicine

## 2018-10-26 VITALS — BP 148/78 | HR 83 | Temp 97.8°F | Resp 18 | Ht 68.0 in | Wt 144.2 lb

## 2018-10-26 DIAGNOSIS — R3911 Hesitancy of micturition: Secondary | ICD-10-CM | POA: Insufficient documentation

## 2018-10-26 DIAGNOSIS — R61 Generalized hyperhidrosis: Secondary | ICD-10-CM

## 2018-10-26 DIAGNOSIS — R339 Retention of urine, unspecified: Secondary | ICD-10-CM

## 2018-10-26 LAB — PSA: PSA: 0.24 ng/mL (ref 0.10–4.00)

## 2018-10-26 LAB — POCT URINALYSIS DIPSTICK
Bilirubin, UA: NEGATIVE
Blood, UA: NEGATIVE
Glucose, UA: NEGATIVE
Ketones, UA: NEGATIVE
Leukocytes, UA: NEGATIVE
Nitrite, UA: NEGATIVE
Protein, UA: POSITIVE — AB
Spec Grav, UA: 1.01 (ref 1.010–1.025)
Urobilinogen, UA: 0.2 E.U./dL
pH, UA: 7.5 (ref 5.0–8.0)

## 2018-10-26 LAB — CBC WITH DIFFERENTIAL/PLATELET
Basophils Absolute: 0.1 10*3/uL (ref 0.0–0.1)
Basophils Relative: 1.2 % (ref 0.0–3.0)
Eosinophils Absolute: 0.1 10*3/uL (ref 0.0–0.7)
Eosinophils Relative: 2.2 % (ref 0.0–5.0)
HCT: 35.6 % — ABNORMAL LOW (ref 39.0–52.0)
Hemoglobin: 11.7 g/dL — ABNORMAL LOW (ref 13.0–17.0)
Lymphocytes Relative: 25.3 % (ref 12.0–46.0)
Lymphs Abs: 1.3 10*3/uL (ref 0.7–4.0)
MCHC: 32.8 g/dL (ref 30.0–36.0)
MCV: 91 fl (ref 78.0–100.0)
Monocytes Absolute: 0.6 10*3/uL (ref 0.1–1.0)
Monocytes Relative: 11.4 % (ref 3.0–12.0)
Neutro Abs: 3.1 10*3/uL (ref 1.4–7.7)
Neutrophils Relative %: 59.9 % (ref 43.0–77.0)
Platelets: 181 10*3/uL (ref 150.0–400.0)
RBC: 3.91 Mil/uL — ABNORMAL LOW (ref 4.22–5.81)
RDW: 15.5 % (ref 11.5–15.5)
WBC: 5.2 10*3/uL (ref 4.0–10.5)

## 2018-10-26 LAB — BASIC METABOLIC PANEL
BUN: 11 mg/dL (ref 6–23)
CO2: 30 mEq/L (ref 19–32)
Calcium: 9.2 mg/dL (ref 8.4–10.5)
Chloride: 97 mEq/L (ref 96–112)
Creatinine, Ser: 0.59 mg/dL (ref 0.40–1.50)
GFR: 133.21 mL/min (ref >60.00)
Glucose, Bld: 106 mg/dL — ABNORMAL HIGH (ref 70–99)
Potassium: 4.9 mEq/L (ref 3.5–5.1)
Sodium: 134 mEq/L — ABNORMAL LOW (ref 135–145)

## 2018-10-26 NOTE — Assessment & Plan Note (Signed)
Check cbc 

## 2018-10-26 NOTE — Assessment & Plan Note (Addendum)
-   Lab Orders     CBC w/Diff     PSA     Basic Metabolic Panel (BMET)     POCT Urinalysis Dipstick

## 2018-10-26 NOTE — Progress Notes (Signed)
Ronnie Snyder - 78 y.o. male MRN [id number]  Date of birth: 1940-10-08  Subjective Chief Complaint  Patient presents with  . Follow-up    felling tired, questions about kidneys, poor appetite, night sewats, stream stops/starts durning urination     HPI Ronnie Snyder is a 78 y.o. male with history of hypothyroidism, PAD, HTN, and HLD here today with complaint of urinary symptoms including urinary hesitancy and incomplete emptying of the bladder.  He also reports decreased appetite and night sweats.  He denies pain with urination, fever, chills, blood in urine, back pain, nausea or vomiting.  ROS:  A comprehensive ROS was completed and negative except as noted per HPI  Allergies  Allergen Reactions  . Other     UNSPECIFIED REACTION  "ALL PAINS/NARCOTIC MEDS-CANNOT TOLERATE WELL" > per Med History prior to 05/23/17  . Atorvastatin Nausea Only  . Penicillins Rash    Has patient had a PCN reaction causing immediate rash, facial/tongue/throat swelling, SOB or lightheadedness with hypotension: Unknown Has patient had a PCN reaction causing severe rash involving mucus membranes or skin necrosis: No Has patient had a PCN reaction that required hospitalization: No Has patient had a PCN reaction occurring within the last 10 years: No If all of the above answers are "NO", then may proceed with Cephalosporin use.   . Pioglitazone Nausea Only  . Vicodin [Hydrocodone-Acetaminophen] Nausea Only    Past Medical History:  Diagnosis Date  . Abdominal aortic aneurysm (HCC)    3.5 cm 10/2016 L-spine CT (previously 3.0 cm by U/S 02/25/11)  . Anxiety   . Arthritis    "in my feet" (10/13/2012)  . GERD (gastroesophageal reflux disease)   . H/O hiatal hernia   . Hepatic steatosis   . High cholesterol    "at one time; it's fine now" (10/13/2012)  . HTN (hypertension)   . Hx of colonic polyps   . Hypothyroidism   . PAD (peripheral artery disease) ([place])   . Skin cancer    "burned them off  my arm and such" (10/13/2012)    Past Surgical History:  Procedure Laterality Date  . ABDOMINAL ANGIOGRAM N/A 12/04/2011   Procedure: ABDOMINAL ANGIOGRAM;  Surgeon: Sherren Mocha, MD;  Location: [facility] CATH LAB;  Service: Cardiovascular;  Laterality: N/A;  . ABDOMINAL AORTAGRAM N/A 10/13/2012   Procedure: ABDOMINAL Maxcine Ham;  Surgeon: Serafina Mitchell, MD;  Location: [facility] CATH LAB;  Service: Cardiovascular;  Laterality: N/A;  . ANGIOPLASTY / STENTING FEMORAL Left 10/13/2012  . ESOPHAGOGASTRODUODENOSCOPY    . FEMORAL ENDARTERECTOMY Left 01/23/12   Left Endarterectomy  with bovie patch Angioplasy  . gated spect wall motion stress cardiolite  02/10/2002  . HIP FRACTURE SURGERY Right 1990's  . INGUINAL HERNIA REPAIR Right    "years ago" (10/13/2012)  . LUMBAR LAMINECTOMY/DECOMPRESSION MICRODISCECTOMY N/A 05/23/2017   Procedure: L4-5 DECOMPRESSION;  Surgeon: Marybelle Killings, MD;  Location: [facility];  Service: Orthopedics;  Laterality: N/A;  . NOSE SURGERY    . TONSILLECTOMY     "I was a chld" (10/13/2012)    Social History   Socioeconomic History  . Marital status: Married    Spouse name: Not on file  . Number of children: Not on file  . Years of education: Not on file  . Highest education level: Not on file  Occupational History  . Occupation: retired    Comment: optician  Social Needs  . Financial resource strain: Not on file  . Food insecurity:    Worry: Not  on file    Inability: Not on file  . Transportation needs:    Medical: Not on file    Non-medical: Not on file  Tobacco Use  . Smoking status: Light Tobacco Smoker    Packs/day: 0.25    Years: 57.00    Pack years: 14.25    Types: Cigarettes  . Smokeless tobacco: Never Used  . Tobacco comment: pt states there is no hope for quitting  Substance and Sexual Activity  . Alcohol use: Yes    Alcohol/week: 5.0 standard drinks    Types: 5 Cans of beer per week  . Drug use: No  . Sexual activity: Never  Lifestyle  . Physical activity:     Days per week: Not on file    Minutes per session: Not on file  . Stress: Not on file  Relationships  . Social connections:    Talks on phone: Not on file    Gets together: Not on file    Attends religious service: Not on file    Active member of club or organization: Not on file    Attends meetings of clubs or organizations: Not on file    Relationship status: Not on file  Other Topics Concern  . Not on file  Social History Narrative  . Not on file    Family History  Problem Relation Age of Onset  . Hypertension Mother   . Hypertension Father   . Hypertension Sister   . Hypertension Sister     Health Maintenance  Topic Date Due  . INFLUENZA VACCINE  12/19/2018  . TETANUS/TDAP  05/08/2027  . PNA vac Low Risk Adult  Completed    ----------------------------------------------------------------------------------------------------------------------------------------------------------------------------------------------------------------- Physical Exam BP (!) 148/78   Pulse 83   Temp 97.8 F (36.6 C) (Oral)   Resp 18   Ht 5\' 8"  (1.727 m)   Wt 144 lb 3.2 oz (65.4 kg)   SpO2 93%   BMI 21.93 kg/m   Physical Exam Constitutional:      Appearance: Normal appearance.  HENT:     Head: Normocephalic and atraumatic.     Mouth/Throat:     Mouth: Mucous membranes are moist.  Eyes:     General: No scleral icterus. Cardiovascular:     Rate and Rhythm: Normal rate and regular rhythm.  Pulmonary:     Effort: Pulmonary effort is normal.  Skin:    General: Skin is warm and dry.     Findings: No rash.  Neurological:     General: No focal deficit present.     Mental Status: He is alert.  Psychiatric:        Mood and Affect: Mood normal.        Behavior: Behavior normal.      ------------------------------------------------------------------------------------------------------------------------------------------------------------------------------------------------------------------- Assessment and Plan  Urinary hesitancy - Lab Orders     CBC w/Diff     PSA     Basic Metabolic Panel (BMET)     POCT Urinalysis Dipstick   Night sweats -Check cbc

## 2018-10-26 NOTE — Patient Instructions (Signed)
We'll be in touch with lab results.  

## 2018-10-29 NOTE — Progress Notes (Signed)
PSA levels are normal. UA with mild protein but no other signs of infection.  I would recommend trial of flomax for his symptoms.  If not improving with this have him see urology.

## 2018-10-30 ENCOUNTER — Other Ambulatory Visit: Payer: Self-pay | Admitting: Family Medicine

## 2018-10-30 ENCOUNTER — Encounter: Payer: Self-pay | Admitting: Family Medicine

## 2018-10-30 ENCOUNTER — Telehealth: Payer: Self-pay

## 2018-10-30 MED ORDER — TAMSULOSIN HCL 0.4 MG PO CAPS: 0.4000 mg | ORAL_CAPSULE | Freq: Every day | ORAL | 3 refills | Status: DC

## 2018-10-30 NOTE — Telephone Encounter (Signed)
Completed.

## 2018-10-30 NOTE — Telephone Encounter (Signed)
Copied from [place] [phone] [id number]. Topic: Quick Communication - Lab Results (Clinic Use ONLY) >> Oct 30, 2018 10:41 AM Lennox Solders wrote: Pt view his lab results on mychart. Pt is calling requesting iron results. Pt said he was anemia before

## 2018-10-30 NOTE — Telephone Encounter (Signed)
Called Pt back. Gave recommendations from Dr. Zigmund Daniel. Pt is aware. Task completed

## 2018-10-30 NOTE — Telephone Encounter (Signed)
I would recommend OTC iron supplement for now.  Anemia is mild and improved from previous check.

## 2018-11-05 ENCOUNTER — Other Ambulatory Visit: Payer: Self-pay | Admitting: Endocrinology

## 2018-11-05 NOTE — Telephone Encounter (Signed)
Please forward refill request to pt's new primary care provider.  

## 2018-11-10 ENCOUNTER — Encounter: Payer: Self-pay | Admitting: Family Medicine

## 2018-11-10 ENCOUNTER — Other Ambulatory Visit: Payer: Self-pay

## 2018-11-10 DIAGNOSIS — I10 Essential (primary) hypertension: Secondary | ICD-10-CM

## 2018-11-10 MED ORDER — AMLODIPINE BESYLATE 2.5 MG PO TABS: 2.5000 mg | ORAL_TABLET | Freq: Every day | ORAL | 0 refills | Status: DC

## 2018-11-10 NOTE — Telephone Encounter (Signed)
Pt sent MyChart message for Rx refill request .

## 2018-11-16 ENCOUNTER — Telehealth: Payer: Self-pay | Admitting: Family Medicine

## 2018-11-16 DIAGNOSIS — R3911 Hesitancy of micturition: Secondary | ICD-10-CM

## 2018-11-16 NOTE — Telephone Encounter (Signed)
Urology referral sent.

## 2018-11-16 NOTE — Telephone Encounter (Signed)
Copied from [place] [id number]. Topic: General - Inquiry >> Nov 16, 2018  9:18 AM Virl Axe D wrote: Reason for CRM: Pt stated that the new medication for his prostate has not been working and he would like to have [facility] call him regarding the next step. Please advise. CB#[phone]

## 2018-11-16 NOTE — Telephone Encounter (Signed)
Recommend referral to urology for urinary hesitancy, please enter referral if he agrees.

## 2018-11-29 ENCOUNTER — Other Ambulatory Visit: Payer: Self-pay | Admitting: Endocrinology

## 2018-11-29 NOTE — Telephone Encounter (Signed)
Please forward refill request to pt's new primary care provider.  

## 2018-11-30 NOTE — Telephone Encounter (Signed)
At Dr. Ellison's request, I am forwarding you this refill request. Please review and approve if appropriate 

## 2018-12-17 ENCOUNTER — Ambulatory Visit (HOSPITAL_COMMUNITY)
Admission: EM | Admit: 2018-12-17 | Discharge: 2018-12-17 | Disposition: A | Discharge status: Home / Self Care | Payer: Medicare Other | Attending: Family Medicine | Admitting: Family Medicine

## 2018-12-17 ENCOUNTER — Ambulatory Visit (INDEPENDENT_AMBULATORY_CARE_PROVIDER_SITE_OTHER): Payer: Medicare Other

## 2018-12-17 ENCOUNTER — Encounter (HOSPITAL_COMMUNITY): Payer: Self-pay | Admitting: Emergency Medicine

## 2018-12-17 ENCOUNTER — Other Ambulatory Visit: Payer: Self-pay

## 2018-12-17 DIAGNOSIS — R0789 Other chest pain: Secondary | ICD-10-CM

## 2018-12-17 DIAGNOSIS — I10 Essential (primary) hypertension: Secondary | ICD-10-CM

## 2018-12-17 DIAGNOSIS — R0781 Pleurodynia: Secondary | ICD-10-CM

## 2018-12-17 LAB — POCT URINALYSIS DIP (DEVICE)
Bilirubin Urine: NEGATIVE
Glucose, UA: NEGATIVE mg/dL
Hgb urine dipstick: NEGATIVE
Ketones, ur: NEGATIVE mg/dL
Leukocytes,Ua: NEGATIVE
Nitrite: NEGATIVE
Protein, ur: NEGATIVE mg/dL
Specific Gravity, Urine: 1.02 (ref 1.005–1.030)
Urobilinogen, UA: 0.2 mg/dL (ref 0.0–1.0)
pH: 7 (ref 5.0–8.0)

## 2018-12-17 MED ORDER — DICLOFENAC SODIUM 1 % TD GEL: 2.0000 g | Freq: Four times a day (QID) | TRANSDERMAL | 1 refills | Status: DC

## 2018-12-17 NOTE — Discharge Instructions (Signed)
Your x-ray did not show anything worrisome.  Most like this is musculoskeletal pain.  I am prescribing some Voltaren gel that she can place 4 times a day on the area as needed Follow up as needed for continued or worsening symptoms

## 2018-12-17 NOTE — ED Triage Notes (Signed)
Pt reports a pain to his right lower flank rib cage.  He states it only hurts if he is lying on that side or if you touch it.  He states he has had the pain for over a week.

## 2018-12-18 NOTE — ED Provider Notes (Addendum)
[place]    CSN: [id number] Arrival date & time: 12/17/18  0804     History   Chief Complaint Chief Complaint  Patient presents with  . Flank Pain    right side    HPI Ronnie Snyder is a 78 y.o. male.   Patient is a 78 year old male with past medical history of AAA, anxiety, arthritis, GERD, high cholesterol, hypertension, hypothyroidism, PAD.  He presents today with approximately 1 week of right flank/rib discomfort.  Reporting the pain is worse with movements and lying on that side.  He has been using over-the-counter pain cream with some relief.  Denies any chest pain or shortness of breath.  Denies any associated cough, chest congestion.  No fevers.  No injuries or heavy lifting.  No dysuria, hematuria or urinary frequency.  No nausea, vomiting or diarrhea.  ROS per HPI      Past Medical History:  Diagnosis Date  . Abdominal aortic aneurysm (HCC)    3.5 cm 10/2016 L-spine CT (previously 3.0 cm by U/S 02/25/11)  . Anxiety   . Arthritis    "in my feet" (10/13/2012)  . GERD (gastroesophageal reflux disease)   . H/O hiatal hernia   . Hepatic steatosis   . High cholesterol    "at one time; it's fine now" (10/13/2012)  . HTN (hypertension)   . Hx of colonic polyps   . Hypothyroidism   . PAD (peripheral artery disease) ([place])   . Skin cancer    "burned them off my arm and such" (10/13/2012)    Patient Active Problem List   Diagnosis Date Noted  . Urinary hesitancy 10/26/2018  . Night sweats 10/26/2018  . Other specified hypothyroidism 05/29/2018  . Lumbar stenosis 05/23/2017  . Anemia 10/02/2016  . Ecchymosis 09/23/2013  . PAD (peripheral artery disease) ([place]) 08/09/2013  . Aftercare following surgery of the circulatory system, [place] 02/01/2013  . Smoker 08/13/2012  . Other and unspecified alcohol dependence, unspecified drinking behavior 08/13/2012  . Screening for prostate cancer 08/13/2012  . Encounter for long-term (current) use of other  medications 08/13/2012  . Atherosclerosis of native artery of extremity with intermittent claudication ([place]) 08/21/2011  . ECZEMA 01/24/2010  . COUGH 01/24/2009  . AAA (abdominal aortic aneurysm) ([place]) 12/06/2008  . HYPERCHOLESTEROLEMIA 01/21/2008  . ERECTILE DYSFUNCTION, ORGANIC 01/21/2008  . Back pain 01/21/2008  . 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 Anxiety state 03/07/2007  . ERECTILE DYSFUNCTION 03/07/2007  . DEPRESSION 03/07/2007  . Essential hypertension 03/07/2007  . CAROTID ARTERY STENOSIS 03/07/2007  . HIP PAIN, RIGHT 03/07/2007  . COLONIC POLYPS, HX OF 03/07/2007  . ADENOIDECTOMY, HX OF 03/07/2007    Past Surgical History:  Procedure Laterality Date  . ABDOMINAL ANGIOGRAM N/A 12/04/2011   Procedure: ABDOMINAL ANGIOGRAM;  Surgeon: Sherren Mocha, MD;  Location[facility] C[facility] LAB;  Service: Cardiovascular;  Laterality: N/A;  . ABDOMINAL AORTAGRAM N/A 10/13/2012   Procedure: ABDOMINAL Maxcine Ham;  Surgeon: Serafina Mitchell, MD;  Location: [facility] CATH LAB;  Service: Cardiovascular;  Laterality: N/A;  . ANGIOPLASTY / STENTING FEMORAL Left 10/13/2012  . ESOPHAGOGASTRODUODENOSCOPY    . FEMORAL ENDARTERECTOMY Left 01/23/12   Left Endarterectomy  with bovie patch Angioplasy  . gated spect wall motion stress cardiolite  02/10/2002  . HIP FRACTURE SURGERY Right 1990's  . INGUINAL HERNIA REPAIR Right    "years ago" (10/13/2012)  . LUMBAR LAMINECTOMY/DECOMPRESSION MICRODISCECTOMY N/A 05/23/2017   Procedure: L4-5 DECOMPRESSION;  Surgeon: Marybelle Killings, MD;  Location: [facility];  Service: Orthopedics;  Laterality: N/A;  . NOSE SURGERY    . TONSILLECTOMY     "I was a chld" (10/13/2012)       Home Medications    Prior to Admission medications   Medication Sig Start Date End Date Taking? Authorizing Provider  ALPRAZolam (XANAX) 0.25 MG tablet TAKE 1 TABLET(0.25 MG) BY MOUTH TWICE DAILY AS NEEDED FOR ANXIETY 12/02/18  Yes Luetta Nutting, DO  amLODipine (NORVASC) 2.5 MG tablet Take 1 tablet (2.5 mg total) by  mouth daily. 11/10/18  Yes Luetta Nutting, DO  clopidogrel (PLAVIX) 75 MG tablet Take 1 tablet (75 mg total) by mouth daily. 05/29/18  Yes Luetta Nutting, DO  levothyroxine (SYNTHROID, LEVOTHROID) 75 MCG tablet Take 1 tablet (75 mcg total) by mouth daily before breakfast. 05/29/18  Yes Luetta Nutting, DO  losartan (COZAAR) 100 MG tablet Take 1 tablet (100 mg total) by mouth daily. 05/29/18  Yes Luetta Nutting, DO  tamsulosin (FLOMAX) 0.4 MG CAPS capsule Take 1 capsule (0.4 mg total) by mouth daily. 10/30/18  Yes Luetta Nutting, DO  diclofenac sodium (VOLTAREN) 1 % GEL Apply 2 g topically 4 (four) times daily. 12/17/18   Orvan July, NP    Family History Family History  Problem Relation Age of Onset  . Hypertension Mother   . Hypertension Father   . Hypertension Sister   . Hypertension Sister     Social History Social History   Tobacco Use  . Smoking status: Light Tobacco Smoker    Packs/day: 0.25    Years: 57.00    Pack years: 14.25    Types: Cigarettes  . Smokeless tobacco: Never Used  . Tobacco comment: pt states there is no hope for quitting  Substance Use Topics  . Alcohol use: Yes    Alcohol/week: 5.0 standard drinks    Types: 5 Cans of beer per week  . Drug use: No     Allergies   Other, Atorvastatin, Penicillins, Pioglitazone, and Vicodin [hydrocodone-acetaminophen]   Review of Systems Review of Systems   Physical Exam Triage Vital Signs ED Triage Vitals  Enc Vitals Group     BP 12/17/18 0819 (!) 174/98     Pulse Rate 12/17/18 0819 77     Resp 12/17/18 0819 16     Temp 12/17/18 0819 98.4 F (36.9 C)     Temp Source 12/17/18 0819 Temporal     SpO2 12/17/18 0819 100 %     Weight --      Height --      Head Circumference --      Peak Flow --      Pain Score 12/17/18 0824 3     Pain Loc --      Pain Edu? --      Excl. in Page? --    No data found.  Updated Vital Signs BP (!) 174/98 (BP Location: Left Arm)   Pulse 77   Temp 98.4 F (36.9 C) (Temporal)    Resp 16   SpO2 100%   Visual Acuity Right Eye Distance:   Left Eye Distance:   Bilateral Distance:    Right Eye Near:   Left Eye Near:    Bilateral Near:     Physical Exam Vitals signs and nursing note reviewed.  Constitutional:      General: He is not in acute distress.    Appearance: Normal appearance. He is not ill-appearing, toxic-appearing or diaphoretic.  HENT:     Head: Normocephalic and atraumatic.  Nose: Nose normal.  Eyes:     Conjunctiva/sclera: Conjunctivae normal.  Neck:     Musculoskeletal: Normal range of motion.  Cardiovascular:     Rate and Rhythm: Normal rate and regular rhythm.     Pulses: Normal pulses.     Heart sounds: Normal heart sounds.  Pulmonary:     Effort: Pulmonary effort is normal.     Breath sounds: Normal breath sounds.  Musculoskeletal: Normal range of motion.     Comments: Tender to palpation of the right lateral rib area No bruising, swelling or deformities.  No rashes.  Skin:    General: Skin is warm and dry.     Findings: No rash.  Neurological:     Mental Status: He is alert.  Psychiatric:        Mood and Affect: Mood normal.      UC Treatments / Results  Labs (all labs ordered are listed, but only abnormal results are displayed) Labs Reviewed  POCT URINALYSIS DIP (DEVICE)    EKG   Radiology Dg Ribs Unilateral W/chest Right  Result Date: 12/17/2018 CLINICAL DATA:  Right lower rib pain for 1.5 weeks EXAM: RIGHT RIBS AND CHEST - 3+ VIEW COMPARISON:  07/21/2017 FINDINGS: No fracture or other bone lesions are seen involving the ribs. There is no evidence of pneumothorax or pleural effusion. Both lungs are clear. The lungs are hyperinflated likely secondary to COPD. Heart size and mediastinal contours are within normal limits. IMPRESSION: No acute osseous abnormality of the right ribs. Electronically Signed   By: Kathreen Devoid   On: 12/17/2018 09:37    Procedures Procedures (including critical care time)   Medications Ordered in UC Medications - No data to display  Initial Impression / Assessment and Plan / UC Course  I have reviewed the triage vital signs and the nursing notes.  Pertinent labs & imaging results that were available during my care of the patient were reviewed by me and considered in my medical decision making (see chart for details).     Chest x-ray without any abnormality. Most likely musculoskeletal pain. Will treat with Voltaren gel 4 times a day as needed Recommended stretching, heat Follow up as needed for continued or worsening symptoms  Final Clinical Impressions(s) / UC Diagnoses   Final diagnoses:  Rib pain on right side     Discharge Instructions     Your x-ray did not show anything worrisome.  Most like this is musculoskeletal pain.  I am prescribing some Voltaren gel that she can place 4 times a day on the area as needed Follow up as needed for continued or worsening symptoms     ED Prescriptions    Medication Sig Dispense Auth. Provider   diclofenac sodium (VOLTAREN) 1 % GEL Apply 2 g topically 4 (four) times daily. 100 g Loura Halt A, NP     Controlled Substance Prescriptions [place] Controlled Substance Registry consulted? Not Applicable   Orvan July, NP 12/18/18 1018    Loura Halt A, NP 12/18/18 1020

## 2018-12-28 DIAGNOSIS — R351 Nocturia: Secondary | ICD-10-CM | POA: Diagnosis not present

## 2018-12-28 DIAGNOSIS — N401 Enlarged prostate with lower urinary tract symptoms: Secondary | ICD-10-CM | POA: Diagnosis not present

## 2018-12-28 DIAGNOSIS — N5201 Erectile dysfunction due to arterial insufficiency: Secondary | ICD-10-CM | POA: Diagnosis not present

## 2019-01-04 ENCOUNTER — Other Ambulatory Visit: Payer: Self-pay | Admitting: Family Medicine

## 2019-01-14 DIAGNOSIS — D229 Melanocytic nevi, unspecified: Secondary | ICD-10-CM | POA: Diagnosis not present

## 2019-01-14 DIAGNOSIS — D485 Neoplasm of uncertain behavior of skin: Secondary | ICD-10-CM | POA: Diagnosis not present

## 2019-01-14 DIAGNOSIS — L814 Other melanin hyperpigmentation: Secondary | ICD-10-CM | POA: Diagnosis not present

## 2019-01-14 DIAGNOSIS — C4442 Squamous cell carcinoma of skin of scalp and neck: Secondary | ICD-10-CM | POA: Diagnosis not present

## 2019-01-14 DIAGNOSIS — L570 Actinic keratosis: Secondary | ICD-10-CM | POA: Diagnosis not present

## 2019-01-14 DIAGNOSIS — L821 Other seborrheic keratosis: Secondary | ICD-10-CM | POA: Diagnosis not present

## 2019-01-14 DIAGNOSIS — L819 Disorder of pigmentation, unspecified: Secondary | ICD-10-CM | POA: Diagnosis not present

## 2019-01-27 DIAGNOSIS — R351 Nocturia: Secondary | ICD-10-CM | POA: Diagnosis not present

## 2019-01-27 DIAGNOSIS — N5201 Erectile dysfunction due to arterial insufficiency: Secondary | ICD-10-CM | POA: Diagnosis not present

## 2019-01-27 DIAGNOSIS — N401 Enlarged prostate with lower urinary tract symptoms: Secondary | ICD-10-CM | POA: Diagnosis not present

## 2019-02-08 ENCOUNTER — Other Ambulatory Visit: Payer: Self-pay

## 2019-02-08 DIAGNOSIS — I10 Essential (primary) hypertension: Secondary | ICD-10-CM

## 2019-02-08 MED ORDER — AMLODIPINE BESYLATE 2.5 MG PO TABS: 2.5000 mg | ORAL_TABLET | Freq: Every day | ORAL | 0 refills | Status: DC

## 2019-02-11 DIAGNOSIS — C4442 Squamous cell carcinoma of skin of scalp and neck: Secondary | ICD-10-CM | POA: Diagnosis not present

## 2019-03-01 ENCOUNTER — Other Ambulatory Visit: Payer: Self-pay

## 2019-03-02 ENCOUNTER — Ambulatory Visit (INDEPENDENT_AMBULATORY_CARE_PROVIDER_SITE_OTHER): Payer: Medicare Other | Admitting: Behavioral Health

## 2019-03-02 ENCOUNTER — Other Ambulatory Visit: Payer: Self-pay

## 2019-03-02 ENCOUNTER — Encounter: Payer: Self-pay | Admitting: Family Medicine

## 2019-03-02 DIAGNOSIS — Z23 Encounter for immunization: Secondary | ICD-10-CM

## 2019-03-02 NOTE — Progress Notes (Signed)
Patient presents in clinic today for Influenza vaccination. IM injection was given in the left deltoid. Patient tolerated the injection well. No signs or symptoms of a reaction were noted prior to patient leaving the nurse visit. 

## 2019-04-30 ENCOUNTER — Telehealth: Payer: Self-pay

## 2019-04-30 NOTE — Telephone Encounter (Signed)
Patient would like a referral to vein specialist.

## 2019-04-30 NOTE — Telephone Encounter (Signed)
Copied from [place] [id number]. Topic: Referral - Request for Referral >> Apr 30, 2019  3:37 PM Lennox Solders wrote: Has patient seen PCP for this complaint yes Reason for referral: varicose veins pt had a stent put in left leg. Dr Glynda Jaeger on [address]. Phone [phone]. Pt has medicare/mutual of [place]

## 2019-04-30 NOTE — Telephone Encounter (Signed)
Lets schedule for  VV to discuss initially.

## 2019-04-30 NOTE — Telephone Encounter (Signed)
What exactly is he requesting?  It looks like phone note is incomplete

## 2019-04-30 NOTE — Telephone Encounter (Signed)
VV scheduled 12/14.

## 2019-05-03 ENCOUNTER — Encounter: Payer: Self-pay | Admitting: Family Medicine

## 2019-05-03 ENCOUNTER — Telehealth (INDEPENDENT_AMBULATORY_CARE_PROVIDER_SITE_OTHER): Payer: Medicare Other | Admitting: Family Medicine

## 2019-05-03 VITALS — Ht 68.0 in

## 2019-05-03 DIAGNOSIS — I714 Abdominal aortic aneurysm, without rupture, unspecified: Secondary | ICD-10-CM

## 2019-05-03 DIAGNOSIS — I739 Peripheral vascular disease, unspecified: Secondary | ICD-10-CM

## 2019-05-03 NOTE — Progress Notes (Signed)
Ronnie Snyder - 78 y.o. male MRN [id number]  Date of birth: 01/24/1941   This visit type was conducted due to national recommendations for restrictions regarding the COVID-19 Pandemic (e.g. social distancing).  This format is felt to be most appropriate for this patient at this time.  All issues noted in this document were discussed and addressed.  No physical exam was performed (except for noted visual exam findings with Video Visits).  I discussed the limitations of evaluation and management by telemedicine and the availability of in person appointments. The patient expressed understanding and agreed to proceed.  I connected with@ on 05/03/19 at  9:20 AM EST by a video enabled telemedicine application and verified that I am speaking with the correct person using two identifiers.  Interactive audio and video telecommunications were attempted between this provider and patient, however failed, due to patient having technical difficulties OR patient did not have access to video capability.  We continued and completed visit with audio only.    Present at visit: Luetta Nutting, DO Caprice Renshaw [place]   Patient Location: Home [address] [place] [address]   Provider location:   [place]  Chief Complaint  Patient presents with  . Referral    pt would like referral to vein doctor     HPI  Ronnie Snyder is a 78 y.o. male who presents via audio/video conferencing for a telehealth visit today.  He has history of PAD s/p stent to femoral artery in in 2014.  He reports increased claudication symptoms and leg heaviness with walking.  Pain located in calves.  He denies leg swelling, discoloration of feet/toes, or pain at rest.  He tried calling his vascular surgeon but was told since it had been two years since his last visit he needed a new referral.    ROS:  A comprehensive ROS was completed and negative except as noted per HPI  Past Medical History:    Diagnosis Date  . Abdominal aortic aneurysm (HCC)    3.5 cm 10/2016 L-spine CT (previously 3.0 cm by U/S 02/25/11)  . Anxiety   . Arthritis    "in my feet" (10/13/2012)  . GERD (gastroesophageal reflux disease)   . H/O hiatal hernia   . Hepatic steatosis   . High cholesterol    "at one time; it's fine now" (10/13/2012)  . HTN (hypertension)   . Hx of colonic polyps   . Hypothyroidism   . PAD (peripheral artery disease) ([place])   . Skin cancer    "burned them off my arm and such" (10/13/2012)    Past Surgical History:  Procedure Laterality Date  . ABDOMINAL ANGIOGRAM N/A 12/04/2011   Procedure: ABDOMINAL ANGIOGRAM;  Surgeon: Sherren Mocha, MD;  Location: [facility] CATH LAB;  Service: Cardiovascular;  Laterality: N/A;  . ABDOMINAL AORTAGRAM N/A 10/13/2012   Procedure: ABDOMINAL Maxcine Ham;  Surgeon: Serafina Mitchell, MD;  Location[facility] CATH LAB;  Service: Cardiovascular;  Laterality: N/A;  . ANGIOPLASTY / STENTING FEMORAL Left 10/13/2012  . ESOPHAGOGASTRODUODENOSCOPY    . FEMORAL ENDARTERECTOMY Left 01/23/12   Left Endarterectomy  with bovie patch Angioplasy  . gated spect wall motion stress cardiolite  02/10/2002  . HIP FRACTURE SURGERY Right 1990's  . INGUINAL HERNIA REPAIR Right    "years ago" (10/13/2012)  . LUMBAR LAMINECTOMY/DECOMPRESSION MICRODISCECTOMY N/A 05/23/2017   Procedure: L4-5 DECOMPRESSION;  Surgeon: Marybelle Killings, MD;  Location: [facility];  Service: Orthopedics;  Laterality: N/A;  . NOSE SURGERY    .  TONSILLECTOMY     "I was a chld" (10/13/2012)    Family History  Problem Relation Age of Onset  . Hypertension Mother   . Hypertension Father   . Hypertension Sister   . Hypertension Sister     Social History   Socioeconomic History  . Marital status: Married    Spouse name: Not on file  . Number of children: Not on file  . Years of education: Not on file  . Highest education level: Not on file  Occupational History  . Occupation: retired    Comment: optician  Tobacco Use   . Smoking status: Light Tobacco Smoker    Packs/day: 0.25    Years: 57.00    Pack years: 14.25    Types: Cigarettes  . Smokeless tobacco: Never Used  . Tobacco comment: pt states there is no hope for quitting  Substance and Sexual Activity  . Alcohol use: Yes    Alcohol/week: 5.0 standard drinks    Types: 5 Cans of beer per week  . Drug use: No  . Sexual activity: Never  Other Topics Concern  . Not on file  Social History Narrative  . Not on file   Social Determinants of Health   Financial Resource Strain:   . Difficulty of Paying Living Expenses: Not on file  Food Insecurity:   . Worried About Charity fundraiser in the Last Year: Not on file  . Ran Out of Food in the Last Year: Not on file  Transportation Needs:   . Lack of Transportation (Medical): Not on file  . Lack of Transportation (Non-Medical): Not on file  Physical Activity:   . Days of Exercise per Week: Not on file  . Minutes of Exercise per Session: Not on file  Stress:   . Feeling of Stress : Not on file  Social Connections:   . Frequency of Communication with Friends and Family: Not on file  . Frequency of Social Gatherings with Friends and Family: Not on file  . Attends Religious Services: Not on file  . Active Member of Clubs or Organizations: Not on file  . Attends Archivist Meetings: Not on file  . Marital Status: Not on file  Intimate Partner Violence:   . Fear of Current or Ex-Partner: Not on file  . Emotionally Abused: Not on file  . Physically Abused: Not on file  . Sexually Abused: Not on file     Current Outpatient Medications:  .  ALPRAZolam (XANAX) 0.25 MG tablet, TAKE 1 TABLET(0.25 MG) BY MOUTH TWICE DAILY AS NEEDED FOR ANXIETY, Disp: 30 tablet, Rfl: 0 .  amLODipine (NORVASC) 2.5 MG tablet, Take 1 tablet (2.5 mg total) by mouth daily., Disp: 90 tablet, Rfl: 0 .  clopidogrel (PLAVIX) 75 MG tablet, TAKE 1 TABLET(75 MG) BY MOUTH DAILY, Disp: 30 tablet, Rfl: 6 .  levothyroxine  (SYNTHROID, LEVOTHROID) 75 MCG tablet, Take 1 tablet (75 mcg total) by mouth daily before breakfast., Disp: 90 tablet, Rfl: 3 .  losartan (COZAAR) 100 MG tablet, Take 1 tablet (100 mg total) by mouth daily., Disp: 90 tablet, Rfl: 3 .  tamsulosin (FLOMAX) 0.4 MG CAPS capsule, Take 1 capsule (0.4 mg total) by mouth daily., Disp: 30 capsule, Rfl: 3 .  diclofenac sodium (VOLTAREN) 1 % GEL, Apply 2 g topically 4 (four) times daily. (Patient not taking: Reported on 05/03/2019), Disp: 100 g, Rfl: 1  EXAM:  VITALS per patient if applicable: Ht 5\' 8"  (1.727 m)   BMI  21.93 kg/m   GENERAL: alert, oriented,in no acute distress  PSYCH/NEURO: pleasant and cooperative, no obvious depression or anxiety, speech and thought processing grossly intact  ASSESSMENT AND PLAN:  Discussed the following assessment and plan:  PAD (peripheral artery disease) Increased claudication symptoms here recently.  Referral entered for follow up with [facility].  Discussed red flag symptoms including discoloration of leg/foot/toes, increased pain or swelling of the leg and that he should seek emergency care for this.  He expresses understanding.    21 minutes of non-face to face time spent with patient   I discussed the assessment and treatment plan with the patient. The patient was provided an opportunity to ask questions and all were answered. The patient agreed with the plan and demonstrated an understanding of the instructions.   The patient was advised to call back or seek an in-person evaluation if the symptoms worsen or if the condition fails to improve as anticipated.    Luetta Nutting, DO

## 2019-05-03 NOTE — Assessment & Plan Note (Addendum)
Increased claudication symptoms here recently.  Referral entered for follow up with [facility].  Discussed red flag symptoms including discoloration of leg/foot/toes, increased pain or swelling of the leg and that he should seek emergency care for this.  He expresses understanding.

## 2019-06-05 ENCOUNTER — Encounter (HOSPITAL_COMMUNITY): Payer: Self-pay | Admitting: Emergency Medicine

## 2019-06-05 ENCOUNTER — Other Ambulatory Visit: Payer: Self-pay

## 2019-06-05 ENCOUNTER — Ambulatory Visit (HOSPITAL_COMMUNITY)
Admission: EM | Admit: 2019-06-05 | Discharge: 2019-06-05 | Disposition: A | Discharge status: Home / Self Care | Payer: Medicare Other | Attending: Family Medicine | Admitting: Family Medicine

## 2019-06-05 DIAGNOSIS — M25522 Pain in left elbow: Secondary | ICD-10-CM

## 2019-06-05 DIAGNOSIS — M79602 Pain in left arm: Secondary | ICD-10-CM | POA: Diagnosis not present

## 2019-06-05 MED ORDER — TRAMADOL HCL 50 MG PO TABS: 50.0000 mg | ORAL_TABLET | Freq: Four times a day (QID) | ORAL | 0 refills | Status: DC | PRN

## 2019-06-05 MED ORDER — METHYLPREDNISOLONE SODIUM SUCC 125 MG IJ SOLR
INTRAMUSCULAR | Status: AC
  Filled 2019-06-05: qty 2

## 2019-06-05 MED ORDER — METHYLPREDNISOLONE SODIUM SUCC 125 MG IJ SOLR
80.0000 mg | Freq: Once | INTRAMUSCULAR | Status: AC
  Administered 2019-06-05: 13:00:00 80 mg via INTRAMUSCULAR

## 2019-06-05 MED ORDER — PREDNISONE 20 MG PO TABS: 40.0000 mg | ORAL_TABLET | Freq: Every day | ORAL | 0 refills | Status: AC

## 2019-06-05 NOTE — ED Triage Notes (Addendum)
Left arm pain, particularly in left elbow, left shoulder hurts when trying to lift left arm above shoulder height.  Denies chest pain.  Patient woke with this pain today.    Patient reports feeling lightheaded intermittently this past week.  This feeling has been limited to this past week.    Patient repeatedly points to left elbow as particularly painful.  Bilateral radial pulses 2 plus.  Hands equally pale and cool to touch  Patient is right handed.    Patient is on blood thinner for stent in left leg.

## 2019-06-05 NOTE — Discharge Instructions (Addendum)
Start prednisone 40 mg once daily for 5 days on tomorrow. Tramadol prescribed for moderate to severe pain.  Continue other arm sling as needed for immobilization.  If symptoms have not significantly improved in 48 hours please follow-up with your primary care provider as you may warrant further evaluation by orthopedic provider.

## 2019-06-05 NOTE — ED Provider Notes (Signed)
[place]    CSN: [id number] Arrival date & time: 06/05/19  1153      History   Chief Complaint Chief Complaint  Patient presents with  . Arm Pain    HPI Ronnie Snyder is a 79 y.o. male.   HPI  Patient presents with left upper arm pain and left elbow pain which occurred upon awakening this morning.  He denies any known injury.  He denies recently lifting any heavy objects. He has reduced range of motion left arm with limited flexion and rotation.  Both motions cause intense pain. He denies numbness or tingling involving the hands or fingers. Denies any previous occurrences of pain involving the left arm. He has not missed or skipped any doses of Plavix. Past Medical History:  Diagnosis Date  . Abdominal aortic aneurysm (HCC)    3.5 cm 10/2016 L-spine CT (previously 3.0 cm by U/S 02/25/11)  . Anxiety   . Arthritis    "in my feet" (10/13/2012)  . GERD (gastroesophageal reflux disease)   . H/O hiatal hernia   . Hepatic steatosis   . High cholesterol    "at one time; it's fine now" (10/13/2012)  . HTN (hypertension)   . Hx of colonic polyps   . Hypothyroidism   . PAD (peripheral artery disease) ([place])   . Skin cancer    "burned them off my arm and such" (10/13/2012)    Patient Active Problem List   Diagnosis Date Noted  . Urinary hesitancy 10/26/2018  . Night sweats 10/26/2018  . Other specified hypothyroidism 05/29/2018  . Lumbar stenosis 05/23/2017  . Anemia 10/02/2016  . Ecchymosis 09/23/2013  . PAD (peripheral artery disease) ([place]) 08/09/2013  . Aftercare following surgery of the circulatory system, [place] 02/01/2013  . Smoker 08/13/2012  . Other and unspecified alcohol dependence, unspecified drinking behavior 08/13/2012  . Screening for prostate cancer 08/13/2012  . Encounter for long-term (current) use of other medications 08/13/2012  . Atherosclerosis of native artery of extremity with intermittent claudication ([place]) 08/21/2011  . ECZEMA  01/24/2010  . COUGH 01/24/2009  . AAA (abdominal aortic aneurysm) ([place]) 12/06/2008  . HYPERCHOLESTEROLEMIA 01/21/2008  . ERECTILE DYSFUNCTION, ORGANIC 01/21/2008  . Back pain 01/21/2008  . 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 Anxiety state 03/07/2007  . ERECTILE DYSFUNCTION 03/07/2007  . DEPRESSION 03/07/2007  . Essential hypertension 03/07/2007  . CAROTID ARTERY STENOSIS 03/07/2007  . HIP PAIN, RIGHT 03/07/2007  . COLONIC POLYPS, HX OF 03/07/2007  . ADENOIDECTOMY, HX OF 03/07/2007    Past Surgical History:  Procedure Laterality Date  . ABDOMINAL ANGIOGRAM N/A 12/04/2011   Procedure: ABDOMINAL ANGIOGRAM;  Surgeon: Sherren Mocha, MD;  Location: [facility] CATH LAB;  Service: Cardiovascular;  Laterality: N/A;  . ABDOMINAL AORTAGRAM N/A 10/13/2012   Procedure: ABDOMINAL Maxcine Ham;  Surgeon: Serafina Mitchell, MD;  Location: [facility] CATH LAB;  Service: Cardiovascular;  Laterality: N/A;  . ANGIOPLASTY / STENTING FEMORAL Left 10/13/2012  . ESOPHAGOGASTRODUODENOSCOPY    . FEMORAL ENDARTERECTOMY Left 01/23/12   Left Endarterectomy  with bovie patch Angioplasy  . gated spect wall motion stress cardiolite  02/10/2002  . HIP FRACTURE SURGERY Right 1990's  . INGUINAL HERNIA REPAIR Right    "years ago" (10/13/2012)  . LUMBAR LAMINECTOMY/DECOMPRESSION MICRODISCECTOMY N/A 05/23/2017   Procedure: L4-5 DECOMPRESSION;  Surgeon: Marybelle Killings, MD;  Location: [place];  Service: Orthopedics;  Laterality: N/A;  . NOSE SURGERY    . TONSILLECTOMY     "I was a chld" (10/13/2012)  Home Medications    Prior to Admission medications   Medication Sig Start Date End Date Taking? Authorizing Provider  ALPRAZolam (XANAX) 0.25 MG tablet TAKE 1 TABLET(0.25 MG) BY MOUTH TWICE DAILY AS NEEDED FOR ANXIETY 12/02/18   Luetta Nutting, DO  amLODipine (NORVASC) 2.5 MG tablet Take 1 tablet (2.5 mg total) by mouth daily. 02/08/19   Luetta Nutting, DO  clopidogrel (PLAVIX) 75 MG tablet TAKE 1 TABLET(75 MG) BY MOUTH DAILY 01/04/19   Luetta Nutting, DO  diclofenac sodium (VOLTAREN) 1 % GEL Apply 2 g topically 4 (four) times daily. Patient not taking: Reported on 05/03/2019 12/17/18   Loura Halt A, NP  levothyroxine (SYNTHROID, LEVOTHROID) 75 MCG tablet Take 1 tablet (75 mcg total) by mouth daily before breakfast. 05/29/18   Luetta Nutting, DO  losartan (COZAAR) 100 MG tablet Take 1 tablet (100 mg total) by mouth daily. 05/29/18   Luetta Nutting, DO  tamsulosin (FLOMAX) 0.4 MG CAPS capsule Take 1 capsule (0.4 mg total) by mouth daily. 10/30/18   Luetta Nutting, DO    Family History Family History  Problem Relation Age of Onset  . Hypertension Mother   . Hypertension Father   . Hypertension Sister   . Hypertension Sister     Social History Social History   Tobacco Use  . Smoking status: Light Tobacco Smoker    Packs/day: 0.25    Years: 57.00    Pack years: 14.25    Types: Cigarettes  . Smokeless tobacco: Never Used  . Tobacco comment: pt states there is no hope for quitting  Substance Use Topics  . Alcohol use: Yes    Alcohol/week: 5.0 standard drinks    Types: 5 Cans of beer per week  . Drug use: No     Allergies   Other, Atorvastatin, Penicillins, Pioglitazone, and Vicodin [hydrocodone-acetaminophen]   Review of Systems Review of Systems Pertinent negatives listed in HPI  Physical Exam Triage Vital Signs ED Triage Vitals  Enc Vitals Group     BP 06/05/19 1222 137/62     Pulse Rate 06/05/19 1222 76     Resp 06/05/19 1222 (!) 26     Temp 06/05/19 1222 98.3 F (36.8 C)     Temp Source 06/05/19 1222 Oral     SpO2 06/05/19 1222 99 %     Weight --      Height --      Head Circumference --      Peak Flow --      Pain Score 06/05/19 1219 3     Pain Loc --      Pain Edu? --      Excl. in [place]? --    No data found.  Updated Vital Signs BP 137/62 (BP Location: Right Arm)   Pulse 76   Temp 98.3 F (36.8 C) (Oral)   Resp (!) 26   SpO2 99%   Visual Acuity Right Eye Distance:   Left Eye Distance:     Bilateral Distance:    Right Eye Near:   Left Eye Near:    Bilateral Near:     Physical Exam Constitutional:      Appearance: Normal appearance.  Cardiovascular:     Rate and Rhythm: Normal rate and regular rhythm.  Pulmonary:     Effort: Pulmonary effort is normal.     Breath sounds: Normal breath sounds and air entry.  Musculoskeletal:     Left shoulder: No tenderness. Decreased range of motion.  Left upper arm: Tenderness and bony tenderness present. No swelling or edema.     Left elbow: No swelling or effusion. Decreased range of motion. Tenderness present.     Left forearm: Normal.  Skin:    General: Skin is warm.  Psychiatric:        Behavior: Behavior is cooperative.      UC Treatments / Results  Labs (all labs ordered are listed, but only abnormal results are displayed) Labs Reviewed - No data to display  EKG   Radiology No results found.  Procedures Procedures (including critical care time)  Medications Ordered in UC Medications  methylPREDNISolone sodium succinate (SOLU-MEDROL) 125 mg/2 mL injection 80 mg (80 mg Intramuscular Given 06/05/19 1321)    Initial Impression / Assessment and Plan / UC Course  I have reviewed the triage vital signs and the nursing notes.  Pertinent labs & imaging results that were available during my care of the patient were reviewed by me and considered in my medical decision making (see chart for details).    Left upper arm pain and elbow pain, likely tendinitis flare. Solumedrol 80 mg IM ordered and administered. Tramadol prescribed for moderate to severe pain. Oral prednisone prescribed to start tomorrow.Left arm sling placed to immobilize extremity. Encouraged to advance activity as tolerated. Follow-up with PCP if no improvement of pain or ROM.  Final Clinical Impressions(s) / UC Diagnoses   Final diagnoses:  Left upper arm pain  Left elbow pain     Discharge Instructions     Start prednisone 40 mg once daily  for 5 days on tomorrow. Tramadol prescribed for moderate to severe pain.  Continue other arm sling as needed for immobilization.  If symptoms have not significantly improved in 48 hours please follow-up with your primary care provider as you may warrant further evaluation by orthopedic provider.    ED Prescriptions    Medication Sig Dispense Auth. Provider   predniSONE (DELTASONE) 20 MG tablet Take 2 tablets (40 mg total) by mouth daily with breakfast for 5 days. 10 tablet Scot Jun, FNP   traMADol (ULTRAM) 50 MG tablet Take 1 tablet (50 mg total) by mouth every 6 (six) hours as needed. 10 tablet Scot Jun, FNP     PDMP not reviewed this encounter.   Scot Jun, FNP 06/07/19 0730

## 2019-06-05 NOTE — ED Notes (Signed)
Placed in treatment room

## 2019-06-07 ENCOUNTER — Other Ambulatory Visit: Payer: Self-pay | Admitting: Family Medicine

## 2019-06-10 ENCOUNTER — Encounter: Payer: Self-pay | Admitting: Family Medicine

## 2019-06-16 ENCOUNTER — Ambulatory Visit: Payer: Medicare Other

## 2019-06-18 ENCOUNTER — Encounter: Payer: Self-pay | Admitting: Family Medicine

## 2019-06-25 ENCOUNTER — Ambulatory Visit: Payer: Medicare Other | Attending: Internal Medicine

## 2019-06-25 DIAGNOSIS — Z23 Encounter for immunization: Secondary | ICD-10-CM | POA: Insufficient documentation

## 2019-06-25 NOTE — Progress Notes (Signed)
   Covid-19 Vaccination Clinic  Name:  Ronnie Snyder    MRN: [id number] DOB: 1941-04-02  06/25/2019  Ronnie Snyder was observed post Covid-19 immunization for 15 minutes without incidence. He was provided with Vaccine Information Sheet and instruction to access the V-Safe system.   Ronnie Snyder was instructed to call 911 with any severe reactions post vaccine: Marland Kitchen Difficulty breathing  . Swelling of your face and throat  . A fast heartbeat  . A bad rash all over your body  . Dizziness and weakness    Immunizations Administered    Name Date Dose VIS Date Route   Pfizer COVID-19 Vaccine 06/25/2019 12:45 PM 0.3 mL 04/30/2019 Intramuscular   Manufacturer: [place]   Lot: [id number]   [place]: [id number]

## 2019-07-09 ENCOUNTER — Telehealth (HOSPITAL_COMMUNITY): Payer: Self-pay

## 2019-07-09 NOTE — Telephone Encounter (Signed)

## 2019-07-12 ENCOUNTER — Other Ambulatory Visit: Payer: Self-pay

## 2019-07-12 ENCOUNTER — Encounter: Payer: Self-pay | Admitting: Surgery

## 2019-07-12 ENCOUNTER — Ambulatory Visit (HOSPITAL_COMMUNITY)
Admission: RE | Admit: 2019-07-12 | Discharge: 2019-07-12 | Disposition: A | Discharge status: Home / Self Care | Payer: Medicare Other | Source: Ambulatory Visit | Attending: Surgery | Admitting: Surgery

## 2019-07-12 ENCOUNTER — Ambulatory Visit (INDEPENDENT_AMBULATORY_CARE_PROVIDER_SITE_OTHER)
Admission: RE | Admit: 2019-07-12 | Discharge: 2019-07-12 | Disposition: A | Discharge status: Home / Self Care | Payer: Medicare Other | Source: Ambulatory Visit | Attending: Surgery | Admitting: Surgery

## 2019-07-12 ENCOUNTER — Ambulatory Visit (INDEPENDENT_AMBULATORY_CARE_PROVIDER_SITE_OTHER): Payer: Medicare Other | Admitting: Surgery

## 2019-07-12 VITALS — BP 168/66 | HR 69 | Temp 97.3°F | Resp 20 | Ht 68.0 in | Wt 144.0 lb

## 2019-07-12 DIAGNOSIS — I714 Abdominal aortic aneurysm, without rupture, unspecified: Secondary | ICD-10-CM

## 2019-07-12 DIAGNOSIS — I739 Peripheral vascular disease, unspecified: Secondary | ICD-10-CM | POA: Insufficient documentation

## 2019-07-12 DIAGNOSIS — I70212 Atherosclerosis of native arteries of extremities with intermittent claudication, left leg: Secondary | ICD-10-CM | POA: Insufficient documentation

## 2019-07-12 DIAGNOSIS — I70213 Atherosclerosis of native arteries of extremities with intermittent claudication, bilateral legs: Secondary | ICD-10-CM | POA: Diagnosis not present

## 2019-07-12 MED ORDER — CILOSTAZOL 100 MG PO TABS: 100.0000 mg | ORAL_TABLET | Freq: Two times a day (BID) | ORAL | 11 refills | Status: DC

## 2019-07-12 NOTE — Progress Notes (Signed)
[facility]ist of [facility]  Patient name: Ronnie Snyder MRN: [id number] DOB: Apr 18, 1941 Sex: male   REASON FOR VISIT:    Follow up  HISOTRY OF PRESENT ILLNESS:    Ronnie Snyder is a 79 y.o. male who has a history of a left iliofemoral endarterectomy with patch angioplasty on 01/23/2012 by Dr. Scot Dock.  I performed left superficial femoral-popliteal atherectomy with stenting on 10/13/2012.  The patient has been lost to follow-up.  He now returns stating that he has having leg cramping after about 100-200 yards walk.  Both legs are affected equally.  He does not have any open wounds.  The patient has been taking care of his wife and so he has not been available to come to his visits.  He continues to be a light smoker.  He is medically managed for hypertension.  He has a statin intolerance.   PAST MEDICAL HISTORY:   Past Medical History:  Diagnosis Date  . Abdominal aortic aneurysm (HCC)    3.5 cm 10/2016 L-spine CT (previously 3.0 cm by U/S 02/25/11)  . Anxiety   . Arthritis    "in my feet" (10/13/2012)  . GERD (gastroesophageal reflux disease)   . H/O hiatal hernia   . Hepatic steatosis   . High cholesterol    "at one time; it's fine now" (10/13/2012)  . HTN (hypertension)   . Hx of colonic polyps   . Hypothyroidism   . PAD (peripheral artery disease) ([place])   . Skin cancer    "burned them off my arm and such" (10/13/2012)     FAMILY HISTORY:   Family History  Problem Relation Age of Onset  . Hypertension Mother   . Hypertension Father   . Hypertension Sister   . Hypertension Sister     SOCIAL HISTORY:   Social History   Tobacco Use  . Smoking status: Light Tobacco Smoker    Packs/day: 0.25    Years: 57.00    Pack years: 14.25    Types: Cigarettes  . Smokeless tobacco: Never Used  . Tobacco comment: pt states there is no hope for quitting  Substance Use Topics  . Alcohol use: Yes    Alcohol/week:  5.0 standard drinks    Types: 5 Cans of beer per week     ALLERGIES:   Allergies  Allergen Reactions  . Other     UNSPECIFIED REACTION  "ALL PAINS/NARCOTIC MEDS-CANNOT TOLERATE WELL" > per Med History prior to 05/23/17  . Atorvastatin Nausea Only  . Penicillins Rash    Has patient had a PCN reaction causing immediate rash, facial/tongue/throat swelling, SOB or lightheadedness with hypotension: Unknown Has patient had a PCN reaction causing severe rash involving mucus membranes or skin necrosis: No Has patient had a PCN reaction that required hospitalization: No Has patient had a PCN reaction occurring within the last 10 years: No If all of the above answers are "NO", then may proceed with Cephalosporin use.   . Pioglitazone Nausea Only  . Vicodin [Hydrocodone-Acetaminophen] Nausea Only     CURRENT MEDICATIONS:   Current Outpatient Medications  Medication Sig Dispense Refill  . ALPRAZolam (XANAX) 0.25 MG tablet TAKE 1 TABLET(0.25 MG) BY MOUTH TWICE DAILY AS NEEDED FOR ANXIETY 30 tablet 0  . amLODipine (NORVASC) 2.5 MG tablet Take 1 tablet (2.5 mg total) by mouth daily. 90 tablet 0  . clopidogrel (PLAVIX) 75 MG tablet TAKE 1 TABLET(75 MG) BY MOUTH DAILY 30 tablet 6  . diclofenac sodium (VOLTAREN)  1 % GEL Apply 2 g topically 4 (four) times daily. (Patient not taking: Reported on 05/03/2019) 100 g 1  . levothyroxine (SYNTHROID, LEVOTHROID) 75 MCG tablet Take 1 tablet (75 mcg total) by mouth daily before breakfast. 90 tablet 3  . losartan (COZAAR) 100 MG tablet TAKE 1 TABLET(100 MG) BY MOUTH DAILY 90 tablet 3  . tamsulosin (FLOMAX) 0.4 MG CAPS capsule Take 1 capsule (0.4 mg total) by mouth daily. 30 capsule 3  . traMADol (ULTRAM) 50 MG tablet Take 1 tablet (50 mg total) by mouth every 6 (six) hours as needed. 10 tablet 0   No current facility-administered medications for this visit.    REVIEW OF SYSTEMS:   [X]  denotes positive finding, [ ]  denotes negative finding Cardiac   Comments:  Chest pain or chest pressure:    Shortness of breath upon exertion:    Short of breath when lying flat:    Irregular heart rhythm:        Vascular    Pain in calf, thigh, or hip brought on by ambulation:    Pain in feet at night that wakes you up from your sleep:     Blood clot in your veins:    Leg swelling:         Pulmonary    Oxygen at home:    Productive cough:     Wheezing:         Neurologic    Sudden weakness in arms or legs:     Sudden numbness in arms or legs:     Sudden onset of difficulty speaking or slurred speech:    Temporary loss of vision in one eye:     Problems with dizziness:         Gastrointestinal    Blood in stool:     Vomited blood:         Genitourinary    Burning when urinating:     Blood in urine:        Psychiatric    Major depression:         Hematologic    Bleeding problems:    Problems with blood clotting too easily:        Skin    Rashes or ulcers:        Constitutional    Fever or chills:      PHYSICAL EXAM:   There were no vitals filed for this visit.  GENERAL: The patient is a well-nourished male, in no acute distress. The vital signs are documented above. CARDIAC: There is a regular rate and rhythm.  VASCULAR: I can palpate a left posterior tibial pulse.  I cannot palpate the right PULMONARY: Non-labored respirations MUSCULOSKELETAL: There are no major deformities or cyanosis. NEUROLOGIC: No focal weakness or paresthesias are detected. SKIN: There are no ulcers or rashes noted. PSYCHIATRIC: The patient has a normal affect.  STUDIES:   I have reviewed the following ABI/TBIToday's ABIToday's TBIPrevious ABIPrevious TBI  +-------+-----------+-----------+------------+------------+  Right 0.59    0.42    0.87    0.74      +-------+-----------+-----------+------------+------------+  Left  1.06    0.45    1.01    1.02       +-------+-----------+-----------+------------+------------+  Left: Patent stent with increased velocity in the proximal segment  (1-49%). Calcific plaque prevents thorough visualization.   MEDICAL ISSUES:   Lower extremity atherosclerotic vascular disease: I does not make sense that the patient's having bilateral leg symptoms and his ABI is 1  on the left.  I proposed angiography however the patient because of his wife's needs does not want to undergo procedure.  I will try him on cilostazol to see how he does.  He is to follow-up with me in 3 months.  AAA: The patient has a past medical history suggestive of an aneurysm.  I cannot find any imaging studies that document this and so I will order a ultrasound at his next visit in 3 months.    Leia Alf, MD, FACS [facility] of [facility] [phone] Pager [phone]

## 2019-07-13 ENCOUNTER — Other Ambulatory Visit: Payer: Self-pay | Admitting: *Deleted

## 2019-07-13 DIAGNOSIS — I714 Abdominal aortic aneurysm, without rupture, unspecified: Secondary | ICD-10-CM

## 2019-07-15 DIAGNOSIS — L570 Actinic keratosis: Secondary | ICD-10-CM | POA: Diagnosis not present

## 2019-07-15 DIAGNOSIS — L820 Inflamed seborrheic keratosis: Secondary | ICD-10-CM | POA: Diagnosis not present

## 2019-07-15 DIAGNOSIS — L821 Other seborrheic keratosis: Secondary | ICD-10-CM | POA: Diagnosis not present

## 2019-07-15 DIAGNOSIS — L814 Other melanin hyperpigmentation: Secondary | ICD-10-CM | POA: Diagnosis not present

## 2019-07-15 DIAGNOSIS — L905 Scar conditions and fibrosis of skin: Secondary | ICD-10-CM | POA: Diagnosis not present

## 2019-07-15 DIAGNOSIS — R202 Paresthesia of skin: Secondary | ICD-10-CM | POA: Diagnosis not present

## 2019-07-15 DIAGNOSIS — Z85828 Personal history of other malignant neoplasm of skin: Secondary | ICD-10-CM | POA: Diagnosis not present

## 2019-07-15 DIAGNOSIS — D229 Melanocytic nevi, unspecified: Secondary | ICD-10-CM | POA: Diagnosis not present

## 2019-07-16 ENCOUNTER — Telehealth: Payer: Self-pay

## 2019-07-20 ENCOUNTER — Ambulatory Visit: Payer: Medicare Other | Attending: Internal Medicine

## 2019-07-20 DIAGNOSIS — Z23 Encounter for immunization: Secondary | ICD-10-CM

## 2019-07-20 NOTE — Progress Notes (Signed)
   Covid-19 Vaccination Clinic  Name:  Ronnie Snyder    MRN: [id number] DOB: 01-06-1941  07/20/2019  Ronnie Snyder was observed post Covid-19 immunization for 15 minutes without incident. He was provided with Vaccine Information Sheet and instruction to access the V-Safe system.   Ronnie Snyder was instructed to call 911 with any severe reactions post vaccine: Marland Kitchen Difficulty breathing  . Swelling of face and throat  . A fast heartbeat  . A bad rash all over body  . Dizziness and weakness   Immunizations Administered    Name Date Dose VIS Date Route   Pfizer COVID-19 Vaccine 07/20/2019 12:38 PM 0.3 mL 04/30/2019 Intramuscular   Manufacturer: [place]   Lot: [id number]   [place]: [id number]

## 2019-07-21 ENCOUNTER — Other Ambulatory Visit: Payer: Self-pay | Admitting: Family Medicine

## 2019-07-26 ENCOUNTER — Telehealth: Payer: Self-pay

## 2019-07-26 NOTE — Telephone Encounter (Signed)
Pt called c/o Cilostazol is making him feel jittery, unbalanced and dizzy. Per Dr. Trula Slade, I have let pt know he can try taking half a pill in the AM and the other half in the night and if that does not help he can stop. Pt understands and is aware of his 3 month f/u appt. He will call us back with any further questions/concerns.

## 2019-07-28 NOTE — Telephone Encounter (Signed)
No additional nursing notes.

## 2019-07-31 ENCOUNTER — Ambulatory Visit (HOSPITAL_COMMUNITY)
Admission: EM | Admit: 2019-07-31 | Discharge: 2019-07-31 | Disposition: A | Discharge status: Home / Self Care | Payer: Medicare Other | Attending: Physician Assistant | Admitting: Physician Assistant

## 2019-07-31 ENCOUNTER — Other Ambulatory Visit: Payer: Self-pay

## 2019-07-31 ENCOUNTER — Encounter (HOSPITAL_COMMUNITY): Payer: Self-pay | Admitting: *Deleted

## 2019-07-31 DIAGNOSIS — M778 Other enthesopathies, not elsewhere classified: Secondary | ICD-10-CM

## 2019-07-31 MED ORDER — PREDNISONE 10 MG PO TABS: 40.0000 mg | ORAL_TABLET | Freq: Every day | ORAL | 0 refills | Status: AC

## 2019-07-31 MED ORDER — DICLOFENAC SODIUM 1 % EX GEL: 2.0000 g | Freq: Four times a day (QID) | CUTANEOUS | 0 refills | Status: DC

## 2019-07-31 MED ORDER — TRAMADOL HCL 50 MG PO TABS: 25.0000 mg | ORAL_TABLET | Freq: Every evening | ORAL | 0 refills | Status: AC | PRN

## 2019-07-31 NOTE — ED Provider Notes (Signed)
[place]    CSN: [id number] Arrival date & time: 07/31/19  1007      History   Chief Complaint Chief Complaint  Patient presents with  . Arm Pain    HPI Ronnie Snyder is a 79 y.o. male.   Patient with history of vascular disease and left elbow pain presents for acute left arm and elbow pain over the last 2 days.  Reports it is very painful to move the elbow and grip his fingers.  Reports pain is worse at night when laying on it.  Also noticed swelling on the inside of his elbow yesterday.  The swelling is not painful.  He denies pain with touch of the elbow or forearm.  Denies fever and chills.  Reports this is very similar to a previous incident that he was evaluated in this urgent care on January 2021.  He reports improvement after that treatment however he believes this time the pain is more severe.  Denies any history of injury to the left arm.  He is right-handed and does not have a lot of repetitive motion with his left arm.  He does caretake for his wife at home.  Denies numbness and tingling in the left hand.  Denies other joint pains.  He is followed by vascular surgery for abdominal aortic aneurysm and peripheral vascular disease.     Past Medical History:  Diagnosis Date  . Abdominal aortic aneurysm (HCC)    3.5 cm 10/2016 L-spine CT (previously 3.0 cm by U/S 02/25/11)  . Anxiety   . Arthritis    "in my feet" (10/13/2012)  . GERD (gastroesophageal reflux disease)   . H/O hiatal hernia   . Hepatic steatosis   . High cholesterol    "at one time; it's fine now" (10/13/2012)  . HTN (hypertension)   . Hx of colonic polyps   . Hypothyroidism   . PAD (peripheral artery disease) ([place])   . Skin cancer    "burned them off my arm and such" (10/13/2012)    Patient Active Problem List   Diagnosis Date Noted  . Urinary hesitancy 10/26/2018  . Night sweats 10/26/2018  . Other specified hypothyroidism 05/29/2018  . Lumbar stenosis 05/23/2017  . Anemia  10/02/2016  . Ecchymosis 09/23/2013  . PAD (peripheral artery disease) ([place]) 08/09/2013  . Aftercare following surgery of the circulatory system, [place] 02/01/2013  . Smoker 08/13/2012  . Other and unspecified alcohol dependence, unspecified drinking behavior 08/13/2012  . Screening for prostate cancer 08/13/2012  . Encounter for long-term (current) use of other medications 08/13/2012  . Atherosclerosis of native artery of extremity with intermittent claudication ([place]) 08/21/2011  . ECZEMA 01/24/2010  . COUGH 01/24/2009  . AAA (abdominal aortic aneurysm) ([place]) 12/06/2008  . HYPERCHOLESTEROLEMIA 01/21/2008  . ERECTILE DYSFUNCTION, ORGANIC 01/21/2008  . Back pain 01/21/2008  . 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 Anxiety state 03/07/2007  . ERECTILE DYSFUNCTION 03/07/2007  . DEPRESSION 03/07/2007  . Essential hypertension 03/07/2007  . CAROTID ARTERY STENOSIS 03/07/2007  . HIP PAIN, RIGHT 03/07/2007  . COLONIC POLYPS, HX OF 03/07/2007  . ADENOIDECTOMY, HX OF 03/07/2007    Past Surgical History:  Procedure Laterality Date  . ABDOMINAL ANGIOGRAM N/A 12/04/2011   Procedure: ABDOMINAL ANGIOGRAM;  Surgeon: Sherren Mocha, MD;  Location[facility] C[facility] LAB;  Service: Cardiovascular;  Laterality: N/A;  . ABDOMINAL AORTAGRAM N/A 10/13/2012   Procedure: ABDOMINAL Maxcine Ham;  Surgeon: Serafina Mitchell, MD;  Location: [facility] CATH LAB;  Service: Cardiovascular;  Laterality: N/A;  .  ANGIOPLASTY / STENTING FEMORAL Left 10/13/2012  . ESOPHAGOGASTRODUODENOSCOPY    . FEMORAL ENDARTERECTOMY Left 01/23/12   Left Endarterectomy  with bovie patch Angioplasy  . gated spect wall motion stress cardiolite  02/10/2002  . HIP FRACTURE SURGERY Right 1990's  . INGUINAL HERNIA REPAIR Right    "years ago" (10/13/2012)  . LUMBAR LAMINECTOMY/DECOMPRESSION MICRODISCECTOMY N/A 05/23/2017   Procedure: L4-5 DECOMPRESSION;  Surgeon: Marybelle Killings, MD;  Location: [place];  Service: Orthopedics;  Laterality: N/A;  . NOSE SURGERY    . TONSILLECTOMY      "I was a chld" (10/13/2012)       Home Medications    Prior to Admission medications   Medication Sig Start Date End Date Taking? Authorizing Provider  amLODipine (NORVASC) 2.5 MG tablet Take 1 tablet (2.5 mg total) by mouth daily. 02/08/19  Yes Luetta Nutting, DO  levothyroxine (SYNTHROID) 75 MCG tablet Take 1qd (Plz sched visit with new provider) 07/21/19  Yes Libby Maw, MD  losartan (COZAAR) 100 MG tablet TAKE 1 TABLET(100 MG) BY MOUTH DAILY 06/07/19  Yes Luetta Nutting, DO  tamsulosin (FLOMAX) 0.4 MG CAPS capsule Take 1 capsule (0.4 mg total) by mouth daily. 10/30/18  Yes Luetta Nutting, DO  ALPRAZolam Duanne Moron) 0.25 MG tablet TAKE 1 TABLET(0.25 MG) BY MOUTH TWICE DAILY AS NEEDED FOR ANXIETY 12/02/18   Luetta Nutting, DO  cilostazol (PLETAL) 100 MG tablet Take 1 tablet (100 mg total) by mouth 2 (two) times daily before a meal. 07/12/19   Serafina Mitchell, MD  clopidogrel (PLAVIX) 75 MG tablet TAKE 1 TABLET(75 MG) BY MOUTH DAILY 06/07/19   Luetta Nutting, DO  diclofenac Sodium (VOLTAREN) 1 % GEL Apply 2 g topically 4 (four) times daily. 07/31/19   Brayant Dorr, Marguerita Beards, PA-C  predniSONE (DELTASONE) 10 MG tablet Take 4 tablets (40 mg total) by mouth daily for 5 days. 07/31/19 08/05/19  Katherine Syme, Marguerita Beards, PA-C  traMADol (ULTRAM) 50 MG tablet Take 0.5 tablets (25 mg total) by mouth at bedtime as needed for up to 3 days for moderate pain or severe pain. 07/31/19 08/03/19  Jabree Pernice, Marguerita Beards, PA-C    Family History Family History  Problem Relation Age of Onset  . Hypertension Mother   . Hypertension Father   . Hypertension Sister   . Hypertension Sister     Social History Social History   Tobacco Use  . Smoking status: Former Smoker    Packs/day: 0.25    Years: 57.00    Pack years: 14.25    Types: Cigarettes  . Smokeless tobacco: Never Used  . Tobacco comment: pt states there is no hope for quitting  Substance Use Topics  . Alcohol use: Not Currently  . Drug use: No     Allergies    Other, Atorvastatin, Penicillins, Pioglitazone, and Vicodin [hydrocodone-acetaminophen]   Review of Systems Review of Systems  Constitutional: Negative for chills and fever.  Musculoskeletal: Positive for arthralgias, joint swelling and myalgias. Negative for back pain.  Skin: Negative for color change and wound.     Physical Exam Triage Vital Signs ED Triage Vitals  Enc Vitals Group     BP      Pulse      Resp      Temp      Temp src      SpO2      Weight      Height      Head Circumference      Peak Flow  Pain Score      Pain Loc      Pain Edu?      Excl. in [place]?    No data found.  Updated Vital Signs BP (!) 152/61   Pulse 87   Temp 98.6 F (37 C) (Oral)   Resp 18   SpO2 96%   Visual Acuity Right Eye Distance:   Left Eye Distance:   Bilateral Distance:    Right Eye Near:   Left Eye Near:    Bilateral Near:     Physical Exam Vitals and nursing note reviewed.  Constitutional:      Appearance: He is well-developed.  HENT:     Head: Normocephalic and atraumatic.  Eyes:     Conjunctiva/sclera: Conjunctivae normal.  Cardiovascular:     Rate and Rhythm: Normal rate.  Pulmonary:     Effort: Pulmonary effort is normal. No respiratory distress.  Musculoskeletal:     Cervical back: Neck supple.     Comments: Medial swelling for left elbow.  Nontender throughout joint..  There is mild erythema surrounding elbow joint.  With reported pain at terminal range of motion flexion and extension.  No obvious sign of infection.  2+ radial pulse and sensation normal and equal in left hand compared to right.  Cap refill less than 2  No bruit noted on medial swelling of left elbow.  Skin:    General: Skin is warm and dry.     Capillary Refill: Capillary refill takes less than 2 seconds.  Neurological:     General: No focal deficit present.     Mental Status: He is alert and oriented to person, place, and time.      UC Treatments / Results  Labs (all labs  ordered are listed, but only abnormal results are displayed) Labs Reviewed - No data to display  EKG   Radiology No results found.  Procedures Procedures (including critical care time)  Medications Ordered in UC Medications - No data to display  Initial Impression / Assessment and Plan / UC Course  I have reviewed the triage vital signs and the nursing notes.  Pertinent labs & imaging results that were available during my care of the patient were reviewed by me and considered in my medical decision making (see chart for details).     #Tendinitis Patient is 79 year old presenting with symptoms consistent with left elbow tendinitis.  He is afebrile, nontender and there is no point of entry for infectious etiology.  Given its similarity to previous presentation and response to anti-inflammatory medications at that time most likely diagnosis is inflammatory with consideration for tendinopathy at this time.  Will start on prednisone for 5 days and given diclofenac gel instructions.  Encouraged follow-up with primary care for continued monitoring of left elbow pain as this is a recurrent issue. -Discussed that if swelling on medial aspect of the elbow were to increase patient should return to clinic for reevaluation.  Final Clinical Impressions(s) / UC Diagnoses   Final diagnoses:  Elbow tendonitis     Discharge Instructions     Take the prednisone for 5 days You may take half a tablet of tramadol at night to help you sleep and aid in the pain relief.  Do not drive or drink alcohol when taking this  Use the diclofenac/Voltaren gel with areas of pain up to 4 times a day  If swelling on your elbow becomes significantly worse please return or go to the emergency department   Please  schedule follow-up appoint with your primary care to discuss longer-term management of your elbow and arm pain.     ED Prescriptions    Medication Sig Dispense Auth. Provider   predniSONE  (DELTASONE) 10 MG tablet Take 4 tablets (40 mg total) by mouth daily for 5 days. 20 tablet Arvid Marengo, Marguerita Beards, PA-C   traMADol (ULTRAM) 50 MG tablet Take 0.5 tablets (25 mg total) by mouth at bedtime as needed for up to 3 days for moderate pain or severe pain. 5 tablet Brilee Port, Marguerita Beards, PA-C   diclofenac Sodium (VOLTAREN) 1 % GEL Apply 2 g topically 4 (four) times daily. 100 g Merrisa Skorupski, Marguerita Beards, PA-C     I have reviewed the PDMP during this encounter.   Purnell Shoemaker, PA-C 07/31/19 1212

## 2019-07-31 NOTE — Discharge Instructions (Signed)
Take the prednisone for 5 days You may take half a tablet of tramadol at night to help you sleep and aid in the pain relief.  Do not drive or drink alcohol when taking this  Use the diclofenac/Voltaren gel with areas of pain up to 4 times a day  If swelling on your elbow becomes significantly worse please return or go to the emergency department   Please schedule follow-up appoint with your primary care to discuss longer-term management of your elbow and arm pain.

## 2019-07-31 NOTE — ED Triage Notes (Signed)
Denies injury.  States woke up yesterday with constant left forearm pain that radiates slightly into upper arm and down into wrist.  C/O very painful ROM.  LUE CMS intact.  States "I can hardly get my clothes on it hurts so bad to move it".  States feels like past tendonitis.

## 2019-08-02 ENCOUNTER — Telehealth: Payer: Self-pay | Admitting: Emergency Medicine

## 2019-08-02 NOTE — Telephone Encounter (Signed)
This patient has never been seen by our practice, has no referrals to our practice, no upcoming appts, no missed appts, or any other correspondence with our office documented.    Will close encounter.

## 2019-08-04 ENCOUNTER — Telehealth: Payer: Self-pay | Admitting: Physician Assistant

## 2019-08-04 NOTE — Telephone Encounter (Signed)
[name] calls today stating his lower extremity pain is worsening and can now only walk short distances due to what he describes as "muscular pain".  He says he feels the pain from his waist down.  He denies rest pain.  He denies cold temperature to his feet or pale color.  I reviewed his chart.  He quit taking his Pletal citing no improvement.  He continues his Plavix.  Dr. Trula Slade had offered arteriogram at his last visit but the patient declined.  He is in agreement with this plan today.  We will call him back and schedule aortogram with bilateral lower extremity runoff at the next available time slot.

## 2019-08-09 ENCOUNTER — Emergency Department (HOSPITAL_COMMUNITY): Payer: Medicare Other

## 2019-08-09 ENCOUNTER — Other Ambulatory Visit: Payer: Self-pay

## 2019-08-09 ENCOUNTER — Inpatient Hospital Stay (HOSPITAL_COMMUNITY)
Admission: EM | Admit: 2019-08-09 | Discharge: 2019-08-15 | DRG: 177 | Disposition: A | Discharge status: Home Health | Payer: Medicare Other | Attending: Internal Medicine | Admitting: Internal Medicine

## 2019-08-09 ENCOUNTER — Encounter (HOSPITAL_COMMUNITY): Payer: Self-pay | Admitting: Internal Medicine

## 2019-08-09 DIAGNOSIS — I6330 Cerebral infarction due to thrombosis of unspecified cerebral artery: Secondary | ICD-10-CM

## 2019-08-09 DIAGNOSIS — E7800 Pure hypercholesterolemia, unspecified: Secondary | ICD-10-CM | POA: Diagnosis present

## 2019-08-09 DIAGNOSIS — Z79899 Other long term (current) drug therapy: Secondary | ICD-10-CM

## 2019-08-09 DIAGNOSIS — R0902 Hypoxemia: Secondary | ICD-10-CM | POA: Diagnosis not present

## 2019-08-09 DIAGNOSIS — Z85828 Personal history of other malignant neoplasm of skin: Secondary | ICD-10-CM

## 2019-08-09 DIAGNOSIS — E785 Hyperlipidemia, unspecified: Secondary | ICD-10-CM | POA: Diagnosis present

## 2019-08-09 DIAGNOSIS — I6522 Occlusion and stenosis of left carotid artery: Secondary | ICD-10-CM

## 2019-08-09 DIAGNOSIS — K760 Fatty (change of) liver, not elsewhere classified: Secondary | ICD-10-CM | POA: Diagnosis present

## 2019-08-09 DIAGNOSIS — I739 Peripheral vascular disease, unspecified: Secondary | ICD-10-CM | POA: Diagnosis present

## 2019-08-09 DIAGNOSIS — E876 Hypokalemia: Secondary | ICD-10-CM | POA: Diagnosis present

## 2019-08-09 DIAGNOSIS — I10 Essential (primary) hypertension: Secondary | ICD-10-CM | POA: Diagnosis present

## 2019-08-09 DIAGNOSIS — Z66 Do not resuscitate: Secondary | ICD-10-CM | POA: Diagnosis present

## 2019-08-09 DIAGNOSIS — I639 Cerebral infarction, unspecified: Secondary | ICD-10-CM | POA: Diagnosis present

## 2019-08-09 DIAGNOSIS — I714 Abdominal aortic aneurysm, without rupture, unspecified: Secondary | ICD-10-CM | POA: Diagnosis present

## 2019-08-09 DIAGNOSIS — D649 Anemia, unspecified: Secondary | ICD-10-CM | POA: Diagnosis not present

## 2019-08-09 DIAGNOSIS — F17200 Nicotine dependence, unspecified, uncomplicated: Secondary | ICD-10-CM | POA: Diagnosis present

## 2019-08-09 DIAGNOSIS — U071 COVID-19: Secondary | ICD-10-CM | POA: Diagnosis not present

## 2019-08-09 DIAGNOSIS — E039 Hypothyroidism, unspecified: Secondary | ICD-10-CM | POA: Diagnosis present

## 2019-08-09 DIAGNOSIS — D62 Acute posthemorrhagic anemia: Secondary | ICD-10-CM | POA: Diagnosis present

## 2019-08-09 DIAGNOSIS — G9341 Metabolic encephalopathy: Secondary | ICD-10-CM | POA: Diagnosis not present

## 2019-08-09 DIAGNOSIS — Z79890 Hormone replacement therapy (postmenopausal): Secondary | ICD-10-CM

## 2019-08-09 DIAGNOSIS — J9601 Acute respiratory failure with hypoxia: Secondary | ICD-10-CM | POA: Diagnosis not present

## 2019-08-09 DIAGNOSIS — R05 Cough: Secondary | ICD-10-CM | POA: Diagnosis not present

## 2019-08-09 DIAGNOSIS — R0602 Shortness of breath: Secondary | ICD-10-CM

## 2019-08-09 DIAGNOSIS — D509 Iron deficiency anemia, unspecified: Secondary | ICD-10-CM | POA: Diagnosis present

## 2019-08-09 DIAGNOSIS — J9611 Chronic respiratory failure with hypoxia: Secondary | ICD-10-CM | POA: Diagnosis present

## 2019-08-09 DIAGNOSIS — N400 Enlarged prostate without lower urinary tract symptoms: Secondary | ICD-10-CM | POA: Diagnosis present

## 2019-08-09 DIAGNOSIS — R29810 Facial weakness: Secondary | ICD-10-CM | POA: Diagnosis present

## 2019-08-09 DIAGNOSIS — R29702 NIHSS score 2: Secondary | ICD-10-CM | POA: Diagnosis present

## 2019-08-09 DIAGNOSIS — K5900 Constipation, unspecified: Secondary | ICD-10-CM | POA: Diagnosis present

## 2019-08-09 DIAGNOSIS — Z791 Long term (current) use of non-steroidal anti-inflammatories (NSAID): Secondary | ICD-10-CM

## 2019-08-09 DIAGNOSIS — I959 Hypotension, unspecified: Secondary | ICD-10-CM | POA: Diagnosis not present

## 2019-08-09 DIAGNOSIS — Z7902 Long term (current) use of antithrombotics/antiplatelets: Secondary | ICD-10-CM

## 2019-08-09 DIAGNOSIS — E8770 Fluid overload, unspecified: Secondary | ICD-10-CM | POA: Diagnosis present

## 2019-08-09 DIAGNOSIS — I951 Orthostatic hypotension: Secondary | ICD-10-CM | POA: Diagnosis not present

## 2019-08-09 DIAGNOSIS — R531 Weakness: Secondary | ICD-10-CM | POA: Diagnosis not present

## 2019-08-09 LAB — CBC WITH DIFFERENTIAL/PLATELET
Abs Immature Granulocytes: 0.05 10*3/uL (ref 0.00–0.07)
Basophils Absolute: 0 10*3/uL (ref 0.0–0.1)
Basophils Relative: 0 %
Eosinophils Absolute: 0 10*3/uL (ref 0.0–0.5)
Eosinophils Relative: 0 %
HCT: 20.4 % — ABNORMAL LOW (ref 39.0–52.0)
Hemoglobin: 4.8 g/dL — CL (ref 13.0–17.0)
Immature Granulocytes: 1 %
Lymphocytes Relative: 18 %
Lymphs Abs: 0.9 10*3/uL (ref 0.7–4.0)
MCH: 17.2 pg — ABNORMAL LOW (ref 26.0–34.0)
MCHC: 23.5 g/dL — ABNORMAL LOW (ref 30.0–36.0)
MCV: 73.1 fL — ABNORMAL LOW (ref 80.0–100.0)
Monocytes Absolute: 0.8 10*3/uL (ref 0.1–1.0)
Monocytes Relative: 16 %
Neutro Abs: 3.1 10*3/uL (ref 1.7–7.7)
Neutrophils Relative %: 65 %
Platelets: 285 10*3/uL (ref 150–400)
RBC: 2.79 MIL/uL — ABNORMAL LOW (ref 4.22–5.81)
RDW: 18.4 % — ABNORMAL HIGH (ref 11.5–15.5)
WBC: 4.9 10*3/uL (ref 4.0–10.5)
nRBC: 1.6 % — ABNORMAL HIGH (ref 0.0–0.2)

## 2019-08-09 LAB — POC OCCULT BLOOD, ED: Fecal Occult Bld: NEGATIVE

## 2019-08-09 LAB — COMPREHENSIVE METABOLIC PANEL
ALT: 164 U/L — ABNORMAL HIGH (ref 0–44)
AST: 129 U/L — ABNORMAL HIGH (ref 15–41)
Albumin: 3.5 g/dL (ref 3.5–5.0)
Alkaline Phosphatase: 82 U/L (ref 38–126)
Anion gap: 11 (ref 5–15)
BUN: 31 mg/dL — ABNORMAL HIGH (ref 8–23)
CO2: 29 mmol/L (ref 22–32)
Calcium: 8.6 mg/dL — ABNORMAL LOW (ref 8.9–10.3)
Chloride: 98 mmol/L (ref 98–111)
Creatinine, Ser: 0.9 mg/dL (ref 0.61–1.24)
GFR calc Af Amer: >60 mL/min (ref >60)
GFR calc non Af Amer: >60 mL/min (ref >60)
Glucose, Bld: 118 mg/dL — ABNORMAL HIGH (ref 70–99)
Potassium: 4.7 mmol/L (ref 3.5–5.1)
Sodium: 138 mmol/L (ref 135–145)
Total Bilirubin: 0.7 mg/dL (ref 0.3–1.2)
Total Protein: 6 g/dL — ABNORMAL LOW (ref 6.5–8.1)

## 2019-08-09 LAB — PREPARE RBC (CROSSMATCH)

## 2019-08-09 MED ORDER — TAMSULOSIN HCL 0.4 MG PO CAPS
0.4000 mg | ORAL_CAPSULE | Freq: Every day | ORAL | Status: DC
  Administered 2019-08-10 – 2019-08-11 (×2): 0.4 mg via ORAL
  Filled 2019-08-09 (×2): qty 1

## 2019-08-09 MED ORDER — LOSARTAN POTASSIUM 50 MG PO TABS
100.0000 mg | ORAL_TABLET | Freq: Every day | ORAL | Status: DC
  Administered 2019-08-10 – 2019-08-11 (×2): 100 mg via ORAL
  Filled 2019-08-09 (×2): qty 2

## 2019-08-09 MED ORDER — ACETAMINOPHEN 650 MG RE SUPP: 650.0000 mg | Freq: Four times a day (QID) | RECTAL | Status: DC | PRN

## 2019-08-09 MED ORDER — CILOSTAZOL 100 MG PO TABS
100.0000 mg | ORAL_TABLET | Freq: Two times a day (BID) | ORAL | Status: DC
  Administered 2019-08-10 – 2019-08-15 (×11): 100 mg via ORAL
  Filled 2019-08-09 (×11): qty 1

## 2019-08-09 MED ORDER — ACETAMINOPHEN 325 MG PO TABS
650.0000 mg | ORAL_TABLET | Freq: Four times a day (QID) | ORAL | Status: DC | PRN
  Administered 2019-08-13: 20:00:00 650 mg via ORAL
  Filled 2019-08-09: qty 2

## 2019-08-09 MED ORDER — AMLODIPINE BESYLATE 2.5 MG PO TABS
2.5000 mg | ORAL_TABLET | Freq: Every day | ORAL | Status: DC
  Administered 2019-08-10 – 2019-08-11 (×2): 2.5 mg via ORAL
  Filled 2019-08-09 (×2): qty 1

## 2019-08-09 MED ORDER — LEVOTHYROXINE SODIUM 75 MCG PO TABS
75.0000 ug | ORAL_TABLET | Freq: Every day | ORAL | Status: DC
  Administered 2019-08-10 – 2019-08-15 (×6): 75 ug via ORAL
  Filled 2019-08-09 (×6): qty 1

## 2019-08-09 MED ORDER — ONDANSETRON HCL 4 MG PO TABS: 4.0000 mg | ORAL_TABLET | Freq: Four times a day (QID) | ORAL | Status: DC | PRN

## 2019-08-09 MED ORDER — SODIUM CHLORIDE 0.9 % IV SOLN: 10.0000 mL/h | Freq: Once | INTRAVENOUS | Status: DC

## 2019-08-09 MED ORDER — ONDANSETRON HCL 4 MG/2ML IJ SOLN: 4.0000 mg | Freq: Four times a day (QID) | INTRAMUSCULAR | Status: DC | PRN

## 2019-08-09 NOTE — ED Triage Notes (Signed)
X 6 weeks of progressive weakness.  Hx of previous blood transfusions d/t anemia.  Intake wnl reportedly, No N/V/D.   No BM x 2 days.  EMS states unable to ambulate without gross (A).  Denies any pain.

## 2019-08-09 NOTE — H&P (Addendum)
History and Physical    Ronnie Snyder [id number] DOB: September 10, 1940 DOA: 08/09/2019  PCP: Luetta Nutting, DO  Patient coming from: Home.  Chief Complaint: Weakness.  Confusion.  History obtained from patient's wife as patient has confusion.  HPI: Ronnie Snyder is a 79 y.o. male with history of peripheral vascular disease, hypothyroidism, hypertension, abdominal aortic aneurysm was brought to the ER after patient was getting very weak and confused over the last 48 hours.  As per the patient's wife patient did not have any fall or did not have any fever chills chest pain shortness of breath productive cough.  No change in medications.  Admitting chronic lower extremity pain for which patient follows with Dr. Trula Slade vascular surgeon and no changes were made recently.  ED Course: In the ER patient is oriented to his name but otherwise confused.  Chest x-ray shows possibility of pneumonia.  Hemoglobin is around 4.8 stool for occult blood is negative.  Labs also show AST of 129 ALT of 164 patient does not drink alcohol.  Stool for occult blood was negative.  Patient was started on 2 units of PRBC transfusion at the time of my exam patient is moving all extremities but is only oriented to his name and place.  Appears confused.  Patient also was requiring 4 L oxygen in the ER.  Review of Systems: As per HPI, rest all negative.   Past Medical History:  Diagnosis Date  . Abdominal aortic aneurysm (HCC)    3.5 cm 10/2016 L-spine CT (previously 3.0 cm by U/S 02/25/11)  . Anxiety   . Arthritis    "in my feet" (10/13/2012)  . GERD (gastroesophageal reflux disease)   . H/O hiatal hernia   . Hepatic steatosis   . High cholesterol    "at one time; it's fine now" (10/13/2012)  . HTN (hypertension)   . Hx of colonic polyps   . Hypothyroidism   . PAD (peripheral artery disease) ([place])   . Skin cancer    "burned them off my arm and such" (10/13/2012)    Past Surgical History:  Procedure  Laterality Date  . ABDOMINAL ANGIOGRAM N/A 12/04/2011   Procedure: ABDOMINAL ANGIOGRAM;  Surgeon: Sherren Mocha, MD;  [facility] CATH LAB;  Service: Cardiovascular;  Laterality: N/A;  . ABDOMINAL AORTAGRAM N/A 10/13/2012   Procedure: ABDOMINAL Maxcine Ham;  Surgeon: Serafina Mitchell, MD;  Location: [facility] CATH LAB;  Service: Cardiovascular;  Laterality: N/A;  . ANGIOPLASTY / STENTING FEMORAL Left 10/13/2012  . ESOPHAGOGASTRODUODENOSCOPY    . FEMORAL ENDARTERECTOMY Left 01/23/12   Left Endarterectomy  with bovie patch Angioplasy  . gated spect wall motion stress cardiolite  02/10/2002  . HIP FRACTURE SURGERY Right 1990's  . INGUINAL HERNIA REPAIR Right    "years ago" (10/13/2012)  . LUMBAR LAMINECTOMY/DECOMPRESSION MICRODISCECTOMY N/A 05/23/2017   Procedure: L4-5 DECOMPRESSION;  Surgeon: Marybelle Killings, MD;  Location: [place];  Service: Orthopedics;  Laterality: N/A;  . NOSE SURGERY    . TONSILLECTOMY     "I was a chld" (10/13/2012)     reports that he has quit smoking. His smoking use included cigarettes. He has a 14.25 pack-year smoking history. He has never used smokeless tobacco. He reports previous alcohol use. He reports that he does not use drugs.  Allergies  Allergen Reactions  . Other     UNSPECIFIED REACTION  "ALL PAINS/NARCOTIC MEDS-CANNOT TOLERATE WELL" > per Med History prior to 05/23/17  . Atorvastatin Nausea Only  . Penicillins Rash  Has patient had a PCN reaction causing immediate rash, facial/tongue/throat swelling, SOB or lightheadedness with hypotension: Unknown Has patient had a PCN reaction causing severe rash involving mucus membranes or skin necrosis: No Has patient had a PCN reaction that required hospitalization: No Has patient had a PCN reaction occurring within the last 10 years: No If all of the above answers are "NO", then may proceed with Cephalosporin use.   . Pioglitazone Nausea Only  . Vicodin [Hydrocodone-Acetaminophen] Nausea Only    Family History  Problem  Relation Age of Onset  . Hypertension Mother   . Hypertension Father   . Hypertension Sister   . Hypertension Sister     Prior to Admission medications   Medication Sig Start Date End Date Taking? Authorizing Provider  amLODipine (NORVASC) 2.5 MG tablet Take 1 tablet (2.5 mg total) by mouth daily. 02/08/19  Yes Luetta Nutting, DO  cilostazol (PLETAL) 100 MG tablet Take 1 tablet (100 mg total) by mouth 2 (two) times daily before a meal. 07/12/19  Yes Serafina Mitchell, MD  clopidogrel (PLAVIX) 75 MG tablet TAKE 1 TABLET(75 MG) BY MOUTH DAILY Patient taking differently: Take 75 mg by mouth daily.  06/07/19  Yes Luetta Nutting, DO  diclofenac Sodium (VOLTAREN) 1 % GEL Apply 2 g topically 4 (four) times daily. 07/31/19  Yes Darr, Marguerita Beards, PA-C  levothyroxine (SYNTHROID) 75 MCG tablet Take 1qd (Plz sched visit with new provider) Patient taking differently: Take 75 mcg by mouth daily.  07/21/19  Yes Libby Maw, MD  tamsulosin (FLOMAX) 0.4 MG CAPS capsule Take 1 capsule (0.4 mg total) by mouth daily. 10/30/18  Yes Luetta Nutting, DO  ALPRAZolam (XANAX) 0.25 MG tablet TAKE 1 TABLET(0.25 MG) BY MOUTH TWICE DAILY AS NEEDED FOR ANXIETY Patient not taking: Reported on 08/09/2019 12/02/18   Luetta Nutting, DO  losartan (COZAAR) 100 MG tablet TAKE 1 TABLET(100 MG) BY MOUTH DAILY Patient not taking: Reported on 08/09/2019 06/07/19   Luetta Nutting, DO    Physical Exam: Constitutional: Moderately built and nourished. Vitals:   08/09/19 2200 08/09/19 2215 08/09/19 2245 08/09/19 2300  BP: 131/63 (!) 142/70 130/69 137/67  Pulse: 88 89 88 85  Resp: (!) 24 (!) 21 (!) 21 (!) 21  Temp:      TempSrc:      SpO2: 100% 100% 100% 100%  Weight:      Height:       Eyes: Anicteric no pallor. ENMT: No discharge from the ears eyes nose or mouth. Neck: No masses.  No neck rigidity. Respiratory: No rhonchi or crepitations. Cardiovascular: S1-S2 heard. Abdomen: Soft nontender bowel sounds  present. Musculoskeletal: No edema. Skin: No rash. Neurologic: Patient is alert awake oriented to his name and place but does not know why he is here.  Moves all extremities. Psychiatric: Appears confused.   Labs on Admission: I have personally reviewed following labs and imaging studies  CBC: Recent Labs  Lab 08/09/19 1917  WBC 4.9  NEUTROABS 3.1  HGB 4.8*  HCT 20.4*  MCV 73.1*  PLT [id number]   Basic Metabolic Panel: Recent Labs  Lab 08/09/19 1917  NA 138  K 4.7  CL 98  CO2 29  GLUCOSE 118*  BUN 31*  CREATININE 0.90  CALCIUM 8.6*   GFR: Estimated Creatinine Clearance: 63.5 mL/min (by C-G formula based on SCr of 0.9 mg/dL). Liver Function Tests: Recent Labs  Lab 08/09/19 1917  AST 129*  ALT 164*  ALKPHOS 82  BILITOT 0.7  PROT 6.0*  ALBUMIN 3.5   No results for input(s): LIPASE, AMYLASE in the last 168 hours. No results for input(s): AMMONIA in the last 168 hours. Coagulation Profile: No results for input(s): INR, PROTIME in the last 168 hours. Cardiac Enzymes: No results for input(s): CKTOTAL, CKMB, CKMBINDEX, TROPONINI in the last 168 hours. BNP (last 3 results) No results for input(s): PROBNP in the last 8760 hours. HbA1C: No results for input(s): HGBA1C in the last 72 hours. CBG: No results for input(s): GLUCAP in the last 168 hours. Lipid Profile: No results for input(s): CHOL, HDL, LDLCALC, TRIG, CHOLHDL, LDLDIRECT in the last 72 hours. Thyroid Function Tests: No results for input(s): TSH, T4TOTAL, FREET4, T3FREE, THYROIDAB in the last 72 hours. Anemia Panel: No results for input(s): VITAMINB12, FOLATE, FERRITIN, TIBC, IRON, RETICCTPCT in the last 72 hours. Urine analysis:    Component Value Date/Time   COLORURINE YELLOW 10/02/2017 0824   APPEARANCEUR Cloudy (A) 10/02/2017 0824   LABSPEC 1.020 12/17/2018 0843   PHURINE 7.0 12/17/2018 0843   GLUCOSEU NEGATIVE 12/17/2018 0843   GLUCOSEU NEGATIVE 10/02/2017 0824   HGBUR NEGATIVE 12/17/2018 0843    HGBUR negative 11/13/2007 1003   BILIRUBINUR NEGATIVE 12/17/2018 0843   BILIRUBINUR NEGATIVE 10/26/2018 1027   KETONESUR NEGATIVE 12/17/2018 0843   PROTEINUR NEGATIVE 12/17/2018 0843   UROBILINOGEN 0.2 12/17/2018 0843   NITRITE NEGATIVE 12/17/2018 0843   LEUKOCYTESUR NEGATIVE 12/17/2018 0843   Sepsis Labs: @LABRCNTIP (procalcitonin:4,lacticidven:4) )No results found for this or any previous visit (from the past 240 hour(s)).   Radiological Exams on Admission: DG Chest Port 1 View  Result Date: 08/09/2019 CLINICAL DATA:  Cough, progressive weakness, symptoms for 6 weeks EXAM: PORTABLE CHEST 1 VIEW COMPARISON:  12/17/2018 FINDINGS: Single frontal view of the chest demonstrates an unremarkable cardiac silhouette. There is right basilar consolidation and small right pleural effusion. No pneumothorax. Left chest is clear. No acute bony abnormalities. IMPRESSION: 1. Right basilar consolidation and small right pleural effusion. Findings could reflect bronchopneumonia. Followup PA and lateral chest X-ray is recommended in 3-4 weeks following trial of antibiotic therapy to ensure resolution. Electronically Signed   By: Randa Ngo M.D.   On: 08/09/2019 21:31     Assessment/Plan Principal Problem:   Acute respiratory failure with hypoxia (HCC) Active Problems:   Essential hypertension   AAA (abdominal aortic aneurysm) (HCC)   PAD (peripheral artery disease) (HCC)   Symptomatic anemia    1. Acute respiratory failure with hypoxia could be from pneumonia and anemia.  Covid test is pending.  We will place patient on empiric antibiotics.  If Covid test comes back positive patient likely will need Decadron and remdesivir and at the time will order inflammatory markers.  Since patient also has some pleural effusion will check BNP. 2. Severe anemia -cause not clear.  As per the patient's wife patient has required transfusion in 2019 at the time no follow-up was done with pathologist or  gastroenterologist.  Because of anemia is not clear.  At this time 2 units of PRBC has been already ordered.  Check anemia panel but patient has already received PRBC.  May need hematology input in [place] input. 3. Acute encephalopathy cause not clear.  Patient is afebrile.  Will check MRI brain and also ammonia levels RPR B12 and folate levels. 4. Hypertension on Cozaar and amlodipine. 5. History of peripheral vascular disease on Pletal and Plavix. 6. History of abdominal aortic aneurysm denies any abdominal pain.   Addendum -patient's Covid test came back positive.  Patient has  been started on Decadron and remdesivir given that patient has hypoxia.  Inflammatory markers are pending.  Patient is also on empiric antibiotics Levaquin for community-acquired pneumonia.  BNP is pending.  Levaquin can be discontinued if patient's procalcitonin is negative.   Given that patient has acute respiratory failure with hypoxia with Covid infection and severe anemia acute encephalopathy will need close monitoring for any further worsening in inpatient status.   DVT prophylaxis: SCDs for now since patient has significant anemia. Code Status: DNR confirmed with patient's wife. Family Communication: Patient's wife. Disposition Plan: To be determined. Consults called: None. Admission status: Inpatient.   Rise Patience MD [facility] Pager [phone].  If 7PM-7AM, please contact night-coverage [email or web address] Password [facility]  08/09/2019, 11:16 PM

## 2019-08-09 NOTE — ED Provider Notes (Signed)
[facility] EMERGENCY DEPARTMENT Provider Note   CSN: [id number] Arrival date & time: 08/09/19  1651     History No chief complaint on file.   Ronnie Snyder is a 79 y.o. male.  HPI He presents by EMS, family members called, because he was weak and seemed confused.  They were worried that he was having a stroke.  He has been gradually worse with weakness for several weeks.  He has a history of anemia requiring transfusions.  He is due to have peripheral vascular imaging of the lower extremities, soon.  There is been no fever, cough, vomiting or falling.  Patient is a poor historian.  History is from wife and son who are at the bedside.  There are no other known modifying factors.    Past Medical History:  Diagnosis Date  . Abdominal aortic aneurysm (HCC)    3.5 cm 10/2016 L-spine CT (previously 3.0 cm by U/S 02/25/11)  . Anxiety   . Arthritis    "in my feet" (10/13/2012)  . GERD (gastroesophageal reflux disease)   . H/O hiatal hernia   . Hepatic steatosis   . High cholesterol    "at one time; it's fine now" (10/13/2012)  . HTN (hypertension)   . Hx of colonic polyps   . Hypothyroidism   . PAD (peripheral artery disease) ([place])   . Skin cancer    "burned them off my arm and such" (10/13/2012)    Patient Active Problem List   Diagnosis Date Noted  . Urinary hesitancy 10/26/2018  . Night sweats 10/26/2018  . Other specified hypothyroidism 05/29/2018  . Lumbar stenosis 05/23/2017  . Anemia 10/02/2016  . Ecchymosis 09/23/2013  . PAD (peripheral artery disease) ([place]) 08/09/2013  . Aftercare following surgery of the circulatory system, [place] 02/01/2013  . Smoker 08/13/2012  . Other and unspecified alcohol dependence, unspecified drinking behavior 08/13/2012  . Screening for prostate cancer 08/13/2012  . Encounter for long-term (current) use of other medications 08/13/2012  . Atherosclerosis of native artery of extremity with intermittent claudication ([place])  08/21/2011  . ECZEMA 01/24/2010  . COUGH 01/24/2009  . AAA (abdominal aortic aneurysm) ([place]) 12/06/2008  . HYPERCHOLESTEROLEMIA 01/21/2008  . ERECTILE DYSFUNCTION, ORGANIC 01/21/2008  . Back pain 01/21/2008  . 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 Anxiety state 03/07/2007  . ERECTILE DYSFUNCTION 03/07/2007  . DEPRESSION 03/07/2007  . Essential hypertension 03/07/2007  . CAROTID ARTERY STENOSIS 03/07/2007  . HIP PAIN, RIGHT 03/07/2007  . COLONIC POLYPS, HX OF 03/07/2007  . ADENOIDECTOMY, HX OF 03/07/2007    Past Surgical History:  Procedure Laterality Date  . ABDOMINAL ANGIOGRAM N/A 12/04/2011   Procedure: ABDOMINAL ANGIOGRAM;  Surgeon: Sherren Mocha, MD;  Location: [facility] CATH LAB;  Service: Cardiovascular;  Laterality: N/A;  . ABDOMINAL AORTAGRAM N/A 10/13/2012   Procedure: ABDOMINAL Maxcine Ham;  Surgeon: Serafina Mitchell, MD;  Location: [facility] CATH LAB;  Service: Cardiovascular;  Laterality: N/A;  . ANGIOPLASTY / STENTING FEMORAL Left 10/13/2012  . ESOPHAGOGASTRODUODENOSCOPY    . FEMORAL ENDARTERECTOMY Left 01/23/12   Left Endarterectomy  with bovie patch Angioplasy  . gated spect wall motion stress cardiolite  02/10/2002  . HIP FRACTURE SURGERY Right 1990's  . INGUINAL HERNIA REPAIR Right    "years ago" (10/13/2012)  . LUMBAR LAMINECTOMY/DECOMPRESSION MICRODISCECTOMY N/A 05/23/2017   Procedure: L4-5 DECOMPRESSION;  Surgeon: Marybelle Killings, MD;  Location: [facility];  Service: Orthopedics;  Laterality: N/A;  . NOSE SURGERY    . TONSILLECTOMY     "I was  a chld" (10/13/2012)       Family History  Problem Relation Age of Onset  . Hypertension Mother   . Hypertension Father   . Hypertension Sister   . Hypertension Sister     Social History   Tobacco Use  . Smoking status: Former Smoker    Packs/day: 0.25    Years: 57.00    Pack years: 14.25    Types: Cigarettes  . Smokeless tobacco: Never Used  . Tobacco comment: pt states there is no hope for quitting  Substance Use Topics  . Alcohol use:  Not Currently  . Drug use: No    Home Medications Prior to Admission medications   Medication Sig Start Date End Date Taking? Authorizing Provider  amLODipine (NORVASC) 2.5 MG tablet Take 1 tablet (2.5 mg total) by mouth daily. 02/08/19  Yes Luetta Nutting, DO  cilostazol (PLETAL) 100 MG tablet Take 1 tablet (100 mg total) by mouth 2 (two) times daily before a meal. 07/12/19  Yes Serafina Mitchell, MD  clopidogrel (PLAVIX) 75 MG tablet TAKE 1 TABLET(75 MG) BY MOUTH DAILY Patient taking differently: Take 75 mg by mouth daily.  06/07/19  Yes Luetta Nutting, DO  diclofenac Sodium (VOLTAREN) 1 % GEL Apply 2 g topically 4 (four) times daily. 07/31/19  Yes Darr, Marguerita Beards, PA-C  levothyroxine (SYNTHROID) 75 MCG tablet Take 1qd (Plz sched visit with new provider) Patient taking differently: Take 75 mcg by mouth daily.  07/21/19  Yes Libby Maw, MD  tamsulosin (FLOMAX) 0.4 MG CAPS capsule Take 1 capsule (0.4 mg total) by mouth daily. 10/30/18  Yes Luetta Nutting, DO  ALPRAZolam (XANAX) 0.25 MG tablet TAKE 1 TABLET(0.25 MG) BY MOUTH TWICE DAILY AS NEEDED FOR ANXIETY Patient not taking: Reported on 08/09/2019 12/02/18   Luetta Nutting, DO  losartan (COZAAR) 100 MG tablet TAKE 1 TABLET(100 MG) BY MOUTH DAILY Patient not taking: Reported on 08/09/2019 06/07/19   Luetta Nutting, DO    Allergies    Other, Atorvastatin, Penicillins, Pioglitazone, and Vicodin [hydrocodone-acetaminophen]  Review of Systems   Review of Systems  All other systems reviewed and are negative.   Physical Exam Updated Vital Signs BP 131/66   Pulse 86   Temp 98.1 F (36.7 C) (Oral)   Resp (!) 23   Ht 5\' 9"  (1.753 m)   Wt 65.3 kg   SpO2 100%   BMI 21.27 kg/m   Physical Exam Vitals and nursing note reviewed.  Constitutional:      General: He is not in acute distress.    Appearance: He is well-developed. He is ill-appearing. He is not toxic-appearing or diaphoretic.  HENT:     Head: Normocephalic and atraumatic.      Right Ear: External ear normal.     Left Ear: External ear normal.     Mouth/Throat:     Mouth: Mucous membranes are moist.     Pharynx: Oropharyngeal exudate present.  Eyes:     Conjunctiva/sclera: Conjunctivae normal.     Pupils: Pupils are equal, round, and reactive to light.     Comments: Pale conjunctiva  Neck:     Trachea: Phonation normal.  Cardiovascular:     Rate and Rhythm: Normal rate and regular rhythm.     Heart sounds: Normal heart sounds.  Pulmonary:     Effort: Pulmonary effort is normal.     Breath sounds: Normal breath sounds.  Abdominal:     General: There is no distension.     Palpations:  Abdomen is soft.     Tenderness: There is no abdominal tenderness.  Musculoskeletal:        General: Normal range of motion.     Cervical back: Normal range of motion and neck supple.     Comments: Normal strength of arms and legs bilaterally.  Skin:    General: Skin is warm and dry.     Coloration: Skin is pale.     Findings: No erythema.  Neurological:     Mental Status: He is alert and oriented to person, place, and time.     Cranial Nerves: No cranial nerve deficit.     Sensory: No sensory deficit.     Motor: No abnormal muscle tone.     Coordination: Coordination normal.     Comments: No dysarthria or aphasia.  No asymmetry of strength  Psychiatric:        Mood and Affect: Mood normal.        Behavior: Behavior normal.        Thought Content: Thought content normal.        Judgment: Judgment normal.     ED Results / Procedures / Treatments   Labs (all labs ordered are listed, but only abnormal results are displayed) Labs Reviewed  COMPREHENSIVE METABOLIC PANEL - Abnormal; Notable for the following components:      Result Value   Glucose, Bld 118 (*)    BUN 31 (*)    Calcium 8.6 (*)    Total Protein 6.0 (*)    AST 129 (*)    ALT 164 (*)    All other components within normal limits  CBC WITH DIFFERENTIAL/PLATELET - Abnormal; Notable for the following  components:   RBC 2.79 (*)    Hemoglobin 4.8 (*)    HCT 20.4 (*)    MCV 73.1 (*)    MCH 17.2 (*)    MCHC 23.5 (*)    RDW 18.4 (*)    nRBC 1.6 (*)    All other components within normal limits  SARS CORONAVIRUS 2 (TAT 6-24 HRS)  POC OCCULT BLOOD, ED  TYPE AND SCREEN  PREPARE RBC (CROSSMATCH)    EKG None  Radiology DG Chest Port 1 View  Result Date: 08/09/2019 CLINICAL DATA:  Cough, progressive weakness, symptoms for 6 weeks EXAM: PORTABLE CHEST 1 VIEW COMPARISON:  12/17/2018 FINDINGS: Single frontal view of the chest demonstrates an unremarkable cardiac silhouette. There is right basilar consolidation and small right pleural effusion. No pneumothorax. Left chest is clear. No acute bony abnormalities. IMPRESSION: 1. Right basilar consolidation and small right pleural effusion. Findings could reflect bronchopneumonia. Followup PA and lateral chest X-ray is recommended in 3-4 weeks following trial of antibiotic therapy to ensure resolution. Electronically Signed   By: Randa Ngo M.D.   On: 08/09/2019 21:31    Procedures .Critical Care Performed by: Daleen Bo, MD Authorized by: Daleen Bo, MD   Critical care provider statement:    Critical care time (minutes):  45   Critical care start time:  08/09/2019 5:10 PM   Critical care end time:  08/09/2019 9:57 PM   Critical care time was exclusive of:  Separately billable procedures and treating other patients   Critical care was necessary to treat or prevent imminent or life-threatening deterioration of the following conditions:  Circulatory failure   Critical care was time spent personally by me on the following activities:  Blood draw for specimens, development of treatment plan with patient or surrogate, discussions with consultants, evaluation  of patient's response to treatment, examination of patient, obtaining history from patient or surrogate, ordering and performing treatments and interventions, ordering and review of  laboratory studies, pulse oximetry, re-evaluation of patient's condition, review of old charts and ordering and review of radiographic studies   (including critical care time)  Medications Ordered in ED Medications  0.9 %  sodium chloride infusion (has no administration in time range)    ED Course  I have reviewed the triage vital signs and the nursing notes.  Pertinent labs & imaging results that were available during my care of the patient were reviewed by me and considered in my medical decision making (see chart for details).  Clinical Course as of Aug 09 2155  Mon Aug 09, 2019  1945 Nursing drew blood but did not send lab orders.  Ordered now.   [EW]  2119 Patient trialed on nasal cannula oxygen at 2 L, saturations 88%, without respiratory distress.  Will titrate to try to keep oxygen saturation greater than 90%.   [EW]  2124 Normal  POC occult blood, ED Provider will collect [EW]  2124 Abnormal, hemoglobin low, hematocrit low, MCV low  CBC with Differential(!!) [EW]  2124 Normal except glucose high, BUN high, calcium low, total protein low, AST high, ALT high  Comprehensive metabolic panel(!) [EW]  [id number] No infiltrate or CHF, interpreted by me  DG Chest [facility] 1 View [EW]    Clinical Course User Index [EW] Daleen Bo, MD   MDM Rules/Calculators/A&P                       Patient Vitals for the past 24 hrs:  BP Temp Temp src Pulse Resp SpO2 Height Weight  08/09/19 2148 131/66 98.1 F (36.7 C) Oral 86 (!) 23 100 % -- --  08/09/19 2145 133/65 -- -- 86 (!) 23 100 % -- --  08/09/19 2133 136/61 98 F (36.7 C) Oral 80 20 -- -- --  08/09/19 2030 (!) 125/35 -- -- 91 (!) 22 100 % -- --  08/09/19 2015 123/73 -- -- 88 17 100 % -- --  08/09/19 2000 109/62 -- -- 92 (!) 21 100 % -- --  08/09/19 1945 (!) 133/49 -- -- 95 (!) 23 100 % -- --  08/09/19 1930 (!) 132/119 -- -- 90 (!) 22 100 % -- --  08/09/19 1915 124/61 -- -- 93 (!) 23 94 % -- --  08/09/19 1900 136/69 -- -- 94 (!) 25  100 % -- --  08/09/19 1845 138/68 -- -- 93 (!) 26 100 % -- --  08/09/19 1830 138/61 -- -- 94 (!) 21 100 % -- --  08/09/19 1743 140/75 98.1 F (36.7 C) Oral 96 18 100 % -- --  08/09/19 1710 -- -- -- -- -- -- 5\' 9"  (1.753 m) 65.3 kg    9:57 PM Reevaluation with update and discussion. After initial assessment and treatment, an updated evaluation reveals he is resting comfortably without further complaints findings discussed with patient and family members, all questions answered. Daleen Bo   Medical Decision Making: Patient presenting with weakness.  He has previously had significant anemia, and today hemoglobin is very low at 4.5.  No visible bleeding per report of family members and patient, stool heme-negative.  Incidental hypoxia likely related to severe anemia.  Oxygenation improved with nasal cannula oxygen at 4 L to greater than 90%.  Patient requires hospitalization for further treatment.  He has previously been treated with iron  infusion likely secondary to poor compliance versus intolerance of oral iron.  Patient has reported history of AAA, but it is not clearly documented in the chart.  His vascular doctor whom he sees for peripheral vascular disease, plans on ordering an outpatient ultrasound to evaluate the abdomen for aneurysm in the future.  Today the abdominal exam is benign.  I doubt intra-abdominal bleeding causing anemia.  He is overall hemodynamically stable, does not require intensive care at this time.  Ronnie Snyder was evaluated in Emergency Department on 08/09/2019 for the symptoms described in the history of present illness. He was evaluated in the context of the global COVID-19 pandemic, which necessitated consideration that the patient might be at risk for infection with the SARS-CoV-2 virus that causes COVID-19. Institutional protocols and algorithms that pertain to the evaluation of patients at risk for COVID-19 are in a state of rapid change based on information  released by regulatory bodies including the CDC and federal and state organizations. These policies and algorithms were followed during the patient's care in the ED.   CRITICAL CARE-yes Performed by: Daleen Bo   Nursing Notes Reviewed/ Care Coordinated Applicable Imaging Reviewed Interpretation of Laboratory Data incorporated into ED treatment  9:34 PM-Consult complete with hospitalist. Patient case explained and discussed.  He agrees to admit patient for further evaluation and treatment. Call ended at 9:55 PM  Plan: Admit   Final Clinical Impression(s) / ED Diagnoses Final diagnoses:  Anemia, unspecified type  Hypoxia    Rx / DC Orders ED Discharge Orders    None       Daleen Bo, MD 08/09/19 2157

## 2019-08-10 ENCOUNTER — Inpatient Hospital Stay (HOSPITAL_COMMUNITY): Payer: Medicare Other

## 2019-08-10 DIAGNOSIS — R29702 NIHSS score 2: Secondary | ICD-10-CM | POA: Diagnosis not present

## 2019-08-10 DIAGNOSIS — I714 Abdominal aortic aneurysm, without rupture: Secondary | ICD-10-CM

## 2019-08-10 DIAGNOSIS — R0602 Shortness of breath: Secondary | ICD-10-CM | POA: Diagnosis not present

## 2019-08-10 DIAGNOSIS — F17200 Nicotine dependence, unspecified, uncomplicated: Secondary | ICD-10-CM | POA: Diagnosis present

## 2019-08-10 DIAGNOSIS — R0902 Hypoxemia: Secondary | ICD-10-CM

## 2019-08-10 DIAGNOSIS — U071 COVID-19: Secondary | ICD-10-CM | POA: Diagnosis not present

## 2019-08-10 DIAGNOSIS — Z7902 Long term (current) use of antithrombotics/antiplatelets: Secondary | ICD-10-CM | POA: Diagnosis not present

## 2019-08-10 DIAGNOSIS — I6330 Cerebral infarction due to thrombosis of unspecified cerebral artery: Secondary | ICD-10-CM | POA: Diagnosis not present

## 2019-08-10 DIAGNOSIS — I340 Nonrheumatic mitral (valve) insufficiency: Secondary | ICD-10-CM | POA: Diagnosis not present

## 2019-08-10 DIAGNOSIS — N400 Enlarged prostate without lower urinary tract symptoms: Secondary | ICD-10-CM | POA: Diagnosis not present

## 2019-08-10 DIAGNOSIS — R109 Unspecified abdominal pain: Secondary | ICD-10-CM | POA: Diagnosis not present

## 2019-08-10 DIAGNOSIS — R7989 Other specified abnormal findings of blood chemistry: Secondary | ICD-10-CM

## 2019-08-10 DIAGNOSIS — R634 Abnormal weight loss: Secondary | ICD-10-CM

## 2019-08-10 DIAGNOSIS — I739 Peripheral vascular disease, unspecified: Secondary | ICD-10-CM | POA: Diagnosis present

## 2019-08-10 DIAGNOSIS — D649 Anemia, unspecified: Secondary | ICD-10-CM | POA: Diagnosis not present

## 2019-08-10 DIAGNOSIS — Z66 Do not resuscitate: Secondary | ICD-10-CM | POA: Diagnosis not present

## 2019-08-10 DIAGNOSIS — I350 Nonrheumatic aortic (valve) stenosis: Secondary | ICD-10-CM

## 2019-08-10 DIAGNOSIS — I639 Cerebral infarction, unspecified: Secondary | ICD-10-CM | POA: Diagnosis not present

## 2019-08-10 DIAGNOSIS — Z791 Long term (current) use of non-steroidal anti-inflammatories (NSAID): Secondary | ICD-10-CM | POA: Diagnosis not present

## 2019-08-10 DIAGNOSIS — D62 Acute posthemorrhagic anemia: Secondary | ICD-10-CM | POA: Diagnosis not present

## 2019-08-10 DIAGNOSIS — I951 Orthostatic hypotension: Secondary | ICD-10-CM | POA: Diagnosis not present

## 2019-08-10 DIAGNOSIS — Z85828 Personal history of other malignant neoplasm of skin: Secondary | ICD-10-CM | POA: Diagnosis not present

## 2019-08-10 DIAGNOSIS — D500 Iron deficiency anemia secondary to blood loss (chronic): Secondary | ICD-10-CM | POA: Diagnosis not present

## 2019-08-10 DIAGNOSIS — R29810 Facial weakness: Secondary | ICD-10-CM | POA: Diagnosis not present

## 2019-08-10 DIAGNOSIS — E039 Hypothyroidism, unspecified: Secondary | ICD-10-CM | POA: Diagnosis not present

## 2019-08-10 DIAGNOSIS — Z79899 Other long term (current) drug therapy: Secondary | ICD-10-CM | POA: Diagnosis not present

## 2019-08-10 DIAGNOSIS — E876 Hypokalemia: Secondary | ICD-10-CM | POA: Diagnosis present

## 2019-08-10 DIAGNOSIS — K5900 Constipation, unspecified: Secondary | ICD-10-CM | POA: Diagnosis present

## 2019-08-10 DIAGNOSIS — G9341 Metabolic encephalopathy: Secondary | ICD-10-CM | POA: Diagnosis not present

## 2019-08-10 DIAGNOSIS — J9601 Acute respiratory failure with hypoxia: Secondary | ICD-10-CM | POA: Diagnosis not present

## 2019-08-10 DIAGNOSIS — I63233 Cerebral infarction due to unspecified occlusion or stenosis of bilateral carotid arteries: Secondary | ICD-10-CM | POA: Diagnosis not present

## 2019-08-10 DIAGNOSIS — Z79890 Hormone replacement therapy (postmenopausal): Secondary | ICD-10-CM | POA: Diagnosis not present

## 2019-08-10 DIAGNOSIS — D508 Other iron deficiency anemias: Secondary | ICD-10-CM | POA: Diagnosis not present

## 2019-08-10 DIAGNOSIS — K760 Fatty (change of) liver, not elsewhere classified: Secondary | ICD-10-CM | POA: Diagnosis present

## 2019-08-10 DIAGNOSIS — D509 Iron deficiency anemia, unspecified: Secondary | ICD-10-CM | POA: Diagnosis present

## 2019-08-10 DIAGNOSIS — I10 Essential (primary) hypertension: Secondary | ICD-10-CM | POA: Diagnosis not present

## 2019-08-10 DIAGNOSIS — E7800 Pure hypercholesterolemia, unspecified: Secondary | ICD-10-CM | POA: Diagnosis present

## 2019-08-10 LAB — FERRITIN: Ferritin: 5 ng/mL — ABNORMAL LOW (ref 24–336)

## 2019-08-10 LAB — COMPREHENSIVE METABOLIC PANEL
ALT: 190 U/L — ABNORMAL HIGH (ref 0–44)
AST: 146 U/L — ABNORMAL HIGH (ref 15–41)
Albumin: 3.3 g/dL — ABNORMAL LOW (ref 3.5–5.0)
Alkaline Phosphatase: 78 U/L (ref 38–126)
Anion gap: 8 (ref 5–15)
BUN: 26 mg/dL — ABNORMAL HIGH (ref 8–23)
CO2: 30 mmol/L (ref 22–32)
Calcium: 8 mg/dL — ABNORMAL LOW (ref 8.9–10.3)
Chloride: 102 mmol/L (ref 98–111)
Creatinine, Ser: 0.67 mg/dL (ref 0.61–1.24)
GFR calc Af Amer: >60 mL/min (ref >60)
GFR calc non Af Amer: >60 mL/min (ref >60)
Glucose, Bld: 107 mg/dL — ABNORMAL HIGH (ref 70–99)
Potassium: 4.4 mmol/L (ref 3.5–5.1)
Sodium: 140 mmol/L (ref 135–145)
Total Bilirubin: 1.3 mg/dL — ABNORMAL HIGH (ref 0.3–1.2)
Total Protein: 5.5 g/dL — ABNORMAL LOW (ref 6.5–8.1)

## 2019-08-10 LAB — RETICULOCYTES
Immature Retic Fract: 25.6 % — ABNORMAL HIGH (ref 2.3–15.9)
RBC.: 3.22 MIL/uL — ABNORMAL LOW (ref 4.22–5.81)
Retic Count, Absolute: 47.5 10*3/uL (ref 19.0–186.0)
Retic Ct Pct: 1.6 % (ref 0.4–3.1)

## 2019-08-10 LAB — CBC WITH DIFFERENTIAL/PLATELET
Abs Immature Granulocytes: 0.3 10*3/uL — ABNORMAL HIGH (ref 0.00–0.07)
Basophils Absolute: 0 10*3/uL (ref 0.0–0.1)
Basophils Relative: 0 %
Eosinophils Absolute: 0.2 10*3/uL (ref 0.0–0.5)
Eosinophils Relative: 2 %
HCT: 25.1 % — ABNORMAL LOW (ref 39.0–52.0)
Hemoglobin: 6.6 g/dL — CL (ref 13.0–17.0)
Lymphocytes Relative: 13 %
Lymphs Abs: 1.1 10*3/uL (ref 0.7–4.0)
MCH: 20.4 pg — ABNORMAL LOW (ref 26.0–34.0)
MCHC: 26.3 g/dL — ABNORMAL LOW (ref 30.0–36.0)
MCV: 77.5 fL — ABNORMAL LOW (ref 80.0–100.0)
Metamyelocytes Relative: 4 %
Monocytes Absolute: 0.3 10*3/uL (ref 0.1–1.0)
Monocytes Relative: 4 %
Neutro Abs: 6.5 10*3/uL (ref 1.7–7.7)
Neutrophils Relative %: 77 %
Platelets: 216 10*3/uL (ref 150–400)
RBC: 3.24 MIL/uL — ABNORMAL LOW (ref 4.22–5.81)
RDW: 19.9 % — ABNORMAL HIGH (ref 11.5–15.5)
WBC: 8.4 10*3/uL (ref 4.0–10.5)
nRBC: 1 % — ABNORMAL HIGH (ref 0.0–0.2)
nRBC: 1 /100 WBC — ABNORMAL HIGH

## 2019-08-10 LAB — CBC
HCT: 24.8 % — ABNORMAL LOW (ref 39.0–52.0)
Hemoglobin: 6.5 g/dL — CL (ref 13.0–17.0)
MCH: 20.5 pg — ABNORMAL LOW (ref 26.0–34.0)
MCHC: 26.2 g/dL — ABNORMAL LOW (ref 30.0–36.0)
MCV: 78.2 fL — ABNORMAL LOW (ref 80.0–100.0)
Platelets: 177 10*3/uL (ref 150–400)
RBC: 3.17 MIL/uL — ABNORMAL LOW (ref 4.22–5.81)
RDW: 19.5 % — ABNORMAL HIGH (ref 11.5–15.5)
WBC: 8.8 10*3/uL (ref 4.0–10.5)
nRBC: 1.3 % — ABNORMAL HIGH (ref 0.0–0.2)

## 2019-08-10 LAB — PROCALCITONIN: Procalcitonin: <0.1 ng/mL

## 2019-08-10 LAB — HEMOGLOBIN AND HEMATOCRIT, BLOOD
HCT: 26.6 % — ABNORMAL LOW (ref 39.0–52.0)
Hemoglobin: 7.4 g/dL — ABNORMAL LOW (ref 13.0–17.0)

## 2019-08-10 LAB — ECHOCARDIOGRAM LIMITED
Height: 69 in
Weight: 2303.37 oz

## 2019-08-10 LAB — VITAMIN B12: Vitamin B-12: 624 pg/mL (ref 180–914)

## 2019-08-10 LAB — IRON AND TIBC
Iron: 246 ug/dL — ABNORMAL HIGH (ref 45–182)
Saturation Ratios: 48 % — ABNORMAL HIGH (ref 17.9–39.5)
TIBC: 510 ug/dL — ABNORMAL HIGH (ref 250–450)
UIBC: 264 ug/dL

## 2019-08-10 LAB — SARS CORONAVIRUS 2 (TAT 6-24 HRS): SARS Coronavirus 2: POSITIVE — AB

## 2019-08-10 LAB — D-DIMER, QUANTITATIVE: D-Dimer, Quant: 0.99 ug/mL-FEU — ABNORMAL HIGH (ref 0.00–0.50)

## 2019-08-10 LAB — C-REACTIVE PROTEIN: CRP: 0.6 mg/dL (ref <1.0)

## 2019-08-10 LAB — TROPONIN I (HIGH SENSITIVITY)
Troponin I (High Sensitivity): 40 ng/L — ABNORMAL HIGH (ref <18)
Troponin I (High Sensitivity): 38 ng/L — ABNORMAL HIGH (ref <18)

## 2019-08-10 LAB — BRAIN NATRIURETIC PEPTIDE: B Natriuretic Peptide: 827.2 pg/mL — ABNORMAL HIGH (ref 0.0–100.0)

## 2019-08-10 LAB — TSH: TSH: 2.158 u[IU]/mL (ref 0.350–4.500)

## 2019-08-10 LAB — FOLATE: Folate: 17.2 ng/mL (ref >5.9)

## 2019-08-10 LAB — AMMONIA: Ammonia: 57 umol/L — ABNORMAL HIGH (ref 9–35)

## 2019-08-10 LAB — RPR: RPR Ser Ql: NONREACTIVE

## 2019-08-10 LAB — PREPARE RBC (CROSSMATCH)

## 2019-08-10 MED ORDER — PANTOPRAZOLE SODIUM 40 MG IV SOLR
40.0000 mg | Freq: Two times a day (BID) | INTRAVENOUS | Status: DC
  Administered 2019-08-10: 40 mg via INTRAVENOUS
  Filled 2019-08-10: qty 40

## 2019-08-10 MED ORDER — FUROSEMIDE 10 MG/ML IJ SOLN
40.0000 mg | Freq: Once | INTRAMUSCULAR | Status: AC
  Administered 2019-08-10: 40 mg via INTRAVENOUS
  Filled 2019-08-10: qty 4

## 2019-08-10 MED ORDER — FUROSEMIDE 10 MG/ML IJ SOLN
40.0000 mg | Freq: Two times a day (BID) | INTRAMUSCULAR | Status: DC
  Administered 2019-08-10 – 2019-08-11 (×2): 40 mg via INTRAVENOUS
  Filled 2019-08-10 (×2): qty 4

## 2019-08-10 MED ORDER — SODIUM CHLORIDE 0.9 % IV SOLN
100.0000 mg | Freq: Every day | INTRAVENOUS | Status: AC
  Administered 2019-08-11 – 2019-08-14 (×4): 100 mg via INTRAVENOUS
  Filled 2019-08-10 (×4): qty 20

## 2019-08-10 MED ORDER — ACETAMINOPHEN 325 MG PO TABS
650.0000 mg | ORAL_TABLET | Freq: Once | ORAL | Status: AC
  Administered 2019-08-10: 650 mg via ORAL
  Filled 2019-08-10: qty 2

## 2019-08-10 MED ORDER — SODIUM CHLORIDE 0.9% IV SOLUTION: Freq: Once | INTRAVENOUS | Status: DC

## 2019-08-10 MED ORDER — IOHEXOL 300 MG/ML  SOLN
100.0000 mL | Freq: Once | INTRAMUSCULAR | Status: AC | PRN
  Administered 2019-08-10: 100 mL via INTRAVENOUS

## 2019-08-10 MED ORDER — IOHEXOL 9 MG/ML PO SOLN
ORAL | Status: AC
  Administered 2019-08-10: 19:00:00 500 mL
  Filled 2019-08-10: qty 500

## 2019-08-10 MED ORDER — LEVOFLOXACIN IN D5W 750 MG/150ML IV SOLN
750.0000 mg | INTRAVENOUS | Status: DC
  Administered 2019-08-10 – 2019-08-11 (×2): 750 mg via INTRAVENOUS
  Filled 2019-08-10 (×2): qty 150

## 2019-08-10 MED ORDER — LACTULOSE 10 GM/15ML PO SOLN
30.0000 g | Freq: Once | ORAL | Status: AC
  Administered 2019-08-10: 30 g via ORAL
  Filled 2019-08-10: qty 45

## 2019-08-10 MED ORDER — DIPHENHYDRAMINE HCL 50 MG/ML IJ SOLN
25.0000 mg | Freq: Once | INTRAMUSCULAR | Status: AC
  Administered 2019-08-10: 25 mg via INTRAVENOUS
  Filled 2019-08-10: qty 1

## 2019-08-10 MED ORDER — DEXAMETHASONE SODIUM PHOSPHATE 10 MG/ML IJ SOLN
6.0000 mg | INTRAMUSCULAR | Status: DC
  Administered 2019-08-10 – 2019-08-13 (×4): 6 mg via INTRAVENOUS
  Filled 2019-08-10: qty 0.6
  Filled 2019-08-10 (×3): qty 1

## 2019-08-10 MED ORDER — CLOPIDOGREL BISULFATE 75 MG PO TABS: 75.0000 mg | ORAL_TABLET | Freq: Every day | ORAL | Status: DC

## 2019-08-10 MED ORDER — SODIUM CHLORIDE 0.9 % IV SOLN
200.0000 mg | Freq: Once | INTRAVENOUS | Status: AC
  Administered 2019-08-10: 200 mg via INTRAVENOUS
  Filled 2019-08-10: qty 40

## 2019-08-10 MED ORDER — PANTOPRAZOLE SODIUM 40 MG PO TBEC
40.0000 mg | DELAYED_RELEASE_TABLET | Freq: Every day | ORAL | Status: DC
  Administered 2019-08-11 – 2019-08-12 (×2): 40 mg via ORAL
  Filled 2019-08-10 (×2): qty 1

## 2019-08-10 MED ORDER — LACTULOSE 10 GM/15ML PO SOLN
20.0000 g | Freq: Two times a day (BID) | ORAL | Status: DC
  Administered 2019-08-10 – 2019-08-14 (×8): 20 g via ORAL
  Filled 2019-08-10 (×8): qty 30

## 2019-08-10 NOTE — Progress Notes (Signed)
  Echocardiogram 2D Echocardiogram has been performed.  Ronnie Snyder 08/10/2019, 10:51 AM

## 2019-08-10 NOTE — Progress Notes (Signed)
One unit of blood completed during shift. BP 104/53,      HR 88, Temp 97.9, rr 18, oxygen sat 99% on 7L HFNC

## 2019-08-10 NOTE — Progress Notes (Signed)
New Admission Note:  Arrival Method: Via stretcher from ED to 78m09 Mental Orientation: Alert & Oriented x2 (self & place) Telemetry: CCMD verifed 76m13 Assessment: Completed Skin: Refer to flowsheet IV: Right AC Pain: 0/10 Safety Measures: Safety Fall Prevention Plan discussed with patient. Admission: Completed 5 [place] Orientation: Patient has been orientated to the room, unit and the staff.  Orders have been reviewed and are being implemented. Will continue to monitor the patient. Call light has been placed within reach and bed alarm has been activated.   Vassie Moselle, RN  Phone Number: [phone]

## 2019-08-10 NOTE — Progress Notes (Signed)
Pt Covid positive. On call provider, Sharlet Salina NP, made aware. Awaiting orders.

## 2019-08-10 NOTE — Progress Notes (Signed)
Pt a Ronnie Snyder [id number] Admission Data: 08/10/2019 3:52 PM Attending Provider: Jonetta Osgood, MD  [id number], Einar Pheasant, DO Consults/ Treatment Team: Treatment Team:  Irene Shipper, MD  Cecil Cranker is a 79 y.o. male patient admitted from ED awake, alert  & orientated  X 3,  DNR, VSS - Blood pressure (!) 104/55, pulse 84, temperature 98.6 F (37 C), temperature source Axillary, resp. rate 20, height 5\' 9"  (1.753 m), weight 65.3 kg, SpO2 99 %., O2   7L nasal cannular, no c/o shortness of breath, no c/o chest pain, no distress noted. Tele # 13 placed and pt is currently running:normal sinus rhythm.   IV site WDL:  hand left, condition patent and no redness with a transparent dsg that's clean dry and intact.  Allergies:   Allergies  Allergen Reactions  . Other     UNSPECIFIED REACTION  "ALL PAINS/NARCOTIC MEDS-CANNOT TOLERATE WELL" > per Med History prior to 05/23/17  . Atorvastatin Nausea Only  . Penicillins Rash    Has patient had a PCN reaction causing immediate rash, facial/tongue/throat swelling, SOB or lightheadedness with hypotension: Unknown Has patient had a PCN reaction causing severe rash involving mucus membranes or skin necrosis: No Has patient had a PCN reaction that required hospitalization: No Has patient had a PCN reaction occurring within the last 10 years: No If all of the above answers are "NO", then may proceed with Cephalosporin use.   . Pioglitazone Nausea Only  . Vicodin [Hydrocodone-Acetaminophen] Nausea Only     Past Medical History:  Diagnosis Date  . Abdominal aortic aneurysm (HCC)    3.5 cm 10/2016 L-spine CT (previously 3.0 cm by U/S 02/25/11)  . Anxiety   . Arthritis    "in my feet" (10/13/2012)  . GERD (gastroesophageal reflux disease)   . H/O hiatal hernia   . Hepatic steatosis   . High cholesterol    "at one time; it's fine now" (10/13/2012)  . HTN (hypertension)   . Hx of colonic polyps   . Hypothyroidism   . PAD  (peripheral artery disease) ([place])   . Skin cancer    "burned them off my arm and such" (10/13/2012)      Pt orientation to unit, room and routine. Information packet given to patient/family and safety video watched.  Admission INP armband ID verified with patient/family, and in place. SR up x 2, fall risk assessment complete with Patient and family verbalizing understanding of risks associated with falls. Pt verbalizes an understanding of how to use the call bell and to call for help before getting out of bed.  Skin, clean-dry- intact without evidence of bruising, or skin tears.   No evidence of skin break down noted on exam.     Will cont to monitor and assist as needed.  Dorlis Judice Shelda Pal, RN 08/10/2019 3:52 PM

## 2019-08-10 NOTE — Plan of Care (Signed)

## 2019-08-10 NOTE — Progress Notes (Signed)
Pharmacy Antibiotic Note  Ronnie Snyder is a 79 y.o. male admitted on 08/09/2019 with pneumonia.  Pharmacy has been consulted for Levaquin dosing.  Plan: Levaquin 750mg  IV Q24H.  Height: 5\' 9"  (175.3 cm) Weight: 143 lb 15.4 oz (65.3 kg) IBW/kg (Calculated) : 70.7  Temp (24hrs), Avg:98.3 F (36.8 C), Min:98 F (36.7 C), Max:98.9 F (37.2 C)  Recent Labs  Lab 08/09/19 1917 08/10/19 0627  WBC 4.9 8.4  CREATININE 0.90 0.67    Estimated Creatinine Clearance: 71.4 mL/min (by C-G formula based on SCr of 0.67 mg/dL).    Allergies  Allergen Reactions  . Other     UNSPECIFIED REACTION  "ALL PAINS/NARCOTIC MEDS-CANNOT TOLERATE WELL" > per Med History prior to 05/23/17  . Atorvastatin Nausea Only  . Penicillins Rash    Has patient had a PCN reaction causing immediate rash, facial/tongue/throat swelling, SOB or lightheadedness with hypotension: Unknown Has patient had a PCN reaction causing severe rash involving mucus membranes or skin necrosis: No Has patient had a PCN reaction that required hospitalization: No Has patient had a PCN reaction occurring within the last 10 years: No If all of the above answers are "NO", then may proceed with Cephalosporin use.   . Pioglitazone Nausea Only  . Vicodin [Hydrocodone-Acetaminophen] Nausea Only     Thank you for allowing pharmacy to be a part of this patient's care.  Wynona Neat, PharmD, BCPS  08/10/2019 7:09 AM

## 2019-08-10 NOTE — Progress Notes (Deleted)
PROGRESS NOTE                                                                                                                                                                                                             Patient Demographics:    Ronnie Snyder, is a 79 y.o. male, DOB - Mar 29, 1941, [id number]  Outpatient Primary MD for the patient is Luetta Nutting, DO   Admit date - 08/09/2019   LOS - 0  No chief complaint on file.      Brief Narrative: Patient is a 79 y.o. male with PMHx of PAD, AAA hypothyroidism, HTN-who presented with weakness and confusion-was found to have a hemoglobin of 4.8, elevated liver enzymes-and acute hypoxic respiratory failure requiring oxygen supplementation.  He received 2 units of PRBC-and was subsequently admitted to the hospitalist service.  Subsequently his Covid PCR came back positive.  See below for further details.  Significant Events: 3/22>> admit for severe anemia likely from recent GI bleeding, hypoxemia-Covid positive and confusion.  COVID-19 medications: Steroids: 3/23>> Remdesivir: 3/23>>  Antibiotics: Levofloxacin: 3/23>>  Microbiology data: 3/22>> Covid positive  DVT prophylaxis: SCDs  Procedures: None  Consults: Gastroenterology   Subjective:    Faiz Fambrough today remains lethargic-and was sleeping when I walked in.  However he awoke-able to tell me his name-he is age-and able to tell me that he has had intermittent black stools over the past few weeks-last being approximately a week back.  He denies any shortness of breath at rest.  However he is still on NRB.   Assessment  & Plan :   Acute Hypoxic Resp Failure: Not sure if this is Covid pneumonia or bacterial pneumonia or decompensated diastolic heart failure-his CRP is normal, BNP is elevated.  Procalcitonin is pending.    At this time-prudent to continue steroids/remdesivir/levofloxacin-we will  start IV Lasix and obtain an echocardiogram.  Have asked nurse to see if we can titrate this gentleman of NRB.  Awaiting transfer to Covid unit-5 W.  Fever: afebrile  O2 requirements:  SpO2: 100 % O2 Flow Rate (L/min): 15 L/min   COVID-19 Labs: Recent Labs    08/10/19 0627 08/10/19 0747  DDIMER  --  0.99*  FERRITIN 5*  --   CRP  --  0.6       Component Value Date/Time   BNP 827.2 (  H) 08/10/2019 [id number]    No results for input(s): PROCALCITON in the last 168 hours.  Lab Results  Component Value Date   SARSCOV2NAA POSITIVE (A) 08/09/2019     Prone/Incentive Spirometry: encouraged patient to lie prone for 3-4 hours at a time for a total of 16 hours a day, and to encourage incentive spirometry use 3-4/hour.  DVT Prophylaxis  :  SCDs   Transaminitis: Denies any abdominal pain-could be from Covid-check acute hepatitis serology.  Recent lower GI bleeding with acute blood loss anemia: Acknowledges melanotic stools last week-is microcytic-and iron deficient.  Has received 2 units of PRBC transfusion so far-repeat CBC this morning-if hemoglobin still less than 7-we will go and transfuse 1 additional unit.  FOBT on admission was negative-so do not think patient is actively bleeding.  [facility] GI.  Explained to spouse that given severity of hypoxemia-and no active bleeding-May need to wait before performing endoscopy procedures.  Acute metabolic encephalopathy: Suspect secondary to hypoxemia-possible Covid/bacterial pneumonia.  Lethargic but relatively awake and alert this morning.  MRI brain ordered by admitting MD-pending.  HTN: Blood pressure reasonably well controlled-continue losartan and amlodipine.  PAD: Stable-given recent history of GI bleeding and severity of anemia-no longer on Plavix-remains on Pletal.  Hypothyroidism: Continue with Synthroid  BPH: Continue Flomax  ABG:    Component Value Date/Time   TCO2 27 10/13/2012 1041    Vent Settings: N/A    Condition - Extremely Guarded  Family Communication  :  Spouse updated over the phone  Code Status :  DNR-confirmed with spouse  Diet :  Diet Order            Diet clear liquid Room service appropriate? Yes; Fluid consistency: Thin  Diet effective now               Disposition Plan  :  Remain hospitalized-may require SNF on discharge.  Barriers to discharge: Hypoxia requiring O2 supplementation/complete 5 days of IV Remdesivir  Antimicorbials  :    Anti-infectives (From admission, onward)   Start     Dose/Rate Route Frequency Ordered Stop   08/11/19 1000  remdesivir 100 mg in sodium chloride 0.9 % 100 mL IVPB     100 mg 200 mL/hr over 30 Minutes Intravenous Daily 08/10/19 0703 08/15/19 0959   08/10/19 0800  levofloxacin (LEVAQUIN) IVPB 750 mg     750 mg 100 mL/hr over 90 Minutes Intravenous Every 24 hours 08/10/19 0711     08/10/19 0715  remdesivir 200 mg in sodium chloride 0.9% 250 mL IVPB     200 mg 580 mL/hr over 30 Minutes Intravenous Once 08/10/19 0703 08/10/19 0859      Inpatient Medications  Scheduled Meds: . amLODipine  2.5 mg Oral Daily  . cilostazol  100 mg Oral BID AC  . dexamethasone (DECADRON) injection  6 mg Intravenous Q24H  . levothyroxine  75 mcg Oral Q0600  . losartan  100 mg Oral Daily  . pantoprazole (PROTONIX) IV  40 mg Intravenous Q12H  . tamsulosin  0.4 mg Oral Daily   Continuous Infusions: . sodium chloride    . levofloxacin (LEVAQUIN) IV 750 mg (08/10/19 1120)  . [START ON 08/11/2019] remdesivir 100 mg in NS 100 mL     PRN Meds:.acetaminophen **OR** acetaminophen, ondansetron **OR** ondansetron (ZOFRAN) IV   Time Spent in minutes  35   See all Orders from today for further details   Oren Binet M.D on 08/10/2019 at 11:25 AM  To page go  to [email or web address] - use universal password  [facility] -  Office  [phone]    Objective:   Vitals:   08/10/19 0345 08/10/19 0607 08/10/19 0608 08/10/19 0714  BP: 136/68   132/65 138/72  Pulse: 87  87 91  Resp: 18   (!) 22  Temp: 98.2 F (36.8 C)   98.5 F (36.9 C)  TempSrc: Oral   Axillary  SpO2: 100% 100% 100% 100%  Weight:      Height:        Wt Readings from Last 3 Encounters:  08/09/19 65.3 kg  07/12/19 65.3 kg  10/26/18 65.4 kg     Intake/Output Summary (Last 24 hours) at 08/10/2019 1125 Last data filed at 08/10/2019 0601 Gross per 24 hour  Intake 0 ml  Output 300 ml  Net -300 ml     Physical Exam Gen Exam: Sleeping/lethargic but awakes-answers most of my questions appropriately. HEENT:atraumatic, normocephalic Chest: B/L clear to auscultation anteriorly CVS:S1S2 regular Abdomen:soft non tender, non distended Extremities:no edema Neurology: Non focal Skin: no rash   Data Review:    CBC Recent Labs  Lab 08/09/19 1917 08/10/19 0627  WBC 4.9 8.4  HGB 4.8* 6.6*  HCT 20.4* 25.1*  PLT 285 216  MCV 73.1* 77.5*  MCH 17.2* 20.4*  MCHC 23.5* 26.3*  RDW 18.4* 19.9*  LYMPHSABS 0.9 1.1  MONOABS 0.8 0.3  EOSABS 0.0 0.2  BASOSABS 0.0 0.0    Chemistries  Recent Labs  Lab 08/09/19 1917 08/10/19 0627  NA 138 140  K 4.7 4.4  CL 98 102  CO2 29 30  GLUCOSE 118* 107*  BUN 31* 26*  CREATININE 0.90 0.67  CALCIUM 8.6* 8.0*  AST 129* 146*  ALT 164* 190*  ALKPHOS 82 78  BILITOT 0.7 1.3*   ------------------------------------------------------------------------------------------------------------------ No results for input(s): CHOL, HDL, LDLCALC, TRIG, CHOLHDL, LDLDIRECT in the last 72 hours.  Lab Results  Component Value Date   HGBA1C 5.3 01/22/2018   ------------------------------------------------------------------------------------------------------------------ Recent Labs    08/10/19 0627  TSH 2.158   ------------------------------------------------------------------------------------------------------------------ Recent Labs    08/10/19 0627  VITAMINB12 624  FOLATE 17.2  FERRITIN 5*  TIBC 510*  IRON 246*    RETICCTPCT 1.6    Coagulation profile No results for input(s): INR, PROTIME in the last 168 hours.  Recent Labs    08/10/19 0747  DDIMER 0.99*    Cardiac Enzymes No results for input(s): CKMB, TROPONINI, MYOGLOBIN in the last 168 hours.  Invalid input(s): CK ------------------------------------------------------------------------------------------------------------------    Component Value Date/Time   BNP 827.2 (H) 08/10/2019 [id number]    Micro Results Recent Results (from the past 240 hour(s))  SARS CORONAVIRUS 2 (TAT 6-24 HRS) Nasopharyngeal Nasopharyngeal Swab     Status: Abnormal   Collection Time: 08/09/19  9:39 PM   Specimen: Nasopharyngeal Swab  Result Value Ref Range Status   SARS Coronavirus 2 POSITIVE (A) NEGATIVE Final    Comment: RESULT CALLED TO, READ BACK BY AND VERIFIED WITH: RN JEQUATA WOODY AT [place] 08/10/2019 (NOTE) SARS-CoV-2 target nucleic acids are DETECTED. The SARS-CoV-2 RNA is generally detectable in upper and lower respiratory specimens during the acute phase of infection. Positive results are indicative of the presence of SARS-CoV-2 RNA. Clinical correlation with patient history and other diagnostic information is  necessary to determine patient infection status. Positive results do not rule out bacterial infection or co-infection with other viruses.  The expected result is Negative. Fact Sheet for Patients: SugarRoll.be Fact Sheet for Healthcare  Providers: https://www.woods-mathews.com/ This test is not yet approved or cleared by the [place] and  has been authorized for detection and/or diagnosis of SARS-CoV-2 by FDA under an Emergency Use Authorization (EUA). This EUA will remain  in effect (meaning this t est can be used) for the duration of the COVID-19 declaration under Section 564(b)(1) of the Act, 21 U.S.C. section 360bbb-3(b)(1), unless the authorization is  terminated or revoked sooner. Performed at [facility], [place] [address], [place], [place] [address]     Radiology Reports DG Chest [place] 1 View  Result Date: 08/10/2019 CLINICAL DATA:  Shortness of breath. EXAM: PORTABLE CHEST 1 VIEW COMPARISON:  08/09/2019 FINDINGS: The patient is rotated to the right with grossly unchanged cardiomediastinal silhouette. Aortic atherosclerosis is noted. There is increasing opacity in the right lung base extending into the right midlung likely reflecting a veiling pleural effusion and airspace consolidation. New mild opacity is present in the left lung base. No pneumothorax is identified. IMPRESSION: 1. Increasing right pleural effusion and right lung consolidation concerning for pneumonia. 2. New mild left basilar atelectasis or pneumonia. Electronically Signed   By: Logan Bores M.D.   On: 08/10/2019 09:40   DG Chest Port 1 View  Result Date: 08/09/2019 CLINICAL DATA:  Cough, progressive weakness, symptoms for 6 weeks EXAM: PORTABLE CHEST 1 VIEW COMPARISON:  12/17/2018 FINDINGS: Single frontal view of the chest demonstrates an unremarkable cardiac silhouette. There is right basilar consolidation and small right pleural effusion. No pneumothorax. Left chest is clear. No acute bony abnormalities. IMPRESSION: 1. Right basilar consolidation and small right pleural effusion. Findings could reflect bronchopneumonia. Followup PA and lateral chest X-ray is recommended in 3-4 weeks following trial of antibiotic therapy to ensure resolution. Electronically Signed   By: Randa Ngo M.D.   On: 08/09/2019 21:31   VAS [place] ABI WITH/WO TBI  Result Date: 07/12/2019 LOWER EXTREMITY DOPPLER STUDY Indications: Claudication, and peripheral artery disease. High Risk Factors: Hypertension, current smoker.  Vascular Interventions: Left Iliofemoral endarterectomy 02/02/2012. Left femoral                         to popliteal atherectomy and stent 10/13/2012. Comparison Study:  Decreased right ABI since prior exam of 03/24/2017 Performing Technologist: Alvia Grove RVT  Examination Guidelines: A complete evaluation includes at minimum, Doppler waveform signals and systolic blood pressure reading at the level of bilateral brachial, anterior tibial, and posterior tibial arteries, when vessel segments are accessible. Bilateral testing is considered an integral part of a complete examination. Photoelectric Plethysmograph (PPG) waveforms and toe systolic pressure readings are included as required and additional duplex testing as needed. Limited examinations for reoccurring indications may be performed as noted.  ABI Findings: +---------+------------------+-----+----------+--------+ Right    Rt Pressure (mmHg)IndexWaveform  Comment  +---------+------------------+-----+----------+--------+ Brachial 165                                       +---------+------------------+-----+----------+--------+ PTA      97                0.59 monophasic         +---------+------------------+-----+----------+--------+ DP       88                0.53 monophasic         +---------+------------------+-----+----------+--------+ Lia Foyer  0.42 Abnormal           +---------+------------------+-----+----------+--------+ +---------+------------------+-----+---------+-------+ Left     Lt Pressure (mmHg)IndexWaveform Comment +---------+------------------+-----+---------+-------+ Brachial 162                                     +---------+------------------+-----+---------+-------+ PTA      158               0.96 triphasic        +---------+------------------+-----+---------+-------+ DP       175               1.06 biphasic         +---------+------------------+-----+---------+-------+ Great Toe75                0.45 Abnormal         +---------+------------------+-----+---------+-------+ +-------+-----------+-----------+------------+------------+  ABI/TBIToday's ABIToday's TBIPrevious ABIPrevious TBI +-------+-----------+-----------+------------+------------+ Right  0.59       0.42       0.87        0.74         +-------+-----------+-----------+------------+------------+ Left   1.06       0.45       1.01        1.02         +-------+-----------+-----------+------------+------------+  Summary: Right: Resting right ankle-brachial index indicates moderate right lower extremity arterial disease. The right toe-brachial index is abnormal. Left: Resting left ankle-brachial index is within normal range. No evidence of significant left lower extremity arterial disease. The left toe-brachial index is abnormal.  *See table(s) above for measurements and observations.  Electronically signed by Harold Barban MD on 07/12/2019 at 11:57:41 AM.    Final    VAS [place] LOWER EXTREMITY BYPASS GRAFT DUPL  Result Date: 07/12/2019 LOWER EXTREMITY ARTERIAL DUPLEX STUDY Indications: Claudication, and peripheral artery disease. High Risk Factors: Hypertension, current smoker.  Vascular Interventions: Left Iliofemoral endarterectomy 02/02/2012. Left femoral                         to popliteal atherectomy and stent 10/13/2012. Current ABI:            Right ABI 0.59 Left 1.03 Performing Technologist: Alvia Grove RVT  Examination Guidelines: A complete evaluation includes B-mode imaging, spectral Doppler, color Doppler, and power Doppler as needed of all accessible portions of each vessel. Bilateral testing is considered an integral part of a complete examination. Limited examinations for reoccurring indications may be performed as noted.  +----------+--------+-----+--------+--------+--------+ LEFT      PSV cm/sRatioStenosisWaveformComments +----------+--------+-----+--------+--------+--------+ EIA [id number]                  biphasic         +----------+--------+-----+--------+--------+--------+ Left Iliofemoral segment measures 2.31 cn by 2.68.  Left  Stent(s): +---------------+--------+--------------+--------+-----------------+ Prox to Stent  12                    biphasicdampened          +---------------+--------+--------------+--------+-----------------+ Proximal Stent 54 / 1741-49% stenosisbiphasicabnormal waveform +---------------+--------+--------------+--------+-----------------+ Mid Stent      110                   biphasic                  +---------------+--------+--------------+--------+-----------------+ Distal Stent   57  biphasiccalcific          +---------------+--------+--------------+--------+-----------------+ Distal to Stent86                    biphasic                  +---------------+--------+--------------+--------+-----------------+ Calcific plaque noted throughout the femoral vein   Summary: Left: Patent stent with increased velocity in the proximal segment (1-49%). Calcific plaque prevents thorough visualization.  See table(s) above for measurements and observations. Electronically signed by Harold Barban MD on 07/12/2019 at 11:58:18 AM.    Final

## 2019-08-10 NOTE — Progress Notes (Signed)
PROGRESS NOTE                                                                                                                                                                                                             Patient Demographics:    Ronnie Snyder, is a 79 y.o. male, DOB - 27-Aug-1940, [id number]  Outpatient Primary MD for the patient is Ronnie Nutting, DO   Admit date - 08/09/2019   LOS - 0  No chief complaint on file.      Brief Narrative: Patient is a 79 y.o. male with PMHx of PAD, AAA hypothyroidism, HTN-who presented with weakness and confusion-was found to have a hemoglobin of 4.8, elevated liver enzymes-and acute hypoxic respiratory failure requiring oxygen supplementation.  He received 2 units of PRBC-and was subsequently admitted to the hospitalist service.  Subsequently his Covid PCR came back positive.  See below for further details.  Significant Events: 3/22>> admit for severe anemia likely from recent GI bleeding, hypoxemia-Covid positive and confusion.  COVID-19 medications: Steroids: 3/23>> Remdesivir: 3/23>>  Antibiotics: Levofloxacin: 3/23>>  Microbiology data: 3/22>> Covid positive  DVT prophylaxis: SCDs  Procedures: None  Consults: Gastroenterology   Subjective:    Ronnie Snyder today remains lethargic-and was sleeping when I walked in.  However he awoke-able to tell me his name-he is age-and able to tell me that he has had intermittent black stools over the past few weeks-last being approximately a week back.  He denies any shortness of breath at rest.  However he is still on NRB.   Assessment  & Plan :   Acute Hypoxic Resp Failure: Not sure if this is Covid pneumonia or bacterial pneumonia or decompensated diastolic heart failure-his CRP is normal, BNP is elevated.  Procalcitonin is pending.    At this time-prudent to continue steroids/remdesivir/levofloxacin-we will  start IV Lasix and obtain an echocardiogram.  Have asked nurse to see if we can titrate this gentleman of NRB.  Awaiting transfer to Covid unit-5 W.  Fever: afebrile  O2 requirements:  SpO2: 95 % O2 Flow Rate (L/min): 10 L/min   COVID-19 Labs: Recent Labs    08/10/19 0627 08/10/19 0747  DDIMER  --  0.99*  FERRITIN 5*  --   CRP  --  0.6       Component Value Date/Time   BNP 827.2 (  H) 08/10/2019 0627    Recent Labs  Lab 08/10/19 0747  PROCALCITON <0.10    Lab Results  Component Value Date   SARSCOV2NAA POSITIVE (A) 08/09/2019     Prone/Incentive Spirometry: encouraged patient to lie prone for 3-4 hours at a time for a total of 16 hours a day, and to encourage incentive spirometry use 3-4/hour.  DVT Prophylaxis  :  SCDs   Transaminitis: Denies any abdominal pain-could be from Covid-check acute hepatitis serology.  Recent lower GI bleeding with acute blood loss anemia: Acknowledges melanotic stools last week-is microcytic-and iron deficient.  Has received 2 units of PRBC transfusion so far-repeat CBC this morning-if hemoglobin still less than 7-we will go and transfuse 1 additional unit.  FOBT on admission was negative-so do not think patient is actively bleeding.  Starting [facility] consulted GI.  Explained to spouse that given severity of hypoxemia-and no active bleeding-May need to wait before performing endoscopy procedures.  Acute metabolic encephalopathy: Suspect secondary to hypoxemia-possible Covid/bacterial pneumonia.  Lethargic but relatively awake and alert this morning.  MRI brain ordered by admitting MD-pending.  HTN: Blood pressure reasonably well controlled-continue losartan and amlodipine.  PAD: Stable-given recent history of GI bleeding and severity of anemia-no longer on Plavix-remains on Pletal.  Hypothyroidism: Continue with Synthroid  BPH: Continue Flomax  Addendum: Spoke to GI PA-C-who reported that the patient was complaining of difficulty  urination when she saw her.  Postvoid residual urine completed by RN-patient was only able to urinate 150 cc-and had more than 500 cc still left in the bladder.  We will go and place a Foley catheter.  ABG:    Component Value Date/Time   TCO2 27 10/13/2012 1041    Vent Settings: N/A   Condition - Extremely Guarded  Family Communication  :  Spouse updated over the phone  Code Status :  DNR-confirmed with spouse  Diet :  Diet Order            Diet clear liquid Room service appropriate? Yes; Fluid consistency: Thin  Diet effective now               Disposition Plan  :  Remain hospitalized-may require SNF on discharge.  Barriers to discharge: Hypoxia requiring O2 supplementation/complete 5 days of IV Remdesivir  Antimicorbials  :    Anti-infectives (From admission, onward)   Start     Dose/Rate Route Frequency Ordered Stop   08/11/19 1000  remdesivir 100 mg in sodium chloride 0.9 % 100 mL IVPB     100 mg 200 mL/hr over 30 Minutes Intravenous Daily 08/10/19 0703 08/15/19 0959   08/10/19 0800  levofloxacin (LEVAQUIN) IVPB 750 mg     750 mg 100 mL/hr over 90 Minutes Intravenous Every 24 hours 08/10/19 0711     08/10/19 0715  remdesivir 200 mg in sodium chloride 0.9% 250 mL IVPB     200 mg 580 mL/hr over 30 Minutes Intravenous Once 08/10/19 0703 08/10/19 0859      Inpatient Medications  Scheduled Meds: . sodium chloride   Intravenous Once  . acetaminophen  650 mg Oral Once  . amLODipine  2.5 mg Oral Daily  . cilostazol  100 mg Oral BID AC  . dexamethasone (DECADRON) injection  6 mg Intravenous Q24H  . diphenhydrAMINE  25 mg Intravenous Once  . furosemide  40 mg Intravenous BID  . levothyroxine  75 mcg Oral Q0600  . losartan  100 mg Oral Daily  . pantoprazole (PROTONIX) IV  40 mg  Intravenous Q12H  . tamsulosin  0.4 mg Oral Daily   Continuous Infusions: . sodium chloride    . levofloxacin (LEVAQUIN) IV 750 mg (08/10/19 1120)  . [START ON 08/11/2019] remdesivir 100  mg in NS 100 mL     PRN Meds:.acetaminophen **OR** acetaminophen, ondansetron **OR** ondansetron (ZOFRAN) IV   Time Spent in minutes  35   See all Orders from today for further details   Ronnie Snyder M.D on 08/10/2019 at 2:30 PM  To page go to [email or web address] - use universal password  [facility] -  Office  [phone]    Objective:   Vitals:   08/10/19 0607 08/10/19 0608 08/10/19 0714 08/10/19 1200  BP:  132/65 138/72   Pulse:  87 91   Resp:   (!) 22   Temp:   98.5 F (36.9 C)   TempSrc:   Axillary   SpO2: 100% 100% 100% 95%  Weight:      Height:        Wt Readings from Last 3 Encounters:  08/09/19 65.3 kg  07/12/19 65.3 kg  10/26/18 65.4 kg     Intake/Output Summary (Last 24 hours) at 08/10/2019 1430 Last data filed at 08/10/2019 1400 Gross per 24 hour  Intake 0 ml  Output 800 ml  Net -800 ml     Physical Exam Gen Exam: Sleeping/lethargic but awakes-answers most of my questions appropriately. HEENT:atraumatic, normocephalic Chest: B/L clear to auscultation anteriorly CVS:S1S2 regular Abdomen:soft non tender, non distended Extremities:no edema Neurology: Non focal Skin: no rash   Data Review:    CBC Recent Labs  Lab 08/09/19 1917 08/10/19 0627 08/10/19 1117  WBC 4.9 8.4 8.8  HGB 4.8* 6.6* 6.5*  HCT 20.4* 25.1* 24.8*  PLT 285 216 177  MCV 73.1* 77.5* 78.2*  MCH 17.2* 20.4* 20.5*  MCHC 23.5* 26.3* 26.2*  RDW 18.4* 19.9* 19.5*  LYMPHSABS 0.9 1.1  --   MONOABS 0.8 0.3  --   EOSABS 0.0 0.2  --   BASOSABS 0.0 0.0  --     Chemistries  Recent Labs  Lab 08/09/19 1917 08/10/19 0627  NA 138 140  K 4.7 4.4  CL 98 102  CO2 29 30  GLUCOSE 118* 107*  BUN 31* 26*  CREATININE 0.90 0.67  CALCIUM 8.6* 8.0*  AST 129* 146*  ALT 164* 190*  ALKPHOS 82 78  BILITOT 0.7 1.3*   ------------------------------------------------------------------------------------------------------------------ No results for input(s): CHOL, HDL, LDLCALC,  TRIG, CHOLHDL, LDLDIRECT in the last 72 hours.  Lab Results  Component Value Date   HGBA1C 5.3 01/22/2018   ------------------------------------------------------------------------------------------------------------------ Recent Labs    08/10/19 0627  TSH 2.158   ------------------------------------------------------------------------------------------------------------------ Recent Labs    08/10/19 0627  VITAMINB12 624  FOLATE 17.2  FERRITIN 5*  TIBC 510*  IRON 246*  RETICCTPCT 1.6    Coagulation profile No results for input(s): INR, PROTIME in the last 168 hours.  Recent Labs    08/10/19 0747  DDIMER 0.99*    Cardiac Enzymes No results for input(s): CKMB, TROPONINI, MYOGLOBIN in the last 168 hours.  Invalid input(s): CK ------------------------------------------------------------------------------------------------------------------    Component Value Date/Time   BNP 827.2 (H) 08/10/2019 [id number]    Micro Results Recent Results (from the past 240 hour(s))  SARS CORONAVIRUS 2 (TAT 6-24 HRS) Nasopharyngeal Nasopharyngeal Swab     Status: Abnormal   Collection Time: 08/09/19  9:39 PM   Specimen: Nasopharyngeal Swab  Result Value Ref Range Status   SARS Coronavirus 2 POSITIVE (A) NEGATIVE Final  Comment: RESULT CALLED TO, READ BACK BY AND VERIFIED WITH: RN JEQUATA WOODY AT [place] 08/10/2019 (NOTE) SARS-CoV-2 target nucleic acids are DETECTED. The SARS-CoV-2 RNA is generally detectable in upper and lower respiratory specimens during the acute phase of infection. Positive results are indicative of the presence of SARS-CoV-2 RNA. Clinical correlation with patient history and other diagnostic information is  necessary to determine patient infection status. Positive results do not rule out bacterial infection or co-infection with other viruses.  The expected result is Negative. Fact Sheet for  Patients: SugarRoll.be Fact Sheet for Healthcare Providers: https://www.woods-mathews.com/ This test is not yet approved or cleared by the [place] FDA and  has been authorized for detection and/or diagnosis of SARS-CoV-2 by FDA under an Emergency Use Authorization (EUA). This EUA will remain  in effect (meaning this t est can be used) for the duration of the COVID-19 declaration under Section 564(b)(1) of the Act, 21 U.S.C. section 360bbb-3(b)(1), unless the authorization is terminated or revoked sooner. Performed at [facility], [place] [address], [place], [place] [address]     Radiology Reports DG Chest [place] 1 View  Result Date: 08/10/2019 CLINICAL DATA:  Shortness of breath. EXAM: PORTABLE CHEST 1 VIEW COMPARISON:  08/09/2019 FINDINGS: The patient is rotated to the right with grossly unchanged cardiomediastinal silhouette. Aortic atherosclerosis is noted. There is increasing opacity in the right lung base extending into the right midlung likely reflecting a veiling pleural effusion and airspace consolidation. New mild opacity is present in the left lung base. No pneumothorax is identified. IMPRESSION: 1. Increasing right pleural effusion and right lung consolidation concerning for pneumonia. 2. New mild left basilar atelectasis or pneumonia. Electronically Signed   By: Logan Bores M.D.   On: 08/10/2019 09:40   DG Chest Port 1 View  Result Date: 08/09/2019 CLINICAL DATA:  Cough, progressive weakness, symptoms for 6 weeks EXAM: PORTABLE CHEST 1 VIEW COMPARISON:  12/17/2018 FINDINGS: Single frontal view of the chest demonstrates an unremarkable cardiac silhouette. There is right basilar consolidation and small right pleural effusion. No pneumothorax. Left chest is clear. No acute bony abnormalities. IMPRESSION: 1. Right basilar consolidation and small right pleural effusion. Findings could reflect bronchopneumonia. Followup PA and lateral  chest X-ray is recommended in 3-4 weeks following trial of antibiotic therapy to ensure resolution. Electronically Signed   By: Randa Ngo M.D.   On: 08/09/2019 21:31   VAS [place] ABI WITH/WO TBI  Result Date: 07/12/2019 LOWER EXTREMITY DOPPLER STUDY Indications: Claudication, and peripheral artery disease. High Risk Factors: Hypertension, current smoker.  Vascular Interventions: Left Iliofemoral endarterectomy 02/02/2012. Left femoral                         to popliteal atherectomy and stent 10/13/2012. Comparison Study: Decreased right ABI since prior exam of 03/24/2017 Performing Technologist: Alvia Grove RVT  Examination Guidelines: A complete evaluation includes at minimum, Doppler waveform signals and systolic blood pressure reading at the level of bilateral brachial, anterior tibial, and posterior tibial arteries, when vessel segments are accessible. Bilateral testing is considered an integral part of a complete examination. Photoelectric Plethysmograph (PPG) waveforms and toe systolic pressure readings are included as required and additional duplex testing as needed. Limited examinations for reoccurring indications may be performed as noted.  ABI Findings: +---------+------------------+-----+----------+--------+ Right    Rt Pressure (mmHg)IndexWaveform  Comment  +---------+------------------+-----+----------+--------+ Brachial 165                                       +---------+------------------+-----+----------+--------+  PTA      97                0.59 monophasic         +---------+------------------+-----+----------+--------+ DP       88                0.53 monophasic         +---------+------------------+-----+----------+--------+ Great Toe70                0.42 Abnormal           +---------+------------------+-----+----------+--------+ +---------+------------------+-----+---------+-------+ Left     Lt Pressure (mmHg)IndexWaveform Comment  +---------+------------------+-----+---------+-------+ Brachial 162                                     +---------+------------------+-----+---------+-------+ PTA      158               0.96 triphasic        +---------+------------------+-----+---------+-------+ DP       175               1.06 biphasic         +---------+------------------+-----+---------+-------+ Great Toe75                0.45 Abnormal         +---------+------------------+-----+---------+-------+ +-------+-----------+-----------+------------+------------+ ABI/TBIToday's ABIToday's TBIPrevious ABIPrevious TBI +-------+-----------+-----------+------------+------------+ Right  0.59       0.42       0.87        0.74         +-------+-----------+-----------+------------+------------+ Left   1.06       0.45       1.01        1.02         +-------+-----------+-----------+------------+------------+  Summary: Right: Resting right ankle-brachial index indicates moderate right lower extremity arterial disease. The right toe-brachial index is abnormal. Left: Resting left ankle-brachial index is within normal range. No evidence of significant left lower extremity arterial disease. The left toe-brachial index is abnormal.  *See table(s) above for measurements and observations.  Electronically signed by Harold Barban MD on 07/12/2019 at 11:57:41 AM.    Final    VAS [place] LOWER EXTREMITY BYPASS GRAFT DUPL  Result Date: 07/12/2019 LOWER EXTREMITY ARTERIAL DUPLEX STUDY Indications: Claudication, and peripheral artery disease. High Risk Factors: Hypertension, current smoker.  Vascular Interventions: Left Iliofemoral endarterectomy 02/02/2012. Left femoral                         to popliteal atherectomy and stent 10/13/2012. Current ABI:            Right ABI 0.59 Left 1.03 Performing Technologist: Alvia Grove RVT  Examination Guidelines: A complete evaluation includes B-mode imaging, spectral Doppler, color Doppler, and power  Doppler as needed of all accessible portions of each vessel. Bilateral testing is considered an integral part of a complete examination. Limited examinations for reoccurring indications may be performed as noted.  +----------+--------+-----+--------+--------+--------+ LEFT      PSV cm/sRatioStenosisWaveformComments +----------+--------+-----+--------+--------+--------+ EIA [id number]                  biphasic         +----------+--------+-----+--------+--------+--------+ Left Iliofemoral segment measures 2.31 cn by 2.68.  Left Stent(s): +---------------+--------+--------------+--------+-----------------+ Prox to Stent  12  biphasicdampened          +---------------+--------+--------------+--------+-----------------+ Proximal Stent 54 / 1741-49% stenosisbiphasicabnormal waveform +---------------+--------+--------------+--------+-----------------+ Mid Stent      110                   biphasic                  +---------------+--------+--------------+--------+-----------------+ Distal Stent   57                    biphasiccalcific          +---------------+--------+--------------+--------+-----------------+ Distal to Stent86                    biphasic                  +---------------+--------+--------------+--------+-----------------+ Calcific plaque noted throughout the femoral vein   Summary: Left: Patent stent with increased velocity in the proximal segment (1-49%). Calcific plaque prevents thorough visualization.  See table(s) above for measurements and observations. Electronically signed by Harold Barban MD on 07/12/2019 at 11:58:18 AM.    Final    ECHOCARDIOGRAM LIMITED  Result Date: 08/10/2019    ECHOCARDIOGRAM LIMITED REPORT   Patient Name:   Ronnie Snyder Date of Exam: 08/10/2019 Medical Rec #:  [id number]            Height:       69.0 in Accession #:    [id number]           Weight:       144.0 lb Date of Birth:  Oct 21, 1940            BSA:           1.797 m Patient Age:    40 years             BP:           132/65 mmHg Patient Gender: M                    HR:           89 bpm. Exam Location:  Inpatient Procedure: Limited Echo, Cardiac Doppler and Limited Color Doppler Indications:    Acute Respiratory Insufficiency 518.82 / R06.89  History:        Patient has no prior history of Echocardiogram examinations.                 PAD; Risk Factors:Hypertension, Dyslipidemia and GERD. COVID-19                 Positive. Cancer. Abdominal Aortic Aneurysm.  Sonographer:    Jonelle Sidle Dance Referring Phys: [place]  1. Left ventricular ejection fraction, by estimation, is 60 to 65%. The left ventricle has normal function. The left ventricle has no regional wall motion abnormalities. Left ventricular diastolic function could not be evaluated.  2. Right ventricular systolic function is normal. The right ventricular size is mildly enlarged. There is moderately elevated pulmonary artery systolic pressure. The estimated right ventricular systolic pressure is Q000111Q mmHg.  3. Left atrial size was moderately dilated.  4. Right atrial size was mildly dilated.  5. The mitral valve is degenerative. Mild mitral valve regurgitation. No evidence of mitral stenosis.  6. The aortic valve has an indeterminant number of cusps. Aortic valve regurgitation is not visualized. Mild to moderate aortic valve stenosis.  7. The inferior vena cava is dilated in size with <50% respiratory variability, suggesting right atrial  pressure of 15 mmHg. FINDINGS  Left Ventricle: Left ventricular ejection fraction, by estimation, is 60 to 65%. The left ventricle has normal function. The left ventricle has no regional wall motion abnormalities. The left ventricular internal cavity size was normal in size. There is  no left ventricular hypertrophy. Left ventricular diastolic function could not be evaluated due to mitral annular calcification (moderate or greater). Right Ventricle:  The right ventricular size is mildly enlarged. No increase in right ventricular wall thickness. Right ventricular systolic function is normal. There is moderately elevated pulmonary artery systolic pressure. The tricuspid regurgitant velocity is 2.96 m/s, and with an assumed right atrial pressure of 15 mmHg, the estimated right ventricular systolic pressure is Q000111Q mmHg. Left Atrium: Left atrial size was moderately dilated. Right Atrium: Right atrial size was mildly dilated. Mitral Valve: The mitral valve is degenerative in appearance. Moderate mitral annular calcification. Mild mitral valve regurgitation. No evidence of mitral valve stenosis. Tricuspid Valve: The tricuspid valve is grossly normal. Tricuspid valve regurgitation is mild . No evidence of tricuspid stenosis. Aortic Valve: The aortic valve has an indeterminant number of cusps. . There is severe thickening and severe calcifcation of the aortic valve. Aortic valve regurgitation is not visualized. Mild to moderate aortic stenosis is present. There is severe thickening of the aortic valve. There is severe calcifcation of the aortic valve. Aortic valve mean gradient measures 16.0 mmHg. Aortic valve peak gradient measures 32.3 mmHg. Aortic valve area, by VTI measures 1.57 cm. Pulmonic Valve: The pulmonic valve was grossly normal. Pulmonic valve regurgitation is not visualized. No evidence of pulmonic stenosis. Aorta: The aortic root is normal in size and structure. Venous: The inferior vena cava is dilated in size with less than 50% respiratory variability, suggesting right atrial pressure of 15 mmHg. IAS/Shunts: No atrial level shunt detected by color flow Doppler.  LEFT VENTRICLE PLAX 2D LVIDd:         4.81 cm LVIDs:         4.02 cm LV PW:         1.12 cm LV IVS:        0.95 cm LVOT diam:     2.00 cm LV SV:         86 LV SV Index:   48 LVOT Area:     3.14 cm  RIGHT VENTRICLE          IVC RV Basal diam:  2.72 cm  IVC diam: 2.58 cm LEFT ATRIUM               Index       RIGHT ATRIUM           Index LA diam:        5.00 cm  2.78 cm/m  RA Area:     21.60 cm LA Vol (A2C):   116.0 ml 64.57 ml/m RA Volume:   66.30 ml  36.90 ml/m LA Vol (A4C):   78.6 ml  43.75 ml/m LA Biplane Vol: 94.7 ml  52.71 ml/m  AORTIC VALVE AV Area (Vmax):    1.56 cm AV Area (Vmean):   1.47 cm AV Area (VTI):     1.57 cm AV Vmax:           284.00 cm/s AV Vmean:          173.000 cm/s AV VTI:            0.548 m AV Peak Grad:      32.3 mmHg AV Mean Grad:  16.0 mmHg LVOT Vmax:         141.00 cm/s LVOT Vmean:        80.700 cm/s LVOT VTI:          0.274 m LVOT/AV VTI ratio: 0.50  AORTA Ao Root diam: 3.80 cm Ao Asc diam:  3.30 cm MITRAL VALVE                TRICUSPID VALVE MV Area (PHT): 3.53 cm     TR Peak grad:   35.0 mmHg MV Decel Time: 215 msec     TR Vmax:        296.00 cm/s MV E velocity: 134.00 cm/s MV A velocity: 167.00 cm/s  SHUNTS MV E/A ratio:  0.80         Systemic VTI:  0.27 m                             Systemic Diam: 2.00 cm Eleonore Chiquito MD Electronically signed by Eleonore Chiquito MD Signature Date/Time: 08/10/2019/2:02:07 PM    Final
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
## 2019-08-11 DIAGNOSIS — D508 Other iron deficiency anemias: Secondary | ICD-10-CM

## 2019-08-11 DIAGNOSIS — I10 Essential (primary) hypertension: Secondary | ICD-10-CM

## 2019-08-11 DIAGNOSIS — I639 Cerebral infarction, unspecified: Secondary | ICD-10-CM

## 2019-08-11 LAB — HEMOGLOBIN AND HEMATOCRIT, BLOOD
HCT: 28 % — ABNORMAL LOW (ref 39.0–52.0)
Hemoglobin: 8.2 g/dL — ABNORMAL LOW (ref 13.0–17.0)

## 2019-08-11 LAB — CBC
HCT: 25 % — ABNORMAL LOW (ref 39.0–52.0)
Hemoglobin: 7 g/dL — ABNORMAL LOW (ref 13.0–17.0)
MCH: 21.7 pg — ABNORMAL LOW (ref 26.0–34.0)
MCHC: 28 g/dL — ABNORMAL LOW (ref 30.0–36.0)
MCV: 77.6 fL — ABNORMAL LOW (ref 80.0–100.0)
Platelets: 155 10*3/uL (ref 150–400)
RBC: 3.22 MIL/uL — ABNORMAL LOW (ref 4.22–5.81)
RDW: 20.2 % — ABNORMAL HIGH (ref 11.5–15.5)
WBC: 4.6 10*3/uL (ref 4.0–10.5)
nRBC: 1.5 % — ABNORMAL HIGH (ref 0.0–0.2)

## 2019-08-11 LAB — PREPARE RBC (CROSSMATCH)

## 2019-08-11 LAB — COMPREHENSIVE METABOLIC PANEL
ALT: 133 U/L — ABNORMAL HIGH (ref 0–44)
AST: 59 U/L — ABNORMAL HIGH (ref 15–41)
Albumin: 3 g/dL — ABNORMAL LOW (ref 3.5–5.0)
Alkaline Phosphatase: 71 U/L (ref 38–126)
Anion gap: 10 (ref 5–15)
BUN: 16 mg/dL (ref 8–23)
CO2: 34 mmol/L — ABNORMAL HIGH (ref 22–32)
Calcium: 8.5 mg/dL — ABNORMAL LOW (ref 8.9–10.3)
Chloride: 94 mmol/L — ABNORMAL LOW (ref 98–111)
Creatinine, Ser: 0.67 mg/dL (ref 0.61–1.24)
GFR calc Af Amer: >60 mL/min (ref >60)
GFR calc non Af Amer: >60 mL/min (ref >60)
Glucose, Bld: 115 mg/dL — ABNORMAL HIGH (ref 70–99)
Potassium: 3.4 mmol/L — ABNORMAL LOW (ref 3.5–5.1)
Sodium: 138 mmol/L (ref 135–145)
Total Bilirubin: 0.8 mg/dL (ref 0.3–1.2)
Total Protein: 5 g/dL — ABNORMAL LOW (ref 6.5–8.1)

## 2019-08-11 LAB — D-DIMER, QUANTITATIVE: D-Dimer, Quant: 1.44 ug/mL-FEU — ABNORMAL HIGH (ref 0.00–0.50)

## 2019-08-11 LAB — PROTIME-INR
INR: 1.4 — ABNORMAL HIGH (ref 0.8–1.2)
Prothrombin Time: 17.1 seconds — ABNORMAL HIGH (ref 11.4–15.2)

## 2019-08-11 LAB — FERRITIN: Ferritin: 22 ng/mL — ABNORMAL LOW (ref 24–336)

## 2019-08-11 LAB — C-REACTIVE PROTEIN: CRP: 0.6 mg/dL (ref <1.0)

## 2019-08-11 MED ORDER — ACETAMINOPHEN 325 MG PO TABS
650.0000 mg | ORAL_TABLET | Freq: Once | ORAL | Status: AC
  Administered 2019-08-11: 650 mg via ORAL
  Filled 2019-08-11: qty 2

## 2019-08-11 MED ORDER — SODIUM CHLORIDE 0.9% IV SOLUTION: Freq: Once | INTRAVENOUS | Status: AC

## 2019-08-11 MED ORDER — DIPHENHYDRAMINE HCL 50 MG/ML IJ SOLN
25.0000 mg | Freq: Once | INTRAMUSCULAR | Status: AC
  Administered 2019-08-11: 25 mg via INTRAVENOUS
  Filled 2019-08-11: qty 1

## 2019-08-11 MED ORDER — BISACODYL 5 MG PO TBEC
20.0000 mg | DELAYED_RELEASE_TABLET | Freq: Once | ORAL | Status: DC
  Filled 2019-08-11: qty 4

## 2019-08-11 MED ORDER — FUROSEMIDE 10 MG/ML IJ SOLN: 20.0000 mg | Freq: Once | INTRAMUSCULAR | Status: DC

## 2019-08-11 MED ORDER — POTASSIUM CHLORIDE CRYS ER 20 MEQ PO TBCR
40.0000 meq | EXTENDED_RELEASE_TABLET | ORAL | Status: AC
  Administered 2019-08-11 (×2): 40 meq via ORAL
  Filled 2019-08-11 (×2): qty 2

## 2019-08-11 MED ORDER — FUROSEMIDE 40 MG PO TABS
40.0000 mg | ORAL_TABLET | Freq: Every day | ORAL | Status: DC
  Administered 2019-08-11 – 2019-08-12 (×2): 40 mg via ORAL
  Filled 2019-08-11 (×2): qty 1

## 2019-08-11 MED ORDER — SODIUM CHLORIDE 0.9 % IV SOLN
510.0000 mg | Freq: Once | INTRAVENOUS | Status: AC
  Administered 2019-08-11: 510 mg via INTRAVENOUS
  Filled 2019-08-11: qty 17

## 2019-08-11 MED ORDER — TAMSULOSIN HCL 0.4 MG PO CAPS
0.8000 mg | ORAL_CAPSULE | Freq: Every day | ORAL | Status: DC
  Administered 2019-08-12 – 2019-08-15 (×4): 0.8 mg via ORAL
  Filled 2019-08-11 (×4): qty 2

## 2019-08-11 NOTE — Progress Notes (Addendum)
Daily Rounding Note  08/11/2019, 10:22 AM  LOS: 1 day   SUBJECTIVE:   Chief complaint: Anemia.    Spoke with patient's RN.  No BMs and pt cannot tell her when last had BM.  No abdominal pain.  No dysphagia.  Swallowing clear liquids and medications without any problems.  No speech difficulty.  OBJECTIVE:         Vital signs in last 24 hours:    Temp:  [97.8 F (36.6 C)-98.6 F (37 C)] 97.8 F (36.6 C) (03/23 1913) Pulse Rate:  [76-88] 76 (03/24 0600) Resp:  [17-22] 18 (03/24 0600) BP: (99-142)/(51-67) 142/67 (03/24 0600) SpO2:  [93 %-99 %] 97 % (03/24 0600) Last BM Date: 08/08/19 Filed Weights   08/09/19 1710 08/09/19 2334  Weight: 65.3 kg 65.3 kg   Patient not reexamined.   Intake/Output from previous day: 03/23 0701 - 03/24 0700 In: 1022.8 [P.O.:680; Blood:244; IV Piggyback:98.8] Out: 1630 [Urine:1630]  Intake/Output this shift: Total I/O In: -  Out: 775 [Urine:775]  Lab Results: Recent Labs    08/10/19 0627 08/10/19 0627 08/10/19 1117 08/10/19 2024 08/11/19 0354  WBC 8.4  --  8.8  --  4.6  HGB 6.6*   < > 6.5* 7.4* 7.0*  HCT 25.1*   < > 24.8* 26.6* 25.0*  PLT 216  --  177  --  155   < > = values in this interval not displayed.   BMET Recent Labs    08/09/19 1917 08/10/19 0627 08/11/19 0354  NA 138 140 138  K 4.7 4.4 3.4*  CL 98 102 94*  CO2 29 30 34*  GLUCOSE 118* 107* 115*  BUN 31* 26* 16  CREATININE 0.90 0.67 0.67  CALCIUM 8.6* 8.0* 8.5*   LFT Recent Labs    08/09/19 1917 08/10/19 0627 08/11/19 0354  PROT 6.0* 5.5* 5.0*  ALBUMIN 3.5 3.3* 3.0*  AST 129* 146* 59*  ALT 164* 190* 133*  ALKPHOS 82 78 71  BILITOT 0.7 1.3* 0.8   PT/INR Recent Labs    08/11/19 0354  LABPROT 17.1*  INR 1.4*   Hepatitis Panel No results for input(s): HEPBSAG, HCVAB, HEPAIGM, HEPBIGM in the last 72 hours.  Studies/Results: MR BRAIN WO CONTRAST  Result Date: 08/10/2019 CLINICAL DATA:   Initial evaluation for acute ataxia, stroke suspected. EXAM: MRI HEAD WITHOUT CONTRAST TECHNIQUE: Multiplanar, multiecho pulse sequences of the brain and surrounding structures were obtained without intravenous contrast. COMPARISON:  None available. FINDINGS: Brain: Examination degraded by motion artifact. Generalized age-related cerebral atrophy with mild chronic small vessel ischemic disease. There is a small 8 mm focus of restricted diffusion involving the right frontal periventricular white matter adjacent to the right frontal horn, consistent with a small acute ischemic small vessel type infarct (series 2, image 27). No associated hemorrhage or mass effect. No other abnormal foci of restricted diffusion to suggest acute or subacute ischemia. Gray-white matter differentiation otherwise maintained. No encephalomalacia to suggest chronic cortical infarction. No foci of susceptibility artifact to suggest acute or chronic intracranial hemorrhage. No mass lesion, midline shift or mass effect. No hydrocephalus or extra-axial fluid collection. Pituitary gland and suprasellar region within normal limits. Midline structures intact. Vascular: Major intracranial vascular flow voids are maintained. Skull and upper cervical spine: Craniocervical junction within normal limits. Bone marrow signal intensity normal. No scalp soft tissue abnormality. Sinuses/Orbits: Globes and orbital soft tissues within normal limits. Paranasal sinuses are clear. Small bilateral mastoid effusions noted, of  doubtful significance. Trace layering fluid noted layering within the nasopharynx as well. Other: None. IMPRESSION: 1. 8 mm acute ischemic nonhemorrhagic small vessel type infarct involving the right frontal periventricular white matter as above. 2. No other acute intracranial abnormality. 3. Mild age-related cerebral atrophy with chronic small vessel ischemic disease. Electronically Signed   By: Jeannine Boga M.D.   On: 08/10/2019  19:05   CT ABDOMEN PELVIS W CONTRAST  Result Date: 08/10/2019 CLINICAL DATA:  Abdominal pain. EXAM: CT ABDOMEN AND PELVIS WITH CONTRAST. FINDINGS: Lower chest: Mild to moderate severity areas of atelectasis and/or infiltrate are seen within the bilateral lung bases. There are small bilateral pleural effusions. Hepatobiliary: No focal liver abnormality is seen. No gallstones, gallbladder wall thickening, or biliary dilatation. Pancreas: Unremarkable. No pancreatic ductal dilatation or surrounding inflammatory changes. Spleen: Normal in size without focal abnormality. Adrenals/Urinary Tract: Adrenal glands are unremarkable. Kidneys are normal, without renal calculi, focal lesion, or hydronephrosis. Bladder is unremarkable. Stomach/Bowel: Stomach is within normal limits. Appendix appears normal. No evidence of bowel wall thickening, distention, or inflammatory changes. Noninflamed diverticula are seen throughout the descending and sigmoid colon. Vascular/Lymphatic: There is marked severity calcification and tortuosity of the abdominal aorta. 3.7 cm x 3.1 cm aneurysmal dilatation of the infrarenal abdominal aorta is seen. No enlarged abdominal or pelvic lymph nodes. Reproductive: Prostate is unremarkable. Other: No abdominal wall hernia or abnormality. No abdominopelvic ascites. Musculoskeletal: Multilevel degenerative changes seen throughout the lumbar spine. A metallic density compression screw device is seen within the proximal right femur. IMPRESSION: 1. Mild to moderate severity bibasilar atelectasis and/or infiltrate with small bilateral pleural effusions. 2. Noninflamed diverticula throughout the descending and sigmoid colon. 3. Infrarenal abdominal aortic aneurysm measuring 3.7 cm x 3.1 cm. Recommend followup by ultrasound in 3 years. This recommendation follows ACR consensus guidelines: White Paper of the ACR Incidental Findings Committee II on Vascular Findings. J Am Coll Radiol 2013; 10: Aortic  Atherosclerosis (ICD10-I70.0). Electronically Signed   By: Virgina Norfolk M.D.   On: 08/10/2019 21:32   DG Chest Port 1 View  Result Date: 08/10/2019 CLINICAL DATA:  Shortness of breath. EXAM: PORTABLE CHEST 1 VIEW COMPARISON:  08/09/2019 FINDINGS: The patient is rotated to the right with grossly unchanged cardiomediastinal silhouette. Aortic atherosclerosis is noted. There is increasing opacity in the right lung base extending into the right midlung likely reflecting a veiling pleural effusion and airspace consolidation. New mild opacity is present in the left lung base. No pneumothorax is identified. IMPRESSION: 1. Increasing right pleural effusion and right lung consolidation concerning for pneumonia. 2. New mild left basilar atelectasis or pneumonia. Electronically Signed   By: Logan Bores M.D.   On: 08/10/2019 09:40     ECHOCARDIOGRAM LIMITED  Result Date: 08/10/2019 IMPRESSIONS  1. Left ventricular ejection fraction, by estimation, is 60 to 65%. The left ventricle has normal function. The left ventricle has no regional wall motion abnormalities. Left ventricular diastolic function could not be evaluated.  2. Right ventricular systolic function is normal. The right ventricular size is mildly enlarged. There is moderately elevated pulmonary artery systolic pressure. The estimated right ventricular systolic pressure is [id number] mmHg.  3. Left atrial size was moderately dilated.  4. Right atrial size was mildly dilated.  5. The mitral valve is degenerative. Mild mitral valve regurgitation. No evidence of mitral stenosis.  6. The aortic valve has an indeterminant number of cusps. Aortic valve regurgitation is not visualized. Mild to moderate aortic valve stenosis.  7. The inferior vena cava  is dilated in size with <50% respiratory variability, suggesting right atrial pressure of 15 mmHg. Electronically signed by Eleonore Chiquito MD   Scheduled Meds: . sodium chloride   Intravenous Once  . amLODipine  2.5  mg Oral Daily  . cilostazol  100 mg Oral BID AC  . dexamethasone (DECADRON) injection  6 mg Intravenous Q24H  . furosemide  40 mg Intravenous BID  . lactulose  20 g Oral BID  . levothyroxine  75 mcg Oral Q0600  . losartan  100 mg Oral Daily  . pantoprazole  40 mg Oral Q0600  . potassium chloride  40 mEq Oral Q4H  . tamsulosin  0.4 mg Oral Daily   Continuous Infusions: . sodium chloride    . levofloxacin (LEVAQUIN) IV 750 mg (08/11/19 0803)  . remdesivir 100 mg in NS 100 mL     PRN Meds:.acetaminophen **OR** acetaminophen, ondansetron **OR** ondansetron (ZOFRAN) IV  ASSESMENT:   *    Covid 19 +.   Right base consolidation, effusion, likely bronchopneumonia.  New left base atx vs PNA.  Remdesivir, decadron, levaquin in place  *    IDA, low ferritin.  FOBT negative. Hgb 4.8  > 2 PRBCs >> 7.    *    Elevated transaminases, improved.  No evidence for parenchymal liver or biliary disease on CT scan. INR 1.4.  Slight elevation Ammonia 57, lactulose added 3/23.    *    Acute, punctate right frontal CVA of little significance, no obvious deficits.  Neuro seeing pt now.  Chronic Plavix Rxd Feb thru May 2021, but he quit this 06/2019 due to unspecified s/e.     *     Constipation. No hypocalcemia if Ca corrected for hypoalbuminemia.  No intestinal abnormalities on CT. No bowel movement yet despite 30 g lactulose yesterday, and 2 subsequent 20 g doses.  *    2004 hx Barrett's esophagus at his last/only EGD.  Long overdue for surveillance EGD.  *      ASPVD.  Previous endarterectomy, atherectomy, stenting LLE. On Pletal. Outpt Plavix as per CVA above.     PLAN   *    No plans for EGD/colonoscopy in pt w Covid PNA and FOBT negative stool, no active GI hemorrhaging.  Will make arrangements for outpt fup and eventual diagnostic EGD and colonoscopy (if/when appropriate).     *   Feraheme infusion, ordered.    *   Hep C antibody, hep B surface antigen, surface antibody ordered.    *     Dulcolax 20 mg ordered x 1.     *   Advance to [facility] diet.    Azucena Freed  08/11/2019, 10:22 AM Phone [phone]  GI ATTENDING  Interval history and data reviewed.  Agree with interval progress note as outlined.  Principal problems are Covid related pneumonia and stroke.  Consulted for anemia without GI bleeding.  Agree with ongoing indefinite PPI therapy and transfuse as clinically indicated.  No recommendations or plans for endoscopic evaluations in the immediate future.  No additional recommendations from a GI perspective.  Ongoing care per primary service.  We will sign off.  Docia Chuck. Geri Seminole., M.D. [facility] Division of Gastroenterology

## 2019-08-11 NOTE — Plan of Care (Signed)

## 2019-08-11 NOTE — Progress Notes (Signed)
   08/11/19 1421  Vitals  Temp 98.1 F (36.7 C)  Temp Source Axillary  BP (!) 92/46  MAP (mmHg) (!) 60  Pulse Rate 78  ECG Heart Rate 80  Resp (!) 22  Oxygen Therapy  SpO2 93 %  O2 Device Room Air  MEWS Score  MEWS Temp 0  MEWS Systolic 1  MEWS Pulse 0  MEWS RR 1  MEWS LOC 0  MEWS Score 2  MEWS Score Color Yellow  MEWS Assessment  Is this an acute change? Yes  MEWS guidelines implemented *See Row Information* Yellow  Provider Notification  Provider Name/Title Ghimire  Date Provider Notified 08/11/19  Time Provider Notified 1444  Notification Type Page  Notification Reason Other (Comment) (per protocol)  Response No new orders  Date of Provider Response 08/11/19  Time of Provider Response [id number]  Pt yellow MEWs. Dr. Sloan Leiter notified. Pt asymptomatic. Currently receiving blood. Other VSS. No new orders at this time. Will continue to monitor.

## 2019-08-11 NOTE — Consult Note (Addendum)
Neurology Consultation  Reason for Consult: Stroke Referring Physician: Jonetta Osgood, MD  CC: Incidental finding on MRI/stroke  History is obtained from: Chart and patient  HPI: Ronnie Snyder is a 79 y.o. male with pertinent history of PAD, colonic polyps, hypertension, hypercholesterolemia, anemia.  Patient presented to the hospital weakness and confusion along with hemoglobin of 4.8 and acute respiratory failure requiring oxygen.  Subsequently patient was found to be Covid positive.  Due to the ongoing confusion, an MRI was ordered.  Patient was admitted for severe anemia which was thought to be a GI bleed along with hypoxemic Covid.  While patient was in the hospital MRI was obtained and showed a punctate infarct thus neurology was called for the stroke.  He does not report any focal sensorimotor deficits.  Does not report any vision changes.  Does not note any chest pain.  Does not report of any shortness of breath at this time.  He was supposed to be on a Plavix but he has not been on it due to some side effects.  Patient does have iron deficiency anemia however is fecal occult blood test was negative.  Due to his hemoglobin being 4.8 he did receive 1 packed red blood cell which brought his hemoglobin up to 6.6.  Labs and work-up that is of importance: -Metabolic panel and CBC.  Patient initially came in with a low hemoglobin at current time it is 7.0 which is still very low. -Hematocrit also low at 25 -Platelets 155 -Potassium 3.4 -Albumin 3.0 -AST 59 -ALT 133 -Ammonia 57   LKW: Unknown tpa given?: no, unknown last known normal Premorbid modified Rankin scale (mRS): 0 NIH stroke scale: 2  Past Medical History:  Diagnosis Date  . Abdominal aortic aneurysm (HCC)    3.5 cm 10/2016 L-spine CT (previously 3.0 cm by U/S 02/25/11)  . Anxiety   . Arthritis    "in my feet" (10/13/2012)  . GERD (gastroesophageal reflux disease)   . H/O hiatal hernia   . Hepatic  steatosis   . High cholesterol    "at one time; it's fine now" (10/13/2012)  . HTN (hypertension)   . Hx of colonic polyps   . Hypothyroidism   . PAD (peripheral artery disease) ([place])   . Skin cancer    "burned them off my arm and such" (10/13/2012)    Family History  Problem Relation Age of Onset  . Hypertension Mother   . Hypertension Father   . Hypertension Sister   . Hypertension Sister    Social History:   reports that he has quit smoking. His smoking use included cigarettes. He has a 14.25 pack-year smoking history. He has never used smokeless tobacco. He reports previous alcohol use. He reports that he does not use drugs.  Medications  Current Facility-Administered Medications:  .  0.9 %  sodium chloride infusion (Manually program via Guardrails IV Fluids), , Intravenous, Once, Ghimire, Henreitta Leber, MD, Stopped at 08/10/19 1250 .  0.9 %  sodium chloride infusion (Manually program via Guardrails IV Fluids), , Intravenous, Once, Ghimire, [employer], MD .  0.9 %  sodium chloride infusion, 10 mL/hr, Intravenous, Once, Rise Patience, MD .  acetaminophen (TYLENOL) tablet 650 mg, 650 mg, Oral, Q6H PRN **OR** acetaminophen (TYLENOL) suppository 650 mg, 650 mg, Rectal, Q6H PRN, Rise Patience, MD .  amLODipine (NORVASC) tablet 2.5 mg, 2.5 mg, Oral, Daily, Rise Patience, MD, 2.5 mg at 08/11/19 0811 .  bisacodyl (DULCOLAX) EC tablet 20  mg, 20 mg, Oral, Once, Gribbin, Charlynne Cousins, PA-C .  cilostazol (PLETAL) tablet 100 mg, 100 mg, Oral, BID AC, Rise Patience, MD, 100 mg at 08/11/19 0810 .  dexamethasone (DECADRON) injection 6 mg, 6 mg, Intravenous, Q24H, Rise Patience, MD, 6 mg at 08/11/19 [id number] .  furosemide (LASIX) tablet 40 mg, 40 mg, Oral, Daily, Ghimire, Shanker M, MD .  lactulose (CHRONULAC) 10 GM/15ML solution 20 g, 20 g, Oral, BID, Vena Rua, PA-C, 20 g at 08/11/19 [id number] .  levothyroxine (SYNTHROID) tablet 75 mcg, 75 mcg, Oral, Q0600, Rise Patience, MD, 75 mcg at 08/11/19 [phone] .  losartan (COZAAR) tablet 100 mg, 100 mg, Oral, Daily, Rise Patience, MD, 100 mg at 08/11/19 0811 .  ondansetron (ZOFRAN) tablet 4 mg, 4 mg, Oral, Q6H PRN **OR** ondansetron (ZOFRAN) injection 4 mg, 4 mg, Intravenous, Q6H PRN, Rise Patience, MD .  pantoprazole (PROTONIX) EC tablet 40 mg, 40 mg, Oral, Q0600, Vena Rua, PA-C, 40 mg at 08/11/19 [id number] .  [COMPLETED] remdesivir 200 mg in sodium chloride 0.9% 250 mL IVPB, 200 mg, Intravenous, Once, Stopped at 08/10/19 0929 **FOLLOWED BY** remdesivir 100 mg in sodium chloride 0.9 % 100 mL IVPB, 100 mg, Intravenous, Daily, Rise Patience, MD, Last Rate: 200 mL/hr at 08/11/19 1305, 100 mg at 08/11/19 1305 .  [START ON 08/12/2019] tamsulosin (FLOMAX) capsule 0.8 mg, 0.8 mg, Oral, Daily, Ghimire, Shanker M, MD  ROS:    General ROS: negative for - chills, fatigue, fever, night sweats, weight gain or weight loss Psychological ROS: negative for - behavioral disorder, hallucinations, memory difficulties, mood swings or suicidal ideation Ophthalmic ROS: negative for - blurry vision, double vision, eye pain or loss of vision ENT ROS: negative for - epistaxis, nasal discharge, oral lesions, sore throat, tinnitus or vertigo Respiratory ROS: negative for - cough, hemoptysis, shortness of breath or wheezing Cardiovascular ROS: negative for - chest pain, dyspnea on exertion, edema or irregular heartbeat Gastrointestinal ROS: negative for - abdominal pain, diarrhea, hematemesis, nausea/vomiting or stool incontinence Genito-Urinary ROS: negative for - dysuria, hematuria, incontinence or urinary frequency/urgency Musculoskeletal ROS: Positive for -muscular weakness Neurological ROS: as noted in HPI Dermatological ROS: negative for rash and skin lesion changes  Exam: Current vital signs: BP (!) 96/55 (BP Location: Left Arm)   Pulse 85   Temp 98.1 F (36.7 C) (Oral)   Resp 18   Ht 5\' 9"  (1.753 m)    Wt 65.3 kg   SpO2 92%   BMI 21.26 kg/m  Vital signs in last 24 hours: Temp:  [97.8 F (36.6 C)-98.6 F (37 C)] 98.1 F (36.7 C) (03/24 1208) Pulse Rate:  [76-88] 85 (03/24 1208) Resp:  [17-22] 18 (03/24 1208) BP: (96-142)/(51-67) 96/55 (03/24 1208) SpO2:  [92 %-99 %] 92 % (03/24 1208)   Constitutional: Appears well-developed and well-nourished.  Psych: Affect appropriate to situation Eyes: No scleral injection HENT: No OP obstrucion Head: Normocephalic.  Cardiovascular: Normal rate and regular rhythm.  Respiratory: Effort normal, non-labored breathing GI: Soft.  No distension. There is no tenderness.  Skin: WDI Neuro: Mental Status: Patient is awake, alert oriented to self, fact that he is in the hospital but could not give me the correct month. Speech-intact without dysarthria. Patient is able to give a clear and coherent history. Cranial Nerves: II: Visual Fields are full.  III,IV, VI: EOMI without ptosis or diploplia. Pupils equal, round and reactive to light V: Facial sensation is symmetric to temperature VII: Resting  left facial droop which is minor VIII: hearing is intact to voice X: Palat elevates symmetrically XI: Shoulder shrug is symmetric. XII: tongue is midline without atrophy or fasciculations.  Motor: Tone is normal. Bulk is normal. 5/5 strength in the upper extremities with 4/5 strength in lower extremities. Positive aterixis especially noted in the upper extremities when arms held out Sensory: Sensation is symmetric to light touch and temperature in the arms and legs. Deep Tendon Reflexes: 2+ and symmetric in the biceps  Plantars: Toes are downgoing bilaterally.  Cerebellar: FNF and HKS are intact bilaterally  Labs I have reviewed labs in epic and the results pertinent to this consultation are:   CBC    Component Value Date/Time   WBC 4.6 08/11/2019 0354   RBC 3.22 (L) 08/11/2019 0354   HGB 7.0 (L) 08/11/2019 0354   HCT 25.0 (L) 08/11/2019  0354   PLT 155 08/11/2019 0354   MCV 77.6 (L) 08/11/2019 0354   MCH 21.7 (L) 08/11/2019 0354   MCHC 28.0 (L) 08/11/2019 0354   RDW 20.2 (H) 08/11/2019 0354   LYMPHSABS 1.1 08/10/2019 0627   MONOABS 0.3 08/10/2019 0627   EOSABS 0.2 08/10/2019 0627   BASOSABS 0.0 08/10/2019 0627    CMP     Component Value Date/Time   NA 138 08/11/2019 0354   K 3.4 (L) 08/11/2019 0354   CL 94 (L) 08/11/2019 0354   CO2 34 (H) 08/11/2019 0354   GLUCOSE 115 (H) 08/11/2019 0354   BUN 16 08/11/2019 0354   CREATININE 0.67 08/11/2019 0354   CALCIUM 8.5 (L) 08/11/2019 0354   PROT 5.0 (L) 08/11/2019 0354   ALBUMIN 3.0 (L) 08/11/2019 0354   AST 59 (H) 08/11/2019 0354   ALT 133 (H) 08/11/2019 0354   ALKPHOS 71 08/11/2019 0354   BILITOT 0.8 08/11/2019 0354   GFRNONAA >60 08/11/2019 0354   GFRAA >60 08/11/2019 0354    Imaging I have reviewed the images obtained:  MRI examination of the brain:8 mm acute ischemic nonhemorrhagic small vessel type infarct involving the right frontal periventricular white matter   Etta Quill PA-C [facility] [phone]  M-F  (9:00 am- 5:00 PM)  08/11/2019, 1:52 PM     Assessment:  79 year old male who presented to the hospital with weakness and confusion found to have COVID-19 along with anemia.  Found to be fecal occult blood negative.  MRI brain was obtained for confusion and did show an 8 mm acute ischemic nonhemorrhagic infarct in the right frontal periventricular white matter which most likely asymptomatic and secondary to hypoperfusion/embolic event in the setting of acute anemia.  Impression: Acute ischemic stroke-likely asymptomatic in the setting of severe anemia. Evaluate for cervical vascular stenosis and other risk factors.  Recommend -CTA of head and neck -Transthoracic Echo -Restart Plavix when felt to be safe by primary and GI -Start or continue Atorvastatin 80 mg/other high intensity statin -BP goal: Normotensive with systolic less  than [id number] and diastolic less than 90 -HBAIC and Lipid profile -Telemetry monitoring -Frequent neuro checks -NPO until passes stroke swallow screen -PT/OT # please page stroke NP  Or  PA  Or MD from 8am -4 pm  as this patient from this time will be  followed by the stroke.   You can look them up on [email or web address]  [place]  Attending Neurohospitalist Addendum Patient seen and examined with APP/Resident. Agree with the history and physical as documented above. Agree with the plan as documented, which I helped formulate. I have independently  reviewed the chart, obtained history, review of systems and examined the patient.I have personally reviewed pertinent head/neck/spine imaging (CT/MRI). Please feel free to call with any questions. --- Amie Portland, MD [facility] Pager: [phone]  If 7pm to 7am, please call on call as listed on AMION.

## 2019-08-11 NOTE — Progress Notes (Addendum)
PROGRESS NOTE                                                                                                                                                                                                             Patient Demographics:    Ronnie Snyder, is a 79 y.o. male, DOB - 03/08/1941, [id number]  Outpatient Primary MD for the patient is Luetta Nutting, DO   Admit date - 08/09/2019   LOS - 1  No chief complaint on file.      Brief Narrative: Patient is a 79 y.o. male with PMHx of PAD, AAA hypothyroidism, HTN-who presented with weakness and confusion-was found to have a hemoglobin of 4.8, elevated liver enzymes-and acute hypoxic respiratory failure requiring oxygen supplementation.  He received 2 units of PRBC-and was subsequently admitted to the hospitalist service.  Subsequently his Covid PCR came back positive.  See below for further details.  Significant Events: 3/22>> admit for severe anemia likely from recent GI bleeding, hypoxemia-Covid positive and confusion.  COVID-19 medications: Steroids: 3/23>> Remdesivir: 3/23>>  Antibiotics: Levofloxacin: 3/23>>  Microbiology data: 3/22>> Covid positive  DVT prophylaxis: SCDs  Procedures: None  Consults: Gastroenterology   Subjective:   He is completely awake and alert this morning.  He was initially on 3 L but has been titrated down to room air.   Assessment  & Plan :   Acute Hypoxic Resp Failure: Etiology not certain-although he is COVID-19 positive-CRP not significantly elevated to suggest significant infection/inflammation.  Procalcitonin not elevated-no white count or fever to suggest bacterial pneumonia.  Suspect could have had pulmonary edema/decompensated diastolic heart failure-and has improved rapidly with diuresis-now on room air (was on NRB on 3/23).  We will go and stop levofloxacin-he really does not have any volume overload on  exam-hypoxia has improved-we will transition to oral diuretics.  For now continue with steroids and remdesivir-we will follow clinical trajectory and inflammatory markers.  Fever: afebrile  O2 requirements:  SpO2: 92 % O2 Flow Rate (L/min): 3 L/min   COVID-19 Labs: Recent Labs    08/10/19 0627 08/10/19 0747 08/11/19 0354  DDIMER  --  0.99* 1.44*  FERRITIN 5*  --  22*  CRP  --  0.6 0.6       Component Value Date/Time   BNP 827.2 (H) 08/10/2019 0627    Recent Labs  Lab 08/10/19 0747  PROCALCITON <0.10    Lab Results  Component Value Date   SARSCOV2NAA POSITIVE (A) 08/09/2019     Prone/Incentive Spirometry: encouraged  incentive spirometry use 3-4/hour.  DVT Prophylaxis  :  SCDs   Transaminitis: Denies any abdominal pain-could be from Covid-with acute hepatitis serology.  Recent lower GI bleeding with acute blood loss anemia: Acknowledges melanotic stools last week-is microcytic-and iron deficient.  Has received 3 units of PRBC transfusion so far-repeat CBC still at 7.0.  We will go ahead and transfusion this morning-if hemoglobin still less than 7-we will go and transfuse 1 additional unit given the fact that he does have cardiovascular disease.  Gastroenterology following-since not actively bleeding-recommendations up to proceed with endoscopic evaluation likely in the outpatient setting.  Acute metabolic encephalopathy: Suspect secondary to hypoxemia-possible Covid/bacterial pneumonia.  He was very lethargic on 3/23-completely awake alert and following all commands on 3/24.  MRI brain showed a small ischemic infarct in the right frontal lobe-which I suspect is incidental-but have asked neurology to consult.    Acute right frontal lobe infarct: See above-likely incidental-given recent GI bleed-severity of anemia-likely not a candidate for antiplatelets at this time.  He was on Plavix before this hospitalization which is currently on hold.  HTN: Blood pressure reasonably  well controlled-continue losartan and amlodipine.  PAD: Stable-given recent history of GI bleeding and severity of anemia-no longer on Plavix-remains on Pletal.  Hypothyroidism: Continue with Synthroid  BPH: Continue Flomax-we will change dosage to 0.8 mg.  On 3/23-he was noted to have post void residual of around 500 cc-he refused a Foley catheter-we will follow with serial bladder scans.    ABG:    Component Value Date/Time   TCO2 27 10/13/2012 1041    Vent Settings: N/A   Condition -  Guarded  Family Communication  :  Spouse updated over the phone 3/24-but per son -spouse has dementia-he requests that we call him at [phone]  Code Status :  DNR-confirmed with spouse  Diet :  Diet Order            Diet Heart Room service appropriate? Yes; Fluid consistency: Thin  Diet effective now               Disposition Plan  :  Remain hospitalized-may require SNF on discharge.  Barriers to discharge: Hypoxia requiring O2 supplementation/complete 5 days of IV Remdesivir  Antimicorbials  :    Anti-infectives (From admission, onward)   Start     Dose/Rate Route Frequency Ordered Stop   08/11/19 1000  remdesivir 100 mg in sodium chloride 0.9 % 100 mL IVPB     100 mg 200 mL/hr over 30 Minutes Intravenous Daily 08/10/19 0703 08/15/19 0959   08/10/19 0800  levofloxacin (LEVAQUIN) IVPB 750 mg  Status:  Discontinued     750 mg 100 mL/hr over 90 Minutes Intravenous Every 24 hours 08/10/19 0711 08/11/19 1222   08/10/19 0715  remdesivir 200 mg in sodium chloride 0.9% 250 mL IVPB     200 mg 580 mL/hr over 30 Minutes Intravenous Once 08/10/19 0703 08/10/19 0929      Inpatient Medications  Scheduled Meds: . sodium chloride   Intravenous Once  . sodium chloride   Intravenous Once  . acetaminophen  650 mg Oral Once  . amLODipine  2.5 mg Oral Daily  . bisacodyl  20 mg Oral Once  . cilostazol  100 mg Oral BID AC  . dexamethasone (DECADRON) injection  6 mg Intravenous Q24H  .  diphenhydrAMINE  25 mg Intravenous Once  . furosemide  20 mg Intravenous Once  . lactulose  20 g Oral BID  . levothyroxine  75 mcg Oral Q0600  . losartan  100 mg Oral Daily  . pantoprazole  40 mg Oral Q0600  . [START ON 08/12/2019] tamsulosin  0.8 mg Oral Daily   Continuous Infusions: . sodium chloride    . remdesivir 100 mg in NS 100 mL 100 mg (08/11/19 1305)   PRN Meds:.acetaminophen **OR** acetaminophen, ondansetron **OR** ondansetron (ZOFRAN) IV   Time Spent in minutes  25   See all Orders from today for further details   Oren Binet M.D on 08/11/2019 at 1:15 PM  To page go to [email or web address] - use universal password  [facility] -  Office  [phone]    Objective:   Vitals:   08/11/19 0107 08/11/19 0315 08/11/19 0600 08/11/19 1208  BP: (!) 104/56 (!) 122/58 (!) 142/67 (!) 96/55  Pulse: 76 79 76 85  Resp: 19 17 18 18   Temp:    98.1 F (36.7 C)  TempSrc:    Oral  SpO2: 93% 97% 97% 92%  Weight:      Height:        Wt Readings from Last 3 Encounters:  08/09/19 65.3 kg  07/12/19 65.3 kg  10/26/18 65.4 kg     Intake/Output Summary (Last 24 hours) at 08/11/2019 1315 Last data filed at 08/11/2019 1037 Gross per 24 hour  Intake 1022.81 ml  Output 2380 ml  Net -1357.19 ml     Physical Exam Gen Exam:Alert awake-not in any distress HEENT:atraumatic, normocephalic Chest: B/L clear to auscultation anteriorly CVS:S1S2 regular Abdomen:soft non tender, non distended Extremities:no edema Neurology: Non focal Skin: no rash   Data Review:    CBC Recent Labs  Lab 08/09/19 1917 08/10/19 0627 08/10/19 1117 08/10/19 2024 08/11/19 0354  WBC 4.9 8.4 8.8  --  4.6  HGB 4.8* 6.6* 6.5* 7.4* 7.0*  HCT 20.4* 25.1* 24.8* 26.6* 25.0*  PLT 285 216 177  --  155  MCV 73.1* 77.5* 78.2*  --  77.6*  MCH 17.2* 20.4* 20.5*  --  21.7*  MCHC 23.5* 26.3* 26.2*  --  28.0*  RDW 18.4* 19.9* 19.5*  --  20.2*  LYMPHSABS 0.9 1.1  --   --   --   MONOABS 0.8 0.3  --   --    --   EOSABS 0.0 0.2  --   --   --   BASOSABS 0.0 0.0  --   --   --     Chemistries  Recent Labs  Lab 08/09/19 1917 08/10/19 0627 08/11/19 0354  NA 138 140 138  K 4.7 4.4 3.4*  CL 98 102 94*  CO2 29 30 34*  GLUCOSE 118* 107* 115*  BUN 31* 26* 16  CREATININE 0.90 0.67 0.67  CALCIUM 8.6* 8.0* 8.5*  AST 129* 146* 59*  ALT 164* 190* 133*  ALKPHOS 82 78 71  BILITOT 0.7 1.3* 0.8   ------------------------------------------------------------------------------------------------------------------ No results for input(s): CHOL, HDL, LDLCALC, TRIG, CHOLHDL, LDLDIRECT in the last 72 hours.  Lab Results  Component Value Date   HGBA1C 5.3 01/22/2018   ------------------------------------------------------------------------------------------------------------------ Recent Labs    08/10/19 0627  TSH 2.158   ------------------------------------------------------------------------------------------------------------------ Recent Labs    08/10/19 0627 08/11/19 0354  VITAMINB12 624  --   FOLATE 17.2  --   FERRITIN 5* 22*  TIBC 510*  --   IRON 246*  --  RETICCTPCT 1.6  --     Coagulation profile Recent Labs  Lab 08/11/19 0354  INR 1.4*    Recent Labs    08/10/19 0747 08/11/19 0354  DDIMER 0.99* 1.44*    Cardiac Enzymes No results for input(s): CKMB, TROPONINI, MYOGLOBIN in the last 168 hours.  Invalid input(s): CK ------------------------------------------------------------------------------------------------------------------    Component Value Date/Time   BNP 827.2 (H) 08/10/2019 [id number]    Micro Results Recent Results (from the past 240 hour(s))  SARS CORONAVIRUS 2 (TAT 6-24 HRS) Nasopharyngeal Nasopharyngeal Swab     Status: Abnormal   Collection Time: 08/09/19  9:39 PM   Specimen: Nasopharyngeal Swab  Result Value Ref Range Status   SARS Coronavirus 2 POSITIVE (A) NEGATIVE Final    Comment: RESULT CALLED TO, READ BACK BY AND VERIFIED WITH: RN JEQUATA  WOODY AT [place] 08/10/2019 (NOTE) SARS-CoV-2 target nucleic acids are DETECTED. The SARS-CoV-2 RNA is generally detectable in upper and lower respiratory specimens during the acute phase of infection. Positive results are indicative of the presence of SARS-CoV-2 RNA. Clinical correlation with patient history and other diagnostic information is  necessary to determine patient infection status. Positive results do not rule out bacterial infection or co-infection with other viruses.  The expected result is Negative. Fact Sheet for Patients: SugarRoll.be Fact Sheet for Healthcare Providers: https://www.woods-mathews.com/ This test is not yet approved or cleared by the [place] FDA and  has been authorized for detection and/or diagnosis of SARS-CoV-2 by FDA under an Emergency Use Authorization (EUA). This EUA will remain  in effect (meaning this t est can be used) for the duration of the COVID-19 declaration under Section 564(b)(1) of the Act, 21 U.S.C. section 360bbb-3(b)(1), unless the authorization is terminated or revoked sooner. Performed at [facility], [place] [address], [place], [place] [address]     Radiology Reports MR BRAIN WO CONTRAST  Result Date: 08/10/2019 CLINICAL DATA:  Initial evaluation for acute ataxia, stroke suspected. EXAM: MRI HEAD WITHOUT CONTRAST TECHNIQUE: Multiplanar, multiecho pulse sequences of the brain and surrounding structures were obtained without intravenous contrast. COMPARISON:  None available. FINDINGS: Brain: Examination degraded by motion artifact. Generalized age-related cerebral atrophy with mild chronic small vessel ischemic disease. There is a small 8 mm focus of restricted diffusion involving the right frontal periventricular white matter adjacent to the right frontal horn, consistent with a small acute ischemic small vessel type infarct (series 2, image 27). No associated  hemorrhage or mass effect. No other abnormal foci of restricted diffusion to suggest acute or subacute ischemia. Gray-white matter differentiation otherwise maintained. No encephalomalacia to suggest chronic cortical infarction. No foci of susceptibility artifact to suggest acute or chronic intracranial hemorrhage. No mass lesion, midline shift or mass effect. No hydrocephalus or extra-axial fluid collection. Pituitary gland and suprasellar region within normal limits. Midline structures intact. Vascular: Major intracranial vascular flow voids are maintained. Skull and upper cervical spine: Craniocervical junction within normal limits. Bone marrow signal intensity normal. No scalp soft tissue abnormality. Sinuses/Orbits: Globes and orbital soft tissues within normal limits. Paranasal sinuses are clear. Small bilateral mastoid effusions noted, of doubtful significance. Trace layering fluid noted layering within the nasopharynx as well. Other: None. IMPRESSION: 1. 8 mm acute ischemic nonhemorrhagic small vessel type infarct involving the right frontal periventricular white matter as above. 2. No other acute intracranial abnormality. 3. Mild age-related cerebral atrophy with chronic small vessel ischemic disease. Electronically Signed   By: Jeannine Boga M.D.   On: 08/10/2019  19:05   CT ABDOMEN PELVIS W CONTRAST  Result Date: 08/10/2019 CLINICAL DATA:  Abdominal pain. EXAM: CT ABDOMEN AND PELVIS WITH CONTRAST TECHNIQUE: Multidetector CT imaging of the abdomen and pelvis was performed using the standard protocol following bolus administration of intravenous contrast. CONTRAST:  15mL OMNIPAQUE IOHEXOL 300 MG/ML  SOLN COMPARISON:  None. FINDINGS: Lower chest: Mild to moderate severity areas of atelectasis and/or infiltrate are seen within the bilateral lung bases. There are small bilateral pleural effusions. Hepatobiliary: No focal liver abnormality is seen. No gallstones, gallbladder wall thickening, or  biliary dilatation. Pancreas: Unremarkable. No pancreatic ductal dilatation or surrounding inflammatory changes. Spleen: Normal in size without focal abnormality. Adrenals/Urinary Tract: Adrenal glands are unremarkable. Kidneys are normal, without renal calculi, focal lesion, or hydronephrosis. Bladder is unremarkable. Stomach/Bowel: Stomach is within normal limits. Appendix appears normal. No evidence of bowel wall thickening, distention, or inflammatory changes. Noninflamed diverticula are seen throughout the descending and sigmoid colon. Vascular/Lymphatic: There is marked severity calcification and tortuosity of the abdominal aorta. 3.7 cm x 3.1 cm aneurysmal dilatation of the infrarenal abdominal aorta is seen. No enlarged abdominal or pelvic lymph nodes. Reproductive: Prostate is unremarkable. Other: No abdominal wall hernia or abnormality. No abdominopelvic ascites. Musculoskeletal: Multilevel degenerative changes seen throughout the lumbar spine. A metallic density compression screw device is seen within the proximal right femur. IMPRESSION: 1. Mild to moderate severity bibasilar atelectasis and/or infiltrate with small bilateral pleural effusions. 2. Noninflamed diverticula throughout the descending and sigmoid colon. 3. Infrarenal abdominal aortic aneurysm measuring 3.7 cm x 3.1 cm. Recommend followup by ultrasound in 3 years. This recommendation follows ACR consensus guidelines: White Paper of the ACR Incidental Findings Committee II on Vascular Findings. J Am Coll Radiol 2013; 10: Aortic Atherosclerosis (ICD10-I70.0). Electronically Signed   By: Virgina Norfolk M.D.   On: 08/10/2019 21:32   DG Chest Port 1 View  Result Date: 08/10/2019 CLINICAL DATA:  Shortness of breath. EXAM: PORTABLE CHEST 1 VIEW COMPARISON:  08/09/2019 FINDINGS: The patient is rotated to the right with grossly unchanged cardiomediastinal silhouette. Aortic atherosclerosis is noted. There is increasing opacity in the right lung  base extending into the right midlung likely reflecting a veiling pleural effusion and airspace consolidation. New mild opacity is present in the left lung base. No pneumothorax is identified. IMPRESSION: 1. Increasing right pleural effusion and right lung consolidation concerning for pneumonia. 2. New mild left basilar atelectasis or pneumonia. Electronically Signed   By: Logan Bores M.D.   On: 08/10/2019 09:40   DG Chest Port 1 View  Result Date: 08/09/2019 CLINICAL DATA:  Cough, progressive weakness, symptoms for 6 weeks EXAM: PORTABLE CHEST 1 VIEW COMPARISON:  12/17/2018 FINDINGS: Single frontal view of the chest demonstrates an unremarkable cardiac silhouette. There is right basilar consolidation and small right pleural effusion. No pneumothorax. Left chest is clear. No acute bony abnormalities. IMPRESSION: 1. Right basilar consolidation and small right pleural effusion. Findings could reflect bronchopneumonia. Followup PA and lateral chest X-ray is recommended in 3-4 weeks following trial of antibiotic therapy to ensure resolution. Electronically Signed   By: Randa Ngo M.D.   On: 08/09/2019 21:31   ECHOCARDIOGRAM LIMITED  Result Date: 08/10/2019    ECHOCARDIOGRAM LIMITED REPORT   Patient Name:   FRANCISCUS ZISK Date of Exam: 08/10/2019 Medical Rec #:  [id number]            Height:       69.0 in Accession #:    [id number]  Weight:       144.0 lb Date of Birth:  01-15-1941            BSA:          1.797 m Patient Age:    60 years             BP:           132/65 mmHg Patient Gender: M                    HR:           89 bpm. Exam Location:  Inpatient Procedure: Limited Echo, Cardiac Doppler and Limited Color Doppler Indications:    Acute Respiratory Insufficiency 518.82 / R06.89  History:        Patient has no prior history of Echocardiogram examinations.                 PAD; Risk Factors:Hypertension, Dyslipidemia and GERD. COVID-19                 Positive. Cancer. Abdominal Aortic  Aneurysm.  Sonographer:    Jonelle Sidle Dance Referring Phys: [place]  1. Left ventricular ejection fraction, by estimation, is 60 to 65%. The left ventricle has normal function. The left ventricle has no regional wall motion abnormalities. Left ventricular diastolic function could not be evaluated.  2. Right ventricular systolic function is normal. The right ventricular size is mildly enlarged. There is moderately elevated pulmonary artery systolic pressure. The estimated right ventricular systolic pressure is Q000111Q mmHg.  3. Left atrial size was moderately dilated.  4. Right atrial size was mildly dilated.  5. The mitral valve is degenerative. Mild mitral valve regurgitation. No evidence of mitral stenosis.  6. The aortic valve has an indeterminant number of cusps. Aortic valve regurgitation is not visualized. Mild to moderate aortic valve stenosis.  7. The inferior vena cava is dilated in size with <50% respiratory variability, suggesting right atrial pressure of 15 mmHg. FINDINGS  Left Ventricle: Left ventricular ejection fraction, by estimation, is 60 to 65%. The left ventricle has normal function. The left ventricle has no regional wall motion abnormalities. The left ventricular internal cavity size was normal in size. There is  no left ventricular hypertrophy. Left ventricular diastolic function could not be evaluated due to mitral annular calcification (moderate or greater). Right Ventricle: The right ventricular size is mildly enlarged. No increase in right ventricular wall thickness. Right ventricular systolic function is normal. There is moderately elevated pulmonary artery systolic pressure. The tricuspid regurgitant velocity is 2.96 m/s, and with an assumed right atrial pressure of 15 mmHg, the estimated right ventricular systolic pressure is Q000111Q mmHg. Left Atrium: Left atrial size was moderately dilated. Right Atrium: Right atrial size was mildly dilated. Mitral Valve: The mitral  valve is degenerative in appearance. Moderate mitral annular calcification. Mild mitral valve regurgitation. No evidence of mitral valve stenosis. Tricuspid Valve: The tricuspid valve is grossly normal. Tricuspid valve regurgitation is mild . No evidence of tricuspid stenosis. Aortic Valve: The aortic valve has an indeterminant number of cusps. . There is severe thickening and severe calcifcation of the aortic valve. Aortic valve regurgitation is not visualized. Mild to moderate aortic stenosis is present. There is severe thickening of the aortic valve. There is severe calcifcation of the aortic valve. Aortic valve mean gradient measures 16.0 mmHg. Aortic valve peak gradient measures 32.3 mmHg. Aortic valve area, by VTI measures 1.57 cm. Pulmonic Valve: The  pulmonic valve was grossly normal. Pulmonic valve regurgitation is not visualized. No evidence of pulmonic stenosis. Aorta: The aortic root is normal in size and structure. Venous: The inferior vena cava is dilated in size with less than 50% respiratory variability, suggesting right atrial pressure of 15 mmHg. IAS/Shunts: No atrial level shunt detected by color flow Doppler.  LEFT VENTRICLE PLAX 2D LVIDd:         4.81 cm LVIDs:         4.02 cm LV PW:         1.12 cm LV IVS:        0.95 cm LVOT diam:     2.00 cm LV SV:         86 LV SV Index:   48 LVOT Area:     3.14 cm  RIGHT VENTRICLE          IVC RV Basal diam:  2.72 cm  IVC diam: 2.58 cm LEFT ATRIUM              Index       RIGHT ATRIUM           Index LA diam:        5.00 cm  2.78 cm/m  RA Area:     21.60 cm LA Vol (A2C):   116.0 ml 64.57 ml/m RA Volume:   66.30 ml  36.90 ml/m LA Vol (A4C):   78.6 ml  43.75 ml/m LA Biplane Vol: 94.7 ml  52.71 ml/m  AORTIC VALVE AV Area (Vmax):    1.56 cm AV Area (Vmean):   1.47 cm AV Area (VTI):     1.57 cm AV Vmax:           284.00 cm/s AV Vmean:          173.000 cm/s AV VTI:            0.548 m AV Peak Grad:      32.3 mmHg AV Mean Grad:      16.0 mmHg LVOT Vmax:          141.00 cm/s LVOT Vmean:        80.700 cm/s LVOT VTI:          0.274 m LVOT/AV VTI ratio: 0.50  AORTA Ao Root diam: 3.80 cm Ao Asc diam:  3.30 cm MITRAL VALVE                TRICUSPID VALVE MV Area (PHT): 3.53 cm     TR Peak grad:   35.0 mmHg MV Decel Time: 215 msec     TR Vmax:        296.00 cm/s MV E velocity: 134.00 cm/s MV A velocity: 167.00 cm/s  SHUNTS MV E/A ratio:  0.80         Systemic VTI:  0.27 m                             Systemic Diam: 2.00 cm Eleonore Chiquito MD Electronically signed by Eleonore Chiquito MD Signature Date/Time: 08/10/2019/2:02:07 PM    Final

## 2019-08-12 ENCOUNTER — Inpatient Hospital Stay (HOSPITAL_COMMUNITY): Payer: Medicare Other

## 2019-08-12 ENCOUNTER — Encounter (HOSPITAL_COMMUNITY): Payer: Self-pay | Admitting: Internal Medicine

## 2019-08-12 DIAGNOSIS — I6330 Cerebral infarction due to thrombosis of unspecified cerebral artery: Secondary | ICD-10-CM | POA: Insufficient documentation

## 2019-08-12 LAB — BPAM RBC
Blood Product Expiration Date: 202104142359
Blood Product Expiration Date: 202104142359
Blood Product Expiration Date: 202104202359
Blood Product Expiration Date: 202104252359
ISSUE DATE / TIME: 202103222128
ISSUE DATE / TIME: 202103222349
ISSUE DATE / TIME: 202103231535
ISSUE DATE / TIME: 202103241339
Unit Type and Rh: 6200
Unit Type and Rh: 8400
Unit Type and Rh: 8400
Unit Type and Rh: 8400

## 2019-08-12 LAB — TYPE AND SCREEN
ABO/RH(D): AB POS
Antibody Screen: NEGATIVE
Unit division: 0
Unit division: 0
Unit division: 0
Unit division: 0

## 2019-08-12 LAB — LIPID PANEL
Cholesterol: 75 mg/dL (ref 0–200)
HDL: 23 mg/dL — ABNORMAL LOW (ref >40)
LDL Cholesterol: 40 mg/dL (ref 0–99)
Total CHOL/HDL Ratio: 3.3 RATIO
Triglycerides: 62 mg/dL (ref <150)
VLDL: 12 mg/dL (ref 0–40)

## 2019-08-12 LAB — CBC
HCT: 29.7 % — ABNORMAL LOW (ref 39.0–52.0)
Hemoglobin: 8.5 g/dL — ABNORMAL LOW (ref 13.0–17.0)
MCH: 22.5 pg — ABNORMAL LOW (ref 26.0–34.0)
MCHC: 28.6 g/dL — ABNORMAL LOW (ref 30.0–36.0)
MCV: 78.6 fL — ABNORMAL LOW (ref 80.0–100.0)
Platelets: 158 10*3/uL (ref 150–400)
RBC: 3.78 MIL/uL — ABNORMAL LOW (ref 4.22–5.81)
RDW: 21.8 % — ABNORMAL HIGH (ref 11.5–15.5)
WBC: 6.4 10*3/uL (ref 4.0–10.5)
nRBC: 0.6 % — ABNORMAL HIGH (ref 0.0–0.2)

## 2019-08-12 LAB — COMPREHENSIVE METABOLIC PANEL
ALT: 111 U/L — ABNORMAL HIGH (ref 0–44)
AST: 39 U/L (ref 15–41)
Albumin: 3 g/dL — ABNORMAL LOW (ref 3.5–5.0)
Alkaline Phosphatase: 67 U/L (ref 38–126)
Anion gap: 9 (ref 5–15)
BUN: 16 mg/dL (ref 8–23)
CO2: 35 mmol/L — ABNORMAL HIGH (ref 22–32)
Calcium: 8.5 mg/dL — ABNORMAL LOW (ref 8.9–10.3)
Chloride: 89 mmol/L — ABNORMAL LOW (ref 98–111)
Creatinine, Ser: 0.88 mg/dL (ref 0.61–1.24)
GFR calc Af Amer: >60 mL/min (ref >60)
GFR calc non Af Amer: >60 mL/min (ref >60)
Glucose, Bld: 116 mg/dL — ABNORMAL HIGH (ref 70–99)
Potassium: 3.8 mmol/L (ref 3.5–5.1)
Sodium: 133 mmol/L — ABNORMAL LOW (ref 135–145)
Total Bilirubin: 1.1 mg/dL (ref 0.3–1.2)
Total Protein: 5 g/dL — ABNORMAL LOW (ref 6.5–8.1)

## 2019-08-12 LAB — FERRITIN: Ferritin: 84 ng/mL (ref 24–336)

## 2019-08-12 LAB — HEPATITIS C ANTIBODY: HCV Ab: NONREACTIVE

## 2019-08-12 LAB — C-REACTIVE PROTEIN: CRP: <0.5 mg/dL (ref <1.0)

## 2019-08-12 LAB — HEMOGLOBIN A1C
Hgb A1c MFr Bld: 5.7 % — ABNORMAL HIGH (ref 4.8–5.6)
Mean Plasma Glucose: 116.89 mg/dL

## 2019-08-12 LAB — HEPATITIS B SURFACE ANTIGEN: Hepatitis B Surface Ag: NONREACTIVE

## 2019-08-12 LAB — HEPATITIS B SURFACE ANTIBODY,QUALITATIVE: Hep B S Ab: NONREACTIVE

## 2019-08-12 LAB — D-DIMER, QUANTITATIVE: D-Dimer, Quant: 1.62 ug/mL-FEU — ABNORMAL HIGH (ref 0.00–0.50)

## 2019-08-12 MED ORDER — PANTOPRAZOLE SODIUM 40 MG PO TBEC
40.0000 mg | DELAYED_RELEASE_TABLET | Freq: Two times a day (BID) | ORAL | Status: DC
  Administered 2019-08-12 – 2019-08-15 (×6): 40 mg via ORAL
  Filled 2019-08-12 (×6): qty 1

## 2019-08-12 MED ORDER — IOHEXOL 350 MG/ML SOLN
80.0000 mL | Freq: Once | INTRAVENOUS | Status: AC | PRN
  Administered 2019-08-12: 80 mL via INTRAVENOUS

## 2019-08-12 MED ORDER — CLOPIDOGREL BISULFATE 75 MG PO TABS
75.0000 mg | ORAL_TABLET | Freq: Every day | ORAL | Status: DC
  Administered 2019-08-12 – 2019-08-15 (×4): 75 mg via ORAL
  Filled 2019-08-12 (×4): qty 1

## 2019-08-12 MED ORDER — FINASTERIDE 5 MG PO TABS
5.0000 mg | ORAL_TABLET | Freq: Every day | ORAL | Status: DC
  Administered 2019-08-12 – 2019-08-15 (×4): 5 mg via ORAL
  Filled 2019-08-12 (×4): qty 1

## 2019-08-12 NOTE — Progress Notes (Signed)
PROGRESS NOTE                                                                                                                                                                                                             Patient Demographics:    Ronnie Snyder, is a 79 y.o. male, DOB - [birth year], [id number]  Outpatient Primary MD for the patient is Luetta Nutting, DO   Admit date - 08/09/2019   LOS - 2  No chief complaint on file.      Brief Narrative: Patient is a 79 y.o. male with PMHx of PAD, AAA hypothyroidism, HTN-who presented with weakness and confusion-was found to have a hemoglobin of 4.8, elevated liver enzymes-and acute hypoxic respiratory failure requiring oxygen supplementation.  He received 2 units of PRBC-and was subsequently admitted to the hospitalist service.  Subsequently his Covid PCR came back positive (vaccinated-second shot earlier this month).  See below for further details.  Significant Events: 3/22>> admit for severe anemia likely from recent GI bleeding, hypoxemia-Covid positive and confusion. 3/23>> MRI brain-small right frontal infarct   COVID-19 medications: Steroids: 3/23>>3/25 Remdesivir: 3/23>>  Antibiotics: Levofloxacin: 3/23>>3/24  Microbiology data: 3/22>> Covid positive  DVT prophylaxis: SCDs  Procedures: None  Consults: Gastroenterology   Subjective:   Stable-no major events overnight.   Assessment  & Plan :   Acute Hypoxic Resp Failure: Etiology not certain-although he is COVID-19 positive-CRP not significantly elevated to suggest significant infection/inflammation.  Furthermore-per patient's son-patient got his second shot of vaccine earlier this month.  Procalcitonin not elevated-no white count or fever to suggest bacterial pneumonia.  Suspect could have had pulmonary edema/decompensated diastolic heart failure-and has improved rapidly with diuresis-now on just  1 L of oxygen/room air (was on NRB on 3/23).  Stop steroids today-continue with remdesivir for a few more days.  Remains on oral Lasix with stable volume status and renal function.  Fever: afebrile  O2 requirements:  SpO2: 90 % O2 Flow Rate (L/min): 2 L/min   COVID-19 Labs: Recent Labs    08/10/19 0627 08/10/19 0747 08/11/19 0354 08/12/19 0412  DDIMER  --  0.99* 1.44* 1.62*  FERRITIN 5*  --  22* 84  CRP  --  0.6 0.6 <0.5       Component Value Date/Time   BNP 827.2 (H) 08/10/2019 0627    Recent Labs  Lab 08/10/19 0747  PROCALCITON <0.10    Lab Results  Component Value Date   SARSCOV2NAA POSITIVE (A) 08/09/2019     Prone/Incentive Spirometry: encouraged  incentive spirometry use 3-4/hour.  DVT Prophylaxis  :  SCDs   Transaminitis: Likely from Covid infection-or shock liver due to severe anemia.  Improving-LFTs downtrending.  Await hepatitis B and hepatitis C serology.  Recent lower GI bleeding with acute blood loss anemia: Acknowledges melanotic stools last week-is microcytic-and iron deficient.  Hemoglobin now stable after 4 units of PRBC transfusion.  Also received 1 dose of IV iron.  GI recommending outpatient endoscopic evaluation.  Maintain on PPI.  Acute metabolic encephalopathy: Suspect secondary to hypoxemia-possible Covid/bacterial pneumonia.  He was very lethargic on 3/23-where he is now completely awake and alert and is following all commands. .    Acute right frontal lobe infarct: See above-likely incidental.  Neurology following-discussed with GI MD on 3/25-okay to start antiplatelet agents as long as patient on PPI twice daily.  Will restart Plavix.  HTN: Blood pressure reasonably well controlled-continue losartan and amlodipine.  PAD: Stable-given recent history of GI bleeding and severity of anemia-Plavix was held-we will resume today-see above regarding discussion with GI MD.  Remains on cilostazol.  Hypothyroidism: Continue with Synthroid  BPH:  Per nursing staff-continues to have around 300-400 cc of residual urine-but able to urinate.  Patient not keen on placing a Foley catheter.  Flomax has been increased to 0.8 mg-add finasteride.  Continue to follow with serial bladder scans.     ABG:    Component Value Date/Time   TCO2 27 10/13/2012 1041    Vent Settings: N/A   Condition -  Guarded  Family Communication  :  Spouse updated over the phone 3/24-but per son -spouse has dementia-he requests that we call him at [phone] son on 3/25  Code Status :  DNR-confirmed with spouse/son  Diet :  Diet Order            Diet Heart Room service appropriate? Yes; Fluid consistency: Thin  Diet effective now               Disposition Plan  :  Remain hospitalized-may require SNF on discharge home health on discharge-await PT eval  Barriers to discharge: Hypoxia requiring O2 supplementation/complete 5 days of IV Remdesivir  Antimicorbials  :    Anti-infectives (From admission, onward)   Start     Dose/Rate Route Frequency Ordered Stop   08/11/19 1000  remdesivir 100 mg in sodium chloride 0.9 % 100 mL IVPB     100 mg 200 mL/hr over 30 Minutes Intravenous Daily 08/10/19 0703 08/15/19 0959   08/10/19 0800  levofloxacin (LEVAQUIN) IVPB 750 mg  Status:  Discontinued     750 mg 100 mL/hr over 90 Minutes Intravenous Every 24 hours 08/10/19 0711 08/11/19 1222   08/10/19 0715  remdesivir 200 mg in sodium chloride 0.9% 250 mL IVPB     200 mg 580 mL/hr over 30 Minutes Intravenous Once 08/10/19 0703 08/10/19 0929      Inpatient Medications  Scheduled Meds: . sodium chloride   Intravenous Once  . bisacodyl  20 mg Oral Once  . cilostazol  100 mg Oral BID AC  . dexamethasone (DECADRON) injection  6 mg Intravenous Q24H  . furosemide  40 mg Oral Daily  . lactulose  20 g Oral BID  . levothyroxine  75 mcg Oral Q0600  . pantoprazole  40 mg Oral Q0600  . tamsulosin  0.8  mg Oral Daily   Continuous Infusions: . sodium chloride     . remdesivir 100 mg in NS 100 mL 100 mg (08/12/19 0936)   PRN Meds:.acetaminophen **OR** acetaminophen, ondansetron **OR** ondansetron (ZOFRAN) IV   Time Spent in minutes  25   See all Orders from today for further details   Oren Binet M.D on 08/12/2019 at 11:09 AM  To page go to [email or web address] - use universal password  [facility] -  Office  [phone]    Objective:   Vitals:   08/11/19 2107 08/11/19 2200 08/11/19 2340 08/12/19 0415  BP: (!) 97/46 (!) 109/52 124/68 136/78  Pulse: 77 80 80 91  Resp: (!) 25 (!) 26 14 16   Temp: 98.8 F (37.1 C)     TempSrc: Oral     SpO2: 92% 91% 94% 90%  Weight:      Height:        Wt Readings from Last 3 Encounters:  08/09/19 65.3 kg  07/12/19 65.3 kg  10/26/18 65.4 kg     Intake/Output Summary (Last 24 hours) at 08/12/2019 1109 Last data filed at 08/12/2019 0925 Gross per 24 hour  Intake 1756 ml  Output 1855 ml  Net -99 ml     Physical Exam Gen Exam:Alert awake-not in any distress HEENT:atraumatic, normocephalic Chest: B/L clear to auscultation anteriorly CVS:S1S2 regular Abdomen:soft non tender, non distended Extremities:no edema Neurology: Non focal Skin: no rash   Data Review:    CBC Recent Labs  Lab 08/09/19 1917 08/09/19 1917 08/10/19 0627 08/10/19 0627 08/10/19 1117 08/10/19 2024 08/11/19 0354 08/11/19 2041 08/12/19 0412  WBC 4.9  --  8.4  --  8.8  --  4.6  --  6.4  HGB 4.8*   < > 6.6*   < > 6.5* 7.4* 7.0* 8.2* 8.5*  HCT 20.4*   < > 25.1*   < > 24.8* 26.6* 25.0* 28.0* 29.7*  PLT 285  --  216  --  177  --  155  --  158  MCV 73.1*  --  77.5*  --  78.2*  --  77.6*  --  78.6*  MCH 17.2*  --  20.4*  --  20.5*  --  21.7*  --  22.5*  MCHC 23.5*  --  26.3*  --  26.2*  --  28.0*  --  28.6*  RDW 18.4*  --  19.9*  --  19.5*  --  20.2*  --  21.8*  LYMPHSABS 0.9  --  1.1  --   --   --   --   --   --   MONOABS 0.8  --  0.3  --   --   --   --   --   --   EOSABS 0.0  --  0.2  --   --   --   --   --    --   BASOSABS 0.0  --  0.0  --   --   --   --   --   --    < > = values in this interval not displayed.    Chemistries  Recent Labs  Lab 08/09/19 1917 08/10/19 0627 08/11/19 0354 08/12/19 0412  NA 138 140 138 133*  K 4.7 4.4 3.4* 3.8  CL 98 102 94* 89*  CO2 29 30 34* 35*  GLUCOSE 118* 107* 115* 116*  BUN 31* 26* 16 16  CREATININE 0.90 0.67 0.67 0.88  CALCIUM 8.6* 8.0* 8.5* 8.5*  AST 129* 146* 59* 39  ALT 164* 190* 133* 111*  ALKPHOS 82 78 71 67  BILITOT 0.7 1.3* 0.8 1.1   ------------------------------------------------------------------------------------------------------------------ Recent Labs    08/12/19 0412  CHOL 75  HDL 23*  LDLCALC 40  TRIG 62  CHOLHDL 3.3    Lab Results  Component Value Date   HGBA1C 5.7 (H) 08/12/2019   ------------------------------------------------------------------------------------------------------------------ Recent Labs    08/10/19 0627  TSH 2.158   ------------------------------------------------------------------------------------------------------------------ Recent Labs    08/10/19 [id number] 08/10/19 [id number] 08/11/19 0354 08/12/19 0412  VITAMINB12 624  --   --   --   FOLATE 17.2  --   --   --   FERRITIN 5*   < > 22* 84  TIBC 510*  --   --   --   IRON 246*  --   --   --   RETICCTPCT 1.6  --   --   --    < > = values in this interval not displayed.    Coagulation profile Recent Labs  Lab 08/11/19 0354  INR 1.4*    Recent Labs    08/11/19 0354 08/12/19 0412  DDIMER 1.44* 1.62*    Cardiac Enzymes No results for input(s): CKMB, TROPONINI, MYOGLOBIN in the last 168 hours.  Invalid input(s): CK ------------------------------------------------------------------------------------------------------------------    Component Value Date/Time   BNP 827.2 (H) 08/10/2019 [id number]    Micro Results Recent Results (from the past 240 hour(s))  SARS CORONAVIRUS 2 (TAT 6-24 HRS) Nasopharyngeal Nasopharyngeal Swab      Status: Abnormal   Collection Time: 08/09/19  9:39 PM   Specimen: Nasopharyngeal Swab  Result Value Ref Range Status   SARS Coronavirus 2 POSITIVE (A) NEGATIVE Final    Comment: RESULT CALLED TO, READ BACK BY AND VERIFIED WITH: RN JEQUATA WOODY AT [place] 08/10/2019 (NOTE) SARS-CoV-2 target nucleic acids are DETECTED. The SARS-CoV-2 RNA is generally detectable in upper and lower respiratory specimens during the acute phase of infection. Positive results are indicative of the presence of SARS-CoV-2 RNA. Clinical correlation with patient history and other diagnostic information is  necessary to determine patient infection status. Positive results do not rule out bacterial infection or co-infection with other viruses.  The expected result is Negative. Fact Sheet for Patients: SugarRoll.be Fact Sheet for Healthcare Providers: https://www.woods-mathews.com/ This test is not yet approved or cleared by the [place] FDA and  has been authorized for detection and/or diagnosis of SARS-CoV-2 by FDA under an Emergency Use Authorization (EUA). This EUA will remain  in effect (meaning this t est can be used) for the duration of the COVID-19 declaration under Section 564(b)(1) of the Act, 21 U.S.C. section 360bbb-3(b)(1), unless the authorization is terminated or revoked sooner. Performed at [facility], [place] [address] [place], [place] [address]     Radiology Reports CT ANGIO HEAD W OR WO CONTRAST  Result Date: 08/12/2019 CLINICAL DATA:  Stroke follow-up EXAM: CT ANGIOGRAPHY HEAD AND NECK TECHNIQUE: Multidetector CT imaging of the head and neck was performed using the standard protocol during bolus administration of intravenous contrast. Multiplanar CT image reconstructions and MIPs were obtained to evaluate the vascular anatomy. Carotid stenosis measurements (when applicable) are obtained utilizing NASCET criteria, using  the distal internal carotid diameter as the denominator. CONTRAST:  22mL OMNIPAQUE IOHEXOL 350 MG/ML SOLN COMPARISON:  Brain MRI from 2 days ago FINDINGS: CT HEAD FINDINGS Brain: Patient's acute infarct is too small for visualization on this study. No evidence of  interval infarct or hemorrhage. No hydrocephalus or collection. Vascular: See below. Skull: Negative Sinuses: Negative Orbits: Negative Review of the MIP images confirms the above findings CTA NECK FINDINGS Aortic arch: Atherosclerotic plaque. The great vessel origins are incompletely covered. Right carotid system: Brachiocephalic atherosclerosis without stenosis. Heavily calcified ICA bulb without ulceration or flow limiting stenosis. There is subjectively moderate ECA origin stenosis. Negative for vessel beading. Left carotid system: Bulky atherosclerotic plaque at the bulb with 70% luminal stenosis. No dissection or beading seen distally. Vertebral arteries: Proximal subclavian atherosclerosis without flow limiting stenosis. Robust flow in the bilateral vertebral arteries. Skeleton: No acute or aggressive finding. Multilevel thoracic ankylosis from bridging osteophytes or syndesmophytes. Other neck: No evidence of mass or inflammation. Upper chest: Mucus/debris in the lower trachea. Review of the MIP images confirms the above findings CTA HEAD FINDINGS Anterior circulation: Heavily calcified carotid siphons with 55% stenosis at the paraclinoid segment on the right. No branch occlusion, beading, or aneurysm. There is atherosclerotic irregularity of medium size vessels. Posterior circulation: V4 segment calcified plaque without flow limiting stenosis. The basilar is smooth and widely patent. Hypoplastic right P1 segment. No branch occlusion, beading, or aneurysm. Venous sinuses: Diffusely patent Anatomic variants: As above Review of the MIP images confirms the above findings IMPRESSION: 1. No emergent finding. 2. Advanced cervical carotid atherosclerosis  with up to 70% stenosis at the left bulb. 3. 55% stenosis at the right paraclinoid ICA. Electronically Signed   By: Monte Fantasia M.D.   On: 08/12/2019 06:24   CT ANGIO NECK W OR WO CONTRAST  Result Date: 08/12/2019 CLINICAL DATA:  Stroke follow-up EXAM: CT ANGIOGRAPHY HEAD AND NECK TECHNIQUE: Multidetector CT imaging of the head and neck was performed using the standard protocol during bolus administration of intravenous contrast. Multiplanar CT image reconstructions and MIPs were obtained to evaluate the vascular anatomy. Carotid stenosis measurements (when applicable) are obtained utilizing NASCET criteria, using the distal internal carotid diameter as the denominator. CONTRAST:  36mL OMNIPAQUE IOHEXOL 350 MG/ML SOLN COMPARISON:  Brain MRI from 2 days ago FINDINGS: CT HEAD FINDINGS Brain: Patient's acute infarct is too small for visualization on this study. No evidence of interval infarct or hemorrhage. No hydrocephalus or collection. Vascular: See below. Skull: Negative Sinuses: Negative Orbits: Negative Review of the MIP images confirms the above findings CTA NECK FINDINGS Aortic arch: Atherosclerotic plaque. The great vessel origins are incompletely covered. Right carotid system: Brachiocephalic atherosclerosis without stenosis. Heavily calcified ICA bulb without ulceration or flow limiting stenosis. There is subjectively moderate ECA origin stenosis. Negative for vessel beading. Left carotid system: Bulky atherosclerotic plaque at the bulb with 70% luminal stenosis. No dissection or beading seen distally. Vertebral arteries: Proximal subclavian atherosclerosis without flow limiting stenosis. Robust flow in the bilateral vertebral arteries. Skeleton: No acute or aggressive finding. Multilevel thoracic ankylosis from bridging osteophytes or syndesmophytes. Other neck: No evidence of mass or inflammation. Upper chest: Mucus/debris in the lower trachea. Review of the MIP images confirms the above findings  CTA HEAD FINDINGS Anterior circulation: Heavily calcified carotid siphons with 55% stenosis at the paraclinoid segment on the right. No branch occlusion, beading, or aneurysm. There is atherosclerotic irregularity of medium size vessels. Posterior circulation: V4 segment calcified plaque without flow limiting stenosis. The basilar is smooth and widely patent. Hypoplastic right P1 segment. No branch occlusion, beading, or aneurysm. Venous sinuses: Diffusely patent Anatomic variants: As above Review of the MIP images confirms the above findings IMPRESSION: 1. No emergent finding. 2. Advanced cervical carotid  atherosclerosis with up to 70% stenosis at the left bulb. 3. 55% stenosis at the right paraclinoid ICA. Electronically Signed   By: Monte Fantasia M.D.   On: 08/12/2019 06:24   MR BRAIN WO CONTRAST  Result Date: 08/10/2019 CLINICAL DATA:  Initial evaluation for acute ataxia, stroke suspected. EXAM: MRI HEAD WITHOUT CONTRAST TECHNIQUE: Multiplanar, multiecho pulse sequences of the brain and surrounding structures were obtained without intravenous contrast. COMPARISON:  None available. FINDINGS: Brain: Examination degraded by motion artifact. Generalized age-related cerebral atrophy with mild chronic small vessel ischemic disease. There is a small 8 mm focus of restricted diffusion involving the right frontal periventricular white matter adjacent to the right frontal horn, consistent with a small acute ischemic small vessel type infarct (series 2, image 27). No associated hemorrhage or mass effect. No other abnormal foci of restricted diffusion to suggest acute or subacute ischemia. Gray-white matter differentiation otherwise maintained. No encephalomalacia to suggest chronic cortical infarction. No foci of susceptibility artifact to suggest acute or chronic intracranial hemorrhage. No mass lesion, midline shift or mass effect. No hydrocephalus or extra-axial fluid collection. Pituitary gland and suprasellar  region within normal limits. Midline structures intact. Vascular: Major intracranial vascular flow voids are maintained. Skull and upper cervical spine: Craniocervical junction within normal limits. Bone marrow signal intensity normal. No scalp soft tissue abnormality. Sinuses/Orbits: Globes and orbital soft tissues within normal limits. Paranasal sinuses are clear. Small bilateral mastoid effusions noted, of doubtful significance. Trace layering fluid noted layering within the nasopharynx as well. Other: None. IMPRESSION: 1. 8 mm acute ischemic nonhemorrhagic small vessel type infarct involving the right frontal periventricular white matter as above. 2. No other acute intracranial abnormality. 3. Mild age-related cerebral atrophy with chronic small vessel ischemic disease. Electronically Signed   By: Jeannine Boga M.D.   On: 08/10/2019 19:05   CT ABDOMEN PELVIS W CONTRAST  Result Date: 08/10/2019 CLINICAL DATA:  Abdominal pain. EXAM: CT ABDOMEN AND PELVIS WITH CONTRAST TECHNIQUE: Multidetector CT imaging of the abdomen and pelvis was performed using the standard protocol following bolus administration of intravenous contrast. CONTRAST:  131mL OMNIPAQUE IOHEXOL 300 MG/ML  SOLN COMPARISON:  None. FINDINGS: Lower chest: Mild to moderate severity areas of atelectasis and/or infiltrate are seen within the bilateral lung bases. There are small bilateral pleural effusions. Hepatobiliary: No focal liver abnormality is seen. No gallstones, gallbladder wall thickening, or biliary dilatation. Pancreas: Unremarkable. No pancreatic ductal dilatation or surrounding inflammatory changes. Spleen: Normal in size without focal abnormality. Adrenals/Urinary Tract: Adrenal glands are unremarkable. Kidneys are normal, without renal calculi, focal lesion, or hydronephrosis. Bladder is unremarkable. Stomach/Bowel: Stomach is within normal limits. Appendix appears normal. No evidence of bowel wall thickening, distention, or  inflammatory changes. Noninflamed diverticula are seen throughout the descending and sigmoid colon. Vascular/Lymphatic: There is marked severity calcification and tortuosity of the abdominal aorta. 3.7 cm x 3.1 cm aneurysmal dilatation of the infrarenal abdominal aorta is seen. No enlarged abdominal or pelvic lymph nodes. Reproductive: Prostate is unremarkable. Other: No abdominal wall hernia or abnormality. No abdominopelvic ascites. Musculoskeletal: Multilevel degenerative changes seen throughout the lumbar spine. A metallic density compression screw device is seen within the proximal right femur. IMPRESSION: 1. Mild to moderate severity bibasilar atelectasis and/or infiltrate with small bilateral pleural effusions. 2. Noninflamed diverticula throughout the descending and sigmoid colon. 3. Infrarenal abdominal aortic aneurysm measuring 3.7 cm x 3.1 cm. Recommend followup by ultrasound in 3 years. This recommendation follows ACR consensus guidelines: White Paper of the ACR Incidental Findings Committee  II on Vascular Findings. J Am Coll Radiol 2013; 10: Aortic Atherosclerosis (ICD10-I70.0). Electronically Signed   By: Virgina Norfolk M.D.   On: 08/10/2019 21:32   DG Chest Port 1 View  Result Date: 08/10/2019 CLINICAL DATA:  Shortness of breath. EXAM: PORTABLE CHEST 1 VIEW COMPARISON:  08/09/2019 FINDINGS: The patient is rotated to the right with grossly unchanged cardiomediastinal silhouette. Aortic atherosclerosis is noted. There is increasing opacity in the right lung base extending into the right midlung likely reflecting a veiling pleural effusion and airspace consolidation. New mild opacity is present in the left lung base. No pneumothorax is identified. IMPRESSION: 1. Increasing right pleural effusion and right lung consolidation concerning for pneumonia. 2. New mild left basilar atelectasis or pneumonia. Electronically Signed   By: Logan Bores M.D.   On: 08/10/2019 09:40   DG Chest Port 1  View  Result Date: 08/09/2019 CLINICAL DATA:  Cough, progressive weakness, symptoms for 6 weeks EXAM: PORTABLE CHEST 1 VIEW COMPARISON:  12/17/2018 FINDINGS: Single frontal view of the chest demonstrates an unremarkable cardiac silhouette. There is right basilar consolidation and small right pleural effusion. No pneumothorax. Left chest is clear. No acute bony abnormalities. IMPRESSION: 1. Right basilar consolidation and small right pleural effusion. Findings could reflect bronchopneumonia. Followup PA and lateral chest X-ray is recommended in 3-4 weeks following trial of antibiotic therapy to ensure resolution. Electronically Signed   By: Randa Ngo M.D.   On: 08/09/2019 21:31   ECHOCARDIOGRAM LIMITED  Result Date: 08/10/2019    ECHOCARDIOGRAM LIMITED REPORT   Patient Name:   AUTHOR SLAVEN Date of Exam: 08/10/2019 Medical Rec #:  [id number]            Height:       69.0 in Accession #:    [id number]           Weight:       144.0 lb Date of Birth:  07-27-1940            BSA:          1.797 m Patient Age:    69 years             BP:           132/65 mmHg Patient Gender: M                    HR:           89 bpm. Exam Location:  Inpatient Procedure: Limited Echo, Cardiac Doppler and Limited Color Doppler Indications:    Acute Respiratory Insufficiency 518.82 / R06.89  History:        Patient has no prior history of Echocardiogram examinations.                 PAD; Risk Factors:Hypertension, Dyslipidemia and GERD. COVID-19                 Positive. Cancer. Abdominal Aortic Aneurysm.  Sonographer:    Jonelle Sidle Dance Referring Phys: [place]  1. Left ventricular ejection fraction, by estimation, is 60 to 65%. The left ventricle has normal function. The left ventricle has no regional wall motion abnormalities. Left ventricular diastolic function could not be evaluated.  2. Right ventricular systolic function is normal. The right ventricular size is mildly enlarged. There is  moderately elevated pulmonary artery systolic pressure. The estimated right ventricular systolic pressure is [id number] mmHg.  3. Left atrial size was moderately dilated.  4. Right atrial  size was mildly dilated.  5. The mitral valve is degenerative. Mild mitral valve regurgitation. No evidence of mitral stenosis.  6. The aortic valve has an indeterminant number of cusps. Aortic valve regurgitation is not visualized. Mild to moderate aortic valve stenosis.  7. The inferior vena cava is dilated in size with <50% respiratory variability, suggesting right atrial pressure of 15 mmHg. FINDINGS  Left Ventricle: Left ventricular ejection fraction, by estimation, is 60 to 65%. The left ventricle has normal function. The left ventricle has no regional wall motion abnormalities. The left ventricular internal cavity size was normal in size. There is  no left ventricular hypertrophy. Left ventricular diastolic function could not be evaluated due to mitral annular calcification (moderate or greater). Right Ventricle: The right ventricular size is mildly enlarged. No increase in right ventricular wall thickness. Right ventricular systolic function is normal. There is moderately elevated pulmonary artery systolic pressure. The tricuspid regurgitant velocity is 2.96 m/s, and with an assumed right atrial pressure of 15 mmHg, the estimated right ventricular systolic pressure is [id number] mmHg. Left Atrium: Left atrial size was moderately dilated. Right Atrium: Right atrial size was mildly dilated. Mitral Valve: The mitral valve is degenerative in appearance. Moderate mitral annular calcification. Mild mitral valve regurgitation. No evidence of mitral valve stenosis. Tricuspid Valve: The tricuspid valve is grossly normal. Tricuspid valve regurgitation is mild . No evidence of tricuspid stenosis. Aortic Valve: The aortic valve has an indeterminant number of cusps. . There is severe thickening and severe calcifcation of the aortic valve. Aortic  valve regurgitation is not visualized. Mild to moderate aortic stenosis is present. There is severe thickening of the aortic valve. There is severe calcifcation of the aortic valve. Aortic valve mean gradient measures 16.0 mmHg. Aortic valve peak gradient measures 32.3 mmHg. Aortic valve area, by VTI measures 1.57 cm. Pulmonic Valve: The pulmonic valve was grossly normal. Pulmonic valve regurgitation is not visualized. No evidence of pulmonic stenosis. Aorta: The aortic root is normal in size and structure. Venous: The inferior vena cava is dilated in size with less than 50% respiratory variability, suggesting right atrial pressure of 15 mmHg. IAS/Shunts: No atrial level shunt detected by color flow Doppler.  LEFT VENTRICLE PLAX 2D LVIDd:         4.81 cm LVIDs:         4.02 cm LV PW:         1.12 cm LV IVS:        0.95 cm LVOT diam:     2.00 cm LV SV:         86 LV SV Index:   48 LVOT Area:     3.14 cm  RIGHT VENTRICLE          IVC RV Basal diam:  2.72 cm  IVC diam: 2.58 cm LEFT ATRIUM              Index       RIGHT ATRIUM           Index LA diam:        5.00 cm  2.78 cm/m  RA Area:     21.60 cm LA Vol (A2C):   116.0 ml 64.57 ml/m RA Volume:   66.30 ml  36.90 ml/m LA Vol (A4C):   78.6 ml  43.75 ml/m LA Biplane Vol: 94.7 ml  52.71 ml/m  AORTIC VALVE AV Area (Vmax):    1.56 cm AV Area (Vmean):   1.47 cm AV Area (VTI):  1.57 cm AV Vmax:           284.00 cm/s AV Vmean:          173.000 cm/s AV VTI:            0.548 m AV Peak Grad:      32.3 mmHg AV Mean Grad:      16.0 mmHg LVOT Vmax:         141.00 cm/s LVOT Vmean:        80.700 cm/s LVOT VTI:          0.274 m LVOT/AV VTI ratio: 0.50  AORTA Ao Root diam: 3.80 cm Ao Asc diam:  3.30 cm MITRAL VALVE                TRICUSPID VALVE MV Area (PHT): 3.53 cm     TR Peak grad:   35.0 mmHg MV Decel Time: 215 msec     TR Vmax:        296.00 cm/s MV E velocity: 134.00 cm/s MV A velocity: 167.00 cm/s  SHUNTS MV E/A ratio:  0.80         Systemic VTI:  0.27 m                              Systemic Diam: 2.00 cm Eleonore Chiquito MD Electronically signed by Eleonore Chiquito MD Signature Date/Time: 08/10/2019/2:02:07 PM    Final

## 2019-08-12 NOTE — Plan of Care (Signed)

## 2019-08-12 NOTE — Progress Notes (Addendum)
STROKE TEAM PROGRESS NOTE   INTERVAL HISTORY Patient lying in bed, not in distress.  Awake alert, conversing well.  No focal neuro deficit.  Had orthostatic hypotension on BP checking with PT/OT.  Currently BP around 90s to 100s.  Vitals:   08/11/19 2107 08/11/19 2200 08/11/19 2340 08/12/19 0415  BP: (!) 97/46 (!) 109/52 124/68 136/78  Pulse: 77 80 80 91  Resp: (!) 25 (!) 26 14 16   Temp: 98.8 F (37.1 C)     TempSrc: Oral     SpO2: 92% 91% 94% 90%  Weight:      Height:        CBC:  Recent Labs  Lab 08/09/19 1917 08/09/19 1917 08/10/19 0627 08/10/19 1117 08/11/19 0354 08/11/19 0354 08/11/19 2041 08/12/19 0412  WBC 4.9   < > 8.4   < > 4.6  --   --  6.4  NEUTROABS 3.1  --  6.5  --   --   --   --   --   HGB 4.8*   < > 6.6*   < > 7.0*   < > 8.2* 8.5*  HCT 20.4*   < > 25.1*   < > 25.0*   < > 28.0* 29.7*  MCV 73.1*   < > 77.5*   < > 77.6*  --   --  78.6*  PLT 285   < > 216   < > 155  --   --  158   < > = values in this interval not displayed.    Basic Metabolic Panel:  Recent Labs  Lab 08/11/19 0354 08/12/19 0412  NA 138 133*  K 3.4* 3.8  CL 94* 89*  CO2 34* 35*  GLUCOSE 115* 116*  BUN 16 16  CREATININE 0.67 0.88  CALCIUM 8.5* 8.5*   Lipid Panel:     Component Value Date/Time   CHOL 75 08/12/2019 0412   TRIG 62 08/12/2019 0412   HDL 23 (L) 08/12/2019 0412   CHOLHDL 3.3 08/12/2019 0412   VLDL 12 08/12/2019 0412   LDLCALC 40 08/12/2019 0412   HgbA1c:  Lab Results  Component Value Date   HGBA1C 5.7 (H) 08/12/2019   Urine Drug Screen: No results found for: LABOPIA, COCAINSCRNUR, LABBENZ, AMPHETMU, THCU, LABBARB  Alcohol Level No results found for: ETH  IMAGING past 24 hours CT ANGIO HEAD W OR WO CONTRAST  Result Date: 08/12/2019 CLINICAL DATA:  Stroke follow-up EXAM: CT ANGIOGRAPHY HEAD AND NECK TECHNIQUE: Multidetector CT imaging of the head and neck was performed using the standard protocol during bolus administration of intravenous contrast.  Multiplanar CT image reconstructions and MIPs were obtained to evaluate the vascular anatomy. Carotid stenosis measurements (when applicable) are obtained utilizing NASCET criteria, using the distal internal carotid diameter as the denominator. CONTRAST:  76mL OMNIPAQUE IOHEXOL 350 MG/ML SOLN COMPARISON:  Brain MRI from 2 days ago FINDINGS: CT HEAD FINDINGS Brain: Patient's acute infarct is too small for visualization on this study. No evidence of interval infarct or hemorrhage. No hydrocephalus or collection. Vascular: See below. Skull: Negative Sinuses: Negative Orbits: Negative Review of the MIP images confirms the above findings CTA NECK FINDINGS Aortic arch: Atherosclerotic plaque. The great vessel origins are incompletely covered. Right carotid system: Brachiocephalic atherosclerosis without stenosis. Heavily calcified ICA bulb without ulceration or flow limiting stenosis. There is subjectively moderate ECA origin stenosis. Negative for vessel beading. Left carotid system: Bulky atherosclerotic plaque at the bulb with 70% luminal stenosis. No dissection or beading seen  distally. Vertebral arteries: Proximal subclavian atherosclerosis without flow limiting stenosis. Robust flow in the bilateral vertebral arteries. Skeleton: No acute or aggressive finding. Multilevel thoracic ankylosis from bridging osteophytes or syndesmophytes. Other neck: No evidence of mass or inflammation. Upper chest: Mucus/debris in the lower trachea. Review of the MIP images confirms the above findings CTA HEAD FINDINGS Anterior circulation: Heavily calcified carotid siphons with 55% stenosis at the paraclinoid segment on the right. No branch occlusion, beading, or aneurysm. There is atherosclerotic irregularity of medium size vessels. Posterior circulation: V4 segment calcified plaque without flow limiting stenosis. The basilar is smooth and widely patent. Hypoplastic right P1 segment. No branch occlusion, beading, or aneurysm. Venous  sinuses: Diffusely patent Anatomic variants: As above Review of the MIP images confirms the above findings IMPRESSION: 1. No emergent finding. 2. Advanced cervical carotid atherosclerosis with up to 70% stenosis at the left bulb. 3. 55% stenosis at the right paraclinoid ICA. Electronically Signed   By: Monte Fantasia M.D.   On: 08/12/2019 06:24   CT ANGIO NECK W OR WO CONTRAST  Result Date: 08/12/2019 CLINICAL DATA:  Stroke follow-up EXAM: CT ANGIOGRAPHY HEAD AND NECK TECHNIQUE: Multidetector CT imaging of the head and neck was performed using the standard protocol during bolus administration of intravenous contrast. Multiplanar CT image reconstructions and MIPs were obtained to evaluate the vascular anatomy. Carotid stenosis measurements (when applicable) are obtained utilizing NASCET criteria, using the distal internal carotid diameter as the denominator. CONTRAST:  74mL OMNIPAQUE IOHEXOL 350 MG/ML SOLN COMPARISON:  Brain MRI from 2 days ago FINDINGS: CT HEAD FINDINGS Brain: Patient's acute infarct is too small for visualization on this study. No evidence of interval infarct or hemorrhage. No hydrocephalus or collection. Vascular: See below. Skull: Negative Sinuses: Negative Orbits: Negative Review of the MIP images confirms the above findings CTA NECK FINDINGS Aortic arch: Atherosclerotic plaque. The great vessel origins are incompletely covered. Right carotid system: Brachiocephalic atherosclerosis without stenosis. Heavily calcified ICA bulb without ulceration or flow limiting stenosis. There is subjectively moderate ECA origin stenosis. Negative for vessel beading. Left carotid system: Bulky atherosclerotic plaque at the bulb with 70% luminal stenosis. No dissection or beading seen distally. Vertebral arteries: Proximal subclavian atherosclerosis without flow limiting stenosis. Robust flow in the bilateral vertebral arteries. Skeleton: No acute or aggressive finding. Multilevel thoracic ankylosis from  bridging osteophytes or syndesmophytes. Other neck: No evidence of mass or inflammation. Upper chest: Mucus/debris in the lower trachea. Review of the MIP images confirms the above findings CTA HEAD FINDINGS Anterior circulation: Heavily calcified carotid siphons with 55% stenosis at the paraclinoid segment on the right. No branch occlusion, beading, or aneurysm. There is atherosclerotic irregularity of medium size vessels. Posterior circulation: V4 segment calcified plaque without flow limiting stenosis. The basilar is smooth and widely patent. Hypoplastic right P1 segment. No branch occlusion, beading, or aneurysm. Venous sinuses: Diffusely patent Anatomic variants: As above Review of the MIP images confirms the above findings IMPRESSION: 1. No emergent finding. 2. Advanced cervical carotid atherosclerosis with up to 70% stenosis at the left bulb. 3. 55% stenosis at the right paraclinoid ICA. Electronically Signed   By: Monte Fantasia M.D.   On: 08/12/2019 06:24    PHYSICAL EXAM  Temp:  [98 F (36.7 C)-98.8 F (37.1 C)] 98 F (36.7 C) (03/25 1359) Pulse Rate:  [77-91] 86 (03/25 1359) Resp:  [14-26] 20 (03/25 1359) BP: (97-136)/(46-78) 100/54 (03/25 1359) SpO2:  [90 %-95 %] 95 % (03/25 1359)  General - Well nourished, well  developed, in no apparent distress.  Ophthalmologic - fundi not visualized due to noncooperation.  Cardiovascular - Regular rhythm and rate.  Mental Status -  Level of arousal and orientation to year, place, and person were intact, but not orientated to months. Language including expression, naming, repetition, comprehension was assessed and found intact. Fund of Knowledge was assessed and was intact.  Cranial Nerves II - XII - II - Visual field intact OU. III, IV, VI - Extraocular movements intact. V - Facial sensation intact bilaterally. VII - slight left nasolabial fold flattening, not sure if chronic. VIII - Hearing & vestibular intact bilaterally. X - Palate  elevates symmetrically. XI - Chin turning & shoulder shrug intact bilaterally. XII - Tongue protrusion intact.  Motor Strength - The patient's strength was normal in all extremities and pronator drift was absent.  Bulk was normal and fasciculations were absent.   Motor Tone - Muscle tone was assessed at the neck and appendages and was normal.  Reflexes - The patient's reflexes were symmetrical in all extremities and he had no pathological reflexes.  Sensory - Light touch, temperature/pinprick were assessed and were symmetrical.    Coordination - The patient had normal movements in the hands with no ataxia or dysmetria.  Tremor was absent.  Gait and Station - deferred.   ASSESSMENT/PLAN Ronnie Snyder is a 79 y.o. male with history of PAD, colonic polyps, hypertension, hypercholesterolemia, and anemia who presented to the hospital with weakness and confusion found to have COVID-19 along with anemia.  Found to be fecal occult blood negative.  MRI brain was obtained for confusion and did show an 8 mm acute ischemic nonhemorrhagic infarct in the right frontal periventricular white matter.  Stroke:   Incidental tiny R frontal periventricular white matter infarct mostly likely secondary to large vessel atherosclerosis in the setting of severe anemia and also static hypotension  MRI  R frontal periventricular white matter infarct. Small vessel disease. Atrophy.   CTA head & neck no ELVO. L ICA bulb 70% stenosis. R paraclinoid ICA 55% stenosis   2D Echo EF 60-65%. No source of embolus. Dilated atria  LDL 40  HgbA1c 5.7  SCDs for VTE prophylaxis  prescribed pletal and plavix but not taking plavix d/t recent GIB prior to admission, now on clopidogrel 75 mg daily and pletal. Cleared by GI to be back on DAPT regimen.  Therapy recommendations:  HH PT, [place] OT, 24/7 supervision  Disposition:  Return home  COVID-19 infection  3/22 COVID positive  2nd vaccine earlier this  month  Steroids 3/23>>3/25  Remdisivir 3/23>>  Carotid stenosis  CTA h/n showed left ICA bulb 70% stenosis  Asymptomatic at this time  On DAPT  Will follow up with [facility] for monitoring  Anemia Recent GIB  S/p 4u PRBCs & IV Fe  On PPI  For OP GI eval  Dr. Henrene Pastor cleared for DAPT   Acute Hypoxic Respiratory Failure  Etiology unclear - possible pulm edema/decompensated HF w/ rapid improvement  on 1L O2  On lasix  Hx of hypertension Orthostatic hypotension  BP on the low end  Orthostatic vital - Sitting BP 94/48, HR 91 bpm. Standing: BP 67/56. Standing after marching in place 10 seconds: BP 70/40.  D/c home BP meds  Put on TED hose . BP goal normotensive . Avoid low BP  Lipid management  Home meds:  No statin  LDL 40, at goal < 70  AST 59 and ALT 133  No statin given elevated  LFTs  Other Stroke Risk Factors  Advanced age  Former Cigarette smoker  Hx ETOH use  AAA  PAD prescribed pletal and plavix PTA but not taking plavix d/t recent GIB.   Other Active Problems  Transaminitis d/t COVID vs shock liver from anemia  Acute metabolic encephalopathy, resolved  Hypothyroidism on synthroid  BPH on finasteride   Hospital day # 2  Neurology will sign off. Please call with questions. Pt will follow up with stroke clinic NP at [facility] in about 4 weeks. Thanks for the consult.  Rosalin Hawking, MD PhD Stroke Neurology 08/12/2019 5:39 PM  To contact Stroke Continuity provider, please refer to [email or web address] After hours, contact General Neurology

## 2019-08-12 NOTE — Progress Notes (Signed)
Occupational Therapy Evaluation  Clinical Impressions: PTA, pt lives at home with wife. Pt was Independent with all ADLs and mobility, as well as care-giving for wife. Presently, pt with generalized weakness and hypotension impact safe completion of daily tasks. Pt received in bed and agreeable to participate. Pt Modified Independent for bed mobility to sit EOB. Pt with consistently low BP readings (see general comments below) while sitting and standing at bedside. Min guard provided for standing and short distance mobility tasks due to low BP to ensure safety, though pt denied any dizziness. Pt able to don sock Independently sitting EOB, setup to wash face. 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 Will continue to follow acutely.     08/12/19 0900  OT Visit Information  Last OT Received On 08/12/19  Assistance Needed +1  PT/OT/SLP Co-Evaluation/Treatment Yes  Reason for Co-Treatment Complexity of the patient's impairments (multi-system involvement)  OT goals addressed during session ADL's and self-care;Other (comment) (ADL transfers/mob)  History of Present Illness 79 year old male admitted 08/09/19 with weakness and confusion. He was found to have a hemoglobin of 4.8, elevated liver enzymes and acute hypoxic respiratory failure requiring oxygen supplementation. He received 2 units of PRBC. GI consulted but no endoscopic evaluations recommended. Covid PCR positive (vaccinated-second shot earlier this month). Due to ongoing confusion, MRI brain ordered which showed punctate infarct. Neurology consulted noting the ischemic nonhemorrhagic infarct in the right frontal periventricular white matter which most likely asymptomatic and secondary to hypoperfusion/embolic event in the setting of acute anemia. PMH: PAD, colonic polyps, hypertension, hypercholesterolemia, anemia    Precautions  Precautions Fall;Other (comment)  Precaution Comments  COVID+, monitor BP  Restrictions  Weight Bearing Restrictions No  Home Living  Family/patient expects to be discharged to: Private residence  Living Arrangements Spouse/significant other  Available Help at Discharge Family  Type of [place] to enter  Entrance Stairs-Number of Steps 2  Entrance Stairs-Rails Can reach both  [place] One level  Bathroom Programmer, applications (handicapped toilet in wife's bathroom)  Home Equipment Shower seat;Cane - single point;Walker - 2 wheels  Additional Comments 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  Prior Function  Level of Independence Independent  Comments uses SPC occasionally for mobility   Communication  Communication No difficulties  Pain Assessment  Pain Assessment No/denies pain  Cognition  Arousal/Alertness Awake/alert  Behavior During Therapy WFL for tasks assessed/performed  Overall Cognitive Status Within Functional Limits for tasks assessed  Upper Extremity Assessment  Upper Extremity Assessment Generalized weakness  Lower Extremity Assessment  Lower Extremity Assessment Defer to PT evaluation  ADL  Overall ADL's  Needs assistance/impaired  Eating/Feeding Independent;Sitting  Grooming Min guard;Standing;Set up;Sitting  Grooming Details (indicate cue type and reason) Setup for washing face sitting EOB. min guard if standing to ensure safety   Upper Body Bathing Supervision/ safety;Sitting  Lower Body Bathing Min guard;Sit to/from stand  Upper Body Dressing  Supervision/safety;Sitting;Standing  Lower Body Dressing Min guard;Sit to/from stand  Lower Body Dressing Details (indicate cue type and reason) Pt able to don sock Independently sitting EOB. Min guard if in standing for LB dressing to ensure safety   Toilet Transfer Min guard;Stand-pivot  Toileting- Water quality scientist and Hygiene Min guard;Sit to/from stand  Functional mobility during ADLs Min guard  General ADL Comments Min  guard at most to ensure safety in standing due to low BPs, although pt asymptomatic  Bed Mobility  Overal bed mobility Modified Independent  Transfers  Overall transfer level Needs assistance  Equipment used None  Transfers Sit to/from Stand;Stand Pivot Transfers  Sit to Stand Min guard;Supervision  Stand pivot transfers Min guard  Balance  Overall balance assessment Needs assistance  Sitting-balance support Feet supported  Sitting balance-Leahy Scale Good  Standing balance support No upper extremity supported;During functional activity  Standing balance-Leahy Scale Good  General Comments  General comments (skin integrity, edema, etc.) At rest in bed: BP 101/54, HR 83 bpm, RR 18, O2 sat 91-93% on room air. Sitting EOB: BP 94/48, HR 91 bpm. Standing: BP 67/56. Standing after marching in place 10 seconds: BP 70/40. After ambulating around bed, taken seated: BP 91/57, HR 96 bpm, O2 sat 91%. In chair at end of session, HR 90 bpm, RR 26, O2 sat 92% on room air. Nurse informed.   OT - End of Session  Equipment Utilized During Treatment Gait belt  Activity Tolerance Patient tolerated treatment well;Other (comment) (Limited by low BPs)  Patient left in chair;with call bell/phone within reach;with chair alarm set  Nurse Communication Mobility status;Other (comment) (BPs)  OT Assessment  OT Recommendation/Assessment Patient needs continued OT Services  OT Visit Diagnosis Unsteadiness on feet (R26.81);Other abnormalities of gait and mobility (R26.89);Muscle weakness (generalized) (M62.81)  OT Problem List Decreased strength;Decreased activity tolerance;Impaired balance (sitting and/or standing)  OT Plan  OT Frequency (ACUTE ONLY) Min 3X/week  OT Treatment/Interventions (ACUTE ONLY) Self-care/ADL training;Therapeutic exercise;Energy conservation;Therapeutic activities;Patient/family education  AM-PAC OT "6 Clicks" Daily Activity Outcome Measure (Version 2)  Help from another person eating meals?  4  Help from another person taking care of personal grooming? 3  Help from another person toileting, which includes using toliet, bedpan, or urinal? 3  Help from another person bathing (including washing, rinsing, drying)? 3  Help from another person to put on and taking off regular upper body clothing? 3  Help from another person to put on and taking off regular lower body clothing? 3  6 Click Score 19  OT Recommendation  Follow Up Recommendations Home health OT;Supervision/Assistance - 24 hour  OT Equipment None recommended by OT  Individuals Consulted  Consulted and Agree with Results and Recommendations Patient  Acute Rehab OT Goals  Patient Stated Goal get some rest, get home to his wife  OT Goal Formulation With patient  Time For Goal Achievement 08/26/19  Potential to Achieve Goals Good  OT Time Calculation  OT Start Time (ACUTE ONLY) 0947  OT Stop Time (ACUTE ONLY) 1021  OT Time Calculation (min) 34 min  OT General Charges  $OT Visit 1 Visit  OT Evaluation  $OT Eval Moderate Complexity 1 Mod  Written Expression  Dominant Hand Right

## 2019-08-12 NOTE — Evaluation (Signed)
Physical Therapy Evaluation Patient Details Name: BASHIR KUSNER MRN: [id number] DOB: 1941/01/27 Today's Date: 08/12/2019   History of Present Illness  79 year old male admitted 08/09/19 with weakness and confusion. He was found to have a hemoglobin of 4.8, elevated liver enzymes and acute hypoxic respiratory failure requiring oxygen supplementation. He received 2 units of PRBC. GI consulted but no endoscopic evaluations recommended. Covid PCR positive (vaccinated-second shot earlier this month). Due to ongoing confusion, MRI brain ordered which showed punctate infarct. Neurology consulted noting the ischemic nonhemorrhagic infarct in the right frontal periventricular white matter which most likely asymptomatic and secondary to hypoperfusion/embolic event in the setting of acute anemia. PMH: PAD, colonic polyps, hypertension, hypercholesterolemia, anemia      Clinical Impression  Patient limited by low BP in standing 70/40 during evaluation but he denies dizziness or lightheadedness. He is one assist for short distance ambulation in the room. Recommend continued skilled PT services and home PT services once patient's BP is normalized. It appears patient may be caregiver for his wife. Recommend assist for IADLs and care of his wife upon discharge home.    Follow Up Recommendations Home health PT;Supervision/Assistance - 24 hour    Equipment Recommendations  None recommended by PT       Precautions / Restrictions Precautions Precautions: Fall;Other (comment) Precaution Comments: monitor BP, normotensive BP goals per neurology (SBP<140, DBP<90) Restrictions Weight Bearing Restrictions: No      Mobility  Bed Mobility Overal bed mobility: Modified Independent   Transfers Overall transfer level: Needs assistance Equipment used: None Transfers: Sit to/from Stand Sit to Stand: Min guard;Supervision         General transfer comment: close supervision/contact guard for sit<>stand  from EOB  Ambulation/Gait Ambulation/Gait assistance: Min guard Gait Distance (Feet): 10 Feet(x 2) Assistive device: None Gait Pattern/deviations: Decreased step length - right;Decreased step length - left;Step-through pattern Gait velocity: decreased   General Gait Details: Gait distance limited due to patient's low blood pressure in standing. Patient asymptomatic.     Modified Rankin (Stroke Patients Only) Modified Rankin (Stroke Patients Only) Pre-Morbid Rankin Score: No symptoms Modified Rankin: Moderately severe disability     Balance Overall balance assessment: Needs assistance Sitting-balance support: Feet supported Sitting balance-Leahy Scale: Good     Standing balance support: No upper extremity supported Standing balance-Leahy Scale: Good        Pertinent Vitals/Pain Pain Assessment: No/denies pain    Home Living Family/patient expects to be discharged to:: Private residence Living Arrangements: Spouse/significant other Available Help at Discharge: Family Type of Home: House Home Access: Stairs to enter Entrance Stairs-Rails: Can reach both Entrance Stairs-Number of Steps: 2 Home Layout: One level Home Equipment: Shower seat;Cane - single point;Walker - 2 wheels Additional Comments: Patient appears to be caregiver for wife. Per chart, wife has dementia.    Prior Function Level of Independence: Independent;Independent with assistive device(s)         Comments: sometimes used SPC for mobility        Extremity/Trunk Assessment        Lower Extremity Assessment Lower Extremity Assessment: Generalized weakness;LLE deficits/detail;RLE deficits/detail(BLE strength grossly 4+/5) RLE Sensation: (grossly intact to light touch) RLE Coordination: WNL LLE Sensation: (grossly intact to light touch) LLE Coordination: WNL       Communication   Communication: No difficulties  Cognition Arousal/Alertness: Awake/alert Behavior During Therapy: WFL for  tasks assessed/performed Overall Cognitive Status: Within Functional Limits for tasks assessed    General Comments General comments (skin integrity, edema,  etc.): At rest in bed: BP 101/54, HR 83 bpm, RR 18, O2 sat 91-93% on room air. Sitting EOB: BP 94/48, HR 91 bpm. Standing: BP 67/56. Standing after marching in place 10 seconds: BP 70/40. After ambulating around bed, taken seated: BP 91/57, HR 96 bpm, O2 sat 91%. In chair at end of session, HR 90 bpm, RR 26, O2 sat 92% on room air. Nurse informed.         Assessment/Plan    PT Assessment Patient needs continued PT services  PT Problem List Decreased strength;Decreased activity tolerance;Decreased balance;Decreased mobility;Cardiopulmonary status limiting activity       PT Treatment Interventions DME instruction;Gait training;Stair training;Functional mobility training;Therapeutic activities;Therapeutic exercise;Balance training;Patient/family education    PT Goals (Current goals can be found in the Care Plan section)  Acute Rehab PT Goals Time For Goal Achievement: 08/25/19 Potential to Achieve Goals: Good    Frequency Min 3X/week   Barriers to discharge   patient appears to be caregiver for wife    Co-evaluation PT/OT/SLP Co-Evaluation/Treatment: Yes Reason for Co-Treatment: Complexity of the patient's impairments (multi-system involvement) PT goals addressed during session: Mobility/safety with mobility;Balance         AM-PAC PT "6 Clicks" Mobility  Outcome Measure Help needed turning from your back to your side while in a flat bed without using bedrails?: None Help needed moving from lying on your back to sitting on the side of a flat bed without using bedrails?: None Help needed moving to and from a bed to a chair (including a wheelchair)?: A Little Help needed standing up from a chair using your arms (e.g., wheelchair or bedside chair)?: A Little Help needed to walk in hospital room?: A Little Help needed climbing  3-5 steps with a railing? : A Little 6 Click Score: 20    End of Session Equipment Utilized During Treatment: Gait belt Activity Tolerance: Treatment limited secondary to medical complications (Comment);Other (comment)(limited by low BP in standing) Patient left: in chair;with call bell/phone within reach;with chair alarm set Nurse Communication: Mobility status;Other (comment)(BP response) PT Visit Diagnosis: Unsteadiness on feet (R26.81);Other abnormalities of gait and mobility (R26.89)    Time: [id number] PT Time Calculation (min) (ACUTE ONLY): 27 min   Charges:   PT Evaluation $PT Eval Moderate Complexity: 1 Mod          Birdie Hopes, PT, DPT Acute Rehab [phone] office    Birdie Hopes 08/12/2019, 12:50 PM

## 2019-08-13 ENCOUNTER — Inpatient Hospital Stay (HOSPITAL_COMMUNITY): Payer: Medicare Other

## 2019-08-13 LAB — COMPREHENSIVE METABOLIC PANEL
ALT: 86 U/L — ABNORMAL HIGH (ref 0–44)
AST: 26 U/L (ref 15–41)
Albumin: 3 g/dL — ABNORMAL LOW (ref 3.5–5.0)
Alkaline Phosphatase: 60 U/L (ref 38–126)
Anion gap: 11 (ref 5–15)
BUN: 16 mg/dL (ref 8–23)
CO2: 34 mmol/L — ABNORMAL HIGH (ref 22–32)
Calcium: 8.5 mg/dL — ABNORMAL LOW (ref 8.9–10.3)
Chloride: 89 mmol/L — ABNORMAL LOW (ref 98–111)
Creatinine, Ser: 0.67 mg/dL (ref 0.61–1.24)
GFR calc Af Amer: >60 mL/min (ref >60)
GFR calc non Af Amer: >60 mL/min (ref >60)
Glucose, Bld: 99 mg/dL (ref 70–99)
Potassium: 3.3 mmol/L — ABNORMAL LOW (ref 3.5–5.1)
Sodium: 134 mmol/L — ABNORMAL LOW (ref 135–145)
Total Bilirubin: 0.8 mg/dL (ref 0.3–1.2)
Total Protein: 5 g/dL — ABNORMAL LOW (ref 6.5–8.1)

## 2019-08-13 LAB — CBC
HCT: 29.1 % — ABNORMAL LOW (ref 39.0–52.0)
Hemoglobin: 8.3 g/dL — ABNORMAL LOW (ref 13.0–17.0)
MCH: 22.4 pg — ABNORMAL LOW (ref 26.0–34.0)
MCHC: 28.5 g/dL — ABNORMAL LOW (ref 30.0–36.0)
MCV: 78.4 fL — ABNORMAL LOW (ref 80.0–100.0)
Platelets: 145 10*3/uL — ABNORMAL LOW (ref 150–400)
RBC: 3.71 MIL/uL — ABNORMAL LOW (ref 4.22–5.81)
RDW: 23.3 % — ABNORMAL HIGH (ref 11.5–15.5)
WBC: 6 10*3/uL (ref 4.0–10.5)
nRBC: 0.3 % — ABNORMAL HIGH (ref 0.0–0.2)

## 2019-08-13 LAB — C-REACTIVE PROTEIN: CRP: <0.5 mg/dL (ref <1.0)

## 2019-08-13 LAB — D-DIMER, QUANTITATIVE: D-Dimer, Quant: 1.61 ug/mL-FEU — ABNORMAL HIGH (ref 0.00–0.50)

## 2019-08-13 LAB — FERRITIN: Ferritin: 246 ng/mL (ref 24–336)

## 2019-08-13 MED ORDER — IOHEXOL 350 MG/ML SOLN
100.0000 mL | Freq: Once | INTRAVENOUS | Status: AC | PRN
  Administered 2019-08-13: 100 mL via INTRAVENOUS

## 2019-08-13 MED ORDER — POTASSIUM CHLORIDE CRYS ER 20 MEQ PO TBCR
40.0000 meq | EXTENDED_RELEASE_TABLET | Freq: Once | ORAL | Status: AC
  Administered 2019-08-13: 40 meq via ORAL
  Filled 2019-08-13: qty 2

## 2019-08-13 NOTE — Progress Notes (Signed)
   Vital Signs MEWS/VS Documentation      08/13/2019 0850 08/13/2019 1300 08/13/2019 1900 08/13/2019 2012   MEWS Score:  0  0  0  0   MEWS Score Color:  Green  Green  Green  Green   Resp:  --  18  --  20   Pulse:  --  88  --  79   BP:  --  (!) 114/58  --  (!) 111/47   Temp:  --  97.7 F (36.5 C)  --  97.8 F (36.6 C)   O2 Device:  Room [employer]  --  Room Air   Level of Consciousness:  Alert  Alert  --  Alert           Harl Bowie 08/13/2019,11:14 PM

## 2019-08-13 NOTE — TOC Initial Note (Addendum)
Transition of Care [facility]) - Initial/Assessment Note    Patient Details  Name: MANEESH LICATA MRN: [id number] Date of Birth: 06/14/1940  Transition of Care [facility]) CM/SW Contact:    Maryclare Labrador, RN Phone Number: 08/13/2019, 10:01 AM  Clinical Narrative:   PTA independent from home with his wife.  Wife will provide 24 hour supervision at home post discharge.  Pt confirms he has a PCP and denied barriers with paying for discharge meds.  Pt's son will transport home from hospital.  Pt is [facility] in [facility] as recommended.  CM provided medicare.gov HH choice - no preference given.  [facility] pt pending written orders - pt in agreement.  TOC will continue to follow                Expected Discharge Plan: [place] Barriers to Discharge: Continued Medical Work up   Patient Goals and CMS Choice Patient states their goals for this hospitalization and ongoing recovery are:: Pt did not specifiy goals CMS Medicare.gov Compare Post Acute Care list provided to:: Patient Choice offered to / list presented to : Patient  Expected Discharge Plan and Services Expected Discharge Plan: [place] Choice: [place] arrangements for the past 2 months: Single Family Home                           HH Arranged: PT, OT [place]: [place] [facility](Now called [place]) Date [place]: 08/13/19 Time [place]: 1000 Representative spoke with at [place]: [place] Arrangements/Services Living arrangements for the past 2 months: [place] Lives with:: Spouse Patient language and need for interpreter reviewed:: Yes Do you feel safe going back to the place where you live?: Yes      Need for Family Participation in Patient Care: Yes (Comment) Care giver support system in place?: Yes (comment)   Criminal Activity/Legal Involvement Pertinent to Current  Situation/Hospitalization: No - Comment as needed  Activities of Daily Living Home Assistive Devices/Equipment: None ADL Screening (condition at time of admission) Patient's cognitive ability adequate to safely complete daily activities?: Yes Is the patient deaf or have difficulty hearing?: No Does the patient have difficulty seeing, even when wearing glasses/contacts?: No Does the patient have difficulty concentrating, remembering, or making decisions?: Yes Patient able to express need for assistance with ADLs?: Yes Does the patient have difficulty dressing or bathing?: Yes Independently performs ADLs?: Yes (appropriate for developmental age) Does the patient have difficulty walking or climbing stairs?: Yes Weakness of Legs: Both Weakness of Arms/Hands: None  Permission Sought/Granted   Permission granted to share information with : Yes, Verbal Permission Granted              Emotional Assessment   Attitude/Demeanor/Rapport: Self-Confident, Engaged Affect (typically observed): Accepting, Adaptable Orientation: : Oriented to Self, Oriented to Place, Oriented to  Time, Oriented to Situation   Psych Involvement: No (comment)  Admission diagnosis:  Hypoxia [R09.02] Symptomatic anemia [D64.9] Anemia, unspecified type [D64.9] Acute respiratory failure with hypoxia ([place]) [J96.01] Patient Active Problem List   Diagnosis Date Noted  . Cerebral thrombosis with cerebral infarction 08/12/2019  . Symptomatic anemia 08/09/2019  . Acute respiratory failure with hypoxia ([place]) 08/09/2019  . Urinary hesitancy 10/26/2018  . Night sweats 10/26/2018  . Other specified hypothyroidism 05/29/2018  . Lumbar stenosis 05/23/2017  . Anemia  10/02/2016  . Ecchymosis 09/23/2013  . PAD (peripheral artery disease) ([place]) 08/09/2013  . Aftercare following surgery of the circulatory system, [place] 02/01/2013  . Smoker 08/13/2012  . Other and unspecified alcohol dependence, unspecified drinking behavior  08/13/2012  . Screening for prostate cancer 08/13/2012  . Encounter for long-term (current) use of other medications 08/13/2012  . Atherosclerosis of native artery of extremity with intermittent claudication ([place]) 08/21/2011  . ECZEMA 01/24/2010  . COUGH 01/24/2009  . AAA (abdominal aortic aneurysm) ([place]) 12/06/2008  . HYPERCHOLESTEROLEMIA 01/21/2008  . ERECTILE DYSFUNCTION, ORGANIC 01/21/2008  . Back pain 01/21/2008  . 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 Anxiety state 03/07/2007  . ERECTILE DYSFUNCTION 03/07/2007  . DEPRESSION 03/07/2007  . Essential hypertension 03/07/2007  . CAROTID ARTERY STENOSIS 03/07/2007  . HIP PAIN, RIGHT 03/07/2007  . COLONIC POLYPS, HX OF 03/07/2007  . ADENOIDECTOMY, HX OF 03/07/2007   PCP:  Luetta Nutting, DO Pharmacy:   [facility], [address] [place] [address] Phone: [phone] Fax: [place] [place] [facility] [address] AT [address] [place] [address] Phone: [phone] Fax: [facility] [address], [place] [address] Phone: [phone] Fax: [phone]     Social Determinants of Health (SDOH) Interventions    Readmission Risk Interventions No flowsheet data found.

## 2019-08-13 NOTE — Plan of Care (Signed)

## 2019-08-13 NOTE — Progress Notes (Signed)
Called the patient's spouse, [name], to give an update on the patient's status.  Called number listed for son, [name], and was told that he was staying at [facility].  No answer at [facility] number.  Left a voice message to call back at their convenience for an update on the patient's status.

## 2019-08-13 NOTE — Progress Notes (Addendum)
PROGRESS NOTE                                                                                                                                                                                                             Patient Demographics:    Ronnie Snyder, is a 79 y.o. male, DOB - February 06, 1941, [id number]  Outpatient Primary MD for the patient is Luetta Nutting, DO   Admit date - 08/09/2019   LOS - 3  No chief complaint on file.      Brief Narrative: Patient is a 79 y.o. male with PMHx of PAD, AAA hypothyroidism, HTN-who presented with weakness and confusion-was found to have a hemoglobin of 4.8, elevated liver enzymes-and acute hypoxic respiratory failure requiring oxygen supplementation.  He received 2 units of PRBC-and was subsequently admitted to the hospitalist service.  Subsequently his Covid PCR came back positive (vaccinated-second shot earlier this month).  See below for further details.  Significant Events: 3/22>> admit for severe anemia likely from recent GI bleeding, hypoxemia-Covid positive and confusion. 3/23>> MRI brain-small right frontal infarct   COVID-19 medications: Steroids: 3/23>>3/25 Remdesivir: 3/23>>  Antibiotics: Levofloxacin: 3/23>>3/24  Microbiology data: 3/22>> Covid positive  DVT prophylaxis: SCDs  Procedures: None  Consults: Gastroenterology Neurology   Subjective:  Is awake-alert-on room air this morning.  Feels "100%"   Assessment  & Plan :   Acute Hypoxic Resp Failure: Was on NRB on admission.  Improved rapidly-now on room air.  Etiology not certain-however I suspect he had pulmonary edema-and improved with diuresis.  He however did not have any obvious signs of volume overload on exam.  Lasix discontinued on 3/25 due to orthostatic hypotension.  Although he is COVID-19 positive-CRP not significantly elevated to suggest significant infection/inflammation.   Furthermore-per patient's son-patient got his second shot of vaccine earlier this month.  Procalcitonin not elevated-no white count or fever to suggest bacterial pneumonia.  Levaquin discontinued on 3/24-steroids stopped on 3/25-remains on remdesivir for now.  Last chest x-ray was suggestive of pneumonia and rt pleural effusion-I will go ahead and get a CT of the chest to delineate pathology further.  Fever: afebrile  O2 requirements:  SpO2: 98 % O2 Flow Rate (L/min): 2 L/min   COVID-19 Labs: Recent Labs    08/11/19 0354 08/12/19 0412 08/13/19 0522  DDIMER 1.44* 1.62* 1.61*  FERRITIN 22*  84 246  CRP 0.6 <0.5 <0.5       Component Value Date/Time   BNP 827.2 (H) 08/10/2019 0627    Recent Labs  Lab 08/10/19 0747  PROCALCITON <0.10    Lab Results  Component Value Date   SARSCOV2NAA POSITIVE (A) 08/09/2019     Prone/Incentive Spirometry: encouraged  incentive spirometry use 3-4/hour.  DVT Prophylaxis  :  SCDs   Transaminitis: Likely from Covid infection-or shock liver due to severe anemia.  Improving-LFTs downtrending.  Hepatitis B/hepatitis C serology negative.  Recent lower GI bleeding with acute blood loss anemia: Acknowledges melanotic stools last week-is microcytic-and iron deficient.  Hemoglobin now stable after 4 units of PRBC transfusion.  Also received 1 dose of IV iron.  GI recommending outpatient endoscopic evaluation.  Maintain on PPI.  Acute metabolic encephalopathy: Was very confused and lethargic on admission.  Suspect secondary to hypoxemia-possible Covid/bacterial pneumonia.  This also improved rapidly-the very next day post admission-he was awake and alert.  See below regarding MRI brain findings.  Acute right frontal lobe infarct: MRI brain ordered by admitting MD showed a right frontal lobe infarct.  Does not have any deficits.  CTA head and neck completed-has approximately 70% stenosis in the left ICA, echo with preserved EF without any obvious embolic  source.  LDL 48.  A1c 5.7.  After discussion with gastroenterology-we have resumed Plavix and Pletal.  Gastroenterology recommends that patient stay on PPI twice daily for now.    Orthostatic hypotension: Noted by OT on 3/25-all antihypertensives and Lasix were discontinued on 3/25-although he is orthostatic this morning he does not have any symptoms.  For now I would cautiously continue with Flomax given BPH with symptoms.  Watch another day-reassess orthostatic vital signs tomorrow.  Have placed TED stockings.  Hypokalemia: Replete and recheck  HTN: Blood pressure was well controlled-given the fact that the blood pressure was soft and patient was orthostatic-hold antihypertensives and Lasix has been discontinued.  PAD: Stable-given recent history of GI bleeding and severity of anemia-Plavix and Pletal was held-has been resumed after discussion with GI MD-see above.  History of AAA: Follow-up with Dr. Trula Slade in the outpatient setting  Hypothyroidism: Continue with Synthroid  BPH: Per nursing staff-continues to have around 300-400 cc of residual urine-but able to urinate.  Patient not keen on placing a Foley catheter.  Flomax has been increased to 0.8 mg-have added finasteride.  Continue to follow with periodic bladder scans.  Urged patient son to have patient follow with his primary urologist post discharge.   ABG:    Component Value Date/Time   TCO2 27 10/13/2012 1041    Vent Settings: N/A   Condition -  Guarded  Family Communication  :  per son -spouse has dementia-he requests that we call him at [phone] son on 3/25.  Left voicemail on 3/26  Code Status :  DNR-confirmed with spouse/son  Diet :  Diet Order            Diet Heart Room service appropriate? Yes; Fluid consistency: Thin  Diet effective now               Disposition Plan  :  Remain hospitalized-home with home health services in the next few days.  Barriers to discharge: Hypoxia work-up in  progress/Complete 5 days of IV Remdesivir  Antimicorbials  :    Anti-infectives (From admission, onward)   Start     Dose/Rate Route Frequency Ordered Stop   08/11/19 1000  remdesivir 100 mg in  sodium chloride 0.9 % 100 mL IVPB     100 mg 200 mL/hr over 30 Minutes Intravenous Daily 08/10/19 0703 08/15/19 0959   08/10/19 0800  levofloxacin (LEVAQUIN) IVPB 750 mg  Status:  Discontinued     750 mg 100 mL/hr over 90 Minutes Intravenous Every 24 hours 08/10/19 0711 08/11/19 1222   08/10/19 0715  remdesivir 200 mg in sodium chloride 0.9% 250 mL IVPB     200 mg 580 mL/hr over 30 Minutes Intravenous Once 08/10/19 0703 08/10/19 0929      Inpatient Medications  Scheduled Meds: . sodium chloride   Intravenous Once  . bisacodyl  20 mg Oral Once  . cilostazol  100 mg Oral BID AC  . clopidogrel  75 mg Oral Daily  . finasteride  5 mg Oral Daily  . lactulose  20 g Oral BID  . levothyroxine  75 mcg Oral Q0600  . pantoprazole  40 mg Oral BID  . tamsulosin  0.8 mg Oral Daily   Continuous Infusions: . sodium chloride    . remdesivir 100 mg in NS 100 mL 100 mg (08/13/19 0906)   PRN Meds:.acetaminophen **OR** acetaminophen, ondansetron **OR** ondansetron (ZOFRAN) IV   Time Spent in minutes  25   See all Orders from today for further details   Oren Binet M.D on 08/13/2019 at 1:11 PM  To page go to [email or web address] - use universal password  [facility] -  Office  [phone]    Objective:   Vitals:   08/13/19 0033 08/13/19 0328 08/13/19 0850 08/13/19 1300  BP: (!) 115/57 (!) 152/74  (!) 114/58  Pulse: 75 92  88  Resp: 18 20  18   Temp: 97.8 F (36.6 C) 97.8 F (36.6 C)  97.7 F (36.5 C)  TempSrc: Oral   Oral  SpO2: 92% 91% 95% 98%  Weight:      Height:        Wt Readings from Last 3 Encounters:  08/09/19 65.3 kg  07/12/19 65.3 kg  10/26/18 65.4 kg     Intake/Output Summary (Last 24 hours) at 08/13/2019 1311 Last data filed at 08/13/2019 1145 Gross per 24 hour    Intake 970 ml  Output 750 ml  Net 220 ml     Physical Exam Gen Exam:Alert awake-not in any distress HEENT:atraumatic, normocephalic Chest: B/L clear to auscultation anteriorly CVS:S1S2 regular Abdomen:soft non tender, non distended Extremities:no edema Neurology: Non focal Skin: no rash   Data Review:    CBC Recent Labs  Lab 08/09/19 1917 08/09/19 1917 08/10/19 0627 08/10/19 [id number] 08/10/19 1117 08/10/19 1117 08/10/19 2024 08/11/19 0354 08/11/19 2041 08/12/19 0412 08/13/19 0522  WBC 4.9   < > 8.4  --  8.8  --   --  4.6  --  6.4 6.0  HGB 4.8*   < > 6.6*   < > 6.5*   < > 7.4* 7.0* 8.2* 8.5* 8.3*  HCT 20.4*   < > 25.1*   < > 24.8*   < > 26.6* 25.0* 28.0* 29.7* 29.1*  PLT 285   < > 216  --  177  --   --  155  --  158 145*  MCV 73.1*   < > 77.5*  --  78.2*  --   --  77.6*  --  78.6* 78.4*  MCH 17.2*   < > 20.4*  --  20.5*  --   --  21.7*  --  22.5* 22.4*  MCHC 23.5*   < >  26.3*  --  26.2*  --   --  28.0*  --  28.6* 28.5*  RDW 18.4*   < > 19.9*  --  19.5*  --   --  20.2*  --  21.8* 23.3*  LYMPHSABS 0.9  --  1.1  --   --   --   --   --   --   --   --   MONOABS 0.8  --  0.3  --   --   --   --   --   --   --   --   EOSABS 0.0  --  0.2  --   --   --   --   --   --   --   --   BASOSABS 0.0  --  0.0  --   --   --   --   --   --   --   --    < > = values in this interval not displayed.    Chemistries  Recent Labs  Lab 08/09/19 1917 08/10/19 0627 08/11/19 0354 08/12/19 0412 08/13/19 0522  NA 138 140 138 133* 134*  K 4.7 4.4 3.4* 3.8 3.3*  CL 98 102 94* 89* 89*  CO2 29 30 34* 35* 34*  GLUCOSE 118* 107* 115* 116* 99  BUN 31* 26* 16 16 16   CREATININE 0.90 0.67 0.67 0.88 0.67  CALCIUM 8.6* 8.0* 8.5* 8.5* 8.5*  AST 129* 146* 59* 39 26  ALT 164* 190* 133* 111* 86*  ALKPHOS 82 78 71 67 60  BILITOT 0.7 1.3* 0.8 1.1 0.8   ------------------------------------------------------------------------------------------------------------------ Recent Labs    08/12/19 0412   CHOL 75  HDL 23*  LDLCALC 40  TRIG 62  CHOLHDL 3.3    Lab Results  Component Value Date   HGBA1C 5.7 (H) 08/12/2019   ------------------------------------------------------------------------------------------------------------------ No results for input(s): TSH, T4TOTAL, T3FREE, THYROIDAB in the last 72 hours.  Invalid input(s): FREET3 ------------------------------------------------------------------------------------------------------------------ Recent Labs    08/12/19 0412 08/13/19 0522  FERRITIN 84 246    Coagulation profile Recent Labs  Lab 08/11/19 0354  INR 1.4*    Recent Labs    08/12/19 0412 08/13/19 0522  DDIMER 1.62* 1.61*    Cardiac Enzymes No results for input(s): CKMB, TROPONINI, MYOGLOBIN in the last 168 hours.  Invalid input(s): CK ------------------------------------------------------------------------------------------------------------------    Component Value Date/Time   BNP 827.2 (H) 08/10/2019 [id number]    Micro Results Recent Results (from the past 240 hour(s))  SARS CORONAVIRUS 2 (TAT 6-24 HRS) Nasopharyngeal Nasopharyngeal Swab     Status: Abnormal   Collection Time: 08/09/19  9:39 PM   Specimen: Nasopharyngeal Swab  Result Value Ref Range Status   SARS Coronavirus 2 POSITIVE (A) NEGATIVE Final    Comment: RESULT CALLED TO, READ BACK BY AND VERIFIED WITH: RN JEQUATA WOODY AT [place] 08/10/2019 (NOTE) SARS-CoV-2 target nucleic acids are DETECTED. The SARS-CoV-2 RNA is generally detectable in upper and lower respiratory specimens during the acute phase of infection. Positive results are indicative of the presence of SARS-CoV-2 RNA. Clinical correlation with patient history and other diagnostic information is  necessary to determine patient infection status. Positive results do not rule out bacterial infection or co-infection with other viruses.  The expected result is Negative. Fact Sheet for  Patients: SugarRoll.be Fact Sheet for Healthcare Providers: https://www.woods-mathews.com/ This test is not yet approved or cleared by the [place] FDA and  has been authorized for  detection and/or diagnosis of SARS-CoV-2 by FDA under an Emergency Use Authorization (EUA). This EUA will remain  in effect (meaning this t est can be used) for the duration of the COVID-19 declaration under Section 564(b)(1) of the Act, 21 U.S.C. section 360bbb-3(b)(1), unless the authorization is terminated or revoked sooner. Performed at [facility], [place] [address] [place], [place] [address]     Radiology Reports CT ANGIO HEAD W OR WO CONTRAST  Result Date: 08/12/2019 CLINICAL DATA:  Stroke follow-up EXAM: CT ANGIOGRAPHY HEAD AND NECK TECHNIQUE: Multidetector CT imaging of the head and neck was performed using the standard protocol during bolus administration of intravenous contrast. Multiplanar CT image reconstructions and MIPs were obtained to evaluate the vascular anatomy. Carotid stenosis measurements (when applicable) are obtained utilizing NASCET criteria, using the distal internal carotid diameter as the denominator. CONTRAST:  48mL OMNIPAQUE IOHEXOL 350 MG/ML SOLN COMPARISON:  Brain MRI from 2 days ago FINDINGS: CT HEAD FINDINGS Brain: Patient's acute infarct is too small for visualization on this study. No evidence of interval infarct or hemorrhage. No hydrocephalus or collection. Vascular: See below. Skull: Negative Sinuses: Negative Orbits: Negative Review of the MIP images confirms the above findings CTA NECK FINDINGS Aortic arch: Atherosclerotic plaque. The great vessel origins are incompletely covered. Right carotid system: Brachiocephalic atherosclerosis without stenosis. Heavily calcified ICA bulb without ulceration or flow limiting stenosis. There is subjectively moderate ECA origin stenosis. Negative for vessel beading. Left carotid system: Bulky  atherosclerotic plaque at the bulb with 70% luminal stenosis. No dissection or beading seen distally. Vertebral arteries: Proximal subclavian atherosclerosis without flow limiting stenosis. Robust flow in the bilateral vertebral arteries. Skeleton: No acute or aggressive finding. Multilevel thoracic ankylosis from bridging osteophytes or syndesmophytes. Other neck: No evidence of mass or inflammation. Upper chest: Mucus/debris in the lower trachea. Review of the MIP images confirms the above findings CTA HEAD FINDINGS Anterior circulation: Heavily calcified carotid siphons with 55% stenosis at the paraclinoid segment on the right. No branch occlusion, beading, or aneurysm. There is atherosclerotic irregularity of medium size vessels. Posterior circulation: V4 segment calcified plaque without flow limiting stenosis. The basilar is smooth and widely patent. Hypoplastic right P1 segment. No branch occlusion, beading, or aneurysm. Venous sinuses: Diffusely patent Anatomic variants: As above Review of the MIP images confirms the above findings IMPRESSION: 1. No emergent finding. 2. Advanced cervical carotid atherosclerosis with up to 70% stenosis at the left bulb. 3. 55% stenosis at the right paraclinoid ICA. Electronically Signed   By: Monte Fantasia M.D.   On: 08/12/2019 06:24   CT ANGIO NECK W OR WO CONTRAST  Result Date: 08/12/2019 CLINICAL DATA:  Stroke follow-up EXAM: CT ANGIOGRAPHY HEAD AND NECK TECHNIQUE: Multidetector CT imaging of the head and neck was performed using the standard protocol during bolus administration of intravenous contrast. Multiplanar CT image reconstructions and MIPs were obtained to evaluate the vascular anatomy. Carotid stenosis measurements (when applicable) are obtained utilizing NASCET criteria, using the distal internal carotid diameter as the denominator. CONTRAST:  68mL OMNIPAQUE IOHEXOL 350 MG/ML SOLN COMPARISON:  Brain MRI from 2 days ago FINDINGS: CT HEAD FINDINGS Brain:  Patient's acute infarct is too small for visualization on this study. No evidence of interval infarct or hemorrhage. No hydrocephalus or collection. Vascular: See below. Skull: Negative Sinuses: Negative Orbits: Negative Review of the MIP images confirms the above findings CTA NECK FINDINGS Aortic arch: Atherosclerotic plaque. The great vessel origins are incompletely covered. Right carotid system: Brachiocephalic atherosclerosis without  stenosis. Heavily calcified ICA bulb without ulceration or flow limiting stenosis. There is subjectively moderate ECA origin stenosis. Negative for vessel beading. Left carotid system: Bulky atherosclerotic plaque at the bulb with 70% luminal stenosis. No dissection or beading seen distally. Vertebral arteries: Proximal subclavian atherosclerosis without flow limiting stenosis. Robust flow in the bilateral vertebral arteries. Skeleton: No acute or aggressive finding. Multilevel thoracic ankylosis from bridging osteophytes or syndesmophytes. Other neck: No evidence of mass or inflammation. Upper chest: Mucus/debris in the lower trachea. Review of the MIP images confirms the above findings CTA HEAD FINDINGS Anterior circulation: Heavily calcified carotid siphons with 55% stenosis at the paraclinoid segment on the right. No branch occlusion, beading, or aneurysm. There is atherosclerotic irregularity of medium size vessels. Posterior circulation: V4 segment calcified plaque without flow limiting stenosis. The basilar is smooth and widely patent. Hypoplastic right P1 segment. No branch occlusion, beading, or aneurysm. Venous sinuses: Diffusely patent Anatomic variants: As above Review of the MIP images confirms the above findings IMPRESSION: 1. No emergent finding. 2. Advanced cervical carotid atherosclerosis with up to 70% stenosis at the left bulb. 3. 55% stenosis at the right paraclinoid ICA. Electronically Signed   By: Monte Fantasia M.D.   On: 08/12/2019 06:24   MR BRAIN WO  CONTRAST  Result Date: 08/10/2019 CLINICAL DATA:  Initial evaluation for acute ataxia, stroke suspected. EXAM: MRI HEAD WITHOUT CONTRAST TECHNIQUE: Multiplanar, multiecho pulse sequences of the brain and surrounding structures were obtained without intravenous contrast. COMPARISON:  None available. FINDINGS: Brain: Examination degraded by motion artifact. Generalized age-related cerebral atrophy with mild chronic small vessel ischemic disease. There is a small 8 mm focus of restricted diffusion involving the right frontal periventricular white matter adjacent to the right frontal horn, consistent with a small acute ischemic small vessel type infarct (series 2, image 27). No associated hemorrhage or mass effect. No other abnormal foci of restricted diffusion to suggest acute or subacute ischemia. Gray-white matter differentiation otherwise maintained. No encephalomalacia to suggest chronic cortical infarction. No foci of susceptibility artifact to suggest acute or chronic intracranial hemorrhage. No mass lesion, midline shift or mass effect. No hydrocephalus or extra-axial fluid collection. Pituitary gland and suprasellar region within normal limits. Midline structures intact. Vascular: Major intracranial vascular flow voids are maintained. Skull and upper cervical spine: Craniocervical junction within normal limits. Bone marrow signal intensity normal. No scalp soft tissue abnormality. Sinuses/Orbits: Globes and orbital soft tissues within normal limits. Paranasal sinuses are clear. Small bilateral mastoid effusions noted, of doubtful significance. Trace layering fluid noted layering within the nasopharynx as well. Other: None. IMPRESSION: 1. 8 mm acute ischemic nonhemorrhagic small vessel type infarct involving the right frontal periventricular white matter as above. 2. No other acute intracranial abnormality. 3. Mild age-related cerebral atrophy with chronic small vessel ischemic disease. Electronically Signed    By: Jeannine Boga M.D.   On: 08/10/2019 19:05   CT ABDOMEN PELVIS W CONTRAST  Result Date: 08/10/2019 CLINICAL DATA:  Abdominal pain. EXAM: CT ABDOMEN AND PELVIS WITH CONTRAST TECHNIQUE: Multidetector CT imaging of the abdomen and pelvis was performed using the standard protocol following bolus administration of intravenous contrast. CONTRAST:  117mL OMNIPAQUE IOHEXOL 300 MG/ML  SOLN COMPARISON:  None. FINDINGS: Lower chest: Mild to moderate severity areas of atelectasis and/or infiltrate are seen within the bilateral lung bases. There are small bilateral pleural effusions. Hepatobiliary: No focal liver abnormality is seen. No gallstones, gallbladder wall thickening, or biliary dilatation. Pancreas: Unremarkable. No pancreatic ductal dilatation or surrounding inflammatory  changes. Spleen: Normal in size without focal abnormality. Adrenals/Urinary Tract: Adrenal glands are unremarkable. Kidneys are normal, without renal calculi, focal lesion, or hydronephrosis. Bladder is unremarkable. Stomach/Bowel: Stomach is within normal limits. Appendix appears normal. No evidence of bowel wall thickening, distention, or inflammatory changes. Noninflamed diverticula are seen throughout the descending and sigmoid colon. Vascular/Lymphatic: There is marked severity calcification and tortuosity of the abdominal aorta. 3.7 cm x 3.1 cm aneurysmal dilatation of the infrarenal abdominal aorta is seen. No enlarged abdominal or pelvic lymph nodes. Reproductive: Prostate is unremarkable. Other: No abdominal wall hernia or abnormality. No abdominopelvic ascites. Musculoskeletal: Multilevel degenerative changes seen throughout the lumbar spine. A metallic density compression screw device is seen within the proximal right femur. IMPRESSION: 1. Mild to moderate severity bibasilar atelectasis and/or infiltrate with small bilateral pleural effusions. 2. Noninflamed diverticula throughout the descending and sigmoid colon. 3.  Infrarenal abdominal aortic aneurysm measuring 3.7 cm x 3.1 cm. Recommend followup by ultrasound in 3 years. This recommendation follows ACR consensus guidelines: White Paper of the ACR Incidental Findings Committee II on Vascular Findings. J Am Coll Radiol 2013; 10: Aortic Atherosclerosis (ICD10-I70.0). Electronically Signed   By: Virgina Norfolk M.D.   On: 08/10/2019 21:32   DG Chest Port 1 View  Result Date: 08/10/2019 CLINICAL DATA:  Shortness of breath. EXAM: PORTABLE CHEST 1 VIEW COMPARISON:  08/09/2019 FINDINGS: The patient is rotated to the right with grossly unchanged cardiomediastinal silhouette. Aortic atherosclerosis is noted. There is increasing opacity in the right lung base extending into the right midlung likely reflecting a veiling pleural effusion and airspace consolidation. New mild opacity is present in the left lung base. No pneumothorax is identified. IMPRESSION: 1. Increasing right pleural effusion and right lung consolidation concerning for pneumonia. 2. New mild left basilar atelectasis or pneumonia. Electronically Signed   By: Logan Bores M.D.   On: 08/10/2019 09:40   DG Chest Port 1 View  Result Date: 08/09/2019 CLINICAL DATA:  Cough, progressive weakness, symptoms for 6 weeks EXAM: PORTABLE CHEST 1 VIEW COMPARISON:  12/17/2018 FINDINGS: Single frontal view of the chest demonstrates an unremarkable cardiac silhouette. There is right basilar consolidation and small right pleural effusion. No pneumothorax. Left chest is clear. No acute bony abnormalities. IMPRESSION: 1. Right basilar consolidation and small right pleural effusion. Findings could reflect bronchopneumonia. Followup PA and lateral chest X-ray is recommended in 3-4 weeks following trial of antibiotic therapy to ensure resolution. Electronically Signed   By: Randa Ngo M.D.   On: 08/09/2019 21:31   ECHOCARDIOGRAM LIMITED  Result Date: 08/10/2019    ECHOCARDIOGRAM LIMITED REPORT   Patient Name:   DEANGLEO KUBECKA Date of Exam: 08/10/2019 Medical Rec #:  [id number]            Height:       69.0 in Accession #:    [id number]           Weight:       144.0 lb Date of Birth:  1941/02/20            BSA:          1.797 m Patient Age:    20 years             BP:           132/65 mmHg Patient Gender: M                    HR:  89 bpm. Exam Location:  Inpatient Procedure: Limited Echo, Cardiac Doppler and Limited Color Doppler Indications:    Acute Respiratory Insufficiency 518.82 / R06.89  History:        Patient has no prior history of Echocardiogram examinations.                 PAD; Risk Factors:Hypertension, Dyslipidemia and GERD. COVID-19                 Positive. Cancer. Abdominal Aortic Aneurysm.  Sonographer:    Jonelle Sidle Dance Referring Phys: [place]  1. Left ventricular ejection fraction, by estimation, is 60 to 65%. The left ventricle has normal function. The left ventricle has no regional wall motion abnormalities. Left ventricular diastolic function could not be evaluated.  2. Right ventricular systolic function is normal. The right ventricular size is mildly enlarged. There is moderately elevated pulmonary artery systolic pressure. The estimated right ventricular systolic pressure is Q000111Q mmHg.  3. Left atrial size was moderately dilated.  4. Right atrial size was mildly dilated.  5. The mitral valve is degenerative. Mild mitral valve regurgitation. No evidence of mitral stenosis.  6. The aortic valve has an indeterminant number of cusps. Aortic valve regurgitation is not visualized. Mild to moderate aortic valve stenosis.  7. The inferior vena cava is dilated in size with <50% respiratory variability, suggesting right atrial pressure of 15 mmHg. FINDINGS  Left Ventricle: Left ventricular ejection fraction, by estimation, is 60 to 65%. The left ventricle has normal function. The left ventricle has no regional wall motion abnormalities. The left ventricular internal cavity size  was normal in size. There is  no left ventricular hypertrophy. Left ventricular diastolic function could not be evaluated due to mitral annular calcification (moderate or greater). Right Ventricle: The right ventricular size is mildly enlarged. No increase in right ventricular wall thickness. Right ventricular systolic function is normal. There is moderately elevated pulmonary artery systolic pressure. The tricuspid regurgitant velocity is 2.96 m/s, and with an assumed right atrial pressure of 15 mmHg, the estimated right ventricular systolic pressure is [id number] mmHg. Left Atrium: Left atrial size was moderately dilated. Right Atrium: Right atrial size was mildly dilated. Mitral Valve: The mitral valve is degenerative in appearance. Moderate mitral annular calcification. Mild mitral valve regurgitation. No evidence of mitral valve stenosis. Tricuspid Valve: The tricuspid valve is grossly normal. Tricuspid valve regurgitation is mild . No evidence of tricuspid stenosis. Aortic Valve: The aortic valve has an indeterminant number of cusps. . There is severe thickening and severe calcifcation of the aortic valve. Aortic valve regurgitation is not visualized. Mild to moderate aortic stenosis is present. There is severe thickening of the aortic valve. There is severe calcifcation of the aortic valve. Aortic valve mean gradient measures 16.0 mmHg. Aortic valve peak gradient measures 32.3 mmHg. Aortic valve area, by VTI measures 1.57 cm. Pulmonic Valve: The pulmonic valve was grossly normal. Pulmonic valve regurgitation is not visualized. No evidence of pulmonic stenosis. Aorta: The aortic root is normal in size and structure. Venous: The inferior vena cava is dilated in size with less than 50% respiratory variability, suggesting right atrial pressure of 15 mmHg. IAS/Shunts: No atrial level shunt detected by color flow Doppler.  LEFT VENTRICLE PLAX 2D LVIDd:         4.81 cm LVIDs:         4.02 cm LV PW:         1.12 cm LV  IVS:  0.95 cm LVOT diam:     2.00 cm LV SV:         86 LV SV Index:   48 LVOT Area:     3.14 cm  RIGHT VENTRICLE          IVC RV Basal diam:  2.72 cm  IVC diam: 2.58 cm LEFT ATRIUM              Index       RIGHT ATRIUM           Index LA diam:        5.00 cm  2.78 cm/m  RA Area:     21.60 cm LA Vol (A2C):   116.0 ml 64.57 ml/m RA Volume:   66.30 ml  36.90 ml/m LA Vol (A4C):   78.6 ml  43.75 ml/m LA Biplane Vol: 94.7 ml  52.71 ml/m  AORTIC VALVE AV Area (Vmax):    1.56 cm AV Area (Vmean):   1.47 cm AV Area (VTI):     1.57 cm AV Vmax:           284.00 cm/s AV Vmean:          173.000 cm/s AV VTI:            0.548 m AV Peak Grad:      32.3 mmHg AV Mean Grad:      16.0 mmHg LVOT Vmax:         141.00 cm/s LVOT Vmean:        80.700 cm/s LVOT VTI:          0.274 m LVOT/AV VTI ratio: 0.50  AORTA Ao Root diam: 3.80 cm Ao Asc diam:  3.30 cm MITRAL VALVE                TRICUSPID VALVE MV Area (PHT): 3.53 cm     TR Peak grad:   35.0 mmHg MV Decel Time: 215 msec     TR Vmax:        296.00 cm/s MV E velocity: 134.00 cm/s MV A velocity: 167.00 cm/s  SHUNTS MV E/A ratio:  0.80         Systemic VTI:  0.27 m                             Systemic Diam: 2.00 cm Eleonore Chiquito MD Electronically signed by Eleonore Chiquito MD Signature Date/Time: 08/10/2019/2:02:07 PM    Final

## 2019-08-14 DIAGNOSIS — J9601 Acute respiratory failure with hypoxia: Secondary | ICD-10-CM

## 2019-08-14 LAB — CBC
HCT: 30.4 % — ABNORMAL LOW (ref 39.0–52.0)
Hemoglobin: 8.7 g/dL — ABNORMAL LOW (ref 13.0–17.0)
MCH: 23 pg — ABNORMAL LOW (ref 26.0–34.0)
MCHC: 28.6 g/dL — ABNORMAL LOW (ref 30.0–36.0)
MCV: 80.4 fL (ref 80.0–100.0)
Platelets: 128 10*3/uL — ABNORMAL LOW (ref 150–400)
RBC: 3.78 MIL/uL — ABNORMAL LOW (ref 4.22–5.81)
RDW: 24.3 % — ABNORMAL HIGH (ref 11.5–15.5)
WBC: 6.2 10*3/uL (ref 4.0–10.5)
nRBC: 0.8 % — ABNORMAL HIGH (ref 0.0–0.2)

## 2019-08-14 LAB — COMPREHENSIVE METABOLIC PANEL
ALT: 67 U/L — ABNORMAL HIGH (ref 0–44)
AST: 23 U/L (ref 15–41)
Albumin: 3.1 g/dL — ABNORMAL LOW (ref 3.5–5.0)
Alkaline Phosphatase: 56 U/L (ref 38–126)
Anion gap: 8 (ref 5–15)
BUN: 11 mg/dL (ref 8–23)
CO2: 31 mmol/L (ref 22–32)
Calcium: 8.5 mg/dL — ABNORMAL LOW (ref 8.9–10.3)
Chloride: 94 mmol/L — ABNORMAL LOW (ref 98–111)
Creatinine, Ser: 0.68 mg/dL (ref 0.61–1.24)
GFR calc Af Amer: >60 mL/min (ref >60)
GFR calc non Af Amer: >60 mL/min (ref >60)
Glucose, Bld: 96 mg/dL (ref 70–99)
Potassium: 3.5 mmol/L (ref 3.5–5.1)
Sodium: 133 mmol/L — ABNORMAL LOW (ref 135–145)
Total Bilirubin: 0.6 mg/dL (ref 0.3–1.2)
Total Protein: 5 g/dL — ABNORMAL LOW (ref 6.5–8.1)

## 2019-08-14 LAB — AMMONIA: Ammonia: 25 umol/L (ref 9–35)

## 2019-08-14 LAB — BRAIN NATRIURETIC PEPTIDE: B Natriuretic Peptide: 158.1 pg/mL — ABNORMAL HIGH (ref 0.0–100.0)

## 2019-08-14 LAB — MAGNESIUM: Magnesium: 1.5 mg/dL — ABNORMAL LOW (ref 1.7–2.4)

## 2019-08-14 LAB — FERRITIN: Ferritin: 294 ng/mL (ref 24–336)

## 2019-08-14 LAB — C-REACTIVE PROTEIN: CRP: <0.5 mg/dL (ref <1.0)

## 2019-08-14 LAB — D-DIMER, QUANTITATIVE: D-Dimer, Quant: 1.55 ug/mL-FEU — ABNORMAL HIGH (ref 0.00–0.50)

## 2019-08-14 MED ORDER — MIDODRINE HCL 5 MG PO TABS
5.0000 mg | ORAL_TABLET | Freq: Three times a day (TID) | ORAL | Status: DC
  Administered 2019-08-14 – 2019-08-15 (×4): 5 mg via ORAL
  Filled 2019-08-14 (×4): qty 1

## 2019-08-14 MED ORDER — MAGNESIUM SULFATE 2 GM/50ML IV SOLN
2.0000 g | Freq: Once | INTRAVENOUS | Status: AC
  Administered 2019-08-14: 2 g via INTRAVENOUS
  Filled 2019-08-14: qty 50

## 2019-08-14 MED ORDER — LACTULOSE 10 GM/15ML PO SOLN: 10.0000 g | Freq: Two times a day (BID) | ORAL | Status: DC | PRN

## 2019-08-14 MED ORDER — LACTATED RINGERS IV SOLN: INTRAVENOUS | Status: DC

## 2019-08-14 MED ORDER — ACETAMINOPHEN 500 MG PO TABS
500.0000 mg | ORAL_TABLET | Freq: Four times a day (QID) | ORAL | Status: DC | PRN
  Administered 2019-08-14: 500 mg via ORAL
  Filled 2019-08-14: qty 1

## 2019-08-14 NOTE — Progress Notes (Signed)
PROGRESS NOTE                                                                                                                                                                                                             Patient Demographics:    Ronnie Snyder, is a 79 y.o. male, DOB - 09-17-1940, [id number]  Outpatient Primary MD for the patient is Luetta Nutting, DO   Admit date - 08/09/2019   LOS - 4  No chief complaint on file.      Brief Narrative: Patient is a 79 y.o. male with PMHx of PAD, AAA hypothyroidism, HTN-who presented with weakness and confusion-was found to have a hemoglobin of 4.8, elevated liver enzymes-and acute hypoxic respiratory failure requiring oxygen supplementation.  He received 2 units of PRBC-and was subsequently admitted to the hospitalist service.  Subsequently his Covid PCR came back positive (vaccinated-second shot earlier this month).  See below for further details.  Significant Events: 3/22>> admit for severe anemia likely from recent GI bleeding, hypoxemia-Covid positive and confusion. 3/23>> MRI brain-small right frontal infarct   COVID-19 medications: Steroids: 3/23>>3/25 Remdesivir: 3/23>>  Antibiotics: Levofloxacin: 3/23>>3/24  Microbiology data: 3/22>> Covid positive  DVT prophylaxis: SCDs  Procedures: None  Consults: Gastroenterology Neurology   Subjective:   Patient in bed, appears comfortable, denies any headache, no fever, no chest pain or pressure, no shortness of breath , no abdominal pain. No focal weakness.   Assessment  & Plan :   Acute Hypoxic Resp Failure: Acute due to combination of fluid overload and severe anemia, COVID-19 likely was incidental, has finished his steroid dose and is finishing his remdesivir course.  Of note his CRP was never elevated and he had been fully vaccinated prior to this hospitalization.  Fever: afebrile  O2  requirements:  SpO2: 94 % O2 Flow Rate (L/min): 2 L/min   Recent Labs  Lab 08/09/19 1917 08/09/19 1917 08/10/19 0627 08/10/19 0627 08/10/19 1117 08/10/19 2024 08/11/19 0354 08/11/19 2041 08/12/19 0412 08/13/19 0522 08/14/19 0335  WBC 4.9   < > 8.4   < > 8.8  --  4.6  --  6.4 6.0 6.2  HGB 4.8*   < > 6.6*   < > 6.5*   < > 7.0* 8.2* 8.5* 8.3* 8.7*  HCT 20.4*   < > 25.1*   < >  24.8*   < > 25.0* 28.0* 29.7* 29.1* 30.4*  PLT 285   < > 216   < > 177  --  155  --  158 145* 128*  MCV 73.1*   < > 77.5*   < > 78.2*  --  77.6*  --  78.6* 78.4* 80.4  MCH 17.2*   < > 20.4*   < > 20.5*  --  21.7*  --  22.5* 22.4* 23.0*  MCHC 23.5*   < > 26.3*   < > 26.2*  --  28.0*  --  28.6* 28.5* 28.6*  RDW 18.4*   < > 19.9*   < > 19.5*  --  20.2*  --  21.8* 23.3* 24.3*  LYMPHSABS 0.9  --  1.1  --   --   --   --   --   --   --   --   MONOABS 0.8  --  0.3  --   --   --   --   --   --   --   --   EOSABS 0.0  --  0.2  --   --   --   --   --   --   --   --   BASOSABS 0.0  --  0.0  --   --   --   --   --   --   --   --    < > = values in this interval not displayed.    Recent Labs  Lab 08/10/19 0627 08/10/19 0747 08/11/19 0354 08/12/19 0412 08/13/19 0522 08/14/19 0335 08/14/19 0846  NA 140  --  138 133* 134* 133*  --   K 4.4  --  3.4* 3.8 3.3* 3.5  --   CL 102  --  94* 89* 89* 94*  --   CO2 30  --  34* 35* 34* 31  --   GLUCOSE 107*  --  115* 116* 99 96  --   BUN 26*  --  16 16 16 11   --   CREATININE 0.67  --  0.67 0.88 0.67 0.68  --   CALCIUM 8.0*  --  8.5* 8.5* 8.5* 8.5*  --   AST 146*  --  59* 39 26 23  --   ALT 190*  --  133* 111* 86* 67*  --   ALKPHOS 78  --  71 67 60 56  --   BILITOT 1.3*  --  0.8 1.1 0.8 0.6  --   ALBUMIN 3.3*  --  3.0* 3.0* 3.0* 3.1*  --   MG  --   --   --   --   --  1.5*  --   CRP  --  0.6 0.6 <0.5 <0.5 <0.5  --   DDIMER  --  0.99* 1.44* 1.62* 1.61* 1.55*  --   PROCALCITON  --  <0.10  --   --   --   --   --   INR  --   --  1.4*  --   --   --   --   TSH 2.158  --   --    --   --   --   --   HGBA1C  --   --   --  5.7*  --   --   --   AMMONIA 57*  --   --   --   --   --  25  BNP 827.2*  --   --   --   --  158.1*  --     Recent Labs  Lab 08/09/19 2139 08/10/19 0627 08/10/19 0747 08/11/19 0354 08/12/19 0412 08/13/19 0522 08/14/19 0335  CRP  --   --  0.6 0.6 <0.5 <0.5 <0.5  DDIMER  --   --  0.99* 1.44* 1.62* 1.61* 1.55*  BNP  --  827.2*  --   --   --   --  158.1*  PROCALCITON  --   --  <0.10  --   --   --   --   SARSCOV2NAA POSITIVE*  --   --   --   --   --   --         DVT Prophylaxis  :  SCDs   Transaminitis: Likely from Covid infection-or shock liver due to severe anemia.  Improving-LFTs downtrending.  Hepatitis B/hepatitis C serology negative.  Recent lower GI bleeding with acute blood loss anemia: Acknowledges melanotic stools last week-is microcytic-and iron deficient.  Hemoglobin now stable after 4 units of PRBC transfusion.  Also received 1 dose of IV iron.  GI recommending outpatient endoscopic evaluation.  Maintain on PPI.  Acute metabolic encephalopathy: Was very confused and lethargic on admission.  Suspect secondary to hypoxemia-possible Covid/bacterial pneumonia.  This also improved rapidly-the very next day post admission-he was awake and alert.  See below regarding MRI brain findings.  Acute right frontal lobe infarct: MRI brain ordered by admitting MD showed a right frontal lobe infarct.  Does not have any deficits.  CTA head and neck completed-has approximately 70% stenosis in the left ICA, echo with preserved EF without any obvious embolic source.  LDL 48.  A1c 5.7.  After discussion with gastroenterology-we have resumed Plavix and Pletal.  Gastroenterology recommends that patient stay on PPI twice daily for now.    Orthostatic hypotension: Hydrated on 08/14/2019, TED stockings continued, midodrine added, will monitor.  Hypokalemia: Replete and recheck  HTN: Blood pressure was well controlled-given the fact that the blood pressure  was soft and patient was orthostatic-hold antihypertensives and Lasix has been discontinued.  PAD: Stable-given recent history of GI bleeding and severity of anemia-Plavix and Pletal was held-has been resumed after discussion with GI MD-see above.  History of AAA: Follow-up with Dr. Trula Slade in the outpatient setting  Hypothyroidism: Continue with Synthroid  BPH: Per nursing staff-continues to have around 300-400 cc of residual urine-but able to urinate.  Patient not keen on placing a Foley catheter.  Flomax has been increased to 0.8 mg-have added finasteride.  Continue to follow with periodic bladder scans.  Urged patient son to have patient follow with his primary urologist post discharge.   Hypomagnesemia.  Replaced.    Condition -  Guarded  Family Communication  :  Son at [place] - [phone] -  08/14/19 and wife - patient's son and wife were constantly arguing and fighting each other during the phone call.  Code Status :  DNR - confirmed with spouse/son  Diet :  Diet Order            Diet Heart Room service appropriate? Yes; Fluid consistency: Thin  Diet effective now               Disposition Plan  :  Remain hospitalized-home with home health services if H&H stays stable and he finishes his remdesivir course.  Family unsure whether he is to come home or go to SNF.  Case  management talking to family on deciding his eventual placement.  Barriers to discharge: Hypoxia with continued oxygen requirement, monitoring H&H for GI bleed.  Antimicorbials  :    Anti-infectives (From admission, onward)   Start     Dose/Rate Route Frequency Ordered Stop   08/11/19 1000  remdesivir 100 mg in sodium chloride 0.9 % 100 mL IVPB     100 mg 200 mL/hr over 30 Minutes Intravenous Daily 08/10/19 0703 08/14/19 0938   08/10/19 0800  levofloxacin (LEVAQUIN) IVPB 750 mg  Status:  Discontinued     750 mg 100 mL/hr over 90 Minutes Intravenous Every 24 hours 08/10/19 0711 08/11/19 1222   08/10/19  0715  remdesivir 200 mg in sodium chloride 0.9% 250 mL IVPB     200 mg 580 mL/hr over 30 Minutes Intravenous Once 08/10/19 0703 08/10/19 0929      Inpatient Medications  Scheduled Meds: . bisacodyl  20 mg Oral Once  . cilostazol  100 mg Oral BID AC  . clopidogrel  75 mg Oral Daily  . finasteride  5 mg Oral Daily  . levothyroxine  75 mcg Oral Q0600  . midodrine  5 mg Oral TID WC  . pantoprazole  40 mg Oral BID  . tamsulosin  0.8 mg Oral Daily   Continuous Infusions:  PRN Meds:.acetaminophen, lactulose, [DISCONTINUED] ondansetron **OR** ondansetron (ZOFRAN) IV   Time Spent in minutes  25   See all Orders from today for further details   Lala Lund M.D on 08/14/2019 at 1:00 PM  To page go to [email or web address] - use universal password  [facility] -  Office  [phone]    Objective:   Vitals:   08/13/19 2012 08/14/19 0401 08/14/19 0733 08/14/19 0854  BP: (!) 111/47 (!) 143/62 (!) 155/78   Pulse: 79 72 78   Resp: 20 17 (!) 21 (!) 22  Temp: 97.8 F (36.6 C) 98.3 F (36.8 C) 98 F (36.7 C)   TempSrc: Oral Oral Oral   SpO2: 99% 92% 95% 94%  Weight:      Height:        Wt Readings from Last 3 Encounters:  08/09/19 65.3 kg  07/12/19 65.3 kg  10/26/18 65.4 kg     Intake/Output Summary (Last 24 hours) at 08/14/2019 1300 Last data filed at 08/14/2019 0830 Gross per 24 hour  Intake 240 ml  Output 1110 ml  Net -870 ml     Physical Exam  Awake Alert, No new F.N deficits, Normal affect [place].AT,PERRAL Supple Neck,No JVD, No cervical lymphadenopathy appriciated.  Symmetrical Chest wall movement, Good air movement bilaterally, CTAB RRR,No Gallops, Rubs or new Murmurs, No Parasternal Heave +ve B.Sounds, Abd Soft, No tenderness, No organomegaly appriciated, No rebound - guarding or rigidity. No Cyanosis, Clubbing or edema, No new Rash or bruise    Data Review:    CBC Recent Labs  Lab 08/09/19 1917 08/09/19 1917 08/10/19 [id number] 08/10/19 [id number]  08/10/19 1117 08/10/19 2024 08/11/19 0354 08/11/19 2041 08/12/19 0412 08/13/19 0522 08/14/19 0335  WBC 4.9   < > 8.4   < > 8.8  --  4.6  --  6.4 6.0 6.2  HGB 4.8*   < > 6.6*   < > 6.5*   < > 7.0* 8.2* 8.5* 8.3* 8.7*  HCT 20.4*   < > 25.1*   < > 24.8*   < > 25.0* 28.0* 29.7* 29.1* 30.4*  PLT 285   < > 216   < > 177  --  155  --  158 145* 128*  MCV 73.1*   < > 77.5*   < > 78.2*  --  77.6*  --  78.6* 78.4* 80.4  MCH 17.2*   < > 20.4*   < > 20.5*  --  21.7*  --  22.5* 22.4* 23.0*  MCHC 23.5*   < > 26.3*   < > 26.2*  --  28.0*  --  28.6* 28.5* 28.6*  RDW 18.4*   < > 19.9*   < > 19.5*  --  20.2*  --  21.8* 23.3* 24.3*  LYMPHSABS 0.9  --  1.1  --   --   --   --   --   --   --   --   MONOABS 0.8  --  0.3  --   --   --   --   --   --   --   --   EOSABS 0.0  --  0.2  --   --   --   --   --   --   --   --   BASOSABS 0.0  --  0.0  --   --   --   --   --   --   --   --    < > = values in this interval not displayed.    Chemistries  Recent Labs  Lab 08/10/19 0627 08/11/19 0354 08/12/19 0412 08/13/19 0522 08/14/19 0335  NA 140 138 133* 134* 133*  K 4.4 3.4* 3.8 3.3* 3.5  CL 102 94* 89* 89* 94*  CO2 30 34* 35* 34* 31  GLUCOSE 107* 115* 116* 99 96  BUN 26* 16 16 16 11   CREATININE 0.67 0.67 0.88 0.67 0.68  CALCIUM 8.0* 8.5* 8.5* 8.5* 8.5*  MG  --   --   --   --  1.5*  AST 146* 59* 39 26 23  ALT 190* 133* 111* 86* 67*  ALKPHOS 78 71 67 60 56  BILITOT 1.3* 0.8 1.1 0.8 0.6   ------------------------------------------------------------------------------------------------------------------ Recent Labs    08/12/19 0412  CHOL 75  HDL 23*  LDLCALC 40  TRIG 62  CHOLHDL 3.3    Lab Results  Component Value Date   HGBA1C 5.7 (H) 08/12/2019   ------------------------------------------------------------------------------------------------------------------ No results for input(s): TSH, T4TOTAL, T3FREE, THYROIDAB in the last 72 hours.  Invalid input(s):  FREET3 ------------------------------------------------------------------------------------------------------------------ Recent Labs    08/13/19 0522 08/14/19 0335  FERRITIN 246 294    Coagulation profile Recent Labs  Lab 08/11/19 0354  INR 1.4*    Recent Labs    08/13/19 0522 08/14/19 0335  DDIMER 1.61* 1.55*    Cardiac Enzymes No results for input(s): CKMB, TROPONINI, MYOGLOBIN in the last 168 hours.  Invalid input(s): CK ------------------------------------------------------------------------------------------------------------------    Component Value Date/Time   BNP 158.1 (H) 08/14/2019 0335    Micro Results Recent Results (from the past 240 hour(s))  SARS CORONAVIRUS 2 (TAT 6-24 HRS) Nasopharyngeal Nasopharyngeal Swab     Status: Abnormal   Collection Time: 08/09/19  9:39 PM   Specimen: Nasopharyngeal Swab  Result Value Ref Range Status   SARS Coronavirus 2 POSITIVE (A) NEGATIVE Final    Comment: RESULT CALLED TO, READ BACK BY AND VERIFIED WITH: RN JEQUATA WOODY AT [id number] BY [employer]culator 08/10/2019 (NOTE) SARS-CoV-2 target nucleic acids are DETECTED. The SARS-CoV-2 RNA is generally detectable in upper and lower respiratory specimens during the acute phase of infection. Positive results are indicative  of the presence of SARS-CoV-2 RNA. Clinical correlation with patient history and other diagnostic information is  necessary to determine patient infection status. Positive results do not rule out bacterial infection or co-infection with other viruses.  The expected result is Negative. Fact Sheet for Patients: SugarRoll.be Fact Sheet for Healthcare Providers: https://www.woods-mathews.com/ This test is not yet approved or cleared by the [place] FDA and  has been authorized for detection and/or diagnosis of SARS-CoV-2 by FDA under an Emergency Use Authorization (EUA). This EUA will remain  in effect  (meaning this t est can be used) for the duration of the COVID-19 declaration under Section 564(b)(1) of the Act, 21 U.S.C. section 360bbb-3(b)(1), unless the authorization is terminated or revoked sooner. Performed at [facility], [place] [address], [place], [place] [address]     Radiology Reports CT ANGIO HEAD W OR WO CONTRAST  Result Date: 08/12/2019 CLINICAL DATA:  Stroke follow-up EXAM: CT ANGIOGRAPHY HEAD AND NECK TECHNIQUE: Multidetector CT imaging of the head and neck was performed using the standard protocol during bolus administration of intravenous contrast. Multiplanar CT image reconstructions and MIPs were obtained to evaluate the vascular anatomy. Carotid stenosis measurements (when applicable) are obtained utilizing NASCET criteria, using the distal internal carotid diameter as the denominator. CONTRAST:  25mL OMNIPAQUE IOHEXOL 350 MG/ML SOLN COMPARISON:  Brain MRI from 2 days ago FINDINGS: CT HEAD FINDINGS Brain: Patient's acute infarct is too small for visualization on this study. No evidence of interval infarct or hemorrhage. No hydrocephalus or collection. Vascular: See below. Skull: Negative Sinuses: Negative Orbits: Negative Review of the MIP images confirms the above findings CTA NECK FINDINGS Aortic arch: Atherosclerotic plaque. The great vessel origins are incompletely covered. Right carotid system: Brachiocephalic atherosclerosis without stenosis. Heavily calcified ICA bulb without ulceration or flow limiting stenosis. There is subjectively moderate ECA origin stenosis. Negative for vessel beading. Left carotid system: Bulky atherosclerotic plaque at the bulb with 70% luminal stenosis. No dissection or beading seen distally. Vertebral arteries: Proximal subclavian atherosclerosis without flow limiting stenosis. Robust flow in the bilateral vertebral arteries. Skeleton: No acute or aggressive finding. Multilevel thoracic ankylosis from bridging osteophytes or syndesmophytes.  Other neck: No evidence of mass or inflammation. Upper chest: Mucus/debris in the lower trachea. Review of the MIP images confirms the above findings CTA HEAD FINDINGS Anterior circulation: Heavily calcified carotid siphons with 55% stenosis at the paraclinoid segment on the right. No branch occlusion, beading, or aneurysm. There is atherosclerotic irregularity of medium size vessels. Posterior circulation: V4 segment calcified plaque without flow limiting stenosis. The basilar is smooth and widely patent. Hypoplastic right P1 segment. No branch occlusion, beading, or aneurysm. Venous sinuses: Diffusely patent Anatomic variants: As above Review of the MIP images confirms the above findings IMPRESSION: 1. No emergent finding. 2. Advanced cervical carotid atherosclerosis with up to 70% stenosis at the left bulb. 3. 55% stenosis at the right paraclinoid ICA. Electronically Signed   By: Monte Fantasia M.D.   On: 08/12/2019 06:24   CT ANGIO NECK W OR WO CONTRAST  Result Date: 08/12/2019 CLINICAL DATA:  Stroke follow-up EXAM: CT ANGIOGRAPHY HEAD AND NECK TECHNIQUE: Multidetector CT imaging of the head and neck was performed using the standard protocol during bolus administration of intravenous contrast. Multiplanar CT image reconstructions and MIPs were obtained to evaluate the vascular anatomy. Carotid stenosis measurements (when applicable) are obtained utilizing NASCET criteria, using the distal internal carotid diameter as the denominator. CONTRAST:  76mL OMNIPAQUE IOHEXOL 350 MG/ML SOLN COMPARISON:  Brain MRI from 2 days ago FINDINGS: CT HEAD FINDINGS Brain: Patient's acute infarct is too small for visualization on this study. No evidence of interval infarct or hemorrhage. No hydrocephalus or collection. Vascular: See below. Skull: Negative Sinuses: Negative Orbits: Negative Review of the MIP images confirms the above findings CTA NECK FINDINGS Aortic arch: Atherosclerotic plaque. The great vessel origins are  incompletely covered. Right carotid system: Brachiocephalic atherosclerosis without stenosis. Heavily calcified ICA bulb without ulceration or flow limiting stenosis. There is subjectively moderate ECA origin stenosis. Negative for vessel beading. Left carotid system: Bulky atherosclerotic plaque at the bulb with 70% luminal stenosis. No dissection or beading seen distally. Vertebral arteries: Proximal subclavian atherosclerosis without flow limiting stenosis. Robust flow in the bilateral vertebral arteries. Skeleton: No acute or aggressive finding. Multilevel thoracic ankylosis from bridging osteophytes or syndesmophytes. Other neck: No evidence of mass or inflammation. Upper chest: Mucus/debris in the lower trachea. Review of the MIP images confirms the above findings CTA HEAD FINDINGS Anterior circulation: Heavily calcified carotid siphons with 55% stenosis at the paraclinoid segment on the right. No branch occlusion, beading, or aneurysm. There is atherosclerotic irregularity of medium size vessels. Posterior circulation: V4 segment calcified plaque without flow limiting stenosis. The basilar is smooth and widely patent. Hypoplastic right P1 segment. No branch occlusion, beading, or aneurysm. Venous sinuses: Diffusely patent Anatomic variants: As above Review of the MIP images confirms the above findings IMPRESSION: 1. No emergent finding. 2. Advanced cervical carotid atherosclerosis with up to 70% stenosis at the left bulb. 3. 55% stenosis at the right paraclinoid ICA. Electronically Signed   By: Monte Fantasia M.D.   On: 08/12/2019 06:24   CT ANGIO CHEST PE W OR WO CONTRAST  Result Date: 08/13/2019 CLINICAL DATA:  Shortness of breath.  COVID-19 positive EXAM: CT ANGIOGRAPHY CHEST WITH CONTRAST TECHNIQUE: Multidetector CT imaging of the chest was performed using the standard protocol during bolus administration of intravenous contrast. Multiplanar CT image reconstructions and MIPs were obtained to evaluate  the vascular anatomy. CONTRAST:  162mL OMNIPAQUE IOHEXOL 350 MG/ML SOLN COMPARISON:  Chest radiograph August 10, 2019 FINDINGS: Cardiovascular: There is no demonstrable pulmonary embolus. There is no thoracic aortic aneurysm or dissection. There are foci calcification throughout visualized proximal great vessels as well as foci of aortic atherosclerosis. Multiple foci of coronary artery calcification noted. There is left ventricular hypertrophy. There is no pericardial effusion or pericardial thickening. Mediastinum/Nodes: Thyroid appears unremarkable. There is no appreciable thoracic adenopathy. There are no evident esophageal lesions. Lungs/Pleura: There are moderate free-flowing pleural effusions bilaterally. There is a degree of underlying centrilobular emphysematous change. There is consolidation in the right lower lobe and to a lesser extent in the left lower lobe posteriorly. Some of this consolidation is likely due to compressive atelectasis. On axial slice 55 series 7, there is a 5 mm nodular opacity in the medial segment of the right lower lobe. Upper Abdomen: In the visualized upper abdomen, there is aortic and major mesenteric arterial vascular calcification. There is a degree of ectasia in the visualized upper abdominal aorta. Visualized upper abdominal structures otherwise appear unremarkable. Musculoskeletal: There is degenerative change in the thoracic spine with increased kyphosis. No blastic or lytic bone lesions are evident. There are no evident chest wall lesions. Review of the MIP images confirms the above findings. IMPRESSION: 1. No demonstrable pulmonary embolus. No thoracic aortic aneurysm or dissection. There is extensive aortic atherosclerosis as well as foci of great vessel and coronary artery calcification. There is left  ventricular hypertrophy. 2. Moderate free-flowing pleural effusions bilaterally with airspace opacity in the lung bases which in part is due to compressive atelectasis.  Suspect probable superimposed pneumonia in the bases, more notable on the right than the left. There is a degree of underlying centrilobular emphysematous change. 3. 5 mm nodular opacity in the medial segment right lower lobe. No follow-up needed if patient is low-risk. Non-contrast chest CT can be considered in 12 months if patient is high-risk. This recommendation follows the consensus statement: Guidelines for Management of Incidental Pulmonary Nodules Detected on CT Images: From the Fleischner Society 2017; Radiology 2017; 284:228-243. 4.  No evident adenopathy. Aortic Atherosclerosis (ICD10-I70.0) and Emphysema (ICD10-J43.9). Electronically Signed   By: Lowella Grip III M.D.   On: 08/13/2019 13:38   MR BRAIN WO CONTRAST  Result Date: 08/10/2019 CLINICAL DATA:  Initial evaluation for acute ataxia, stroke suspected. EXAM: MRI HEAD WITHOUT CONTRAST TECHNIQUE: Multiplanar, multiecho pulse sequences of the brain and surrounding structures were obtained without intravenous contrast. COMPARISON:  None available. FINDINGS: Brain: Examination degraded by motion artifact. Generalized age-related cerebral atrophy with mild chronic small vessel ischemic disease. There is a small 8 mm focus of restricted diffusion involving the right frontal periventricular white matter adjacent to the right frontal horn, consistent with a small acute ischemic small vessel type infarct (series 2, image 27). No associated hemorrhage or mass effect. No other abnormal foci of restricted diffusion to suggest acute or subacute ischemia. Gray-white matter differentiation otherwise maintained. No encephalomalacia to suggest chronic cortical infarction. No foci of susceptibility artifact to suggest acute or chronic intracranial hemorrhage. No mass lesion, midline shift or mass effect. No hydrocephalus or extra-axial fluid collection. Pituitary gland and suprasellar region within normal limits. Midline structures intact. Vascular: Major  intracranial vascular flow voids are maintained. Skull and upper cervical spine: Craniocervical junction within normal limits. Bone marrow signal intensity normal. No scalp soft tissue abnormality. Sinuses/Orbits: Globes and orbital soft tissues within normal limits. Paranasal sinuses are clear. Small bilateral mastoid effusions noted, of doubtful significance. Trace layering fluid noted layering within the nasopharynx as well. Other: None. IMPRESSION: 1. 8 mm acute ischemic nonhemorrhagic small vessel type infarct involving the right frontal periventricular white matter as above. 2. No other acute intracranial abnormality. 3. Mild age-related cerebral atrophy with chronic small vessel ischemic disease. Electronically Signed   By: Jeannine Boga M.D.   On: 08/10/2019 19:05   CT ABDOMEN PELVIS W CONTRAST  Result Date: 08/10/2019 CLINICAL DATA:  Abdominal pain. EXAM: CT ABDOMEN AND PELVIS WITH CONTRAST TECHNIQUE: Multidetector CT imaging of the abdomen and pelvis was performed using the standard protocol following bolus administration of intravenous contrast. CONTRAST:  119mL OMNIPAQUE IOHEXOL 300 MG/ML  SOLN COMPARISON:  None. FINDINGS: Lower chest: Mild to moderate severity areas of atelectasis and/or infiltrate are seen within the bilateral lung bases. There are small bilateral pleural effusions. Hepatobiliary: No focal liver abnormality is seen. No gallstones, gallbladder wall thickening, or biliary dilatation. Pancreas: Unremarkable. No pancreatic ductal dilatation or surrounding inflammatory changes. Spleen: Normal in size without focal abnormality. Adrenals/Urinary Tract: Adrenal glands are unremarkable. Kidneys are normal, without renal calculi, focal lesion, or hydronephrosis. Bladder is unremarkable. Stomach/Bowel: Stomach is within normal limits. Appendix appears normal. No evidence of bowel wall thickening, distention, or inflammatory changes. Noninflamed diverticula are seen throughout the  descending and sigmoid colon. Vascular/Lymphatic: There is marked severity calcification and tortuosity of the abdominal aorta. 3.7 cm x 3.1 cm aneurysmal dilatation of the infrarenal abdominal aorta is seen.  No enlarged abdominal or pelvic lymph nodes. Reproductive: Prostate is unremarkable. Other: No abdominal wall hernia or abnormality. No abdominopelvic ascites. Musculoskeletal: Multilevel degenerative changes seen throughout the lumbar spine. A metallic density compression screw device is seen within the proximal right femur. IMPRESSION: 1. Mild to moderate severity bibasilar atelectasis and/or infiltrate with small bilateral pleural effusions. 2. Noninflamed diverticula throughout the descending and sigmoid colon. 3. Infrarenal abdominal aortic aneurysm measuring 3.7 cm x 3.1 cm. Recommend followup by ultrasound in 3 years. This recommendation follows ACR consensus guidelines: White Paper of the ACR Incidental Findings Committee II on Vascular Findings. J Am Coll Radiol 2013; 10: Aortic Atherosclerosis (ICD10-I70.0). Electronically Signed   By: Virgina Norfolk M.D.   On: 08/10/2019 21:32   DG Chest Port 1 View  Result Date: 08/10/2019 CLINICAL DATA:  Shortness of breath. EXAM: PORTABLE CHEST 1 VIEW COMPARISON:  08/09/2019 FINDINGS: The patient is rotated to the right with grossly unchanged cardiomediastinal silhouette. Aortic atherosclerosis is noted. There is increasing opacity in the right lung base extending into the right midlung likely reflecting a veiling pleural effusion and airspace consolidation. New mild opacity is present in the left lung base. No pneumothorax is identified. IMPRESSION: 1. Increasing right pleural effusion and right lung consolidation concerning for pneumonia. 2. New mild left basilar atelectasis or pneumonia. Electronically Signed   By: Logan Bores M.D.   On: 08/10/2019 09:40   DG Chest Port 1 View  Result Date: 08/09/2019 CLINICAL DATA:  Cough, progressive weakness,  symptoms for 6 weeks EXAM: PORTABLE CHEST 1 VIEW COMPARISON:  12/17/2018 FINDINGS: Single frontal view of the chest demonstrates an unremarkable cardiac silhouette. There is right basilar consolidation and small right pleural effusion. No pneumothorax. Left chest is clear. No acute bony abnormalities. IMPRESSION: 1. Right basilar consolidation and small right pleural effusion. Findings could reflect bronchopneumonia. Followup PA and lateral chest X-ray is recommended in 3-4 weeks following trial of antibiotic therapy to ensure resolution. Electronically Signed   By: Randa Ngo M.D.   On: 08/09/2019 21:31   ECHOCARDIOGRAM LIMITED  Result Date: 08/10/2019    ECHOCARDIOGRAM LIMITED REPORT   Patient Name:   FLAVIO MILLENDER Date of Exam: 08/10/2019 Medical Rec #:  [id number]            Height:       69.0 in Accession #:    [id number]           Weight:       144.0 lb Date of Birth:  1940/10/12            BSA:          1.797 m Patient Age:    10 years             BP:           132/65 mmHg Patient Gender: M                    HR:           89 bpm. Exam Location:  Inpatient Procedure: Limited Echo, Cardiac Doppler and Limited Color Doppler Indications:    Acute Respiratory Insufficiency 518.82 / R06.89  History:        Patient has no prior history of Echocardiogram examinations.                 PAD; Risk Factors:Hypertension, Dyslipidemia and GERD. COVID-19  Positive. Cancer. Abdominal Aortic Aneurysm.  Sonographer:    Jonelle Sidle Dance Referring Phys: [place]  1. Left ventricular ejection fraction, by estimation, is 60 to 65%. The left ventricle has normal function. The left ventricle has no regional wall motion abnormalities. Left ventricular diastolic function could not be evaluated.  2. Right ventricular systolic function is normal. The right ventricular size is mildly enlarged. There is moderately elevated pulmonary artery systolic pressure. The estimated right  ventricular systolic pressure is Q000111Q mmHg.  3. Left atrial size was moderately dilated.  4. Right atrial size was mildly dilated.  5. The mitral valve is degenerative. Mild mitral valve regurgitation. No evidence of mitral stenosis.  6. The aortic valve has an indeterminant number of cusps. Aortic valve regurgitation is not visualized. Mild to moderate aortic valve stenosis.  7. The inferior vena cava is dilated in size with <50% respiratory variability, suggesting right atrial pressure of 15 mmHg. FINDINGS  Left Ventricle: Left ventricular ejection fraction, by estimation, is 60 to 65%. The left ventricle has normal function. The left ventricle has no regional wall motion abnormalities. The left ventricular internal cavity size was normal in size. There is  no left ventricular hypertrophy. Left ventricular diastolic function could not be evaluated due to mitral annular calcification (moderate or greater). Right Ventricle: The right ventricular size is mildly enlarged. No increase in right ventricular wall thickness. Right ventricular systolic function is normal. There is moderately elevated pulmonary artery systolic pressure. The tricuspid regurgitant velocity is 2.96 m/s, and with an assumed right atrial pressure of 15 mmHg, the estimated right ventricular systolic pressure is Q000111Q mmHg. Left Atrium: Left atrial size was moderately dilated. Right Atrium: Right atrial size was mildly dilated. Mitral Valve: The mitral valve is degenerative in appearance. Moderate mitral annular calcification. Mild mitral valve regurgitation. No evidence of mitral valve stenosis. Tricuspid Valve: The tricuspid valve is grossly normal. Tricuspid valve regurgitation is mild . No evidence of tricuspid stenosis. Aortic Valve: The aortic valve has an indeterminant number of cusps. . There is severe thickening and severe calcifcation of the aortic valve. Aortic valve regurgitation is not visualized. Mild to moderate aortic stenosis is  present. There is severe thickening of the aortic valve. There is severe calcifcation of the aortic valve. Aortic valve mean gradient measures 16.0 mmHg. Aortic valve peak gradient measures 32.3 mmHg. Aortic valve area, by VTI measures 1.57 cm. Pulmonic Valve: The pulmonic valve was grossly normal. Pulmonic valve regurgitation is not visualized. No evidence of pulmonic stenosis. Aorta: The aortic root is normal in size and structure. Venous: The inferior vena cava is dilated in size with less than 50% respiratory variability, suggesting right atrial pressure of 15 mmHg. IAS/Shunts: No atrial level shunt detected by color flow Doppler.  LEFT VENTRICLE PLAX 2D LVIDd:         4.81 cm LVIDs:         4.02 cm LV PW:         1.12 cm LV IVS:        0.95 cm LVOT diam:     2.00 cm LV SV:         86 LV SV Index:   48 LVOT Area:     3.14 cm  RIGHT VENTRICLE          IVC RV Basal diam:  2.72 cm  IVC diam: 2.58 cm LEFT ATRIUM              Index  RIGHT ATRIUM           Index LA diam:        5.00 cm  2.78 cm/m  RA Area:     21.60 cm LA Vol (A2C):   116.0 ml 64.57 ml/m RA Volume:   66.30 ml  36.90 ml/m LA Vol (A4C):   78.6 ml  43.75 ml/m LA Biplane Vol: 94.7 ml  52.71 ml/m  AORTIC VALVE AV Area (Vmax):    1.56 cm AV Area (Vmean):   1.47 cm AV Area (VTI):     1.57 cm AV Vmax:           284.00 cm/s AV Vmean:          173.000 cm/s AV VTI:            0.548 m AV Peak Grad:      32.3 mmHg AV Mean Grad:      16.0 mmHg LVOT Vmax:         141.00 cm/s LVOT Vmean:        80.700 cm/s LVOT VTI:          0.274 m LVOT/AV VTI ratio: 0.50  AORTA Ao Root diam: 3.80 cm Ao Asc diam:  3.30 cm MITRAL VALVE                TRICUSPID VALVE MV Area (PHT): 3.53 cm     TR Peak grad:   35.0 mmHg MV Decel Time: 215 msec     TR Vmax:        296.00 cm/s MV E velocity: 134.00 cm/s MV A velocity: 167.00 cm/s  SHUNTS MV E/A ratio:  0.80         Systemic VTI:  0.27 m                             Systemic Diam: 2.00 cm Eleonore Chiquito MD Electronically  signed by Eleonore Chiquito MD Signature Date/Time: 08/10/2019/2:02:07 PM    Final

## 2019-08-14 NOTE — Progress Notes (Signed)
Conducted a bladder scan at 2141, 445cc urine retained.  Pt voided 200cc urine immediately after scan.  Post-void scan revealed 239cc urine in the bladder.    Re-scanned bladder at 0640, 353cc urine retained.  Paged [facility] on-call provider C. Bodenheimer, NP, who ordered an in-and-out cath.  Patient however refused in-and-out cath at this time.  Patient again voided after scan, 275cc urine voided.  Post-void scan revealed 171cc urine still in the bladder.

## 2019-08-14 NOTE — TOC Progression Note (Addendum)
Transition of Care [facility]) - Progression Note    Patient Details  Name: Ronnie Snyder MRN: [id number] Date of Birth: 01-Jul-1940  Transition of Care [facility]) CM/SW Contact  Bartholomew Crews, RN Phone Number: [phone] 08/14/2019, 1:03 PM  Clinical Narrative:    Notified by nursing of patient's sister's concerns which patient had also validated. NCM spoke with patient on the room phone - patient states that he is the primary caregiver for his spouse who for about the last 5 years has suffered from dementia and is forgetful. Patient is asking for assistance maybe 3 times a week with meal prep or even meal delivery or assistance with simple household chores. Patient stating that taking care care of his wife has "about wore me out." Patient states that they don't qualify for medicaid. Discussed home care agencies which would be private pay vs looking into ALF vs ILF. Patient expressed appreciation for the call and asked for time to think about things. He verbalized permission to speak with his sister.   Received call back from patient's sister, Ronnie Snyder, and discussed patient needs at home. Ronnie Snyder stated that patient was taking his wife to an adult day care center about 2 days a week, however, his wife demonstrated verbally aggressive behaviors about continued attendance at day care center, so patient quit taking her. Discussed with Ronnie Snyder about contacting [employer] which may also include information on meals on wheels. Ronnie Snyder was already familiar with [employer], and will follow up. Discussed interviewing and hiring a home care agency to provide needed assistance at a private pay cost - advised that potential cost out of pocket would average $25/hr and usually require blocks of time for at least 3-4 hours depending on agency. Also discussed option of patient and spouse seeking alternate housing options in either an independent vs assisted living environment. Ronnie Snyder expressed agreement. Advised  that patient will have [place] from [facility], but these skilled services will not help with supervision or chores that are needed. When [place] orders provided, please include SW for continued assistance with community resources.   Voicemail left for patient's son, Ronnie Snyder, with NCM contact information. Update: Received call back from [place]. Reviewed resources as discussed with Ronnie Snyder. Scott verbalized understanding but could not talk too long d/t his mom becoming agitated that he was on the phone.   TOC team following for transition needs.    Expected Discharge Plan: [place] Barriers to Discharge: Continued Medical Work up  Expected Discharge Plan and Services Expected Discharge Plan: [place] Choice: [place] arrangements for the past 2 months: Single Family Home                           HH Arranged: PT, OT [place] Agency: [facility] Date [place]: 08/13/19 Time HH Agency Contacted: 1000 Representative spoke with at [place]: [place] ([place]) Interventions    Readmission Risk Interventions No flowsheet data found.

## 2019-08-15 LAB — COMPREHENSIVE METABOLIC PANEL
ALT: 57 U/L — ABNORMAL HIGH (ref 0–44)
AST: 24 U/L (ref 15–41)
Albumin: 3 g/dL — ABNORMAL LOW (ref 3.5–5.0)
Alkaline Phosphatase: 53 U/L (ref 38–126)
Anion gap: 7 (ref 5–15)
BUN: 9 mg/dL (ref 8–23)
CO2: 31 mmol/L (ref 22–32)
Calcium: 8.4 mg/dL — ABNORMAL LOW (ref 8.9–10.3)
Chloride: 96 mmol/L — ABNORMAL LOW (ref 98–111)
Creatinine, Ser: 0.72 mg/dL (ref 0.61–1.24)
GFR calc Af Amer: >60 mL/min (ref >60)
GFR calc non Af Amer: >60 mL/min (ref >60)
Glucose, Bld: 103 mg/dL — ABNORMAL HIGH (ref 70–99)
Potassium: 3.9 mmol/L (ref 3.5–5.1)
Sodium: 134 mmol/L — ABNORMAL LOW (ref 135–145)
Total Bilirubin: 0.9 mg/dL (ref 0.3–1.2)
Total Protein: 4.8 g/dL — ABNORMAL LOW (ref 6.5–8.1)

## 2019-08-15 LAB — CBC
HCT: 31.1 % — ABNORMAL LOW (ref 39.0–52.0)
Hemoglobin: 8.8 g/dL — ABNORMAL LOW (ref 13.0–17.0)
MCH: 23 pg — ABNORMAL LOW (ref 26.0–34.0)
MCHC: 28.3 g/dL — ABNORMAL LOW (ref 30.0–36.0)
MCV: 81.4 fL (ref 80.0–100.0)
Platelets: 135 10*3/uL — ABNORMAL LOW (ref 150–400)
RBC: 3.82 MIL/uL — ABNORMAL LOW (ref 4.22–5.81)
RDW: 25.3 % — ABNORMAL HIGH (ref 11.5–15.5)
WBC: 5.1 10*3/uL (ref 4.0–10.5)
nRBC: 0.4 % — ABNORMAL HIGH (ref 0.0–0.2)

## 2019-08-15 LAB — BRAIN NATRIURETIC PEPTIDE: B Natriuretic Peptide: 158.5 pg/mL — ABNORMAL HIGH (ref 0.0–100.0)

## 2019-08-15 LAB — D-DIMER, QUANTITATIVE: D-Dimer, Quant: 1.68 ug/mL-FEU — ABNORMAL HIGH (ref 0.00–0.50)

## 2019-08-15 LAB — C-REACTIVE PROTEIN: CRP: 0.5 mg/dL (ref <1.0)

## 2019-08-15 LAB — MAGNESIUM: Magnesium: 1.9 mg/dL (ref 1.7–2.4)

## 2019-08-15 MED ORDER — MIDODRINE HCL 5 MG PO TABS: 5.0000 mg | ORAL_TABLET | Freq: Three times a day (TID) | ORAL | 0 refills | Status: DC

## 2019-08-15 MED ORDER — PANTOPRAZOLE SODIUM 40 MG PO TBEC: 40.0000 mg | DELAYED_RELEASE_TABLET | Freq: Two times a day (BID) | ORAL | 3 refills | Status: DC

## 2019-08-15 MED ORDER — LACTULOSE 10 GM/15ML PO SOLN: 10.0000 g | Freq: Two times a day (BID) | ORAL | 0 refills | Status: DC | PRN

## 2019-08-15 NOTE — Plan of Care (Signed)
All care plans completed for discharge

## 2019-08-15 NOTE — Discharge Instructions (Signed)
Follow with Primary MD Luetta Nutting, DO in 7 days   Get CBC, CMP, 2 view Chest X ray -  checked next visit within 1 week by Primary MD   Activity: As tolerated with Full fall precautions use walker/cane & assistance as needed  Disposition Home   Diet: Heart Healthy    Special Instructions: If you have smoked or chewed Tobacco  in the last 2 yrs please stop smoking, stop any regular Alcohol  and or any Recreational drug use.  On your next visit with your primary care physician please Get Medicines reviewed and adjusted.  Please request your Prim.MD to go over all Hospital Tests and Procedure/Radiological results at the follow up, please get all Hospital records sent to your Prim MD by signing hospital release before you go home.  If you experience worsening of your admission symptoms, develop shortness of breath, life threatening emergency, suicidal or homicidal thoughts you must seek medical attention immediately by calling 911 or calling your MD immediately  if symptoms less severe.  You Must read complete instructions/literature along with all the possible adverse reactions/side effects for all the Medicines you take and that have been prescribed to you. Take any new Medicines after you have completely understood and accpet all the possible adverse reactions/side effects.       Person Under Monitoring Name: Ronnie Snyder  Location: [place] [address]   Infection Prevention Recommendations for Individuals Confirmed to have, or Being Evaluated for, 2019 Novel Coronavirus (COVID-19) Infection Who Receive Care at Home  Individuals who are confirmed to have, or are being evaluated for, COVID-19 should follow the prevention steps below until a healthcare provider or local or state health department says they can return to normal activities.  Stay home except to get medical care You should restrict activities outside your home, except for getting medical  care. Do not go to work, school, or public areas, and do not use public transportation or taxis.  Call ahead before visiting your doctor Before your medical appointment, call the healthcare provider and tell them that you have, or are being evaluated for, COVID-19 infection. This will help the healthcare provider's office take steps to keep other people from getting infected. Ask your healthcare provider to call the local or state health department.  Monitor your symptoms Seek prompt medical attention if your illness is worsening (e.g., difficulty breathing). Before going to your medical appointment, call the healthcare provider and tell them that you have, or are being evaluated for, COVID-19 infection. Ask your healthcare provider to call the local or state health department.  Wear a facemask You should wear a facemask that covers your nose and mouth when you are in the same room with other people and when you visit a healthcare provider. People who live with or visit you should also wear a facemask while they are in the same room with you.  Separate yourself from other people in your home As much as possible, you should stay in a different room from other people in your home. Also, you should use a separate bathroom, if available.  Avoid sharing household items You should not share dishes, drinking glasses, cups, eating utensils, towels, bedding, or other items with other people in your home. After using these items, you should wash them thoroughly with soap and water.  Cover your coughs and sneezes Cover your mouth and nose with a tissue when you cough or sneeze, or you can cough or sneeze into  your sleeve. Throw used tissues in a lined trash can, and immediately wash your hands with soap and water for at least 20 seconds or use an alcohol-based hand rub.  Wash your [facility] your hands often and thoroughly with soap and water for at least 20 seconds. You can use an alcohol-based  hand sanitizer if soap and water are not available and if your hands are not visibly dirty. Avoid touching your eyes, nose, and mouth with unwashed hands.   Prevention Steps for Caregivers and Household Members of Individuals Confirmed to have, or Being Evaluated for, COVID-19 Infection Being Cared for in the Home  If you live with, or provide care at home for, a person confirmed to have, or being evaluated for, COVID-19 infection please follow these guidelines to prevent infection:  Follow healthcare provider's instructions Make sure that you understand and can help the patient follow any healthcare provider instructions for all care.  Provide for the patient's basic needs You should help the patient with basic needs in the home and provide support for getting groceries, prescriptions, and other personal needs.  Monitor the patient's symptoms If they are getting sicker, call his or her medical provider and tell them that the patient has, or is being evaluated for, COVID-19 infection. This will help the healthcare provider's office take steps to keep other people from getting infected. Ask the healthcare provider to call the local or state health department.  Limit the number of people who have contact with the patient  If possible, have only one caregiver for the patient.  Other household members should stay in another home or place of residence. If this is not possible, they should stay  in another room, or be separated from the patient as much as possible. Use a separate bathroom, if available.  Restrict visitors who do not have an essential need to be in the home.  Keep older adults, very young children, and other sick people away from the patient Keep older adults, very young children, and those who have compromised immune systems or chronic health conditions away from the patient. This includes people with chronic heart, lung, or kidney conditions, diabetes, and  cancer.  Ensure good ventilation Make sure that shared spaces in the home have good air flow, such as from an air conditioner or an opened window, weather permitting.  Wash your hands often  Wash your hands often and thoroughly with soap and water for at least 20 seconds. You can use an alcohol based hand sanitizer if soap and water are not available and if your hands are not visibly dirty.  Avoid touching your eyes, nose, and mouth with unwashed hands.  Use disposable paper towels to dry your hands. If not available, use dedicated cloth towels and replace them when they become wet.  Wear a facemask and gloves  Wear a disposable facemask at all times in the room and gloves when you touch or have contact with the patient's blood, body fluids, and/or secretions or excretions, such as sweat, saliva, sputum, nasal mucus, vomit, urine, or feces.  Ensure the mask fits over your nose and mouth tightly, and do not touch it during use.  Throw out disposable facemasks and gloves after using them. Do not reuse.  Wash your hands immediately after removing your facemask and gloves.  If your personal clothing becomes contaminated, carefully remove clothing and launder. Wash your hands after handling contaminated clothing.  Place all used disposable facemasks, gloves, and other waste in a  lined container before disposing them with other household waste.  Remove gloves and wash your hands immediately after handling these items.  Do not share dishes, glasses, or other household items with the patient  Avoid sharing household items. You should not share dishes, drinking glasses, cups, eating utensils, towels, bedding, or other items with a patient who is confirmed to have, or being evaluated for, COVID-19 infection.  After the person uses these items, you should wash them thoroughly with soap and water.  Wash laundry thoroughly  Immediately remove and wash clothes or bedding that have blood, body  fluids, and/or secretions or excretions, such as sweat, saliva, sputum, nasal mucus, vomit, urine, or feces, on them.  Wear gloves when handling laundry from the patient.  Read and follow directions on labels of laundry or clothing items and detergent. In general, wash and dry with the warmest temperatures recommended on the label.  Clean all areas the individual has used often  Clean all touchable surfaces, such as counters, tabletops, doorknobs, bathroom fixtures, toilets, phones, keyboards, tablets, and bedside tables, every day. Also, clean any surfaces that may have blood, body fluids, and/or secretions or excretions on them.  Wear gloves when cleaning surfaces the patient has come in contact with.  Use a diluted bleach solution (e.g., dilute bleach with 1 part bleach and 10 parts water) or a household disinfectant with a label that says EPA-registered for coronaviruses. To make a bleach solution at home, add 1 tablespoon of bleach to 1 quart (4 cups) of water. For a larger supply, add  cup of bleach to 1 gallon (16 cups) of water.  Read labels of cleaning products and follow recommendations provided on product labels. Labels contain instructions for safe and effective use of the cleaning product including precautions you should take when applying the product, such as wearing gloves or eye protection and making sure you have good ventilation during use of the product.  Remove gloves and wash hands immediately after cleaning.  Monitor yourself for signs and symptoms of illness Caregivers and household members are considered close contacts, should monitor their health, and will be asked to limit movement outside of the home to the extent possible. Follow the monitoring steps for close contacts listed on the symptom monitoring form.   ? If you have additional questions, contact your local health department or call the epidemiologist on call at [phone] (available 24/7). ? This  guidance is subject to change. For the most up-to-date guidance from [facility], please refer to their website: [email or web address]

## 2019-08-15 NOTE — TOC Transition Note (Signed)
Transition of Care [facility]) - CM/SW Discharge Note   Patient Details  Name: Ronnie Snyder MRN: [id number] Date of Birth: 01/27/1941  Transition [facility]) CM/SW Contact:  Bartholomew Crews, RN Phone Number: [phone] 08/15/2019, 8:38 AM   Clinical Narrative:    Notified of patient discharge orders. HH orders provided. Spooke with liaison at [facility] to advise of transition home today. No further TOC needs identified.   Final next level of care: [place] Barriers to Discharge: No Barriers Identified   Patient Goals and CMS Choice Patient states their goals for this hospitalization and ongoing recovery are:: Pt did not specifiy goals CMS Medicare.gov Compare Post Acute Care list provided to:: Patient Choice offered to / list presented to : Patient  Discharge Placement                       Discharge Plan and Services     Post Acute Care Choice: Home Health                    HH Arranged: RN, PT, Social Work [facility] Agency: [facility]) Date [place]: 08/15/19 Time [place]: [phone] Representative spoke with at [place]: [place] ([place]) Interventions     Readmission Risk Interventions No flowsheet data found.

## 2019-08-15 NOTE — Discharge Summary (Signed)
Ronnie Snyder EHO:[id number] DOB: 10/08/1940 DOA: 08/09/2019  PCP: Luetta Nutting, DO  Admit date: 08/09/2019  Discharge date: 08/15/2019  Admitted From: Home   Disposition:  Home   Recommendations for Outpatient Follow-up:   Follow up with PCP in 1-2 weeks  PCP Please obtain BMP/CBC, 2 view CXR in 1week,  (see Discharge instructions)   PCP Please follow up on the following pending results: Recheck CMP C and CMP in 1 week also monitor blood pressure and orthostatic hypotension.   Home Health: PT,RN, SW   Equipment/Devices: None  Consultations: Neuro, GI Discharge Condition: Stable    CODE STATUS: Full    Diet Recommendation: Heart Healthy    Diet Order            Diet - low sodium heart healthy        Diet Heart Room service appropriate? Yes; Fluid consistency: Thin  Diet effective now               CC - Cough   Brief history of present illness from the day of admission and additional interim summary    Patient is a 79 y.o. male with PMHx of PAD, AAA hypothyroidism, HTN-who presented with weakness and confusion-was found to have a hemoglobin of 4.8, elevated liver enzymes-and acute hypoxic respiratory failure requiring oxygen supplementation.  He received 2 units of PRBC-and was subsequently admitted to the hospitalist service.  Subsequently his Covid PCR came back positive (vaccinated-second shot earlier this month).  See below for further details.  Significant Events: 3/22>> admit for severe anemia likely from recent GI bleeding, hypoxemia-Covid positive and confusion. 3/23>> MRI brain-small right frontal infarct                                                                 Hospital Course   Acute Hypoxic Resp Failure: Acute due to combination of fluid overload and severe anemia, COVID-19  likely was incidental, has finished his steroid dose and is finishing his remdesivir course.  Of note his CRP was never elevated and he had been fully vaccinated prior to this hospitalization.  Now completely symptom-free on room air.    Recent Labs  Lab 08/09/19 2139 08/10/19 0627 08/10/19 0747 08/10/19 0747 08/11/19 0354 08/12/19 0412 08/13/19 0522 08/14/19 0335 08/15/19 0251  CRP  --   --  0.6   < > 0.6 <0.5 <0.5 <0.5 0.5  DDIMER  --   --  0.99*   < > 1.44* 1.62* 1.61* 1.55* 1.68*  FERRITIN  --  5*  --   --  22* 84 246 294  --   BNP  --  827.2*  --   --   --   --   --  158.1* 158.5*  PROCALCITON  --   --  <0.10  --   --   --   --   --   --  SARSCOV2NAA POSITIVE*  --   --   --   --   --   --   --   --    < > = values in this interval not displayed.    Hepatic Function Latest Ref Rng & Units 08/15/2019 08/14/2019 08/13/2019  Total Protein 6.5 - 8.1 g/dL 4.8(L) 5.0(L) 5.0(L)  Albumin 3.5 - 5.0 g/dL 3.0(L) 3.1(L) 3.0(L)  AST 15 - 41 U/L _0 ALT 0 - 44 U/L 57(H) 67(H) 86(H)  Alk Phosphatase 38 - 126 U/L 53 56 60  Total Bilirubin 0.3 - 1.2 mg/dL 0.9 0.6 0.8  Bilirubin, Direct 0.0 - 0.3 mg/dL - - -    Transaminitis: Likely from Covid infection-or shock liver due to severe anemia.  Improving-LFTs downtrending.  Hepatitis B/hepatitis C serology negative.  PCP to repeat CBC and CMP in 1 week.  Recent lower GI bleeding with acute blood loss anemia: Acknowledges melanotic stools last week-is microcytic-and iron deficient.  Hemoglobin now stable after 4 units of PRBC transfusion.  Also received 1 dose of IV iron.  GI recommending outpatient endoscopic evaluation.  Maintain on PPI.  Will follow with GI outpatient.  Acute metabolic encephalopathy:  Completely resolved.  Acute right frontal lobe infarct: MRI brain ordered by admitting MD showed a right frontal lobe infarct.  Does not have any deficits.  CTA head and neck completed-has approximately 70% stenosis in the left ICA, echo  with preserved EF without any obvious embolic source.  LDL 48.  A1c 5.7.  After discussion with gastroenterology-we have resumed Plavix and Pletal.  Gastroenterology recommends that patient stay on PPI twice daily for now.    Outpatient neurology follow-up post discharge is recommended.  Orthostatic hypotension: Hydrated on 08/14/2019, still mildly orthostatics but much improved on midodrine and TED stockings, symptom-free, will be discharged on midodrine with PCP follow-up.  HTN: Placated by orthostatic hypotension.  Blood pressure supine is stable, currently requiring midodrine for orthostasis.  PCP to monitor.  PAD: Stable-given recent history of GI bleeding and severity of anemia-Plavix and Pletal was held-has been resumed after discussion with GI MD-see above.  History of AAA: Follow-up with Dr. Trula Slade in the outpatient setting  Hypothyroidism: Continue with Synthroid  BPH: Continue Flomax, post void residuals now stable, outpatient follow-up with urology.     Discharge diagnosis     Principal Problem:   Acute respiratory failure with hypoxia (HCC) Active Problems:   Essential hypertension   AAA (abdominal aortic aneurysm) (HCC)   PAD (peripheral artery disease) (HCC)   Symptomatic anemia   Cerebral thrombosis with cerebral infarction    Discharge instructions    Discharge Instructions    Ambulatory referral to Neurology   Complete by: As directed    Follow up with stroke clinic NP (Jessica Vanschaick or Cecille Rubin, if both not available, consider Zachery Dauer, or Ahern) at [facility] in about 4 weeks. Thanks.   Ambulatory referral to Vascular Surgery   Complete by: As directed    Left ICA bulb 70% stenosis, asymptomatic   Diet - low sodium heart healthy   Complete by: As directed    Discharge instructions   Complete by: As directed    Follow with Primary MD Luetta Nutting, DO in 7 days   Get CBC, CMP, 2 view Chest X ray -  checked next visit within 1 week by  Primary MD   Activity: As tolerated with Full fall precautions use walker/cane & assistance as needed  Disposition Home  Diet: Heart Healthy    Special Instructions: If you have smoked or chewed Tobacco  in the last 2 yrs please stop smoking, stop any regular Alcohol  and or any Recreational drug use.  On your next visit with your primary care physician please Get Medicines reviewed and adjusted.  Please request your Prim.MD to go over all Hospital Tests and Procedure/Radiological results at the follow up, please get all Hospital records sent to your Prim MD by signing hospital release before you go home.  If you experience worsening of your admission symptoms, develop shortness of breath, life threatening emergency, suicidal or homicidal thoughts you must seek medical attention immediately by calling 911 or calling your MD immediately  if symptoms less severe.  You Must read complete instructions/literature along with all the possible adverse reactions/side effects for all the Medicines you take and that have been prescribed to you. Take any new Medicines after you have completely understood and accpet all the possible adverse reactions/side effects.   Increase activity slowly   Complete by: As directed       Discharge Medications   Allergies as of 08/15/2019      Reactions   Other    UNSPECIFIED REACTION  "ALL PAINS/NARCOTIC MEDS-CANNOT TOLERATE WELL" > per Med History prior to 05/23/17   Atorvastatin Nausea Only   Penicillins Rash   Has patient had a PCN reaction causing immediate rash, facial/tongue/throat swelling, SOB or lightheadedness with hypotension: Unknown Has patient had a PCN reaction causing severe rash involving mucus membranes or skin necrosis: No Has patient had a PCN reaction that required hospitalization: No Has patient had a PCN reaction occurring within the last 10 years: No If all of the above answers are "NO", then may proceed with Cephalosporin use.    Pioglitazone Nausea Only   Vicodin [hydrocodone-acetaminophen] Nausea Only      Medication List    STOP taking these medications   losartan 100 MG tablet Commonly known as: COZAAR     TAKE these medications   ALPRAZolam 0.25 MG tablet Commonly known as: XANAX TAKE 1 TABLET(0.25 MG) BY MOUTH TWICE DAILY AS NEEDED FOR ANXIETY   amLODipine 2.5 MG tablet Commonly known as: NORVASC Take 1 tablet (2.5 mg total) by mouth daily.   cilostazol 100 MG tablet Commonly known as: PLETAL Take 1 tablet (100 mg total) by mouth 2 (two) times daily before a meal.   clopidogrel 75 MG tablet Commonly known as: PLAVIX TAKE 1 TABLET(75 MG) BY MOUTH DAILY What changed: See the new instructions.   diclofenac Sodium 1 % Gel Commonly known as: Voltaren Apply 2 g topically 4 (four) times daily.   lactulose 10 GM/15ML solution Commonly known as: CHRONULAC Take 15 mLs (10 g total) by mouth 2 (two) times daily as needed for mild constipation.   levothyroxine 75 MCG tablet Commonly known as: SYNTHROID Take 1qd (Plz sched visit with new provider) What changed:   how much to take  how to take this  when to take this  additional instructions   midodrine 5 MG tablet Commonly known as: PROAMATINE Take 1 tablet (5 mg total) by mouth 3 (three) times daily with meals.   pantoprazole 40 MG tablet Commonly known as: PROTONIX Take 1 tablet (40 mg total) by mouth 2 (two) times daily. Can switch to any approved PPI.   tamsulosin 0.4 MG Caps capsule Commonly known as: FLOMAX Take 1 capsule (0.4 mg total) by mouth daily.       Follow-up Information  [facility]. Schedule an appointment as soon as possible for a visit in 4 week(s).   Specialty: Neurology Contact information: [address] [place] [phone]       Luetta Nutting, DO. Schedule an appointment as soon as possible for a visit in 1 week(s).   Specialty: Family  Medicine Contact information: [place] [address] [phone]        Irene Shipper, MD. Schedule an appointment as soon as possible for a visit in 1 week(s).   Specialty: Gastroenterology Contact information: [address] [place] [address] [phone]        Home, Medi Follow up.   Why: the office will call to schedule visits for nursing, physical therapy, and social work Contact information: [place] [address] [phone]        Alexis Frock, MD. Schedule an appointment as soon as possible for a visit in 1 week(s).   Specialty: Urology Contact information: [place] [address] [phone]           Major procedures and Radiology Reports - PLEASE review detailed and final reports thoroughly  -       CT ANGIO HEAD W OR WO CONTRAST  Result Date: 08/12/2019 CLINICAL DATA:  Stroke follow-up EXAM: CT ANGIOGRAPHY HEAD AND NECK TECHNIQUE: Multidetector CT imaging of the head and neck was performed using the standard protocol during bolus administration of intravenous contrast. Multiplanar CT image reconstructions and MIPs were obtained to evaluate the vascular anatomy. Carotid stenosis measurements (when applicable) are obtained utilizing NASCET criteria, using the distal internal carotid diameter as the denominator. CONTRAST:  17m OMNIPAQUE IOHEXOL 350 MG/ML SOLN COMPARISON:  Brain MRI from 2 days ago FINDINGS: CT HEAD FINDINGS Brain: Patient's acute infarct is too small for visualization on this study. No evidence of interval infarct or hemorrhage. No hydrocephalus or collection. Vascular: See below. Skull: Negative Sinuses: Negative Orbits: Negative Review of the MIP images confirms the above findings CTA NECK FINDINGS Aortic arch: Atherosclerotic plaque. The great vessel origins are incompletely covered. Right carotid system: Brachiocephalic atherosclerosis without stenosis. Heavily calcified ICA bulb  without ulceration or flow limiting stenosis. There is subjectively moderate ECA origin stenosis. Negative for vessel beading. Left carotid system: Bulky atherosclerotic plaque at the bulb with 70% luminal stenosis. No dissection or beading seen distally. Vertebral arteries: Proximal subclavian atherosclerosis without flow limiting stenosis. Robust flow in the bilateral vertebral arteries. Skeleton: No acute or aggressive finding. Multilevel thoracic ankylosis from bridging osteophytes or syndesmophytes. Other neck: No evidence of mass or inflammation. Upper chest: Mucus/debris in the lower trachea. Review of the MIP images confirms the above findings CTA HEAD FINDINGS Anterior circulation: Heavily calcified carotid siphons with 55% stenosis at the paraclinoid segment on the right. No branch occlusion, beading, or aneurysm. There is atherosclerotic irregularity of medium size vessels. Posterior circulation: V4 segment calcified plaque without flow limiting stenosis. The basilar is smooth and widely patent. Hypoplastic right P1 segment. No branch occlusion, beading, or aneurysm. Venous sinuses: Diffusely patent Anatomic variants: As above Review of the MIP images confirms the above findings IMPRESSION: 1. No emergent finding. 2. Advanced cervical carotid atherosclerosis with up to 70% stenosis at the left bulb. 3. 55% stenosis at the right paraclinoid ICA. Electronically Signed   By: JMonte FantasiaM.D.   On: 08/12/2019 06:24   CT ANGIO NECK W OR WO CONTRAST  Result Date: 08/12/2019 CLINICAL DATA:  Stroke follow-up EXAM: CT ANGIOGRAPHY HEAD AND NECK TECHNIQUE: Multidetector CT imaging of the head and neck was performed using the standard protocol during bolus administration of intravenous contrast. Multiplanar CT image reconstructions and MIPs were obtained to evaluate the vascular anatomy. Carotid stenosis measurements (when applicable) are obtained utilizing NASCET criteria, using the distal internal carotid  diameter as the denominator. CONTRAST:  68m OMNIPAQUE IOHEXOL 350 MG/ML SOLN COMPARISON:  Brain MRI from 2 days ago FINDINGS: CT HEAD FINDINGS Brain: Patient's acute infarct is too small for visualization on this study. No evidence of interval infarct or hemorrhage. No hydrocephalus or collection. Vascular: See below. Skull: Negative Sinuses: Negative Orbits: Negative Review of the MIP images confirms the above findings CTA NECK FINDINGS Aortic arch: Atherosclerotic plaque. The great vessel origins are incompletely covered. Right carotid system: Brachiocephalic atherosclerosis without stenosis. Heavily calcified ICA bulb without ulceration or flow limiting stenosis. There is subjectively moderate ECA origin stenosis. Negative for vessel beading. Left carotid system: Bulky atherosclerotic plaque at the bulb with 70% luminal stenosis. No dissection or beading seen distally. Vertebral arteries: Proximal subclavian atherosclerosis without flow limiting stenosis. Robust flow in the bilateral vertebral arteries. Skeleton: No acute or aggressive finding. Multilevel thoracic ankylosis from bridging osteophytes or syndesmophytes. Other neck: No evidence of mass or inflammation. Upper chest: Mucus/debris in the lower trachea. Review of the MIP images confirms the above findings CTA HEAD FINDINGS Anterior circulation: Heavily calcified carotid siphons with 55% stenosis at the paraclinoid segment on the right. No branch occlusion, beading, or aneurysm. There is atherosclerotic irregularity of medium size vessels. Posterior circulation: V4 segment calcified plaque without flow limiting stenosis. The basilar is smooth and widely patent. Hypoplastic right P1 segment. No branch occlusion, beading, or aneurysm. Venous sinuses: Diffusely patent Anatomic variants: As above Review of the MIP images confirms the above findings IMPRESSION: 1. No emergent finding. 2. Advanced cervical carotid atherosclerosis with up to 70% stenosis at  the left bulb. 3. 55% stenosis at the right paraclinoid ICA. Electronically Signed   By: JMonte FantasiaM.D.   On: 08/12/2019 06:24   CT ANGIO CHEST PE W OR WO CONTRAST  Result Date: 08/13/2019 CLINICAL DATA:  Shortness of breath.  COVID-19 positive EXAM: CT ANGIOGRAPHY CHEST WITH CONTRAST TECHNIQUE: Multidetector CT imaging of the chest was performed using the standard protocol during bolus administration of intravenous contrast. Multiplanar CT image reconstructions and MIPs were obtained to evaluate the vascular anatomy. CONTRAST:  1046mOMNIPAQUE IOHEXOL 350 MG/ML SOLN COMPARISON:  Chest radiograph August 10, 2019 FINDINGS: Cardiovascular: There is no demonstrable pulmonary embolus. There is no thoracic aortic aneurysm or dissection. There are foci calcification throughout visualized proximal great vessels as well as foci of aortic atherosclerosis. Multiple foci of coronary artery calcification noted. There is left ventricular hypertrophy. There is no pericardial effusion or pericardial thickening. Mediastinum/Nodes: Thyroid appears unremarkable. There is no appreciable thoracic adenopathy. There are no evident esophageal lesions. Lungs/Pleura: There are moderate free-flowing pleural effusions bilaterally. There is a degree of underlying centrilobular emphysematous change. There is consolidation in the right lower lobe and to a lesser extent in the left lower lobe posteriorly. Some of this consolidation is likely due to compressive atelectasis. On axial slice 55 series 7, there is a 5 mm nodular opacity in the medial segment of the right lower lobe. Upper Abdomen: In the visualized upper abdomen, there is aortic and major mesenteric arterial vascular calcification. There is a degree of ectasia in the visualized upper abdominal aorta. Visualized upper abdominal  structures otherwise appear unremarkable. Musculoskeletal: There is degenerative change in the thoracic spine with increased kyphosis. No blastic or  lytic bone lesions are evident. There are no evident chest wall lesions. Review of the MIP images confirms the above findings. IMPRESSION: 1. No demonstrable pulmonary embolus. No thoracic aortic aneurysm or dissection. There is extensive aortic atherosclerosis as well as foci of great vessel and coronary artery calcification. There is left ventricular hypertrophy. 2. Moderate free-flowing pleural effusions bilaterally with airspace opacity in the lung bases which in part is due to compressive atelectasis. Suspect probable superimposed pneumonia in the bases, more notable on the right than the left. There is a degree of underlying centrilobular emphysematous change. 3. 5 mm nodular opacity in the medial segment right lower lobe. No follow-up needed if patient is low-risk. Non-contrast chest CT can be considered in 12 months if patient is high-risk. This recommendation follows the consensus statement: Guidelines for Management of Incidental Pulmonary Nodules Detected on CT Images: From the Fleischner Society 2017; Radiology 2017; 284:228-243. 4.  No evident adenopathy. Aortic Atherosclerosis (ICD10-I70.0) and Emphysema (ICD10-J43.9). Electronically Signed   By: Lowella Grip III M.D.   On: 08/13/2019 13:38   MR BRAIN WO CONTRAST  Result Date: 08/10/2019 CLINICAL DATA:  Initial evaluation for acute ataxia, stroke suspected. EXAM: MRI HEAD WITHOUT CONTRAST TECHNIQUE: Multiplanar, multiecho pulse sequences of the brain and surrounding structures were obtained without intravenous contrast. COMPARISON:  None available. FINDINGS: Brain: Examination degraded by motion artifact. Generalized age-related cerebral atrophy with mild chronic small vessel ischemic disease. There is a small 8 mm focus of restricted diffusion involving the right frontal periventricular white matter adjacent to the right frontal horn, consistent with a small acute ischemic small vessel type infarct (series 2, image 27). No associated  hemorrhage or mass effect. No other abnormal foci of restricted diffusion to suggest acute or subacute ischemia. Gray-white matter differentiation otherwise maintained. No encephalomalacia to suggest chronic cortical infarction. No foci of susceptibility artifact to suggest acute or chronic intracranial hemorrhage. No mass lesion, midline shift or mass effect. No hydrocephalus or extra-axial fluid collection. Pituitary gland and suprasellar region within normal limits. Midline structures intact. Vascular: Major intracranial vascular flow voids are maintained. Skull and upper cervical spine: Craniocervical junction within normal limits. Bone marrow signal intensity normal. No scalp soft tissue abnormality. Sinuses/Orbits: Globes and orbital soft tissues within normal limits. Paranasal sinuses are clear. Small bilateral mastoid effusions noted, of doubtful significance. Trace layering fluid noted layering within the nasopharynx as well. Other: None. IMPRESSION: 1. 8 mm acute ischemic nonhemorrhagic small vessel type infarct involving the right frontal periventricular white matter as above. 2. No other acute intracranial abnormality. 3. Mild age-related cerebral atrophy with chronic small vessel ischemic disease. Electronically Signed   By: Jeannine Boga M.D.   On: 08/10/2019 19:05   CT ABDOMEN PELVIS W CONTRAST  Result Date: 08/10/2019 CLINICAL DATA:  Abdominal pain. EXAM: CT ABDOMEN AND PELVIS WITH CONTRAST TECHNIQUE: Multidetector CT imaging of the abdomen and pelvis was performed using the standard protocol following bolus administration of intravenous contrast. CONTRAST:  14m OMNIPAQUE IOHEXOL 300 MG/ML  SOLN COMPARISON:  None. FINDINGS: Lower chest: Mild to moderate severity areas of atelectasis and/or infiltrate are seen within the bilateral lung bases. There are small bilateral pleural effusions. Hepatobiliary: No focal liver abnormality is seen. No gallstones, gallbladder wall thickening, or  biliary dilatation. Pancreas: Unremarkable. No pancreatic ductal dilatation or surrounding inflammatory changes. Spleen: Normal in size without focal abnormality. Adrenals/Urinary Tract: Adrenal  glands are unremarkable. Kidneys are normal, without renal calculi, focal lesion, or hydronephrosis. Bladder is unremarkable. Stomach/Bowel: Stomach is within normal limits. Appendix appears normal. No evidence of bowel wall thickening, distention, or inflammatory changes. Noninflamed diverticula are seen throughout the descending and sigmoid colon. Vascular/Lymphatic: There is marked severity calcification and tortuosity of the abdominal aorta. 3.7 cm x 3.1 cm aneurysmal dilatation of the infrarenal abdominal aorta is seen. No enlarged abdominal or pelvic lymph nodes. Reproductive: Prostate is unremarkable. Other: No abdominal wall hernia or abnormality. No abdominopelvic ascites. Musculoskeletal: Multilevel degenerative changes seen throughout the lumbar spine. A metallic density compression screw device is seen within the proximal right femur. IMPRESSION: 1. Mild to moderate severity bibasilar atelectasis and/or infiltrate with small bilateral pleural effusions. 2. Noninflamed diverticula throughout the descending and sigmoid colon. 3. Infrarenal abdominal aortic aneurysm measuring 3.7 cm x 3.1 cm. Recommend followup by ultrasound in 3 years. This recommendation follows ACR consensus guidelines: White Paper of the ACR Incidental Findings Committee II on Vascular Findings. J Am Coll Radiol 2013; 10: Aortic Atherosclerosis (ICD10-I70.0). Electronically Signed   By: Virgina Norfolk M.D.   On: 08/10/2019 21:32   DG Chest Port 1 View  Result Date: 08/10/2019 CLINICAL DATA:  Shortness of breath. EXAM: PORTABLE CHEST 1 VIEW COMPARISON:  08/09/2019 FINDINGS: The patient is rotated to the right with grossly unchanged cardiomediastinal silhouette. Aortic atherosclerosis is noted. There is increasing opacity in the right lung  base extending into the right midlung likely reflecting a veiling pleural effusion and airspace consolidation. New mild opacity is present in the left lung base. No pneumothorax is identified. IMPRESSION: 1. Increasing right pleural effusion and right lung consolidation concerning for pneumonia. 2. New mild left basilar atelectasis or pneumonia. Electronically Signed   By: Logan Bores M.D.   On: 08/10/2019 09:40   DG Chest Port 1 View  Result Date: 08/09/2019 CLINICAL DATA:  Cough, progressive weakness, symptoms for 6 weeks EXAM: PORTABLE CHEST 1 VIEW COMPARISON:  12/17/2018 FINDINGS: Single frontal view of the chest demonstrates an unremarkable cardiac silhouette. There is right basilar consolidation and small right pleural effusion. No pneumothorax. Left chest is clear. No acute bony abnormalities. IMPRESSION: 1. Right basilar consolidation and small right pleural effusion. Findings could reflect bronchopneumonia. Followup PA and lateral chest X-ray is recommended in 3-4 weeks following trial of antibiotic therapy to ensure resolution. Electronically Signed   By: Randa Ngo M.D.   On: 08/09/2019 21:31   ECHOCARDIOGRAM LIMITED  Result Date: 08/10/2019    ECHOCARDIOGRAM LIMITED REPORT   Patient Name:   ALIOUNE HODGKIN Date of Exam: 08/10/2019 Medical Rec #:  [id number]            Height:       69.0 in Accession #:    [id number]           Weight:       144.0 lb Date of Birth:  25-Jul-1940            BSA:          1.797 m Patient Age:    79 years             BP:           132/65 mmHg Patient Gender: M                    HR:           89 bpm. Exam Location:  Inpatient Procedure: Limited Echo, Cardiac  Doppler and Limited Color Doppler Indications:    Acute Respiratory Insufficiency 518.82 / R06.89  History:        Patient has no prior history of Echocardiogram examinations.                 PAD; Risk Factors:Hypertension, Dyslipidemia and GERD. COVID-19                 Positive. Cancer. Abdominal Aortic  Aneurysm.  Sonographer:    Jonelle Sidle Dance Referring Phys: [place]  1. Left ventricular ejection fraction, by estimation, is 60 to 65%. The left ventricle has normal function. The left ventricle has no regional wall motion abnormalities. Left ventricular diastolic function could not be evaluated.  2. Right ventricular systolic function is normal. The right ventricular size is mildly enlarged. There is moderately elevated pulmonary artery systolic pressure. The estimated right ventricular systolic pressure is 69.6 mmHg.  3. Left atrial size was moderately dilated.  4. Right atrial size was mildly dilated.  5. The mitral valve is degenerative. Mild mitral valve regurgitation. No evidence of mitral stenosis.  6. The aortic valve has an indeterminant number of cusps. Aortic valve regurgitation is not visualized. Mild to moderate aortic valve stenosis.  7. The inferior vena cava is dilated in size with <50% respiratory variability, suggesting right atrial pressure of 15 mmHg. FINDINGS  Left Ventricle: Left ventricular ejection fraction, by estimation, is 60 to 65%. The left ventricle has normal function. The left ventricle has no regional wall motion abnormalities. The left ventricular internal cavity size was normal in size. There is  no left ventricular hypertrophy. Left ventricular diastolic function could not be evaluated due to mitral annular calcification (moderate or greater). Right Ventricle: The right ventricular size is mildly enlarged. No increase in right ventricular wall thickness. Right ventricular systolic function is normal. There is moderately elevated pulmonary artery systolic pressure. The tricuspid regurgitant velocity is 2.96 m/s, and with an assumed right atrial pressure of 15 mmHg, the estimated right ventricular systolic pressure is 78.9 mmHg. Left Atrium: Left atrial size was moderately dilated. Right Atrium: Right atrial size was mildly dilated. Mitral Valve: The mitral  valve is degenerative in appearance. Moderate mitral annular calcification. Mild mitral valve regurgitation. No evidence of mitral valve stenosis. Tricuspid Valve: The tricuspid valve is grossly normal. Tricuspid valve regurgitation is mild . No evidence of tricuspid stenosis. Aortic Valve: The aortic valve has an indeterminant number of cusps. . There is severe thickening and severe calcifcation of the aortic valve. Aortic valve regurgitation is not visualized. Mild to moderate aortic stenosis is present. There is severe thickening of the aortic valve. There is severe calcifcation of the aortic valve. Aortic valve mean gradient measures 16.0 mmHg. Aortic valve peak gradient measures 32.3 mmHg. Aortic valve area, by VTI measures 1.57 cm. Pulmonic Valve: The pulmonic valve was grossly normal. Pulmonic valve regurgitation is not visualized. No evidence of pulmonic stenosis. Aorta: The aortic root is normal in size and structure. Venous: The inferior vena cava is dilated in size with less than 50% respiratory variability, suggesting right atrial pressure of 15 mmHg. IAS/Shunts: No atrial level shunt detected by color flow Doppler.  LEFT VENTRICLE PLAX 2D LVIDd:         4.81 cm LVIDs:         4.02 cm LV PW:         1.12 cm LV IVS:        0.95 cm LVOT diam:  2.00 cm LV SV:         86 LV SV Index:   48 LVOT Area:     3.14 cm  RIGHT VENTRICLE          IVC RV Basal diam:  2.72 cm  IVC diam: 2.58 cm LEFT ATRIUM              Index       RIGHT ATRIUM           Index LA diam:        5.00 cm  2.78 cm/m  RA Area:     21.60 cm LA Vol (A2C):   116.0 ml 64.57 ml/m RA Volume:   66.30 ml  36.90 ml/m LA Vol (A4C):   78.6 ml  43.75 ml/m LA Biplane Vol: 94.7 ml  52.71 ml/m  AORTIC VALVE AV Area (Vmax):    1.56 cm AV Area (Vmean):   1.47 cm AV Area (VTI):     1.57 cm AV Vmax:           284.00 cm/s AV Vmean:          173.000 cm/s AV VTI:            0.548 m AV Peak Grad:      32.3 mmHg AV Mean Grad:      16.0 mmHg LVOT Vmax:          141.00 cm/s LVOT Vmean:        80.700 cm/s LVOT VTI:          0.274 m LVOT/AV VTI ratio: 0.50  AORTA Ao Root diam: 3.80 cm Ao Asc diam:  3.30 cm MITRAL VALVE                TRICUSPID VALVE MV Area (PHT): 3.53 cm     TR Peak grad:   35.0 mmHg MV Decel Time: 215 msec     TR Vmax:        296.00 cm/s MV E velocity: 134.00 cm/s MV A velocity: 167.00 cm/s  SHUNTS MV E/A ratio:  0.80         Systemic VTI:  0.27 m                             Systemic Diam: 2.00 cm Eleonore Chiquito MD Electronically signed by Eleonore Chiquito MD Signature Date/Time: 08/10/2019/2:02:07 PM    Final     Micro Results    Recent Results (from the past 240 hour(s))  SARS CORONAVIRUS 2 (TAT 6-24 HRS) Nasopharyngeal Nasopharyngeal Swab     Status: Abnormal   Collection Time: 08/09/19  9:39 PM   Specimen: Nasopharyngeal Swab  Result Value Ref Range Status   SARS Coronavirus 2 POSITIVE (A) NEGATIVE Final    Comment: RESULT CALLED TO, READ BACK BY AND VERIFIED WITH: RN JEQUATA WOODY AT [place] 08/10/2019 (NOTE) SARS-CoV-2 target nucleic acids are DETECTED. The SARS-CoV-2 RNA is generally detectable in upper and lower respiratory specimens during the acute phase of infection. Positive results are indicative of the presence of SARS-CoV-2 RNA. Clinical correlation with patient history and other diagnostic information is  necessary to determine patient infection status. Positive results do not rule out bacterial infection or co-infection with other viruses.  The expected result is Negative. Fact Sheet for Patients: SugarRoll.be Fact Sheet for Healthcare Providers: https://www.woods-mathews.com/ This test is not yet approved or cleared by the [place] FDA  and  has been authorized for detection and/or diagnosis of SARS-CoV-2 by FDA under an Emergency Use Authorization (EUA). This EUA will remain  in effect (meaning this t est can be used) for the duration of  the COVID-19 declaration under Section 564(b)(1) of the Act, 21 U.S.C. section 360bbb-3(b)(1), unless the authorization is terminated or revoked sooner. Performed at [facility], [place] [address], [place], [place] [address]     Today   Subjective    Ronnie Snyder today has no headache,no chest abdominal pain,no new weakness tingling or numbness, feels much better wants to go home today.     Objective   Blood pressure 160/75, pulse 67, temperature 98.2 F (36.8 C), temperature source Oral, resp. rate 20, height _0  (1.753 m), weight 65.3 kg, SpO2 92 %.   Intake/Output Summary (Last 24 hours) at 08/15/2019 0954 Last data filed at 08/15/2019 0815 Gross per 24 hour  Intake 2373.92 ml  Output 2829 ml  Net -455.08 ml    Exam  Awake Alert, Oriented x 3, No new F.N deficits, Normal affect [place].AT,PERRAL Supple Neck,No JVD, No cervical lymphadenopathy appriciated.  Symmetrical Chest wall movement, Good air movement bilaterally, CTAB RRR,No Gallops,Rubs or new Murmurs, No Parasternal Heave +ve B.Sounds, Abd Soft, Non tender, No organomegaly appriciated, No rebound -guarding or rigidity. No Cyanosis, Clubbing or edema, No new Rash or bruise   Data Review   CBC w Diff:  Lab Results  Component Value Date   WBC 5.1 08/15/2019   HGB 8.8 (L) 08/15/2019   HCT 31.1 (L) 08/15/2019   PLT 135 (L) 08/15/2019   LYMPHOPCT 13 08/10/2019   MONOPCT 4 08/10/2019   EOSPCT 2 08/10/2019   BASOPCT 0 08/10/2019    CMP:  Lab Results  Component Value Date   NA 134 (L) 08/15/2019   K 3.9 08/15/2019   CL 96 (L) 08/15/2019   CO2 31 08/15/2019   BUN 9 08/15/2019   CREATININE 0.72 08/15/2019   PROT 4.8 (L) 08/15/2019   ALBUMIN 3.0 (L) 08/15/2019   BILITOT 0.9 08/15/2019   ALKPHOS 53 08/15/2019   AST 24 08/15/2019   ALT 57 (H) 08/15/2019  .   Total Time in preparing paper work, data evaluation and todays exam - 50 minutes  Lala Lund M.D on 08/15/2019 at 9:54 AM  [facility]   Office  [phone]

## 2019-08-16 ENCOUNTER — Encounter: Payer: Self-pay | Admitting: Gastroenterology

## 2019-08-18 ENCOUNTER — Telehealth (INDEPENDENT_AMBULATORY_CARE_PROVIDER_SITE_OTHER): Payer: Medicare Other | Admitting: Family Medicine

## 2019-08-18 ENCOUNTER — Encounter: Payer: Self-pay | Admitting: Family Medicine

## 2019-08-18 VITALS — Ht 69.0 in | Wt 130.0 lb

## 2019-08-18 DIAGNOSIS — K921 Melena: Secondary | ICD-10-CM

## 2019-08-18 DIAGNOSIS — K922 Gastrointestinal hemorrhage, unspecified: Secondary | ICD-10-CM | POA: Insufficient documentation

## 2019-08-18 DIAGNOSIS — D649 Anemia, unspecified: Secondary | ICD-10-CM

## 2019-08-18 DIAGNOSIS — J9601 Acute respiratory failure with hypoxia: Secondary | ICD-10-CM | POA: Diagnosis not present

## 2019-08-18 DIAGNOSIS — D509 Iron deficiency anemia, unspecified: Secondary | ICD-10-CM | POA: Diagnosis not present

## 2019-08-18 DIAGNOSIS — I6330 Cerebral infarction due to thrombosis of unspecified cerebral artery: Secondary | ICD-10-CM

## 2019-08-18 NOTE — Assessment & Plan Note (Signed)
Melena has resolved.  He is feeling well at this time.  Continue PPI at BID dosing.  He has f/u with GI for endoscopy.  Reviewed red flags and he should return to hospital if having bloody or dark stools.

## 2019-08-18 NOTE — Assessment & Plan Note (Signed)
Multifactorial from anemia and fluid overload.  Positive for COVID-19 but no other symptoms related to COVID with negative inflammatory markers and he had been vaccinated.  Likely incidental.

## 2019-08-18 NOTE — Progress Notes (Signed)
Ronnie Snyder - 79 y.o. male MRN [id number]  Date of birth: Feb 20, 1941   This visit type was conducted due to national recommendations for restrictions regarding the COVID-19 Pandemic (e.g. social distancing).  This format is felt to be most appropriate for this patient at this time.  All issues noted in this document were discussed and addressed.  No physical exam was performed (except for noted visual exam findings with Video Visits).  I discussed the limitations of evaluation and management by telemedicine and the availability of in person appointments. The patient expressed understanding and agreed to proceed.  I connected with@ on 08/18/19 at 10:10 AM EDT by a video enabled telemedicine application and verified that I am speaking with the correct person using two identifiers.  Interactive audio and video telecommunications were attempted between this provider and patient, however failed, due to patient having technical difficulties OR patient did not have access to video capability.  We continued and completed visit with audio only.   Present at visit: Luetta Nutting, DO Caprice Renshaw Daniel   Patient Location: Home [address] [place] [address]   Provider location:   [facility]  Chief Complaint  Patient presents with  . Hospitalization Follow-up    HPI  Ronnie Snyder is a 79 y.o. male who presents via audio/video conferencing for a telehealth visit today.  He is following up from recent hospitalization today.  He was admitted from 3/22-3/28/21 for initial complaint of confusion and some dizziness.  He was found to have anemia with Hgb of 4.8.  He admitted to melanotic stools over the past week prior to admission.  He received 4 units PRBC and iron infusion.  Hgb remained stable during remainder of hospitalization after transfusion. Placed on PPI as well. He has been scheduled with GI for outpatient endoscopy.    In addition to his confusion he was hypoxic and required  O2 supplementation.  He did swab positive for COVID-19 however inflammatory markers were never elevated and he had been fully vaccinated, so this was felt to be incidental and respiratory failure was thought to be related to fluid overload and anemia.  He did complete course of dexamethasone and remdesivir.   He was discharged on room air.   MRI did also show acute frontal lobe infarct.  Subsequent CTA of neck shows 70% stenosis of L ICA.  After bleeding had stabilized he was restarted on plavix and pletal.   He does have f/u with neurology and vascular surgeon.   Today he reports that he feels "great".  He denies dark stools, shortness of breath, dizziness or fatigue.  He has appointments with specialists already scheduled.    ROS:  A comprehensive ROS was completed and negative except as noted per HPI  Past Medical History:  Diagnosis Date  . Abdominal aortic aneurysm (HCC)    3.5 cm 10/2016 L-spine CT (previously 3.0 cm by U/S 02/25/11)  . Anxiety   . Arthritis    "in my feet" (10/13/2012)  . GERD (gastroesophageal reflux disease)   . H/O hiatal hernia   . Hepatic steatosis   . High cholesterol    "at one time; it's fine now" (10/13/2012)  . HTN (hypertension)   . Hx of colonic polyps   . Hypothyroidism   . PAD (peripheral artery disease) ([place])   . Skin cancer    "burned them off my arm and such" (10/13/2012)    Past Surgical History:  Procedure Laterality Date  . ABDOMINAL ANGIOGRAM N/A  12/04/2011   Procedure: ABDOMINAL ANGIOGRAM;  Surgeon: Sherren Mocha, MD;  Location: [facility] CATH LAB;  Service: Cardiovascular;  Laterality: N/A;  . ABDOMINAL AORTAGRAM N/A 10/13/2012   Procedure: ABDOMINAL Maxcine Ham;  Surgeon: Serafina Mitchell, MD;  Location: [facility] CATH LAB;  Service: Cardiovascular;  Laterality: N/A;  . ANGIOPLASTY / STENTING FEMORAL Left 10/13/2012  . ESOPHAGOGASTRODUODENOSCOPY    . FEMORAL ENDARTERECTOMY Left 01/23/12   Left Endarterectomy  with bovie patch Angioplasy  . gated spect wall  motion stress cardiolite  02/10/2002  . HIP FRACTURE SURGERY Right 1990's  . INGUINAL HERNIA REPAIR Right    "years ago" (10/13/2012)  . LUMBAR LAMINECTOMY/DECOMPRESSION MICRODISCECTOMY N/A 05/23/2017   Procedure: L4-5 DECOMPRESSION;  Surgeon: Marybelle Killings, MD;  Location: [place];  Service: Orthopedics;  Laterality: N/A;  . NOSE SURGERY    . TONSILLECTOMY     "I was a chld" (10/13/2012)    Family History  Problem Relation Age of Onset  . Hypertension Mother   . Hypertension Father   . Hypertension Sister   . Hypertension Sister     Social History   Socioeconomic History  . Marital status: Married    Spouse name: Not on file  . Number of children: Not on file  . Years of education: Not on file  . Highest education level: Not on file  Occupational History  . Occupation: retired    Comment: optician  Tobacco Use  . Smoking status: Former Smoker    Packs/day: 0.25    Years: 57.00    Pack years: 14.25    Types: Cigarettes  . Smokeless tobacco: Never Used  . Tobacco comment: pt states there is no hope for quitting  Substance and Sexual Activity  . Alcohol use: Not Currently  . Drug use: No  . Sexual activity: Not on file  Other Topics Concern  . Not on file  Social History Narrative  . Not on file   Social Determinants of Health   Financial Resource Strain:   . Difficulty of Paying Living Expenses:   Food Insecurity:   . Worried About Charity fundraiser in the Last Year:   . Arboriculturist in the Last Year:   Transportation Needs:   . Film/video editor (Medical):   Marland Kitchen Lack of Transportation (Non-Medical):   Physical Activity:   . Days of Exercise per Week:   . Minutes of Exercise per Session:   Stress:   . Feeling of Stress :   Social Connections:   . Frequency of Communication with Friends and Family:   . Frequency of Social Gatherings with Friends and Family:   . Attends Religious Services:   . Active Member of Clubs or Organizations:   . Attends Theatre manager Meetings:   Marland Kitchen Marital Status:   Intimate Partner Violence:   . Fear of Current or Ex-Partner:   . Emotionally Abused:   Marland Kitchen Physically Abused:   . Sexually Abused:      Current Outpatient Medications:  .  ALPRAZolam (XANAX) 0.25 MG tablet, TAKE 1 TABLET(0.25 MG) BY MOUTH TWICE DAILY AS NEEDED FOR ANXIETY, Disp: 30 tablet, Rfl: 0 .  amLODipine (NORVASC) 2.5 MG tablet, Take 1 tablet (2.5 mg total) by mouth daily., Disp: 90 tablet, Rfl: 0 .  cilostazol (PLETAL) 100 MG tablet, Take 1 tablet (100 mg total) by mouth 2 (two) times daily before a meal., Disp: 60 tablet, Rfl: 11 .  clopidogrel (PLAVIX) 75 MG tablet, TAKE 1 TABLET(75  MG) BY MOUTH DAILY (Patient taking differently: Take 75 mg by mouth daily. ), Disp: 30 tablet, Rfl: 6 .  diclofenac Sodium (VOLTAREN) 1 % GEL, Apply 2 g topically 4 (four) times daily., Disp: 100 g, Rfl: 0 .  lactulose (CHRONULAC) 10 GM/15ML solution, Take 15 mLs (10 g total) by mouth 2 (two) times daily as needed for mild constipation., Disp: 236 mL, Rfl: 0 .  levothyroxine (SYNTHROID) 75 MCG tablet, Take 1qd (Plz sched visit with new provider) (Patient taking differently: Take 75 mcg by mouth daily. ), Disp: 90 tablet, Rfl: 0 .  midodrine (PROAMATINE) 5 MG tablet, Take 1 tablet (5 mg total) by mouth 3 (three) times daily with meals., Disp: 90 tablet, Rfl: 0 .  pantoprazole (PROTONIX) 40 MG tablet, Take 1 tablet (40 mg total) by mouth 2 (two) times daily. Can switch to any approved PPI., Disp: 60 tablet, Rfl: 3 .  tamsulosin (FLOMAX) 0.4 MG CAPS capsule, Take 1 capsule (0.4 mg total) by mouth daily., Disp: 30 capsule, Rfl: 3  EXAM:  VITALS per patient if applicable: Ht 5\' 9"  (1.753 m)   Wt 130 lb (59 kg)   BMI 19.20 kg/m   GENERAL: alert, oriented,no acute distress  PSYCH/NEURO: pleasant and cooperative, no obvious depression or anxiety, speech and thought processing grossly intact  ASSESSMENT AND PLAN:  Discussed the following assessment and  plan:  Symptomatic anemia Lab Results  Component Value Date   WBC 5.1 08/15/2019   HGB 8.8 (L) 08/15/2019   HCT 31.1 (L) 08/15/2019   MCV 81.4 08/15/2019   PLT 135 (L) 08/15/2019  CBC stable at time of discharge.  Discussed that we need to recheck to ensure that this is still stable. He will stop by lab for lab work.     Cerebral thrombosis with cerebral infarction Recent MRI with small stroke involving frontal lobe.  No deficits related to this. He will continue plavix.  Has f/u with vascular surgery for 70% L ICA stenosis. Has neuro f/u as well.    GI bleed Melena has resolved.  He is feeling well at this time.  Continue PPI at BID dosing.  He has f/u with GI for endoscopy.  Reviewed red flags and he should return to hospital if having bloody or dark stools.   Acute respiratory failure with hypoxia (HCC) Multifactorial from anemia and fluid overload.  Positive for COVID-19 but no other symptoms related to COVID with negative inflammatory markers and he had been vaccinated.  Likely incidental.   30 minutes spent including pre visit preparation, review of prior notes and labs, encounter with patient via video visit and same day documentation.    I discussed the assessment and treatment plan with the patient. The patient was provided an opportunity to ask questions and all were answered. The patient agreed with the plan and demonstrated an understanding of the instructions.   The patient was advised to call back or seek an in-person evaluation if the symptoms worsen or if the condition fails to improve as anticipated.    Luetta Nutting, DO

## 2019-08-18 NOTE — Progress Notes (Signed)
Patient was in hospital for what he was told that he had covid and then the was told he was anemic.  He reports that he is not sure he had the covid.  He reports he had not energy and was feeling run down He reports that he had some blood loss but not sure where from in the body.    He reports that he was taken off [place].

## 2019-08-18 NOTE — Assessment & Plan Note (Signed)
Lab Results  Component Value Date   WBC 5.1 08/15/2019   HGB 8.8 (L) 08/15/2019   HCT 31.1 (L) 08/15/2019   MCV 81.4 08/15/2019   PLT 135 (L) 08/15/2019  CBC stable at time of discharge.  Discussed that we need to recheck to ensure that this is still stable. He will stop by lab for lab work.

## 2019-08-18 NOTE — Assessment & Plan Note (Signed)
Recent MRI with small stroke involving frontal lobe.  No deficits related to this. He will continue plavix.  Has f/u with vascular surgery for 70% L ICA stenosis. Has neuro f/u as well.

## 2019-08-19 DIAGNOSIS — K760 Fatty (change of) liver, not elsewhere classified: Secondary | ICD-10-CM | POA: Insufficient documentation

## 2019-08-23 DIAGNOSIS — J189 Pneumonia, unspecified organism: Secondary | ICD-10-CM | POA: Diagnosis not present

## 2019-08-24 ENCOUNTER — Telehealth: Payer: Self-pay | Admitting: Family Medicine

## 2019-08-24 NOTE — Telephone Encounter (Signed)
Received fax for Pa on Clopidogrel Bisulfate sent through cover my meds waiting on determination. - CF

## 2019-08-25 DIAGNOSIS — D509 Iron deficiency anemia, unspecified: Secondary | ICD-10-CM | POA: Diagnosis not present

## 2019-08-25 LAB — CBC
HCT: 34.6 % — ABNORMAL LOW (ref 38.5–50.0)
Hemoglobin: 10 g/dL — ABNORMAL LOW (ref 13.2–17.1)
MCH: 24.4 pg — ABNORMAL LOW (ref 27.0–33.0)
MCHC: 28.9 g/dL — ABNORMAL LOW (ref 32.0–36.0)
MCV: 84.4 fL (ref 80.0–100.0)
MPV: 11.6 fL (ref 7.5–12.5)
Platelets: 186 10*3/uL (ref 140–400)
RBC: 4.1 10*6/uL — ABNORMAL LOW (ref 4.20–5.80)
RDW: 25.2 % — ABNORMAL HIGH (ref 11.0–15.0)
WBC: 4.8 10*3/uL (ref 3.8–10.8)

## 2019-08-25 LAB — BASIC METABOLIC PANEL
BUN/Creatinine Ratio: 16 (calc) (ref 6–22)
BUN: 10 mg/dL (ref 7–25)
CO2: 34 mmol/L — ABNORMAL HIGH (ref 20–32)
Calcium: 9.1 mg/dL (ref 8.6–10.3)
Chloride: 98 mmol/L (ref 98–110)
Creat: 0.62 mg/dL — ABNORMAL LOW (ref 0.70–1.18)
Glucose, Bld: 84 mg/dL (ref 65–139)
Potassium: 5.2 mmol/L (ref 3.5–5.3)
Sodium: 138 mmol/L (ref 135–146)

## 2019-08-30 ENCOUNTER — Ambulatory Visit (INDEPENDENT_AMBULATORY_CARE_PROVIDER_SITE_OTHER): Payer: Medicare Other | Admitting: Gastroenterology

## 2019-08-30 ENCOUNTER — Encounter: Payer: Self-pay | Admitting: Gastroenterology

## 2019-08-30 ENCOUNTER — Other Ambulatory Visit: Payer: Self-pay | Admitting: Family Medicine

## 2019-08-30 VITALS — BP 178/82 | HR 78 | Temp 97.7°F | Ht 68.0 in | Wt 136.5 lb

## 2019-08-30 DIAGNOSIS — I6330 Cerebral infarction due to thrombosis of unspecified cerebral artery: Secondary | ICD-10-CM | POA: Diagnosis not present

## 2019-08-30 DIAGNOSIS — K2270 Barrett's esophagus without dysplasia: Secondary | ICD-10-CM | POA: Insufficient documentation

## 2019-08-30 DIAGNOSIS — D509 Iron deficiency anemia, unspecified: Secondary | ICD-10-CM

## 2019-08-30 MED ORDER — AMLODIPINE BESYLATE 2.5 MG PO TABS: 2.5000 mg | ORAL_TABLET | Freq: Every day | ORAL | 1 refills | Status: DC

## 2019-08-30 NOTE — Progress Notes (Signed)
Reviewed and agree with management plan.  Daveyon Kitchings T. Gerarda Conklin, MD FACG [facility]  

## 2019-08-30 NOTE — Patient Instructions (Signed)
If you are age 78 or older, your body mass index should be between 23-30. Your Body mass index is 20.75 kg/m. If this is out of the aforementioned range listed, please consider follow up with your Primary Care Provider.  If you are age 54 or younger, your body mass index should be between 19-25. Your Body mass index is 20.75 kg/m. If this is out of the aformentioned range listed, please consider follow up with your Primary Care Provider.   Follow up in 3 months with Dr. Fuller Plan

## 2019-08-30 NOTE — Telephone Encounter (Signed)
Patient called and needed a refill of his amlodipine. It has been filled.

## 2019-08-30 NOTE — Progress Notes (Signed)
08/30/2019 Ronnie Snyder [id number] 1941-04-05   HISTORY OF PRESENT ILLNESS: This is a pleasant 79 year old male who is remotely known to Dr. Fuller Plan.  Was recently hospitalized for respiratory failure due to Covid and noted to have a mild CVA as well.  He has multiple vascular issues and is on Plavix and looks like he was on Pletal previously as well.  Hemoglobin was found to be in the 4 g range while hospitalized.  He required 4 units of packed red blood cells and received a dose of IV iron.  He was Hemoccult negative.  He was seen by GI service but procedures were deferred due to other issues and they stated that there were no plans for endoscopic evaluations in the immediate future.  He is here today for follow-up.  He tells me he is feeling well.  He is moving his bowels well.  He denies any black or bloody stools.  He tells me that prior to his hospitalization he had some very dark-colored stools, but not necessarily black per se.  Hemoglobin has continued to increase and last week as at 10 g.  He is not on oral iron supplements.  He does have a history of Barrett's esophagus and is on pantoprazole 40 mg twice daily.  His last and only EGD was in 2004 so is well overdue for surveillance.  He has never had colonoscopy.  According to the chart looks like he has declined and refused colonoscopy and EGD on several occasions over the past many years.  Of note, CT scan of the abdomen/pelvis with contrast while in the hospital were unremarkable for any concerning GI issues.   Past Medical History:  Diagnosis Date  . Abdominal aortic aneurysm (HCC)    3.5 cm 10/2016 L-spine CT (previously 3.0 cm by U/S 02/25/11)  . Anxiety   . Arthritis    "in my feet" (10/13/2012)  . GERD (gastroesophageal reflux disease)   . H/O hiatal hernia   . Hepatic steatosis   . High cholesterol    "at one time; it's fine now" (10/13/2012)  . HTN (hypertension)   . Hx of colonic polyps   . Hypothyroidism   . PAD  (peripheral artery disease) ([place])   . Skin cancer    "burned them off my arm and such" (10/13/2012)   Past Surgical History:  Procedure Laterality Date  . ABDOMINAL ANGIOGRAM N/A 12/04/2011   Procedure: ABDOMINAL ANGIOGRAM;  Surgeon: Sherren Mocha, MD;  Location: [facility] CATH LAB;  Service: Cardiovascular;  Laterality: N/A;  . ABDOMINAL AORTAGRAM N/A 10/13/2012   Procedure: ABDOMINAL Maxcine Ham;  Surgeon: Serafina Mitchell, MD;  Location: [facility] CATH LAB;  Service: Cardiovascular;  Laterality: N/A;  . ANGIOPLASTY / STENTING FEMORAL Left 10/13/2012  . ESOPHAGOGASTRODUODENOSCOPY    . FEMORAL ENDARTERECTOMY Left 01/23/12   Left Endarterectomy  with bovie patch Angioplasy  . gated spect wall motion stress cardiolite  02/10/2002  . HIP FRACTURE SURGERY Right 1990's  . INGUINAL HERNIA REPAIR Right    "years ago" (10/13/2012)  . LUMBAR LAMINECTOMY/DECOMPRESSION MICRODISCECTOMY N/A 05/23/2017   Procedure: L4-5 DECOMPRESSION;  Surgeon: Marybelle Killings, MD;  Location: [place];  Service: Orthopedics;  Laterality: N/A;  . NOSE SURGERY    . TONSILLECTOMY     "I was a chld" (10/13/2012)    reports that he has quit smoking. His smoking use included cigarettes. He has a 14.25 pack-year smoking history. He has never used smokeless tobacco. He reports previous alcohol use. He  reports that he does not use drugs. family history includes Heart disease in his father and mother; Hypertension in his father, mother, sister, and sister. Allergies  Allergen Reactions  . Atorvastatin Nausea Only  . Other Other (See Comments)    UNSPECIFIED REACTION  "ALL PAINS/NARCOTIC MEDS-CANNOT TOLERATE WELL" > per Med History prior to 05/23/17  . Penicillins Rash    Has patient had a PCN reaction causing immediate rash, facial/tongue/throat swelling, SOB or lightheadedness with hypotension: Unknown Has patient had a PCN reaction causing severe rash involving mucus membranes or skin necrosis: No Has patient had a PCN reaction that required  hospitalization: No Has patient had a PCN reaction occurring within the last 10 years: No If all of the above answers are "NO", then may proceed with Cephalosporin use.   . Pioglitazone Nausea Only  . Vicodin [Hydrocodone-Acetaminophen] Nausea Only      Outpatient Encounter Medications as of 08/30/2019  Medication Sig  . ALPRAZolam (XANAX) 0.25 MG tablet TAKE 1 TABLET(0.25 MG) BY MOUTH TWICE DAILY AS NEEDED FOR ANXIETY  . amLODipine (NORVASC) 2.5 MG tablet Take 2.5 mg by mouth daily.  . clopidogrel (PLAVIX) 75 MG tablet Take 75 mg by mouth daily.  . diclofenac Sodium (VOLTAREN) 1 % GEL Apply 2 g topically 4 (four) times daily.  Marland Kitchen lactulose (CHRONULAC) 10 GM/15ML solution Take 15 mLs (10 g total) by mouth 2 (two) times daily as needed for mild constipation.  Marland Kitchen levothyroxine (SYNTHROID) 75 MCG tablet Take 1qd (Plz sched visit with new provider)  . midodrine (PROAMATINE) 5 MG tablet Take 1 tablet (5 mg total) by mouth 3 (three) times daily with meals.  . pantoprazole (PROTONIX) 40 MG tablet Take 1 tablet (40 mg total) by mouth 2 (two) times daily. Can switch to any approved PPI.  . tamsulosin (FLOMAX) 0.4 MG CAPS capsule Take 1 capsule (0.4 mg total) by mouth daily.  . [DISCONTINUED] amLODipine (NORVASC) 2.5 MG tablet Take 1 tablet (2.5 mg total) by mouth daily. (Patient not taking: Reported on 08/30/2019)  . [DISCONTINUED] cilostazol (PLETAL) 100 MG tablet Take 1 tablet (100 mg total) by mouth 2 (two) times daily before a meal. (Patient not taking: Reported on 08/30/2019)  . [DISCONTINUED] clopidogrel (PLAVIX) 75 MG tablet TAKE 1 TABLET(75 MG) BY MOUTH DAILY (Patient not taking: No sig reported)   No facility-administered encounter medications on file as of 08/30/2019.     REVIEW OF SYSTEMS  : All other systems reviewed and negative except where noted in the History of Present Illness.   PHYSICAL EXAM: BP (!) 178/82 (BP Location: Right Arm, Patient Position: Sitting, Cuff Size: Normal)    Pulse 78   Temp 97.7 F (36.5 C)   Ht 5\' 8"  (1.727 m)   Wt 136 lb 8 oz (61.9 kg)   SpO2 95%   BMI 20.75 kg/m  General: Well developed white male in no acute distress Head: Normocephalic and atraumatic Eyes:  Sclerae anicteric, conjunctiva pink. Ears: Normal auditory acuity Lungs: Clear throughout to auscultation; no increased WOB. Heart: Regular rate and rhythm; no M/R/G. Abdomen: Soft, non-distended.  BS present.  Non-tender. Musculoskeletal: Symmetrical with no gross deformities  Skin: No lesions on visible extremities Extremities: No edema  Neurological: Alert oriented x 4, grossly non-focal Psychological:  Alert and cooperative. Normal mood and affect  ASSESSMENT AND PLAN: *79 year old male recently hospitalized with Covid and a mild CVA.  Has extensive vascular history and is on Plavix.  He was anemic with a hemoglobin in the 4  g range requiring 4 units of packed red blood cells and IV iron while in the hospital.  Was Hemoccult negative.  Hemoglobin has continued to increase.  He feels well.  He will need EGD and colonoscopy at some point, but I think with his recent issues it would be unsafe to perform and also unsure the which should stop his Plavix at this time.  Recommend for his PCP continue to monitor his hemoglobin periodically.  We will see him back in 2 to 3 months to rediscuss at that time. *GERD and history of Barrett's in 2004:  Continue pantoprazole 40 mg BID for now.  Well overdue for surveillance.  CC:  Luetta Nutting, DO

## 2019-09-07 DIAGNOSIS — R351 Nocturia: Secondary | ICD-10-CM | POA: Diagnosis not present

## 2019-09-07 DIAGNOSIS — N401 Enlarged prostate with lower urinary tract symptoms: Secondary | ICD-10-CM | POA: Diagnosis not present

## 2019-09-07 NOTE — Telephone Encounter (Signed)
Received fax from [facility] and they approved Clopidogrel until 05/19/20. - CF

## 2019-09-14 ENCOUNTER — Encounter: Payer: Self-pay | Admitting: Family Medicine

## 2019-09-14 NOTE — Telephone Encounter (Signed)
Not written by you last- please advise

## 2019-09-15 ENCOUNTER — Encounter: Payer: Self-pay | Admitting: Family Medicine

## 2019-09-15 ENCOUNTER — Other Ambulatory Visit: Payer: Self-pay | Admitting: Family Medicine

## 2019-09-15 ENCOUNTER — Other Ambulatory Visit: Payer: Self-pay

## 2019-09-16 ENCOUNTER — Encounter: Payer: Self-pay | Admitting: Adult Health

## 2019-09-16 ENCOUNTER — Other Ambulatory Visit: Payer: Self-pay

## 2019-09-16 ENCOUNTER — Other Ambulatory Visit: Payer: Self-pay | Admitting: Family Medicine

## 2019-09-16 ENCOUNTER — Ambulatory Visit (INDEPENDENT_AMBULATORY_CARE_PROVIDER_SITE_OTHER): Payer: Medicare Other | Admitting: Adult Health

## 2019-09-16 VITALS — BP 150/90 | HR 84 | Temp 97.8°F | Ht 68.0 in | Wt 139.6 lb

## 2019-09-16 DIAGNOSIS — I10 Essential (primary) hypertension: Secondary | ICD-10-CM

## 2019-09-16 DIAGNOSIS — E785 Hyperlipidemia, unspecified: Secondary | ICD-10-CM | POA: Diagnosis not present

## 2019-09-16 DIAGNOSIS — I6523 Occlusion and stenosis of bilateral carotid arteries: Secondary | ICD-10-CM

## 2019-09-16 DIAGNOSIS — I639 Cerebral infarction, unspecified: Secondary | ICD-10-CM | POA: Diagnosis not present

## 2019-09-16 MED ORDER — PANTOPRAZOLE SODIUM 40 MG PO TBEC: 40.0000 mg | DELAYED_RELEASE_TABLET | Freq: Two times a day (BID) | ORAL | 3 refills | Status: DC

## 2019-09-16 NOTE — Patient Instructions (Addendum)
No reason at this time to restart midodrine - please monitor blood pressures at home and discuss further treatment with Dr. Zigmund Daniel   Continue clopidogrel 75 mg daily and discuss restarting Pletal (cilostazol) with Dr. Trula Slade as you have not taken since discharge.  You do have follow-up with him on 09/27/2019 but recommend calling office sooner in regards to need of medication  Continue to follow with GI as scheduled  Continue to follow up with PCP regarding cholesterol and blood pressure management   Maintain strict control of hypertension with blood pressure goal below 130/90, diabetes with hemoglobin A1c goal below 6.5% and cholesterol with LDL cholesterol (bad cholesterol) goal below 70 mg/dL. I also advised the patient to eat a healthy diet with plenty of whole grains, cereals, fruits and vegetables, exercise regularly and maintain ideal body weight.  Followup in the future with me in 4 months or call earlier if needed       Thank you for coming to see [place] at [facility]. I hope we have been able to provide you high quality care today.  You may receive a patient satisfaction survey over the next few weeks. We would appreciate your feedback and comments so that we may continue to improve ourselves and the health of our patients.

## 2019-09-16 NOTE — Progress Notes (Signed)
[facility] [address] [place]. [place] [address] [phone]       HOSPITAL FOLLOW UP NOTE  [name] Date of Birth:  1940/06/10 Medical Record Number:  [id number]   Reason for Referral:  hospital stroke follow up    SUBJECTIVE:   CHIEF COMPLAINT:  Chief Complaint  Patient presents with  . Cerebral infarction    rm 9 Hospital FU  "doing well; gaining some weight back, have more energy"    HPI:   [name] is a 79 y.o. male with history of PAD, colonic polyps, hypertension, hypercholesterolemia, and anemia who presented on 08/12/2019 with weakness and confusion found to have COVID-19 along with anemia. Stroke work-up revealed incidental tiny right frontal periventricular white matter infarct most likely secondary to large vessel arthrosclerosis in the setting of severe anemia and also static hypertension.  Previously prescribed Pletal and Plavix but Plavix recently on hold due to GIB prior to admission.  Cleared by GI to return back to DAPT regimen.  Covid positive during admission despite receiving second vaccine a few weeks prior placed on steroids and remdesivir.  Advised to follow-up with vascular surgery for monitoring of asymptomatic left ICA bulb 70% stenosis.  History of HTN with orthostatic hypotension and discharged home without restart of home BP meds with normotensive goal range and avoidance of low BP.  Placed on midodrine and recommended PCP follow-up.  LDL 40 and did not recommend initiating statin due to elevated LFTs.  No history of DM.  Other stroke risk factors include advanced age, former tobacco use, history of EtOH use, AAA and PAD prescribed Pletal and Plavix PTA.  Anemia stabilized after 4 units of transfusion and recommended outpatient follow-up with GI.  Other active problems include transaminitis d/t Covid vs shock liver from anemia and recommend follow-up outpatient with PCP for repeat levels, resolution of  acute metabolic encephalopathy, hypothyroidism on Synthroid and BPH.  Evaluated by therapies and recommended discharge home with recommendation of home health therapies and 24/7 supervision.  Stroke:   Incidental tiny R frontal periventricular white matter infarct mostly likely secondary to large vessel atherosclerosis in the setting of severe anemia and also static hypotension  MRI  R frontal periventricular white matter infarct. Small vessel disease. Atrophy.   CTA head & neck no ELVO. L ICA bulb 70% stenosis. R paraclinoid ICA 55% stenosis   2D Echo EF 60-65%. No source of embolus. Dilated atria  LDL 40  HgbA1c 5.7  SCDs for VTE prophylaxis  prescribed pletal and plavix but not taking plavix d/t recent GIB prior to admission, now on clopidogrel 75 mg daily and pletal. Cleared by GI to be back on DAPT regimen.  Therapy recommendations:  HH PT, [place] OT, 24/7 supervision  Disposition:  Return home  Today, 09/16/2019, Ronnie Snyder is being seen for hospital follow-up.  He has been doing well since discharge without residual deficits or new/recurrent stroke/TIA symptoms.  He has returned back to all prior activities without difficulty stating "I feel great".  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 He has continued on Plavix with recent CBC improving and no additional black stools for signs or symptoms of bleeding.  Recent follow-up with GI with possible further work-up in the near future.  Reports he was not restarted on cilostazol at hospital discharge. He does have follow up schedule with Dr. Trula Slade scheduled on 09/27/19 for history of PAD  previously on DAPT as well as surveillance monitoring of carotid stenosis.  Blood pressure today 150/90.  Was discharged home on midodrine 3 times daily for symptomatic orthostatic hypotension as well as amlodipine 2.5 mg daily.  He reports running on midodrine a couple days ago and has  not had any orthostatic type symptoms.  He does not routinely monitor blood pressure at home.  No concerns at this time.      ROS:   14 system review of systems performed and negative with exception of weakness  PMH:  Past Medical History:  Diagnosis Date  . Abdominal aortic aneurysm (HCC)    3.5 cm 10/2016 L-spine CT (previously 3.0 cm by U/S 02/25/11)  . Anxiety   . Arthritis    "in my feet" (10/13/2012)  . GERD (gastroesophageal reflux disease)   . H/O hiatal hernia   . Hepatic steatosis   . High cholesterol    "at one time; it's fine now" (10/13/2012)  . HTN (hypertension)   . Hx of colonic polyps   . Hypothyroidism   . PAD (peripheral artery disease) ([place])   . Skin cancer    "burned them off my arm and such" (10/13/2012)  . Stroke [facility])     PSH:  Past Surgical History:  Procedure Laterality Date  . ABDOMINAL ANGIOGRAM N/A 12/04/2011   Procedure: ABDOMINAL ANGIOGRAM;  Surgeon: Sherren Mocha, MD;  Location: [facility] CATH LAB;  Service: Cardiovascular;  Laterality: N/A;  . ABDOMINAL AORTAGRAM N/A 10/13/2012   Procedure: ABDOMINAL Maxcine Ham;  Surgeon: Serafina Mitchell, MD;  Location: [facility] CATH LAB;  Service: Cardiovascular;  Laterality: N/A;  . ANGIOPLASTY / STENTING FEMORAL Left 10/13/2012  . ESOPHAGOGASTRODUODENOSCOPY    . FEMORAL ENDARTERECTOMY Left 01/23/12   Left Endarterectomy  with bovie patch Angioplasy  . gated spect wall motion stress cardiolite  02/10/2002  . HIP FRACTURE SURGERY Right 1990's  . INGUINAL HERNIA REPAIR Right    "years ago" (10/13/2012)  . LUMBAR LAMINECTOMY/DECOMPRESSION MICRODISCECTOMY N/A 05/23/2017   Procedure: L4-5 DECOMPRESSION;  Surgeon: Marybelle Killings, MD;  Location: [place];  Service: Orthopedics;  Laterality: N/A;  . NOSE SURGERY    . TONSILLECTOMY     "I was a chld" (10/13/2012)    Social History:  Social History   Socioeconomic History  . Marital status: Married    Spouse name: Not on file  . Number of children: 2  . Years of education: 51  .  Highest education level: Not on file  Occupational History  . Occupation: retired    Comment: optician  Tobacco Use  . Smoking status: Former Smoker    Packs/day: 0.25    Years: 57.00    Pack years: 14.25    Types: Cigarettes  . Smokeless tobacco: Never Used  . Tobacco comment: pt states there is no hope for quitting  Substance and Sexual Activity  . Alcohol use: Not Currently  . Drug use: No  . Sexual activity: Not on file  Other Topics Concern  . Not on file  Social History Narrative   Lives with wife, is her caregiver   Social Determinants of Health   Financial Resource Strain:   . Difficulty of Paying Living Expenses:   Food Insecurity:   . Worried About Charity fundraiser in the Last Year:   . Arboriculturist in the Last Year:   Transportation Needs:   . Film/video editor (Medical):   Marland Kitchen Lack of Transportation (Non-Medical):   Physical Activity:   .  Days of Exercise per Week:   . Minutes of Exercise per Session:   Stress:   . Feeling of Stress :   Social Connections:   . Frequency of Communication with Friends and Family:   . Frequency of Social Gatherings with Friends and Family:   . Attends Religious Services:   . Active Member of Clubs or Organizations:   . Attends Archivist Meetings:   Marland Kitchen Marital Status:   Intimate Partner Violence:   . Fear of Current or Ex-Partner:   . Emotionally Abused:   Marland Kitchen Physically Abused:   . Sexually Abused:     Family History:  Family History  Problem Relation Age of Onset  . Hypertension Mother   . Heart disease Mother   . Hypertension Father   . Heart disease Father   . Hypertension Sister   . Hypertension Sister   . Colon cancer Neg Hx   . Esophageal cancer Neg Hx   . Pancreatic cancer Neg Hx   . Stomach cancer Neg Hx     Medications:   Current Outpatient Medications on File Prior to Visit  Medication Sig Dispense Refill  . amLODipine (NORVASC) 2.5 MG tablet Take 1 tablet (2.5 mg total) by mouth  daily. 90 tablet 1  . clopidogrel (PLAVIX) 75 MG tablet Take 75 mg by mouth daily.    . diclofenac Sodium (VOLTAREN) 1 % GEL Apply 2 g topically 4 (four) times daily. 100 g 0  . lactulose (CHRONULAC) 10 GM/15ML solution Take 15 mLs (10 g total) by mouth 2 (two) times daily as needed for mild constipation. 236 mL 0  . levothyroxine (SYNTHROID) 75 MCG tablet Take 1qd (Plz sched visit with new provider) 90 tablet 0  . midodrine (PROAMATINE) 5 MG tablet Take 1 tablet (5 mg total) by mouth 3 (three) times daily with meals. 90 tablet 0  . pantoprazole (PROTONIX) 40 MG tablet Take 1 tablet (40 mg total) by mouth 2 (two) times daily. Can switch to any approved PPI. 60 tablet 3  . tamsulosin (FLOMAX) 0.4 MG CAPS capsule Take 1 capsule (0.4 mg total) by mouth daily. 30 capsule 3  . ALPRAZolam (XANAX) 0.25 MG tablet TAKE 1 TABLET(0.25 MG) BY MOUTH TWICE DAILY AS NEEDED FOR ANXIETY (Patient not taking: Reported on 09/16/2019) 30 tablet 0   No current facility-administered medications on file prior to visit.    Allergies:   Allergies  Allergen Reactions  . Atorvastatin Nausea Only  . Other Other (See Comments)    UNSPECIFIED REACTION  "ALL PAINS/NARCOTIC MEDS-CANNOT TOLERATE WELL" > per Med History prior to 05/23/17  . Penicillins Rash    Has patient had a PCN reaction causing immediate rash, facial/tongue/throat swelling, SOB or lightheadedness with hypotension: Unknown Has patient had a PCN reaction causing severe rash involving mucus membranes or skin necrosis: No Has patient had a PCN reaction that required hospitalization: No Has patient had a PCN reaction occurring within the last 10 years: No If all of the above answers are "NO", then may proceed with Cephalosporin use.   . Pioglitazone Nausea Only  . Vicodin [Hydrocodone-Acetaminophen] Nausea Only      OBJECTIVE:  Vitals  Vitals:   09/16/19 1046  BP: (!) 150/90  Pulse: 84  Temp: 97.8 F (36.6 C)  Weight: 139 lb 9.6 oz (63.3 kg)    Height: 5\' 8"  (1.727 m)   Body mass index is 21.23 kg/m. No exam data present  Post stroke PHQ 2 9  Depression screen  PHQ 2/9 05/29/2018  Decreased Interest 0  Down, Depressed, Hopeless 1  PHQ - 2 Score 1  Altered sleeping 1  Tired, decreased energy 2  Change in appetite 0  Feeling bad or failure about yourself  0  Trouble concentrating 0  Moving slowly or fidgety/restless 0  PHQ-9 Score 4  Some recent data might be hidden     Physical exam  General: well developed, well nourished, very pleasant elderly Caucasian male, seated, in no evident distress Head: head normocephalic and atraumatic.   Neck: supple with no carotid or supraclavicular bruits Cardiovascular: regular rate and rhythm, no murmurs Musculoskeletal: no deformity Skin:  no rash/petichiae Vascular:  Normal pulses all extremities   Neurologic Exam Mental Status: Awake and fully alert.   Fluent speech and language.  Oriented to place and time. Recent and remote memory intact. Attention span, concentration and fund of knowledge appropriate. Mood and affect appropriate.  Cranial Nerves: Fundoscopic exam reveals sharp disc margins. Pupils equal, briskly reactive to light. Extraocular movements full without nystagmus. Visual fields full to confrontation. Hearing intact. Facial sensation intact. Face, tongue, palate moves normally and symmetrically.  Motor: Normal bulk and tone. Normal strength in all tested extremity muscles. Sensory.: intact to touch , pinprick , position and vibratory sensation.  Coordination: Rapid alternating movements normal in all extremities. Finger-to-nose and heel-to-shin performed accurately bilaterally. Gait and Station: Arises from chair without difficulty. Stance is normal. Gait demonstrates normal stride length and balance without use of assistive device. Reflexes: 1+ and symmetric. Toes downgoing.     NIHSS  0 Modified Rankin  0     ASSESSMENT: Ronnie Snyder is a 79 y.o.  year old male presented with weakness and confusion found to have COVID-19 along with anemia on 08/12/2019 with stroke work-up revealing incidental tiny right frontal periventricular white matter infarct most likely secondary to large vessel arthrosclerosis in setting of severe anemia and also static hypotension. Vascular risk factors include PAD, hx HTN with orthostatic hypotension, HLD, COVID-19 infection, carotid stenosis, anemia with recent GIB and Plavix previously on hold, and AAA.  Recovered well from a stroke standpoint without residual deficits or new/reoccurring stroke/TIA symptoms.     PLAN:  1. Right frontal white matter stroke:  -Continue clopidogrel 75 mg daily for secondary stroke prevention.  -Advised to reach out to vascular surgery Dr. Trula Slade in regards to restarting cilostazol for secondary stroke prevention and history of PAD s/p stent -Maintain strict control of hypertension with blood pressure goal between 130-150 and avoidance of hypotension, diabetes with hemoglobin A1c goal below 6.5% and cholesterol with LDL cholesterol (bad cholesterol) goal below 70 mg/dL.  I also advised the patient to eat a healthy diet with plenty of whole grains, cereals, fruits and vegetables, exercise regularly with at least 30 minutes of continuous activity daily and maintain ideal body weight. 2. Carotid stenosis and PAD: Left ICA 70% stenosis and right paraclinoid ICA 55% stenosis.  Follow-up visit with Dr. Trula Slade scheduled on 09/27/2019.  Discussion regarding recommended DAPT but apparently discontinued at discharge -advised to discuss further with vascular surgery and GI regarding restarting cilostazol in addition to Plavix 3. Orthostatic hypotension: Currently asymptomatic with blood pressure at goal without ongoing use of midodrine.  No indication at this time to restart but did advise patient to start monitoring blood pressure levels at home and follow-up with PCP for further monitoring  management 4. HTN: Goal normotensive range and avoidance of hypotension 5. HLD: No statin use at this time as  satisfactory LDL and elevated liver enzymes during recent admission -request repeat LFTs or CMP through PCP in the near future and initiate statin once LFTs satisfactory for secondary stroke prevention and known carotid stenosis   Follow up in 4 months or call earlier if needed   I spent 50 minutes of face-to-face and non-face-to-face time with patient.  This included previsit chart review, lab review, study review, order entry, electronic health record documentation, patient education regarding recent stroke,  importance of managing stroke risk factors, review of medications and answered multiple questions pertaining to hospital admission and discharge recommendations and answered all additional questions to patient satisfaction     Frann Rider, [facility]  [facility] [address] [place], [place] [address]  Phone [phone] Fax [phone] Note: This document was prepared with digital dictation and possible smart phrase technology. Any transcriptional errors that result from this process are unintentional.

## 2019-09-20 NOTE — Progress Notes (Signed)
I agree with the above plan 

## 2019-09-23 ENCOUNTER — Other Ambulatory Visit: Payer: Self-pay | Admitting: *Deleted

## 2019-09-23 DIAGNOSIS — I6529 Occlusion and stenosis of unspecified carotid artery: Secondary | ICD-10-CM

## 2019-09-24 ENCOUNTER — Telehealth (HOSPITAL_COMMUNITY): Payer: Self-pay

## 2019-09-24 NOTE — Telephone Encounter (Signed)

## 2019-09-27 ENCOUNTER — Ambulatory Visit (HOSPITAL_COMMUNITY)
Admission: RE | Admit: 2019-09-27 | Discharge: 2019-09-27 | Disposition: A | Discharge status: Home / Self Care | Payer: Medicare Other | Source: Ambulatory Visit | Attending: Surgery | Admitting: Surgery

## 2019-09-27 ENCOUNTER — Ambulatory Visit (INDEPENDENT_AMBULATORY_CARE_PROVIDER_SITE_OTHER): Payer: Medicare Other | Admitting: Surgery

## 2019-09-27 ENCOUNTER — Encounter: Payer: Self-pay | Admitting: Surgery

## 2019-09-27 ENCOUNTER — Other Ambulatory Visit: Payer: Self-pay

## 2019-09-27 VITALS — BP 183/84 | HR 79 | Temp 97.7°F | Resp 20 | Ht 68.0 in | Wt 138.0 lb

## 2019-09-27 DIAGNOSIS — I6522 Occlusion and stenosis of left carotid artery: Secondary | ICD-10-CM | POA: Diagnosis not present

## 2019-09-27 DIAGNOSIS — I70213 Atherosclerosis of native arteries of extremities with intermittent claudication, bilateral legs: Secondary | ICD-10-CM

## 2019-09-27 DIAGNOSIS — I6529 Occlusion and stenosis of unspecified carotid artery: Secondary | ICD-10-CM | POA: Diagnosis not present

## 2019-09-27 DIAGNOSIS — I6330 Cerebral infarction due to thrombosis of unspecified cerebral artery: Secondary | ICD-10-CM

## 2019-09-27 NOTE — Progress Notes (Signed)
Vascular and Vein Specialist of [facility]  Patient name: Ronnie Snyder MRN: [id number] DOB: 1940/12/12 Sex: male   REQUESTING PROVIDER:    ER   REASON FOR CONSULT:    Carotid stenosis  HISTORY OF PRESENT ILLNESS:   Ronnie Snyder is a 79 y.o. male, who is back today for evaluation of a left carotid stenosis.  The patient has a history of a left iliofemoral endarterectomy with patch angioplasty by Dr. Scot Dock on 01/23/2012.  Subsequently I performed left femoral-popliteal atherectomy with stenting on 10/13/2012.  The patient was lost to follow-up until I saw him earlier this year.  He was complaining of leg cramping after about 1-200 yards of walking.  Both legs affected him equally.  He had a palpable posterior tibial pulse on the left and a normal ankle-brachial index.  There was no palpable pulse on the right and his ABI was 0.59.  We contemplated angiography but he was not eager to do this as he is caring for his wife.  I ended up starting him on Pletal.  He is unsure if he has been taking this.  He was admitted to the hospital on 08/12/2019 with weakness and confusion.  He was found to be Covid positive despite having had both shots of the vaccine.  He had a stroke work-up which revealed a incidental tiny right frontal periventricular white matter infarct.  It was also noted that he had a 70% left carotid stenosis.  The patient also had issues regarding GI bleeding.  He has been cleared to go back on dual antiplatelet therapy.  The patient has been taking care of his wife and so he has not been available to come to his visits.  He continues to be a light smoker.  He is medically managed for hypertension.  He has a statin intolerance.   PAST MEDICAL HISTORY    Past Medical History:  Diagnosis Date  . Abdominal aortic aneurysm (HCC)    3.5 cm 10/2016 L-spine CT (previously 3.0 cm by U/S 02/25/11)  . Anxiety   . Arthritis    "in my  feet" (10/13/2012)  . GERD (gastroesophageal reflux disease)   . H/O hiatal hernia   . Hepatic steatosis   . High cholesterol    "at one time; it's fine now" (10/13/2012)  . HTN (hypertension)   . Hx of colonic polyps   . Hypothyroidism   . PAD (peripheral artery disease) ([place])   . Skin cancer    "burned them off my arm and such" (10/13/2012)  . Stroke [facility])      FAMILY HISTORY   Family History  Problem Relation Age of Onset  . Hypertension Mother   . Heart disease Mother   . Hypertension Father   . Heart disease Father   . Hypertension Sister   . Hypertension Sister   . Colon cancer Neg Hx   . Esophageal cancer Neg Hx   . Pancreatic cancer Neg Hx   . Stomach cancer Neg Hx     SOCIAL HISTORY:   Social History   Socioeconomic History  . Marital status: Married    Spouse name: Not on file  . Number of children: 2  . Years of education: 74  . Highest education level: Not on file  Occupational History  . Occupation: retired    Comment: optician  Tobacco Use  . Smoking status: Former Smoker    Packs/day: 0.25    Years: 57.00    Pack years: 14.25  Types: Cigarettes  . Smokeless tobacco: Never Used  . Tobacco comment: pt states there is no hope for quitting  Substance and Sexual Activity  . Alcohol use: Not Currently  . Drug use: No  . Sexual activity: Not on file  Other Topics Concern  . Not on file  Social History Narrative   Lives with wife, is her caregiver   Social Determinants of Health   Financial Resource Strain:   . Difficulty of Paying Living Expenses:   Food Insecurity:   . Worried About Charity fundraiser in the Last Year:   . Arboriculturist in the Last Year:   Transportation Needs:   . Film/video editor (Medical):   Marland Kitchen Lack of Transportation (Non-Medical):   Physical Activity:   . Days of Exercise per Week:   . Minutes of Exercise per Session:   Stress:   . Feeling of Stress :   Social Connections:   . Frequency of Communication  with Friends and Family:   . Frequency of Social Gatherings with Friends and Family:   . Attends Religious Services:   . Active Member of Clubs or Organizations:   . Attends Archivist Meetings:   Marland Kitchen Marital Status:   Intimate Partner Violence:   . Fear of Current or Ex-Partner:   . Emotionally Abused:   Marland Kitchen Physically Abused:   . Sexually Abused:     ALLERGIES:    Allergies  Allergen Reactions  . Atorvastatin Nausea Only  . Other Other (See Comments)    UNSPECIFIED REACTION  "ALL PAINS/NARCOTIC MEDS-CANNOT TOLERATE WELL" > per Med History prior to 05/23/17  . Penicillins Rash    Has patient had a PCN reaction causing immediate rash, facial/tongue/throat swelling, SOB or lightheadedness with hypotension: Unknown Has patient had a PCN reaction causing severe rash involving mucus membranes or skin necrosis: No Has patient had a PCN reaction that required hospitalization: No Has patient had a PCN reaction occurring within the last 10 years: No If all of the above answers are "NO", then may proceed with Cephalosporin use.   . Pioglitazone Nausea Only  . Vicodin [Hydrocodone-Acetaminophen] Nausea Only    CURRENT MEDICATIONS:    Current Outpatient Medications  Medication Sig Dispense Refill  . ALPRAZolam (XANAX) 0.25 MG tablet TAKE 1 TABLET(0.25 MG) BY MOUTH TWICE DAILY AS NEEDED FOR ANXIETY 30 tablet 0  . amLODipine (NORVASC) 2.5 MG tablet Take 1 tablet (2.5 mg total) by mouth daily. 90 tablet 1  . clopidogrel (PLAVIX) 75 MG tablet Take 75 mg by mouth daily.    . diclofenac Sodium (VOLTAREN) 1 % GEL Apply 2 g topically 4 (four) times daily. 100 g 0  . levothyroxine (SYNTHROID) 75 MCG tablet Take 1qd (Plz sched visit with new provider) 90 tablet 0  . losartan (COZAAR) 100 MG tablet     . pantoprazole (PROTONIX) 40 MG tablet Take 1 tablet (40 mg total) by mouth 2 (two) times daily. Can switch to any approved PPI. 60 tablet 3  . tamsulosin (FLOMAX) 0.4 MG CAPS capsule Take 1  capsule (0.4 mg total) by mouth daily. 30 capsule 3  . lactulose (CHRONULAC) 10 GM/15ML solution Take 15 mLs (10 g total) by mouth 2 (two) times daily as needed for mild constipation. (Patient not taking: Reported on 09/27/2019) 236 mL 0   No current facility-administered medications for this visit.    REVIEW OF SYSTEMS:   [X]  denotes positive finding, [ ]  denotes negative finding Cardiac  Comments:  Chest pain or chest pressure:    Shortness of breath upon exertion:    Short of breath when lying flat:    Irregular heart rhythm:        Vascular    Pain in calf, thigh, or hip brought on by ambulation:    Pain in feet at night that wakes you up from your sleep:     Blood clot in your veins:    Leg swelling:         Pulmonary    Oxygen at home:    Productive cough:     Wheezing:         Neurologic    Sudden weakness in arms or legs:     Sudden numbness in arms or legs:     Sudden onset of difficulty speaking or slurred speech:    Temporary loss of vision in one eye:     Problems with dizziness:         Gastrointestinal    Blood in stool:      Vomited blood:         Genitourinary    Burning when urinating:     Blood in urine:        Psychiatric    Major depression:         Hematologic    Bleeding problems:    Problems with blood clotting too easily:        Skin    Rashes or ulcers:        Constitutional    Fever or chills:     PHYSICAL EXAM:   Vitals:   09/27/19 1126 09/27/19 1129  BP: (!) 182/86 (!) 183/84  Pulse: 79   Resp: 20   Temp: 97.7 F (36.5 C)   SpO2: 93%   Weight: 138 lb (62.6 kg)   Height: 5\' 8"  (1.727 m)     GENERAL: The patient is a well-nourished male, in no acute distress. The vital signs are documented above. CARDIAC: There is a regular rate and rhythm.  I PULMONARY: Nonlabored respirations MUSCULOSKELETAL: There are no major deformities or cyanosis. NEUROLOGIC: No focal weakness or paresthesias are detected. SKIN: There are no  ulcers or rashes noted. PSYCHIATRIC: The patient has a normal affect.  STUDIES:   I have reviewed the following: CTA NECK 1. No emergent finding. 2. Advanced cervical carotid atherosclerosis with up to 70% stenosis at the left bulb. 3. 55% stenosis at the right paraclinoid ICA.  CAROTID U/S Right Carotid: Velocities in the right ICA are consistent with a 1-39%  stenosis.         Non-hemodynamically significant plaque <50% noted in the  CCA. The         ECA appears >50% stenosed.   Left Carotid: Velocities in the left ICA are consistent with a 40-59%  stenosis.        Non-hemodynamically significant plaque <50% noted in the  CCA. The        ECA appears >50% stenosed.   Vertebrals: Bilateral vertebral arteries demonstrate antegrade flow.  Subclavians: Normal flow hemodynamics were seen in bilateral subclavian        arteries.  ASSESSMENT and PLAN   Carotid stenosis: The patient is asymptomatic and less than 80%.  Therefore I told him we would treat him medically until the stenosis increased to greater than 80% at which time we would consider intervention.  He appears to be taking Plavix which I think is satisfactory.  The only  other preventative area that needs to be addressed is statin therapy which he is intolerant to.  He would benefit from referral to the hyperlipidemia clinic.  He is supposed to see me back in a few weeks for a abdominal ultrasound to evaluate possible aortic aneurysm.  I had placed him on Pletal at his last visit.  He is unsure whether or not he is taking this.  I have encouraged him to restart this if possible.   Leia Alf, MD, FACS Vascular and Vein Specialists of [facility] [phone] Pager [phone]

## 2019-09-28 ENCOUNTER — Other Ambulatory Visit: Payer: Self-pay | Admitting: *Deleted

## 2019-09-28 DIAGNOSIS — I6529 Occlusion and stenosis of unspecified carotid artery: Secondary | ICD-10-CM

## 2019-10-05 ENCOUNTER — Other Ambulatory Visit: Payer: Self-pay | Admitting: *Deleted

## 2019-10-05 DIAGNOSIS — I714 Abdominal aortic aneurysm, without rupture, unspecified: Secondary | ICD-10-CM

## 2019-10-08 ENCOUNTER — Telehealth (HOSPITAL_COMMUNITY): Payer: Self-pay

## 2019-10-08 NOTE — Telephone Encounter (Signed)

## 2019-10-11 ENCOUNTER — Ambulatory Visit: Payer: Medicare Other | Admitting: Surgery

## 2019-10-11 ENCOUNTER — Other Ambulatory Visit: Payer: Self-pay

## 2019-10-11 ENCOUNTER — Encounter: Payer: Self-pay | Admitting: Surgery

## 2019-10-11 ENCOUNTER — Ambulatory Visit (HOSPITAL_COMMUNITY)
Admission: RE | Admit: 2019-10-11 | Discharge: 2019-10-11 | Disposition: A | Discharge status: Home / Self Care | Payer: Medicare Other | Source: Ambulatory Visit | Attending: Surgery | Admitting: Surgery

## 2019-10-11 ENCOUNTER — Ambulatory Visit (INDEPENDENT_AMBULATORY_CARE_PROVIDER_SITE_OTHER): Payer: Medicare Other | Admitting: Surgery

## 2019-10-11 ENCOUNTER — Other Ambulatory Visit (HOSPITAL_COMMUNITY): Payer: Medicare Other

## 2019-10-11 VITALS — BP 175/75 | HR 77 | Temp 97.8°F | Resp 20 | Ht 68.0 in | Wt 140.0 lb

## 2019-10-11 DIAGNOSIS — I6330 Cerebral infarction due to thrombosis of unspecified cerebral artery: Secondary | ICD-10-CM | POA: Diagnosis not present

## 2019-10-11 DIAGNOSIS — I714 Abdominal aortic aneurysm, without rupture, unspecified: Secondary | ICD-10-CM

## 2019-10-11 DIAGNOSIS — I70213 Atherosclerosis of native arteries of extremities with intermittent claudication, bilateral legs: Secondary | ICD-10-CM

## 2019-10-11 DIAGNOSIS — I6522 Occlusion and stenosis of left carotid artery: Secondary | ICD-10-CM | POA: Diagnosis not present

## 2019-10-11 NOTE — Progress Notes (Signed)
Vascular and Vein Specialist of [facility]  Patient name: Ronnie Snyder MRN: [id number] DOB: 11-Feb-1941 Sex: male   REASON FOR VISIT:    Follow up  HISOTRY OF PRESENT ILLNESS:   Ronnie Snyder is a 79 y.o. male, who is back today for evaluation of a left carotid stenosis.  The patient has a history of a left iliofemoral endarterectomy with patch angioplasty by Dr. Scot Dock on 01/23/2012.  Subsequently I performed left femoral-popliteal atherectomy with stenting on 10/13/2012.  The patient was lost to follow-up until I saw him earlier this year.  He was complaining of leg cramping after about 1-200 yards of walking.  Both legs affected him equally.  He had a palpable posterior tibial pulse on the left and a normal ankle-brachial index.  There was no palpable pulse on the right and his ABI was 0.59.  We contemplated angiography but he was not eager to do this as he is caring for his wife.  I ended up starting him on Pletal.  He is unsure if he has been taking this.  He was admitted to the hospital on 08/12/2019 with weakness and confusion.  He was found to be Covid positive despite having had both shots of the vaccine.  He had a stroke work-up which revealed a incidental tiny right frontal periventricular white matter infarct.  It was also noted that he had a 70% left carotid stenosis.  The patient also had issues regarding GI bleeding.  He has been cleared to go back on dual antiplatelet therapy.  The patient has been taking care of his wife and so he has not been available to come to his visits. He continues to be a light smoker. He is medically managed for hypertension. He has a statin intolerance.    PAST MEDICAL HISTORY:   Past Medical History:  Diagnosis Date  . Abdominal aortic aneurysm (HCC)    3.5 cm 10/2016 L-spine CT (previously 3.0 cm by U/S 02/25/11)  . Anxiety   . Arthritis    "in my feet" (10/13/2012)  . GERD  (gastroesophageal reflux disease)   . H/O hiatal hernia   . Hepatic steatosis   . High cholesterol    "at one time; it's fine now" (10/13/2012)  . HTN (hypertension)   . Hx of colonic polyps   . Hypothyroidism   . PAD (peripheral artery disease) ([place])   . Skin cancer    "burned them off my arm and such" (10/13/2012)  . Stroke [facility])      FAMILY HISTORY:   Family History  Problem Relation Age of Onset  . Hypertension Mother   . Heart disease Mother   . Hypertension Father   . Heart disease Father   . Hypertension Sister   . Hypertension Sister   . Colon cancer Neg Hx   . Esophageal cancer Neg Hx   . Pancreatic cancer Neg Hx   . Stomach cancer Neg Hx     SOCIAL HISTORY:   Social History   Tobacco Use  . Smoking status: Former Smoker    Packs/day: 0.25    Years: 57.00    Pack years: 14.25    Types: Cigarettes  . Smokeless tobacco: Never Used  . Tobacco comment: pt states there is no hope for quitting  Substance Use Topics  . Alcohol use: Not Currently     ALLERGIES:   Allergies  Allergen Reactions  . Atorvastatin Nausea Only  . Other Other (See Comments)    UNSPECIFIED  REACTION  "ALL PAINS/NARCOTIC MEDS-CANNOT TOLERATE WELL" > per Med History prior to 05/23/17  . Penicillins Rash    Has patient had a PCN reaction causing immediate rash, facial/tongue/throat swelling, SOB or lightheadedness with hypotension: Unknown Has patient had a PCN reaction causing severe rash involving mucus membranes or skin necrosis: No Has patient had a PCN reaction that required hospitalization: No Has patient had a PCN reaction occurring within the last 10 years: No If all of the above answers are "NO", then may proceed with Cephalosporin use.   . Pioglitazone Nausea Only  . Vicodin [Hydrocodone-Acetaminophen] Nausea Only     CURRENT MEDICATIONS:   Current Outpatient Medications  Medication Sig Dispense Refill  . ALPRAZolam (XANAX) 0.25 MG tablet TAKE 1 TABLET(0.25 MG) BY  MOUTH TWICE DAILY AS NEEDED FOR ANXIETY 30 tablet 0  . amLODipine (NORVASC) 2.5 MG tablet Take 1 tablet (2.5 mg total) by mouth daily. 90 tablet 1  . clopidogrel (PLAVIX) 75 MG tablet Take 75 mg by mouth daily.    . diclofenac Sodium (VOLTAREN) 1 % GEL Apply 2 g topically 4 (four) times daily. 100 g 0  . levothyroxine (SYNTHROID) 75 MCG tablet Take 1qd (Plz sched visit with new provider) 90 tablet 0  . losartan (COZAAR) 100 MG tablet     . pantoprazole (PROTONIX) 40 MG tablet Take 1 tablet (40 mg total) by mouth 2 (two) times daily. Can switch to any approved PPI. 60 tablet 3  . tamsulosin (FLOMAX) 0.4 MG CAPS capsule Take 1 capsule (0.4 mg total) by mouth daily. 30 capsule 3  . lactulose (CHRONULAC) 10 GM/15ML solution Take 15 mLs (10 g total) by mouth 2 (two) times daily as needed for mild constipation. (Patient not taking: Reported on 09/27/2019) 236 mL 0   No current facility-administered medications for this visit.    REVIEW OF SYSTEMS:   [X]  denotes positive finding, [ ]  denotes negative finding Cardiac  Comments:  Chest pain or chest pressure:    Shortness of breath upon exertion:    Short of breath when lying flat:    Irregular heart rhythm:        Vascular    Pain in calf, thigh, or hip brought on by ambulation: x   Pain in feet at night that wakes you up from your sleep:     Blood clot in your veins:    Leg swelling:         Pulmonary    Oxygen at home:    Productive cough:     Wheezing:         Neurologic    Sudden weakness in arms or legs:     Sudden numbness in arms or legs:     Sudden onset of difficulty speaking or slurred speech:    Temporary loss of vision in one eye:     Problems with dizziness:         Gastrointestinal    Blood in stool:     Vomited blood:         Genitourinary    Burning when urinating:     Blood in urine:        Psychiatric    Major depression:         Hematologic    Bleeding problems:    Problems with blood clotting too  easily:        Skin    Rashes or ulcers:        Constitutional    Fever  or chills:      PHYSICAL EXAM:   Vitals:   10/11/19 0956  BP: (!) 175/75  Pulse: 77  Resp: 20  Temp: 97.8 F (36.6 C)  SpO2: 94%  Weight: 140 lb (63.5 kg)  Height: 5\' 8"  (1.727 m)    GENERAL: The patient is a well-nourished male, in no acute distress. The vital signs are documented above. CARDIAC: There is a regular rate and rhythm.  PULMONARY: Non-labored respirations ABDOMEN: Soft and non-tender  MUSCULOSKELETAL: There are no major deformities or cyanosis. NEUROLOGIC: No focal weakness or paresthesias are detected. SKIN: There are no ulcers or rashes noted. PSYCHIATRIC: The patient has a normal affect.  STUDIES:   I have reviewed the following AAA duplex:  Abdominal Aorta: Previous diameter measurement was 3.7 x 3.6 cm. Todays  largest diameter is 3.95 cms  MEDICAL ISSUES:   AAA: Maximum aortic diameter is 3.  9 cm.  He will follow up with repeat imaging in 1 year  Carotid duplex: 40 to 59% left carotid stenosis, he will follow-up in 1 year  PVD: The patient continues to have claudication in both legs right greater than left.  He is going to start taking his Pletal.  He will contact me if this gets worse, otherwise we will reassess him in 1 year.    Leia Alf, MD, FACS Vascular and Vein Specialists of [facility] [phone] Pager [phone]

## 2019-10-19 DIAGNOSIS — D126 Benign neoplasm of colon, unspecified: Secondary | ICD-10-CM

## 2019-10-19 HISTORY — DX: Benign neoplasm of colon, unspecified: D12.6

## 2019-10-27 ENCOUNTER — Other Ambulatory Visit: Payer: Self-pay | Admitting: Family Medicine

## 2019-10-27 NOTE — Telephone Encounter (Signed)
CM-Plz see refill req/thx dmf 

## 2019-11-09 ENCOUNTER — Encounter (HOSPITAL_COMMUNITY): Payer: Self-pay

## 2019-11-09 ENCOUNTER — Inpatient Hospital Stay (HOSPITAL_COMMUNITY)
Admission: EM | Admit: 2019-11-09 | Discharge: 2019-11-12 | DRG: 378 | Disposition: A | Discharge status: Home / Self Care | Payer: Medicare Other | Attending: Infectious Disease | Admitting: Infectious Disease

## 2019-11-09 ENCOUNTER — Other Ambulatory Visit (INDEPENDENT_AMBULATORY_CARE_PROVIDER_SITE_OTHER): Payer: Medicare Other

## 2019-11-09 ENCOUNTER — Ambulatory Visit (INDEPENDENT_AMBULATORY_CARE_PROVIDER_SITE_OTHER): Payer: Medicare Other | Admitting: Gastroenterology

## 2019-11-09 ENCOUNTER — Encounter: Payer: Self-pay | Admitting: Gastroenterology

## 2019-11-09 ENCOUNTER — Other Ambulatory Visit: Payer: Self-pay

## 2019-11-09 VITALS — BP 130/72 | HR 76 | Ht 67.0 in | Wt 141.0 lb

## 2019-11-09 DIAGNOSIS — K2100 Gastro-esophageal reflux disease with esophagitis, without bleeding: Secondary | ICD-10-CM | POA: Diagnosis present

## 2019-11-09 DIAGNOSIS — E7800 Pure hypercholesterolemia, unspecified: Secondary | ICD-10-CM | POA: Diagnosis present

## 2019-11-09 DIAGNOSIS — Z85828 Personal history of other malignant neoplasm of skin: Secondary | ICD-10-CM

## 2019-11-09 DIAGNOSIS — K921 Melena: Secondary | ICD-10-CM

## 2019-11-09 DIAGNOSIS — D500 Iron deficiency anemia secondary to blood loss (chronic): Secondary | ICD-10-CM | POA: Diagnosis present

## 2019-11-09 DIAGNOSIS — K209 Esophagitis, unspecified without bleeding: Secondary | ICD-10-CM | POA: Diagnosis not present

## 2019-11-09 DIAGNOSIS — I10 Essential (primary) hypertension: Secondary | ICD-10-CM | POA: Diagnosis present

## 2019-11-09 DIAGNOSIS — Z8673 Personal history of transient ischemic attack (TIA), and cerebral infarction without residual deficits: Secondary | ICD-10-CM | POA: Diagnosis not present

## 2019-11-09 DIAGNOSIS — K31811 Angiodysplasia of stomach and duodenum with bleeding: Secondary | ICD-10-CM | POA: Diagnosis present

## 2019-11-09 DIAGNOSIS — N400 Enlarged prostate without lower urinary tract symptoms: Secondary | ICD-10-CM | POA: Diagnosis present

## 2019-11-09 DIAGNOSIS — D123 Benign neoplasm of transverse colon: Secondary | ICD-10-CM | POA: Diagnosis present

## 2019-11-09 DIAGNOSIS — E871 Hypo-osmolality and hyponatremia: Secondary | ICD-10-CM | POA: Diagnosis present

## 2019-11-09 DIAGNOSIS — E039 Hypothyroidism, unspecified: Secondary | ICD-10-CM | POA: Diagnosis present

## 2019-11-09 DIAGNOSIS — I714 Abdominal aortic aneurysm, without rupture: Secondary | ICD-10-CM | POA: Diagnosis present

## 2019-11-09 DIAGNOSIS — R195 Other fecal abnormalities: Secondary | ICD-10-CM | POA: Diagnosis not present

## 2019-11-09 DIAGNOSIS — D509 Iron deficiency anemia, unspecified: Secondary | ICD-10-CM

## 2019-11-09 DIAGNOSIS — K760 Fatty (change of) liver, not elsewhere classified: Secondary | ICD-10-CM | POA: Diagnosis present

## 2019-11-09 DIAGNOSIS — I6330 Cerebral infarction due to thrombosis of unspecified cerebral artery: Secondary | ICD-10-CM | POA: Diagnosis not present

## 2019-11-09 DIAGNOSIS — K648 Other hemorrhoids: Secondary | ICD-10-CM | POA: Diagnosis present

## 2019-11-09 DIAGNOSIS — K621 Rectal polyp: Secondary | ICD-10-CM | POA: Diagnosis present

## 2019-11-09 DIAGNOSIS — Z8616 Personal history of COVID-19: Secondary | ICD-10-CM | POA: Diagnosis not present

## 2019-11-09 DIAGNOSIS — D122 Benign neoplasm of ascending colon: Secondary | ICD-10-CM | POA: Diagnosis present

## 2019-11-09 DIAGNOSIS — K5730 Diverticulosis of large intestine without perforation or abscess without bleeding: Secondary | ICD-10-CM | POA: Diagnosis present

## 2019-11-09 DIAGNOSIS — I739 Peripheral vascular disease, unspecified: Secondary | ICD-10-CM | POA: Diagnosis present

## 2019-11-09 DIAGNOSIS — Z79890 Hormone replacement therapy (postmenopausal): Secondary | ICD-10-CM | POA: Diagnosis not present

## 2019-11-09 DIAGNOSIS — K3189 Other diseases of stomach and duodenum: Secondary | ICD-10-CM | POA: Diagnosis not present

## 2019-11-09 DIAGNOSIS — Z7902 Long term (current) use of antithrombotics/antiplatelets: Secondary | ICD-10-CM

## 2019-11-09 DIAGNOSIS — D649 Anemia, unspecified: Secondary | ICD-10-CM | POA: Diagnosis not present

## 2019-11-09 DIAGNOSIS — Z87891 Personal history of nicotine dependence: Secondary | ICD-10-CM | POA: Diagnosis not present

## 2019-11-09 DIAGNOSIS — Z79899 Other long term (current) drug therapy: Secondary | ICD-10-CM

## 2019-11-09 DIAGNOSIS — K31819 Angiodysplasia of stomach and duodenum without bleeding: Secondary | ICD-10-CM | POA: Diagnosis not present

## 2019-11-09 DIAGNOSIS — K922 Gastrointestinal hemorrhage, unspecified: Secondary | ICD-10-CM | POA: Diagnosis present

## 2019-11-09 DIAGNOSIS — D126 Benign neoplasm of colon, unspecified: Secondary | ICD-10-CM

## 2019-11-09 DIAGNOSIS — D128 Benign neoplasm of rectum: Secondary | ICD-10-CM | POA: Diagnosis not present

## 2019-11-09 DIAGNOSIS — K635 Polyp of colon: Secondary | ICD-10-CM | POA: Diagnosis not present

## 2019-11-09 LAB — CBC WITH DIFFERENTIAL/PLATELET
Abs Immature Granulocytes: 0.01 10*3/uL (ref 0.00–0.07)
Basophils Absolute: 0.1 10*3/uL (ref 0.0–0.1)
Basophils Absolute: 0 10*3/uL (ref 0.0–0.1)
Basophils Relative: 1.4 % (ref 0.0–3.0)
Basophils Relative: 1 %
Eosinophils Absolute: 0.1 10*3/uL (ref 0.0–0.7)
Eosinophils Absolute: 0.1 10*3/uL (ref 0.0–0.5)
Eosinophils Relative: 2.6 % (ref 0.0–5.0)
Eosinophils Relative: 3 %
HCT: 19.8 % — CL (ref 39.0–52.0)
HCT: 21 % — ABNORMAL LOW (ref 39.0–52.0)
Hemoglobin: 6 g/dL — CL (ref 13.0–17.0)
Hemoglobin: 5.7 g/dL — CL (ref 13.0–17.0)
Immature Granulocytes: 0 %
Lymphocytes Relative: 29.6 % (ref 12.0–46.0)
Lymphocytes Relative: 26 %
Lymphs Abs: 1.1 10*3/uL (ref 0.7–4.0)
Lymphs Abs: 1.1 10*3/uL (ref 0.7–4.0)
MCH: 21 pg — ABNORMAL LOW (ref 26.0–34.0)
MCHC: 30.4 g/dL (ref 30.0–36.0)
MCHC: 27.1 g/dL — ABNORMAL LOW (ref 30.0–36.0)
MCV: 71.9 fl — ABNORMAL LOW (ref 78.0–100.0)
MCV: 77.2 fL — ABNORMAL LOW (ref 80.0–100.0)
Monocytes Absolute: 0.6 10*3/uL (ref 0.1–1.0)
Monocytes Absolute: 0.6 10*3/uL (ref 0.1–1.0)
Monocytes Relative: 14.3 % — ABNORMAL HIGH (ref 3.0–12.0)
Monocytes Relative: 14 %
Neutro Abs: 2 10*3/uL (ref 1.4–7.7)
Neutro Abs: 2.2 10*3/uL (ref 1.7–7.7)
Neutrophils Relative %: 52.1 % (ref 43.0–77.0)
Neutrophils Relative %: 56 %
Platelets: 272 10*3/uL (ref 150.0–400.0)
Platelets: 272 10*3/uL (ref 150–400)
RBC: 2.76 Mil/uL — ABNORMAL LOW (ref 4.22–5.81)
RBC: 2.72 MIL/uL — ABNORMAL LOW (ref 4.22–5.81)
RDW: 22.3 % — ABNORMAL HIGH (ref 11.5–15.5)
RDW: 19.6 % — ABNORMAL HIGH (ref 11.5–15.5)
WBC: 3.9 10*3/uL — ABNORMAL LOW (ref 4.0–10.5)
WBC: 4 10*3/uL (ref 4.0–10.5)
nRBC: 0 % (ref 0.0–0.2)

## 2019-11-09 LAB — SARS CORONAVIRUS 2 BY RT PCR (HOSPITAL ORDER, PERFORMED IN ~~LOC~~ HOSPITAL LAB): SARS Coronavirus 2: NEGATIVE

## 2019-11-09 LAB — BASIC METABOLIC PANEL
Anion gap: 10 (ref 5–15)
BUN: 12 mg/dL (ref 8–23)
CO2: 23 mmol/L (ref 22–32)
Calcium: 8.6 mg/dL — ABNORMAL LOW (ref 8.9–10.3)
Chloride: 96 mmol/L — ABNORMAL LOW (ref 98–111)
Creatinine, Ser: 0.53 mg/dL — ABNORMAL LOW (ref 0.61–1.24)
GFR calc Af Amer: >60 mL/min (ref >60)
GFR calc non Af Amer: >60 mL/min (ref >60)
Glucose, Bld: 130 mg/dL — ABNORMAL HIGH (ref 70–99)
Potassium: 4.1 mmol/L (ref 3.5–5.1)
Sodium: 129 mmol/L — ABNORMAL LOW (ref 135–145)

## 2019-11-09 LAB — POC OCCULT BLOOD, ED: Fecal Occult Bld: POSITIVE — AB

## 2019-11-09 LAB — PREPARE RBC (CROSSMATCH)

## 2019-11-09 MED ORDER — PANTOPRAZOLE SODIUM 40 MG IV SOLR
40.0000 mg | Freq: Two times a day (BID) | INTRAVENOUS | Status: DC
  Administered 2019-11-09 – 2019-11-10 (×3): 40 mg via INTRAVENOUS
  Filled 2019-11-09 (×3): qty 40

## 2019-11-09 MED ORDER — PANTOPRAZOLE SODIUM 40 MG IV SOLR
40.0000 mg | Freq: Once | INTRAVENOUS | Status: AC
  Administered 2019-11-09: 40 mg via INTRAVENOUS
  Filled 2019-11-09: qty 40

## 2019-11-09 MED ORDER — SUTAB 1479-225-188 MG PO TABS: 1.0000 | ORAL_TABLET | Freq: Once | ORAL | 0 refills | Status: DC

## 2019-11-09 MED ORDER — LOSARTAN POTASSIUM 50 MG PO TABS
100.0000 mg | ORAL_TABLET | Freq: Every morning | ORAL | Status: DC
  Administered 2019-11-10 – 2019-11-12 (×3): 100 mg via ORAL
  Filled 2019-11-09 (×3): qty 2

## 2019-11-09 MED ORDER — LABETALOL HCL 5 MG/ML IV SOLN: 10.0000 mg | Freq: Once | INTRAVENOUS | Status: DC

## 2019-11-09 MED ORDER — SODIUM CHLORIDE 0.9 % IV SOLN: INTRAVENOUS | Status: DC

## 2019-11-09 MED ORDER — ALPRAZOLAM 0.25 MG PO TABS: 0.1250 mg | ORAL_TABLET | Freq: Two times a day (BID) | ORAL | Status: DC | PRN

## 2019-11-09 MED ORDER — SODIUM CHLORIDE 0.9% IV SOLUTION
Freq: Once | INTRAVENOUS | Status: AC
  Administered 2019-11-09: 1 mL via INTRAVENOUS

## 2019-11-09 MED ORDER — TAMSULOSIN HCL 0.4 MG PO CAPS
0.4000 mg | ORAL_CAPSULE | Freq: Every day | ORAL | Status: DC
  Administered 2019-11-10 – 2019-11-12 (×3): 0.4 mg via ORAL
  Filled 2019-11-09 (×3): qty 1

## 2019-11-09 MED ORDER — LEVOTHYROXINE SODIUM 75 MCG PO TABS
75.0000 ug | ORAL_TABLET | Freq: Every day | ORAL | Status: DC
  Administered 2019-11-10 – 2019-11-12 (×2): 75 ug via ORAL
  Filled 2019-11-09 (×2): qty 1

## 2019-11-09 MED ORDER — AMLODIPINE BESYLATE 5 MG PO TABS
2.5000 mg | ORAL_TABLET | Freq: Every day | ORAL | Status: DC
  Administered 2019-11-10 – 2019-11-12 (×3): 2.5 mg via ORAL
  Filled 2019-11-09 (×3): qty 1

## 2019-11-09 MED ORDER — ACETAMINOPHEN 325 MG PO TABS
650.0000 mg | ORAL_TABLET | Freq: Four times a day (QID) | ORAL | Status: DC | PRN
  Administered 2019-11-09 – 2019-11-12 (×3): 650 mg via ORAL
  Filled 2019-11-09 (×3): qty 2

## 2019-11-09 NOTE — ED Notes (Signed)
Pt is NSR on monitor 

## 2019-11-09 NOTE — Progress Notes (Signed)
New Admission Note:  Arrival Method: Via bed from ED to 95m18 Mental Orientation: Alert & Oriented x4 Telemetry: CCMD verified. Box-18 Assessment: Completed Skin: Refer to flowsheet IV: Right AC & Left FA Pain: 2/10 Tubes: Safety Measures: Safety Fall Prevention Plan discussed with patient. Admission: Completed [facility] [place] Orientation: Patient has been orientated to the room, unit and the staff.  Orders have been reviewed and are being implemented. Will continue to monitor the patient. Call light has been placed within reach and bed alarm has been activated.   Vassie Moselle, RN  Phone Number: [phone]

## 2019-11-09 NOTE — ED Provider Notes (Signed)
[facility] EMERGENCY DEPARTMENT Provider Note   CSN: [id number] Arrival date & time: 11/09/19  1103     History Chief Complaint  Patient presents with  . Abnormal Lab    Ronnie Snyder is a 79 y.o. male.  Patient presents after getting outside labs with a hemoglobin of 6.0, patient states she has had intermittent melena for the last several weeks to months.  Patient endorses fatigue, intermittent lightheadedness.  Patient is somewhat of a poor historian as he states that he has never had a blood transfusion before but was admitted to the hospital.  He was seen by GI this morning and discovered his anemia and sent to the ED for further evaluation.  GI states they contacted their inpatient team to make them aware of likely hospital admission.  Patient denies any pain anywhere  The history is provided by the patient and medical records.  Illness Location:  GI Quality:  Melena, suspected upper GI bleed Severity:  Moderate Onset quality:  Gradual Timing:  Constant Progression:  Worsening Chronicity:  Recurrent Context:  Patient endorsing several weeks of melena, fatigue, lightheadedness Relieved by:  Nothing tried Worsened by:  Nothing Ineffective treatments:  Current Protonix dosing Associated symptoms: fatigue   Associated symptoms: no abdominal pain, no chest pain, no loss of consciousness, no nausea, no shortness of breath and no vomiting        Past Medical History:  Diagnosis Date  . Abdominal aortic aneurysm (HCC)    3.5 cm 10/2016 L-spine CT (previously 3.0 cm by U/S 02/25/11)  . Anxiety   . Arthritis    "in my feet" (10/13/2012)  . GERD (gastroesophageal reflux disease)   . H/O hiatal hernia   . Hepatic steatosis   . High cholesterol    "at one time; it's fine now" (10/13/2012)  . HTN (hypertension)   . Hx of colonic polyps   . Hypothyroidism   . PAD (peripheral artery disease) ([place])   . Skin cancer    "burned them off my arm and such"  (10/13/2012)  . Stroke [facility])     Patient Active Problem List   Diagnosis Date Noted  . Black stools 11/09/2019  . Iron deficiency anemia due to chronic blood loss 11/09/2019  . Barrett's esophagus without dysplasia 08/30/2019  . GI bleed 08/18/2019  . Cerebral thrombosis with cerebral infarction 08/12/2019  . Symptomatic anemia 08/09/2019  . Acute respiratory failure with hypoxia ([place]) 08/09/2019  . Urinary hesitancy 10/26/2018  . Night sweats 10/26/2018  . Other specified hypothyroidism 05/29/2018  . Lumbar stenosis 05/23/2017  . Anemia 10/02/2016  . Ecchymosis 09/23/2013  . PAD (peripheral artery disease) ([place]) 08/09/2013  . Aftercare following surgery of the circulatory system, [place] 02/01/2013  . Hyponatremia 08/21/2012  . Smoker 08/13/2012  . Other and unspecified alcohol dependence, unspecified drinking behavior 08/13/2012  . Screening for prostate cancer 08/13/2012  . Antiplatelet or antithrombotic long-term use 08/13/2012  . Atherosclerosis of native artery of extremity with intermittent claudication ([place]) 08/21/2011  . ECZEMA 01/24/2010  . COUGH 01/24/2009  . AAA (abdominal aortic aneurysm) ([place]) 12/06/2008  . HYPERCHOLESTEROLEMIA 01/21/2008  . ERECTILE DYSFUNCTION, ORGANIC 01/21/2008  . Back pain 01/21/2008  . 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 Anxiety state 03/07/2007  . ERECTILE DYSFUNCTION 03/07/2007  . DEPRESSION 03/07/2007  . HTN (hypertension) 03/07/2007  . CAROTID ARTERY STENOSIS 03/07/2007  . HIP PAIN, RIGHT 03/07/2007  . COLONIC POLYPS, HX OF 03/07/2007  . ADENOIDECTOMY, HX OF 03/07/2007    Past Surgical  History:  Procedure Laterality Date  . ABDOMINAL ANGIOGRAM N/A 12/04/2011   Procedure: ABDOMINAL ANGIOGRAM;  Surgeon: Sherren Mocha, MD;  Location: [facility] C[facility] LAB;  Service: Cardiovascular;  Laterality: N/A;  . ABDOMINAL AORTAGRAM N/A 10/13/2012   Procedure: ABDOMINAL Maxcine Ham;  Surgeon: Serafina Mitchell, MD;  Location: [facility] C[facility];  Service: Cardiovascular;   Laterality: N/A;  . ANGIOPLASTY / STENTING FEMORAL Left 10/13/2012  . ESOPHAGOGASTRODUODENOSCOPY    . FEMORAL ENDARTERECTOMY Left 01/23/12   Left Endarterectomy  with bovie patch Angioplasy  . gated spect wall motion stress cardiolite  02/10/2002  . HIP FRACTURE SURGERY Right 1990's  . INGUINAL HERNIA REPAIR Right    "years ago" (10/13/2012)  . LUMBAR LAMINECTOMY/DECOMPRESSION MICRODISCECTOMY N/A 05/23/2017   Procedure: L4-5 DECOMPRESSION;  Surgeon: Marybelle Killings, MD;  Location: [facility];  Service: Orthopedics;  Laterality: N/A;  . NOSE SURGERY    . TONSILLECTOMY     "I was a chld" (10/13/2012)       Family History  Problem Relation Age of Onset  . Hypertension Mother   . Heart disease Mother   . Hypertension Father   . Heart disease Father   . Hypertension Sister   . Hypertension Sister   . Colon cancer Neg Hx   . Esophageal cancer Neg Hx   . Pancreatic cancer Neg Hx   . Stomach cancer Neg Hx     Social History   Tobacco Use  . Smoking status: Former Smoker    Packs/day: 0.25    Years: 57.00    Pack years: 14.25    Types: Cigarettes  . Smokeless tobacco: Never Used  . Tobacco comment: pt states there is no hope for quitting  Vaping Use  . Vaping Use: Never used  Substance Use Topics  . Alcohol use: Not Currently  . Drug use: No    Home Medications Prior to Admission medications   Medication Sig Start Date End Date Taking? Authorizing Provider  acetaminophen (TYLENOL) 500 MG tablet Take 500 mg by mouth every 6 (six) hours as needed for mild pain or headache.   Yes [provider]  ALPRAZolam (XANAX) 0.25 MG tablet TAKE 1 TABLET(0.25 MG) BY MOUTH TWICE DAILY AS NEEDED FOR ANXIETY Patient taking differently: Take 0.125-0.25 mg by mouth 2 (two) times daily as needed for anxiety.  12/02/18  Yes Luetta Nutting, DO  amLODipine (NORVASC) 2.5 MG tablet Take 1 tablet (2.5 mg total) by mouth daily. 08/30/19  Yes Luetta Nutting, DO  bismuth subsalicylate (PEPTO BISMOL) 262  MG/15ML suspension Take 30 mLs by mouth every 6 (six) hours as needed for diarrhea or loose stools.   Yes [provider]  cilostazol (PLETAL) 100 MG tablet Take 100 mg by mouth 2 (two) times daily.   Yes [provider]  levothyroxine (SYNTHROID) 75 MCG tablet TAKE 1 TABLET BY MOUTH EVERY DAY. PLEASE SCHEDULE VISIT WITH NEW PROVIDER Patient taking differently: Take 75 mcg by mouth daily before breakfast.  10/29/19  Yes Luetta Nutting, DO  losartan (COZAAR) 100 MG tablet Take 100 mg by mouth in the morning.  09/03/19  Yes [provider]  pantoprazole (PROTONIX) 40 MG tablet Take 1 tablet (40 mg total) by mouth 2 (two) times daily. Can switch to any approved PPI. Patient taking differently: Take 40 mg by mouth 2 (two) times daily.  09/16/19  Yes Luetta Nutting, DO  tamsulosin (FLOMAX) 0.4 MG CAPS capsule Take 1 capsule (0.4 mg total) by mouth daily. 10/30/18  Yes Luetta Nutting,  DO  clopidogrel (PLAVIX) 75 MG tablet Take 75 mg by mouth daily.  Patient not taking: Reported on 11/09/2019    [provider]  diclofenac Sodium (VOLTAREN) 1 % GEL Apply 2 g topically 4 (four) times daily. Patient not taking: Reported on 11/09/2019 07/31/19   Darr, Marguerita Beards, PA-C  lactulose (CHRONULAC) 10 GM/15ML solution Take 15 mLs (10 g total) by mouth 2 (two) times daily as needed for mild constipation. Patient not taking: Reported on 11/09/2019 08/15/19   Thurnell Lose, MD    Allergies    Hydrocodone-acetaminophen, Atorvastatin, Other, Penicillins, Pioglitazone, and Vicodin [hydrocodone-acetaminophen]  Review of Systems   Review of Systems  Constitutional: Positive for activity change and fatigue.  Respiratory: Negative for shortness of breath.   Cardiovascular: Negative for chest pain.  Gastrointestinal: Negative for abdominal pain, nausea and vomiting.  Skin: Positive for pallor.  Neurological: Positive for light-headedness. Negative for loss of consciousness.  All other  systems reviewed and are negative.   Physical Exam Updated Vital Signs BP (!) 155/76 (BP Location: Left Arm)   Pulse 66   Temp 98.2 F (36.8 C) (Oral)   Resp 17   SpO2 95%   Physical Exam Vitals and nursing note reviewed.  Constitutional:      Appearance: He is well-developed.  HENT:     Head: Normocephalic and atraumatic.     Right Ear: External ear normal.     Left Ear: External ear normal.     Nose: Nose normal.  Eyes:     Extraocular Movements: Extraocular movements intact.  Cardiovascular:     Rate and Rhythm: Normal rate and regular rhythm.     Heart sounds: No murmur heard.   Pulmonary:     Effort: Pulmonary effort is normal. No respiratory distress.     Breath sounds: Normal breath sounds.  Abdominal:     General: There is no distension.     Palpations: Abdomen is soft.     Tenderness: There is no abdominal tenderness.  Genitourinary:    Rectum: Guaiac result positive.  Musculoskeletal:        General: No swelling or tenderness. Normal range of motion.     Cervical back: Neck supple.  Skin:    General: Skin is warm and dry.  Neurological:     General: No focal deficit present.     Mental Status: He is alert and oriented to person, place, and time.     Motor: No weakness.     Gait: Gait normal.  Psychiatric:        Mood and Affect: Mood normal.        Behavior: Behavior normal.     ED Results / Procedures / Treatments   Labs (all labs ordered are listed, but only abnormal results are displayed) Labs Reviewed  CBC WITH DIFFERENTIAL/PLATELET - Abnormal; Notable for the following components:      Result Value   RBC 2.72 (*)    Hemoglobin 5.7 (*)    HCT 21.0 (*)    MCV 77.2 (*)    MCH 21.0 (*)    MCHC 27.1 (*)    RDW 19.6 (*)    All other components within normal limits  BASIC METABOLIC PANEL - Abnormal; Notable for the following components:   Sodium 129 (*)    Chloride 96 (*)    Glucose, Bld 130 (*)    Creatinine, Ser 0.53 (*)    Calcium 8.6  (*)    All other components within  normal limits  POC OCCULT BLOOD, ED - Abnormal; Notable for the following components:   Fecal Occult Bld POSITIVE (*)    All other components within normal limits  SARS CORONAVIRUS 2 BY RT PCR (HOSPITAL ORDER, [facility])  OCCULT BLOOD X 1 CARD TO LAB, STOOL  TYPE AND SCREEN  PREPARE RBC (CROSSMATCH)    EKG None  Radiology No results found.  Procedures Procedures (including critical care time)  Medications Ordered in ED Medications  0.9 %  sodium chloride infusion (Manually program via Guardrails IV Fluids) (1 mL Intravenous New Bag/Given 11/09/19 1715)  pantoprazole (PROTONIX) injection 40 mg (40 mg Intravenous Given 11/09/19 1715)  bisacodyl (DULCOLAX) EC tablet 20 mg (20 mg Oral Given 11/10/19 1025)    ED Course  I have reviewed the triage vital signs and the nursing notes.  Pertinent labs & imaging results that were available during my care of the patient were reviewed by me and considered in my medical decision making (see chart for details).  Clinical Course as of Nov 10 1646  Tue Nov 09, 2019  1556 RBC(!): 2.72 [AL]  1613 Hemoglobin(!!): 5.7 [AL]    Clinical Course User Index [AL] Delma Post, MD   MDM Rules/Calculators/A&P                          Differential diagnosis: GI bleed leading to symptomatic anemia,  ED physician interpretation of labs: Hemoglobin of 5.7.  Patient typed and screened and 3 units ordered.  Patient fecal occult blood test positive.  Has not been positive in the past.  Patient endorses melena consistent with his clinical picture  MDM: Patient is a 79 year old male with recent hospitalization for anemia who is at GI clinic found to have low hemoglobin with reported melena confirmed in the ED with both repeat CBC and fecal occult blood card with GI paged and patient into the hospital service for further treatment and evaluation.  Patient vitals are stable, patient afebrile.   Patient physical exam unremarkable.  Patient lightheadedness and fatigue consistent with significant anemia.  Denies any bright red blood per rectum but does endorse intermittent melena.  Likely patient upper GI bleed that is causing patient's anemia.  No further emergency medical intervention indicated at this time.  Patient stable for floor admission.  Diagnosis, treatment and plan of care was discussed and agreed upon with patient.  Patient comfortable with admission at this time.   Final Clinical Impression(s) / ED Diagnoses Final diagnoses:  Gastrointestinal hemorrhage with melena  Symptomatic anemia    Rx / DC Orders ED Discharge Orders    None       Delma Post, MD 11/10/19 Gordon, [place], MD 11/10/19 9741

## 2019-11-09 NOTE — Patient Instructions (Addendum)
If you are age 78 or older, your body mass index should be between 23-30. Your Body mass index is 22.08 kg/m. If this is out of the aforementioned range listed, please consider follow up with your Primary Care Provider.  If you are age 52 or younger, your body mass index should be between 19-25. Your Body mass index is 22.08 kg/m. If this is out of the aformentioned range listed, please consider follow up with your Primary Care Provider.   Your provider has requested that you go to the basement level for lab work before leaving today. Press "B" on the elevator. The lab is located at the first door on the left as you exit the elevator.  Call back with who prescribes Plavix and if you are taking your Pletal, ask to speak with Nira Conn, CMA.

## 2019-11-09 NOTE — Progress Notes (Signed)
11/09/2019 Ronnie Snyder [id number] 07/25/1940   HISTORY OF PRESENT ILLNESS:  This is a 79 year old male who was seen by me on August 30, 2019 for hospital follow-up regarding iron deficiency anemia.  Please see that note for further details.  Nonetheless, it was planned that he needed an endoscopy and a colonoscopy for heme negative iron deficiency anemia, but due to recent Covid infection and mild/small CVA we decided to postpone the procedures for a couple of months.  He is here today for that follow-up.  He tells me that he has seen dark stools intermittently since his last visit here.  He denies seeing any red blood in his stools.  His hemoglobin has not been rechecked since the beginning of April.  He tells me the overall he does not feel great, feels very tired and fatigue similar to his last hospitalization in March.  He continues on pantoprazole 40 mg twice daily.  He has Plavix on his medication list and also recently had a visit with with Dr. Trula Slade at which time it was noted that he was going to restart his Pletal, but that is not on his list today and he is not sure that he is taking it.  Upon calling the patient today with his lab results from this morning he went through his medications and says that he is in fact taking Pletal and is not taking Plavix.   Past Medical History:  Diagnosis Date   Abdominal aortic aneurysm ([place])    3.5 cm 10/2016 L-spine CT (previously 3.0 cm by U/S 02/25/11)   Anxiety    Arthritis    "in my feet" (10/13/2012)   GERD (gastroesophageal reflux disease)    H/O hiatal hernia    Hepatic steatosis    High cholesterol    "at one time; it's fine now" (10/13/2012)   HTN (hypertension)    Hx of colonic polyps    Hypothyroidism    PAD (peripheral artery disease) ([place])    Skin cancer    "burned them off my arm and such" (10/13/2012)   Stroke [facility])    Past Surgical History:  Procedure Laterality Date   ABDOMINAL ANGIOGRAM N/A  12/04/2011   Procedure: ABDOMINAL ANGIOGRAM;  Surgeon: Sherren Mocha, MD;  Location[facility] CATH LAB;  Service: Cardiovascular;  Laterality: N/A;   ABDOMINAL AORTAGRAM N/A 10/13/2012   Procedure: ABDOMINAL Maxcine Ham;  Surgeon: Serafina Mitchell, MD;  Location: [facility] CATH LAB;  Service: Cardiovascular;  Laterality: N/A;   ANGIOPLASTY / STENTING FEMORAL Left 10/13/2012   ESOPHAGOGASTRODUODENOSCOPY     FEMORAL ENDARTERECTOMY Left 01/23/12   Left Endarterectomy  with bovie patch Angioplasy   gated spect wall motion stress cardiolite  02/10/2002   HIP FRACTURE SURGERY Right 1990's   INGUINAL HERNIA REPAIR Right    "years ago" (10/13/2012)   LUMBAR LAMINECTOMY/DECOMPRESSION MICRODISCECTOMY N/A 05/23/2017   Procedure: L4-5 DECOMPRESSION;  Surgeon: Marybelle Killings, MD;  Location: [facility];  Service: Orthopedics;  Laterality: N/A;   NOSE SURGERY     TONSILLECTOMY     "I was a chld" (10/13/2012)    reports that he has quit smoking. His smoking use included cigarettes. He has a 14.25 pack-year smoking history. He has never used smokeless tobacco. He reports previous alcohol use. He reports that he does not use drugs. family history includes Heart disease in his father and mother; Hypertension in his father, mother, sister, and sister. Allergies  Allergen Reactions   Atorvastatin Nausea Only  Other Other (See Comments)    UNSPECIFIED REACTION  "ALL PAINS/NARCOTIC MEDS-CANNOT TOLERATE WELL" > per Med History prior to 05/23/17   Penicillins Rash    Has patient had a PCN reaction causing immediate rash, facial/tongue/throat swelling, SOB or lightheadedness with hypotension: Unknown Has patient had a PCN reaction causing severe rash involving mucus membranes or skin necrosis: No Has patient had a PCN reaction that required hospitalization: No Has patient had a PCN reaction occurring within the last 10 years: No If all of the above answers are "NO", then may proceed with Cephalosporin use.    Pioglitazone  Nausea Only   Vicodin [Hydrocodone-Acetaminophen] Nausea Only      Outpatient Encounter Medications as of 11/09/2019  Medication Sig   ALPRAZolam (XANAX) 0.25 MG tablet TAKE 1 TABLET(0.25 MG) BY MOUTH TWICE DAILY AS NEEDED FOR ANXIETY   amLODipine (NORVASC) 2.5 MG tablet Take 1 tablet (2.5 mg total) by mouth daily.   clopidogrel (PLAVIX) 75 MG tablet Take 75 mg by mouth daily.   diclofenac Sodium (VOLTAREN) 1 % GEL Apply 2 g topically 4 (four) times daily.   lactulose (CHRONULAC) 10 GM/15ML solution Take 15 mLs (10 g total) by mouth 2 (two) times daily as needed for mild constipation.   levothyroxine (SYNTHROID) 75 MCG tablet TAKE 1 TABLET BY MOUTH EVERY DAY. PLEASE SCHEDULE VISIT WITH NEW PROVIDER   losartan (COZAAR) 100 MG tablet    pantoprazole (PROTONIX) 40 MG tablet Take 1 tablet (40 mg total) by mouth 2 (two) times daily. Can switch to any approved PPI.   tamsulosin (FLOMAX) 0.4 MG CAPS capsule Take 1 capsule (0.4 mg total) by mouth daily.   No facility-administered encounter medications on file as of 11/09/2019.     REVIEW OF SYSTEMS  : All other systems reviewed and negative except where noted in the History of Present Illness.   PHYSICAL EXAM: BP 130/72    Pulse 76    Ht 5\' 7"  (1.702 m)    Wt 141 lb (64 kg)    BMI 22.08 kg/m  General: Well developed white male in no acute distress; pale gray skin tone Head: Normocephalic and atraumatic Eyes:  Sclerae anicteric, conjunctiva pink. Ears: Normal auditory acuity Lungs: Clear throughout to auscultation; no increased WOB. Heart: Regular rate and rhythm; no M/R/G. Abdomen: Soft, non-distended.  BS present.  Non-tender. Musculoskeletal: Symmetrical with no gross deformities  Skin: No lesions on visible extremities Extremities: No edema  Neurological: Alert oriented x 4, grossly non-focal Psychological:  Alert and cooperative. Normal mood and affect  ASSESSMENT AND PLAN: *79 year old male hospitalized with Covid and a  mild CVA in March.  Has extensive vascular history and is on Plavix, ? Pletal.  He was anemic with a hemoglobin in the 4 g range requiring 4 units of packed red blood cells and IV iron while in the hospital at that time.  Was Hemoccult negative at that time.  It was discussed that he would need an EGD and a colonoscopy, but that due to his recent medical issues requiring hospitalization, Hemoccult negative stools, increasing hemoglobin, and reports of feeling well at that time we decided to postpone the procedures and see him back in a couple of months.  I had asked that his PCP monitor his hemoglobin in the interim.  He presents today for his follow-up.  Reports intermittent black stools.  Hemoglobin has not been checked since he was here in April.  Needs EGD and colonoscopy.  We checked his Hgb STAT today  and it returned at 6 grams.  Patient was instructed to proceed to the hospital today for admission and inpatient evaluation.  Our inpatient team was made aware. *GERD and history of Barrett's in 2004:  Continue pantoprazole 40 mg BID for now.  Well overdue for surveillance.   CC:  Luetta Nutting, DO

## 2019-11-09 NOTE — ED Triage Notes (Signed)
Pt. Stated, they called and told me to come here stat . I have a low hgb.

## 2019-11-09 NOTE — Progress Notes (Signed)
Reviewed and agree with management plan.  Ressie Slevin T. Jeremi Losito, MD FACG [facility]  

## 2019-11-09 NOTE — H&P (Signed)
History and Physical    CLAYBORN MILNES KGU:[id number] DOB: [birth year] DOA: 11/09/2019  PCP: Luetta Nutting, DO Patient coming from: Home Chief Complaint: Generalized weakness and lightheadedness for last few days  HPI: Ronnie Snyder is a 79 y.o. male with medical history significant of iron deficiency anemia recent Covid, history of stroke sent in from GI office with low hemoglobin.  In the past he has been heme-negative.  In the ER he is heme positive today.  He is on Plavix at home.  He notices his stool to be dark in color.  He had loose stools today which is dark.  Denies any nausea vomiting.  No abdominal pain chest pain shortness of breath.  Is even walking.  He complains of generalized weakness and fatigue since last 2 months.  He has been on Protonix 40 mg twice a day.  Denies any fever chills or cough.  He denies chest pain shortness of breath.  ED Course: Hemoglobin 5.7 vital signs stable 3 units of blood transfusion ordered  Review of Systems: As per HPI otherwise all other systems reviewed and are negative  Ambulatory Status: Ambulatory at baseline  Past Medical History:  Diagnosis Date  . Abdominal aortic aneurysm (HCC)    3.5 cm 10/2016 L-spine CT (previously 3.0 cm by U/S 02/25/11)  . Anxiety   . Arthritis    "in my feet" (10/13/2012)  . GERD (gastroesophageal reflux disease)   . H/O hiatal hernia   . Hepatic steatosis   . High cholesterol    "at one time; it's fine now" (10/13/2012)  . HTN (hypertension)   . Hx of colonic polyps   . Hypothyroidism   . PAD (peripheral artery disease) ([place])   . Skin cancer    "burned them off my arm and such" (10/13/2012)  . Stroke [facility])     Past Surgical History:  Procedure Laterality Date  . ABDOMINAL ANGIOGRAM N/A 12/04/2011   Procedure: ABDOMINAL ANGIOGRAM;  Surgeon: Sherren Mocha, MD;  Location: [facility] CATH LAB;  Service: Cardiovascular;  Laterality: N/A;  . ABDOMINAL AORTAGRAM N/A 10/13/2012   Procedure: ABDOMINAL  Maxcine Ham;  Surgeon: Serafina Mitchell, MD;  Location: [facility] CATH LAB;  Service: Cardiovascular;  Laterality: N/A;  . ANGIOPLASTY / STENTING FEMORAL Left 10/13/2012  . ESOPHAGOGASTRODUODENOSCOPY    . FEMORAL ENDARTERECTOMY Left 01/23/12   Left Endarterectomy  with bovie patch Angioplasy  . gated spect wall motion stress cardiolite  02/10/2002  . HIP FRACTURE SURGERY Right 1990's  . INGUINAL HERNIA REPAIR Right    "years ago" (10/13/2012)  . LUMBAR LAMINECTOMY/DECOMPRESSION MICRODISCECTOMY N/A 05/23/2017   Procedure: L4-5 DECOMPRESSION;  Surgeon: Marybelle Killings, MD;  Location: [place];  Service: Orthopedics;  Laterality: N/A;  . NOSE SURGERY    . TONSILLECTOMY     "I was a chld" (10/13/2012)    Social History   Socioeconomic History  . Marital status: Married    Spouse name: Not on file  . Number of children: 2  . Years of education: 12  . Highest education level: Not on file  Occupational History  . Occupation: retired    Comment: optician  Tobacco Use  . Smoking status: Former Smoker    Packs/day: 0.25    Years: 57.00    Pack years: 14.25    Types: Cigarettes  . Smokeless tobacco: Never Used  . Tobacco comment: pt states there is no hope for quitting  Vaping Use  . Vaping Use: Never used  Substance and Sexual Activity  .  Alcohol use: Not Currently  . Drug use: No  . Sexual activity: Not on file  Other Topics Concern  . Not on file  Social History Narrative   Lives with wife, is her caregiver   Social Determinants of Health   Financial Resource Strain:   . Difficulty of Paying Living Expenses:   Food Insecurity:   . Worried About Charity fundraiser in the Last Year:   . Arboriculturist in the Last Year:   Transportation Needs:   . Film/video editor (Medical):   Marland Kitchen Lack of Transportation (Non-Medical):   Physical Activity:   . Days of Exercise per Week:   . Minutes of Exercise per Session:   Stress:   . Feeling of Stress :   Social Connections:   . Frequency of  Communication with Friends and Family:   . Frequency of Social Gatherings with Friends and Family:   . Attends Religious Services:   . Active Member of Clubs or Organizations:   . Attends Archivist Meetings:   Marland Kitchen Marital Status:   Intimate Partner Violence:   . Fear of Current or Ex-Partner:   . Emotionally Abused:   Marland Kitchen Physically Abused:   . Sexually Abused:     Allergies  Allergen Reactions  . Hydrocodone-Acetaminophen Nausea Only    Vertigo, also  . Atorvastatin Nausea Only  . Other Nausea Only and Other (See Comments)    Vertigo and nausea: "ALL PAINS/NARCOTIC MEDS-CANNOT TOLERATE WELL,"  per Med History prior to 05/23/17  . Penicillins Rash    Has patient had a PCN reaction causing immediate rash, facial/tongue/throat swelling, SOB or lightheadedness with hypotension: Unknown Has patient had a PCN reaction causing severe rash involving mucus membranes or skin necrosis: No Has patient had a PCN reaction that required hospitalization: No Has patient had a PCN reaction occurring within the last 10 years: No If all of the above answers are "NO", then may proceed with Cephalosporin use.   . Pioglitazone Nausea Only  . Vicodin [Hydrocodone-Acetaminophen] Nausea Only    Family History  Problem Relation Age of Onset  . Hypertension Mother   . Heart disease Mother   . Hypertension Father   . Heart disease Father   . Hypertension Sister   . Hypertension Sister   . Colon cancer Neg Hx   . Esophageal cancer Neg Hx   . Pancreatic cancer Neg Hx   . Stomach cancer Neg Hx       Prior to Admission medications   Medication Sig Start Date End Date Taking? Authorizing Provider  acetaminophen (TYLENOL) 500 MG tablet Take 500 mg by mouth every 6 (six) hours as needed for mild pain or headache.   Yes [provider]  ALPRAZolam (XANAX) 0.25 MG tablet TAKE 1 TABLET(0.25 MG) BY MOUTH TWICE DAILY AS NEEDED FOR ANXIETY Patient taking differently: Take 0.125-0.25 mg by  mouth 2 (two) times daily as needed for anxiety.  12/02/18  Yes Luetta Nutting, DO  amLODipine (NORVASC) 2.5 MG tablet Take 1 tablet (2.5 mg total) by mouth daily. 08/30/19  Yes Luetta Nutting, DO  bismuth subsalicylate (PEPTO BISMOL) 262 MG/15ML suspension Take 30 mLs by mouth every 6 (six) hours as needed for diarrhea or loose stools.   Yes [provider]  cilostazol (PLETAL) 100 MG tablet Take 100 mg by mouth 2 (two) times daily.   Yes [provider]  levothyroxine (SYNTHROID) 75 MCG tablet TAKE 1 TABLET BY MOUTH EVERY DAY. PLEASE  SCHEDULE VISIT WITH NEW PROVIDER Patient taking differently: Take 75 mcg by mouth daily before breakfast.  10/29/19  Yes Luetta Nutting, DO  losartan (COZAAR) 100 MG tablet Take 100 mg by mouth in the morning.  09/03/19  Yes [provider]  pantoprazole (PROTONIX) 40 MG tablet Take 1 tablet (40 mg total) by mouth 2 (two) times daily. Can switch to any approved PPI. Patient taking differently: Take 40 mg by mouth 2 (two) times daily.  09/16/19  Yes Luetta Nutting, DO  tamsulosin (FLOMAX) 0.4 MG CAPS capsule Take 1 capsule (0.4 mg total) by mouth daily. 10/30/18  Yes Luetta Nutting, DO  clopidogrel (PLAVIX) 75 MG tablet Take 75 mg by mouth daily.  Patient not taking: Reported on 11/09/2019    [provider]  diclofenac Sodium (VOLTAREN) 1 % GEL Apply 2 g topically 4 (four) times daily. Patient not taking: Reported on 11/09/2019 07/31/19   Darr, Marguerita Beards, PA-C  lactulose (CHRONULAC) 10 GM/15ML solution Take 15 mLs (10 g total) by mouth 2 (two) times daily as needed for mild constipation. Patient not taking: Reported on 11/09/2019 08/15/19   Thurnell Lose, MD  Sodium Sulfate-Mag Sulfate-KCl (SUTAB) [phone] MG TABS Take 1 kit by mouth once for 1 dose. BIN: [id number] PCN: CN GROUP: [id number] MEMBER ID: [id number]; DO NOT RUN AS CASH 11/09/19 11/09/19  Loralie Champagne, PA-C    Physical Exam: Vitals:   11/09/19 1127 11/09/19 1407 11/09/19  1545 11/09/19 1600  BP: (!) 121/59 (!) 159/75 (!) 162/70   Pulse: 92 82 76 74  Resp: 20 16 18 20   Temp: 98.3 F (36.8 C) 100 F (37.8 C)    TempSrc: Oral Oral    SpO2: 98% 100% 100% 99%     . General:  Appears calm and comfortable . Eyes:  PERRL, EOMI, normal lids, iris pale . ENT:  grossly normal hearing, lips & tongue, mmm dry  . Neck:  no LAD, masses or thyromegaly . Cardiovascular:  RRR, no m/r/g. No LE edema.  Marland Kitchen Respiratory: CTA bilaterally, no w/r/r. Normal respiratory effort. . Abdomen:soft, ntnd, NABS . Skin: no rash or induration seen on limited exam . Musculoskeletal: grossly normal tone BUE/BLE, good ROM, no bony abnormality . Psychiatric:  grossly normal mood and affect, speech fluent and appropriate, AOx3 . Neurologic: CN 2-12 grossly intact, moves all extremities in coordinated fashion, sensation intact  Labs on Admission: I have personally reviewed following labs and imaging studies  CBC: Recent Labs  Lab 11/09/19 0925 11/09/19 1210  WBC 3.9* 4.0  NEUTROABS 2.0 2.2  HGB 6.0 Repeated and verified X2.* 5.7*  HCT 19.8 Repeated and verified X2.* 21.0*  MCV 71.9* 77.2*  PLT 272.0 998   Basic Metabolic Panel: Recent Labs  Lab 11/09/19 1210  NA 129*  K 4.1  CL 96*  CO2 23  GLUCOSE 130*  BUN 12  CREATININE 0.53*  CALCIUM 8.6*   GFR: Estimated Creatinine Clearance: 68.9 mL/min (A) (by C-G formula based on SCr of 0.53 mg/dL (L)). Liver Function Tests: No results for input(s): AST, ALT, ALKPHOS, BILITOT, PROT, ALBUMIN in the last 168 hours. No results for input(s): LIPASE, AMYLASE in the last 168 hours. No results for input(s): AMMONIA in the last 168 hours. Coagulation Profile: No results for input(s): INR, PROTIME in the last 168 hours. Cardiac Enzymes: No results for input(s): CKTOTAL, CKMB, CKMBINDEX, TROPONINI in the last 168 hours. BNP (last 3 results) No results for input(s): PROBNP in the last 8760 hours. HbA1C:  No results for input(s):  HGBA1C in the last 72 hours. CBG: No results for input(s): GLUCAP in the last 168 hours. Lipid Profile: No results for input(s): CHOL, HDL, LDLCALC, TRIG, CHOLHDL, LDLDIRECT in the last 72 hours. Thyroid Function Tests: No results for input(s): TSH, T4TOTAL, FREET4, T3FREE, THYROIDAB in the last 72 hours. Anemia Panel: No results for input(s): VITAMINB12, FOLATE, FERRITIN, TIBC, IRON, RETICCTPCT in the last 72 hours. Urine analysis:    Component Value Date/Time   COLORURINE YELLOW 10/02/2017 0824   APPEARANCEUR Cloudy (A) 10/02/2017 0824   LABSPEC 1.020 12/17/2018 0843   PHURINE 7.0 12/17/2018 0843   GLUCOSEU NEGATIVE 12/17/2018 0843   GLUCOSEU NEGATIVE 10/02/2017 0824   HGBUR NEGATIVE 12/17/2018 0843   HGBUR negative 11/13/2007 1003   BILIRUBINUR NEGATIVE 12/17/2018 0843   BILIRUBINUR NEGATIVE 10/26/2018 1027   KETONESUR NEGATIVE 12/17/2018 0843   PROTEINUR NEGATIVE 12/17/2018 0843   UROBILINOGEN 0.2 12/17/2018 0843   NITRITE NEGATIVE 12/17/2018 0843   LEUKOCYTESUR NEGATIVE 12/17/2018 0843    Creatinine Clearance: Estimated Creatinine Clearance: 68.9 mL/min (A) (by C-G formula based on SCr of 0.53 mg/dL (L)).  Sepsis Labs: @LABRCNTIP (procalcitonin:4,lacticidven:4) )No results found for this or any previous visit (from the past 240 hour(s)).   Radiological Exams on Admission: No results found. Assessment/Plan Active Problems:   * No active hospital problems. *   #1 GI bleed upper -patient admitted with generalized weakness and low hemoglobin and dark stools sent in from GI office. Starting on IV Protonix clear liquids n.p.o. after midnight GI to see him for EGD plus or minus colonoscopy.  Patient reports he never had EGD in the past his colonoscopy was long time ago. Hold Plavix 3 units of blood transfusion ordered in the ER. Hemoglobin 5.7 on admission.  April 2021 his hemoglobin was 10. m CV 77  #2 hyponatremia sodium 129-stat normal saline  #3 history of  essential hypertension restart Cozaar and Norvasc.   #4 BPH continue Flomax  #5 hypothyroidism continue Synthroid  Severity of Illness: The appropriate patient status for this patient is INPATIENT. Inpatient status is judged to be reasonable and necessary in order to provide the required intensity of service to ensure the patient's safety. The patient's presenting symptoms, physical exam findings, and initial radiographic and laboratory data in the context of their chronic comorbidities is felt to place them at high risk for further clinical deterioration. Furthermore, it is not anticipated that the patient will be medically stable for discharge from the hospital within 2 midnights of admission. The following factors support the patient status of inpatient.   " The patient's presenting symptoms include generalized weakness and lightheadedness " The worrisome physical exam findings include pale, tachycardic " The initial radiographic and laboratory data are worrisome because of overlying 5.7 " The chronic co-morbidities include chronic iron deficiency anemia   * I certify that at the point of admission it is my clinical judgment that the patient will require inpatient hospital care spanning beyond 2 midnights from the point of admission due to high intensity of service, high risk for further deterioration and high frequency of surveillance required.*     Estimated body mass index is 22.08 kg/m as calculated from the following:   Height as of an earlier encounter on 11/09/19: 5' 7"  (1.702 m).   Weight as of an earlier encounter on 11/09/19: 64 kg.   DVT prophylaxis: SCD Code Status: Full code Family Communication: None at bedside Disposition Plan: Pending clinical work-up Consults called: ED called GI  Admission status: Inpatient   Georgette Shell MD [facility]  If 7PM-7AM, please contact night-coverage [email or web address] Password [email or web address]  11/09/2019, 5:20 PM

## 2019-11-09 NOTE — ED Notes (Signed)
PT placed on hosp bed.

## 2019-11-09 NOTE — ED Notes (Signed)
Attempted to give report and was told the nurse will call back 

## 2019-11-10 DIAGNOSIS — D500 Iron deficiency anemia secondary to blood loss (chronic): Secondary | ICD-10-CM

## 2019-11-10 DIAGNOSIS — E871 Hypo-osmolality and hyponatremia: Secondary | ICD-10-CM

## 2019-11-10 DIAGNOSIS — D649 Anemia, unspecified: Secondary | ICD-10-CM

## 2019-11-10 LAB — TYPE AND SCREEN
ABO/RH(D): AB POS
Antibody Screen: NEGATIVE
Unit division: 0
Unit division: 0
Unit division: 0

## 2019-11-10 LAB — BPAM RBC
Blood Product Expiration Date: 202107032359
Blood Product Expiration Date: 202107222359
Blood Product Expiration Date: 202107232359
ISSUE DATE / TIME: 202106221749
ISSUE DATE / TIME: 202106222010
ISSUE DATE / TIME: 202106222343
Unit Type and Rh: 6200
Unit Type and Rh: 8400
Unit Type and Rh: 8400

## 2019-11-10 LAB — CBC
HCT: 32.2 % — ABNORMAL LOW (ref 39.0–52.0)
Hemoglobin: 9.6 g/dL — ABNORMAL LOW (ref 13.0–17.0)
MCH: 23.9 pg — ABNORMAL LOW (ref 26.0–34.0)
MCHC: 29.8 g/dL — ABNORMAL LOW (ref 30.0–36.0)
MCV: 80.3 fL (ref 80.0–100.0)
Platelets: 254 10*3/uL (ref 150–400)
RBC: 4.01 MIL/uL — ABNORMAL LOW (ref 4.22–5.81)
RDW: 19.3 % — ABNORMAL HIGH (ref 11.5–15.5)
WBC: 11 10*3/uL — ABNORMAL HIGH (ref 4.0–10.5)
nRBC: 0 % (ref 0.0–0.2)

## 2019-11-10 LAB — PROTIME-INR
INR: 1.1 (ref 0.8–1.2)
Prothrombin Time: 13.5 seconds (ref 11.4–15.2)

## 2019-11-10 LAB — COMPREHENSIVE METABOLIC PANEL
ALT: 13 U/L (ref 0–44)
AST: 14 U/L — ABNORMAL LOW (ref 15–41)
Albumin: 3.6 g/dL (ref 3.5–5.0)
Alkaline Phosphatase: 79 U/L (ref 38–126)
Anion gap: 9 (ref 5–15)
BUN: 5 mg/dL — ABNORMAL LOW (ref 8–23)
CO2: 26 mmol/L (ref 22–32)
Calcium: 8.9 mg/dL (ref 8.9–10.3)
Chloride: 99 mmol/L (ref 98–111)
Creatinine, Ser: 0.55 mg/dL — ABNORMAL LOW (ref 0.61–1.24)
GFR calc Af Amer: >60 mL/min (ref >60)
GFR calc non Af Amer: >60 mL/min (ref >60)
Glucose, Bld: 96 mg/dL (ref 70–99)
Potassium: 4.3 mmol/L (ref 3.5–5.1)
Sodium: 134 mmol/L — ABNORMAL LOW (ref 135–145)
Total Bilirubin: 0.9 mg/dL (ref 0.3–1.2)
Total Protein: 5.8 g/dL — ABNORMAL LOW (ref 6.5–8.1)

## 2019-11-10 MED ORDER — METOCLOPRAMIDE HCL 5 MG/ML IJ SOLN
10.0000 mg | Freq: Once | INTRAMUSCULAR | Status: AC
  Administered 2019-11-10: 10 mg via INTRAVENOUS
  Filled 2019-11-10: qty 2

## 2019-11-10 MED ORDER — PEG-KCL-NACL-NASULF-NA ASC-C 100 G PO SOLR
0.5000 | Freq: Once | ORAL | Status: AC
  Administered 2019-11-10: 100 g via ORAL
  Filled 2019-11-10 (×2): qty 1

## 2019-11-10 MED ORDER — BISACODYL 5 MG PO TBEC
20.0000 mg | DELAYED_RELEASE_TABLET | Freq: Once | ORAL | Status: AC
  Administered 2019-11-10: 20 mg via ORAL
  Filled 2019-11-10: qty 4

## 2019-11-10 MED ORDER — PEG-KCL-NACL-NASULF-NA ASC-C 100 G PO SOLR
0.5000 | Freq: Once | ORAL | Status: AC
  Administered 2019-11-10: 100 g via ORAL
  Filled 2019-11-10: qty 1

## 2019-11-10 MED ORDER — PEG-KCL-NACL-NASULF-NA ASC-C 100 G PO SOLR: 1.0000 | Freq: Once | ORAL | Status: DC

## 2019-11-10 NOTE — Progress Notes (Signed)
Daily Rounding Note  11/10/2019, 9:33 AM  LOS: 1 day   SUBJECTIVE:   Chief complaint:  Symptomatic anemia.  Black stools, FOBT +.   Patient feeling better after 3 units of blood.  Reports he had loose black stool on Tuesday, 2 days ago and previous intermittent formed black stools over the last 6 or 7 weeks.  Denies shortness of breath.  OBJECTIVE:         Vital signs in last 24 hours:    Temp:  [97.7 F (36.5 C)-100 F (37.8 C)] 98.2 F (36.8 C) (06/23 0701) Pulse Rate:  [66-92] 66 (06/23 0701) Resp:  [16-24] 17 (06/23 0315) BP: (121-196)/(59-98) 155/76 (06/23 0701) SpO2:  [93 %-100 %] 95 % (06/23 0701)   There were no vitals filed for this visit. General: Ashen, pale coloring.  Looks moderately chronically ill but alert and comfortable. Heart: RRR.  NSR, rate 68 on telemetry monitor.  No MRG. Chest: Globally diminished breath sounds.  No adventitious sounds.  No increased work of breathing, no cough. Abdomen: Soft, thin, NT, ND.  No masses, HSM, bruits, hernias. Extremities: No CCE. Neuro/Psych: Alert.  Good historian.  Oriented x3.  Moves all 4 limbs with full strength.  No tremors or gross deficits.  Intake/Output from previous day: 06/22 0701 - 06/23 0700 In: 1666 [P.O.:220; I.V.:1144; Blood:302] Out: 1150 [Urine:1150]  Intake/Output this shift: No intake/output data recorded.  Lab Results: Recent Labs    11/09/19 0925 11/09/19 1210 11/10/19 0428  WBC 3.9* 4.0 11.0*  HGB 6.0 Repeated and verified X2.* 5.7* 9.6*  HCT 19.8 Repeated and verified X2.* 21.0* 32.2*  PLT 272.0 272 254   BMET Recent Labs    11/09/19 1210 11/10/19 0428  NA 129* 134*  K 4.1 4.3  CL 96* 99  CO2 23 26  GLUCOSE 130* 96  BUN 12 5*  CREATININE 0.53* 0.55*  CALCIUM 8.6* 8.9   LFT Recent Labs    11/10/19 0428  PROT 5.8*  ALBUMIN 3.6  AST 14*  ALT 13  ALKPHOS 79  BILITOT 0.9   PT/INR Recent Labs     11/10/19 0428  LABPROT 13.5  INR 1.1   Hepatitis Panel No results for input(s): HEPBSAG, HCVAB, HEPAIGM, HEPBIGM in the last 72 hours.  Studies/Results: No results found. Scheduled Meds: . amLODipine  2.5 mg Oral Daily  . labetalol  10 mg Intravenous Once  . levothyroxine  75 mcg Oral QAC breakfast  . losartan  100 mg Oral q AM  . pantoprazole (PROTONIX) IV  40 mg Intravenous Q12H  . tamsulosin  0.4 mg Oral Daily   Continuous Infusions: . sodium chloride 100 mL/hr at 11/10/19 0610   PRN Meds:.acetaminophen, ALPRAZolam  ASSESMENT:   *  Acute on chronic anemia w iron deficiency, symptomatic.    Intermittent black BM beginning early 09/2019 and more recent black and loose stools.  FOBT positive. No prior Colon or EGD.   Colonoscopy and EGD delayed initially due to 07/2019 hospitilazation w active Covid 2 and small ischemic CVA.   Colon diverticulosis, 3.7 x 3.1 infrarenal aneurysm per CTAP w contrast 3/23.   Compliant with Protonix 40 mg po bid at home.   Hgb 4.8 on 3/22 >> transfused >> 10 on 4/7.   5.7 on 6/22 >> 3 PRBCs >> 9.6.  Patient feels better after transfusion. R/o neoplasia, PUD, vascular malformations, polyps.    *   Small CVA 07/2019.  Plavix added  but pt is not taking, taking Pletal per vasc surgery rec.    *   Bil claudication, ASPVD.  AAA.  Iliofemoral endarterectomy, angioplasty 01/2012.  L fem pop atherectomy, stent 09/2012.  OV 5/24 w Dr Trula Slade. Restarted Pletal.  Pt declined angiography.     *   Hypothyroidism.  TSH 2.1 in  07/2019.      *     Hyponatremia.  Na 129 >> 134.  NS running at 100 mL/hour.     PLAN   *   EGD and Colonoscopy tmrw at 0830.  Risks, benefits discussed with patient, he is agreeable.  Split dose bowel prep begins tonight.  Keep on clears.  Continue Protonix 40 IV bid for now.      Azucena Freed  11/10/2019, 9:33 AM Phone [phone]

## 2019-11-10 NOTE — H&P (View-Only) (Signed)
Daily Rounding Note  11/10/2019, 9:33 AM  LOS: 1 day   SUBJECTIVE:   Chief complaint:  Symptomatic anemia.  Black stools, FOBT +.   Patient feeling better after 3 units of blood.  Reports he had loose black stool on Tuesday, 2 days ago and previous intermittent formed black stools over the last 6 or 7 weeks.  Denies shortness of breath.  OBJECTIVE:         Vital signs in last 24 hours:    Temp:  [97.7 F (36.5 C)-100 F (37.8 C)] 98.2 F (36.8 C) (06/23 0701) Pulse Rate:  [66-92] 66 (06/23 0701) Resp:  [16-24] 17 (06/23 0315) BP: (121-196)/(59-98) 155/76 (06/23 0701) SpO2:  [93 %-100 %] 95 % (06/23 0701)   There were no vitals filed for this visit. General: Ashen, pale coloring.  Looks moderately chronically ill but alert and comfortable. Heart: RRR.  NSR, rate 68 on telemetry monitor.  No MRG. Chest: Globally diminished breath sounds.  No adventitious sounds.  No increased work of breathing, no cough. Abdomen: Soft, thin, NT, ND.  No masses, HSM, bruits, hernias. Extremities: No CCE. Neuro/Psych: Alert.  Good historian.  Oriented x3.  Moves all 4 limbs with full strength.  No tremors or gross deficits.  Intake/Output from previous day: 06/22 0701 - 06/23 0700 In: 1666 [P.O.:220; I.V.:1144; Blood:302] Out: 1150 [Urine:1150]  Intake/Output this shift: No intake/output data recorded.  Lab Results: Recent Labs    11/09/19 0925 11/09/19 1210 11/10/19 0428  WBC 3.9* 4.0 11.0*  HGB 6.0 Repeated and verified X2.* 5.7* 9.6*  HCT 19.8 Repeated and verified X2.* 21.0* 32.2*  PLT 272.0 272 254   BMET Recent Labs    11/09/19 1210 11/10/19 0428  NA 129* 134*  K 4.1 4.3  CL 96* 99  CO2 23 26  GLUCOSE 130* 96  BUN 12 5*  CREATININE 0.53* 0.55*  CALCIUM 8.6* 8.9   LFT Recent Labs    11/10/19 0428  PROT 5.8*  ALBUMIN 3.6  AST 14*  ALT 13  ALKPHOS 79  BILITOT 0.9   PT/INR Recent Labs     11/10/19 0428  LABPROT 13.5  INR 1.1   Hepatitis Panel No results for input(s): HEPBSAG, HCVAB, HEPAIGM, HEPBIGM in the last 72 hours.  Studies/Results: No results found. Scheduled Meds: . amLODipine  2.5 mg Oral Daily  . labetalol  10 mg Intravenous Once  . levothyroxine  75 mcg Oral QAC breakfast  . losartan  100 mg Oral q AM  . pantoprazole (PROTONIX) IV  40 mg Intravenous Q12H  . tamsulosin  0.4 mg Oral Daily   Continuous Infusions: . sodium chloride 100 mL/hr at 11/10/19 0610   PRN Meds:.acetaminophen, ALPRAZolam  ASSESMENT:   *  Acute on chronic anemia w iron deficiency, symptomatic.    Intermittent black BM beginning early 09/2019 and more recent black and loose stools.  FOBT positive. No prior Colon or EGD.   Colonoscopy and EGD delayed initially due to 07/2019 hospitilazation w active Covid 62 and small ischemic CVA.   Colon diverticulosis, 3.7 x 3.1 infrarenal aneurysm per CTAP w contrast 3/23.   Compliant with Protonix 40 mg po bid at home.   Hgb 4.8 on 3/22 >> transfused >> 10 on 4/7.   5.7 on 6/22 >> 3 PRBCs >> 9.6.  Patient feels better after transfusion. R/o neoplasia, PUD, vascular malformations, polyps.    *   Small CVA 07/2019.  Plavix added  but pt is not taking, taking Pletal per vasc surgery rec.    *   Bil claudication, ASPVD.  AAA.  Iliofemoral endarterectomy, angioplasty 01/2012.  L fem pop atherectomy, stent 09/2012.  OV 5/24 w Dr Trula Slade. Restarted Pletal.  Pt declined angiography.     *   Hypothyroidism.  TSH 2.1 in  07/2019.      *     Hyponatremia.  Na 129 >> 134.  NS running at 100 mL/hour.     PLAN   *   EGD and Colonoscopy tmrw at 0830.  Risks, benefits discussed with patient, he is agreeable.  Split dose bowel prep begins tonight.  Keep on clears.  Continue Protonix 40 IV bid for now.      Ronnie Snyder  11/10/2019, 9:33 AM Phone [phone]

## 2019-11-10 NOTE — Anesthesia Preprocedure Evaluation (Addendum)
Anesthesia Evaluation  Patient identified by MRN, date of birth, ID band Patient awake    Reviewed: Allergy & Precautions, NPO status , Patient's Chart, lab work & pertinent test results  Airway Mallampati: II  TM Distance: >3 FB Neck ROM: Full    Dental  (+) Dental Advisory Given   Pulmonary Current Smoker and Patient abstained from smoking., former smoker,    breath sounds clear to auscultation       Cardiovascular hypertension, Pt. on medications + Peripheral Vascular Disease   Rhythm:Regular Rate:Normal  Normal LVEF. Mild mod AS   Neuro/Psych CVA    GI/Hepatic Neg liver ROS, hiatal hernia, GERD  ,  Endo/Other  Hypothyroidism   Renal/GU negative Renal ROS     Musculoskeletal  (+) Arthritis ,   Abdominal   Peds  Hematology  (+) anemia ,   Anesthesia Other Findings   Reproductive/Obstetrics                            Anesthesia Physical Anesthesia Plan  ASA: III  Anesthesia Plan: MAC   Post-op Pain Management:    Induction:   PONV Risk Score and Plan: 1 and Propofol infusion, Ondansetron and Treatment may vary due to age or medical condition  Airway Management Planned: Natural Airway and Nasal Cannula  Additional Equipment:   Intra-op Plan:   Post-operative Plan:   Informed Consent: I have reviewed the patients History and Physical, chart, labs and discussed the procedure including the risks, benefits and alternatives for the proposed anesthesia with the patient or authorized representative who has indicated his/her understanding and acceptance.       Plan Discussed with: CRNA  Anesthesia Plan Comments:        Anesthesia Quick Evaluation

## 2019-11-10 NOTE — Plan of Care (Signed)
  Problem: Education: Goal: Knowledge of General Education information will improve Description Including pain rating scale, medication(s)/side effects and non-pharmacologic comfort measures Outcome: Progressing   

## 2019-11-10 NOTE — Progress Notes (Signed)
PROGRESS NOTE    Ronnie Snyder  [name]:[id number] DOB: April 23, 1941 DOA: 11/09/2019 PCP: Luetta Nutting, DO   Brief Narrative: Ronnie Snyder is a 79 y.o. male with medical history significant of iron deficiency anemia recent Covid, history of stroke sent in from GI office with low hemoglobin.  In the past he has been heme-negative.  In the ER he is heme positive today.  He is on Plavix at home.  He notices his stool to be dark in color.  He had loose stools today which is dark.  Denies any nausea vomiting.  No abdominal pain chest pain shortness of breath.  Is even walking.  He complains of generalized weakness and fatigue since last 2 months.  He has been on Protonix 40 mg twice a day.  Denies any fever chills or cough.  He denies chest pain shortness of breath.  ED Course: Hemoglobin 5.7 vital signs stable 3 units of blood transfusion ordered  Assessment & Plan:   Active Problems:   HTN (hypertension)   Hyponatremia   GI bleed   Iron deficiency anemia due to chronic blood loss    #1 GI bleed upper likely-admitted with hemoglobin 5.7 received 3 units of blood transfusion now hemoglobin up to 9.6. Appreciate GI input for EGD and colonoscopy in a.m.  N.p.o. after midnight. Continue IV Protonix. Patient on Plavix and Pletal at home which has been on hold for EGD colonoscopy tomorrow. He reports he has not been taking Plavix at home.  #2 iron deficiency anemia as above GI work-up  #3 hyponatremia resolved with normal saline  #4 history of stroke on Plavix at home  #5 history of essential hypertension on Cozaar and Norvasc.  Blood pressure 155/76.  #6 BPH on Flomax  #7 hypothyroidism on Synthroid.    Estimated body mass index is 22.08 kg/m as calculated from the following:   Height as of an earlier encounter on 11/09/19: 5' 7"  (1.702 m).   Weight as of an earlier encounter on 11/09/19: 64 kg.  DVT prophylaxis: SCD Code Status: Full code Family Communication: None  at bedside Disposition Plan:  Status is: Inpatient  Dispo: The patient is from: Home              Anticipated d/c is to: Home              Anticipated d/c date is: Unknown              Patient currently is not medically stable to d/c.    Consultants: GI   Procedures: None Antimicrobials: None  Subjective: Resting in bed feels better after blood transfusion no further bowel movements reported no nausea vomiting or abdominal pain.  Objective: Vitals:   11/09/19 2347 11/10/19 0005 11/10/19 0315 11/10/19 0701  BP: (!) 169/65 (!) 166/77 139/70 (!) 155/76  Pulse: 76 77 74 66  Resp: 18 18 17    Temp: 97.9 F (36.6 C) 98.3 F (36.8 C) 97.8 F (36.6 C) 98.2 F (36.8 C)  TempSrc: Oral Oral Oral Oral  SpO2: 93% 94% 95% 95%    Intake/Output Summary (Last 24 hours) at 11/10/2019 1257 Last data filed at 11/10/2019 1236 Gross per 24 hour  Intake 1785.97 ml  Output 2950 ml  Net -1164.03 ml   There were no vitals filed for this visit.  Examination:  General exam: Appears calm and comfortable  Respiratory system: Clear to auscultation. Respiratory effort normal. Cardiovascular system: S1 & S2 heard, RRR. No JVD, murmurs, rubs, gallops  or clicks. No pedal edema. Gastrointestinal system: Abdomen is nondistended, soft and nontender. No organomegaly or masses felt. Normal bowel sounds heard. Central nervous system: Alert and oriented. No focal neurological deficits. Extremities: Symmetric 5 x 5 power. Skin: No rashes, lesions or ulcers Psychiatry: Judgement and insight appear normal. Mood & affect appropriate.     Data Reviewed: I have personally reviewed following labs and imaging studies  CBC: Recent Labs  Lab 11/09/19 0925 11/09/19 1210 11/10/19 0428  WBC 3.9* 4.0 11.0*  NEUTROABS 2.0 2.2  --   HGB 6.0 Repeated and verified X2.* 5.7* 9.6*  HCT 19.8 Repeated and verified X2.* 21.0* 32.2*  MCV 71.9* 77.2* 80.3  PLT 272.0 272 970   Basic Metabolic Panel: Recent Labs    Lab 11/09/19 1210 11/10/19 0428  NA 129* 134*  K 4.1 4.3  CL 96* 99  CO2 23 26  GLUCOSE 130* 96  BUN 12 5*  CREATININE 0.53* 0.55*  CALCIUM 8.6* 8.9   GFR: Estimated Creatinine Clearance: 68.9 mL/min (A) (by C-G formula based on SCr of 0.55 mg/dL (L)). Liver Function Tests: Recent Labs  Lab 11/10/19 0428  AST 14*  ALT 13  ALKPHOS 79  BILITOT 0.9  PROT 5.8*  ALBUMIN 3.6   No results for input(s): LIPASE, AMYLASE in the last 168 hours. No results for input(s): AMMONIA in the last 168 hours. Coagulation Profile: Recent Labs  Lab 11/10/19 0428  INR 1.1   Cardiac Enzymes: No results for input(s): CKTOTAL, CKMB, CKMBINDEX, TROPONINI in the last 168 hours. BNP (last 3 results) No results for input(s): PROBNP in the last 8760 hours. HbA1C: No results for input(s): HGBA1C in the last 72 hours. CBG: No results for input(s): GLUCAP in the last 168 hours. Lipid Profile: No results for input(s): CHOL, HDL, LDLCALC, TRIG, CHOLHDL, LDLDIRECT in the last 72 hours. Thyroid Function Tests: No results for input(s): TSH, T4TOTAL, FREET4, T3FREE, THYROIDAB in the last 72 hours. Anemia Panel: No results for input(s): VITAMINB12, FOLATE, FERRITIN, TIBC, IRON, RETICCTPCT in the last 72 hours. Sepsis Labs: No results for input(s): PROCALCITON, LATICACIDVEN in the last 168 hours.  Recent Results (from the past 240 hour(s))  SARS Coronavirus 2 by RT PCR (hospital order, performed in [facility] hospital lab) Nasopharyngeal Nasopharyngeal Swab     Status: None   Collection Time: 11/09/19  5:18 PM   Specimen: Nasopharyngeal Swab  Result Value Ref Range Status   SARS Coronavirus 2 NEGATIVE NEGATIVE Final    Comment: (NOTE) SARS-CoV-2 target nucleic acids are NOT DETECTED.  The SARS-CoV-2 RNA is generally detectable in upper and lower respiratory specimens during the acute phase of infection. The lowest concentration of SARS-CoV-2 viral copies this assay can detect is 250 copies /  mL. A negative result does not preclude SARS-CoV-2 infection and should not be used as the sole basis for treatment or other patient management decisions.  A negative result may occur with improper specimen collection / handling, submission of specimen other than nasopharyngeal swab, presence of viral mutation(s) within the areas targeted by this assay, and inadequate number of viral copies (<250 copies / mL). A negative result must be combined with clinical observations, patient history, and epidemiological information.  Fact Sheet for Patients:   StrictlyIdeas.no  Fact Sheet for Healthcare Providers: BankingDealers.co.za  This test is not yet approved or  cleared by the [place] FDA and has been authorized for detection and/or diagnosis of SARS-CoV-2 by FDA under an Emergency Use Authorization (EUA).  This  EUA will remain in effect (meaning this test can be used) for the duration of the COVID-19 declaration under Section 564(b)(1) of the Act, 21 U.S.C. section 360bbb-3(b)(1), unless the authorization is terminated or revoked sooner.  Performed at [facility], [place] [address], [place],  [address]          Radiology Studies: No results found.      Scheduled Meds: . amLODipine  2.5 mg Oral Daily  . labetalol  10 mg Intravenous Once  . levothyroxine  75 mcg Oral QAC breakfast  . losartan  100 mg Oral q AM  . metoCLOPramide (REGLAN) injection  10 mg Intravenous Once   Followed by  . metoCLOPramide (REGLAN) injection  10 mg Intravenous Once  . pantoprazole (PROTONIX) IV  40 mg Intravenous Q12H  . peg 3350 powder  0.5 kit Oral Once   And  . peg 3350 powder  0.5 kit Oral Once  . tamsulosin  0.4 mg Oral Daily   Continuous Infusions: . sodium chloride 100 mL/hr at 11/10/19 0610     LOS: 1 day     Georgette Shell, MD  11/10/2019, 12:57 PM

## 2019-11-11 ENCOUNTER — Inpatient Hospital Stay (HOSPITAL_COMMUNITY): Payer: Medicare Other | Admitting: Anesthesiology

## 2019-11-11 ENCOUNTER — Encounter (HOSPITAL_COMMUNITY): Payer: Self-pay | Admitting: Internal Medicine

## 2019-11-11 ENCOUNTER — Encounter (HOSPITAL_COMMUNITY)
Admission: EM | Disposition: A | Discharge status: Home / Self Care | Payer: Self-pay | Source: Home / Self Care | Attending: Internal Medicine

## 2019-11-11 DIAGNOSIS — K635 Polyp of colon: Secondary | ICD-10-CM

## 2019-11-11 DIAGNOSIS — K209 Esophagitis, unspecified without bleeding: Secondary | ICD-10-CM

## 2019-11-11 DIAGNOSIS — K621 Rectal polyp: Secondary | ICD-10-CM

## 2019-11-11 DIAGNOSIS — K31819 Angiodysplasia of stomach and duodenum without bleeding: Secondary | ICD-10-CM

## 2019-11-11 DIAGNOSIS — D126 Benign neoplasm of colon, unspecified: Secondary | ICD-10-CM

## 2019-11-11 DIAGNOSIS — K31811 Angiodysplasia of stomach and duodenum with bleeding: Principal | ICD-10-CM

## 2019-11-11 HISTORY — PX: HEMOSTASIS CLIP PLACEMENT: SHX6857

## 2019-11-11 HISTORY — PX: HOT HEMOSTASIS: SHX5433

## 2019-11-11 HISTORY — PX: COLONOSCOPY WITH PROPOFOL: SHX5780

## 2019-11-11 HISTORY — PX: HEMOSTASIS CONTROL: SHX6838

## 2019-11-11 HISTORY — PX: BIOPSY: SHX5522

## 2019-11-11 HISTORY — PX: ESOPHAGOGASTRODUODENOSCOPY (EGD) WITH PROPOFOL: SHX5813

## 2019-11-11 HISTORY — PX: POLYPECTOMY: SHX5525

## 2019-11-11 LAB — CBC
HCT: 34.4 % — ABNORMAL LOW (ref 39.0–52.0)
HCT: 33.6 % — ABNORMAL LOW (ref 39.0–52.0)
Hemoglobin: 10.1 g/dL — ABNORMAL LOW (ref 13.0–17.0)
Hemoglobin: 9.9 g/dL — ABNORMAL LOW (ref 13.0–17.0)
MCH: 23.7 pg — ABNORMAL LOW (ref 26.0–34.0)
MCH: 24 pg — ABNORMAL LOW (ref 26.0–34.0)
MCHC: 29.4 g/dL — ABNORMAL LOW (ref 30.0–36.0)
MCHC: 29.5 g/dL — ABNORMAL LOW (ref 30.0–36.0)
MCV: 80.8 fL (ref 80.0–100.0)
MCV: 81.4 fL (ref 80.0–100.0)
Platelets: 249 10*3/uL (ref 150–400)
Platelets: 249 10*3/uL (ref 150–400)
RBC: 4.26 MIL/uL (ref 4.22–5.81)
RBC: 4.13 MIL/uL — ABNORMAL LOW (ref 4.22–5.81)
RDW: 19.7 % — ABNORMAL HIGH (ref 11.5–15.5)
RDW: 19.6 % — ABNORMAL HIGH (ref 11.5–15.5)
WBC: 4.3 10*3/uL (ref 4.0–10.5)
WBC: 6.7 10*3/uL (ref 4.0–10.5)
nRBC: 0 % (ref 0.0–0.2)
nRBC: 0 % (ref 0.0–0.2)

## 2019-11-11 LAB — GLUCOSE, CAPILLARY: Glucose-Capillary: 92 mg/dL (ref 70–99)

## 2019-11-11 SURGERY — ESOPHAGOGASTRODUODENOSCOPY (EGD) WITH PROPOFOL
Anesthesia: Monitor Anesthesia Care

## 2019-11-11 MED ORDER — LACTATED RINGERS IV SOLN: INTRAVENOUS | Status: DC | PRN

## 2019-11-11 MED ORDER — PANTOPRAZOLE SODIUM 40 MG IV SOLR: 40.0000 mg | INTRAVENOUS | Status: DC

## 2019-11-11 MED ORDER — PANTOPRAZOLE SODIUM 40 MG IV SOLR
40.0000 mg | INTRAVENOUS | Status: DC
  Administered 2019-11-11 – 2019-11-12 (×2): 40 mg via INTRAVENOUS
  Filled 2019-11-11 (×2): qty 40

## 2019-11-11 MED ORDER — SODIUM CHLORIDE (PF) 0.9 % IJ SOLN
PREFILLED_SYRINGE | INTRAMUSCULAR | Status: DC | PRN
  Administered 2019-11-11: 3.5 mL

## 2019-11-11 MED ORDER — LIDOCAINE 2% (20 MG/ML) 5 ML SYRINGE
INTRAMUSCULAR | Status: DC | PRN
  Administered 2019-11-11: 100 mg via INTRAVENOUS

## 2019-11-11 MED ORDER — EPINEPHRINE 1 MG/10ML IJ SOSY
PREFILLED_SYRINGE | INTRAMUSCULAR | Status: AC
  Filled 2019-11-11: qty 10

## 2019-11-11 MED ORDER — PHENYLEPHRINE 40 MCG/ML (10ML) SYRINGE FOR IV PUSH (FOR BLOOD PRESSURE SUPPORT)
PREFILLED_SYRINGE | INTRAVENOUS | Status: DC | PRN
  Administered 2019-11-11 (×3): 120 ug via INTRAVENOUS
  Administered 2019-11-11: 80 ug via INTRAVENOUS

## 2019-11-11 MED ORDER — PROPOFOL 500 MG/50ML IV EMUL
INTRAVENOUS | Status: DC | PRN
  Administered 2019-11-11: 100 ug/kg/min via INTRAVENOUS

## 2019-11-11 MED ORDER — PROPOFOL 10 MG/ML IV BOLUS
INTRAVENOUS | Status: DC | PRN
  Administered 2019-11-11: 40 mg via INTRAVENOUS
  Administered 2019-11-11: 20 mg via INTRAVENOUS
  Administered 2019-11-11: 30 mg via INTRAVENOUS
  Administered 2019-11-11: 10 mg via INTRAVENOUS

## 2019-11-11 SURGICAL SUPPLY — 25 items

## 2019-11-11 NOTE — Interval H&P Note (Signed)
History and Physical Interval Note:  11/11/2019 8:12 AM  Ronnie Snyder  has presented today for surgery, with the diagnosis of anemia, dark stools  FOBT +.  The various methods of treatment have been discussed with the patient and family. After consideration of risks, benefits and other options for treatment, the patient has consented to  Procedure(s): ESOPHAGOGASTRODUODENOSCOPY (EGD) WITH PROPOFOL (N/A) COLONOSCOPY WITH PROPOFOL (N/A) as a surgical intervention.  The patient's history has been reviewed, patient examined, no change in status, stable for surgery.  I have reviewed the patient's chart and labs.  Questions were answered to the patient's satisfaction.     [place]

## 2019-11-11 NOTE — Transfer of Care (Signed)
Immediate Anesthesia Transfer of Care Note  Patient: Ronnie Snyder  Procedure(s) Performed: ESOPHAGOGASTRODUODENOSCOPY (EGD) WITH PROPOFOL (N/A ) COLONOSCOPY WITH PROPOFOL (N/A ) HEMOSTASIS CLIP PLACEMENT HOT HEMOSTASIS (ARGON PLASMA COAGULATION/BICAP) (N/A ) POLYPECTOMY HEMOSTASIS CONTROL  Patient Location: PACU  Anesthesia Type:MAC  Level of Consciousness: awake and alert   Airway & Oxygen Therapy: Patient Spontanous Breathing and Patient connected to nasal cannula oxygen  Post-op Assessment: Report given to RN and Post -op Vital signs reviewed and stable  Post vital signs: Reviewed and stable  Last Vitals:  Vitals Value Taken Time  BP 141/45 11/11/19 0951  Temp    Pulse 63 11/11/19 0951  Resp 23 11/11/19 0952  SpO2 97 % 11/11/19 0951  Vitals shown include unvalidated device data.  Last Pain:  Vitals:   11/11/19 0951  TempSrc:   PainSc: 0-No pain         Complications: No complications documented.

## 2019-11-11 NOTE — Op Note (Signed)
[facility] Patient Name: Ronnie Snyder Procedure Date : 11/11/2019 MRN: [id number] Attending MD: Carlota Raspberry. Havery Moros , MD Date of Birth: [birth year] CSN: [id number] Age: 79 Admit Type: Inpatient Procedure:                Colonoscopy Indications:              Dark stools, Iron deficiency anemia - on Pletal -                            held for 2 days Providers:                Remo Lipps P. Havery Moros, MD, Baird Cancer, RN, Wynonia Sours, RN Referring MD:              Medicines:                Monitored Anesthesia Care Complications:            No immediate complications. Estimated blood loss:                            Minimal. Estimated Blood Loss:     Estimated blood loss was minimal. Procedure:                Pre-Anesthesia Assessment:                           - Prior to the procedure, a History and Physical                            was performed, and patient medications and                            allergies were reviewed. The patient's tolerance of                            previous anesthesia was also reviewed. The risks                            and benefits of the procedure and the sedation                            options and risks were discussed with the patient.                            All questions were answered, and informed consent                            was obtained. Prior Anticoagulants: The patient has                            taken Pletal (cilostazol), last dose was 2 days  prior to procedure. ASA Grade Assessment: III - A                            patient with severe systemic disease. After                            reviewing the risks and benefits, the patient was                            deemed in satisfactory condition to undergo the                            procedure.                           After obtaining informed consent, the colonoscope                             was passed under direct vision. Throughout the                            procedure, the patient's blood pressure, pulse, and                            oxygen saturations were monitored continuously. The                            PCF-H190DL ([id number]) Olympus pediatric colonscope                            was introduced through the anus and advanced to the                            the terminal ileum, with identification of the                            appendiceal orifice and IC valve. The colonoscopy                            was performed without difficulty. The patient                            tolerated the procedure well. The quality of the                            bowel preparation was fair. The terminal ileum,                            ileocecal valve, appendiceal orifice, and rectum                            were photographed. Scope In: 9:13:07 AM Scope Out: 9:40:20 AM Scope Withdrawal Time: 0 hours 17 minutes 48 seconds  Total Procedure Duration: 0 hours 27 minutes 13 seconds  Findings:      The perianal and digital rectal examinations were normal.      The terminal ileum appeared normal.      A 5 mm polyp was found in the ascending colon. The polyp was sessile.       The polyp was removed with a cold snare. Resection and retrieval were       complete.      A 6 mm polyp was found in the transverse colon. The polyp was sessile.       The polyp was removed with a cold snare. Resection and retrieval were       complete.      Five sessile polyps were found in the rectum. The polyps were 3 to 6 mm       in size. These polyps were removed with a cold snare. Resection and       retrieval were complete.      Many small and large-mouthed diverticula were found in the left colon.      Internal hemorrhoids were found during retroflexion.      The prep was fair in dependant portions of the colon. The right colon       took several minutes to lavage but eventually visualization  was adequate       for screening purposes. The exam was otherwise without abnormality. Impression:               - Preparation of the colon was fair but following                            lavage adequate views were obtained..                           - The examined portion of the ileum was normal.                           - One 5 mm polyp in the ascending colon, removed                            with a cold snare. Resected and retrieved.                           - One 6 mm polyp in the transverse colon, removed                            with a cold snare. Resected and retrieved.                           - Five 3 to 6 mm polyps in the rectum, removed with                            a cold snare. Resected and retrieved.                           - Diverticulosis in the left colon.                           - Internal hemorrhoids.                           -  The examination was otherwise normal.                           NO cause for anemia / symptoms on colonoscopy,                            likely due to findings on EGD Recommendation:           - Return patient to hospital ward for ongoing care.                           - Clear liquid diet per EGD note                           - Continue present medications.                           - Await pathology results.                           - Recommendations per EGD note Procedure Code(s):        --- Professional ---                           [phone], Colonoscopy, flexible; with removal of                            tumor(s), polyp(s), or other lesion(s) by snare                            technique Diagnosis Code(s):        --- Professional ---                           K64.8, Other hemorrhoids                           K63.5, Polyp of colon                           K62.1, Rectal polyp                           K92.1, Melena (includes Hematochezia)                           D50.9, Iron deficiency anemia, unspecified                            K57.30, Diverticulosis of large intestine without                            perforation or abscess without bleeding CPT copyright 2019 American Medical Association. All rights reserved. The codes documented in this report are preliminary and upon coder review may  be revised to meet current compliance requirements. Remo Lipps P. Kandra Graven, MD 11/11/2019 9:54:23 AM This report has been signed electronically. Number of Addenda: 0

## 2019-11-11 NOTE — Anesthesia Postprocedure Evaluation (Signed)
Anesthesia Post Note  Patient: Ronnie Snyder  Procedure(s) Performed: ESOPHAGOGASTRODUODENOSCOPY (EGD) WITH PROPOFOL (N/A ) COLONOSCOPY WITH PROPOFOL (N/A ) HEMOSTASIS CLIP PLACEMENT HOT HEMOSTASIS (ARGON PLASMA COAGULATION/BICAP) (N/A ) POLYPECTOMY HEMOSTASIS CONTROL BIOPSY     Patient location during evaluation: PACU Anesthesia Type: MAC Level of consciousness: awake and alert Pain management: pain level controlled Vital Signs Assessment: post-procedure vital signs reviewed and stable Respiratory status: spontaneous breathing, nonlabored ventilation, respiratory function stable and patient connected to nasal cannula oxygen Cardiovascular status: stable and blood pressure returned to baseline Postop Assessment: no apparent nausea or vomiting Anesthetic complications: no   No complications documented.  Last Vitals:  Vitals:   11/11/19 1001 11/11/19 1038  BP: (!) 149/64 (!) 187/77  Pulse: 64 63  Resp: 15 16  Temp:  36.6 C  SpO2: 92% 92%    Last Pain:  Vitals:   11/11/19 1038  TempSrc: Oral  PainSc: 0-No pain                 Tiajuana Amass

## 2019-11-11 NOTE — Op Note (Addendum)
[facility] Patient Name: Ronnie Snyder Procedure Date : 11/11/2019 MRN: [id number] Attending MD: Carlota Raspberry. Havery Moros , MD Date of Birth: 02-14-1941 CSN: [id number] Age: 79 Admit Type: Inpatient Procedure:                Upper GI endoscopy Indications:              Iron deficiency anemia, Melena - on Pletal - held                            for 2 days Providers:                Remo Lipps P. Havery Moros, MD, Baird Cancer, RN, Wynonia Sours, RN, Huck Dalton, Technician Referring MD:              Medicines:                Monitored Anesthesia Care Complications:            No immediate complications. Estimated blood loss:                            Minimal. Estimated Blood Loss:     Estimated blood loss was minimal. Procedure:                Pre-Anesthesia Assessment:                           - Prior to the procedure, a History and Physical                            was performed, and patient medications and                            allergies were reviewed. The patient's tolerance of                            previous anesthesia was also reviewed. The risks                            and benefits of the procedure and the sedation                            options and risks were discussed with the patient.                            All questions were answered, and informed consent                            was obtained. Prior Anticoagulants: The patient has                            taken Plavix (clopidogrel), last dose was 2 days  prior to procedure. ASA Grade Assessment: III - A                            patient with severe systemic disease. After                            reviewing the risks and benefits, the patient was                            deemed in satisfactory condition to undergo the                            procedure.                           After obtaining informed consent, the endoscope  was                            passed under direct vision. Throughout the                            procedure, the patient's blood pressure, pulse, and                            oxygen saturations were monitored continuously. The                            GIF-H190 ([id number]) Olympus gastroscope was                            introduced through the mouth, and advanced to the                            second part of duodenum. The upper GI endoscopy was                            accomplished without difficulty. The patient                            tolerated the procedure well. The GIF-H190                            ([id number]) Olympus gastroscope was introduced                            through the and advanced to the. Scope In: Scope Out: Findings:      LA Grade esophagitis was found at the GEJ.      The exam of the esophagus was otherwise normal.      Three 3 to 4 or 5 mm angiodysplastic lesions were found in the gastric       body, one with stigmata of recent bleeding with adherent heme (largest       lesion in proximal gastric body). Fulguration to ablate the 2 smaller       lesions to prevent bleeding  by argon plasma was successful. The largest       lesion was also treated with APC but had persistent oozing. The lesion       was successfully injected with 3 mL of a 1:10,000 solution of       epinephrine for drug delivery. For hemostasis, three hemostatic clips       were eventually placed across the site to achieve hemostasis (2       additional clips used in the process misfired within the endoscope       channel when the clip itself would not exit the endoscope channel, and       the endoscope had to be replaced). There was no bleeding at the end of       the procedure.      The exam of the stomach was otherwise normal.      Two 3 mm angiodysplastic lesions were found in the duodenal bulb.       Fulguration to ablate the lesions to prevent bleeding by argon plasma       was  successful.      Nodular polypoid mucosa was found in the duodenal bulb, grossly       consistent with benign ectopic gastric mucosa . Biopsies were taken with       a cold forceps for histology to ensure no adenomatous change.      The exam of the duodenum was otherwise normal. Impression:               - LA Grade A esophagitis.                           - Three angiodysplastic lesions in the stomach, one                            with stigmata of bleeding with adherent heme.                            Treated with argon plasma coagulation (APC).                            LArgest lesion also Injected with epi and Clips                            were placed.                           - Two angiodysplastic lesions in the duodenum.                            Treated with argon plasma coagulation (APC).                           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 Biopsied. Recommendation:           - Return patient to hospital ward for ongoing care.                           -  Clear liquid diet later today                           - Continue present medications.                           - Hold Pletal for now (would hold at least 1 week                            if acceptable) given largest AVM was difficult to                            treat as outlined                           - Await pathology results.                           - Protonix 40mg  once daily                           - Trend Hgb, monitor for recurrent bleeding /                            anemia. We will reassess the patient in the AM Procedure Code(s):        --- Professional ---                           86578, 59, Esophagogastroduodenoscopy, flexible,                            transoral; with control of bleeding, any method                           43236, 59, Esophagogastroduodenoscopy, flexible,                            transoral; with directed submucosal  injection(s),                            any substance                           43239, Esophagogastroduodenoscopy, flexible,                            transoral; with biopsy, single or multiple Diagnosis Code(s):        --- Professional ---                           K20.90, Esophagitis, unspecified without bleeding                           K31.811, Angiodysplasia of stomach and duodenum  with bleeding                           K31.89, Other diseases of stomach and duodenum                           D50.9, Iron deficiency anemia, unspecified                           K92.1, Melena (includes Hematochezia) CPT copyright 2019 American Medical Association. All rights reserved. The codes documented in this report are preliminary and upon coder review may  be revised to meet current compliance requirements. Remo Lipps P. Havery Moros, MD 11/11/2019 10:08:32 AM This report has been signed electronically. Number of Addenda: 0

## 2019-11-11 NOTE — Progress Notes (Signed)
PROGRESS NOTE    KEANU LESNIAK  RCV:[id number] DOB: Jun 17, 1940 DOA: 11/09/2019 PCP: Luetta Nutting, DO   Brief Narrative: Kyo Cocuzza Empie is a 79 y.o. male with medical history significant of iron deficiency anemia recent Covid, history of stroke sent in from GI office with low hemoglobin.  In the past he has been heme-negative.  In the ER he is heme positive today.  He is on Plavix at home.  He notices his stool to be dark in color.  He had loose stools today which is dark.  Denies any nausea vomiting.  No abdominal pain chest pain shortness of breath.  Is even walking.  He complains of generalized weakness and fatigue since last 2 months.  He has been on Protonix 40 mg twice a day.  Denies any fever chills or cough.  He denies chest pain shortness of breath.  ED Course: Hemoglobin 5.7 vital signs stable 3 units of blood transfusion ordered  Assessment & Plan:   Active Problems:   HTN (hypertension)   Hyponatremia   GI bleed   Iron deficiency anemia due to chronic blood loss   Benign neoplasm of colon   Gastric AVM    #1 GI bleed upper likely-admitted with hemoglobin 5.7 received 3 units of blood transfusion now hemoglobin up to 9.6.  EGD 11/11/2019 grade a esophagitis, 3 angiodysplastic lesions in the stomach 1 with stigmata of bleeding with adherent heme treated with APC, largest lesion also injected with epi and clips were placed.  2 angiodysplastic lesions in the duodenum treated with APC.  Suspected benign ectopic gastric mucosa in the duodenal bulb biopsied.  Colonoscopy-diverticulosis of the left colon and internal hemorrhoids.  No cause of anemia noted. GI recommends to continue clear liquid diet today, hold Pletal at least for a week given largest AVM.  Protonix 40 mg daily trend hemoglobin.  #2 iron deficiency anemia as above GI work-up  #3 hyponatremia resolved with normal saline  #4 history of stroke on Plavix at home  #5 history of essential  hypertension on Cozaar and Norvasc.  Blood pressure 155/76.  #6 BPH on Flomax  #7 hypothyroidism on Synthroid.    Estimated body mass index is 20.99 kg/m as calculated from the following:   Height as of an earlier encounter on 11/09/19: 5\' 7"  (1.702 m).   Weight as of this encounter: 60.8 kg.  DVT prophylaxis: SCD Code Status: Full code Family Communication: None at bedside Disposition Plan:  Status is: Inpatient  Dispo: The patient is from: Home              Anticipated d/c is to: Home              Anticipated d/c date is: 11/12/2019.              Patient currently is not medically stable to d/c.  Admitted with GI bleed status post EGD and colonoscopy need to monitor for further bleed only on clear liquids.    Consultants: GI   Procedures: None Antimicrobials: None  Subjective: Resting in bed feels better after blood transfusion no further bowel movements reported no nausea vomiting or abdominal pain.  Objective: Vitals:   11/11/19 0749 11/11/19 0951 11/11/19 1001 11/11/19 1038  BP: (!) 186/76 (!) 141/45 (!) 149/64 (!) 187/77  Pulse: 72 63 64 63  Resp: 18 20 15 16   Temp: 98 F (36.7 C) (!) 97 F (36.1 C)  97.9 F (36.6 C)  TempSrc: Oral Temporal  Oral  SpO2: 97% 97% 92% 92%  Weight:        Intake/Output Summary (Last 24 hours) at 11/11/2019 1501 Last data filed at 11/11/2019 0942 Gross per 24 hour  Intake 700 ml  Output 450 ml  Net 250 ml   Filed Weights   11/10/19 2047  Weight: 60.8 kg    Examination:  General exam: Appears calm and comfortable  Respiratory system: Clear to auscultation. Respiratory effort normal. Cardiovascular system: S1 & S2 heard, RRR. No JVD, murmurs, rubs, gallops or clicks. No pedal edema. Gastrointestinal system: Abdomen is nondistended, soft and nontender. No organomegaly or masses felt. Normal bowel sounds heard. Central nervous system: Alert and oriented. No focal neurological deficits. Extremities: Symmetric 5 x 5  power. Skin: No rashes, lesions or ulcers Psychiatry: Judgement and insight appear normal. Mood & affect appropriate.     Data Reviewed: I have personally reviewed following labs and imaging studies  CBC: Recent Labs  Lab 11/09/19 0925 11/09/19 1210 11/10/19 0428 11/11/19 0725  WBC 3.9* 4.0 11.0* 4.3  NEUTROABS 2.0 2.2  --   --   HGB 6.0 Repeated and verified X2.* 5.7* 9.6* 10.1*  HCT 19.8 Repeated and verified X2.* 21.0* 32.2* 34.4*  MCV 71.9* 77.2* 80.3 80.8  PLT 272.0 272 254 710   Basic Metabolic Panel: Recent Labs  Lab 11/09/19 1210 11/10/19 0428  NA 129* 134*  K 4.1 4.3  CL 96* 99  CO2 23 26  GLUCOSE 130* 96  BUN 12 5*  CREATININE 0.53* 0.55*  CALCIUM 8.6* 8.9   GFR: Estimated Creatinine Clearance: 65.4 mL/min (A) (by C-G formula based on SCr of 0.55 mg/dL (L)). Liver Function Tests: Recent Labs  Lab 11/10/19 0428  AST 14*  ALT 13  ALKPHOS 79  BILITOT 0.9  PROT 5.8*  ALBUMIN 3.6   No results for input(s): LIPASE, AMYLASE in the last 168 hours. No results for input(s): AMMONIA in the last 168 hours. Coagulation Profile: Recent Labs  Lab 11/10/19 0428  INR 1.1   Cardiac Enzymes: No results for input(s): CKTOTAL, CKMB, CKMBINDEX, TROPONINI in the last 168 hours. BNP (last 3 results) No results for input(s): PROBNP in the last 8760 hours. HbA1C: No results for input(s): HGBA1C in the last 72 hours. CBG: No results for input(s): GLUCAP in the last 168 hours. Lipid Profile: No results for input(s): CHOL, HDL, LDLCALC, TRIG, CHOLHDL, LDLDIRECT in the last 72 hours. Thyroid Function Tests: No results for input(s): TSH, T4TOTAL, FREET4, T3FREE, THYROIDAB in the last 72 hours. Anemia Panel: No results for input(s): VITAMINB12, FOLATE, FERRITIN, TIBC, IRON, RETICCTPCT in the last 72 hours. Sepsis Labs: No results for input(s): PROCALCITON, LATICACIDVEN in the last 168 hours.  Recent Results (from the past 240 hour(s))  SARS Coronavirus 2 by RT PCR  (hospital order, performed in [facility] hospital lab) Nasopharyngeal Nasopharyngeal Swab     Status: None   Collection Time: 11/09/19  5:18 PM   Specimen: Nasopharyngeal Swab  Result Value Ref Range Status   SARS Coronavirus 2 NEGATIVE NEGATIVE Final    Comment: (NOTE) SARS-CoV-2 target nucleic acids are NOT DETECTED.  The SARS-CoV-2 RNA is generally detectable in upper and lower respiratory specimens during the acute phase of infection. The lowest concentration of SARS-CoV-2 viral copies this assay can detect is 250 copies / mL. A negative result does not preclude SARS-CoV-2 infection and should not be used as the sole basis for treatment or other patient management decisions.  A negative result may occur with  improper specimen collection / handling, submission of specimen other than nasopharyngeal swab, presence of viral mutation(s) within the areas targeted by this assay, and inadequate number of viral copies (<250 copies / mL). A negative result must be combined with clinical observations, patient history, and epidemiological information.  Fact Sheet for Patients:   StrictlyIdeas.no  Fact Sheet for Healthcare Providers: BankingDealers.co.za  This test is not yet approved or  cleared by the [place] FDA and has been authorized for detection and/or diagnosis of SARS-CoV-2 by FDA under an Emergency Use Authorization (EUA).  This EUA will remain in effect (meaning this test can be used) for the duration of the COVID-19 declaration under Section 564(b)(1) of the Act, 21 U.S.C. section 360bbb-3(b)(1), unless the authorization is terminated or revoked sooner.  Performed at [facility], [place] [address], [place], [place] [address]          Radiology Studies: No results found.      Scheduled Meds: . amLODipine  2.5 mg Oral Daily  . labetalol  10 mg Intravenous Once  . levothyroxine  75 mcg Oral QAC breakfast  .  losartan  100 mg Oral q AM  . pantoprazole (PROTONIX) IV  40 mg Intravenous Q24H  . tamsulosin  0.4 mg Oral Daily   Continuous Infusions:    LOS: 2 days     Georgette Shell, MD  11/11/2019, 3:01 PM

## 2019-11-11 NOTE — Plan of Care (Signed)
  Problem: Activity: Goal: Risk for activity intolerance will decrease Outcome: Progressing   

## 2019-11-12 DIAGNOSIS — I10 Essential (primary) hypertension: Secondary | ICD-10-CM

## 2019-11-12 DIAGNOSIS — R195 Other fecal abnormalities: Secondary | ICD-10-CM

## 2019-11-12 DIAGNOSIS — K31819 Angiodysplasia of stomach and duodenum without bleeding: Secondary | ICD-10-CM

## 2019-11-12 LAB — SURGICAL PATHOLOGY

## 2019-11-12 LAB — CBC
HCT: 32.2 % — ABNORMAL LOW (ref 39.0–52.0)
Hemoglobin: 9.5 g/dL — ABNORMAL LOW (ref 13.0–17.0)
MCH: 23.7 pg — ABNORMAL LOW (ref 26.0–34.0)
MCHC: 29.5 g/dL — ABNORMAL LOW (ref 30.0–36.0)
MCV: 80.3 fL (ref 80.0–100.0)
Platelets: 226 10*3/uL (ref 150–400)
RBC: 4.01 MIL/uL — ABNORMAL LOW (ref 4.22–5.81)
RDW: 19.9 % — ABNORMAL HIGH (ref 11.5–15.5)
WBC: 6.4 10*3/uL (ref 4.0–10.5)
nRBC: 0 % (ref 0.0–0.2)

## 2019-11-12 MED ORDER — FERROUS SULFATE 325 (65 FE) MG PO TABS: 325.0000 mg | ORAL_TABLET | Freq: Every day | ORAL | 2 refills | Status: DC

## 2019-11-12 MED ORDER — PANTOPRAZOLE SODIUM 40 MG PO TBEC
40.0000 mg | DELAYED_RELEASE_TABLET | Freq: Every day | ORAL | 1 refills | Status: DC
Start: 2019-11-12 — End: 2020-01-05

## 2019-11-12 NOTE — Progress Notes (Addendum)
Progress Note   Subjective  Chief Complaint: Symptomatic anemia, black stools, FOBT positive  EGD and colonoscopy 11/11/2019.  EGD with LA grade a esophagitis, 3 angiodysplastic lesions in the stomach, one with stigmata of bleeding with adherent heme treated with APC and the largest lesion also injected with epi and clips were placed, 2 angiodysplastic lesions in the duodenum treated with APC, suspected benign ectopic gastric mucosa in the duodenal bulb biopsied-told to hold Pletal for at least a week, Protonix 40 daily.  Colonoscopy with fair preparation of the colon, 1 5 mm polyp in the ascending colon, 1 6 mm polyp in the transverse colon and 5 3-6 mm polyps in the rectum all removed, diverticulosis in the left colon and internal hemorrhoids.  Today, the patient tells me he has not had a bowel movement since Tuesday of this week, but also has not really eaten.  He is hungry and wanting a soft/bland diet.  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  Denies any complaints or concerns.   Objective   Vital signs in last 24 hours: Temp:  [97.6 F (36.4 C)-98.4 F (36.9 C)] 97.6 F (36.4 C) (06/25 0918) Pulse Rate:  [63-69] 64 (06/25 0918) Resp:  [15-18] 18 (06/25 0918) BP: (144-187)/(64-84) 184/78 (06/25 0918) SpO2:  [92 %-96 %] 96 % (06/25 0918) Last BM Date: 11/11/19 General:    white male in NAD Heart:  Regular rate and rhythm; no murmurs Lungs: Respirations even and unlabored, lungs CTA bilaterally Abdomen:  Soft, nontender and nondistended. Normal bowel sounds. Extremities:  Without edema. Neurologic:  Alert and oriented,  grossly normal neurologically. Psych:  Cooperative. Normal mood and affect.  Intake/Output from previous day: 06/24 0701 - 06/25 0700 In: 1920 [P.O.:1220; I.V.:700] Out: 0   Lab Results: Recent Labs    11/11/19 0725 11/11/19 1800  11/12/19 0441  WBC 4.3 6.7 6.4  HGB 10.1* 9.9* 9.5*  HCT 34.4* 33.6* 32.2*  PLT 249 249 226   BMET Recent Labs    11/09/19 1210 11/10/19 0428  NA 129* 134*  K 4.1 4.3  CL 96* 99  CO2 23 26  GLUCOSE 130* 96  BUN 12 5*  CREATININE 0.53* 0.55*  CALCIUM 8.6* 8.9   LFT Recent Labs    11/10/19 0428  PROT 5.8*  ALBUMIN 3.6  AST 14*  ALT 13  ALKPHOS 79  BILITOT 0.9   PT/INR Recent Labs    11/10/19 0428  LABPROT 13.5  INR 1.1     Assessment / Plan:   Assessment: 1.  Acute on chronic anemia with iron deficiency, symptomatic: EGD and colonoscopy 11/11/2019, EGD with angiodysplastic lesions which were treated with APC and clips thought to be the source of, hemoglobin now stable overnight 9.9--> 9.5 2.  Small CVA 07/2019: Patient on Pletal which is on hold, recommendations to hold this for another 6 days per Dr. Havery Moros 3.  Hyponatremia: Improving  Plan: 1.  Will increase patient to soft diet 2.  Continue to monitor hemoglobin with transfusion as needed less than 7 3.  Likely patient will be able to be discharged later today, would continue Protonix 40 mg daily and hold Pletal for another 6 days if thought reasonable. 4.  Please await any further recommendations from Dr. Henrene Pastor later this morning 5.  Chronic iron supplementation 6.  Outpatient GI follow-up to  be arranged  Thank you for your kind consultation.  We will sign off.    LOS: 3 days   Levin Erp  11/12/2019, 9:52 AM  GI ATTENDING  Interval history data reviewed.  Patient personally seen and examined.  Agree with comprehensive progress note as outlined above.  Patient is doing well.  No bowel movements.  Hemoglobin stable.  Results of endoscopy and colonoscopy reviewed with patient.  Angiodysplastic upper GI lesions treated as described.  The patient is stable for discharge.  He should take an iron supplement daily indefinitely with a history of AVMs.  He will be holding Pletal for a total of 1  week then resume.  We will arrange outpatient follow-up with his primary GI Dr. Fuller Plan or one of his advanced practitioners.  I did tell the patient that he if he has any interval problems to contact our office.  I have also discussed with the attending physician.  We will sign off.  Docia Chuck. Geri Seminole., M.D. [facility] Division of Gastroenterology

## 2019-11-12 NOTE — Progress Notes (Signed)
DISCHARGE NOTE HOME Ronnie Snyder to be discharged Home per MD order. Discussed prescriptions and follow up appointments with the patient. Prescriptions given to patient; medication list explained in detail. Patient verbalized understanding.  Skin clean, dry and intact without evidence of skin break down, no evidence of skin tears noted. IV catheter discontinued intact. Site without signs and symptoms of complications. Dressing and pressure applied. Pt denies pain at the site currently. No complaints noted.  Patient free of lines, drains, and wounds.   An After Visit Summary (AVS) was printed and given to the patient. Patient escorted via wheelchair, and discharged home via private auto.  [name], RN

## 2019-11-12 NOTE — Discharge Summary (Signed)
Physician Discharge Summary  Ronnie Snyder DGU:[id number] DOB: [birth year] DOA: 11/09/2019  PCP: Ronnie Nutting, DO  Admit date: 11/09/2019 Discharge date: 11/12/2019  Admitted From: Home Disposition: Home  Recommendations for Outpatient Follow-up:  1. Follow up with PCP in 1-2 weeks 2. Please obtain BMP/CBC in one week   Home Health: No Equipment/Devices: None  Discharge Condition: Stable CODE STATUS: Full Diet recommendation: Heart Healthy    Brief/Interim Summary: Ronnie Snyder is a 79 y.o. male with medical history significant of iron deficiency anemia recent Covid, history of stroke sent in from GI office with low hemoglobin.  In the past he has been heme-negative. He was seen in the ER on 11/09/2019 and was heme positive. He was supposed to be on Pletal and Plavix at home but patient reportedly not taking the Plavix. He had generalized weakness and fatigue since last 2 months.  He has been on Protonix 40 mg twice a day.  ED Course: Hemoglobin 5.7 vital signs stable. 3 units of blood transfusion ordered  Discharge Diagnoses:  Active Problems:   HTN (hypertension)   Hyponatremia   GI bleed   Iron deficiency anemia due to chronic blood loss   Benign neoplasm of colon   Gastric AVM  #1 GI bleed upper likely-admitted with hemoglobin 5.7 received 3 units of blood transfusion now hemoglobin up to 9.5.  EGD 11/11/2019 grade a esophagitis, 3 angiodysplastic lesions in the stomach 1 with stigmata of bleeding with adherent heme treated with APC, largest lesion also injected with epi and clips were placed.  2 angiodysplastic lesions in the duodenum treated with APC.  Suspected benign ectopic gastric mucosa in the duodenal bulb biopsied.  Colonoscopy-1 5 mm polyp in the ascending colon, 1 6 mm polyp in the transverse colon and 5 3-6 mm polyps in the rectum all removed, diverticulosis in the left colon and internal hemorrhoids. GI recommends to hold Pletal for 1 week and  then resume.  Protonix 40 mg daily. They also recommended for him to take an iron supplement daily, indefinitely with history of AVMs. -He was seen by GI and they are okay with him being discharged and follow-up outpatient.  #2 iron deficiency anemia: Per GI recommend iron supplement daily, indefinitely with history of AVMs.  #3 hyponatremia resolved with normal saline  #4 history of stroke: Patient supposed to be on Plavix but states that one of his outpatient doctors discontinued the Plavix but he does not remember why.  GI recommended to hold the Pletal for 1 week and then resume.  Advised to follow-up with his outpatient primary care physician and discuss whether he needs to be restarted on the Plavix and also if he needs to be on statins given the history of stroke.  #5 history of essential hypertension on Cozaar and Norvasc.  Blood pressure has been fluctuating.  This morning blood pressure was reported to be high but per nursing staff that was prior to him getting his morning blood pressure medications.  Blood pressure checked at the time of discharge is 134/56.  He wants to continue his home medications.  Advised the patient to follow-up with his primary care physician to have his blood pressure monitored in the outpatient setting and medications adjusted accordingly.  #6 BPH on Flomax  #7 hypothyroidism on Synthroid. TSH normal.   Discharge Instructions Discharge plan of care discussed with the patient.  Answered his questions appropriately to the best of my knowledge.  He verbalized understanding and is being discharged home in  a stable condition.  Allergies as of 11/12/2019      Reactions   Hydrocodone-acetaminophen Nausea Only   Vertigo, also   Atorvastatin Nausea Only   Other Nausea Only, Other (See Comments)   Vertigo and nausea: "ALL PAINS/NARCOTIC MEDS-CANNOT TOLERATE WELL,"  per Med History prior to 05/23/17   Penicillins Rash   Has patient had a PCN reaction causing  immediate rash, facial/tongue/throat swelling, SOB or lightheadedness with hypotension: Unknown Has patient had a PCN reaction causing severe rash involving mucus membranes or skin necrosis: No Has patient had a PCN reaction that required hospitalization: No Has patient had a PCN reaction occurring within the last 10 years: No If all of the above answers are "NO", then may proceed with Cephalosporin use.   Pioglitazone Nausea Only   Vicodin [hydrocodone-acetaminophen] Nausea Only      Medication List    STOP taking these medications   cilostazol 100 MG tablet Commonly known as: PLETAL   clopidogrel 75 MG tablet Commonly known as: PLAVIX   diclofenac Sodium 1 % Gel Commonly known as: Voltaren   Sutab [phone] MG Tabs Generic drug: Sodium Sulfate-Mag Sulfate-KCl     TAKE these medications   acetaminophen 500 MG tablet Commonly known as: TYLENOL Take 500 mg by mouth every 6 (six) hours as needed for mild pain or headache.   ALPRAZolam 0.25 MG tablet Commonly known as: XANAX TAKE 1 TABLET(0.25 MG) BY MOUTH TWICE DAILY AS NEEDED FOR ANXIETY What changed: See the new instructions.   amLODipine 2.5 MG tablet Commonly known as: NORVASC Take 1 tablet (2.5 mg total) by mouth daily.   bismuth subsalicylate 626 RS/85IO suspension Commonly known as: PEPTO BISMOL Take 30 mLs by mouth every 6 (six) hours as needed for diarrhea or loose stools.   ferrous sulfate 325 (65 FE) MG tablet Take 1 tablet (325 mg total) by mouth daily with breakfast.   lactulose 10 GM/15ML solution Commonly known as: CHRONULAC Take 15 mLs (10 g total) by mouth 2 (two) times daily as needed for mild constipation.   levothyroxine 75 MCG tablet Commonly known as: SYNTHROID TAKE 1 TABLET BY MOUTH EVERY DAY. PLEASE SCHEDULE VISIT WITH NEW PROVIDER What changed:   how much to take  how to take this  when to take this  additional instructions   losartan 100 MG tablet Commonly known as:  COZAAR Take 100 mg by mouth in the morning.   pantoprazole 40 MG tablet Commonly known as: Protonix Take 1 tablet (40 mg total) by mouth daily. What changed:   when to take this  additional instructions   tamsulosin 0.4 MG Caps capsule Commonly known as: FLOMAX Take 1 capsule (0.4 mg total) by mouth daily.       Allergies  Allergen Reactions  . Hydrocodone-Acetaminophen Nausea Only    Vertigo, also  . Atorvastatin Nausea Only  . Other Nausea Only and Other (See Comments)    Vertigo and nausea: "ALL PAINS/NARCOTIC MEDS-CANNOT TOLERATE WELL,"  per Med History prior to 05/23/17  . Penicillins Rash    Has patient had a PCN reaction causing immediate rash, facial/tongue/throat swelling, SOB or lightheadedness with hypotension: Unknown Has patient had a PCN reaction causing severe rash involving mucus membranes or skin necrosis: No Has patient had a PCN reaction that required hospitalization: No Has patient had a PCN reaction occurring within the last 10 years: No If all of the above answers are "NO", then may proceed with Cephalosporin use.   . Pioglitazone Nausea Only  .  Vicodin [Hydrocodone-Acetaminophen] Nausea Only    Consultations:  Gastroenterology   Procedures/Studies: EGD/colonoscopy with results as mentioned above   Subjective: He denies having any complaints at this time.  Discharge Exam: Vitals:   11/12/19 0918 11/12/19 1419  BP: (!) 184/78 (!) 134/56  Pulse: 64 66  Resp: 18   Temp: 97.6 F (36.4 C)   SpO2: 96%    Vitals:   11/11/19 2041 11/12/19 0502 11/12/19 0918 11/12/19 1419  BP: (!) 144/84 (!) 174/69 (!) 184/78 (!) 134/56  Pulse: 66 64 64 66  Resp: 18 18 18    Temp: 98.2 F (36.8 C) 98 F (36.7 C) 97.6 F (36.4 C)   TempSrc: Oral Oral Oral   SpO2: 95% 92% 96%   Weight:      Height:        General: Pt is alert, awake, not in acute distress Cardiovascular: RRR, S1/S2 +, no rubs, no gallops Respiratory: CTA bilaterally, no wheezing, no  rhonchi Abdominal: Soft, NT, ND, bowel sounds + Extremities: no edema, no cyanosis    The results of significant diagnostics from this hospitalization (including imaging, microbiology, ancillary and laboratory) are listed below for reference.     Microbiology: Recent Results (from the past 240 hour(s))  SARS Coronavirus 2 by RT PCR (hospital order, performed in [facility] hospital lab) Nasopharyngeal Nasopharyngeal Swab     Status: None   Collection Time: 11/09/19  5:18 PM   Specimen: Nasopharyngeal Swab  Result Value Ref Range Status   SARS Coronavirus 2 NEGATIVE NEGATIVE Final    Comment: (NOTE) SARS-CoV-2 target nucleic acids are NOT DETECTED.  The SARS-CoV-2 RNA is generally detectable in upper and lower respiratory specimens during the acute phase of infection. The lowest concentration of SARS-CoV-2 viral copies this assay can detect is 250 copies / mL. A negative result does not preclude SARS-CoV-2 infection and should not be used as the sole basis for treatment or other patient management decisions.  A negative result may occur with improper specimen collection / handling, submission of specimen other than nasopharyngeal swab, presence of viral mutation(s) within the areas targeted by this assay, and inadequate number of viral copies (<250 copies / mL). A negative result must be combined with clinical observations, patient history, and epidemiological information.  Fact Sheet for Patients:   StrictlyIdeas.no  Fact Sheet for Healthcare Providers: BankingDealers.co.za  This test is not yet approved or  cleared by the [place] FDA and has been authorized for detection and/or diagnosis of SARS-CoV-2 by FDA under an Emergency Use Authorization (EUA).  This EUA will remain in effect (meaning this test can be used) for the duration of the COVID-19 declaration under Section 564(b)(1) of the Act, 21 U.S.C. section  360bbb-3(b)(1), unless the authorization is terminated or revoked sooner.  Performed at [facility], [place] [address] [place], [place] [address]      Labs: BNP (last 3 results) Recent Labs    08/10/19 0627 08/14/19 0335 08/15/19 0251  BNP 827.2* 158.1* 202.5*   Basic Metabolic Panel: Recent Labs  Lab 11/09/19 1210 11/10/19 0428  NA 129* 134*  K 4.1 4.3  CL 96* 99  CO2 23 26  GLUCOSE 130* 96  BUN 12 5*  CREATININE 0.53* 0.55*  CALCIUM 8.6* 8.9   Liver Function Tests: Recent Labs  Lab 11/10/19 0428  AST 14*  ALT 13  ALKPHOS 79  BILITOT 0.9  PROT 5.8*  ALBUMIN 3.6   No results for input(s): LIPASE, AMYLASE in the  last 168 hours. No results for input(s): AMMONIA in the last 168 hours. CBC: Recent Labs  Lab 11/09/19 0925 11/09/19 0925 11/09/19 1210 11/10/19 0428 11/11/19 0725 11/11/19 1800 11/12/19 0441  WBC 3.9*   < > 4.0 11.0* 4.3 6.7 6.4  NEUTROABS 2.0  --  2.2  --   --   --   --   HGB 6.0 Repeated and verified X2.*   < > 5.7* 9.6* 10.1* 9.9* 9.5*  HCT 19.8 Repeated and verified X2.*   < > 21.0* 32.2* 34.4* 33.6* 32.2*  MCV 71.9*   < > 77.2* 80.3 80.8 81.4 80.3  PLT 272.0   < > 272 254 249 249 226   < > = values in this interval not displayed.   Cardiac Enzymes: No results for input(s): CKTOTAL, CKMB, CKMBINDEX, TROPONINI in the last 168 hours. BNP: Invalid input(s): POCBNP CBG: Recent Labs  Lab 11/11/19 1335  GLUCAP 92   D-Dimer No results for input(s): DDIMER in the last 72 hours. Hgb A1c No results for input(s): HGBA1C in the last 72 hours. Lipid Profile No results for input(s): CHOL, HDL, LDLCALC, TRIG, CHOLHDL, LDLDIRECT in the last 72 hours. Thyroid function studies No results for input(s): TSH, T4TOTAL, T3FREE, THYROIDAB in the last 72 hours.  Invalid input(s): FREET3 Anemia work up No results for input(s): VITAMINB12, FOLATE, FERRITIN, TIBC, IRON, RETICCTPCT in the last 72 hours. Urinalysis    Component Value Date/Time    COLORURINE YELLOW 10/02/2017 0824   APPEARANCEUR Cloudy (A) 10/02/2017 0824   LABSPEC 1.020 12/17/2018 0843   PHURINE 7.0 12/17/2018 0843   GLUCOSEU NEGATIVE 12/17/2018 0843   GLUCOSEU NEGATIVE 10/02/2017 0824   HGBUR NEGATIVE 12/17/2018 0843   HGBUR negative 11/13/2007 1003   BILIRUBINUR NEGATIVE 12/17/2018 0843   BILIRUBINUR NEGATIVE 10/26/2018 1027   KETONESUR NEGATIVE 12/17/2018 0843   PROTEINUR NEGATIVE 12/17/2018 0843   UROBILINOGEN 0.2 12/17/2018 0843   NITRITE NEGATIVE 12/17/2018 0843   LEUKOCYTESUR NEGATIVE 12/17/2018 0843   Sepsis Labs Invalid input(s): PROCALCITONIN,  WBC,  LACTICIDVEN Microbiology Recent Results (from the past 240 hour(s))  SARS Coronavirus 2 by RT PCR (hospital order, performed in [place] hospital lab) Nasopharyngeal Nasopharyngeal Swab     Status: None   Collection Time: 11/09/19  5:18 PM   Specimen: Nasopharyngeal Swab  Result Value Ref Range Status   SARS Coronavirus 2 NEGATIVE NEGATIVE Final    Comment: (NOTE) SARS-CoV-2 target nucleic acids are NOT DETECTED.  The SARS-CoV-2 RNA is generally detectable in upper and lower respiratory specimens during the acute phase of infection. The lowest concentration of SARS-CoV-2 viral copies this assay can detect is 250 copies / mL. A negative result does not preclude SARS-CoV-2 infection and should not be used as the sole basis for treatment or other patient management decisions.  A negative result may occur with improper specimen collection / handling, submission of specimen other than nasopharyngeal swab, presence of viral mutation(s) within the areas targeted by this assay, and inadequate number of viral copies (<250 copies / mL). A negative result must be combined with clinical observations, patient history, and epidemiological information.  Fact Sheet for Patients:   StrictlyIdeas.no  Fact Sheet for Healthcare  Providers: BankingDealers.co.za  This test is not yet approved or  cleared by the [place] FDA and has been authorized for detection and/or diagnosis of SARS-CoV-2 by FDA under an Emergency Use Authorization (EUA).  This EUA will remain in effect (meaning this test can be used) for the duration  of the COVID-19 declaration under Section 564(b)(1) of the Act, 21 U.S.C. section 360bbb-3(b)(1), unless the authorization is terminated or revoked sooner.  Performed at [facility], [place] [address], [place], [place] [address]      Time coordinating discharge: Over 30 minutes  SIGNED:   Yaakov Guthrie, MD  [facility] 11/12/2019, 2:51 PM Pager on amion  If 7PM-7AM, please contact night-coverage [email or web address] Password [email or web address]

## 2019-11-16 ENCOUNTER — Other Ambulatory Visit: Payer: Self-pay

## 2019-11-16 DIAGNOSIS — D509 Iron deficiency anemia, unspecified: Secondary | ICD-10-CM

## 2019-11-24 ENCOUNTER — Other Ambulatory Visit (INDEPENDENT_AMBULATORY_CARE_PROVIDER_SITE_OTHER): Payer: Medicare Other

## 2019-11-24 DIAGNOSIS — D509 Iron deficiency anemia, unspecified: Secondary | ICD-10-CM | POA: Diagnosis not present

## 2019-11-24 LAB — CBC
HCT: 34.6 % — ABNORMAL LOW (ref 39.0–52.0)
Hemoglobin: 10.7 g/dL — ABNORMAL LOW (ref 13.0–17.0)
MCHC: 30.8 g/dL (ref 30.0–36.0)
MCV: 79 fl (ref 78.0–100.0)
Platelets: 209 10*3/uL (ref 150.0–400.0)
RBC: 4.38 Mil/uL (ref 4.22–5.81)
RDW: 26.3 % — ABNORMAL HIGH (ref 11.5–15.5)
WBC: 7.1 10*3/uL (ref 4.0–10.5)

## 2019-11-25 ENCOUNTER — Other Ambulatory Visit: Payer: Self-pay

## 2019-11-25 DIAGNOSIS — D509 Iron deficiency anemia, unspecified: Secondary | ICD-10-CM

## 2019-11-30 ENCOUNTER — Ambulatory Visit: Payer: Medicare Other | Admitting: Gastroenterology

## 2019-12-13 ENCOUNTER — Other Ambulatory Visit (INDEPENDENT_AMBULATORY_CARE_PROVIDER_SITE_OTHER): Payer: Medicare Other

## 2019-12-13 ENCOUNTER — Telehealth: Payer: Self-pay | Admitting: Gastroenterology

## 2019-12-13 DIAGNOSIS — D509 Iron deficiency anemia, unspecified: Secondary | ICD-10-CM

## 2019-12-13 LAB — CBC
HCT: 35.8 % — ABNORMAL LOW (ref 39.0–52.0)
Hemoglobin: 11.5 g/dL — ABNORMAL LOW (ref 13.0–17.0)
MCHC: 32 g/dL (ref 30.0–36.0)
MCV: 80.3 fl (ref 78.0–100.0)
Platelets: 150 10*3/uL (ref 150.0–400.0)
RBC: 4.46 Mil/uL (ref 4.22–5.81)
RDW: 26.4 % — ABNORMAL HIGH (ref 11.5–15.5)
WBC: 7 10*3/uL (ref 4.0–10.5)

## 2019-12-13 NOTE — Telephone Encounter (Signed)
See results notes for details.  

## 2019-12-13 NOTE — Telephone Encounter (Signed)
Pt is requesting a call back from a nurse regarding his lab results. 

## 2019-12-17 ENCOUNTER — Encounter: Payer: Self-pay | Admitting: Gastroenterology

## 2020-01-04 ENCOUNTER — Other Ambulatory Visit: Payer: Self-pay

## 2020-01-04 ENCOUNTER — Encounter: Payer: Self-pay | Admitting: Family Medicine

## 2020-01-04 ENCOUNTER — Ambulatory Visit (INDEPENDENT_AMBULATORY_CARE_PROVIDER_SITE_OTHER): Payer: Medicare Other | Admitting: Family Medicine

## 2020-01-04 VITALS — BP 190/80 | HR 71 | Temp 98.3°F | Wt 136.1 lb

## 2020-01-04 DIAGNOSIS — I6330 Cerebral infarction due to thrombosis of unspecified cerebral artery: Secondary | ICD-10-CM

## 2020-01-04 DIAGNOSIS — E7800 Pure hypercholesterolemia, unspecified: Secondary | ICD-10-CM

## 2020-01-04 DIAGNOSIS — I10 Essential (primary) hypertension: Secondary | ICD-10-CM

## 2020-01-04 DIAGNOSIS — R209 Unspecified disturbances of skin sensation: Secondary | ICD-10-CM | POA: Diagnosis not present

## 2020-01-04 DIAGNOSIS — E039 Hypothyroidism, unspecified: Secondary | ICD-10-CM | POA: Diagnosis not present

## 2020-01-04 DIAGNOSIS — D500 Iron deficiency anemia secondary to blood loss (chronic): Secondary | ICD-10-CM | POA: Diagnosis not present

## 2020-01-04 DIAGNOSIS — K31819 Angiodysplasia of stomach and duodenum without bleeding: Secondary | ICD-10-CM | POA: Diagnosis not present

## 2020-01-04 LAB — LIPID PANEL
Cholesterol: 181 mg/dL (ref <200)
HDL: 44 mg/dL (ref >=40)
LDL Cholesterol (Calc): 108 mg/dL (calc) — ABNORMAL HIGH
Non-HDL Cholesterol (Calc): 137 mg/dL (calc) — ABNORMAL HIGH (ref <130)
Total CHOL/HDL Ratio: 4.1 (calc) (ref <5.0)
Triglycerides: 175 mg/dL — ABNORMAL HIGH (ref <150)

## 2020-01-04 LAB — COMPLETE METABOLIC PANEL WITH GFR
AG Ratio: 2 (calc) (ref 1.0–2.5)
ALT: 10 U/L (ref 9–46)
AST: 16 U/L (ref 10–35)
Albumin: 4.3 g/dL (ref 3.6–5.1)
Alkaline phosphatase (APISO): 85 U/L (ref 35–144)
BUN/Creatinine Ratio: 21 (calc) (ref 6–22)
BUN: 12 mg/dL (ref 7–25)
CO2: 30 mmol/L (ref 20–32)
Calcium: 9.3 mg/dL (ref 8.6–10.3)
Chloride: 94 mmol/L — ABNORMAL LOW (ref 98–110)
Creat: 0.57 mg/dL — ABNORMAL LOW (ref 0.70–1.18)
GFR, Est African American: 114 mL/min/{1.73_m2} (ref >=60)
GFR, Est Non African American: 98 mL/min/{1.73_m2} (ref >=60)
Globulin: 2.2 g/dL (calc) (ref 1.9–3.7)
Glucose, Bld: 100 mg/dL (ref 65–139)
Potassium: 4.6 mmol/L (ref 3.5–5.3)
Sodium: 133 mmol/L — ABNORMAL LOW (ref 135–146)
Total Bilirubin: 0.4 mg/dL (ref 0.2–1.2)
Total Protein: 6.5 g/dL (ref 6.1–8.1)

## 2020-01-04 LAB — CBC
HCT: 39.2 % (ref 38.5–50.0)
Hemoglobin: 12.5 g/dL — ABNORMAL LOW (ref 13.2–17.1)
MCH: 26.8 pg — ABNORMAL LOW (ref 27.0–33.0)
MCHC: 31.9 g/dL — ABNORMAL LOW (ref 32.0–36.0)
MCV: 83.9 fL (ref 80.0–100.0)
Platelets: 156 10*3/uL (ref 140–400)
RBC: 4.67 10*6/uL (ref 4.20–5.80)
RDW: 20.8 % — ABNORMAL HIGH (ref 11.0–15.0)
WBC: 6.8 10*3/uL (ref 3.8–10.8)

## 2020-01-04 LAB — TSH: TSH: 3.78 mIU/L (ref 0.40–4.50)

## 2020-01-04 LAB — B12 AND FOLATE PANEL
Folate: 23.6 ng/mL
Vitamin B-12: 352 pg/mL (ref 200–1100)

## 2020-01-04 MED ORDER — AMLODIPINE BESYLATE 5 MG PO TABS: 5.0000 mg | ORAL_TABLET | Freq: Every day | ORAL | 1 refills | Status: DC

## 2020-01-04 NOTE — Assessment & Plan Note (Addendum)
He has stopped plavix.  Will have him continue to hold with recent GI bleed.   Taking asa daily.  Continues on pletal for PAD.

## 2020-01-04 NOTE — Assessment & Plan Note (Addendum)
Recent GI bleed with anemia related to AVM.  Stable at this time.  Has additional f/u with GI.   Update CBC and renal function today Instructed to restart protonix 40mg  daily

## 2020-01-04 NOTE — Assessment & Plan Note (Signed)
Bp elevated today.  Start amlodipine 5mg  daily.  Will add losartan back on if renal function looks ok.

## 2020-01-04 NOTE — Progress Notes (Signed)
Ronnie Snyder - 79 y.o. male MRN [id number]  Date of birth: 08/13/1940  Subjective Chief Complaint  Patient presents with  . Hospitalization Follow-up    HPI Ronnie Snyder is a 79 y.o. male with history of HTN, PAD, previous CVA, hypothyroidism and HLD here today for hospital follow up. He was hospitalized from 6/22-6/25/21 for GI bleed.  Received 3 unit PRBC and iron infusions.  Had endoscopy as well showing gastric AVM.  He was noted to be hypotensive as well and losartan was held.  He had been on plavix for history of CVA and pletal for PAD.  He had stopped plavix prior to having GI bleed.  He has held BP medications since discharge.  H&H have trended up since then and he continues on iron supplement.  He denies any abdominal pain, nausea or dark stools.  He has been holding protonix as well, states he was told to do so in the hospital.    ROS:  A comprehensive ROS was completed and negative except as noted per HPI  Allergies  Allergen Reactions  . Hydrocodone-Acetaminophen Nausea Only    Vertigo, also  . Atorvastatin Nausea Only  . Other Nausea Only and Other (See Comments)    Vertigo and nausea: "ALL PAINS/NARCOTIC MEDS-CANNOT TOLERATE WELL,"  per Med History prior to 05/23/17  . Penicillins Rash    Has patient had a PCN reaction causing immediate rash, facial/tongue/throat swelling, SOB or lightheadedness with hypotension: Unknown Has patient had a PCN reaction causing severe rash involving mucus membranes or skin necrosis: No Has patient had a PCN reaction that required hospitalization: No Has patient had a PCN reaction occurring within the last 10 years: No If all of the above answers are "NO", then may proceed with Cephalosporin use.   . Pioglitazone Nausea Only  . Vicodin [Hydrocodone-Acetaminophen] Nausea Only    Past Medical History:  Diagnosis Date  . Abdominal aortic aneurysm (HCC)    3.5 cm 10/2016 L-spine CT (previously 3.0 cm by U/S 02/25/11)  . Anxiety    . Arthritis    "in my feet" (10/13/2012)  . GERD (gastroesophageal reflux disease)   . H/O hiatal hernia   . Hepatic steatosis   . High cholesterol    "at one time; it's fine now" (10/13/2012)  . HTN (hypertension)   . Hypothyroidism   . PAD (peripheral artery disease) ([place])   . Skin cancer    "burned them off my arm and such" (10/13/2012)  . Stroke ([place])   . Tubular adenoma of colon 10/2019    Past Surgical History:  Procedure Laterality Date  . ABDOMINAL ANGIOGRAM N/A 12/04/2011   Procedure: ABDOMINAL ANGIOGRAM;  Surgeon: Sherren Mocha, MD;  Location: [facility] CATH LAB;  Service: Cardiovascular;  Laterality: N/A;  . ABDOMINAL AORTAGRAM N/A 10/13/2012   Procedure: ABDOMINAL Maxcine Ham;  Surgeon: Serafina Mitchell, MD;  Location: [facility] CATH LAB;  Service: Cardiovascular;  Laterality: N/A;  . ANGIOPLASTY / STENTING FEMORAL Left 10/13/2012  . BIOPSY  11/11/2019   Procedure: BIOPSY;  Surgeon: Yetta Flock, MD;  Location: [place];  Service: Gastroenterology;;  . COLONOSCOPY WITH PROPOFOL N/A 11/11/2019   Procedure: COLONOSCOPY WITH PROPOFOL;  Surgeon: Yetta Flock, MD;  Location: [place];  Service: Gastroenterology;  Laterality: N/A;  . ESOPHAGOGASTRODUODENOSCOPY    . ESOPHAGOGASTRODUODENOSCOPY (EGD) WITH PROPOFOL N/A 11/11/2019   Procedure: ESOPHAGOGASTRODUODENOSCOPY (EGD) WITH PROPOFOL;  Surgeon: Yetta Flock, MD;  Location: [place];  Service: Gastroenterology;  Laterality: N/A;  . FEMORAL  ENDARTERECTOMY Left 01/23/12   Left Endarterectomy  with bovie patch Angioplasy  . gated spect wall motion stress cardiolite  02/10/2002  . HEMOSTASIS CLIP PLACEMENT  11/11/2019   Procedure: HEMOSTASIS CLIP PLACEMENT;  Surgeon: Yetta Flock, MD;  Location: [place];  Service: Gastroenterology;;  . HEMOSTASIS CONTROL  11/11/2019   Procedure: HEMOSTASIS CONTROL;  Surgeon: Yetta Flock, MD;  Location: [place];  Service: Gastroenterology;;  . HIP FRACTURE  SURGERY Right 1990's  . HOT HEMOSTASIS N/A 11/11/2019   Procedure: HOT HEMOSTASIS (ARGON PLASMA COAGULATION/BICAP);  Surgeon: Yetta Flock, MD;  Location: [facility] ENDOSCOPY;  Service: Gastroenterology;  Laterality: N/A;  . INGUINAL HERNIA REPAIR Right    "years ago" (10/13/2012)  . LUMBAR LAMINECTOMY/DECOMPRESSION MICRODISCECTOMY N/A 05/23/2017   Procedure: L4-5 DECOMPRESSION;  Surgeon: Marybelle Killings, MD;  Location: [place];  Service: Orthopedics;  Laterality: N/A;  . NOSE SURGERY    . POLYPECTOMY  11/11/2019   Procedure: POLYPECTOMY;  Surgeon: Yetta Flock, MD;  [facility] END[facility];  Service: Gastroenterology;;  . TONSILLECTOMY     "I was a chld" (10/13/2012)    Social History   Socioeconomic History  . Marital status: Married    Spouse name: Not on file  . Number of children: 2  . Years of education: 35  . Highest education level: Not on file  Occupational History  . Occupation: retired    Comment: optician  Tobacco Use  . Smoking status: Current Every Day Smoker    Packs/day: 0.25    Years: 57.00    Pack years: 14.25    Types: Cigarettes  . Smokeless tobacco: Never Used  . Tobacco comment: pt states there is no hope for quitting, pt states he smokes 1-2 cigarettes a day, wife has copd and doesnt smoke around her  Vaping Use  . Vaping Use: Never used  Substance and Sexual Activity  . Alcohol use: Not Currently  . Drug use: No  . Sexual activity: Not on file  Other Topics Concern  . Not on file  Social History Narrative   Lives with wife, is her caregiver   Social Determinants of Health   Financial Resource Strain:   . Difficulty of Paying Living Expenses:   Food Insecurity:   . Worried About Charity fundraiser in the Last Year:   . Arboriculturist in the Last Year:   Transportation Needs:   . Film/video editor (Medical):   Marland Kitchen Lack of Transportation (Non-Medical):   Physical Activity:   . Days of Exercise per Week:   . Minutes of Exercise per  Session:   Stress:   . Feeling of Stress :   Social Connections:   . Frequency of Communication with Friends and Family:   . Frequency of Social Gatherings with Friends and Family:   . Attends Religious Services:   . Active Member of Clubs or Organizations:   . Attends Archivist Meetings:   Marland Kitchen Marital Status:     Family History  Problem Relation Age of Onset  . Hypertension Mother   . Heart disease Mother   . Hypertension Father   . Heart disease Father   . Hypertension Sister   . Hypertension Sister   . Colon cancer Neg Hx   . Esophageal cancer Neg Hx   . Pancreatic cancer Neg Hx   . Stomach cancer Neg Hx     Health Maintenance  Topic Date Due  . INFLUENZA VACCINE  12/19/2019  .  TETANUS/TDAP  05/08/2027  . COVID-19 Vaccine  Completed  . Hepatitis C Screening  Completed  . PNA vac Low Risk Adult  Completed     ----------------------------------------------------------------------------------------------------------------------------------------------------------------------------------------------------------------- Physical Exam BP (!) 190/80 (BP Location: Left Arm, Patient Position: Sitting, Cuff Size: Normal)   Pulse 71   Temp 98.3 F (36.8 C) (Oral)   Wt 136 lb 1.9 oz (61.7 kg)   BMI 21.32 kg/m   Physical Exam Constitutional:      Appearance: Normal appearance.  HENT:     Head: Normocephalic and atraumatic.     Mouth/Throat:     Mouth: Mucous membranes are moist.  Eyes:     General: No scleral icterus. Cardiovascular:     Rate and Rhythm: Normal rate and regular rhythm.  Pulmonary:     Effort: Pulmonary effort is normal.     Breath sounds: Normal breath sounds.  Abdominal:     General: Abdomen is flat. There is no distension.     Palpations: Abdomen is soft.     Tenderness: There is no abdominal tenderness.  Neurological:     General: No focal deficit present.     Mental Status: He is alert.  Psychiatric:        Mood and Affect: Mood  normal.     ------------------------------------------------------------------------------------------------------------------------------------------------------------------------------------------------------------------- Assessment and Plan  Gastric AVM Recent GI bleed with anemia related to AVM.  Stable at this time.  Has additional f/u with GI.   Update CBC and renal function today Instructed to restart protonix 40mg  daily  HTN (hypertension) Bp elevated today.  Start amlodipine 5mg  daily.  Will add losartan back on if renal function looks ok.   Cerebral thrombosis with cerebral infarction He has stopped plavix.  Will have him continue to hold with recent GI bleed.   Taking asa daily.  Continues on pletal for PAD.    Iron deficiency anemia due to chronic blood loss Continue daily iron supplement. Update cbc.    Meds ordered this encounter  Medications  . amLODipine (NORVASC) 5 MG tablet    Sig: Take 1 tablet (5 mg total) by mouth daily.    Dispense:  90 tablet    Refill:  1    Return in about 6 weeks (around 02/15/2020) for HTN/anemia.    This visit occurred during the SARS-CoV-2 public health emergency.  Safety protocols were in place, including screening questions prior to the visit, additional usage of staff PPE, and extensive cleaning of exam room while observing appropriate contact time as indicated for disinfecting solutions.

## 2020-01-04 NOTE — Patient Instructions (Addendum)
Your blood pressure is elevated today.  Increase amlodipine to 5mg  daily.  If your kidney function is ok on your labs, we'll add losartan back on as well.  Take pantoprazole daily.  See me again in about 6 weeks.

## 2020-01-04 NOTE — Assessment & Plan Note (Signed)
Continue daily iron supplement. Update cbc.

## 2020-01-05 ENCOUNTER — Ambulatory Visit (INDEPENDENT_AMBULATORY_CARE_PROVIDER_SITE_OTHER): Payer: Medicare Other | Admitting: Gastroenterology

## 2020-01-05 ENCOUNTER — Encounter: Payer: Self-pay | Admitting: Gastroenterology

## 2020-01-05 VITALS — BP 138/80 | HR 80 | Ht 67.0 in | Wt 136.0 lb

## 2020-01-05 DIAGNOSIS — K2100 Gastro-esophageal reflux disease with esophagitis, without bleeding: Secondary | ICD-10-CM

## 2020-01-05 DIAGNOSIS — K31811 Angiodysplasia of stomach and duodenum with bleeding: Secondary | ICD-10-CM | POA: Diagnosis not present

## 2020-01-05 DIAGNOSIS — D500 Iron deficiency anemia secondary to blood loss (chronic): Secondary | ICD-10-CM

## 2020-01-05 DIAGNOSIS — I6330 Cerebral infarction due to thrombosis of unspecified cerebral artery: Secondary | ICD-10-CM | POA: Diagnosis not present

## 2020-01-05 MED ORDER — PANTOPRAZOLE SODIUM 40 MG PO TBEC: 40.0000 mg | DELAYED_RELEASE_TABLET | Freq: Every day | ORAL | 11 refills | Status: DC

## 2020-01-05 NOTE — Progress Notes (Signed)
    History of Present Illness: This is a 79 year old male returning following hospitalization for IDA, melena, FOBT positive stool. EGD with LA Grade A esophagitis, 3 gastric AVMs, 2 duodenal AVMs treated with APC, epi and clips. Colonoscopy with 6 tubular adenomatous polyps, left colon diverticulosis and internal hemorrhoids.  He relates his stools will remain dark on iron.  He has regular formed bowel movements.  No gastrointestinal complaints. Hgb drawn yesterday had increased to 12.5.  Current Medications, Allergies, Past Medical History, Past Surgical History, Family History and Social History were reviewed in [employer] record.  Physical Exam: General: Well developed, well nourished, no acute distress Head: Normocephalic and atraumatic Eyes:  sclerae anicteric, EOMI Ears: Normal auditory acuity Mouth: Not examined, mask on during Covid-19 pandemic Lungs: Clear throughout to auscultation Heart: Regular rate and rhythm; no murmurs, rubs or bruits Abdomen: Soft, non tender and non distended. No masses, hepatosplenomegaly or hernias noted. Normal Bowel sounds Rectal: Not done Musculoskeletal: Symmetrical with no gross deformities  Pulses:  Normal pulses noted Extremities: No clubbing, cyanosis, edema or deformities noted Neurological: Alert oriented x 4, grossly nonfocal Psychological:  Alert and cooperative. Normal mood and affect   Assessment and Recommendations:  1. IDA, chronic and acute GI bleeding.  Acute GI bleeding has resolved.  Maintain iron long-term compensate for anticipated chronic losses from AVMs.  Ongoing management of IDA with PCP.   2.  GERD with LA Class A esophagitis.  Maintain pantoprazole 40 mg p.o. every morning for long-term.   3.  Personal history of adenomatous colon polyps.  Given age and comorbidities no plans for future surveillance colonoscopies.

## 2020-01-05 NOTE — Patient Instructions (Signed)
We have sent the following medications to your pharmacy for you to pick up at your convenience: pantoprazole.   Stay on your iron supplement long term.   Thank you for choosing me and [facility].  Pricilla Riffle. Dagoberto Ligas., MD., Marval Regal

## 2020-01-12 DIAGNOSIS — D229 Melanocytic nevi, unspecified: Secondary | ICD-10-CM | POA: Diagnosis not present

## 2020-01-12 DIAGNOSIS — L819 Disorder of pigmentation, unspecified: Secondary | ICD-10-CM | POA: Diagnosis not present

## 2020-01-12 DIAGNOSIS — Z85828 Personal history of other malignant neoplasm of skin: Secondary | ICD-10-CM | POA: Diagnosis not present

## 2020-01-12 DIAGNOSIS — L821 Other seborrheic keratosis: Secondary | ICD-10-CM | POA: Diagnosis not present

## 2020-01-12 DIAGNOSIS — L570 Actinic keratosis: Secondary | ICD-10-CM | POA: Diagnosis not present

## 2020-01-12 DIAGNOSIS — L814 Other melanin hyperpigmentation: Secondary | ICD-10-CM | POA: Diagnosis not present

## 2020-01-12 DIAGNOSIS — L905 Scar conditions and fibrosis of skin: Secondary | ICD-10-CM | POA: Diagnosis not present

## 2020-01-12 DIAGNOSIS — C44212 Basal cell carcinoma of skin of right ear and external auricular canal: Secondary | ICD-10-CM | POA: Diagnosis not present

## 2020-01-12 DIAGNOSIS — D485 Neoplasm of uncertain behavior of skin: Secondary | ICD-10-CM | POA: Diagnosis not present

## 2020-01-17 ENCOUNTER — Ambulatory Visit: Payer: Medicare Other | Admitting: Adult Health

## 2020-01-29 ENCOUNTER — Other Ambulatory Visit: Payer: Self-pay | Admitting: Family Medicine

## 2020-02-10 ENCOUNTER — Other Ambulatory Visit: Payer: Self-pay

## 2020-02-10 DIAGNOSIS — D500 Iron deficiency anemia secondary to blood loss (chronic): Secondary | ICD-10-CM

## 2020-02-10 MED ORDER — FERROUS SULFATE 325 (65 FE) MG PO TABS: 325.0000 mg | ORAL_TABLET | Freq: Every day | ORAL | 2 refills | Status: DC

## 2020-02-17 ENCOUNTER — Other Ambulatory Visit: Payer: Self-pay | Admitting: Family Medicine

## 2020-02-18 ENCOUNTER — Other Ambulatory Visit: Payer: Self-pay

## 2020-02-18 ENCOUNTER — Ambulatory Visit (INDEPENDENT_AMBULATORY_CARE_PROVIDER_SITE_OTHER): Payer: Medicare Other | Admitting: Family Medicine

## 2020-02-18 ENCOUNTER — Encounter: Payer: Self-pay | Admitting: Family Medicine

## 2020-02-18 VITALS — BP 154/73 | HR 73 | Wt 137.0 lb

## 2020-02-18 DIAGNOSIS — Z23 Encounter for immunization: Secondary | ICD-10-CM

## 2020-02-18 DIAGNOSIS — K31819 Angiodysplasia of stomach and duodenum without bleeding: Secondary | ICD-10-CM

## 2020-02-18 DIAGNOSIS — F17200 Nicotine dependence, unspecified, uncomplicated: Secondary | ICD-10-CM | POA: Diagnosis not present

## 2020-02-18 DIAGNOSIS — I10 Essential (primary) hypertension: Secondary | ICD-10-CM

## 2020-02-18 DIAGNOSIS — D692 Other nonthrombocytopenic purpura: Secondary | ICD-10-CM

## 2020-02-18 MED ORDER — AMLODIPINE BESYLATE 10 MG PO TABS: 10.0000 mg | ORAL_TABLET | Freq: Every day | ORAL | 1 refills | Status: DC

## 2020-02-18 NOTE — Patient Instructions (Signed)
Increase amlodipine to 10mg  daily.  See me again in about 6 months.

## 2020-02-20 NOTE — Assessment & Plan Note (Signed)
BP remains elevated today.  Increase amlodipine to 10mg  daily.  Continue losartan 100mg  daily.

## 2020-02-20 NOTE — Assessment & Plan Note (Signed)
Counseled on smoking cessation.  Declines assistance with this at this time.

## 2020-02-20 NOTE — Progress Notes (Signed)
Ronnie Snyder - 79 y.o. male MRN [id number]  Date of birth: 1940/11/13  Subjective No chief complaint on file.   HPI Ronnie Snyder is a 79 y.o. male here today for follow up of HTN.  He has a history of HTN, PAD, previous stroke and HLD.  He had GI bleed in June.  He has done well since then,  He denies any dark stools or abdominal pain.  His H&H was stable on previous labs.    His BP is elevated today.  Amlodipine was reduced to 2.5mg  during previous hospitalization.  Increased to 5mg  at previous visit, which he did do.   He isn't sure what other medications he has at home but I reviewed each medication on his list and he is fairly confident he is taking what is on his list.  He has not had any symptoms including chest pain, shortness of breath, palpitations, headache or vision change.  He does unfortunately continue to smoke.  ROS:  A comprehensive ROS was completed and negative except as noted per HPI    Allergies  Allergen Reactions  . Hydrocodone-Acetaminophen Nausea Only    Vertigo, also  . Atorvastatin Nausea Only  . Other Nausea Only and Other (See Comments)    Vertigo and nausea: "ALL PAINS/NARCOTIC MEDS-CANNOT TOLERATE WELL,"  per Med History prior to 05/23/17  . Penicillins Rash    Has patient had a PCN reaction causing immediate rash, facial/tongue/throat swelling, SOB or lightheadedness with hypotension: Unknown Has patient had a PCN reaction causing severe rash involving mucus membranes or skin necrosis: No Has patient had a PCN reaction that required hospitalization: No Has patient had a PCN reaction occurring within the last 10 years: No If all of the above answers are "NO", then may proceed with Cephalosporin use.   . Pioglitazone Nausea Only  . Vicodin [Hydrocodone-Acetaminophen] Nausea Only    Past Medical History:  Diagnosis Date  . Abdominal aortic aneurysm (HCC)    3.5 cm 10/2016 L-spine CT (previously 3.0 cm by U/S 02/25/11)  . Anxiety   .  Arthritis    "in my feet" (10/13/2012)  . GERD (gastroesophageal reflux disease)   . H/O hiatal hernia   . Hepatic steatosis   . High cholesterol    "at one time; it's fine now" (10/13/2012)  . HTN (hypertension)   . Hypothyroidism   . PAD (peripheral artery disease) ([place])   . Skin cancer    "burned them off my arm and such" (10/13/2012)  . Stroke ([place])   . Tubular adenoma of colon 10/2019    Past Surgical History:  Procedure Laterality Date  . ABDOMINAL ANGIOGRAM N/A 12/04/2011   Procedure: ABDOMINAL ANGIOGRAM;  Surgeon: Sherren Mocha, MD;  Location: [facility] CATH LAB;  Service: Cardiovascular;  Laterality: N/A;  . ABDOMINAL AORTAGRAM N/A 10/13/2012   Procedure: ABDOMINAL Maxcine Ham;  Surgeon: Serafina Mitchell, MD;  Location: [facility] C[facility] LAB;  Service: Cardiovascular;  Laterality: N/A;  . ANGIOPLASTY / STENTING FEMORAL Left 10/13/2012  . BIOPSY  11/11/2019   Procedure: BIOPSY;  Surgeon: Yetta Flock, MD;  Location: [place];  Service: Gastroenterology;;  . COLONOSCOPY WITH PROPOFOL N/A 11/11/2019   Procedure: COLONOSCOPY WITH PROPOFOL;  Surgeon: Yetta Flock, MD;  Location: [place];  Service: Gastroenterology;  Laterality: N/A;  . ESOPHAGOGASTRODUODENOSCOPY    . ESOPHAGOGASTRODUODENOSCOPY (EGD) WITH PROPOFOL N/A 11/11/2019   Procedure: ESOPHAGOGASTRODUODENOSCOPY (EGD) WITH PROPOFOL;  Surgeon: Yetta Flock, MD;  Location: [place];  Service: Gastroenterology;  Laterality:  N/A;  . FEMORAL ENDARTERECTOMY Left 01/23/12   Left Endarterectomy  with bovie patch Angioplasy  . gated spect wall motion stress cardiolite  02/10/2002  . HEMOSTASIS CLIP PLACEMENT  11/11/2019   Procedure: HEMOSTASIS CLIP PLACEMENT;  Surgeon: Yetta Flock, MD;  Location: [place];  Service: Gastroenterology;;  . HEMOSTASIS CONTROL  11/11/2019   Procedure: HEMOSTASIS CONTROL;  Surgeon: Yetta Flock, MD;  Location: [place];  Service: Gastroenterology;;  . HIP FRACTURE SURGERY  Right 1990's  . HOT HEMOSTASIS N/A 11/11/2019   Procedure: HOT HEMOSTASIS (ARGON PLASMA COAGULATION/BICAP);  Surgeon: Yetta Flock, MD;  Location: [facility] ENDOSCOPY;  Service: Gastroenterology;  Laterality: N/A;  . INGUINAL HERNIA REPAIR Right    "years ago" (10/13/2012)  . LUMBAR LAMINECTOMY/DECOMPRESSION MICRODISCECTOMY N/A 05/23/2017   Procedure: L4-5 DECOMPRESSION;  Surgeon: Marybelle Killings, MD;  Location: [place];  Service: Orthopedics;  Laterality: N/A;  . NOSE SURGERY    . POLYPECTOMY  11/11/2019   Procedure: POLYPECTOMY;  Surgeon: Yetta Flock, MD;  Location: [facility] ENDOSCOPY;  Service: Gastroenterology;;  . TONSILLECTOMY     "I was a chld" (10/13/2012)    Social History   Socioeconomic History  . Marital status: Married    Spouse name: Not on file  . Number of children: 2  . Years of education: 28  . Highest education level: Not on file  Occupational History  . Occupation: retired    Comment: optician  Tobacco Use  . Smoking status: Current Every Day Smoker    Packs/day: 0.25    Years: 57.00    Pack years: 14.25    Types: Cigarettes  . Smokeless tobacco: Never Used  . Tobacco comment: pt states there is no hope for quitting, pt states he smokes 1-2 cigarettes a day, wife has copd and doesnt smoke around her  Vaping Use  . Vaping Use: Never used  Substance and Sexual Activity  . Alcohol use: Not Currently  . Drug use: No  . Sexual activity: Not on file  Other Topics Concern  . Not on file  Social History Narrative   Lives with wife, is her caregiver   Social Determinants of Health   Financial Resource Strain:   . Difficulty of Paying Living Expenses: Not on file  Food Insecurity:   . Worried About Charity fundraiser in the Last Year: Not on file  . Ran Out of Food in the Last Year: Not on file  Transportation Needs:   . Lack of Transportation (Medical): Not on file  . Lack of Transportation (Non-Medical): Not on file  Physical Activity:   . Days of  Exercise per Week: Not on file  . Minutes of Exercise per Session: Not on file  Stress:   . Feeling of Stress : Not on file  Social Connections:   . Frequency of Communication with Friends and Family: Not on file  . Frequency of Social Gatherings with Friends and Family: Not on file  . Attends Religious Services: Not on file  . Active Member of Clubs or Organizations: Not on file  . Attends Archivist Meetings: Not on file  . Marital Status: Not on file    Family History  Problem Relation Age of Onset  . Hypertension Mother   . Heart disease Mother   . Hypertension Father   . Heart disease Father   . Hypertension Sister   . Hypertension Sister   . Colon cancer Neg Hx   . Esophageal cancer Neg  Hx   . Pancreatic cancer Neg Hx   . Stomach cancer Neg Hx     Health Maintenance  Topic Date Due  . TETANUS/TDAP  05/08/2027  . INFLUENZA VACCINE  Completed  . COVID-19 Vaccine  Completed  . Hepatitis C Screening  Completed  . PNA vac Low Risk Adult  Completed     ----------------------------------------------------------------------------------------------------------------------------------------------------------------------------------------------------------------- Physical Exam BP (!) 154/73 (BP Location: Left Arm, Patient Position: Sitting)   Pulse 73   Wt 137 lb (62.1 kg)   SpO2 94%   BMI 21.46 kg/m   Physical Exam Constitutional:      Appearance: Normal appearance.  Eyes:     General: No scleral icterus. Cardiovascular:     Rate and Rhythm: Normal rate and regular rhythm.  Pulmonary:     Effort: Pulmonary effort is normal.     Breath sounds: Normal breath sounds.  Musculoskeletal:     Cervical back: Neck supple.  Skin:    General: Skin is warm and dry.  Neurological:     General: No focal deficit present.     Mental Status: He is alert.  Psychiatric:        Mood and Affect: Mood normal.        Behavior: Behavior normal.      ------------------------------------------------------------------------------------------------------------------------------------------------------------------------------------------------------------------- Assessment and Plan  HTN (hypertension) BP remains elevated today.  Increase amlodipine to 10mg  daily.  Continue losartan 100mg  daily.   Gastric AVM He is doing well since GI bleed earlier this year.  H&H stable on last set of labs and denies any further bleeding or symptoms of anemia    Smoker Counseled on smoking cessation.  Declines assistance with this at this time.    Senile purpura ([place]) Reassured, improved some since holding ASA.     Meds ordered this encounter  Medications  . amLODipine (NORVASC) 10 MG tablet    Sig: Take 1 tablet (10 mg total) by mouth daily.    Dispense:  90 tablet    Refill:  1    Return in about 6 months (around 08/18/2020) for HTN.    This visit occurred during the SARS-CoV-2 public health emergency.  Safety protocols were in place, including screening questions prior to the visit, additional usage of staff PPE, and extensive cleaning of exam room while observing appropriate contact time as indicated for disinfecting solutions.

## 2020-02-20 NOTE — Assessment & Plan Note (Signed)
Reassured, improved some since holding ASA.

## 2020-02-20 NOTE — Assessment & Plan Note (Signed)
He is doing well since GI bleed earlier this year.  H&H stable on last set of labs and denies any further bleeding or symptoms of anemia

## 2020-03-13 ENCOUNTER — Telehealth: Payer: Self-pay

## 2020-03-13 NOTE — Telephone Encounter (Signed)
Recommend tylenol arthritis or voltaren topical, both available OTC.  He has risk of repeat GI bleed with oral anti-inflammatories.

## 2020-03-13 NOTE — Telephone Encounter (Signed)
Ronnie Snyder is experience arthritis pain in both hands and feet.   He would like a medication called in for him. Or he is willing to schedule an appt. He states his arthritis was discussed at his last visit.

## 2020-03-14 NOTE — Telephone Encounter (Signed)
Hemoglobin improving but we can recheck CBC again in 8-10 weeks.  Dark stool may be coming from iron supplement.

## 2020-03-14 NOTE — Telephone Encounter (Signed)
Pt states he was using a topical cream but it was not Voltaren. Advised pt to purchase voltaren and Tylenol arthritis.  He would like lab tests more regularly than 6 months. States his stool is still very dark and he's not sure why.

## 2020-03-15 DIAGNOSIS — Z85828 Personal history of other malignant neoplasm of skin: Secondary | ICD-10-CM | POA: Diagnosis not present

## 2020-03-15 DIAGNOSIS — L819 Disorder of pigmentation, unspecified: Secondary | ICD-10-CM | POA: Diagnosis not present

## 2020-03-15 DIAGNOSIS — L570 Actinic keratosis: Secondary | ICD-10-CM | POA: Diagnosis not present

## 2020-03-15 DIAGNOSIS — L905 Scar conditions and fibrosis of skin: Secondary | ICD-10-CM | POA: Diagnosis not present

## 2020-03-15 NOTE — Telephone Encounter (Signed)
Pt has been advised of lab results and information. Pt agrees and expresses understanding.

## 2020-03-18 ENCOUNTER — Ambulatory Visit: Payer: Self-pay | Attending: Internal Medicine

## 2020-03-18 DIAGNOSIS — Z23 Encounter for immunization: Secondary | ICD-10-CM

## 2020-03-18 NOTE — Progress Notes (Signed)
   Covid-19 Vaccination Clinic  Name:  Ronnie Snyder    MRN: [id number] DOB: 06/08/1940  03/18/2020  [name] was observed post Covid-19 immunization for 15 minutes without incident. He was provided with Vaccine Information Sheet and instruction to access the V-Safe system.   [name] was instructed to call 911 with any severe reactions post vaccine: Marland Kitchen Difficulty breathing  . Swelling of face and throat  . A fast heartbeat  . A bad rash all over body  . Dizziness and weakness

## 2020-03-21 DIAGNOSIS — M19042 Primary osteoarthritis, left hand: Secondary | ICD-10-CM | POA: Diagnosis not present

## 2020-03-21 DIAGNOSIS — M19041 Primary osteoarthritis, right hand: Secondary | ICD-10-CM | POA: Diagnosis not present

## 2020-04-25 DIAGNOSIS — M19042 Primary osteoarthritis, left hand: Secondary | ICD-10-CM | POA: Diagnosis not present

## 2020-04-25 DIAGNOSIS — M19041 Primary osteoarthritis, right hand: Secondary | ICD-10-CM | POA: Diagnosis not present

## 2020-05-03 DIAGNOSIS — M19042 Primary osteoarthritis, left hand: Secondary | ICD-10-CM | POA: Diagnosis not present

## 2020-05-03 DIAGNOSIS — M19041 Primary osteoarthritis, right hand: Secondary | ICD-10-CM | POA: Diagnosis not present

## 2020-05-05 ENCOUNTER — Encounter: Payer: Self-pay | Admitting: Family Medicine

## 2020-05-25 ENCOUNTER — Encounter: Payer: Self-pay | Admitting: Family Medicine

## 2020-05-30 ENCOUNTER — Ambulatory Visit (INDEPENDENT_AMBULATORY_CARE_PROVIDER_SITE_OTHER): Payer: Medicare Other | Admitting: Family Medicine

## 2020-05-30 ENCOUNTER — Other Ambulatory Visit: Payer: Self-pay

## 2020-05-30 ENCOUNTER — Encounter: Payer: Self-pay | Admitting: Family Medicine

## 2020-05-30 VITALS — BP 135/67 | HR 75 | Temp 97.7°F | Wt 134.1 lb

## 2020-05-30 DIAGNOSIS — I10 Essential (primary) hypertension: Secondary | ICD-10-CM

## 2020-05-30 DIAGNOSIS — D500 Iron deficiency anemia secondary to blood loss (chronic): Secondary | ICD-10-CM

## 2020-05-30 DIAGNOSIS — R5383 Other fatigue: Secondary | ICD-10-CM

## 2020-05-30 LAB — COMPLETE METABOLIC PANEL WITH GFR
AG Ratio: 1.6 (calc) (ref 1.0–2.5)
ALT: 10 U/L (ref 9–46)
AST: 13 U/L (ref 10–35)
Albumin: 4 g/dL (ref 3.6–5.1)
Alkaline phosphatase (APISO): 92 U/L (ref 35–144)
BUN/Creatinine Ratio: 24 (calc) — ABNORMAL HIGH (ref 6–22)
BUN: 13 mg/dL (ref 7–25)
CO2: 28 mmol/L (ref 20–32)
Calcium: 9.2 mg/dL (ref 8.6–10.3)
Chloride: 93 mmol/L — ABNORMAL LOW (ref 98–110)
Creat: 0.55 mg/dL — ABNORMAL LOW (ref 0.70–1.18)
GFR, Est African American: 116 mL/min/{1.73_m2} (ref >=60)
GFR, Est Non African American: 100 mL/min/{1.73_m2} (ref >=60)
Globulin: 2.5 g/dL (calc) (ref 1.9–3.7)
Glucose, Bld: 84 mg/dL (ref 65–139)
Potassium: 4.4 mmol/L (ref 3.5–5.3)
Sodium: 129 mmol/L — ABNORMAL LOW (ref 135–146)
Total Bilirubin: 0.4 mg/dL (ref 0.2–1.2)
Total Protein: 6.5 g/dL (ref 6.1–8.1)

## 2020-05-30 LAB — CBC
HCT: 34.7 % — ABNORMAL LOW (ref 38.5–50.0)
Hemoglobin: 12 g/dL — ABNORMAL LOW (ref 13.2–17.1)
MCH: 31.1 pg (ref 27.0–33.0)
MCHC: 34.6 g/dL (ref 32.0–36.0)
MCV: 89.9 fL (ref 80.0–100.0)
MPV: 11.5 fL (ref 7.5–12.5)
Platelets: 275 10*3/uL (ref 140–400)
RBC: 3.86 10*6/uL — ABNORMAL LOW (ref 4.20–5.80)
RDW: 12.7 % (ref 11.0–15.0)
WBC: 9.3 10*3/uL (ref 3.8–10.8)

## 2020-05-30 LAB — IRON,TIBC AND FERRITIN PANEL
%SAT: 13 % (calc) — ABNORMAL LOW (ref 20–48)
Ferritin: 231 ng/mL (ref 24–380)
Iron: 34 ug/dL — ABNORMAL LOW (ref 50–180)
TIBC: 271 mcg/dL (calc) (ref 250–425)

## 2020-05-30 NOTE — Assessment & Plan Note (Signed)
Increased fatigue may due to poor sleep from pain but with history of GI bleed will check stat CBC and CMP.  Vitals stable.  No anginal symptoms or dizziness.  Update iron panel as well today.

## 2020-05-30 NOTE — Progress Notes (Signed)
Ronnie Snyder - 80 y.o. male MRN [id number]  Date of birth: [birth year]  Subjective No chief complaint on file.   HPI Ronnie Snyder is  80 y.o. male here today with complaint of fatigue.  Increased fatigue over the past couple of weeks.  He has history of recurrent GI bleeds and feels similar to when his hemoglobin as dropped in the past.  He denies dizziness, chest pain or dyspnea.  His stools are dark but this chronic.  He is taking iron supplement.   He also reports he is having hand pain from arthritis and hasn't slept well in several days.  He is seeing ortho tomorrow for this.    ROS:  A comprehensive ROS was completed and negative except as noted per HPI   Allergies  Allergen Reactions  . Hydrocodone-Acetaminophen Nausea Only    Vertigo, also  . Atorvastatin Nausea Only  . Other Nausea Only and Other (See Comments)    Vertigo and nausea: "ALL PAINS/NARCOTIC MEDS-CANNOT TOLERATE WELL,"  per Med History prior to 05/23/17  . Penicillins Rash    Has patient had a PCN reaction causing immediate rash, facial/tongue/throat swelling, SOB or lightheadedness with hypotension: Unknown Has patient had a PCN reaction causing severe rash involving mucus membranes or skin necrosis: No Has patient had a PCN reaction that required hospitalization: No Has patient had a PCN reaction occurring within the last 10 years: No If all of the above answers are "NO", then may proceed with Cephalosporin use.   . Pioglitazone Nausea Only  . Vicodin [Hydrocodone-Acetaminophen] Nausea Only    Past Medical History:  Diagnosis Date  . Abdominal aortic aneurysm (HCC)    3.5 cm 10/2016 L-spine CT (previously 3.0 cm by U/S 02/25/11)  . Anxiety   . Arthritis    "in my feet" (10/13/2012)  . GERD (gastroesophageal reflux disease)   . H/O hiatal hernia   . Hepatic steatosis   . High cholesterol    "at one time; it's fine now" (10/13/2012)  . HTN (hypertension)   . Hypothyroidism   . PAD (peripheral  artery disease) ([place])   . Skin cancer    "burned them off my arm and such" (10/13/2012)  . Stroke ([place])   . Tubular adenoma of colon 10/2019    Past Surgical History:  Procedure Laterality Date  . ABDOMINAL ANGIOGRAM N/A 12/04/2011   Procedure: ABDOMINAL ANGIOGRAM;  Surgeon: Sherren Mocha, MD;  Location: [facility] CATH LAB;  Service: Cardiovascular;  Laterality: N/A;  . ABDOMINAL AORTAGRAM N/A 10/13/2012   Procedure: ABDOMINAL Maxcine Ham;  Surgeon: Serafina Mitchell, MD;  Location: [facility] CATH LAB;  Service: Cardiovascular;  Laterality: N/A;  . ANGIOPLASTY / STENTING FEMORAL Left 10/13/2012  . BIOPSY  11/11/2019   Procedure: BIOPSY;  Surgeon: Yetta Flock, MD;  Location: [place];  Service: Gastroenterology;;  . COLONOSCOPY WITH PROPOFOL N/A 11/11/2019   Procedure: COLONOSCOPY WITH PROPOFOL;  Surgeon: Yetta Flock, MD;  Location: [place];  Service: Gastroenterology;  Laterality: N/A;  . ESOPHAGOGASTRODUODENOSCOPY    . ESOPHAGOGASTRODUODENOSCOPY (EGD) WITH PROPOFOL N/A 11/11/2019   Procedure: ESOPHAGOGASTRODUODENOSCOPY (EGD) WITH PROPOFOL;  Surgeon: Yetta Flock, MD;  Location: [place];  Service: Gastroenterology;  Laterality: N/A;  . FEMORAL ENDARTERECTOMY Left 01/23/12   Left Endarterectomy  with bovie patch Angioplasy  . gated spect wall motion stress cardiolite  02/10/2002  . HEMOSTASIS CLIP PLACEMENT  11/11/2019   Procedure: HEMOSTASIS CLIP PLACEMENT;  Surgeon: Yetta Flock, MD;  Location: [place];  Service:  Gastroenterology;;  . HEMOSTASIS CONTROL  11/11/2019   Procedure: HEMOSTASIS CONTROL;  Surgeon: Yetta Flock, MD;  Location: [facility] ENDOSCOPY;  Service: Gastroenterology;;  . HIP FRACTURE SURGERY Right 1990's  . HOT HEMOSTASIS N/A 11/11/2019   Procedure: HOT HEMOSTASIS (ARGON PLASMA COAGULATION/BICAP);  Surgeon: Yetta Flock, MD;  Location: [facility] ENDOSCOPY;  Service: Gastroenterology;  Laterality: N/A;  . INGUINAL HERNIA REPAIR Right    "years  ago" (10/13/2012)  . LUMBAR LAMINECTOMY/DECOMPRESSION MICRODISCECTOMY N/A 05/23/2017   Procedure: L4-5 DECOMPRESSION;  Surgeon: Marybelle Killings, MD;  Location: [place];  Service: Orthopedics;  Laterality: N/A;  . NOSE SURGERY    . POLYPECTOMY  11/11/2019   Procedure: POLYPECTOMY;  Surgeon: Yetta Flock, MD;  Location: [facility] ENDOSCOPY;  Service: Gastroenterology;;  . TONSILLECTOMY     "I was a chld" (10/13/2012)    Social History   Socioeconomic History  . Marital status: Married    Spouse name: Not on file  . Number of children: 2  . Years of education: 39  . Highest education level: Not on file  Occupational History  . Occupation: retired    Comment: optician  Tobacco Use  . Smoking status: Current Every Day Smoker    Packs/day: 0.25    Years: 57.00    Pack years: 14.25    Types: Cigarettes  . Smokeless tobacco: Never Used  . Tobacco comment: pt states there is no hope for quitting, pt states he smokes 1-2 cigarettes a day, wife has copd and doesnt smoke around her  Vaping Use  . Vaping Use: Never used  Substance and Sexual Activity  . Alcohol use: Not Currently  . Drug use: No  . Sexual activity: Not on file  Other Topics Concern  . Not on file  Social History Narrative   ** Merged History Encounter **       Lives with wife, is her caregiver   Social Determinants of Radio broadcast assistant Strain: Not on file  Food Insecurity: Not on file  Transportation Needs: Not on file  Physical Activity: Not on file  Stress: Not on file  Social Connections: Not on file    Family History  Problem Relation Age of Onset  . Hypertension Mother   . Heart disease Mother   . Hypertension Father   . Heart disease Father   . Hypertension Sister   . Hypertension Sister   . Colon cancer Neg Hx   . Esophageal cancer Neg Hx   . Pancreatic cancer Neg Hx   . Stomach cancer Neg Hx     Health Maintenance  Topic Date Due  . TETANUS/TDAP  05/08/2027  . INFLUENZA VACCINE   Completed  . COVID-19 Vaccine  Completed  . Hepatitis C Screening  Completed  . PNA vac Low Risk Adult  Completed     ----------------------------------------------------------------------------------------------------------------------------------------------------------------------------------------------------------------- Physical Exam BP 135/67 (BP Location: Left Arm, Patient Position: Sitting, Cuff Size: Normal)   Pulse 75   Temp 97.7 F (36.5 C)   Wt 134 lb 1.6 oz (60.8 kg)   SpO2 99%   BMI 21.00 kg/m   Physical Exam Constitutional:      Appearance: Normal appearance.  HENT:     Head: Normocephalic and atraumatic.  Eyes:     General: No scleral icterus. Cardiovascular:     Rate and Rhythm: Normal rate and regular rhythm.  Pulmonary:     Effort: Pulmonary effort is normal.     Breath sounds: Normal breath sounds.  Neurological:  General: No focal deficit present.     Mental Status: He is alert.  Psychiatric:        Mood and Affect: Mood normal.        Behavior: Behavior normal.     ------------------------------------------------------------------------------------------------------------------------------------------------------------------------------------------------------------------- Assessment and Plan  Fatigue Increased fatigue may due to poor sleep from pain but with history of GI bleed will check stat CBC and CMP.  Vitals stable.  No anginal symptoms or dizziness.  Update iron panel as well today.     No orders of the defined types were placed in this encounter.   No follow-ups on file.    This visit occurred during the SARS-CoV-2 public health emergency.  Safety protocols were in place, including screening questions prior to the visit, additional usage of staff PPE, and extensive cleaning of exam room while observing appropriate contact time as indicated for disinfecting solutions.

## 2020-05-30 NOTE — Patient Instructions (Addendum)
Have labs completed, we'll be in touch with results.

## 2020-05-31 DIAGNOSIS — R5382 Chronic fatigue, unspecified: Secondary | ICD-10-CM | POA: Diagnosis not present

## 2020-05-31 DIAGNOSIS — Z6820 Body mass index (BMI) 20.0-20.9, adult: Secondary | ICD-10-CM | POA: Diagnosis not present

## 2020-05-31 DIAGNOSIS — M2550 Pain in unspecified joint: Secondary | ICD-10-CM | POA: Diagnosis not present

## 2020-06-14 DIAGNOSIS — R5382 Chronic fatigue, unspecified: Secondary | ICD-10-CM | POA: Diagnosis not present

## 2020-06-14 DIAGNOSIS — M0579 Rheumatoid arthritis with rheumatoid factor of multiple sites without organ or systems involvement: Secondary | ICD-10-CM | POA: Diagnosis not present

## 2020-06-14 DIAGNOSIS — Z6820 Body mass index (BMI) 20.0-20.9, adult: Secondary | ICD-10-CM | POA: Diagnosis not present

## 2020-06-14 DIAGNOSIS — M2550 Pain in unspecified joint: Secondary | ICD-10-CM | POA: Diagnosis not present

## 2020-06-24 ENCOUNTER — Other Ambulatory Visit: Payer: Self-pay | Admitting: Family Medicine

## 2020-07-23 ENCOUNTER — Encounter (HOSPITAL_COMMUNITY): Payer: Self-pay | Admitting: *Deleted

## 2020-07-23 ENCOUNTER — Ambulatory Visit (HOSPITAL_COMMUNITY)
Admission: EM | Admit: 2020-07-23 | Discharge: 2020-07-23 | Disposition: A | Discharge status: Home / Self Care | Payer: Medicare Other | Attending: Urgent Care | Admitting: Urgent Care

## 2020-07-23 ENCOUNTER — Ambulatory Visit (INDEPENDENT_AMBULATORY_CARE_PROVIDER_SITE_OTHER): Payer: Medicare Other

## 2020-07-23 ENCOUNTER — Other Ambulatory Visit: Payer: Self-pay

## 2020-07-23 DIAGNOSIS — S32010A Wedge compression fracture of first lumbar vertebra, initial encounter for closed fracture: Secondary | ICD-10-CM

## 2020-07-23 DIAGNOSIS — M545 Low back pain, unspecified: Secondary | ICD-10-CM

## 2020-07-23 DIAGNOSIS — R937 Abnormal findings on diagnostic imaging of other parts of musculoskeletal system: Secondary | ICD-10-CM

## 2020-07-23 DIAGNOSIS — M5489 Other dorsalgia: Secondary | ICD-10-CM | POA: Diagnosis not present

## 2020-07-23 DIAGNOSIS — M8588 Other specified disorders of bone density and structure, other site: Secondary | ICD-10-CM

## 2020-07-23 DIAGNOSIS — Z9889 Other specified postprocedural states: Secondary | ICD-10-CM

## 2020-07-23 DIAGNOSIS — R39198 Other difficulties with micturition: Secondary | ICD-10-CM

## 2020-07-23 DIAGNOSIS — K5900 Constipation, unspecified: Secondary | ICD-10-CM

## 2020-07-23 MED ORDER — TAMSULOSIN HCL 0.4 MG PO CAPS
0.4000 mg | ORAL_CAPSULE | Freq: Every day | ORAL | 0 refills | Status: DC
Start: 2020-07-23 — End: 2020-10-24

## 2020-07-23 MED ORDER — TRAMADOL HCL 50 MG PO TABS: 50.0000 mg | ORAL_TABLET | Freq: Four times a day (QID) | ORAL | 0 refills | Status: DC | PRN

## 2020-07-23 MED ORDER — METHOCARBAMOL 500 MG PO TABS
500.0000 mg | ORAL_TABLET | Freq: Two times a day (BID) | ORAL | 0 refills | Status: DC | PRN
Start: 2020-07-23 — End: 2020-08-07

## 2020-07-23 NOTE — ED Provider Notes (Signed)
[place]   MRN: [id number] DOB: 1940-10-05  Subjective:   Ronnie Snyder is a 80 y.o. male presenting for 3-day history of cute onset recurrent bilateral low back pain.  Patient states that the symptoms are moderate to severe, mostly constant.  Patient does have a history of degenerative disc disease and lumbar stenosis, status post surgery of the lumbar region in 2019.  Denies weakness, radicular symptoms, numbness or tingling.  He has also had intermittent constipation, last bowel movement was 2 days ago.  Admits that he is not eating much fiber, has been eating a lot of cereal.  Has used MiraLAX, docusate and also used an enema once.  He recently had a colonoscopy in 2021.  Was found to have polyps, diverticulosis of the left colon and internal hemorrhoids.  No current facility-administered medications for this encounter.  Current Outpatient Medications:  .  acetaminophen (TYLENOL) 500 MG tablet, Take 500 mg by mouth every 6 (six) hours as needed for mild pain or headache., Disp: , Rfl:  .  amLODipine (NORVASC) 10 MG tablet, Take 1 tablet (10 mg total) by mouth daily., Disp: 90 tablet, Rfl: 1 .  ferrous sulfate 325 (65 FE) MG tablet, Take 1 tablet (325 mg total) by mouth daily with breakfast., Disp: 90 tablet, Rfl: 2 .  levothyroxine (SYNTHROID) 75 MCG tablet, Take 1 tablet (75 mcg total) by mouth daily before breakfast., Disp: 90 tablet, Rfl: 1 .  losartan (COZAAR) 100 MG tablet, TAKE 1 TABLET(100 MG) BY MOUTH DAILY, Disp: 90 tablet, Rfl: 0 .  pantoprazole (PROTONIX) 40 MG tablet, Take 1 tablet (40 mg total) by mouth daily., Disp: 30 tablet, Rfl: 11 .  tamsulosin (FLOMAX) 0.4 MG CAPS capsule, Take 1 capsule (0.4 mg total) by mouth daily., Disp: 30 capsule, Rfl: 3   Allergies  Allergen Reactions  . Hydrocodone-Acetaminophen Nausea Only    Vertigo, also  . Atorvastatin Nausea Only  . Other Nausea Only and Other (See Comments)    Vertigo and nausea: "ALL  PAINS/NARCOTIC MEDS-CANNOT TOLERATE WELL,"  per Med History prior to 05/23/17  . Penicillins Rash    Has patient had a PCN reaction causing immediate rash, facial/tongue/throat swelling, SOB or lightheadedness with hypotension: Unknown Has patient had a PCN reaction causing severe rash involving mucus membranes or skin necrosis: No Has patient had a PCN reaction that required hospitalization: No Has patient had a PCN reaction occurring within the last 10 years: No If all of the above answers are "NO", then may proceed with Cephalosporin use.   . Pioglitazone Nausea Only  . Vicodin [Hydrocodone-Acetaminophen] Nausea Only    Past Medical History:  Diagnosis Date  . Abdominal aortic aneurysm (HCC)    3.5 cm 10/2016 L-spine CT (previously 3.0 cm by U/S 02/25/11)  . Anxiety   . Arthritis    "in my feet" (10/13/2012)  . GERD (gastroesophageal reflux disease)   . H/O hiatal hernia   . Hepatic steatosis   . High cholesterol    "at one time; it's fine now" (10/13/2012)  . HTN (hypertension)   . Hypothyroidism   . PAD (peripheral artery disease) ([place])   . Skin cancer    "burned them off my arm and such" (10/13/2012)  . Stroke ([place])   . Tubular adenoma of colon 10/2019     Past Surgical History:  Procedure Laterality Date  . ABDOMINAL ANGIOGRAM N/A 12/04/2011   Procedure: ABDOMINAL ANGIOGRAM;  Surgeon: Sherren Mocha, MD;  Location: [facility] CATH  LAB;  Service: Cardiovascular;  Laterality: N/A;  . ABDOMINAL AORTAGRAM N/A 10/13/2012   Procedure: ABDOMINAL AORTAGRAM;  Surgeon: Serafina Mitchell, MD;  Location: [facility] CATH LAB;  Service: Cardiovascular;  Laterality: N/A;  . ANGIOPLASTY / STENTING FEMORAL Left 10/13/2012  . BIOPSY  11/11/2019   Procedure: BIOPSY;  Surgeon: Yetta Flock, MD;  Location: [place];  Service: Gastroenterology;;  . COLONOSCOPY WITH PROPOFOL N/A 11/11/2019   Procedure: COLONOSCOPY WITH PROPOFOL;  Surgeon: Yetta Flock, MD;  Location: [place];  Service:  Gastroenterology;  Laterality: N/A;  . ESOPHAGOGASTRODUODENOSCOPY    . ESOPHAGOGASTRODUODENOSCOPY (EGD) WITH PROPOFOL N/A 11/11/2019   Procedure: ESOPHAGOGASTRODUODENOSCOPY (EGD) WITH PROPOFOL;  Surgeon: Yetta Flock, MD;  Location: [place];  Service: Gastroenterology;  Laterality: N/A;  . FEMORAL ENDARTERECTOMY Left 01/23/12   Left Endarterectomy  with bovie patch Angioplasy  . gated spect wall motion stress cardiolite  02/10/2002  . HEMOSTASIS CLIP PLACEMENT  11/11/2019   Procedure: HEMOSTASIS CLIP PLACEMENT;  Surgeon: Yetta Flock, MD;  Location: [place];  Service: Gastroenterology;;  . HEMOSTASIS CONTROL  11/11/2019   Procedure: HEMOSTASIS CONTROL;  Surgeon: Yetta Flock, MD;  Location: [place];  Service: Gastroenterology;;  . HIP FRACTURE SURGERY Right 1990's  . HOT HEMOSTASIS N/A 11/11/2019   Procedure: HOT HEMOSTASIS (ARGON PLASMA COAGULATION/BICAP);  Surgeon: Yetta Flock, MD;  Location[facility] ENDOSCOPY;  Service: Gastroenterology;  Laterality: N/A;  . INGUINAL HERNIA REPAIR Right    "years ago" (10/13/2012)  . LUMBAR LAMINECTOMY/DECOMPRESSION MICRODISCECTOMY N/A 05/23/2017   Procedure: L4-5 DECOMPRESSION;  Surgeon: Marybelle Killings, MD;  Location: [place];  Service: Orthopedics;  Laterality: N/A;  . NOSE SURGERY    . POLYPECTOMY  11/11/2019   Procedure: POLYPECTOMY;  Surgeon: Yetta Flock, MD;  Location: [facility] ENDOSCOPY;  Service: Gastroenterology;;  . TONSILLECTOMY     "I was a chld" (10/13/2012)    Family History  Problem Relation Age of Onset  . Hypertension Mother   . Heart disease Mother   . Hypertension Father   . Heart disease Father   . Hypertension Sister   . Hypertension Sister   . Colon cancer Neg Hx   . Esophageal cancer Neg Hx   . Pancreatic cancer Neg Hx   . Stomach cancer Neg Hx     Social History   Tobacco Use  . Smoking status: Current Every Day Smoker    Packs/day: 0.25    Years: 57.00    Pack years: 14.25     Types: Cigarettes  . Smokeless tobacco: Never Used  . Tobacco comment: pt states there is no hope for quitting, pt states he smokes 1-2 cigarettes a day, wife has copd and doesnt smoke around her  Vaping Use  . Vaping Use: Never used  Substance Use Topics  . Alcohol use: Not Currently  . Drug use: No    ROS   Objective:   Vitals: BP 134/69 (BP Location: Right Arm)   Pulse 82   Temp 99.1 F (37.3 C) (Oral)   Resp 18   SpO2 100%   Physical Exam Constitutional:      General: He is not in acute distress.    Appearance: Normal appearance. He is well-developed and normal weight. He is not ill-appearing, toxic-appearing or diaphoretic.  HENT:     Head: Normocephalic and atraumatic.     Right Ear: External ear normal.     Left Ear: External ear normal.     Nose: Nose normal.  Mouth/Throat:     Pharynx: Oropharynx is clear.  Eyes:     General: No scleral icterus.       Right eye: No discharge.        Left eye: No discharge.     Extraocular Movements: Extraocular movements intact.     Pupils: Pupils are equal, round, and reactive to light.  Cardiovascular:     Rate and Rhythm: Normal rate.  Pulmonary:     Effort: Pulmonary effort is normal.  Musculoskeletal:     Cervical back: Normal range of motion.     Lumbar back: Tenderness (over paraspinal muscles of lumbar region) present. No swelling, edema, deformity, signs of trauma, lacerations, spasms or bony tenderness. Decreased range of motion. Negative right straight leg raise test and negative left straight leg raise test. No scoliosis.     Comments: Kyphosis noted over thoracic region.   Neurological:     Mental Status: He is alert and oriented to person, place, and time.  Psychiatric:        Mood and Affect: Mood normal.        Behavior: Behavior normal.        Thought Content: Thought content normal.        Judgment: Judgment normal.     DG Lumbar Spine Complete  Result Date: 07/23/2020 CLINICAL DATA:   80 year old male with recurrent back pain. EXAM: LUMBAR SPINE - COMPLETE 4+ VIEW COMPARISON:  CT abdomen pelvis from 08/10/2019 FINDINGS: Anterior wedge compression deformity of L1 with associated mild kyphosis at this level. No evidence of significant retropulsion. Alignment is otherwise within normal limits. Mild disc space height loss at L2-L3. Diffuse osteopenia. IMPRESSION: Age-indeterminate anterior wedge compression fracture of L1 with associated kyphosis at this level. No definite evidence of retropulsion. Consider lumbar spine MRI for further characterization. Electronically Signed   By: Ruthann Cancer MD   On: 07/23/2020 10:46     Assessment and Plan :   PDMP not reviewed this encounter.  1. Closed wedge compression fracture of L1 vertebra, initial encounter ([place])   2. Acute bilateral low back pain without sciatica   3. Abnormal x-ray of lumbar spine   4. Difficulty urinating   5. History of back surgery   6. Osteopenia of lumbar spine   7. Constipation, unspecified constipation type     It is unclear when the wedge resection occurred but is likely new as it was not seen in previous recent x-rays from 2019, 2018.  Patient does recall that he actually did do some heavy lifting about a week ago and his back pain started shortly thereafter.  Recommended scheduling Tylenol, using Robaxin and then tramadol for breakthrough pain.  Follow-up with spine specialist as soon as possible.  Counseled on back care.  Recommended hydrating very well.  Also advised dietary modifications, using a stool softener for intermittent constipation.  Restart Flomax and follow-up with urology. Counseled patient on potential for adverse effects with medications prescribed/recommended today, ER and return-to-clinic precautions discussed, patient verbalized understanding.    Jaynee Eagles, PA-C 07/23/20 1110

## 2020-07-23 NOTE — Discharge Instructions (Addendum)
Please take Tylenol at 500 mg every 6 hours as needed for your back pain.  If this does not help with your back pain then use 1 tablet of tramadol every 6 hours as needed together with the Tylenol.  This is a controlled substance and therefore if you do not need it then stick with Tylenol only.  It is okay for you to use methocarbamol as well as a muscle relaxant.  Please make sure that you follow-up with your spine specialist as soon as possible, call their office tomorrow for an urgent appointment regarding your new lumbar compression fracture.  Restart Flomax to help with your urinating.  Follow-up with your urologist soon as possible.  Make sure you start eating more fiber including salads, fruits, vegetables to help promote good bowel movements.  It is okay to use a stool softener but hold off on using laxatives for now.

## 2020-07-23 NOTE — ED Triage Notes (Addendum)
Pt reports bil back pain . Pt also reports he is constipated and having difficulty initiating stream . Pt has laxative and stool softener with no results. Pt is taking Methotrexate ,folice acid.

## 2020-07-23 NOTE — ED Notes (Signed)
Pt unable to void at this time. 

## 2020-07-24 ENCOUNTER — Telehealth: Payer: Self-pay | Admitting: Orthopaedic Surgery

## 2020-07-24 NOTE — Telephone Encounter (Signed)
Patient called requesting a call back. Patient states he went to [facility] yesterday and want Dr. Lorin Mercy to look at his chart to see if he can be seen sooner if chart says it's urgent to be seen. Patient states to be in severe pain and need to be seen way sooner. Please call patient at [phone] or [phone].

## 2020-07-25 NOTE — Telephone Encounter (Signed)
I called discussed has acute L1 compression fracture.

## 2020-07-27 ENCOUNTER — Telehealth: Payer: Self-pay | Admitting: Orthopaedic Surgery

## 2020-07-27 ENCOUNTER — Other Ambulatory Visit: Payer: Self-pay | Admitting: Orthopaedic Surgery

## 2020-07-27 MED ORDER — TRAMADOL HCL 50 MG PO TABS: 50.0000 mg | ORAL_TABLET | Freq: Four times a day (QID) | ORAL | 0 refills | Status: DC | PRN

## 2020-07-27 NOTE — Telephone Encounter (Signed)
Please advise 

## 2020-07-27 NOTE — Telephone Encounter (Signed)
Patient called requesting pain pills and medication for constipation. Patient states when he was at the hospital they gave him tramadol and methocarbamol. Patient states he has been constipated for awhile and need a laxaitive asap. Please send meds to pharmacy on file. Patient phone number is [phone]. Please call patient when meds have been called in.

## 2020-07-31 ENCOUNTER — Other Ambulatory Visit: Payer: Self-pay | Admitting: Family Medicine

## 2020-08-01 ENCOUNTER — Ambulatory Visit (INDEPENDENT_AMBULATORY_CARE_PROVIDER_SITE_OTHER): Payer: Medicare Other

## 2020-08-01 ENCOUNTER — Other Ambulatory Visit: Payer: Self-pay

## 2020-08-01 ENCOUNTER — Encounter: Payer: Self-pay | Admitting: Orthopaedic Surgery

## 2020-08-01 ENCOUNTER — Ambulatory Visit (INDEPENDENT_AMBULATORY_CARE_PROVIDER_SITE_OTHER): Payer: Medicare Other | Admitting: Orthopaedic Surgery

## 2020-08-01 VITALS — Ht 67.0 in | Wt 137.0 lb

## 2020-08-01 DIAGNOSIS — S32010A Wedge compression fracture of first lumbar vertebra, initial encounter for closed fracture: Secondary | ICD-10-CM | POA: Diagnosis not present

## 2020-08-01 MED ORDER — HYDROCODONE-ACETAMINOPHEN 5-325 MG PO TABS: 1.0000 | ORAL_TABLET | Freq: Four times a day (QID) | ORAL | 0 refills | Status: DC | PRN

## 2020-08-01 NOTE — Progress Notes (Signed)
Office Visit Note   Patient: Ronnie Snyder           Date of Birth: 19-Apr-1941           MRN: [id number] Visit Date: 08/01/2020              Requested by: Luetta Nutting, [address] [place],  [place] [address] PCP: Luetta Nutting, DO   Assessment & Plan: Visit Diagnoses:  1. Compression fracture of L1 vertebra, initial encounter [facility])     Plan: Patient can continue with stool softener he will still need to use enemas until bowels start moving.  He requested some stronger for pain he can try some Norco which was sent in minimal amount and he understands that this may give him problems with constipation as well.  He still continues to pass gas.  Recheck 1 month with lateral x-ray lumbar spine single view only.  Follow-Up Instructions: Return in about 1 month (around 09/01/2020).   Orders:  Orders Placed This Encounter  Procedures  . XR Lumbar Spine 2-3 Views   Meds ordered this encounter  Medications  . HYDROcodone-acetaminophen (NORCO/VICODIN) 5-325 MG tablet    Sig: Take 1 tablet by mouth every 6 (six) hours as needed for moderate pain.    Dispense:  20 tablet    Refill:  0    L1 compression fracture      Procedures: No procedures performed   Clinical Data: No additional findings.   Subjective: Chief Complaint  Patient presents with  . Lower Back - Follow-up    HPI 80 year old male follow-up L1 compression fracture.  Patient's had problems since taking Ultram and states he is tried several enemas but has not had much results.  No problems with constipation prior to the L1 compression fracture.  Onset of symptoms when he is trying to lift a recliner.  Patient's wife has some health problems he states is not able to help take care of her due to his back pain and has to lay down to get relief.  Review of Systems updated unchanged from last week.   Objective: Vital Signs: Ht 5\' 7"  (1.702 m)   Wt 137 lb (62.1 kg)   BMI 21.46 kg/m    Physical Exam Constitutional:      Appearance: He is well-developed.  HENT:     Head: Normocephalic and atraumatic.  Eyes:     Pupils: Pupils are equal, round, and reactive to light.  Neck:     Thyroid: No thyromegaly.     Trachea: No tracheal deviation.  Cardiovascular:     Rate and Rhythm: Normal rate.  Pulmonary:     Effort: Pulmonary effort is normal.     Breath sounds: No wheezing.  Abdominal:     General: Bowel sounds are normal.     Palpations: Abdomen is soft.  Skin:    General: Skin is warm and dry.     Capillary Refill: Capillary refill takes less than 2 seconds.  Neurological:     Mental Status: He is alert and oriented to person, place, and time.  Psychiatric:        Behavior: Behavior normal.        Thought Content: Thought content normal.        Judgment: Judgment normal.     Ortho Exam patient slow getting from sitting to standing.  He does better when he is laying on his side.  Sensation of lower extremities is intact.  Specialty Comments:  No specialty comments available.  Imaging: XR Lumbar Spine 2-3 Views  Result Date: 08/01/2020 AP and lateral x-ray demonstrates stable L1 compression fracture unchanged from 8 days ago. Impression: L1 compression fracture 80%.    PMFS History: Patient Active Problem List   Diagnosis Date Noted  . Fatigue 05/30/2020  . Benign neoplasm of colon   . Gastric AVM   . Iron deficiency anemia due to chronic blood loss 11/09/2019  . Barrett's esophagus without dysplasia 08/30/2019  . Steatosis of liver 08/19/2019  . GI bleed 08/18/2019  . Cerebral thrombosis with cerebral infarction 08/12/2019  . Symptomatic anemia 08/09/2019  . Acute respiratory failure with hypoxia ([place]) 08/09/2019  . Urinary hesitancy 10/26/2018  . Night sweats 10/26/2018  . Other specified hypothyroidism 05/29/2018  . Lumbar stenosis 05/23/2017  . Senile purpura ([place]) 09/23/2013  . PAD (peripheral artery disease) ([place]) 08/09/2013  .  Aftercare following surgery of the circulatory system, [place] 02/01/2013  . Hyponatremia 08/21/2012  . Smoker 08/13/2012  . Other and unspecified alcohol dependence, unspecified drinking behavior 08/13/2012  . Screening for prostate cancer 08/13/2012  . Antiplatelet or antithrombotic long-term use 08/13/2012  . Atherosclerosis of native artery of extremity with intermittent claudication ([place]) 08/21/2011  . ECZEMA 01/24/2010  . COUGH 01/24/2009  . AAA (abdominal aortic aneurysm) ([place]) 12/06/2008  . HYPERCHOLESTEROLEMIA 01/21/2008  . ERECTILE DYSFUNCTION, ORGANIC 01/21/2008  . Back pain 01/21/2008  . 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 Anxiety state 03/07/2007  . ERECTILE DYSFUNCTION 03/07/2007  . DEPRESSION 03/07/2007  . HTN (hypertension) 03/07/2007  . CAROTID ARTERY STENOSIS 03/07/2007  . HIP PAIN, RIGHT 03/07/2007  . COLONIC POLYPS, HX OF 03/07/2007  . ADENOIDECTOMY, HX OF 03/07/2007   Past Medical History:  Diagnosis Date  . Abdominal aortic aneurysm (HCC)    3.5 cm 10/2016 L-spine CT (previously 3.0 cm by U/S 02/25/11)  . Anxiety   . Arthritis    "in my feet" (10/13/2012)  . GERD (gastroesophageal reflux disease)   . H/O hiatal hernia   . Hepatic steatosis   . High cholesterol    "at one time; it's fine now" (10/13/2012)  . HTN (hypertension)   . Hypothyroidism   . PAD (peripheral artery disease) ([place])   . Skin cancer    "burned them off my arm and such" (10/13/2012)  . Stroke ([place])   . Tubular adenoma of colon 10/2019    Family History  Problem Relation Age of Onset  . Hypertension Mother   . Heart disease Mother   . Hypertension Father   . Heart disease Father   . Hypertension Sister   . Hypertension Sister   . Colon cancer Neg Hx   . Esophageal cancer Neg Hx   . Pancreatic cancer Neg Hx   . Stomach cancer Neg Hx     Past Surgical History:  Procedure Laterality Date  . ABDOMINAL ANGIOGRAM N/A 12/04/2011   Procedure: ABDOMINAL ANGIOGRAM;  Surgeon: Sherren Mocha, MD;   Location: [facility] CATH LAB;  Service: Cardiovascular;  Laterality: N/A;  . ABDOMINAL AORTAGRAM N/A 10/13/2012   Procedure: ABDOMINAL Maxcine Ham;  Surgeon: Serafina Mitchell, MD;  Location: [facility] CATH LAB;  Service: Cardiovascular;  Laterality: N/A;  . ANGIOPLASTY / STENTING FEMORAL Left 10/13/2012  . BIOPSY  11/11/2019   Procedure: BIOPSY;  Surgeon: Yetta Flock, MD;  Location: [place];  Service: Gastroenterology;;  . COLONOSCOPY WITH PROPOFOL N/A 11/11/2019   Procedure: COLONOSCOPY WITH PROPOFOL;  Surgeon: Yetta Flock, MD;  Location: [place] ENDOSCOPY;  Service: Gastroenterology;  Laterality: N/A;  . ESOPHAGOGASTRODUODENOSCOPY    . ESOPHAGOGASTRODUODENOSCOPY (EGD) WITH PROPOFOL N/A 11/11/2019   Procedure: ESOPHAGOGASTRODUODENOSCOPY (EGD) WITH PROPOFOL;  Surgeon: Yetta Flock, MD;  Location: [place];  Service: Gastroenterology;  Laterality: N/A;  . FEMORAL ENDARTERECTOMY Left 01/23/12   Left Endarterectomy  with bovie patch Angioplasy  . gated spect wall motion stress cardiolite  02/10/2002  . HEMOSTASIS CLIP PLACEMENT  11/11/2019   Procedure: HEMOSTASIS CLIP PLACEMENT;  Surgeon: Yetta Flock, MD;  Location: [place];  Service: Gastroenterology;;  . HEMOSTASIS CONTROL  11/11/2019   Procedure: HEMOSTASIS CONTROL;  Surgeon: Yetta Flock, MD;  Location: [place];  Service: Gastroenterology;;  . HIP FRACTURE SURGERY Right 1990's  . HOT HEMOSTASIS N/A 11/11/2019   Procedure: HOT HEMOSTASIS (ARGON PLASMA COAGULATION/BICAP);  Surgeon: Yetta Flock, MD;  Location: [facility] ENDOSCOPY;  Service: Gastroenterology;  Laterality: N/A;  . INGUINAL HERNIA REPAIR Right    "years ago" (10/13/2012)  . LUMBAR LAMINECTOMY/DECOMPRESSION MICRODISCECTOMY N/A 05/23/2017   Procedure: L4-5 DECOMPRESSION;  Surgeon: Marybelle Killings, MD;  Location: [place];  Service: Orthopedics;  Laterality: N/A;  . NOSE SURGERY    . POLYPECTOMY  11/11/2019   Procedure: POLYPECTOMY;  Surgeon: Yetta Flock, MD;  Location: [facility] ENDOSCOPY;  Service: Gastroenterology;;  . TONSILLECTOMY     "I was a chld" (10/13/2012)   Social History   Occupational History  . Occupation: retired    Comment: optician  Tobacco Use  . Smoking status: Current Every Day Smoker    Packs/day: 0.25    Years: 57.00    Pack years: 14.25    Types: Cigarettes  . Smokeless tobacco: Never Used  . Tobacco comment: pt states there is no hope for quitting, pt states he smokes 1-2 cigarettes a day, wife has copd and doesnt smoke around her  Vaping Use  . Vaping Use: Never used  Substance and Sexual Activity  . Alcohol use: Not Currently  . Drug use: No  . Sexual activity: Not on file

## 2020-08-02 ENCOUNTER — Emergency Department (HOSPITAL_BASED_OUTPATIENT_CLINIC_OR_DEPARTMENT_OTHER): Payer: Medicare Other | Admitting: Radiology

## 2020-08-02 ENCOUNTER — Other Ambulatory Visit: Payer: Self-pay

## 2020-08-02 ENCOUNTER — Emergency Department (HOSPITAL_BASED_OUTPATIENT_CLINIC_OR_DEPARTMENT_OTHER)
Admission: EM | Admit: 2020-08-02 | Discharge: 2020-08-02 | Disposition: A | Discharge status: Home / Self Care | Payer: Medicare Other | Attending: Emergency Medicine | Admitting: Emergency Medicine

## 2020-08-02 ENCOUNTER — Ambulatory Visit (HOSPITAL_COMMUNITY): Admission: EM | Admit: 2020-08-02 | Discharge: 2020-08-02 | Payer: Medicare Other

## 2020-08-02 ENCOUNTER — Emergency Department (HOSPITAL_BASED_OUTPATIENT_CLINIC_OR_DEPARTMENT_OTHER): Payer: Medicare Other

## 2020-08-02 ENCOUNTER — Encounter (HOSPITAL_BASED_OUTPATIENT_CLINIC_OR_DEPARTMENT_OTHER): Payer: Self-pay | Admitting: Emergency Medicine

## 2020-08-02 DIAGNOSIS — Z79899 Other long term (current) drug therapy: Secondary | ICD-10-CM | POA: Insufficient documentation

## 2020-08-02 DIAGNOSIS — Z85828 Personal history of other malignant neoplasm of skin: Secondary | ICD-10-CM | POA: Insufficient documentation

## 2020-08-02 DIAGNOSIS — S32018A Other fracture of first lumbar vertebra, initial encounter for closed fracture: Secondary | ICD-10-CM | POA: Diagnosis not present

## 2020-08-02 DIAGNOSIS — X500XXA Overexertion from strenuous movement or load, initial encounter: Secondary | ICD-10-CM | POA: Insufficient documentation

## 2020-08-02 DIAGNOSIS — I714 Abdominal aortic aneurysm, without rupture: Secondary | ICD-10-CM | POA: Diagnosis not present

## 2020-08-02 DIAGNOSIS — R143 Flatulence: Secondary | ICD-10-CM | POA: Diagnosis not present

## 2020-08-02 DIAGNOSIS — S32010A Wedge compression fracture of first lumbar vertebra, initial encounter for closed fracture: Secondary | ICD-10-CM

## 2020-08-02 DIAGNOSIS — I10 Essential (primary) hypertension: Secondary | ICD-10-CM | POA: Diagnosis not present

## 2020-08-02 DIAGNOSIS — S3992XA Unspecified injury of lower back, initial encounter: Secondary | ICD-10-CM | POA: Diagnosis present

## 2020-08-02 DIAGNOSIS — M545 Low back pain, unspecified: Secondary | ICD-10-CM | POA: Diagnosis not present

## 2020-08-02 DIAGNOSIS — K5641 Fecal impaction: Secondary | ICD-10-CM | POA: Diagnosis not present

## 2020-08-02 DIAGNOSIS — K5900 Constipation, unspecified: Secondary | ICD-10-CM | POA: Insufficient documentation

## 2020-08-02 DIAGNOSIS — F17210 Nicotine dependence, cigarettes, uncomplicated: Secondary | ICD-10-CM | POA: Diagnosis not present

## 2020-08-02 DIAGNOSIS — E039 Hypothyroidism, unspecified: Secondary | ICD-10-CM | POA: Diagnosis not present

## 2020-08-02 DIAGNOSIS — R52 Pain, unspecified: Secondary | ICD-10-CM

## 2020-08-02 LAB — BASIC METABOLIC PANEL
Anion gap: 12 (ref 5–15)
BUN: 13 mg/dL (ref 8–23)
CO2: 26 mmol/L (ref 22–32)
Calcium: 9.1 mg/dL (ref 8.9–10.3)
Chloride: 90 mmol/L — ABNORMAL LOW (ref 98–111)
Creatinine, Ser: 0.52 mg/dL — ABNORMAL LOW (ref 0.61–1.24)
GFR, Estimated: >60 mL/min (ref >60)
Glucose, Bld: 93 mg/dL (ref 70–99)
Potassium: 3.6 mmol/L (ref 3.5–5.1)
Sodium: 128 mmol/L — ABNORMAL LOW (ref 135–145)

## 2020-08-02 LAB — CBC WITH DIFFERENTIAL/PLATELET
Abs Immature Granulocytes: 0.06 10*3/uL (ref 0.00–0.07)
Basophils Absolute: 0.1 10*3/uL (ref 0.0–0.1)
Basophils Relative: 1 %
Eosinophils Absolute: 0 10*3/uL (ref 0.0–0.5)
Eosinophils Relative: 0 %
HCT: 36.5 % — ABNORMAL LOW (ref 39.0–52.0)
Hemoglobin: 12.5 g/dL — ABNORMAL LOW (ref 13.0–17.0)
Immature Granulocytes: 1 %
Lymphocytes Relative: 14 %
Lymphs Abs: 0.8 10*3/uL (ref 0.7–4.0)
MCH: 31.5 pg (ref 26.0–34.0)
MCHC: 34.2 g/dL (ref 30.0–36.0)
MCV: 91.9 fL (ref 80.0–100.0)
Monocytes Absolute: 0.5 10*3/uL (ref 0.1–1.0)
Monocytes Relative: 10 %
Neutro Abs: 4.2 10*3/uL (ref 1.7–7.7)
Neutrophils Relative %: 74 %
Platelets: 246 10*3/uL (ref 150–400)
RBC: 3.97 MIL/uL — ABNORMAL LOW (ref 4.22–5.81)
RDW: 14.6 % (ref 11.5–15.5)
WBC: 5.7 10*3/uL (ref 4.0–10.5)
nRBC: 0 % (ref 0.0–0.2)

## 2020-08-02 LAB — OCCULT BLOOD X 1 CARD TO LAB, STOOL: Fecal Occult Bld: NEGATIVE

## 2020-08-02 MED ORDER — FLEET ENEMA 7-19 GM/118ML RE ENEM
1.0000 | ENEMA | Freq: Once | RECTAL | Status: AC
  Administered 2020-08-02: 1 via RECTAL
  Filled 2020-08-02: qty 1

## 2020-08-02 MED ORDER — IOHEXOL 300 MG/ML  SOLN
80.0000 mL | Freq: Once | INTRAMUSCULAR | Status: AC | PRN
  Administered 2020-08-02: 80 mL via INTRAVENOUS

## 2020-08-02 MED ORDER — POLYETHYLENE GLYCOL 3350 17 G PO PACK: 17.0000 g | PACK | Freq: Every day | ORAL | 0 refills | Status: AC | PRN

## 2020-08-02 MED ORDER — ONDANSETRON 4 MG PO TBDP
4.0000 mg | ORAL_TABLET | Freq: Once | ORAL | Status: DC
  Filled 2020-08-02: qty 1

## 2020-08-02 MED ORDER — ONDANSETRON HCL 4 MG/2ML IJ SOLN
4.0000 mg | Freq: Once | INTRAMUSCULAR | Status: AC
  Administered 2020-08-02: 4 mg via INTRAVENOUS
  Filled 2020-08-02: qty 2

## 2020-08-02 MED ORDER — SODIUM CHLORIDE 0.9 % IV BOLUS
500.0000 mL | Freq: Once | INTRAVENOUS | Status: AC
  Administered 2020-08-02: 500 mL via INTRAVENOUS

## 2020-08-02 NOTE — Discharge Instructions (Addendum)
Recommend using MiraLAX for constipation.  Recommend 1-2 scoops every 24 hours until desired effect.  You may increase MiraLAX dosage with 1 extra scoop daily as needed and then titrate down to have nice soft stools.  Drink plenty of fluids.

## 2020-08-02 NOTE — ED Notes (Signed)
Patient verbalized understanding of dc instructions, prescriptions, follow up referrals and reasons to return to ER for reevaluation.  

## 2020-08-02 NOTE — ED Provider Notes (Signed)
Patient here for constipation.  Recent L1 compression fracture.  Now on narcotic pain medicine.  Has not had a bowel movement in about a week.  Using stool softener and enema without much improvement.  Awaiting CT scan to rule out other abdominal processes.  Has history of aneurysms.  CT scan shows stable aneurysms.  He is aware of these.  Otherwise there was no stool impaction.  Moderate constipation.  Given fleets enema.  Stable appearing L1 fracture, known.  Given instructions on MiraLAX use.  Discharged in good condition.   Lennice Sites, DO 08/02/20 1704

## 2020-08-02 NOTE — ED Provider Notes (Signed)
[facility] DEPT Provider Note   CSN: [id number] Arrival date & time: 08/02/20  1337     History Chief Complaint  Patient presents with  . Constipation    Ronnie Snyder is a 80 y.o. male.  He is here with a complaint of constipation.  He experienced an L1 compression fracture while moving a recliner about 10 days ago.  Was taking tramadol and then switched over to hydrocodone for pain.  Has not moved his bowels in 8 days.  Using stool softener and enema without improvement.  Positive Flatus. Not eating much. Complaining of back pain due to the compression fracture.  No abdominal pain no rectal pain.  No bleeding.  States his back hurts so he cannot push when trying to move his bowels.  The history is provided by the patient.  Constipation Severity:  Moderate Time since last bowel movement:  8 days Timing:  Constant Progression:  Unchanged Chronicity:  New Context: narcotics   Stool description:  None produced Relieved by:  Nothing Worsened by:  Nothing Ineffective treatments:  Enemas, laxatives and stool softeners Associated symptoms: back pain and flatus   Associated symptoms: no abdominal pain, no diarrhea, no dysuria, no fever, no hematochezia, no nausea and no vomiting        Past Medical History:  Diagnosis Date  . Abdominal aortic aneurysm (HCC)    3.5 cm 10/2016 L-spine CT (previously 3.0 cm by U/S 02/25/11)  . Anxiety   . Arthritis    "in my feet" (10/13/2012)  . GERD (gastroesophageal reflux disease)   . H/O hiatal hernia   . Hepatic steatosis   . High cholesterol    "at one time; it's fine now" (10/13/2012)  . HTN (hypertension)   . Hypothyroidism   . PAD (peripheral artery disease) ([place])   . Skin cancer    "burned them off my arm and such" (10/13/2012)  . Stroke ([place])   . Tubular adenoma of colon 10/2019    Patient Active Problem List   Diagnosis Date Noted  . Fatigue 05/30/2020  . Benign neoplasm of colon   . Gastric AVM    . Iron deficiency anemia due to chronic blood loss 11/09/2019  . Barrett's esophagus without dysplasia 08/30/2019  . Steatosis of liver 08/19/2019  . GI bleed 08/18/2019  . Cerebral thrombosis with cerebral infarction 08/12/2019  . Symptomatic anemia 08/09/2019  . Acute respiratory failure with hypoxia ([place]) 08/09/2019  . Urinary hesitancy 10/26/2018  . Night sweats 10/26/2018  . Other specified hypothyroidism 05/29/2018  . Lumbar stenosis 05/23/2017  . Senile purpura ([place]) 09/23/2013  . PAD (peripheral artery disease) ([place]) 08/09/2013  . Aftercare following surgery of the circulatory system, [place] 02/01/2013  . Hyponatremia 08/21/2012  . Smoker 08/13/2012  . Other and unspecified alcohol dependence, unspecified drinking behavior 08/13/2012  . Screening for prostate cancer 08/13/2012  . Antiplatelet or antithrombotic long-term use 08/13/2012  . Atherosclerosis of native artery of extremity with intermittent claudication ([place]) 08/21/2011  . ECZEMA 01/24/2010  . COUGH 01/24/2009  . AAA (abdominal aortic aneurysm) ([place]) 12/06/2008  . HYPERCHOLESTEROLEMIA 01/21/2008  . ERECTILE DYSFUNCTION, ORGANIC 01/21/2008  . Back pain 01/21/2008  . 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 Anxiety state 03/07/2007  . ERECTILE DYSFUNCTION 03/07/2007  . DEPRESSION 03/07/2007  . HTN (hypertension) 03/07/2007  . CAROTID ARTERY STENOSIS 03/07/2007  . HIP PAIN, RIGHT 03/07/2007  . COLONIC POLYPS, HX OF 03/07/2007  . ADENOIDECTOMY, HX OF 03/07/2007    Past Surgical History:  Procedure Laterality  Date  . ABDOMINAL ANGIOGRAM N/A 12/04/2011   Procedure: ABDOMINAL ANGIOGRAM;  Surgeon: Sherren Mocha, MD;  Location: [facility] CATH LAB;  Service: Cardiovascular;  Laterality: N/A;  . ABDOMINAL AORTAGRAM N/A 10/13/2012   Procedure: ABDOMINAL Maxcine Ham;  Surgeon: Serafina Mitchell, MD;  Location: [facility] CATH LAB;  Service: Cardiovascular;  Laterality: N/A;  . ANGIOPLASTY / STENTING FEMORAL Left 10/13/2012  . BIOPSY  11/11/2019    Procedure: BIOPSY;  Surgeon: Yetta Flock, MD;  Location: [facility];  Service: Gastroenterology;;  . COLONOSCOPY WITH PROPOFOL N/A 11/11/2019   Procedure: COLONOSCOPY WITH PROPOFOL;  Surgeon: Yetta Flock, MD;  Location: [facility];  Service: Gastroenterology;  Laterality: N/A;  . ESOPHAGOGASTRODUODENOSCOPY    . ESOPHAGOGASTRODUODENOSCOPY (EGD) WITH PROPOFOL N/A 11/11/2019   Procedure: ESOPHAGOGASTRODUODENOSCOPY (EGD) WITH PROPOFOL;  Surgeon: Yetta Flock, MD;  Location: [place];  Service: Gastroenterology;  Laterality: N/A;  . FEMORAL ENDARTERECTOMY Left 01/23/12   Left Endarterectomy  with bovie patch Angioplasy  . gated spect wall motion stress cardiolite  02/10/2002  . HEMOSTASIS CLIP PLACEMENT  11/11/2019   Procedure: HEMOSTASIS CLIP PLACEMENT;  Surgeon: Yetta Flock, MD;  Location: [place];  Service: Gastroenterology;;  . HEMOSTASIS CONTROL  11/11/2019   Procedure: HEMOSTASIS CONTROL;  Surgeon: Yetta Flock, MD;  Location: [facility];  Service: Gastroenterology;;  . HIP FRACTURE SURGERY Right 1990's  . HOT HEMOSTASIS N/A 11/11/2019   Procedure: HOT HEMOSTASIS (ARGON PLASMA COAGULATION/BICAP);  Surgeon: Yetta Flock, MD;  Location[facility] ENDOSCOPY;  Service: Gastroenterology;  Laterality: N/A;  . INGUINAL HERNIA REPAIR Right    "years ago" (10/13/2012)  . LUMBAR LAMINECTOMY/DECOMPRESSION MICRODISCECTOMY N/A 05/23/2017   Procedure: L4-5 DECOMPRESSION;  Surgeon: Marybelle Killings, MD;  Location: [place];  Service: Orthopedics;  Laterality: N/A;  . NOSE SURGERY    . POLYPECTOMY  11/11/2019   Procedure: POLYPECTOMY;  Surgeon: Yetta Flock, MD;  Location: [facility] ENDOSCOPY;  Service: Gastroenterology;;  . TONSILLECTOMY     "I was a chld" (10/13/2012)       Family History  Problem Relation Age of Onset  . Hypertension Mother   . Heart disease Mother   . Hypertension Father   . Heart disease Father   . Hypertension Sister   .  Hypertension Sister   . Colon cancer Neg Hx   . Esophageal cancer Neg Hx   . Pancreatic cancer Neg Hx   . Stomach cancer Neg Hx     Social History   Tobacco Use  . Smoking status: Current Every Day Smoker    Packs/day: 0.25    Years: 57.00    Pack years: 14.25    Types: Cigarettes  . Smokeless tobacco: Never Used  . Tobacco comment: pt states there is no hope for quitting, pt states he smokes 1-2 cigarettes a day, wife has copd and doesnt smoke around her  Vaping Use  . Vaping Use: Never used  Substance Use Topics  . Alcohol use: Not Currently  . Drug use: No    Home Medications Prior to Admission medications   Medication Sig Start Date End Date Taking? Authorizing Provider  acetaminophen (TYLENOL) 500 MG tablet Take 500 mg by mouth every 6 (six) hours as needed for mild pain or headache.    [provider]  amLODipine (NORVASC) 10 MG tablet Take 1 tablet (10 mg total) by mouth daily. 02/18/20   Luetta Nutting, DO  ferrous sulfate 325 (65 FE) MG tablet Take 1 tablet (325 mg total) by mouth  daily with breakfast. 02/10/20 05/10/20  Luetta Nutting, DO  HYDROcodone-acetaminophen (NORCO/VICODIN) 5-325 MG tablet Take 1 tablet by mouth every 6 (six) hours as needed for moderate pain. 08/01/20   Marybelle Killings, MD  levothyroxine (SYNTHROID) 75 MCG tablet TAKE 1 TABLET(75 MCG) BY MOUTH DAILY BEFORE BREAKFAST 07/31/20   Luetta Nutting, DO  losartan (COZAAR) 100 MG tablet TAKE 1 TABLET(100 MG) BY MOUTH DAILY 06/26/20   Luetta Nutting, DO  methocarbamol (ROBAXIN) 500 MG tablet Take 1 tablet (500 mg total) by mouth 2 (two) times daily as needed for muscle spasms. 07/23/20   Jaynee Eagles, PA-C  pantoprazole (PROTONIX) 40 MG tablet Take 1 tablet (40 mg total) by mouth daily. 01/05/20 01/04/21  Ladene Artist, MD  tamsulosin (FLOMAX) 0.4 MG CAPS capsule Take 1 capsule (0.4 mg total) by mouth daily. 07/23/20   Jaynee Eagles, PA-C  traMADol (ULTRAM) 50 MG tablet Take 1 tablet (50 mg total) by mouth  every 6 (six) hours as needed. 07/27/20   Marybelle Killings, MD    Allergies    Hydrocodone-acetaminophen, Atorvastatin, Other, Penicillins, Pioglitazone, and Vicodin [hydrocodone-acetaminophen]  Review of Systems   Review of Systems  Constitutional: Negative for fever.  HENT: Negative for sore throat.   Eyes: Negative for visual disturbance.  Respiratory: Negative for shortness of breath.   Cardiovascular: Negative for chest pain.  Gastrointestinal: Positive for constipation and flatus. Negative for abdominal pain, anal bleeding, blood in stool, diarrhea, hematochezia, nausea and vomiting.  Genitourinary: Negative for dysuria.  Musculoskeletal: Positive for back pain.  Skin: Negative for rash.  Neurological: Negative for headaches.    Physical Exam Updated Vital Signs BP (!) 177/88 (BP Location: Right Arm)   Pulse 94   Temp 98.3 F (36.8 C) (Oral)   Resp 18   SpO2 99%   Physical Exam Vitals and nursing note reviewed.  Constitutional:      Appearance: He is well-developed.  HENT:     Head: Normocephalic and atraumatic.  Eyes:     Conjunctiva/sclera: Conjunctivae normal.  Cardiovascular:     Rate and Rhythm: Normal rate and regular rhythm.     Heart sounds: No murmur heard.   Pulmonary:     Effort: Pulmonary effort is normal. No respiratory distress.     Breath sounds: Normal breath sounds.  Abdominal:     Palpations: Abdomen is soft.     Tenderness: There is no abdominal tenderness.  Genitourinary:    Rectum: Normal.     Comments: Rectal exam done with nurse Vicente Serene as chaperone.  No masses, no stool.  Guaiac done and negative. Musculoskeletal:        General: Tenderness (lumbar) present. Normal range of motion.     Cervical back: Neck supple.  Skin:    General: Skin is warm and dry.     Capillary Refill: Capillary refill takes less than 2 seconds.  Neurological:     General: No focal deficit present.     Mental Status: He is alert.     GCS: GCS eye subscore is  4. GCS verbal subscore is 5. GCS motor subscore is 6.     ED Results / Procedures / Treatments   Labs (all labs ordered are listed, but only abnormal results are displayed) Labs Reviewed  BASIC METABOLIC PANEL - Abnormal; Notable for the following components:      Result Value   Sodium 128 (*)    Chloride 90 (*)    Creatinine, Ser 0.52 (*)  All other components within normal limits  CBC WITH DIFFERENTIAL/PLATELET - Abnormal; Notable for the following components:   RBC 3.97 (*)    Hemoglobin 12.5 (*)    HCT 36.5 (*)    All other components within normal limits  OCCULT BLOOD X 1 CARD TO LAB, STOOL    EKG None  Radiology DG Abdomen 1 View  Result Date: 08/02/2020 CLINICAL DATA:  Constipation for 1-1/2 weeks. EXAM: ABDOMEN - 1 VIEW COMPARISON:  Abdominal CT 08/10/2019 FINDINGS: No bowel dilatation to suggest obstruction. Small to moderate colonic stool burden. No abnormal rectal distension. No evidence of free air. Clip projects over the left upper quadrant. There are vascular calcifications. No radiopaque calculi. Bones are diffusely under mineralized. IMPRESSION: No evidence of obstruction or fecal impaction. Small to moderate colonic stool burden. Electronically Signed   By: Keith Rake M.D.   On: 08/02/2020 14:59   XR Lumbar Spine 2-3 Views  Result Date: 08/01/2020 AP and lateral x-ray demonstrates stable L1 compression fracture unchanged from 8 days ago. Impression: L1 compression fracture 80%.   Procedures Procedures   Medications Ordered in ED Medications  sodium chloride 0.9 % bolus 500 mL (500 mLs Intravenous New Bag/Given 08/02/20 1510)  iohexol (OMNIPAQUE) 300 MG/ML solution 80 mL (80 mLs Intravenous Contrast Given 08/02/20 1553)    ED Course  I have reviewed the triage vital signs and the nursing notes.  Pertinent labs & imaging results that were available during my care of the patient were reviewed by me and considered in my medical decision making (see  chart for details).    MDM Rules/Calculators/A&P                         This patient complains of constipation and back pain; this involves an extensive number of treatment Options and is a complaint that carries with it a high risk of complications and Morbidity. The differential includes constipation, lumbar fracture, obstruction, diverticulitis, ileus, obstruction, AAA  I ordered, reviewed and interpreted labs, which included CBC with normal white count, low but stable hemoglobin, chemistries with a hyponatremia of 128, low chloride.  Fecal occult negative. I ordered medication IV fluids I ordered imaging studies which included KUB and CT abdomen and pelvis and I independently    visualized and interpreted imaging which showed mild to moderate stool.  Results of CTA are pending at time of signout Additional history obtained from patient son Previous records obtained and reviewed in epic including urgent care visit and outpatient follow-up with orthopedics  After the interventions stated above, I reevaluated the patient and found patient still to be with symptomatic back pain.  His care is signed out to oncoming provider Dr. Ronnald Nian to follow-up on CAT scan.  If no significant findings patient may benefit from an enema.   Final Clinical Impression(s) / ED Diagnoses Final diagnoses:  Pain  Closed compression fracture of body of L1 vertebra (HCC)  Constipation, unspecified constipation type    Rx / DC Orders ED Discharge Orders    None       Hayden Rasmussen, MD 08/02/20 1626

## 2020-08-02 NOTE — ED Triage Notes (Signed)
Constipation x 1.5 weeks. Has been taking pain medication due to a compression fx. Reports trying OTC enemas and laxatives without relief.

## 2020-08-02 NOTE — ED Notes (Signed)
Patient only able to urinate and pass gas, plus enema contents, no stool.

## 2020-08-02 NOTE — ED Notes (Signed)
Called patient's son [name] for transportation home.

## 2020-08-02 NOTE — ED Notes (Signed)
Patient on bedside commode

## 2020-08-05 ENCOUNTER — Other Ambulatory Visit: Payer: Self-pay

## 2020-08-05 ENCOUNTER — Encounter (HOSPITAL_BASED_OUTPATIENT_CLINIC_OR_DEPARTMENT_OTHER): Payer: Self-pay | Admitting: Emergency Medicine

## 2020-08-05 ENCOUNTER — Emergency Department (HOSPITAL_BASED_OUTPATIENT_CLINIC_OR_DEPARTMENT_OTHER)
Admission: EM | Admit: 2020-08-05 | Discharge: 2020-08-05 | Disposition: A | Discharge status: Home / Self Care | Payer: Medicare Other | Attending: Emergency Medicine | Admitting: Emergency Medicine

## 2020-08-05 DIAGNOSIS — I10 Essential (primary) hypertension: Secondary | ICD-10-CM | POA: Insufficient documentation

## 2020-08-05 DIAGNOSIS — F17210 Nicotine dependence, cigarettes, uncomplicated: Secondary | ICD-10-CM | POA: Insufficient documentation

## 2020-08-05 DIAGNOSIS — Z79899 Other long term (current) drug therapy: Secondary | ICD-10-CM | POA: Diagnosis not present

## 2020-08-05 DIAGNOSIS — Z8673 Personal history of transient ischemic attack (TIA), and cerebral infarction without residual deficits: Secondary | ICD-10-CM | POA: Insufficient documentation

## 2020-08-05 DIAGNOSIS — E039 Hypothyroidism, unspecified: Secondary | ICD-10-CM | POA: Insufficient documentation

## 2020-08-05 DIAGNOSIS — Z85828 Personal history of other malignant neoplasm of skin: Secondary | ICD-10-CM | POA: Insufficient documentation

## 2020-08-05 DIAGNOSIS — Z8546 Personal history of malignant neoplasm of prostate: Secondary | ICD-10-CM | POA: Diagnosis not present

## 2020-08-05 DIAGNOSIS — K5903 Drug induced constipation: Secondary | ICD-10-CM | POA: Diagnosis not present

## 2020-08-05 MED ORDER — LIDOCAINE HCL URETHRAL/MUCOSAL 2 % EX GEL
1.0000 "application " | Freq: Once | CUTANEOUS | Status: AC
  Administered 2020-08-05: 1 via TOPICAL
  Filled 2020-08-05: qty 11

## 2020-08-05 MED ORDER — MAGNESIUM CITRATE PO SOLN: 1.0000 | Freq: Once | ORAL | 0 refills | Status: AC

## 2020-08-05 MED ORDER — SENNOSIDES-DOCUSATE SODIUM 8.6-50 MG PO TABS: 2.0000 | ORAL_TABLET | Freq: Two times a day (BID) | ORAL | 0 refills | Status: AC | PRN

## 2020-08-05 NOTE — Discharge Instructions (Addendum)
Take 4-6 caps of miralax in 1 32oz bottle of gatorade or similar in addition to senna-docusate twice daily.  Take the 4-6 caps of miralax on day 1,  3 caps on day 2, 2 caps day 3 and continue as needed. STAY HYDRATED, walk as tolerated, eat foods with fiber.  If no results with miralax after 2 days of the miralax taper, take the magnesium citrate prescribed.   Decreasing your pain medications for your back will also help you move your bowels.  We hope that with this regimen you will begin to feel better and have some movement!  If you develop severe abdominal pain, fevers, vomiting or other concerns please return to the ED for further evaluation.

## 2020-08-05 NOTE — ED Provider Notes (Signed)
[facility] EMERGENCY DEPT Provider Note   CSN: [id number] Arrival date & time: 08/05/20  0654     History Chief Complaint  Patient presents with  . Abdominal Pain  . Fecal Impaction    GANON DEMASI is a 80 y.o. male.  HPI      80 year old male with history significant for L1 fracture diagnosed on March 6 while lifting a recliner followed by Dr. Lorin Mercy, on hydrocodone, with new development of constipation since he developed fracture, evaluation in the emergency department 3 days ago for constipation at which time he had a CT which showed stable intra-abdominal aneurysms, no signs of bowel obstruction or other acute abnormalities, who presents with concern for continuing constipation.  Reports he still has had no bowel movements for approximately 2 weeks.  He is passing flatus. Used 2 fleets enemas with passing of dark liquid but no stool.  Is still eating. He is not having nausea, vomiting or abdominal pain.  Feels that when he strains his back pain is worse and his lumbar fracture is decreasing his ability to bear down.  He had discussed this with Dr. Lorin Mercy and was switched from tramadol to hydrocodone.  He has been taking miralax one cap twice daily but stopped taking stool softeners, reporting someone told him to stop.  He has chronic urinary problems that are not changed, no retention or incontinence.   Past Medical History:  Diagnosis Date  . Abdominal aortic aneurysm (HCC)    3.5 cm 10/2016 L-spine CT (previously 3.0 cm by U/S 02/25/11)  . Anxiety   . Arthritis    "in my feet" (10/13/2012)  . GERD (gastroesophageal reflux disease)   . H/O hiatal hernia   . Hepatic steatosis   . High cholesterol    "at one time; it's fine now" (10/13/2012)  . HTN (hypertension)   . Hypothyroidism   . PAD (peripheral artery disease) ([place])   . Skin cancer    "burned them off my arm and such" (10/13/2012)  . Stroke ([place])   . Tubular adenoma of colon 10/2019    Patient  Active Problem List   Diagnosis Date Noted  . Fatigue 05/30/2020  . Benign neoplasm of colon   . Gastric AVM   . Iron deficiency anemia due to chronic blood loss 11/09/2019  . Barrett's esophagus without dysplasia 08/30/2019  . Steatosis of liver 08/19/2019  . GI bleed 08/18/2019  . Cerebral thrombosis with cerebral infarction 08/12/2019  . Symptomatic anemia 08/09/2019  . Acute respiratory failure with hypoxia ([place]) 08/09/2019  . Urinary hesitancy 10/26/2018  . Night sweats 10/26/2018  . Other specified hypothyroidism 05/29/2018  . Lumbar stenosis 05/23/2017  . Senile purpura ([place]) 09/23/2013  . PAD (peripheral artery disease) ([place]) 08/09/2013  . Aftercare following surgery of the circulatory system, [place] 02/01/2013  . Hyponatremia 08/21/2012  . Smoker 08/13/2012  . Other and unspecified alcohol dependence, unspecified drinking behavior 08/13/2012  . Screening for prostate cancer 08/13/2012  . Antiplatelet or antithrombotic long-term use 08/13/2012  . Atherosclerosis of native artery of extremity with intermittent claudication ([place]) 08/21/2011  . ECZEMA 01/24/2010  . COUGH 01/24/2009  . AAA (abdominal aortic aneurysm) ([place]) 12/06/2008  . HYPERCHOLESTEROLEMIA 01/21/2008  . ERECTILE DYSFUNCTION, ORGANIC 01/21/2008  . Back pain 01/21/2008  . 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 Anxiety state 03/07/2007  . ERECTILE DYSFUNCTION 03/07/2007  . DEPRESSION 03/07/2007  . HTN (hypertension) 03/07/2007  . CAROTID ARTERY STENOSIS 03/07/2007  . HIP PAIN, RIGHT 03/07/2007  . COLONIC POLYPS, HX  OF 03/07/2007  . ADENOIDECTOMY, HX OF 03/07/2007    Past Surgical History:  Procedure Laterality Date  . ABDOMINAL ANGIOGRAM N/A 12/04/2011   Procedure: ABDOMINAL ANGIOGRAM;  Surgeon: Sherren Mocha, MD;  Location: [facility] CATH LAB;  Service: Cardiovascular;  Laterality: N/A;  . ABDOMINAL AORTAGRAM N/A 10/13/2012   Procedure: ABDOMINAL Maxcine Ham;  Surgeon: Serafina Mitchell, MD;  Location: [facility] CATH LAB;  Service:  Cardiovascular;  Laterality: N/A;  . ANGIOPLASTY / STENTING FEMORAL Left 10/13/2012  . BIOPSY  11/11/2019   Procedure: BIOPSY;  Surgeon: Yetta Flock, MD;  Location: [facility];  Service: Gastroenterology;;  . COLONOSCOPY WITH PROPOFOL N/A 11/11/2019   Procedure: COLONOSCOPY WITH PROPOFOL;  Surgeon: Yetta Flock, MD;  Location: [facility];  Service: Gastroenterology;  Laterality: N/A;  . ESOPHAGOGASTRODUODENOSCOPY    . ESOPHAGOGASTRODUODENOSCOPY (EGD) WITH PROPOFOL N/A 11/11/2019   Procedure: ESOPHAGOGASTRODUODENOSCOPY (EGD) WITH PROPOFOL;  Surgeon: Yetta Flock, MD;  Location: [facility];  Service: Gastroenterology;  Laterality: N/A;  . FEMORAL ENDARTERECTOMY Left 01/23/12   Left Endarterectomy  with bovie patch Angioplasy  . gated spect wall motion stress cardiolite  02/10/2002  . HEMOSTASIS CLIP PLACEMENT  11/11/2019   Procedure: HEMOSTASIS CLIP PLACEMENT;  Surgeon: Yetta Flock, MD;  Location: [facility];  Service: Gastroenterology;;  . HEMOSTASIS CONTROL  11/11/2019   Procedure: HEMOSTASIS CONTROL;  Surgeon: Yetta Flock, MD;  Location: [facility];  Service: Gastroenterology;;  . HIP FRACTURE SURGERY Right 1990's  . HOT HEMOSTASIS N/A 11/11/2019   Procedure: HOT HEMOSTASIS (ARGON PLASMA COAGULATION/BICAP);  Surgeon: Yetta Flock, MD;  Location: [facility] ENDOSCOPY;  Service: Gastroenterology;  Laterality: N/A;  . INGUINAL HERNIA REPAIR Right    "years ago" (10/13/2012)  . LUMBAR LAMINECTOMY/DECOMPRESSION MICRODISCECTOMY N/A 05/23/2017   Procedure: L4-5 DECOMPRESSION;  Surgeon: Marybelle Killings, MD;  Location: [facility];  Service: Orthopedics;  Laterality: N/A;  . NOSE SURGERY    . POLYPECTOMY  11/11/2019   Procedure: POLYPECTOMY;  Surgeon: Yetta Flock, MD;  Location[facility] ENDOSCOPY;  Service: Gastroenterology;;  . TONSILLECTOMY     "I was a chld" (10/13/2012)       Family History  Problem Relation Age of Onset  . Hypertension Mother   .  Heart disease Mother   . Hypertension Father   . Heart disease Father   . Hypertension Sister   . Hypertension Sister   . Colon cancer Neg Hx   . Esophageal cancer Neg Hx   . Pancreatic cancer Neg Hx   . Stomach cancer Neg Hx     Social History   Tobacco Use  . Smoking status: Current Every Day Smoker    Packs/day: 0.25    Years: 57.00    Pack years: 14.25    Types: Cigarettes  . Smokeless tobacco: Never Used  . Tobacco comment: pt states there is no hope for quitting, pt states he smokes 1-2 cigarettes a day, wife has copd and doesnt smoke around her  Vaping Use  . Vaping Use: Never used  Substance Use Topics  . Alcohol use: Not Currently  . Drug use: No    Home Medications Prior to Admission medications   Medication Sig Start Date End Date Taking? Authorizing Provider  magnesium citrate SOLN Take 296 mLs (1 Bottle total) by mouth once for 1 dose. 08/05/20 08/05/20 Yes Beatris Belen, Junie Panning, MD  senna-docusate (SENOKOT-S) 8.6-50 MG tablet Take 2 tablets by mouth 2 (two) times daily as needed for up to 7 days for mild constipation. 08/05/20  08/12/20 Yes Gareth Morgan, MD  acetaminophen (TYLENOL) 500 MG tablet Take 500 mg by mouth every 6 (six) hours as needed for mild pain or headache.    [provider]  amLODipine (NORVASC) 10 MG tablet Take 1 tablet (10 mg total) by mouth daily. 02/18/20   Luetta Nutting, DO  ferrous sulfate 325 (65 FE) MG tablet Take 1 tablet (325 mg total) by mouth daily with breakfast. 02/10/20 05/10/20  Luetta Nutting, DO  HYDROcodone-acetaminophen (NORCO/VICODIN) 5-325 MG tablet Take 1 tablet by mouth every 6 (six) hours as needed for moderate pain. 08/01/20   Marybelle Killings, MD  levothyroxine (SYNTHROID) 75 MCG tablet TAKE 1 TABLET(75 MCG) BY MOUTH DAILY BEFORE BREAKFAST 07/31/20   Luetta Nutting, DO  losartan (COZAAR) 100 MG tablet TAKE 1 TABLET(100 MG) BY MOUTH DAILY 06/26/20   Luetta Nutting, DO  methocarbamol (ROBAXIN) 500 MG tablet Take 1 tablet (500  mg total) by mouth 2 (two) times daily as needed for muscle spasms. 07/23/20   Jaynee Eagles, PA-C  pantoprazole (PROTONIX) 40 MG tablet Take 1 tablet (40 mg total) by mouth daily. 01/05/20 01/04/21  Ladene Artist, MD  polyethylene glycol (MIRALAX / GLYCOLAX) 17 g packet Take 17 g by mouth daily as needed for up to 20 days. 08/02/20 08/22/20  Curatolo, Adam, DO  tamsulosin (FLOMAX) 0.4 MG CAPS capsule Take 1 capsule (0.4 mg total) by mouth daily. 07/23/20   Jaynee Eagles, PA-C  traMADol (ULTRAM) 50 MG tablet Take 1 tablet (50 mg total) by mouth every 6 (six) hours as needed. 07/27/20   Marybelle Killings, MD    Allergies    Hydrocodone-acetaminophen, Atorvastatin, Other, Penicillins, Pioglitazone, and Vicodin [hydrocodone-acetaminophen]  Review of Systems   Review of Systems  Constitutional: Negative for fever.  Gastrointestinal: Positive for constipation. Negative for abdominal pain, diarrhea, nausea and vomiting.  Genitourinary: Negative for dysuria.  Musculoskeletal: Positive for back pain.    Physical Exam Updated Vital Signs BP (!) 142/86 (BP Location: Right Arm)   Pulse 99   Temp 98.1 F (36.7 C) (Oral)   Resp 18   Ht 5\' 7"  (1.702 m)   Wt 62.1 kg   SpO2 99%   BMI 21.46 kg/m   Physical Exam Vitals and nursing note reviewed.  Constitutional:      General: He is not in acute distress.    Appearance: Normal appearance. He is not ill-appearing, toxic-appearing or diaphoretic.  HENT:     Head: Normocephalic.  Eyes:     Conjunctiva/sclera: Conjunctivae normal.  Cardiovascular:     Rate and Rhythm: Normal rate and regular rhythm.     Pulses: Normal pulses.  Pulmonary:     Effort: Pulmonary effort is normal. No respiratory distress.  Abdominal:     General: Abdomen is flat. Bowel sounds are normal.     Palpations: Abdomen is soft.     Tenderness: There is no abdominal tenderness.  Genitourinary:    Rectum: Normal.  Musculoskeletal:        General: No deformity or signs of injury.      Cervical back: No rigidity.  Skin:    General: Skin is warm and dry.     Coloration: Skin is not jaundiced or pale.  Neurological:     General: No focal deficit present.     Mental Status: He is alert and oriented to person, place, and time.     ED Results / Procedures / Treatments   Labs (all labs ordered are listed, but  only abnormal results are displayed) Labs Reviewed - No data to display  EKG None  Radiology No results found.  Procedures Procedures   Medications Ordered in ED Medications  lidocaine (XYLOCAINE) 2 % jelly 1 application (1 application Topical Given 08/05/20 0726)    ED Course  I have reviewed the triage vital signs and the nursing notes.  Pertinent labs & imaging results that were available during my care of the patient were reviewed by me and considered in my medical decision making (see chart for details).    MDM Rules/Calculators/A&P                          80 year old male with history significant for L1 fracture diagnosed on March 6 while lifting a recliner followed by Dr. Lorin Mercy, on hydrocodone, with new development of constipation since he developed fracture, evaluation in the emergency department 3 days ago for constipation at which time he had a CT which showed stable intra-abdominal aneurysms, no signs of bowel obstruction or other acute abnormalities, who presents with concern for continuing constipation.  Reviewed imaging results from days ago, given no new abdominal pain, nausea, vomiting, fever or other concerns have low suspicion for bowel obstruction, perforation or other acute abnormalities.  Symptoms and exam consistent with continuing constipation.  Attempted fecal disimpaction however small stool rolling away and no significant impaction on exam.  Discussed more aggressive constipation regimen in detail. Discussed importance of hydration, diet and recommended miralax taper (4 caps in 1 32oz bottle day 1), senokot and colace and given rx  for magnesium citrate if it doesn't improve. Patient discharged in stable condition with understanding of reasons to return.     Final Clinical Impression(s) / ED Diagnoses Final diagnoses:  Drug-induced constipation    Rx / DC Orders ED Discharge Orders         Ordered    senna-docusate (SENOKOT-S) 8.6-50 MG tablet  2 times daily PRN        08/05/20 0743    magnesium citrate SOLN   Once        08/05/20 9563           Gareth Morgan, MD 08/05/20 [phone]

## 2020-08-05 NOTE — ED Triage Notes (Signed)
  Patient comes in with lower abdominal pain and rectal pain.  Was seen on Wednesday and diagnosed with constipation and has been taking miralax at home.  Patient states he has had diarrhea and feels like there is a big hard ball of stool at his rectum.  Patient has compression fracture in lower back preventing him from being able to push out bowel movement.  Pain 8/10.  Pressure in lower back.

## 2020-08-07 ENCOUNTER — Other Ambulatory Visit: Payer: Self-pay

## 2020-08-07 MED ORDER — METHOCARBAMOL 500 MG PO TABS: 500.0000 mg | ORAL_TABLET | Freq: Two times a day (BID) | ORAL | 0 refills | Status: DC | PRN

## 2020-08-07 NOTE — Telephone Encounter (Signed)
Pt called requesting a refill on his methocarbamol

## 2020-08-17 ENCOUNTER — Other Ambulatory Visit: Payer: Self-pay | Admitting: Orthopaedic Surgery

## 2020-08-17 ENCOUNTER — Telehealth: Payer: Self-pay | Admitting: Orthopaedic Surgery

## 2020-08-17 MED ORDER — TRAMADOL HCL 50 MG PO TABS: 50.0000 mg | ORAL_TABLET | Freq: Four times a day (QID) | ORAL | 0 refills | Status: DC | PRN

## 2020-08-17 NOTE — Telephone Encounter (Signed)
Please advise 

## 2020-08-17 NOTE — Telephone Encounter (Signed)
I called sent in tramadol

## 2020-08-17 NOTE — Telephone Encounter (Signed)
Patient called needing Rx refilled Tramadol. The number to contact patient is [phone]

## 2020-09-05 ENCOUNTER — Ambulatory Visit: Payer: Medicare Other | Admitting: Orthopaedic Surgery

## 2020-09-06 ENCOUNTER — Other Ambulatory Visit: Payer: Self-pay | Admitting: Orthopaedic Surgery

## 2020-09-06 DIAGNOSIS — Z681 Body mass index (BMI) 19 or less, adult: Secondary | ICD-10-CM | POA: Diagnosis not present

## 2020-09-06 DIAGNOSIS — M2550 Pain in unspecified joint: Secondary | ICD-10-CM | POA: Diagnosis not present

## 2020-09-06 DIAGNOSIS — M0579 Rheumatoid arthritis with rheumatoid factor of multiple sites without organ or systems involvement: Secondary | ICD-10-CM | POA: Diagnosis not present

## 2020-09-06 DIAGNOSIS — R5382 Chronic fatigue, unspecified: Secondary | ICD-10-CM | POA: Diagnosis not present

## 2020-09-13 ENCOUNTER — Other Ambulatory Visit: Payer: Self-pay | Admitting: Family Medicine

## 2020-09-16 IMAGING — DX DG CHEST 1V PORT
1 series · 1 of 1 positions shown · non-contrast
Comparison: 12/17/2018

CLINICAL DATA: Cough, progressive weakness, symptoms for 6 weeks

EXAM:
PORTABLE CHEST 1 VIEW

[chest]
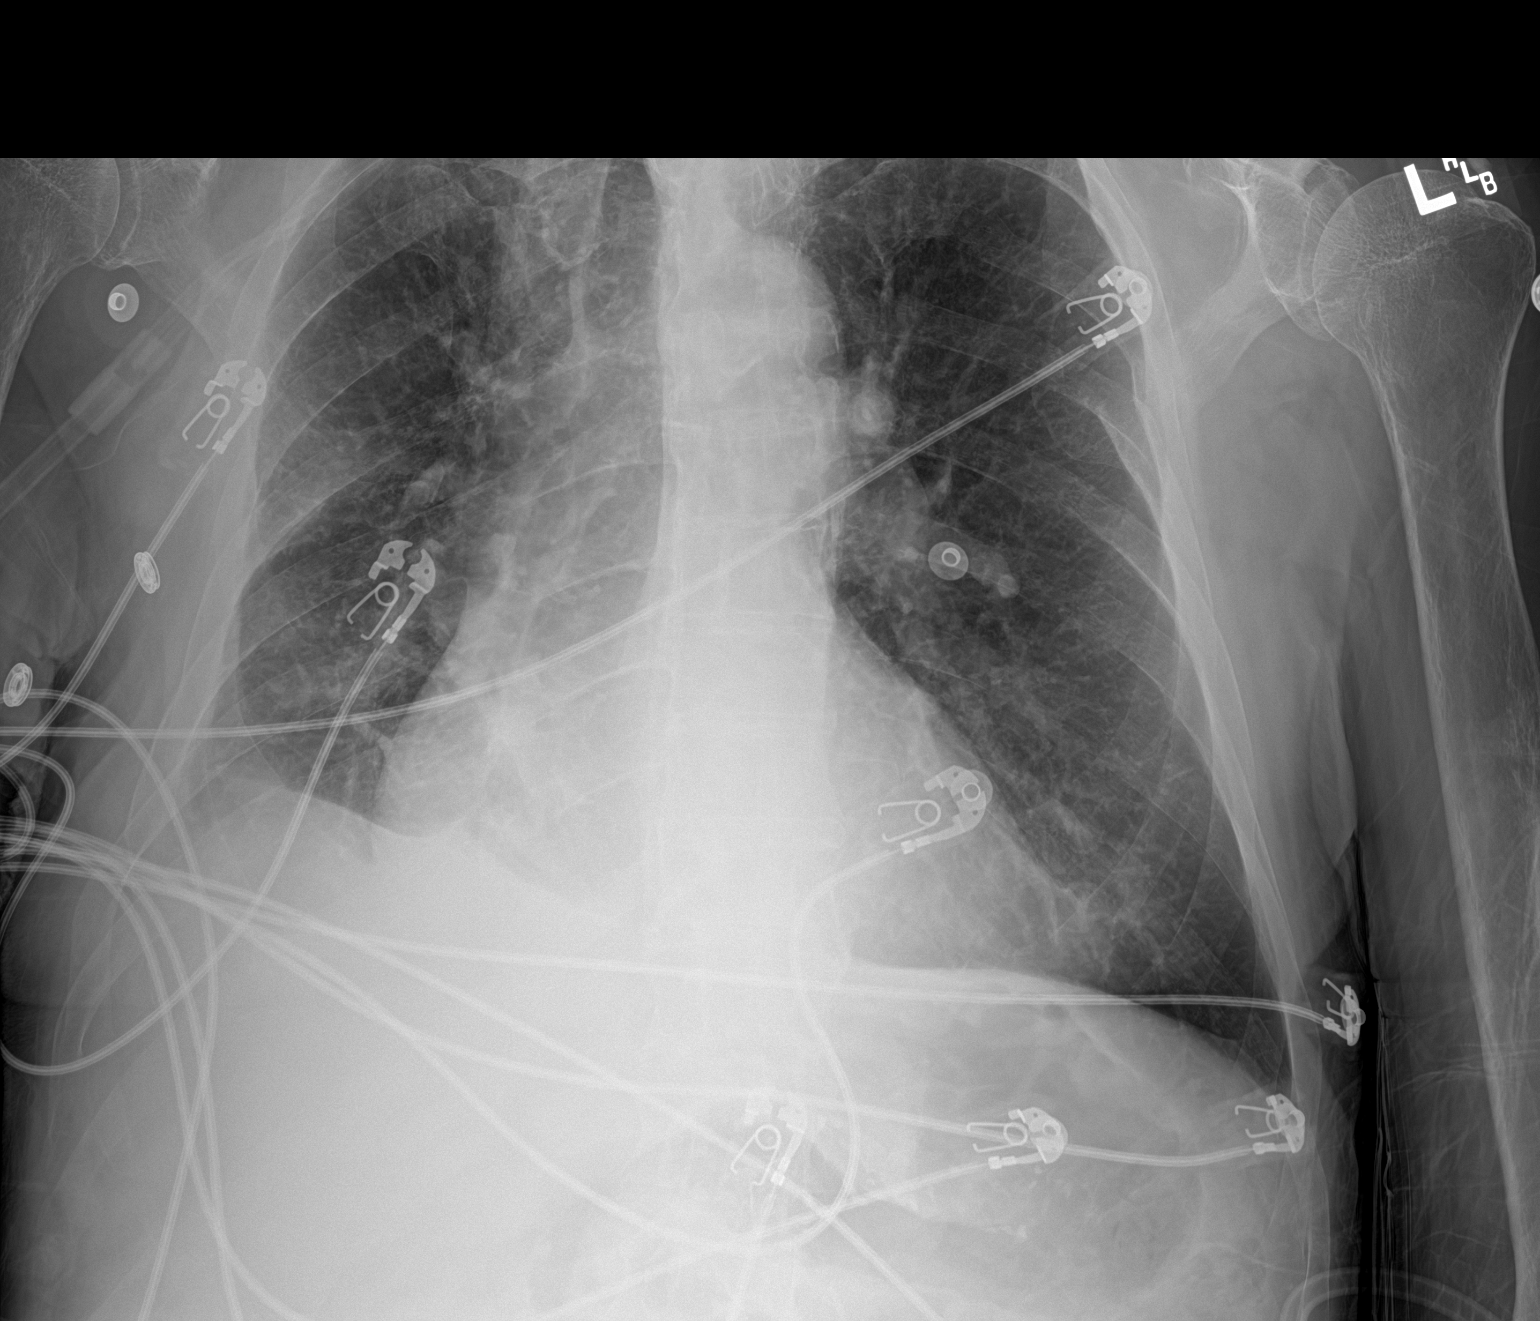

[1 of 1 positions shown; findings below may reference images not displayed]

FINDINGS: Single frontal view of the chest demonstrates an unremarkable
cardiac silhouette. There is right basilar consolidation and small
right pleural effusion. No pneumothorax. Left chest is clear. No
acute bony abnormalities.
IMPRESSION: 1. Right basilar consolidation and small right pleural effusion.
Findings could reflect bronchopneumonia. Followup PA and lateral
chest X-ray is recommended in 3-4 weeks following trial of
antibiotic therapy to ensure resolution.

## 2020-09-19 ENCOUNTER — Telehealth: Payer: Self-pay | Admitting: Orthopaedic Surgery

## 2020-09-19 ENCOUNTER — Other Ambulatory Visit: Payer: Self-pay | Admitting: Orthopaedic Surgery

## 2020-09-19 MED ORDER — TRAMADOL HCL 50 MG PO TABS: ORAL_TABLET | ORAL | 0 refills | Status: DC

## 2020-09-19 NOTE — Telephone Encounter (Signed)
Please advise 

## 2020-09-19 NOTE — Telephone Encounter (Signed)
Ucall. I sent in last refill for him. Final # 15 tabs . thanks

## 2020-09-19 NOTE — Telephone Encounter (Signed)
Patient called needing Rx refilled Tramadol. The number to contact patient is [phone]

## 2020-09-20 NOTE — Telephone Encounter (Signed)
Called pt and informed. PT stated understanding

## 2020-09-30 ENCOUNTER — Other Ambulatory Visit: Payer: Self-pay | Admitting: Family Medicine

## 2020-10-24 ENCOUNTER — Other Ambulatory Visit: Payer: Self-pay | Admitting: Family Medicine

## 2020-10-26 DIAGNOSIS — D485 Neoplasm of uncertain behavior of skin: Secondary | ICD-10-CM | POA: Diagnosis not present

## 2020-10-26 DIAGNOSIS — L570 Actinic keratosis: Secondary | ICD-10-CM | POA: Diagnosis not present

## 2020-10-26 DIAGNOSIS — C44229 Squamous cell carcinoma of skin of left ear and external auricular canal: Secondary | ICD-10-CM | POA: Diagnosis not present

## 2020-11-06 ENCOUNTER — Other Ambulatory Visit: Payer: Self-pay

## 2020-11-06 DIAGNOSIS — R5382 Chronic fatigue, unspecified: Secondary | ICD-10-CM | POA: Diagnosis not present

## 2020-11-06 DIAGNOSIS — D500 Iron deficiency anemia secondary to blood loss (chronic): Secondary | ICD-10-CM

## 2020-11-06 DIAGNOSIS — Z681 Body mass index (BMI) 19 or less, adult: Secondary | ICD-10-CM | POA: Diagnosis not present

## 2020-11-06 DIAGNOSIS — E559 Vitamin D deficiency, unspecified: Secondary | ICD-10-CM | POA: Diagnosis not present

## 2020-11-06 DIAGNOSIS — M2550 Pain in unspecified joint: Secondary | ICD-10-CM | POA: Diagnosis not present

## 2020-11-06 DIAGNOSIS — E538 Deficiency of other specified B group vitamins: Secondary | ICD-10-CM | POA: Diagnosis not present

## 2020-11-06 DIAGNOSIS — M0579 Rheumatoid arthritis with rheumatoid factor of multiple sites without organ or systems involvement: Secondary | ICD-10-CM | POA: Diagnosis not present

## 2020-11-06 MED ORDER — FERROUS SULFATE 325 (65 FE) MG PO TABS: 325.0000 mg | ORAL_TABLET | Freq: Every day | ORAL | 2 refills | Status: DC

## 2020-11-22 DIAGNOSIS — D0422 Carcinoma in situ of skin of left ear and external auricular canal: Secondary | ICD-10-CM | POA: Diagnosis not present

## 2020-11-29 ENCOUNTER — Telehealth: Payer: Self-pay | Admitting: Family Medicine

## 2020-11-29 NOTE — Chronic Care Management (AMB) (Signed)
  Chronic Care Management   Note  11/29/2020 Name: HENOK HEACOCK MRN: [id number] DOB: 1941/03/02  Caprice Renshaw Singleton is a 80 y.o. year old male who is a primary care patient of Luetta Nutting, DO. I reached out to Cecil Cranker by phone today in response to a referral sent by Mr. Tavaras Goody Safranek's PCP, Luetta Nutting, DO.   Mr. Docken was given information about Chronic Care Management services today including:  CCM service includes personalized support from designated clinical staff supervised by his physician, including individualized plan of care and coordination with other care providers 24/7 contact phone numbers for assistance for urgent and routine care needs. Service will only be billed when office clinical staff spend 20 minutes or more in a month to coordinate care. Only one practitioner may furnish and bill the service in a calendar month. The patient may stop CCM services at any time (effective at the end of the month) by phone call to the office staff.   Patient agreed to services and verbal consent obtained.   Follow up plan:   Lauretta Grill [employer] Scheduler

## 2020-12-21 ENCOUNTER — Other Ambulatory Visit: Payer: Self-pay | Admitting: Family Medicine

## 2020-12-21 NOTE — Telephone Encounter (Signed)
Please contact patient to schedule follow-up appt for hypertension.   Sent 30 day refill of med.

## 2020-12-21 NOTE — Telephone Encounter (Signed)
Pt scheduled for 08-29

## 2020-12-29 ENCOUNTER — Telehealth: Payer: Self-pay | Admitting: Pharmacist

## 2020-12-29 NOTE — Chronic Care Management (AMB) (Signed)
    Chronic Care Management Pharmacy Assistant   Name: Ronnie Snyder  MRN: [id number] DOB: [birth year]  Ronnie Snyder is an 80 y.o. year old male who presents for his initial CCM visit with the clinical pharmacist.   Recent office visits:  None noted  Recent consult visits:  08/01/20-Mark C. Lorin Mercy, MD (Orthopedics) Seen for compression fracture. X-ray ordered. Follow up in 1 month.  Hospital visits:  Medication Reconciliation was completed by comparing discharge summary, patient's EMR and Pharmacy list, and upon discussion with patient.  Admitted to the hospital on 08/05/20 due to Abdominal Pain. Discharge date was 08/05/20. Discharged from [facility]?Medications Started at [facility] Discharge:?? -started senna-docusate (SENOKOT-S) 8.6-50 MG tablet  2 times daily PRN and Magnesium Citrate soln  Medication Changes at Hospital Discharge: -Changed None noted  Medications Discontinued at Hospital Discharge: -Stopped None noted   Medication Reconciliation was completed by comparing discharge summary, patient's EMR and Pharmacy list, and upon discussion with patient.  Admitted to the hospital on 08/02/20 due to Constipation. Discharge date was 08/02/20. Discharged from [facility]?Medications Started at [facility] Discharge:?? -started None noted  Medication Changes at Hospital Discharge: -Changed None noted  Medications Discontinued at Hospital Discharge: -Stopped None noted  Medications that remain the same after Hospital Discharge:??  -All other medications will remain the same.    Medications that remain the same after Hospital Discharge:??  -All other medications will remain the same.     Medications: Outpatient Encounter Medications as of 12/29/2020  Medication Sig   acetaminophen (TYLENOL) 500 MG tablet Take 500 mg by mouth every 6 (six) hours as needed for mild pain or headache.   amLODipine  (NORVASC) 10 MG tablet TAKE 1 TABLET(10 MG) BY MOUTH DAILY   ferrous sulfate 325 (65 FE) MG tablet Take 1 tablet (325 mg total) by mouth daily with breakfast.   HYDROcodone-acetaminophen (NORCO/VICODIN) 5-325 MG tablet Take 1 tablet by mouth every 6 (six) hours as needed for moderate pain.   levothyroxine (SYNTHROID) 75 MCG tablet TAKE 1 TABLET(75 MCG) BY MOUTH DAILY BEFORE BREAKFAST   losartan (COZAAR) 100 MG tablet TAKE 1 TABLET(100 MG) BY MOUTH DAILY   methocarbamol (ROBAXIN) 500 MG tablet Take 1 tablet (500 mg total) by mouth 2 (two) times daily as needed for muscle spasms.   pantoprazole (PROTONIX) 40 MG tablet Take 1 tablet (40 mg total) by mouth daily.   tamsulosin (FLOMAX) 0.4 MG CAPS capsule TAKE 1 CAPSULE(0.4 MG) BY MOUTH DAILY   traMADol (ULTRAM) 50 MG tablet TAKE 1 TABLET(50 MG) BY MOUTH EVERY 6 HOURS AS NEEDED   No facility-administered encounter medications on file as of 12/29/2020.   Acetaminophen (TYLENOL) 500 MG tablet Last filled:None noted AmLODipine (NORVASC) 10 MG tablet Last filled:06/13/20 90 DS Ferrous sulfate 325 (65 FE) MG tablet Last filled:05/07/20 90 DS HYDROcodone-acetaminophen (NORCO/VICODIN) 5-325 MG tablet Last filled:08/01/20 5 DS Levothyroxine (SYNTHROID) 75 MCG tablet Last filled:07/31/20 90 DS Losartan (COZAAR) 100 MG tablet Last filled:06/26/20 90 DS Methocarbamol (ROBAXIN) 500 MG tablet Last filled:07/23/20 15 DS Pantoprazole (PROTONIX) 40 MG tablet Last filled:07/03/20 90 DS Tamsulosin (FLOMAX) 0.4 MG CAPS capsule Last filled:06/29/20 90 DS TraMADol (ULTRAM) 50 MG tablet Last filled:07/27/20 3 DS  Star Rating Drugs: Losartan (COZAAR) 100 MG tablet Last filled:06/26/20 90 DS  Myriam Elta [place], [place]

## 2020-12-30 ENCOUNTER — Telehealth: Payer: Self-pay | Admitting: Family Medicine

## 2021-01-01 ENCOUNTER — Other Ambulatory Visit: Payer: Self-pay | Admitting: Gastroenterology

## 2021-01-01 NOTE — Telephone Encounter (Signed)
Please contact patient for appointment. Pt has not been seen since 05/2020.  Sending 30 day refill.   Thanks

## 2021-01-01 NOTE — Telephone Encounter (Signed)
Patient has an appointment already. Does not need anything else.

## 2021-01-08 ENCOUNTER — Other Ambulatory Visit: Payer: Self-pay | Admitting: Family Medicine

## 2021-01-08 DIAGNOSIS — E039 Hypothyroidism, unspecified: Secondary | ICD-10-CM

## 2021-01-10 ENCOUNTER — Other Ambulatory Visit: Payer: Self-pay

## 2021-01-10 ENCOUNTER — Ambulatory Visit (INDEPENDENT_AMBULATORY_CARE_PROVIDER_SITE_OTHER): Payer: Medicare Other | Admitting: Pharmacist

## 2021-01-10 VITALS — BP 118/62 | HR 70

## 2021-01-10 DIAGNOSIS — E7800 Pure hypercholesterolemia, unspecified: Secondary | ICD-10-CM

## 2021-01-10 DIAGNOSIS — I10 Essential (primary) hypertension: Secondary | ICD-10-CM

## 2021-01-10 DIAGNOSIS — M545 Low back pain, unspecified: Secondary | ICD-10-CM

## 2021-01-10 NOTE — Progress Notes (Signed)
Chronic Care Management Pharmacy Note  01/11/2021 Name:  Ronnie Snyder MRN:  [id number] DOB:  Mar 16, 1941  Summary: addressed HTN, HLD, chronic pain  Recommendations/Changes made from today's visit: - Recommended check BP 1-2x per week at home to discuss at next visit (patient has goal to decrease BP medicine but would like to see home numbers prior to making deescalation decisions) - Recommended obtain annual lipid panel at next PCP visit, possibly consider low dose rosuvastatin if LDL remains >100 at next check.  Plan: f/u with pharmacist in 1 month  Subjective: Ronnie Snyder is an 80 y.o. year old male who is a primary patient of Luetta Nutting, DO.  The CCM team was consulted for assistance with disease management and care coordination needs.    Engaged with patient face to face for initial visit in response to provider referral for pharmacy case management and/or care coordination services.   Consent to Services:  The patient was given information about Chronic Care Management services, agreed to services, and gave verbal consent prior to initiation of services.  Please see initial visit note for detailed documentation.   Patient Care Team: Luetta Nutting, DO as PCP - General (Family Medicine) Sherren Mocha, MD (Cardiology) [place], Romilda Garret, [place] (Podiatry) Serafina Mitchell, MD as Attending Physician (Vascular Surgery) Warden Fillers, MD as Consulting Physician (Ophthalmology) Darius Bump, [facility] as Pharmacist (Pharmacist)  Recent office visits:  None noted   Recent consult visits:  08/01/20-Mark C. Lorin Mercy, MD (Orthopedics) Seen for compression fracture. X-ray ordered. Follow up in 1 month.   Hospital visits:  Medication Reconciliation was completed by comparing discharge summary, patient's EMR and Pharmacy list, and upon discussion with patient.   Admitted to the hospital on 08/05/20 due to Abdominal Pain. Discharge date was 08/05/20. Discharged from  [place]?Medications Started at [facility] Discharge:?? -started senna-docusate (SENOKOT-S) 8.6-50 MG tablet  2 times daily PRN and Magnesium Citrate soln   Medication Changes at Hospital Discharge: -Changed None noted   Medications Discontinued at Hospital Discharge: -Stopped None noted     Medication Reconciliation was completed by comparing discharge summary, patient's EMR and Pharmacy list, and upon discussion with patient.   Admitted to the hospital on 08/02/20 due to Constipation. Discharge date was 08/02/20. Discharged from [place]?Medications Started at [facility] Discharge:?? -started None noted   Medication Changes at Hospital Discharge: -Changed None noted   Medications Discontinued at Hospital Discharge: -Stopped None noted   Medications that remain the same after Hospital Discharge:??  -All other medications will remain the same.     Medications that remain the same after Hospital Discharge:??  -All other medications will remain the same.    Objective:  Lab Results  Component Value Date   CREATININE 0.52 (L) 08/02/2020   CREATININE 0.55 (L) 05/30/2020   CREATININE 0.57 (L) 01/04/2020    Lab Results  Component Value Date   HGBA1C 5.7 (H) 08/12/2019   Last diabetic Eye exam: No results found for: HMDIABEYEEXA  Last diabetic Foot exam: No results found for: HMDIABFOOTEX      Component Value Date/Time   CHOL 181 01/04/2020 1011   TRIG 175 (H) 01/04/2020 1011   HDL 44 01/04/2020 1011   CHOLHDL 4.1 01/04/2020 1011   VLDL 12 08/12/2019 0412   LDLCALC 108 (H) 01/04/2020 1011   LDLDIRECT 169.0 10/03/2014 0907    Hepatic Function Latest Ref Rng & Units 05/30/2020 01/04/2020 11/10/2019  Total  Protein 6.1 - 8.1 g/dL 6.5 6.5 5.8(L)  Albumin 3.5 - 5.0 g/dL - - 3.6  AST 10 - 35 U/L 13 16 14(L)  ALT 9 - 46 U/L 10 10 13   Alk Phosphatase 38 - 126 U/L - - 79  Total Bilirubin 0.2 - 1.2 mg/dL 0.4 0.4  0.9  Bilirubin, Direct 0.0 - 0.3 mg/dL - - -    Lab Results  Component Value Date/Time   TSH 3.78 01/04/2020 10:11 AM   TSH 2.158 08/10/2019 06:27 AM   TSH 4.26 08/27/2018 02:41 PM    CBC Latest Ref Rng & Units 08/02/2020 05/30/2020 01/04/2020  WBC 4.0 - 10.5 K/uL 5.7 9.3 6.8  Hemoglobin 13.0 - 17.0 g/dL 12.5(L) 12.0(L) 12.5(L)  Hematocrit 39.0 - 52.0 % 36.5(L) 34.7(L) 39.2  Platelets 150 - 400 K/uL 246 275 156    No results found for: VD25OH  Clinical ASCVD: Yes  The ASCVD Risk score Mikey Bussing DC Jr., et al., 2013) failed to calculate for the following reasons:   The patient has a prior MI or stroke diagnosis    Other: (CHADS2VASc if Afib, PHQ9 if depression, MMRC or CAT for COPD, ACT, DEXA)  Social History   Tobacco Use  Smoking Status Every Day   Packs/day: 0.25   Years: 57.00   Pack years: 14.25   Types: Cigarettes  Smokeless Tobacco Never  Tobacco Comments   pt states there is no hope for quitting, pt states he smokes 1-2 cigarettes a day, wife has copd and doesnt smoke around her   BP Readings from Last 3 Encounters:  01/11/21 118/62  08/05/20 (!) 142/86  08/02/20 (!) 175/103   Pulse Readings from Last 3 Encounters:  01/11/21 70  08/05/20 99  08/02/20 85   Wt Readings from Last 3 Encounters:  08/05/20 137 lb (62.1 kg)  08/01/20 137 lb (62.1 kg)  05/30/20 134 lb 1.6 oz (60.8 kg)    Assessment: Review of patient past medical history, allergies, medications, health status, including review of consultants reports, laboratory and other test data, was performed as part of comprehensive evaluation and provision of chronic care management services.   SDOH:  (Social Determinants of Health) assessments and interventions performed:    CCM Care Plan  Allergies  Allergen Reactions   Hydrocodone-Acetaminophen Nausea Only    Vertigo, also   Atorvastatin Nausea Only   Other Nausea Only and Other (See Comments)    Vertigo and nausea: "ALL PAINS/NARCOTIC MEDS-CANNOT  TOLERATE WELL,"  per Med History prior to 05/23/17   Penicillins Rash    Has patient had a PCN reaction causing immediate rash, facial/tongue/throat swelling, SOB or lightheadedness with hypotension: Unknown Has patient had a PCN reaction causing severe rash involving mucus membranes or skin necrosis: No Has patient had a PCN reaction that required hospitalization: No Has patient had a PCN reaction occurring within the last 10 years: No If all of the above answers are "NO", then may proceed with Cephalosporin use.    Pioglitazone Nausea Only   Vicodin [Hydrocodone-Acetaminophen] Nausea Only    Medications Reviewed Today     Reviewed by Darius Bump, [facility] (Pharmacist) on 01/10/21 at 23  Med List Status: <None>   Medication Order Taking? Sig Documenting Provider Last Dose Status Informant  acetaminophen (TYLENOL) 500 MG tablet [id number] Yes Take 500 mg by mouth every 6 (six) hours as needed for mild pain or headache. [provider] Taking Active Self  amLODipine (NORVASC) 10 MG tablet [id number] Yes TAKE 1 TABLET(10  MG) BY MOUTH DAILY Luetta Nutting, DO Taking Active   ferrous sulfate 325 (65 FE) MG tablet [id number] Yes Take 1 tablet (325 mg total) by mouth daily with breakfast. Luetta Nutting, DO Taking Active   folic acid (FOLVITE) 1 MG tablet [id number] Yes Take 1 mg by mouth daily. [provider] Taking Active   HYDROcodone-acetaminophen (NORCO/VICODIN) 5-325 MG tablet [id number] No Take 1 tablet by mouth every 6 (six) hours as needed for moderate pain.  Patient not taking: Reported on 01/10/2021   Marybelle Killings, MD Not Taking Active   levothyroxine (SYNTHROID) 75 MCG tablet [id number] Yes Take 1 tablet (75 mcg total) by mouth daily before breakfast. Luetta Nutting, DO Taking Active   losartan (COZAAR) 100 MG tablet [id number] Yes TAKE 1 TABLET(100 MG) BY MOUTH DAILY Hali Marry, MD Taking Active   methocarbamol (ROBAXIN) 500 MG tablet [id number] No Take 1 tablet  (500 mg total) by mouth 2 (two) times daily as needed for muscle spasms.  Patient not taking: Reported on 01/10/2021   Marybelle Killings, MD Not Taking Active   methotrexate [facility]) 2.5 MG tablet [id number] Yes Take 15 mg by mouth once a week. Caution:Chemotherapy. Protect from light. [provider] Taking Active   OVER THE COUNTER MEDICATION [id number] Yes Take 1 capsule by mouth daily. "Nerve Control 911" contains passion flower herb, marshmallow root, corydalis powder, prickly pear, [place] poppy seed. [provider] Taking Active   pantoprazole (PROTONIX) 40 MG tablet [id number] Yes TAKE 1 TABLET(40 MG) BY MOUTH DAILY Ladene Artist, MD Taking Active   tamsulosin (FLOMAX) 0.4 MG CAPS capsule [id number] Yes TAKE 1 CAPSULE(0.4 MG) BY MOUTH DAILY Luetta Nutting, DO Taking Active   traMADol (ULTRAM) 50 MG tablet [id number] No TAKE 1 TABLET(50 MG) BY MOUTH EVERY 6 HOURS AS NEEDED  Patient not taking: Reported on 01/10/2021   Marybelle Killings, MD Not Taking Active             Patient Active Problem List   Diagnosis Date Noted   Fatigue 05/30/2020   Benign neoplasm of colon    Gastric AVM    Iron deficiency anemia due to chronic blood loss 11/09/2019   Barrett's esophagus without dysplasia 08/30/2019   Steatosis of liver 08/19/2019   GI bleed 08/18/2019   Cerebral thrombosis with cerebral infarction 08/12/2019   Symptomatic anemia 08/09/2019   Acute respiratory failure with hypoxia ([place]) 08/09/2019   Urinary hesitancy 10/26/2018   Night sweats 10/26/2018   Other specified hypothyroidism 05/29/2018   Lumbar stenosis 05/23/2017   Senile purpura ([place]) 09/23/2013   PAD (peripheral artery disease) ([place]) 08/09/2013   Aftercare following surgery of the circulatory system, NEC 02/01/2013   Hyponatremia 08/21/2012   Smoker 08/13/2012   Other and unspecified alcohol dependence, unspecified drinking behavior 08/13/2012   Screening for prostate cancer 08/13/2012    Antiplatelet or antithrombotic long-term use 08/13/2012   Atherosclerosis of native artery of extremity with intermittent claudication ([place]) 08/21/2011   ECZEMA 01/24/2010   COUGH 01/24/2009   AAA (abdominal aortic aneurysm) ([place]) 12/06/2008   HYPERCHOLESTEROLEMIA 01/21/2008   ERECTILE DYSFUNCTION, ORGANIC 01/21/2008   Back pain 01/21/2008   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   Anxiety state 03/07/2007   ERECTILE DYSFUNCTION 03/07/2007   DEPRESSION 03/07/2007   HTN (hypertension) 03/07/2007   CAROTID ARTERY STENOSIS 03/07/2007   HIP PAIN, RIGHT 03/07/2007   COLONIC POLYPS, HX OF 03/07/2007   ADENOIDECTOMY, HX OF 03/07/2007    Immunization History  Administered Date(s) Administered   [employer]  Quad(high Dose 65+) 03/02/2019, 02/18/2020   Influenza, High Dose Seasonal PF 02/24/2018   Influenza,inj,Quad PF,6+ Mos 05/07/2017   PFIZER(Purple Top)SARS-COV-2 Vaccination 06/25/2019, 07/20/2019, 03/18/2020   Pneumococcal Conjugate-13 10/03/2015   Pneumococcal Polysaccharide-23 10/03/2004, 12/22/2006, 10/14/2012   Td 10/03/2004   Tdap 05/07/2017    Conditions to be addressed/monitored: HTN, HLD, and chronic pain  Care Plan : Medication Management  Updates made by Darius Bump, [place] since 01/11/2021 12:00 AM     Problem: HTN, HLD, Chronic Pain      Long-Range Goal: Disease Progression Prevention   Start Date: 01/10/2021  This Visit's Progress: On track  Priority: High  Note:   Current Barriers:  None at present  Pharmacist Clinical Goal(s):  Over the next 30 days, patient will adhere to plan to optimize therapeutic regimen for chronic conditions as evidenced by report of adherence to recommended medication management changes through collaboration with PharmD and provider.   Interventions: 1:1 collaboration with Luetta Nutting, DO regarding development and update of comprehensive plan of care as evidenced by provider attestation and co-signature Inter-disciplinary care team collaboration  (see longitudinal plan of care) Comprehensive medication review performed; medication list updated in electronic medical record  Hypertension:  Controlled; current treatment:amlodipine 18m daily, losartan 106mdaily;   Current home readings: not currently checking at home  Denies hypotensive/hypertensive symptoms  Counseled on proper technique to check BP at home Recommended continue current regimen, check BP 1-2x per week at home to discuss at next visit (patient has goal to decrease BP medicine but would like to see home numbers prior to making deescalation decisions),  Hyperlipidemia:  Controlled; current treatment:lifestyle modifications only;   Medications previously tried: atorvastatin - nausea   Recommended obtain annual lipid panel at next PCP visit, possibly consider low dose rosuvastatin if LDL remains >100 at next check. Chronic pain  Controlled; tylenol PRN, nerve control 911 otc medication, methotrexate, PRN tramadol;   Counseled on OTC bowel regimen to prevent future ED visits related to constipation or impaction caused by opioid use, recommend continue current pain regimen  Patient Goals/Self-Care Activities Over the next 30 days, patient will:  take medications as prescribed and check blood pressure 1-2x per week, document, and provide at future appointments  Follow Up Plan: Telephone follow up appointment with care management team member scheduled for:  1 month      Medication Assistance: None required.  Patient affirms current coverage meets needs.  Patient's preferred pharmacy is:  [id number] [address] AT [id number]: [phone]: [phone] ([facility]) - [id number]: [phone]: [phone] pill box? No -  pulls out of bottles one by one and does okay with this Pt endorses 100% compliance  Follow Up:  Patient agrees to Care Plan and Follow-up.  Plan: Telephone follow up appointment with care management team member scheduled for:  1 month  KeDarius Bump

## 2021-01-11 ENCOUNTER — Telehealth: Payer: Medicare Other

## 2021-01-11 NOTE — Patient Instructions (Signed)
Visit Information   PATIENT GOALS:   Goals Addressed             This Visit's Progress    Medication Management       Patient Goals/Self-Care Activities Over the next 30 days, patient will:  take medications as prescribed and check blood pressure 1-2x per week, document, and provide at future appointments  Follow Up Plan: Telephone follow up appointment with care management team member scheduled for:  1 month         Consent to CCM Services: Mr. Charland was given information about Chronic Care Management services including:  CCM service includes personalized support from designated clinical staff supervised by his physician, including individualized plan of care and coordination with other care providers 24/7 contact phone numbers for assistance for urgent and routine care needs. Service will only be billed when office clinical staff spend 20 minutes or more in a month to coordinate care. Only one practitioner may furnish and bill the service in a calendar month. The patient may stop CCM services at any time (effective at the end of the month) by phone call to the office staff. The patient will be responsible for cost sharing (co-pay) of up to 20% of the service fee (after annual deductible is met).  Patient agreed to services and verbal consent obtained.   Patient verbalizes understanding of instructions provided today and agrees to view in [place].   Telephone follow up appointment with care management team member scheduled for: 1 month  [place] CARE PLAN: Patient Care Plan: Medication Management     Problem Identified: HTN, HLD, Chronic Pain      Long-Range Goal: Disease Progression Prevention   Start Date: 01/10/2021  This Visit's Progress: On track  Priority: High  Note:   Current Barriers:  None at present  Pharmacist Clinical Goal(s):  Over the next 30 days, patient will adhere to plan to optimize therapeutic regimen for chronic  conditions as evidenced by report of adherence to recommended medication management changes through collaboration with PharmD and provider.   Interventions: 1:1 collaboration with Luetta Nutting, DO regarding development and update of comprehensive plan of care as evidenced by provider attestation and co-signature Inter-disciplinary care team collaboration (see longitudinal plan of care) Comprehensive medication review performed; medication list updated in electronic medical record  Hypertension:  Controlled; current treatment:amlodipine 52m daily, losartan 1095mdaily;   Current home readings: not currently checking at home  Denies hypotensive/hypertensive symptoms  Counseled on proper technique to check BP at home Recommended continue current regimen, check BP 1-2x per week at home to discuss at next visit (patient has goal to decrease BP medicine but would like to see home numbers prior to making deescalation decisions),  Hyperlipidemia:  Controlled; current treatment:lifestyle modifications only;   Medications previously tried: atorvastatin - nausea   Recommended obtain annual lipid panel at next PCP visit, possibly consider low dose rosuvastatin if LDL remains >100 at next check. Chronic pain  Controlled; tylenol PRN, nerve control 911 otc medication, methotrexate, PRN tramadol;   Counseled on OTC bowel regimen to prevent future ED visits related to constipation or impaction caused by opioid use, recommend continue current pain regimen  Patient Goals/Self-Care Activities Over the next 30 days, patient will:  take medications as prescribed and check blood pressure 1-2x per week, document, and provide at future appointments  Follow Up Plan: Telephone follow up appointment with care management team member scheduled for:  1 month

## 2021-01-15 ENCOUNTER — Other Ambulatory Visit: Payer: Self-pay

## 2021-01-15 ENCOUNTER — Encounter: Payer: Self-pay | Admitting: Family Medicine

## 2021-01-15 ENCOUNTER — Ambulatory Visit (INDEPENDENT_AMBULATORY_CARE_PROVIDER_SITE_OTHER): Payer: Medicare Other

## 2021-01-15 ENCOUNTER — Ambulatory Visit (INDEPENDENT_AMBULATORY_CARE_PROVIDER_SITE_OTHER): Payer: Medicare Other | Admitting: Family Medicine

## 2021-01-15 VITALS — BP 107/61 | HR 80 | Temp 97.7°F | Ht 67.0 in | Wt 113.0 lb

## 2021-01-15 DIAGNOSIS — Z720 Tobacco use: Secondary | ICD-10-CM

## 2021-01-15 DIAGNOSIS — D649 Anemia, unspecified: Secondary | ICD-10-CM

## 2021-01-15 DIAGNOSIS — I10 Essential (primary) hypertension: Secondary | ICD-10-CM

## 2021-01-15 DIAGNOSIS — E038 Other specified hypothyroidism: Secondary | ICD-10-CM

## 2021-01-15 DIAGNOSIS — R5383 Other fatigue: Secondary | ICD-10-CM | POA: Diagnosis not present

## 2021-01-15 DIAGNOSIS — J439 Emphysema, unspecified: Secondary | ICD-10-CM | POA: Diagnosis not present

## 2021-01-15 DIAGNOSIS — R634 Abnormal weight loss: Secondary | ICD-10-CM

## 2021-01-15 DIAGNOSIS — J449 Chronic obstructive pulmonary disease, unspecified: Secondary | ICD-10-CM | POA: Diagnosis not present

## 2021-01-15 DIAGNOSIS — I739 Peripheral vascular disease, unspecified: Secondary | ICD-10-CM

## 2021-01-15 DIAGNOSIS — E7800 Pure hypercholesterolemia, unspecified: Secondary | ICD-10-CM | POA: Diagnosis not present

## 2021-01-15 NOTE — Assessment & Plan Note (Signed)
He does report some increased fatigue.  Some intermittent dark stool which may be related to iron use.  Update CBC today

## 2021-01-15 NOTE — Assessment & Plan Note (Signed)
Significant weight loss since last visit.  Update labs and check chest x-ray today with history of smoking.

## 2021-01-15 NOTE — Assessment & Plan Note (Signed)
Blood pressure is well controlled.  I recommend he continue current antihypertensive regimen.

## 2021-01-15 NOTE — Patient Instructions (Signed)
Stop downstairs and have xray completed today.  Take the elevator down and make a left off the elevator.  We'll be in touch with lab results.

## 2021-01-15 NOTE — Assessment & Plan Note (Signed)
Update TSH today. 
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
## 2021-01-16 LAB — COMPLETE METABOLIC PANEL WITH GFR
AG Ratio: 2 (calc) (ref 1.0–2.5)
ALT: 9 U/L (ref 9–46)
AST: 12 U/L (ref 10–35)
Albumin: 4.1 g/dL (ref 3.6–5.1)
Alkaline phosphatase (APISO): 89 U/L (ref 35–144)
BUN/Creatinine Ratio: 30 (calc) — ABNORMAL HIGH (ref 6–22)
BUN: 14 mg/dL (ref 7–25)
CO2: 33 mmol/L — ABNORMAL HIGH (ref 20–32)
Calcium: 9.3 mg/dL (ref 8.6–10.3)
Chloride: 96 mmol/L — ABNORMAL LOW (ref 98–110)
Creat: 0.47 mg/dL — ABNORMAL LOW (ref 0.70–1.28)
Globulin: 2.1 g/dL (calc) (ref 1.9–3.7)
Glucose, Bld: 92 mg/dL (ref 65–99)
Potassium: 5 mmol/L (ref 3.5–5.3)
Sodium: 134 mmol/L — ABNORMAL LOW (ref 135–146)
Total Bilirubin: 0.3 mg/dL (ref 0.2–1.2)
Total Protein: 6.2 g/dL (ref 6.1–8.1)
eGFR: 106 mL/min/{1.73_m2} (ref >=60)

## 2021-01-16 LAB — LIPID PANEL W/REFLEX DIRECT LDL
Cholesterol: 161 mg/dL (ref <200)
HDL: 53 mg/dL (ref >=40)
LDL Cholesterol (Calc): 88 mg/dL (calc)
Non-HDL Cholesterol (Calc): 108 mg/dL (calc) (ref <130)
Total CHOL/HDL Ratio: 3 (calc) (ref <5.0)
Triglycerides: 105 mg/dL (ref <150)

## 2021-01-16 LAB — CBC WITH DIFFERENTIAL/PLATELET
Absolute Monocytes: 784 {cells}/uL (ref 200–950)
Basophils Absolute: 47 {cells}/uL (ref 0–200)
Basophils Relative: 0.7 %
Eosinophils Absolute: 127 {cells}/uL (ref 15–500)
Eosinophils Relative: 1.9 %
HCT: 35.8 % — ABNORMAL LOW (ref 38.5–50.0)
Hemoglobin: 12.3 g/dL — ABNORMAL LOW (ref 13.2–17.1)
Lymphs Abs: 858 {cells}/uL (ref 850–3900)
MCH: 33.1 pg — ABNORMAL HIGH (ref 27.0–33.0)
MCHC: 34.4 g/dL (ref 32.0–36.0)
MCV: 96.2 fL (ref 80.0–100.0)
MPV: 11.8 fL (ref 7.5–12.5)
Monocytes Relative: 11.7 %
Neutro Abs: 4884 {cells}/uL (ref 1500–7800)
Neutrophils Relative %: 72.9 %
Platelets: 227 10*3/uL (ref 140–400)
RBC: 3.72 Million/uL — ABNORMAL LOW (ref 4.20–5.80)
RDW: 12.9 % (ref 11.0–15.0)
Total Lymphocyte: 12.8 %
WBC: 6.7 10*3/uL (ref 3.8–10.8)

## 2021-01-16 LAB — TSH: TSH: 3.29 m[IU]/L (ref 0.40–4.50)

## 2021-01-24 ENCOUNTER — Other Ambulatory Visit: Payer: Self-pay | Admitting: Family Medicine

## 2021-01-26 ENCOUNTER — Other Ambulatory Visit: Payer: Self-pay | Admitting: Family Medicine

## 2021-01-29 ENCOUNTER — Telehealth: Payer: Self-pay | Admitting: Family Medicine

## 2021-01-29 NOTE — Telephone Encounter (Signed)
Left message for patient to call back and schedule Medicare Annual Wellness Visit (AWV) either virtually or in office.   Last AWV 10/02/17  please schedule at anytime with health coach

## 2021-01-30 ENCOUNTER — Other Ambulatory Visit: Payer: Self-pay | Admitting: Gastroenterology

## 2021-01-30 ENCOUNTER — Other Ambulatory Visit: Payer: Self-pay | Admitting: Family Medicine

## 2021-01-30 ENCOUNTER — Other Ambulatory Visit: Payer: Self-pay

## 2021-01-30 DIAGNOSIS — D500 Iron deficiency anemia secondary to blood loss (chronic): Secondary | ICD-10-CM

## 2021-01-30 MED ORDER — FERROUS SULFATE 325 (65 FE) MG PO TABS: 325.0000 mg | ORAL_TABLET | Freq: Every day | ORAL | 2 refills | Status: DC

## 2021-01-30 MED ORDER — LOSARTAN POTASSIUM 100 MG PO TABS: 100.0000 mg | ORAL_TABLET | Freq: Every day | ORAL | 2 refills | Status: DC

## 2021-01-30 MED ORDER — AMLODIPINE BESYLATE 10 MG PO TABS: 10.0000 mg | ORAL_TABLET | Freq: Every day | ORAL | 2 refills | Status: DC

## 2021-01-31 ENCOUNTER — Encounter: Payer: Self-pay | Admitting: Family Medicine

## 2021-02-04 ENCOUNTER — Other Ambulatory Visit: Payer: Self-pay | Admitting: Family Medicine

## 2021-02-04 DIAGNOSIS — E039 Hypothyroidism, unspecified: Secondary | ICD-10-CM

## 2021-02-05 ENCOUNTER — Other Ambulatory Visit: Payer: Self-pay | Admitting: Family Medicine

## 2021-02-05 DIAGNOSIS — E039 Hypothyroidism, unspecified: Secondary | ICD-10-CM

## 2021-02-07 DIAGNOSIS — Z79899 Other long term (current) drug therapy: Secondary | ICD-10-CM | POA: Diagnosis not present

## 2021-02-07 DIAGNOSIS — M2550 Pain in unspecified joint: Secondary | ICD-10-CM | POA: Diagnosis not present

## 2021-02-07 DIAGNOSIS — R5382 Chronic fatigue, unspecified: Secondary | ICD-10-CM | POA: Diagnosis not present

## 2021-02-07 DIAGNOSIS — Z681 Body mass index (BMI) 19 or less, adult: Secondary | ICD-10-CM | POA: Diagnosis not present

## 2021-02-07 DIAGNOSIS — M0579 Rheumatoid arthritis with rheumatoid factor of multiple sites without organ or systems involvement: Secondary | ICD-10-CM | POA: Diagnosis not present

## 2021-02-12 ENCOUNTER — Telehealth: Payer: Medicare Other

## 2021-02-27 DIAGNOSIS — Z23 Encounter for immunization: Secondary | ICD-10-CM | POA: Diagnosis not present

## 2021-02-28 ENCOUNTER — Other Ambulatory Visit: Payer: Self-pay | Admitting: Gastroenterology

## 2021-03-01 DIAGNOSIS — C44529 Squamous cell carcinoma of skin of other part of trunk: Secondary | ICD-10-CM | POA: Diagnosis not present

## 2021-03-01 DIAGNOSIS — D492 Neoplasm of unspecified behavior of bone, soft tissue, and skin: Secondary | ICD-10-CM | POA: Diagnosis not present

## 2021-03-01 DIAGNOSIS — Z85828 Personal history of other malignant neoplasm of skin: Secondary | ICD-10-CM | POA: Diagnosis not present

## 2021-03-01 DIAGNOSIS — L819 Disorder of pigmentation, unspecified: Secondary | ICD-10-CM | POA: Diagnosis not present

## 2021-03-01 DIAGNOSIS — Z08 Encounter for follow-up examination after completed treatment for malignant neoplasm: Secondary | ICD-10-CM | POA: Diagnosis not present

## 2021-03-01 DIAGNOSIS — L570 Actinic keratosis: Secondary | ICD-10-CM | POA: Diagnosis not present

## 2021-03-09 ENCOUNTER — Other Ambulatory Visit: Payer: Self-pay | Admitting: Family Medicine

## 2021-03-09 DIAGNOSIS — E039 Hypothyroidism, unspecified: Secondary | ICD-10-CM

## 2021-03-15 DIAGNOSIS — M2550 Pain in unspecified joint: Secondary | ICD-10-CM | POA: Diagnosis not present

## 2021-03-15 DIAGNOSIS — M0579 Rheumatoid arthritis with rheumatoid factor of multiple sites without organ or systems involvement: Secondary | ICD-10-CM | POA: Diagnosis not present

## 2021-03-15 DIAGNOSIS — R5382 Chronic fatigue, unspecified: Secondary | ICD-10-CM | POA: Diagnosis not present

## 2021-03-15 DIAGNOSIS — Z681 Body mass index (BMI) 19 or less, adult: Secondary | ICD-10-CM | POA: Diagnosis not present

## 2021-03-21 DIAGNOSIS — L821 Other seborrheic keratosis: Secondary | ICD-10-CM | POA: Diagnosis not present

## 2021-03-21 DIAGNOSIS — Z85828 Personal history of other malignant neoplasm of skin: Secondary | ICD-10-CM | POA: Diagnosis not present

## 2021-03-21 DIAGNOSIS — L570 Actinic keratosis: Secondary | ICD-10-CM | POA: Diagnosis not present

## 2021-03-21 DIAGNOSIS — L819 Disorder of pigmentation, unspecified: Secondary | ICD-10-CM | POA: Diagnosis not present

## 2021-03-21 DIAGNOSIS — Z08 Encounter for follow-up examination after completed treatment for malignant neoplasm: Secondary | ICD-10-CM | POA: Diagnosis not present

## 2021-04-03 ENCOUNTER — Other Ambulatory Visit: Payer: Self-pay | Admitting: Gastroenterology

## 2021-04-09 ENCOUNTER — Other Ambulatory Visit: Payer: Self-pay

## 2021-04-09 DIAGNOSIS — K31819 Angiodysplasia of stomach and duodenum without bleeding: Secondary | ICD-10-CM

## 2021-04-09 DIAGNOSIS — K2270 Barrett's esophagus without dysplasia: Secondary | ICD-10-CM

## 2021-04-09 MED ORDER — PANTOPRAZOLE SODIUM 40 MG PO TBEC: 40.0000 mg | DELAYED_RELEASE_TABLET | Freq: Every day | ORAL | 0 refills | Status: DC

## 2021-04-19 ENCOUNTER — Other Ambulatory Visit: Payer: Self-pay

## 2021-04-19 ENCOUNTER — Encounter: Payer: Self-pay | Admitting: Surgery

## 2021-04-19 ENCOUNTER — Ambulatory Visit (INDEPENDENT_AMBULATORY_CARE_PROVIDER_SITE_OTHER): Payer: Medicare Other | Admitting: Surgery

## 2021-04-19 ENCOUNTER — Ambulatory Visit: Payer: Medicare Other

## 2021-04-19 ENCOUNTER — Other Ambulatory Visit: Payer: Self-pay | Admitting: *Deleted

## 2021-04-19 VITALS — BP 114/68 | HR 73 | Ht 67.0 in | Wt 113.0 lb

## 2021-04-19 DIAGNOSIS — S32010A Wedge compression fracture of first lumbar vertebra, initial encounter for closed fracture: Secondary | ICD-10-CM | POA: Diagnosis not present

## 2021-04-19 DIAGNOSIS — G8929 Other chronic pain: Secondary | ICD-10-CM | POA: Diagnosis not present

## 2021-04-19 DIAGNOSIS — I714 Abdominal aortic aneurysm, without rupture, unspecified: Secondary | ICD-10-CM

## 2021-04-19 DIAGNOSIS — M545 Low back pain, unspecified: Secondary | ICD-10-CM | POA: Diagnosis not present
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
## 2021-04-23 ENCOUNTER — Ambulatory Visit (HOSPITAL_COMMUNITY): Payer: Medicare Other

## 2021-04-26 ENCOUNTER — Other Ambulatory Visit: Payer: Self-pay

## 2021-04-26 ENCOUNTER — Ambulatory Visit (HOSPITAL_COMMUNITY)
Admission: RE | Admit: 2021-04-26 | Discharge: 2021-04-26 | Disposition: A | Discharge status: Home / Self Care | Payer: Medicare Other | Source: Ambulatory Visit | Attending: Vascular Surgery | Admitting: Vascular Surgery

## 2021-04-26 DIAGNOSIS — K5730 Diverticulosis of large intestine without perforation or abscess without bleeding: Secondary | ICD-10-CM | POA: Diagnosis not present

## 2021-04-26 DIAGNOSIS — I714 Abdominal aortic aneurysm, without rupture, unspecified: Secondary | ICD-10-CM | POA: Insufficient documentation

## 2021-04-26 DIAGNOSIS — I723 Aneurysm of iliac artery: Secondary | ICD-10-CM | POA: Diagnosis not present

## 2021-04-26 LAB — POCT I-STAT CREATININE: Creatinine, Ser: 0.5 mg/dL — ABNORMAL LOW (ref 0.61–1.24)

## 2021-04-26 MED ORDER — SODIUM CHLORIDE (PF) 0.9 % IJ SOLN
INTRAMUSCULAR | Status: AC
  Filled 2021-04-26: qty 50

## 2021-04-26 MED ORDER — IOHEXOL 350 MG/ML SOLN
100.0000 mL | Freq: Once | INTRAVENOUS | Status: AC | PRN
  Administered 2021-04-26: 100 mL via INTRAVENOUS

## 2021-04-30 ENCOUNTER — Other Ambulatory Visit: Payer: Self-pay | Admitting: Orthopaedic Surgery

## 2021-04-30 ENCOUNTER — Other Ambulatory Visit: Payer: Self-pay | Admitting: Family Medicine

## 2021-04-30 ENCOUNTER — Encounter: Payer: Self-pay | Admitting: Surgery

## 2021-04-30 ENCOUNTER — Other Ambulatory Visit: Payer: Self-pay

## 2021-04-30 ENCOUNTER — Telehealth: Payer: Self-pay | Admitting: Orthopaedic Surgery

## 2021-04-30 ENCOUNTER — Ambulatory Visit (INDEPENDENT_AMBULATORY_CARE_PROVIDER_SITE_OTHER): Payer: Medicare Other | Admitting: Surgery

## 2021-04-30 VITALS — BP 117/70 | HR 78 | Temp 97.9°F | Resp 20 | Ht 67.0 in | Wt 113.0 lb

## 2021-04-30 DIAGNOSIS — I6523 Occlusion and stenosis of bilateral carotid arteries: Secondary | ICD-10-CM | POA: Diagnosis not present

## 2021-04-30 DIAGNOSIS — I70213 Atherosclerosis of native arteries of extremities with intermittent claudication, bilateral legs: Secondary | ICD-10-CM

## 2021-04-30 MED ORDER — HYDROCODONE-ACETAMINOPHEN 5-325 MG PO TABS: 1.0000 | ORAL_TABLET | Freq: Four times a day (QID) | ORAL | 0 refills | Status: DC | PRN

## 2021-04-30 NOTE — Telephone Encounter (Signed)
Pt called stating he had a CT scan done and has been in severe pain. Pt does have an appt for 05/01/21 @ 2:45 but he states if Dr. Lorin Mercy wants to call him and talk to him before then he would appreciate that.   [phone]
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
## 2021-04-30 NOTE — Telephone Encounter (Signed)
Please advise 

## 2021-04-30 NOTE — Telephone Encounter (Signed)
noted 

## 2021-05-01 ENCOUNTER — Other Ambulatory Visit: Payer: Self-pay | Admitting: Family Medicine

## 2021-05-01 ENCOUNTER — Encounter: Payer: Self-pay | Admitting: Orthopaedic Surgery

## 2021-05-01 ENCOUNTER — Ambulatory Visit (INDEPENDENT_AMBULATORY_CARE_PROVIDER_SITE_OTHER): Payer: Medicare Other | Admitting: Orthopaedic Surgery

## 2021-05-01 DIAGNOSIS — S32040D Wedge compression fracture of fourth lumbar vertebra, subsequent encounter for fracture with routine healing: Secondary | ICD-10-CM | POA: Diagnosis not present

## 2021-05-01 DIAGNOSIS — I6523 Occlusion and stenosis of bilateral carotid arteries: Secondary | ICD-10-CM

## 2021-05-01 DIAGNOSIS — S32040A Wedge compression fracture of fourth lumbar vertebra, initial encounter for closed fracture: Secondary | ICD-10-CM | POA: Diagnosis not present
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
## 2021-05-02 DIAGNOSIS — S32000A Wedge compression fracture of unspecified lumbar vertebra, initial encounter for closed fracture: Secondary | ICD-10-CM | POA: Insufficient documentation

## 2021-05-04 ENCOUNTER — Telehealth: Payer: Self-pay | Admitting: Orthopaedic Surgery

## 2021-05-04 NOTE — Telephone Encounter (Signed)
Please advise 

## 2021-05-04 NOTE — Addendum Note (Signed)
Addended by: Meyer Cory on: 05/04/2021 05:17 PM   Modules accepted: Orders

## 2021-05-04 NOTE — Telephone Encounter (Signed)
Patient called stating he has not been contacted for his bone density test and he wants to know if he still needs to come in for his appt on the 21st if that test has not been done. Please return call.

## 2021-05-04 NOTE — Telephone Encounter (Signed)
Appt canceled, patient aware.

## 2021-05-07 ENCOUNTER — Telehealth: Payer: Self-pay | Admitting: Orthopaedic Surgery

## 2021-05-07 ENCOUNTER — Other Ambulatory Visit: Payer: Self-pay | Admitting: Orthopaedic Surgery

## 2021-05-07 MED ORDER — HYDROCODONE-ACETAMINOPHEN 5-325 MG PO TABS: 1.0000 | ORAL_TABLET | Freq: Four times a day (QID) | ORAL | 0 refills | Status: DC | PRN

## 2021-05-07 NOTE — Telephone Encounter (Signed)
Patient states that he is having increasing pain and his right leg is locking up on him and the muscle in that leg is about to kill him. The medication that you gave him only helps x one hour at the most. He had a miserable weekend. He cannot walk that well. He states that he cannot take this pain and almost went to the ED. He would like to know what he can do?  Please advise.

## 2021-05-07 NOTE — Telephone Encounter (Signed)
noted 

## 2021-05-07 NOTE — Telephone Encounter (Signed)
Pt called and would like to speak with Gwinda Passe about the pain in his right hip.   CB [phone]

## 2021-05-09 ENCOUNTER — Ambulatory Visit: Payer: Medicare Other | Admitting: Orthopaedic Surgery

## 2021-05-22 ENCOUNTER — Ambulatory Visit (INDEPENDENT_AMBULATORY_CARE_PROVIDER_SITE_OTHER): Payer: Medicare Other | Admitting: Orthopaedic Surgery

## 2021-05-22 ENCOUNTER — Ambulatory Visit (INDEPENDENT_AMBULATORY_CARE_PROVIDER_SITE_OTHER): Payer: Medicare Other

## 2021-05-22 ENCOUNTER — Other Ambulatory Visit: Payer: Self-pay

## 2021-05-22 VITALS — BP 145/74 | HR 59 | Ht 67.0 in | Wt 113.0 lb

## 2021-05-22 DIAGNOSIS — S32040D Wedge compression fracture of fourth lumbar vertebra, subsequent encounter for fracture with routine healing: Secondary | ICD-10-CM

## 2021-05-22 DIAGNOSIS — M79604 Pain in right leg: Secondary | ICD-10-CM

## 2021-05-22 DIAGNOSIS — M8008XA Age-related osteoporosis with current pathological fracture, vertebra(e), initial encounter for fracture: Secondary | ICD-10-CM | POA: Diagnosis not present
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
## 2021-05-24 ENCOUNTER — Other Ambulatory Visit: Payer: Self-pay | Admitting: Orthopaedic Surgery

## 2021-05-24 DIAGNOSIS — S32040G Wedge compression fracture of fourth lumbar vertebra, subsequent encounter for fracture with delayed healing: Secondary | ICD-10-CM

## 2021-05-25 ENCOUNTER — Ambulatory Visit
Admission: RE | Admit: 2021-05-25 | Discharge: 2021-05-25 | Disposition: A | Discharge status: Home / Self Care | Payer: Medicare Other | Source: Ambulatory Visit | Attending: Orthopaedic Surgery | Admitting: Orthopaedic Surgery

## 2021-05-25 DIAGNOSIS — M810 Age-related osteoporosis without current pathological fracture: Secondary | ICD-10-CM | POA: Diagnosis not present

## 2021-05-25 DIAGNOSIS — S32040D Wedge compression fracture of fourth lumbar vertebra, subsequent encounter for fracture with routine healing: Secondary | ICD-10-CM

## 2021-05-25 DIAGNOSIS — S32040A Wedge compression fracture of fourth lumbar vertebra, initial encounter for closed fracture: Secondary | ICD-10-CM

## 2021-05-28 ENCOUNTER — Other Ambulatory Visit: Payer: Self-pay | Admitting: Family Medicine

## 2021-05-28 ENCOUNTER — Telehealth: Payer: Self-pay | Admitting: Orthopaedic Surgery

## 2021-05-28 NOTE — Telephone Encounter (Signed)
Pt calling with questions about a density test pt had. Pt was told once the test was completed he would get a call back to discuss what the next steps would be from there. Pt did not know if he would need another appt with Dr. Lorin Mercy or be referred for an inj like was previously mentioned in his appt. The best call back number is [phone].

## 2021-05-28 NOTE — Telephone Encounter (Signed)
I called patient and advised. He has not heard anything in regards to scheduling vertebroplasty. Gabriel Cirri- could you check on this for me and either let me or patient know where we are in scheduling process? Thanks.

## 2021-05-28 NOTE — Telephone Encounter (Signed)
Pt called back wanting to know the office his vertebroplasty will be with and he would like the number so he doesn't have to wait for them to call him for an appt.   [phone]

## 2021-05-29 NOTE — Telephone Encounter (Signed)
Spoke with [place]. Order has been entered and [facility] has entered and order for Rad Eval to accompany the order. Gabriel Cirri is reaching out to [place] at [facility] to check status.

## 2021-05-29 NOTE — Telephone Encounter (Signed)
Scheduled

## 2021-05-31 ENCOUNTER — Telehealth: Payer: Self-pay | Admitting: Radiology

## 2021-05-31 ENCOUNTER — Other Ambulatory Visit: Payer: Self-pay | Admitting: *Deleted

## 2021-05-31 DIAGNOSIS — S32040D Wedge compression fracture of fourth lumbar vertebra, subsequent encounter for fracture with routine healing: Secondary | ICD-10-CM

## 2021-05-31 NOTE — Telephone Encounter (Signed)
Received call from Elmo Putt, [place], with [facility]. Patient is having consult for kyphoplasty tomorrow with hope to do procedure next week. Per radiologist, he will need MRI lumbar spine without contrast prior to consult. Elmo Putt would like for order to be entered so that they can get patient scheduled prior to consult tomorrow.   Order entered.

## 2021-06-01 ENCOUNTER — Other Ambulatory Visit: Payer: Self-pay | Admitting: *Deleted

## 2021-06-01 ENCOUNTER — Ambulatory Visit
Admission: RE | Admit: 2021-06-01 | Discharge: 2021-06-01 | Disposition: A | Discharge status: Home / Self Care | Payer: Medicare Other | Source: Ambulatory Visit | Attending: Orthopaedic Surgery | Admitting: Orthopaedic Surgery

## 2021-06-01 ENCOUNTER — Other Ambulatory Visit: Payer: Self-pay

## 2021-06-01 DIAGNOSIS — S32040D Wedge compression fracture of fourth lumbar vertebra, subsequent encounter for fracture with routine healing: Secondary | ICD-10-CM

## 2021-06-01 DIAGNOSIS — S32040A Wedge compression fracture of fourth lumbar vertebra, initial encounter for closed fracture: Secondary | ICD-10-CM | POA: Diagnosis not present

## 2021-06-01 DIAGNOSIS — S32040G Wedge compression fracture of fourth lumbar vertebra, subsequent encounter for fracture with delayed healing: Secondary | ICD-10-CM

## 2021-06-01 DIAGNOSIS — M545 Low back pain, unspecified: Secondary | ICD-10-CM | POA: Diagnosis not present

## 2021-06-01 DIAGNOSIS — M48061 Spinal stenosis, lumbar region without neurogenic claudication: Secondary | ICD-10-CM | POA: Diagnosis not present

## 2021-06-01 DIAGNOSIS — M4846XA Fatigue fracture of vertebra, lumbar region, initial encounter for fracture: Secondary | ICD-10-CM

## 2021-06-01 DIAGNOSIS — S32030A Wedge compression fracture of third lumbar vertebra, initial encounter for closed fracture: Secondary | ICD-10-CM | POA: Diagnosis not present
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
## 2021-06-05 DIAGNOSIS — M4846XA Fatigue fracture of vertebra, lumbar region, initial encounter for fracture: Secondary | ICD-10-CM | POA: Diagnosis not present

## 2021-06-05 LAB — CBC
HCT: 36.9 % — ABNORMAL LOW (ref 38.5–50.0)
Hemoglobin: 12.4 g/dL — ABNORMAL LOW (ref 13.2–17.1)
MCH: 32.5 pg (ref 27.0–33.0)
MCHC: 33.6 g/dL (ref 32.0–36.0)
MCV: 96.6 fL (ref 80.0–100.0)
MPV: 12.3 fL (ref 7.5–12.5)
Platelets: 233 10*3/uL (ref 140–400)
RBC: 3.82 10*6/uL — ABNORMAL LOW (ref 4.20–5.80)
RDW: 13.2 % (ref 11.0–15.0)
WBC: 5.5 10*3/uL (ref 3.8–10.8)

## 2021-06-05 LAB — COMPLETE METABOLIC PANEL WITH GFR
AG Ratio: 1.8 (calc) (ref 1.0–2.5)
ALT: 9 U/L (ref 9–46)
AST: 14 U/L (ref 10–35)
Albumin: 4.4 g/dL (ref 3.6–5.1)
Alkaline phosphatase (APISO): 108 U/L (ref 35–144)
BUN/Creatinine Ratio: 35 (calc) — ABNORMAL HIGH (ref 6–22)
BUN: 19 mg/dL (ref 7–25)
CO2: 36 mmol/L — ABNORMAL HIGH (ref 20–32)
Calcium: 9.7 mg/dL (ref 8.6–10.3)
Chloride: 98 mmol/L (ref 98–110)
Creat: 0.54 mg/dL — ABNORMAL LOW (ref 0.70–1.28)
Globulin: 2.5 g/dL (calc) (ref 1.9–3.7)
Glucose, Bld: 105 mg/dL — ABNORMAL HIGH (ref 65–99)
Potassium: 5 mmol/L (ref 3.5–5.3)
Sodium: 138 mmol/L (ref 135–146)
Total Bilirubin: 0.5 mg/dL (ref 0.2–1.2)
Total Protein: 6.9 g/dL (ref 6.1–8.1)
eGFR: 101 mL/min/{1.73_m2} (ref >=60)

## 2021-06-07 ENCOUNTER — Other Ambulatory Visit: Payer: Medicare Other

## 2021-06-07 ENCOUNTER — Ambulatory Visit
Admission: RE | Admit: 2021-06-07 | Discharge: 2021-06-07 | Disposition: A | Discharge status: Home / Self Care | Payer: Medicare Other | Source: Ambulatory Visit | Attending: Orthopaedic Surgery | Admitting: Orthopaedic Surgery

## 2021-06-07 ENCOUNTER — Other Ambulatory Visit: Payer: Self-pay | Admitting: Orthopaedic Surgery

## 2021-06-07 ENCOUNTER — Other Ambulatory Visit: Payer: Self-pay

## 2021-06-07 DIAGNOSIS — M8008XA Age-related osteoporosis with current pathological fracture, vertebra(e), initial encounter for fracture: Secondary | ICD-10-CM

## 2021-06-07 DIAGNOSIS — S32040D Wedge compression fracture of fourth lumbar vertebra, subsequent encounter for fracture with routine healing: Secondary | ICD-10-CM

## 2021-06-07 DIAGNOSIS — M4856XA Collapsed vertebra, not elsewhere classified, lumbar region, initial encounter for fracture: Secondary | ICD-10-CM | POA: Diagnosis not present

## 2021-06-07 DIAGNOSIS — M79604 Pain in right leg: Secondary | ICD-10-CM

## 2021-06-07 HISTORY — PX: IR KYPHO EA ADDL LEVEL THORACIC OR LUMBAR: IMG5520

## 2021-06-07 HISTORY — PX: IR KYPHO LUMBAR INC FX REDUCE BONE BX UNI/BIL CANNULATION INC/IMAGING: IMG5519

## 2021-06-07 MED ORDER — SODIUM CHLORIDE 0.9 % IV SOLN: Freq: Once | INTRAVENOUS | Status: AC

## 2021-06-07 MED ORDER — VANCOMYCIN HCL IN DEXTROSE 1-5 GM/200ML-% IV SOLN
1000.0000 mg | INTRAVENOUS | Status: AC
  Administered 2021-06-07: 1000 mg via INTRAVENOUS

## 2021-06-07 MED ORDER — FENTANYL CITRATE PF 50 MCG/ML IJ SOSY
25.0000 ug | PREFILLED_SYRINGE | INTRAMUSCULAR | Status: DC | PRN
  Administered 2021-06-07 (×4): 25 ug via INTRAVENOUS

## 2021-06-07 MED ORDER — IOPAMIDOL (ISOVUE-M 200) INJECTION 41%
20.0000 mL | Freq: Once | INTRAMUSCULAR | Status: AC
  Administered 2021-06-07: 20 mL

## 2021-06-07 MED ORDER — MIDAZOLAM HCL 2 MG/2ML IJ SOLN
1.0000 mg | INTRAMUSCULAR | Status: DC | PRN
  Administered 2021-06-07 (×2): 1 mg via INTRAVENOUS
  Administered 2021-06-07 (×4): 0.5 mg via INTRAVENOUS

## 2021-06-07 MED ORDER — ACETAMINOPHEN 10 MG/ML IV SOLN
1000.0000 mg | Freq: Once | INTRAVENOUS | Status: AC
  Administered 2021-06-07: 1000 mg via INTRAVENOUS

## 2021-06-07 MED ORDER — KETOROLAC TROMETHAMINE 15 MG/ML IJ SOLN: 15.0000 mg | Freq: Once | INTRAMUSCULAR | Status: DC

## 2021-06-07 NOTE — Progress Notes (Signed)
Pt back in nursing recovery area. Pt alert and calm. Pt follows commands, talks in complete sentences and has no complaints at this time. Pt will be monitored until discharged by Radiologist.

## 2021-06-07 NOTE — Discharge Instructions (Signed)
Kyphoplasty Post Procedure Discharge Instructions ? ?May resume a regular diet and any medications that you routinely take (including pain medications). However, if you are taking Aspirin or an anticoagulant/blood thinner you will be told when you can resume taking these by the healthcare provider. ?No driving day of procedure. ?The day of your procedure take it easy. You may use an ice pack as needed to injection sites on back.  Ice to back 30 minutes on and 30 minutes off, as needed. ?May remove bandaids tomorrow after taking a shower. Replace daily with a clean bandaid until healed.  ?Do not lift anything heavier than a milk jug for 1-2 weeks or determined by your physician.  ?Follow up with your physician in 2 weeks. ? ? ? ?Please contact our office at [phone] for the following symptoms or if you have any questions: ? ?Fever greater than 100 degrees ?Increased swelling, pain, or redness at injection site. ?Increased back and/or leg pain ?New numbness or change in symptoms from before the procedure.  ? ? ?Thank you for visiting [facility].  ?

## 2021-06-13 ENCOUNTER — Other Ambulatory Visit: Payer: Self-pay | Admitting: Interventional Radiology

## 2021-06-13 ENCOUNTER — Telehealth: Payer: Self-pay

## 2021-06-13 DIAGNOSIS — S32040A Wedge compression fracture of fourth lumbar vertebra, initial encounter for closed fracture: Secondary | ICD-10-CM

## 2021-06-13 NOTE — Telephone Encounter (Signed)
Phone call to pt to follow up from his kyphoplasty on 06/07/21. Pt reports his pain is "much better". Pt states his pain was normally around an 8 or 9 prior to the procedure and now it stays around a 4. Pt reports he is able to move around more at home without significant pain. Pt denies any signs of infection, redness at the site, draining or fever. Pt has no complaints at this time and will be scheduled for a telephone follow up with Dr. Laurence Ferrari next week. Pt advised to call back if anything were to change or any concerns arise and we will arrange an in person appointment. Pt verbalized understanding.

## 2021-06-21 ENCOUNTER — Ambulatory Visit
Admission: RE | Admit: 2021-06-21 | Discharge: 2021-06-21 | Disposition: A | Discharge status: Home / Self Care | Payer: Medicare Other | Source: Ambulatory Visit | Attending: Interventional Radiology | Admitting: Interventional Radiology

## 2021-06-21 ENCOUNTER — Other Ambulatory Visit: Payer: Self-pay

## 2021-06-21 DIAGNOSIS — S32040A Wedge compression fracture of fourth lumbar vertebra, initial encounter for closed fracture: Secondary | ICD-10-CM

## 2021-06-21 DIAGNOSIS — S32040D Wedge compression fracture of fourth lumbar vertebra, subsequent encounter for fracture with routine healing: Secondary | ICD-10-CM | POA: Diagnosis not present

## 2021-06-21 DIAGNOSIS — S32030D Wedge compression fracture of third lumbar vertebra, subsequent encounter for fracture with routine healing: Secondary | ICD-10-CM | POA: Diagnosis not present

## 2021-06-21 NOTE — Progress Notes (Signed)
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 in the last 8760 hours.  BMP: Recent Labs    08/02/20 1500 01/15/21 0000 04/26/21 0751 06/05/21 0817  NA 128* 134*  --  138  K 3.6 5.0  --  5.0  CL 90* 96*  --  98  CO2 26 33*  --  36*  GLUCOSE 93 92  --  105*  BUN 13 14  --  19  CALCIUM 9.1 9.3  --  9.7  CREATININE 0.52* 0.47* 0.50* 0.54*  GFRNONAA >60  --   --   --     LIVER FUNCTION TESTS: Recent Labs    01/15/21 0000 06/05/21 0817  BILITOT 0.3 0.5  AST 12 14  ALT 9 9  PROT 6.2 6.9    TUMOR MARKERS: No results for input(s): AFPTM, CEA, CA199, CHROMGRNA in the last 8760 hours.  Assessment and Plan:   Pleasant 81 year old gentleman doing very well 2 weeks status post L3 and L4 kyphoplasty.  His symptoms are greatly improved.  He has no active complaints at this time.  We will schedule no additional follow-up evaluations.  He knows we are always available if she he should ever need [place] in the future.     Electronically Signed: Criselda Peaches 06/21/2021, 12:44 PM  I spent a total of  15 Minutes in face to face in  clinical consultation, greater than 50% of which was counseling/coordinating care for L3 and L4 compression fractures.

## 2021-06-26 DIAGNOSIS — M0579 Rheumatoid arthritis with rheumatoid factor of multiple sites without organ or systems involvement: Secondary | ICD-10-CM | POA: Diagnosis not present

## 2021-06-26 DIAGNOSIS — R5382 Chronic fatigue, unspecified: Secondary | ICD-10-CM | POA: Diagnosis not present

## 2021-06-26 DIAGNOSIS — M2550 Pain in unspecified joint: Secondary | ICD-10-CM | POA: Diagnosis not present

## 2021-06-26 DIAGNOSIS — Z681 Body mass index (BMI) 19 or less, adult: Secondary | ICD-10-CM | POA: Diagnosis not present

## 2021-07-23 ENCOUNTER — Other Ambulatory Visit: Payer: Self-pay | Admitting: Family Medicine

## 2021-07-29 ENCOUNTER — Other Ambulatory Visit: Payer: Self-pay | Admitting: Family Medicine

## 2021-07-30 ENCOUNTER — Other Ambulatory Visit: Payer: Self-pay | Admitting: Family Medicine

## 2021-07-30 DIAGNOSIS — D500 Iron deficiency anemia secondary to blood loss (chronic): Secondary | ICD-10-CM

## 2021-08-09 ENCOUNTER — Other Ambulatory Visit: Payer: Self-pay | Admitting: Family Medicine

## 2021-08-16 ENCOUNTER — Other Ambulatory Visit: Payer: Self-pay | Admitting: Family Medicine

## 2021-08-16 DIAGNOSIS — K2270 Barrett's esophagus without dysplasia: Secondary | ICD-10-CM

## 2021-08-16 DIAGNOSIS — K31819 Angiodysplasia of stomach and duodenum without bleeding: Secondary | ICD-10-CM

## 2021-09-05 ENCOUNTER — Other Ambulatory Visit: Payer: Self-pay | Admitting: Family Medicine

## 2021-09-05 DIAGNOSIS — E039 Hypothyroidism, unspecified: Secondary | ICD-10-CM

## 2021-09-24 ENCOUNTER — Other Ambulatory Visit: Payer: Self-pay | Admitting: Orthopaedic Surgery

## 2021-09-24 ENCOUNTER — Telehealth: Payer: Self-pay | Admitting: Orthopaedic Surgery

## 2021-09-24 MED ORDER — HYDROCODONE-ACETAMINOPHEN 5-325 MG PO TABS
1.0000 | ORAL_TABLET | Freq: Four times a day (QID) | ORAL | 0 refills | Status: DC | PRN
Start: 2021-09-24 — End: 2021-11-12

## 2021-09-24 NOTE — Telephone Encounter (Signed)
I called patient and advised. 

## 2021-09-24 NOTE — Telephone Encounter (Signed)
I called patient. He states that his back has been doing well since he had the cement put in it. He has an ache in his thigh though that will not go away. He has taken the pain medication sparingly and asked if Dr. Lorin Mercy would please send him in a refill. He does not feel like he would benefit from coming in and having x-rays. He uses the [facility] on [address]. ? ?Please advise. ?

## 2021-09-24 NOTE — Telephone Encounter (Signed)
Pt called and would really like to speak betsy about how his pain has gotten worse.  ? ?Cb [phone]  ?

## 2021-09-25 DIAGNOSIS — M0579 Rheumatoid arthritis with rheumatoid factor of multiple sites without organ or systems involvement: Secondary | ICD-10-CM | POA: Diagnosis not present

## 2021-09-25 DIAGNOSIS — Z79899 Other long term (current) drug therapy: Secondary | ICD-10-CM | POA: Diagnosis not present

## 2021-10-29 ENCOUNTER — Telehealth: Payer: Self-pay | Admitting: Family Medicine

## 2021-10-29 NOTE — Telephone Encounter (Signed)
Patient said he would check his calendar and call us back. AMUCK

## 2021-10-29 NOTE — Telephone Encounter (Signed)
Patient is scheduled for 7/20 at 8:50-gh

## 2021-10-29 NOTE — Telephone Encounter (Signed)
Pls contact pt to schedule appt. Last seen 12/2020. Sending medication to hold until appt. Thanks

## 2021-11-12 ENCOUNTER — Telehealth: Payer: Self-pay

## 2021-11-12 ENCOUNTER — Other Ambulatory Visit: Payer: Self-pay | Admitting: Orthopaedic Surgery

## 2021-11-12 ENCOUNTER — Other Ambulatory Visit: Payer: Self-pay

## 2021-11-12 ENCOUNTER — Telehealth: Payer: Self-pay | Admitting: Orthopaedic Surgery

## 2021-11-12 DIAGNOSIS — I739 Peripheral vascular disease, unspecified: Secondary | ICD-10-CM

## 2021-11-12 MED ORDER — HYDROCODONE-ACETAMINOPHEN 5-325 MG PO TABS: 1.0000 | ORAL_TABLET | Freq: Four times a day (QID) | ORAL | 0 refills | Status: DC | PRN

## 2021-11-15 ENCOUNTER — Ambulatory Visit (HOSPITAL_COMMUNITY)
Admission: RE | Admit: 2021-11-15 | Discharge: 2021-11-15 | Disposition: A | Discharge status: Home / Self Care | Payer: Medicare Other | Source: Ambulatory Visit | Attending: Surgery | Admitting: Surgery

## 2021-11-15 DIAGNOSIS — I739 Peripheral vascular disease, unspecified: Secondary | ICD-10-CM | POA: Insufficient documentation

## 2021-11-22 ENCOUNTER — Telehealth: Payer: Self-pay | Admitting: Family Medicine

## 2021-11-22 MED ORDER — AMLODIPINE BESYLATE 10 MG PO TABS: ORAL_TABLET | ORAL | 0 refills | Status: DC

## 2021-11-22 NOTE — Telephone Encounter (Signed)
Pt called.  He needs a refill on his Amlodipine. Appointment is scheduled for July 20th.

## 2021-11-22 NOTE — Telephone Encounter (Signed)
Refill sent.

## 2021-11-25 ENCOUNTER — Emergency Department (HOSPITAL_COMMUNITY): Payer: Medicare Other

## 2021-11-25 ENCOUNTER — Inpatient Hospital Stay (HOSPITAL_COMMUNITY)
Admission: EM | Admit: 2021-11-25 | Discharge: 2021-12-03 | DRG: 189 | Disposition: A | Discharge status: Skilled Nursing Facility | Payer: Medicare Other | Attending: Internal Medicine | Admitting: Internal Medicine

## 2021-11-25 ENCOUNTER — Encounter (HOSPITAL_COMMUNITY): Payer: Self-pay | Admitting: Emergency Medicine

## 2021-11-25 ENCOUNTER — Other Ambulatory Visit: Payer: Self-pay

## 2021-11-25 DIAGNOSIS — Z634 Disappearance and death of family member: Secondary | ICD-10-CM

## 2021-11-25 DIAGNOSIS — F419 Anxiety disorder, unspecified: Secondary | ICD-10-CM | POA: Diagnosis present

## 2021-11-25 DIAGNOSIS — J189 Pneumonia, unspecified organism: Secondary | ICD-10-CM | POA: Diagnosis present

## 2021-11-25 DIAGNOSIS — E872 Acidosis, unspecified: Secondary | ICD-10-CM | POA: Diagnosis present

## 2021-11-25 DIAGNOSIS — M48061 Spinal stenosis, lumbar region without neurogenic claudication: Secondary | ICD-10-CM | POA: Diagnosis not present

## 2021-11-25 DIAGNOSIS — I5031 Acute diastolic (congestive) heart failure: Secondary | ICD-10-CM | POA: Diagnosis present

## 2021-11-25 DIAGNOSIS — I724 Aneurysm of artery of lower extremity: Secondary | ICD-10-CM | POA: Diagnosis present

## 2021-11-25 DIAGNOSIS — D500 Iron deficiency anemia secondary to blood loss (chronic): Secondary | ICD-10-CM | POA: Diagnosis not present

## 2021-11-25 DIAGNOSIS — W1839XA Other fall on same level, initial encounter: Secondary | ICD-10-CM | POA: Diagnosis present

## 2021-11-25 DIAGNOSIS — I248 Other forms of acute ischemic heart disease: Secondary | ICD-10-CM | POA: Diagnosis not present

## 2021-11-25 DIAGNOSIS — E039 Hypothyroidism, unspecified: Secondary | ICD-10-CM | POA: Diagnosis present

## 2021-11-25 DIAGNOSIS — Z8719 Personal history of other diseases of the digestive system: Secondary | ICD-10-CM

## 2021-11-25 DIAGNOSIS — Z85038 Personal history of other malignant neoplasm of large intestine: Secondary | ICD-10-CM

## 2021-11-25 DIAGNOSIS — R42 Dizziness and giddiness: Secondary | ICD-10-CM | POA: Diagnosis not present

## 2021-11-25 DIAGNOSIS — J449 Chronic obstructive pulmonary disease, unspecified: Secondary | ICD-10-CM | POA: Diagnosis not present

## 2021-11-25 DIAGNOSIS — Y92008 Other place in unspecified non-institutional (private) residence as the place of occurrence of the external cause: Secondary | ICD-10-CM | POA: Diagnosis not present

## 2021-11-25 DIAGNOSIS — Y9301 Activity, walking, marching and hiking: Secondary | ICD-10-CM | POA: Diagnosis present

## 2021-11-25 DIAGNOSIS — S42201A Unspecified fracture of upper end of right humerus, initial encounter for closed fracture: Secondary | ICD-10-CM | POA: Diagnosis not present

## 2021-11-25 DIAGNOSIS — R54 Age-related physical debility: Secondary | ICD-10-CM | POA: Diagnosis present

## 2021-11-25 DIAGNOSIS — R531 Weakness: Secondary | ICD-10-CM | POA: Diagnosis not present

## 2021-11-25 DIAGNOSIS — M5030 Other cervical disc degeneration, unspecified cervical region: Secondary | ICD-10-CM | POA: Diagnosis not present

## 2021-11-25 DIAGNOSIS — R64 Cachexia: Secondary | ICD-10-CM | POA: Diagnosis not present

## 2021-11-25 DIAGNOSIS — I3481 Nonrheumatic mitral (valve) annulus calcification: Secondary | ICD-10-CM | POA: Diagnosis present

## 2021-11-25 DIAGNOSIS — M6281 Muscle weakness (generalized): Secondary | ICD-10-CM | POA: Diagnosis not present

## 2021-11-25 DIAGNOSIS — N401 Enlarged prostate with lower urinary tract symptoms: Secondary | ICD-10-CM | POA: Diagnosis not present

## 2021-11-25 DIAGNOSIS — Z681 Body mass index (BMI) 19 or less, adult: Secondary | ICD-10-CM

## 2021-11-25 DIAGNOSIS — K760 Fatty (change of) liver, not elsewhere classified: Secondary | ICD-10-CM | POA: Diagnosis present

## 2021-11-25 DIAGNOSIS — M79604 Pain in right leg: Secondary | ICD-10-CM | POA: Diagnosis present

## 2021-11-25 DIAGNOSIS — J9811 Atelectasis: Secondary | ICD-10-CM | POA: Diagnosis not present

## 2021-11-25 DIAGNOSIS — Z515 Encounter for palliative care: Secondary | ICD-10-CM | POA: Diagnosis not present

## 2021-11-25 DIAGNOSIS — J438 Other emphysema: Secondary | ICD-10-CM | POA: Diagnosis present

## 2021-11-25 DIAGNOSIS — I110 Hypertensive heart disease with heart failure: Secondary | ICD-10-CM | POA: Diagnosis present

## 2021-11-25 DIAGNOSIS — L89151 Pressure ulcer of sacral region, stage 1: Secondary | ICD-10-CM | POA: Diagnosis not present

## 2021-11-25 DIAGNOSIS — M7989 Other specified soft tissue disorders: Secondary | ICD-10-CM | POA: Diagnosis not present

## 2021-11-25 DIAGNOSIS — R7303 Prediabetes: Secondary | ICD-10-CM | POA: Diagnosis not present

## 2021-11-25 DIAGNOSIS — Z85828 Personal history of other malignant neoplasm of skin: Secondary | ICD-10-CM

## 2021-11-25 DIAGNOSIS — Z79631 Long term (current) use of antimetabolite agent: Secondary | ICD-10-CM

## 2021-11-25 DIAGNOSIS — I714 Abdominal aortic aneurysm, without rupture, unspecified: Secondary | ICD-10-CM | POA: Diagnosis present

## 2021-11-25 DIAGNOSIS — E871 Hypo-osmolality and hyponatremia: Secondary | ICD-10-CM | POA: Diagnosis not present

## 2021-11-25 DIAGNOSIS — I2510 Atherosclerotic heart disease of native coronary artery without angina pectoris: Secondary | ICD-10-CM | POA: Diagnosis present

## 2021-11-25 DIAGNOSIS — J90 Pleural effusion, not elsewhere classified: Secondary | ICD-10-CM | POA: Diagnosis not present

## 2021-11-25 DIAGNOSIS — E46 Unspecified protein-calorie malnutrition: Secondary | ICD-10-CM | POA: Diagnosis not present

## 2021-11-25 DIAGNOSIS — J9602 Acute respiratory failure with hypercapnia: Secondary | ICD-10-CM | POA: Diagnosis present

## 2021-11-25 DIAGNOSIS — S0990XA Unspecified injury of head, initial encounter: Secondary | ICD-10-CM | POA: Diagnosis not present

## 2021-11-25 DIAGNOSIS — I25118 Atherosclerotic heart disease of native coronary artery with other forms of angina pectoris: Secondary | ICD-10-CM | POA: Diagnosis not present

## 2021-11-25 DIAGNOSIS — I350 Nonrheumatic aortic (valve) stenosis: Secondary | ICD-10-CM | POA: Diagnosis not present

## 2021-11-25 DIAGNOSIS — J439 Emphysema, unspecified: Secondary | ICD-10-CM | POA: Diagnosis not present

## 2021-11-25 DIAGNOSIS — E038 Other specified hypothyroidism: Secondary | ICD-10-CM | POA: Diagnosis present

## 2021-11-25 DIAGNOSIS — M19041 Primary osteoarthritis, right hand: Secondary | ICD-10-CM | POA: Diagnosis not present

## 2021-11-25 DIAGNOSIS — Z888 Allergy status to other drugs, medicaments and biological substances status: Secondary | ICD-10-CM

## 2021-11-25 DIAGNOSIS — L8990 Pressure ulcer of unspecified site, unspecified stage: Secondary | ICD-10-CM | POA: Insufficient documentation

## 2021-11-25 DIAGNOSIS — Z79899 Other long term (current) drug therapy: Secondary | ICD-10-CM

## 2021-11-25 DIAGNOSIS — R0902 Hypoxemia: Secondary | ICD-10-CM | POA: Diagnosis not present

## 2021-11-25 DIAGNOSIS — Z86010 Personal history of colonic polyps: Secondary | ICD-10-CM

## 2021-11-25 DIAGNOSIS — M1990 Unspecified osteoarthritis, unspecified site: Secondary | ICD-10-CM | POA: Diagnosis present

## 2021-11-25 DIAGNOSIS — D84821 Immunodeficiency due to drugs: Secondary | ICD-10-CM | POA: Diagnosis present

## 2021-11-25 DIAGNOSIS — L89109 Pressure ulcer of unspecified part of back, unspecified stage: Secondary | ICD-10-CM | POA: Insufficient documentation

## 2021-11-25 DIAGNOSIS — D849 Immunodeficiency, unspecified: Secondary | ICD-10-CM | POA: Diagnosis not present

## 2021-11-25 DIAGNOSIS — F3289 Other specified depressive episodes: Secondary | ICD-10-CM | POA: Diagnosis not present

## 2021-11-25 DIAGNOSIS — S199XXA Unspecified injury of neck, initial encounter: Secondary | ICD-10-CM | POA: Diagnosis not present

## 2021-11-25 DIAGNOSIS — I739 Peripheral vascular disease, unspecified: Secondary | ICD-10-CM | POA: Diagnosis not present

## 2021-11-25 DIAGNOSIS — E7800 Pure hypercholesterolemia, unspecified: Secondary | ICD-10-CM | POA: Diagnosis present

## 2021-11-25 DIAGNOSIS — R1312 Dysphagia, oropharyngeal phase: Secondary | ICD-10-CM | POA: Diagnosis not present

## 2021-11-25 DIAGNOSIS — Z7189 Other specified counseling: Secondary | ICD-10-CM | POA: Diagnosis not present

## 2021-11-25 DIAGNOSIS — D539 Nutritional anemia, unspecified: Secondary | ICD-10-CM | POA: Diagnosis present

## 2021-11-25 DIAGNOSIS — R339 Retention of urine, unspecified: Secondary | ICD-10-CM | POA: Diagnosis not present

## 2021-11-25 DIAGNOSIS — Z880 Allergy status to penicillin: Secondary | ICD-10-CM

## 2021-11-25 DIAGNOSIS — Z20822 Contact with and (suspected) exposure to covid-19: Secondary | ICD-10-CM | POA: Diagnosis present

## 2021-11-25 DIAGNOSIS — Z8673 Personal history of transient ischemic attack (TIA), and cerebral infarction without residual deficits: Secondary | ICD-10-CM

## 2021-11-25 DIAGNOSIS — F17210 Nicotine dependence, cigarettes, uncomplicated: Secondary | ICD-10-CM | POA: Diagnosis present

## 2021-11-25 DIAGNOSIS — Q600 Renal agenesis, unilateral: Secondary | ICD-10-CM | POA: Diagnosis not present

## 2021-11-25 DIAGNOSIS — S42211A Unspecified displaced fracture of surgical neck of right humerus, initial encounter for closed fracture: Secondary | ICD-10-CM | POA: Diagnosis present

## 2021-11-25 DIAGNOSIS — M5134 Other intervertebral disc degeneration, thoracic region: Secondary | ICD-10-CM | POA: Diagnosis not present

## 2021-11-25 DIAGNOSIS — J432 Centrilobular emphysema: Secondary | ICD-10-CM | POA: Diagnosis present

## 2021-11-25 DIAGNOSIS — I723 Aneurysm of iliac artery: Secondary | ICD-10-CM | POA: Diagnosis present

## 2021-11-25 DIAGNOSIS — I959 Hypotension, unspecified: Secondary | ICD-10-CM | POA: Diagnosis not present

## 2021-11-25 DIAGNOSIS — Q250 Patent ductus arteriosus: Secondary | ICD-10-CM

## 2021-11-25 DIAGNOSIS — Z885 Allergy status to narcotic agent status: Secondary | ICD-10-CM

## 2021-11-25 DIAGNOSIS — Z7401 Bed confinement status: Secondary | ICD-10-CM | POA: Diagnosis not present

## 2021-11-25 DIAGNOSIS — J9601 Acute respiratory failure with hypoxia: Principal | ICD-10-CM | POA: Diagnosis present

## 2021-11-25 DIAGNOSIS — W19XXXA Unspecified fall, initial encounter: Secondary | ICD-10-CM | POA: Diagnosis not present

## 2021-11-25 DIAGNOSIS — M2550 Pain in unspecified joint: Secondary | ICD-10-CM | POA: Diagnosis not present

## 2021-11-25 DIAGNOSIS — T451X5A Adverse effect of antineoplastic and immunosuppressive drugs, initial encounter: Secondary | ICD-10-CM | POA: Diagnosis present

## 2021-11-25 DIAGNOSIS — Z8249 Family history of ischemic heart disease and other diseases of the circulatory system: Secondary | ICD-10-CM

## 2021-11-25 DIAGNOSIS — Z95820 Peripheral vascular angioplasty status with implants and grafts: Secondary | ICD-10-CM

## 2021-11-25 DIAGNOSIS — M0579 Rheumatoid arthritis with rheumatoid factor of multiple sites without organ or systems involvement: Secondary | ICD-10-CM | POA: Diagnosis not present

## 2021-11-25 DIAGNOSIS — Z79890 Hormone replacement therapy (postmenopausal): Secondary | ICD-10-CM

## 2021-11-25 DIAGNOSIS — M454 Ankylosing spondylitis of thoracic region: Secondary | ICD-10-CM | POA: Diagnosis not present

## 2021-11-25 DIAGNOSIS — K922 Gastrointestinal hemorrhage, unspecified: Secondary | ICD-10-CM | POA: Diagnosis not present

## 2021-11-25 DIAGNOSIS — R55 Syncope and collapse: Secondary | ICD-10-CM | POA: Diagnosis not present

## 2021-11-25 DIAGNOSIS — K219 Gastro-esophageal reflux disease without esophagitis: Secondary | ICD-10-CM | POA: Diagnosis present

## 2021-11-25 DIAGNOSIS — G8929 Other chronic pain: Secondary | ICD-10-CM | POA: Diagnosis present

## 2021-11-25 DIAGNOSIS — M19042 Primary osteoarthritis, left hand: Secondary | ICD-10-CM | POA: Diagnosis not present

## 2021-11-25 DIAGNOSIS — S42211D Unspecified displaced fracture of surgical neck of right humerus, subsequent encounter for fracture with routine healing: Secondary | ICD-10-CM | POA: Diagnosis not present

## 2021-11-25 DIAGNOSIS — F411 Generalized anxiety disorder: Secondary | ICD-10-CM | POA: Diagnosis not present

## 2021-11-25 DIAGNOSIS — J9690 Respiratory failure, unspecified, unspecified whether with hypoxia or hypercapnia: Secondary | ICD-10-CM | POA: Diagnosis not present

## 2021-11-25 DIAGNOSIS — R310 Gross hematuria: Secondary | ICD-10-CM | POA: Diagnosis not present

## 2021-11-25 DIAGNOSIS — Z8679 Personal history of other diseases of the circulatory system: Secondary | ICD-10-CM

## 2021-11-25 LAB — URINALYSIS, ROUTINE W REFLEX MICROSCOPIC
Bacteria, UA: NONE SEEN
Bilirubin Urine: NEGATIVE
Glucose, UA: NEGATIVE mg/dL
Hgb urine dipstick: NEGATIVE
Ketones, ur: NEGATIVE mg/dL
Leukocytes,Ua: NEGATIVE
Nitrite: NEGATIVE
Protein, ur: 30 mg/dL — AB
Specific Gravity, Urine: >1.046 — ABNORMAL HIGH (ref 1.005–1.030)
pH: 6 (ref 5.0–8.0)

## 2021-11-25 LAB — I-STAT CHEM 8, ED
BUN: 37 mg/dL — ABNORMAL HIGH (ref 8–23)
Calcium, Ion: 1.18 mmol/L (ref 1.15–1.40)
Chloride: 93 mmol/L — ABNORMAL LOW (ref 98–111)
Creatinine, Ser: 0.6 mg/dL — ABNORMAL LOW (ref 0.61–1.24)
Glucose, Bld: 180 mg/dL — ABNORMAL HIGH (ref 70–99)
HCT: 34 % — ABNORMAL LOW (ref 39.0–52.0)
Hemoglobin: 11.6 g/dL — ABNORMAL LOW (ref 13.0–17.0)
Potassium: 4.7 mmol/L (ref 3.5–5.1)
Sodium: 134 mmol/L — ABNORMAL LOW (ref 135–145)
TCO2: 34 mmol/L — ABNORMAL HIGH (ref 22–32)

## 2021-11-25 LAB — COMPREHENSIVE METABOLIC PANEL
ALT: 100 U/L — ABNORMAL HIGH (ref 0–44)
AST: 118 U/L — ABNORMAL HIGH (ref 15–41)
Albumin: 3.3 g/dL — ABNORMAL LOW (ref 3.5–5.0)
Alkaline Phosphatase: 87 U/L (ref 38–126)
Anion gap: 10 (ref 5–15)
BUN: 32 mg/dL — ABNORMAL HIGH (ref 8–23)
CO2: 32 mmol/L (ref 22–32)
Calcium: 9 mg/dL (ref 8.9–10.3)
Chloride: 97 mmol/L — ABNORMAL LOW (ref 98–111)
Creatinine, Ser: 0.62 mg/dL (ref 0.61–1.24)
GFR, Estimated: >60 mL/min (ref >60)
Glucose, Bld: 181 mg/dL — ABNORMAL HIGH (ref 70–99)
Potassium: 4.7 mmol/L (ref 3.5–5.1)
Sodium: 139 mmol/L (ref 135–145)
Total Bilirubin: 1 mg/dL (ref 0.3–1.2)
Total Protein: 6.4 g/dL — ABNORMAL LOW (ref 6.5–8.1)

## 2021-11-25 LAB — BLOOD GAS, ARTERIAL
Acid-Base Excess: 7.4 mmol/L — ABNORMAL HIGH (ref 0.0–2.0)
Bicarbonate: 35.9 mmol/L — ABNORMAL HIGH (ref 20.0–28.0)
O2 Saturation: 98.1 %
Patient temperature: 37
pCO2 arterial: 68 mmHg (ref 32–48)
pH, Arterial: 7.33 — ABNORMAL LOW (ref 7.35–7.45)
pO2, Arterial: 82 mmHg — ABNORMAL LOW (ref 83–108)

## 2021-11-25 LAB — CBC WITH DIFFERENTIAL/PLATELET
Abs Immature Granulocytes: 0.4 10*3/uL — ABNORMAL HIGH (ref 0.00–0.07)
Basophils Absolute: 0 10*3/uL (ref 0.0–0.1)
Basophils Relative: 0 %
Eosinophils Absolute: 0 10*3/uL (ref 0.0–0.5)
Eosinophils Relative: 0 %
HCT: 34.7 % — ABNORMAL LOW (ref 39.0–52.0)
Hemoglobin: 11 g/dL — ABNORMAL LOW (ref 13.0–17.0)
Immature Granulocytes: 2 %
Lymphocytes Relative: 2 %
Lymphs Abs: 0.4 10*3/uL — ABNORMAL LOW (ref 0.7–4.0)
MCH: 34 pg (ref 26.0–34.0)
MCHC: 31.7 g/dL (ref 30.0–36.0)
MCV: 107.1 fL — ABNORMAL HIGH (ref 80.0–100.0)
Monocytes Absolute: 1 10*3/uL (ref 0.1–1.0)
Monocytes Relative: 6 %
Neutro Abs: 15.8 10*3/uL — ABNORMAL HIGH (ref 1.7–7.7)
Neutrophils Relative %: 90 %
Platelets: 242 10*3/uL (ref 150–400)
RBC: 3.24 MIL/uL — ABNORMAL LOW (ref 4.22–5.81)
RDW: 15 % (ref 11.5–15.5)
WBC: 17.6 10*3/uL — ABNORMAL HIGH (ref 4.0–10.5)
nRBC: 0 % (ref 0.0–0.2)

## 2021-11-25 LAB — SARS CORONAVIRUS 2 BY RT PCR: SARS Coronavirus 2 by RT PCR: NEGATIVE

## 2021-11-25 LAB — TROPONIN I (HIGH SENSITIVITY)
Troponin I (High Sensitivity): 136 ng/L (ref <18)
Troponin I (High Sensitivity): 89 ng/L — ABNORMAL HIGH (ref <18)

## 2021-11-25 LAB — LACTIC ACID, PLASMA: Lactic Acid, Venous: 7.9 mmol/L (ref 0.5–1.9)

## 2021-11-25 LAB — MRSA NEXT GEN BY PCR, NASAL: MRSA by PCR Next Gen: NOT DETECTED

## 2021-11-25 LAB — TSH: TSH: 1.394 u[IU]/mL (ref 0.350–4.500)

## 2021-11-25 MED ORDER — LACTATED RINGERS IV BOLUS
1000.0000 mL | Freq: Once | INTRAVENOUS | Status: AC
  Administered 2021-11-25: 1000 mL via INTRAVENOUS

## 2021-11-25 MED ORDER — TRAMADOL HCL 50 MG PO TABS
50.0000 mg | ORAL_TABLET | Freq: Four times a day (QID) | ORAL | Status: DC | PRN
  Administered 2021-11-27 – 2021-12-02 (×8): 50 mg via ORAL
  Filled 2021-11-25 (×8): qty 1

## 2021-11-25 MED ORDER — ONDANSETRON HCL 4 MG/2ML IJ SOLN: 4.0000 mg | Freq: Four times a day (QID) | INTRAMUSCULAR | Status: DC | PRN

## 2021-11-25 MED ORDER — CHLORHEXIDINE GLUCONATE CLOTH 2 % EX PADS
6.0000 | MEDICATED_PAD | Freq: Every day | CUTANEOUS | Status: DC
Start: 2021-11-26 — End: 2021-12-02
  Administered 2021-11-26 – 2021-12-01 (×6): 6 via TOPICAL

## 2021-11-25 MED ORDER — ORAL CARE MOUTH RINSE: 15.0000 mL | OROMUCOSAL | Status: DC | PRN

## 2021-11-25 MED ORDER — FUROSEMIDE 10 MG/ML IJ SOLN
40.0000 mg | Freq: Once | INTRAMUSCULAR | Status: AC
  Administered 2021-11-25: 40 mg via INTRAVENOUS
  Filled 2021-11-25: qty 4

## 2021-11-25 MED ORDER — TAMSULOSIN HCL 0.4 MG PO CAPS
0.4000 mg | ORAL_CAPSULE | Freq: Every day | ORAL | Status: DC
  Administered 2021-11-26 – 2021-12-03 (×8): 0.4 mg via ORAL
  Filled 2021-11-25 (×8): qty 1

## 2021-11-25 MED ORDER — ORAL CARE MOUTH RINSE
15.0000 mL | OROMUCOSAL | Status: DC
  Administered 2021-11-26 – 2021-12-03 (×21): 15 mL via OROMUCOSAL

## 2021-11-25 MED ORDER — LEVOTHYROXINE SODIUM 75 MCG PO TABS
75.0000 ug | ORAL_TABLET | Freq: Every day | ORAL | Status: DC
  Administered 2021-11-27 – 2021-12-03 (×7): 75 ug via ORAL
  Filled 2021-11-25 (×7): qty 1

## 2021-11-25 MED ORDER — DOCUSATE SODIUM 100 MG PO CAPS
100.0000 mg | ORAL_CAPSULE | Freq: Two times a day (BID) | ORAL | Status: DC | PRN
  Administered 2021-11-27 – 2021-12-01 (×3): 100 mg via ORAL
  Filled 2021-11-25 (×3): qty 1

## 2021-11-25 MED ORDER — SODIUM CHLORIDE 0.9 % IV SOLN
500.0000 mg | Freq: Once | INTRAVENOUS | Status: AC
  Administered 2021-11-25: 500 mg via INTRAVENOUS
  Filled 2021-11-25: qty 5

## 2021-11-25 MED ORDER — ACETAMINOPHEN 325 MG PO TABS
650.0000 mg | ORAL_TABLET | ORAL | Status: DC | PRN
  Administered 2021-12-01: 650 mg via ORAL
  Filled 2021-11-25 (×3): qty 2

## 2021-11-25 MED ORDER — CEFTRIAXONE SODIUM 1 G IJ SOLR
1.0000 g | Freq: Once | INTRAMUSCULAR | Status: AC
  Administered 2021-11-25: 1 g via INTRAVENOUS
  Filled 2021-11-25: qty 10

## 2021-11-25 MED ORDER — LABETALOL HCL 5 MG/ML IV SOLN
10.0000 mg | INTRAVENOUS | Status: DC | PRN
Start: 2021-11-25 — End: 2021-12-03
  Administered 2021-11-26 – 2021-11-27 (×2): 10 mg via INTRAVENOUS
  Filled 2021-11-25 (×2): qty 4

## 2021-11-25 MED ORDER — FOLIC ACID 1 MG PO TABS
1.0000 mg | ORAL_TABLET | Freq: Every day | ORAL | Status: DC
  Administered 2021-11-26 – 2021-12-03 (×8): 1 mg via ORAL
  Filled 2021-11-25 (×8): qty 1

## 2021-11-25 MED ORDER — IPRATROPIUM-ALBUTEROL 0.5-2.5 (3) MG/3ML IN SOLN
3.0000 mL | Freq: Four times a day (QID) | RESPIRATORY_TRACT | Status: DC | PRN
Start: 2021-11-25 — End: 2021-12-03

## 2021-11-25 MED ORDER — IPRATROPIUM-ALBUTEROL 0.5-2.5 (3) MG/3ML IN SOLN
3.0000 mL | RESPIRATORY_TRACT | Status: DC
  Administered 2021-11-25 – 2021-11-27 (×13): 3 mL via RESPIRATORY_TRACT
  Filled 2021-11-25 (×13): qty 3

## 2021-11-25 MED ORDER — MORPHINE SULFATE (PF) 2 MG/ML IV SOLN
2.0000 mg | INTRAVENOUS | Status: DC | PRN
  Administered 2021-11-25: 2 mg via INTRAVENOUS
  Filled 2021-11-25: qty 1

## 2021-11-25 MED ORDER — HEPARIN (PORCINE) 25000 UT/250ML-% IV SOLN
950.0000 [IU]/h | INTRAVENOUS | Status: DC
  Administered 2021-11-25: 950 [IU]/h via INTRAVENOUS
  Filled 2021-11-25: qty 250

## 2021-11-25 MED ORDER — SODIUM CHLORIDE 0.9 % IV SOLN
2.0000 g | INTRAVENOUS | Status: AC
  Administered 2021-11-26 – 2021-12-01 (×6): 2 g via INTRAVENOUS
  Filled 2021-11-25 (×6): qty 20

## 2021-11-25 MED ORDER — IOHEXOL 350 MG/ML SOLN
100.0000 mL | Freq: Once | INTRAVENOUS | Status: AC | PRN
  Administered 2021-11-25: 100 mL via INTRAVENOUS

## 2021-11-25 MED ORDER — SODIUM CHLORIDE 0.9 % IV SOLN
500.0000 mg | INTRAVENOUS | Status: DC
  Administered 2021-11-26 – 2021-11-30 (×5): 500 mg via INTRAVENOUS
  Filled 2021-11-25 (×5): qty 5

## 2021-11-25 MED ORDER — SODIUM CHLORIDE (PF) 0.9 % IJ SOLN
INTRAMUSCULAR | Status: AC
  Filled 2021-11-25: qty 50

## 2021-11-25 MED ORDER — HEPARIN BOLUS VIA INFUSION
1800.0000 [IU] | Freq: Once | INTRAVENOUS | Status: AC
  Administered 2021-11-25: 1800 [IU] via INTRAVENOUS
  Filled 2021-11-25: qty 1800

## 2021-11-25 MED ORDER — POLYETHYLENE GLYCOL 3350 17 G PO PACK
17.0000 g | PACK | Freq: Every day | ORAL | Status: DC | PRN
  Administered 2021-11-30: 17 g via ORAL
  Filled 2021-11-25 (×2): qty 1

## 2021-11-25 NOTE — Progress Notes (Signed)
eLink Physician-Brief Progress Note Patient Name: BRAE GARTMAN DOB: 08-04-1940 MRN: [id number]   Date of Service  11/25/2021  HPI/Events of Note  81/M with hx of hypertension, PAD, hypothyroidism, prior CVA, known aortic aneurysm, brought in the ED due to weakness and fall.  Pt was hypoxic with sats in the 30s.  Pt was gray but was answering questions appropriately.  Pt endorsed syncope. Pt with R humeral neck fracture.  Pt also hypercapnic with venous pH 7.28, pCO2 77. Pt transferred to the ICU on BIPAP.   On camera assessment, pt is awake and alert.  BP 169/101, HR 112, RR 25, O2 sats 96% on BIPAP.  CTA chest/pelvis IMPRESSION: 1. No evidence of thoracic aortic aneurysm, dissection, or other acute aortic pathology. 2. Fusiform aneurysm of the abdominal aorta, measuring up to 4.1 x 3.9 cm in the infrarenal portion of the vessel. No evidence of dissection or other acute aortic pathology. 3. 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 4. 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 5. Aneurysmal findings above are unchanged compared to prior examination. 6. Impacted, angulated fracture of the proximal right humerus. 7. Multiple wedge deformities of the lumbar vertebral bodies, status post interval vertebral cement augmentation of the L3 and L4 vertebral bodies. Unchanged high-grade L1 wedge deformity with a new, subtle superior endplate deformity of the L5 vertebral body, age indeterminate. Osteopenia. 8. Small bilateral pleural effusions and associated atelectasis or consolidation, right greater than left. 9. Scattered ground-glass airspace opacities throughout the lungs, primarily involving the right lung. Findings are consistent with nonspecific infection or inflammation. 10. Coronary artery disease.  eICU Interventions  Acute hypoxic and hypercapnic respiratory failure Lactic acidosis R humeral  neck fracture Pneumonia  Continue on empiric antibiotics.  Continue BIPAP.  ABG is improving. Continue to trend lactate. Pain control. Sling for comfort.  No immediate surgical indication as per Ortho.  Pt given lasix.  Monitor I&Os.  Pt started on heparin gtt as per protocol. Get VQ scan in the morning.        Intervention Category Evaluation Type: New Patient Evaluation  Elsie Lincoln 11/25/2021, 10:16 PM

## 2021-11-25 NOTE — ED Notes (Addendum)
Dr. Carlis Abbott paged to communicate lactic critical value, also communicated to elink lactic 7.9. Intervention: repeat lactic

## 2021-11-25 NOTE — Progress Notes (Signed)
[facility] Progress Note Patient Name: Ronnie Snyder DOB: 09-25-1940 MRN: [id number]   Date of Service  11/25/2021  HPI/Events of Note  Received request for pain medication for humeral neck fracture.  Pt also hypertensive with SBP in the 190s.   Pt is awake and alert on BIPAP.  eICU Interventions  Give morphine IV PRN.  Labetalol IV PRN ordered.     Intervention Category Intermediate Interventions: Pain - evaluation and management  Elsie Lincoln 11/25/2021, 11:17 PM

## 2021-11-25 NOTE — ED Notes (Signed)
Pt aware of need for urine sample. Offered male condom cath, refused. Assisted to use to the urinal, unable to provide sample. Provided education about upcoming lasix med admin, maintains that he would not like external cath.

## 2021-11-25 NOTE — ED Notes (Signed)
Critical value of PCO2 68 communicated with RN
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
## 2021-11-25 NOTE — Progress Notes (Signed)
Patient seen and examined in the ED.  Endorsing syncope today-- he does not remember falling,  and generally feeling unwell for several days.  No cough, fever, chills.  Mild foot edema, unsure timeline.  Has some pain in his right shoulder.  Tolerating BiPAP well.  Needs follow-up ABG.  Pain control may be problematic with her hypercapnia.  BP 130/81   Pulse (!) 102   Temp (!) 97.5 F (36.4 C) (Oral)   Resp (!) 22   SpO2 93%  Breathing comfortably on BiPAP Skin warm, dry Minimal muscle mass Awake and alert, answering questions appropriately  Full code Wife has dementia, son is with her currently.  -Start empiric heparin and order VQ scan tomorrow to ensure no PE to explain syncope, tachycardia, severe hypoxia and hypercapnia - Bronchodilators - Empiric pneumonia antibiotics   Full H&P to follow.  Julian Hy, DO 11/25/21 8:32 PM [facility]
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
## 2021-11-25 NOTE — ED Triage Notes (Signed)
BIB [facility]  Per EMS: Pt coming from home w/ c/o generalized weakness & fall today. Pt c/o right shoulder pain  20L AC  100/58BP  216CBG  Hx bowel obstruction

## 2021-11-25 NOTE — ED Provider Notes (Signed)
[place] DEPT Provider Note  CSN: [id number] Arrival date & time: 11/25/21 1706  Chief Complaint(s) Weakness and Fall  HPI KAYDENCE BABA is a 81 y.o. male with PMH HTN, hypothyroidism, peripheral arterial disease, previous CVA, previous lower GI bleed, known abdominal aortic aneurysm who presents emergency department for evaluation of weakness and a fall.  Patient states that this morning he was feeling more weak than usual, stepped out onto his porch and fell onto his right shoulder.  On arrival, EMS was unable to obtain a reliable O2 sat, but on arrival to the ER here at [facility], he was saturating 30% with a good Pleth.  Patient ashen in appearance and critically ill, but alert and oriented answering questions appropriately.  Denies chest pain, abdominal pain, fever or other systemic symptoms.   Past Medical History Past Medical History:  Diagnosis Date   Abdominal aortic aneurysm ([place])    3.5 cm 10/2016 L-spine CT (previously 3.0 cm by U/S 02/25/11)   Anxiety    Arthritis    "in my feet" (10/13/2012)   GERD (gastroesophageal reflux disease)    H/O hiatal hernia    Hepatic steatosis    High cholesterol    "at one time; it's fine now" (10/13/2012)   HTN (hypertension)    Hypothyroidism    PAD (peripheral artery disease) (HCC)    Skin cancer    "burned them off my arm and such" (10/13/2012)   Stroke ([place])    Tubular adenoma of colon 10/2019   Patient Active Problem List   Diagnosis Date Noted   Lumbar compression fracture ([place]) 05/02/2021   Weight loss 01/15/2021   Fatigue 05/30/2020   Benign neoplasm of colon    Gastric AVM    Iron deficiency anemia due to chronic blood loss 11/09/2019   Barrett's esophagus without dysplasia 08/30/2019   Steatosis of liver 08/19/2019   GI bleed 08/18/2019   Cerebral thrombosis with cerebral infarction 08/12/2019   Symptomatic anemia 08/09/2019   Acute respiratory failure with hypoxia ([place])  08/09/2019   Urinary hesitancy 10/26/2018   Night sweats 10/26/2018   Other specified hypothyroidism 05/29/2018   Lumbar stenosis 05/23/2017   Senile purpura ([place]) 09/23/2013   PAD (peripheral artery disease) ([place]) 08/09/2013   Aftercare following surgery of the circulatory system, NEC 02/01/2013   Hyponatremia 08/21/2012   Smoker 08/13/2012   Other and unspecified alcohol dependence, unspecified drinking behavior 08/13/2012   Screening for prostate cancer 08/13/2012   Antiplatelet or antithrombotic long-term use 08/13/2012   Atherosclerosis of native artery of extremity with intermittent claudication ([place]) 08/21/2011   ECZEMA 01/24/2010   COUGH 01/24/2009   AAA (abdominal aortic aneurysm) ([place]) 12/06/2008   HYPERCHOLESTEROLEMIA 01/21/2008   ERECTILE DYSFUNCTION, ORGANIC 01/21/2008   Back pain 01/21/2008   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 Authorizing Provider  acetaminophen (TYLENOL) 500 MG tablet Take 500 mg by mouth every 6 (six) hours as needed for mild pain or headache.    [provider]  amLODipine (NORVASC) 10 MG tablet TAKE 1 TABLET(10 MG) BY MOUTH DAILY 11/22/21   Luetta Nutting, DO  docusate sodium (COLACE) 100 MG capsule Take 100 mg  by mouth 2 (two) times daily.    [provider]  FEROSUL 325 (65 Fe) MG tablet TAKE 1 TABLET(325 MG) BY MOUTH DAILY WITH BREAKFAST 07/31/21   Luetta Nutting, DO  ferrous sulfate 325 (65 FE) MG tablet Take 1 tablet (325 mg total) by mouth daily with breakfast. 01/30/21 04/30/21  Luetta Nutting, DO  ferrous sulfate 325 (65 FE) MG tablet Take 325 mg by mouth daily.    [provider]   folic acid (FOLVITE) 1 MG tablet Take 1 mg by mouth daily.    [provider]  HYDROcodone-acetaminophen (NORCO/VICODIN) 5-325 MG tablet Take 1-2 tablets by mouth every 6 (six) hours as needed for moderate pain. Compression fracture lumbar Patient not taking: Reported on 05/22/2021 05/07/21   Marybelle Killings, MD  HYDROcodone-acetaminophen (NORCO/VICODIN) 5-325 MG tablet Take 1 tablet by mouth every 6 (six) hours as needed for moderate pain. 11/12/21   Marybelle Killings, MD  levothyroxine (SYNTHROID) 75 MCG tablet TAKE 1 TABLET(75 MCG) BY MOUTH DAILY BEFORE AND BREAKFAST 09/05/21   Luetta Nutting, DO  losartan (COZAAR) 100 MG tablet TAKE 1 TABLET(100 MG) BY MOUTH DAILY 10/29/21   Luetta Nutting, DO  methotrexate (RHEUMATREX) 2.5 MG tablet Take 15 mg by mouth once a week. Caution:Chemotherapy. Protect from light.    [provider]  OVER THE COUNTER MEDICATION Take 1 capsule by mouth daily. "Nerve Control 911" contains passion flower herb, marshmallow root, corydalis powder, prickly pear, [place] poppy seed.    [provider]  pantoprazole (PROTONIX) 40 MG tablet TAKE 1 TABLET(40 MG) BY MOUTH DAILY. NEED OFFICE VISIT FOR REFILLS. 08/16/21   Luetta Nutting, DO  tamsulosin (FLOMAX) 0.4 MG CAPS capsule TAKE 1 CAPSULE(0.4 MG) BY MOUTH DAILY 08/09/21   Luetta Nutting, DO  traMADol (ULTRAM) 50 MG tablet TAKE 1 TABLET(50 MG) BY MOUTH EVERY 6 HOURS AS NEEDED Patient not taking: Reported on 05/22/2021 09/19/20   Marybelle Killings, MD                                                                                                                                    Past Surgical History Past Surgical History:  Procedure Laterality Date   ABDOMINAL ANGIOGRAM N/A 12/04/2011   Procedure: ABDOMINAL ANGIOGRAM;  Surgeon: Sherren Mocha, MD;  Location: [facility] CATH LAB;  Service: Cardiovascular;  Laterality: N/A;   ABDOMINAL AORTAGRAM N/A 10/13/2012   Procedure: ABDOMINAL Maxcine Ham;  Surgeon: Serafina Mitchell, MD;   Location: [facility] CATH LAB;  Service: Cardiovascular;  Laterality: N/A;   ANGIOPLASTY / STENTING FEMORAL Left 10/13/2012   BIOPSY  11/11/2019   Procedure: BIOPSY;  Surgeon: Yetta Flock, MD;  Location: [place];  Service: Gastroenterology;;   COLONOSCOPY WITH PROPOFOL N/A 11/11/2019   Procedure: COLONOSCOPY WITH PROPOFOL;  Surgeon: Yetta Flock, MD;  Location: [place];  Service: Gastroenterology;  Laterality: N/A;   ESOPHAGOGASTRODUODENOSCOPY     ESOPHAGOGASTRODUODENOSCOPY (EGD) WITH  PROPOFOL N/A 11/11/2019   Procedure: ESOPHAGOGASTRODUODENOSCOPY (EGD) WITH PROPOFOL;  Surgeon: Yetta Flock, MD;  Location: [place];  Service: Gastroenterology;  Laterality: N/A;   FEMORAL ENDARTERECTOMY Left 01/23/12   Left Endarterectomy  with bovie patch Angioplasy   gated spect wall motion stress cardiolite  02/10/2002   HEMOSTASIS CLIP PLACEMENT  11/11/2019   Procedure: HEMOSTASIS CLIP PLACEMENT;  Surgeon: Yetta Flock, MD;  Location: [place] ENDOSCOPY;  Service: Gastroenterology;;   HEMOSTASIS CONTROL  11/11/2019   Procedure: HEMOSTASIS CONTROL;  Surgeon: Yetta Flock, MD;  Location: [facility] ENDOSCOPY;  Service: Gastroenterology;;   HIP FRACTURE SURGERY Right 1990's   HOT HEMOSTASIS N/A 11/11/2019   Procedure: HOT HEMOSTASIS (ARGON PLASMA COAGULATION/BICAP);  Surgeon: Yetta Flock, MD;  Location: [facility] ENDOSCOPY;  Service: Gastroenterology;  Laterality: N/A;   INGUINAL HERNIA REPAIR Right    "years ago" (10/13/2012)   IR KYPHO EA ADDL LEVEL THORACIC OR LUMBAR  06/07/2021   IR KYPHO LUMBAR INC FX REDUCE BONE BX UNI/BIL CANNULATION INC/IMAGING  06/07/2021   LUMBAR LAMINECTOMY/DECOMPRESSION MICRODISCECTOMY N/A 05/23/2017   Procedure: L4-5 DECOMPRESSION;  Surgeon: Marybelle Killings, MD;  Location: [place];  Service: Orthopedics;  Laterality: N/A;   NOSE SURGERY     POLYPECTOMY  11/11/2019   Procedure: POLYPECTOMY;  Surgeon: Yetta Flock, MD;  Location: [facility] ENDOSCOPY;  Service:  Gastroenterology;;   TONSILLECTOMY     "I was a chld" (10/13/2012)   Family History Family History  Problem Relation Age of Onset   Hypertension Mother    Heart disease Mother    Hypertension Father    Heart disease Father    Hypertension Sister    Hypertension Sister    Colon cancer Neg Hx    Esophageal cancer Neg Hx    Pancreatic cancer Neg Hx    Stomach cancer Neg Hx     Social History Social History   Tobacco Use   Smoking status: Every Day    Packs/day: 0.25    Years: 57.00    Total pack years: 14.25    Types: Cigarettes   Smokeless tobacco: Never   Tobacco comments:    pt states there is no hope for quitting, pt states he smokes 1-2 cigarettes a day, wife has copd and doesnt smoke around her  Vaping Use   Vaping Use: Never used  Substance Use Topics   Alcohol use: Not Currently   Drug use: No   Allergies Hydrocodone-acetaminophen, Atorvastatin, Other, Penicillins, Pioglitazone, and Vicodin [hydrocodone-acetaminophen]  Review of Systems Review of Systems  Respiratory:  Positive for shortness of breath.   Musculoskeletal:  Positive for arthralgias, joint swelling and myalgias.    Physical Exam Vital Signs  I have reviewed the triage vital signs BP (!) 92/58 (BP Location: Left Arm)   Pulse (!) 105   Temp (!) 97.5 F (36.4 C) (Oral)   Resp (!) 25   SpO2 92%   Physical Exam Constitutional:      General: He is in acute distress.     Appearance: Normal appearance. He is ill-appearing and toxic-appearing.  HENT:     Head: Normocephalic and atraumatic.     Nose: No congestion or rhinorrhea.  Eyes:     General:        Right eye: No discharge.        Left eye: No discharge.     Extraocular Movements: Extraocular movements intact.     Pupils: Pupils are equal, round, and reactive to light.  Cardiovascular:  Rate and Rhythm: Regular rhythm. Tachycardia present.     Heart sounds: No murmur heard. Pulmonary:     Effort: Respiratory distress present.      Breath sounds: No wheezing or rales.  Abdominal:     General: There is no distension.     Tenderness: There is no abdominal tenderness.  Musculoskeletal:        General: Swelling, tenderness and deformity present. Normal range of motion.     Cervical back: Normal range of motion.  Skin:    General: Skin is warm and dry.  Neurological:     General: No focal deficit present.     Mental Status: He is alert.     ED Results and Treatments Labs (all labs ordered are listed, but only abnormal results are displayed) Labs Reviewed  COMPREHENSIVE METABOLIC PANEL  CBC WITH DIFFERENTIAL/PLATELET  TSH  URINALYSIS, ROUTINE W REFLEX MICROSCOPIC  BLOOD GAS, VENOUS  TROPONIN I (HIGH SENSITIVITY)                                                                                                                          Radiology No results found.  Pertinent labs & imaging results that were available during my care of the patient were reviewed by me and considered in my medical decision making (see MDM for details).  Medications Ordered in ED Medications  lactated ringers bolus 1,000 mL (has no administration in time range)                                                                                                                                     Procedures .Critical Care  Performed by: Teressa Lower, MD Authorized by: Teressa Lower, MD   Critical care provider statement:    Critical care time (minutes):  80   Critical care was necessary to treat or prevent imminent or life-threatening deterioration of the following conditions:  Respiratory failure   Critical care was time spent personally by me on the following activities:  Development of treatment plan with patient or surrogate, discussions with consultants, evaluation of patient's response to treatment, examination of patient, ordering and review of laboratory studies, ordering and review of radiographic studies, ordering and  performing treatments and interventions, pulse oximetry, re-evaluation of patient's condition and review of old charts   (including critical care time)  Medical Decision Making / ED Course   This  patient presents to the ED for concern of shoulder pain, fall, weakness, this involves an extensive number of treatment options, and is a complaint that carries with it a high risk of complications and morbidity.  The differential diagnosis includes sepsis, electrolyte abnormality, pneumonia, aortic dissection, ruptured aortic aneurysm, pneumonia, COPD exacerbation  MDM: Patient seen emergency room for evaluation of multiple complaints described above.  Physical exam initially reveals a cachectic appearing elderly patient with severe tenderness to the right shoulder, increased accessory muscle use but is otherwise unremarkable.  After discovering patient's hypoxia to 30%, patient placed on a nonrebreather which improved his oxygen saturations and he was ultimately de-escalated down to 6 L high flow nasal cannula and maintained his oxygen saturations well.  Initial laboratory evaluation with a leukocytosis of 17.6, hemoglobin 11.0, AST 118, ALT 100.  Initial VBG with a pH of 7.28 with a PCO2 of 77.  Patient then transition to BiPAP.  While on the BiPAP at 30%, patient dropped his oxygen saturations to 80% and he was thus transitioned up to 100% FiO2.  CT dissection study was obtained that was reassuringly negative for dissection but does show a fusiform abdominal aortic aneurysm as well as a left common iliac artery aneurysm in the left common femoral artery aneurysm that are all unchanged from previous.  He does have a right humerus fracture, multiple wedge deformities of the lumbar vertebral bodies that are old, a new subtle superior endplate deformity of the L5 vertebral body as well as right greater than left pleural effusion versus consolidation as well as scattered groundglass opacities in the right lung  concerning for pneumonia.  CT head and C-spine unremarkable.  Patient started on ceftriaxone azithromycin and patient was admitted.   Orthopedics was consulted for his humerus fracture and is recommending sling and swath until his medical issues can be corrected prior to surgery.   Additional history obtained: -Additional history obtained from son -External records from outside source obtained and reviewed including: Chart review including previous notes, labs, imaging, consultation notes   Lab Tests: -I ordered, reviewed, and interpreted labs.   The pertinent results include:   Labs Reviewed  COMPREHENSIVE METABOLIC PANEL  CBC WITH DIFFERENTIAL/PLATELET  TSH  URINALYSIS, ROUTINE W REFLEX MICROSCOPIC  BLOOD GAS, VENOUS  TROPONIN I (HIGH SENSITIVITY)       Imaging Studies ordered: I ordered imaging studies including CT dissection study, CT head, CT C-spine, shoulder x-ray I independently visualized and interpreted imaging. I agree with the radiologist interpretation   Medicines ordered and prescription drug management: Meds ordered this encounter  Medications   lactated ringers bolus 1,000 mL    -I have reviewed the patients home medicines and have made adjustments as needed  Critical interventions BiPAP, antibiotics, fluid resuscitation  Consultations Obtained: I requested consultation with the orthopedic surgeon and intensivist,  and discussed lab and imaging findings as well as pertinent plan - they recommend: Medical admission   Cardiac Monitoring: The patient was maintained on a cardiac monitor.  I personally viewed and interpreted the cardiac monitored which showed an underlying rhythm of: Sinus tachycardia  Social Determinants of Health:  Factors impacting patients care include: none   Reevaluation: After the interventions noted above, I reevaluated the patient and found that they have :improved  Co morbidities that complicate the patient evaluation  Past  Medical History:  Diagnosis Date   Abdominal aortic aneurysm (HCC)    3.5 cm 10/2016 L-spine CT (previously 3.0 cm by U/S 02/25/11)   Anxiety  Arthritis    "in my feet" (10/13/2012)   GERD (gastroesophageal reflux disease)    H/O hiatal hernia    Hepatic steatosis    High cholesterol    "at one time; it's fine now" (10/13/2012)   HTN (hypertension)    Hypothyroidism    PAD (peripheral artery disease) (HCC)    Skin cancer    "burned them off my arm and such" (10/13/2012)   Stroke [facility])    Tubular adenoma of colon 10/2019      Dispostion: I considered admission for this patient, and given hypoxia, pneumonia and associated arm fracture, patient will require hospital admission     Final Clinical Impression(s) / ED Diagnoses Final diagnoses:  None     '@PCDICTATION'$ @    Teressa Lower, MD 11/25/21 2008

## 2021-11-25 NOTE — Progress Notes (Signed)
Pt transported to ICU on BIPAP without complication.  

## 2021-11-26 ENCOUNTER — Ambulatory Visit: Payer: Medicare Other | Admitting: Surgery

## 2021-11-26 ENCOUNTER — Inpatient Hospital Stay (HOSPITAL_COMMUNITY): Payer: Medicare Other

## 2021-11-26 DIAGNOSIS — J9601 Acute respiratory failure with hypoxia: Secondary | ICD-10-CM | POA: Diagnosis not present

## 2021-11-26 DIAGNOSIS — L89109 Pressure ulcer of unspecified part of back, unspecified stage: Secondary | ICD-10-CM | POA: Insufficient documentation

## 2021-11-26 DIAGNOSIS — R55 Syncope and collapse: Secondary | ICD-10-CM

## 2021-11-26 DIAGNOSIS — L8990 Pressure ulcer of unspecified site, unspecified stage: Secondary | ICD-10-CM | POA: Insufficient documentation

## 2021-11-26 DIAGNOSIS — J9602 Acute respiratory failure with hypercapnia: Secondary | ICD-10-CM | POA: Diagnosis not present

## 2021-11-26 LAB — BASIC METABOLIC PANEL
Anion gap: 12 (ref 5–15)
BUN: 26 mg/dL — ABNORMAL HIGH (ref 8–23)
CO2: 32 mmol/L (ref 22–32)
Calcium: 8.9 mg/dL (ref 8.9–10.3)
Chloride: 96 mmol/L — ABNORMAL LOW (ref 98–111)
Creatinine, Ser: 0.47 mg/dL — ABNORMAL LOW (ref 0.61–1.24)
GFR, Estimated: >60 mL/min (ref >60)
Glucose, Bld: 120 mg/dL — ABNORMAL HIGH (ref 70–99)
Potassium: 3.3 mmol/L — ABNORMAL LOW (ref 3.5–5.1)
Sodium: 140 mmol/L (ref 135–145)

## 2021-11-26 LAB — LIPID PANEL
Cholesterol: 102 mg/dL (ref 0–200)
HDL: 39 mg/dL — ABNORMAL LOW (ref >40)
LDL Cholesterol: 52 mg/dL (ref 0–99)
Total CHOL/HDL Ratio: 2.6 RATIO
Triglycerides: 53 mg/dL (ref <150)
VLDL: 11 mg/dL (ref 0–40)

## 2021-11-26 LAB — BLOOD CULTURE ID PANEL (REFLEXED) - BCID2

## 2021-11-26 LAB — RESPIRATORY PANEL BY PCR

## 2021-11-26 LAB — ECHOCARDIOGRAM COMPLETE
AR max vel: 1 cm2
AV Area VTI: 0.8 cm2
AV Area mean vel: 0.93 cm2
AV Mean grad: 29 mmHg
AV Peak grad: 50.2 mmHg
Ao pk vel: 3.54 m/s
Area-P 1/2: 7.16 cm2
Calc EF: 53.7 %
Height: 67 in
MV VTI: 2.01 cm2
S' Lateral: 3.3 cm
Single Plane A2C EF: 54.4 %
Single Plane A4C EF: 57.2 %
Weight: 1671.97 oz

## 2021-11-26 LAB — CBC
HCT: 34.9 % — ABNORMAL LOW (ref 39.0–52.0)
Hemoglobin: 11.3 g/dL — ABNORMAL LOW (ref 13.0–17.0)
MCH: 34.1 pg — ABNORMAL HIGH (ref 26.0–34.0)
MCHC: 32.4 g/dL (ref 30.0–36.0)
MCV: 105.4 fL — ABNORMAL HIGH (ref 80.0–100.0)
Platelets: 240 10*3/uL (ref 150–400)
RBC: 3.31 MIL/uL — ABNORMAL LOW (ref 4.22–5.81)
RDW: 14.8 % (ref 11.5–15.5)
WBC: 20.9 10*3/uL — ABNORMAL HIGH (ref 4.0–10.5)
nRBC: 0.1 % (ref 0.0–0.2)

## 2021-11-26 LAB — APTT: aPTT: 57 seconds — ABNORMAL HIGH (ref 24–36)

## 2021-11-26 LAB — HEMOGLOBIN AND HEMATOCRIT, BLOOD
HCT: 32.6 % — ABNORMAL LOW (ref 39.0–52.0)
Hemoglobin: 10.5 g/dL — ABNORMAL LOW (ref 13.0–17.0)

## 2021-11-26 LAB — PROTIME-INR
INR: 1.3 — ABNORMAL HIGH (ref 0.8–1.2)
Prothrombin Time: 15.6 seconds — ABNORMAL HIGH (ref 11.4–15.2)

## 2021-11-26 LAB — BLOOD GAS, VENOUS
Acid-Base Excess: 7.2 mmol/L — ABNORMAL HIGH (ref 0.0–2.0)
Bicarbonate: 36.2 mmol/L — ABNORMAL HIGH (ref 20.0–28.0)
O2 Saturation: 32.7 %
Patient temperature: 37
pCO2, Ven: 77 mmHg (ref 44–60)
pH, Ven: 7.28 (ref 7.25–7.43)
pO2, Ven: <31 mmHg — CL (ref 32–45)

## 2021-11-26 LAB — TYPE AND SCREEN
ABO/RH(D): AB POS
Antibody Screen: NEGATIVE

## 2021-11-26 LAB — D-DIMER, QUANTITATIVE: D-Dimer, Quant: 2.81 ug/mL-FEU — ABNORMAL HIGH (ref 0.00–0.50)

## 2021-11-26 LAB — TROPONIN I (HIGH SENSITIVITY)
Troponin I (High Sensitivity): 317 ng/L (ref <18)
Troponin I (High Sensitivity): 471 ng/L (ref <18)

## 2021-11-26 LAB — HEPARIN LEVEL (UNFRACTIONATED)
Heparin Unfractionated: 0.2 IU/mL — ABNORMAL LOW (ref 0.30–0.70)
Heparin Unfractionated: 0.14 IU/mL — ABNORMAL LOW (ref 0.30–0.70)
Heparin Unfractionated: 0.33 IU/mL (ref 0.30–0.70)

## 2021-11-26 LAB — LACTIC ACID, PLASMA
Lactic Acid, Venous: 2.6 mmol/L (ref 0.5–1.9)
Lactic Acid, Venous: 1.1 mmol/L (ref 0.5–1.9)

## 2021-11-26 LAB — STREP PNEUMONIAE URINARY ANTIGEN: Strep Pneumo Urinary Antigen: NEGATIVE

## 2021-11-26 MED ORDER — SODIUM CHLORIDE 0.9 % IV SOLN: INTRAVENOUS | Status: DC | PRN

## 2021-11-26 MED ORDER — FENTANYL CITRATE PF 50 MCG/ML IJ SOSY: 0.0000 ug | PREFILLED_SYRINGE | INTRAMUSCULAR | Status: DC | PRN

## 2021-11-26 MED ORDER — ASPIRIN 81 MG PO TBEC
81.0000 mg | DELAYED_RELEASE_TABLET | Freq: Every day | ORAL | Status: DC
  Administered 2021-11-26 – 2021-12-03 (×7): 81 mg via ORAL
  Filled 2021-11-26 (×7): qty 1

## 2021-11-26 MED ORDER — IOHEXOL 350 MG/ML SOLN
75.0000 mL | Freq: Once | INTRAVENOUS | Status: AC | PRN
  Administered 2021-11-26: 75 mL via INTRAVENOUS

## 2021-11-26 MED ORDER — HEPARIN (PORCINE) 25000 UT/250ML-% IV SOLN
1100.0000 [IU]/h | INTRAVENOUS | Status: DC
  Administered 2021-11-26: 1100 [IU]/h via INTRAVENOUS

## 2021-11-26 MED ORDER — HEPARIN BOLUS VIA INFUSION
1000.0000 [IU] | Freq: Once | INTRAVENOUS | Status: AC
  Administered 2021-11-26: 1000 [IU] via INTRAVENOUS
  Filled 2021-11-26: qty 1000

## 2021-11-26 MED ORDER — ROSUVASTATIN CALCIUM 20 MG PO TABS
20.0000 mg | ORAL_TABLET | Freq: Every day | ORAL | Status: DC
  Administered 2021-11-26 – 2021-12-03 (×8): 20 mg via ORAL
  Filled 2021-11-26 (×8): qty 1

## 2021-11-26 NOTE — Progress Notes (Signed)
Consulted cardiology

## 2021-11-26 NOTE — TOC Initial Note (Signed)
Transition of Care [facility]) - Initial/Assessment Note    Patient Details  Name: Ronnie Snyder MRN: [id number] Date of Birth: 10-05-1940  [facility] CM/SW Contact:    Leeroy Cha, RN Phone Number: 11/26/2021, 12:56 PM  Clinical Narrative:                 May need snf placememtn or home with hospice care.  Unable to state at this time due to condition.  Wife is the contact.  Expected Discharge Plan: Home w Hospice Care Barriers to Discharge: Continued Medical Work up   Patient Goals and CMS Choice Patient states their goals for this hospitalization and ongoing recovery are:: noty stated      Expected Discharge Plan and Services Expected Discharge Plan: [place]   Discharge Planning Services: CM Consult   Living arrangements for the past 2 months: Single Family Home                                      Prior Living Arrangements/Services Living arrangements for the past 2 months: Single Family Home Lives with:: Spouse Patient language and need for interpreter reviewed:: Yes              Criminal Activity/Legal Involvement Pertinent to Current Situation/Hospitalization: No - Comment as needed  Activities of Daily Living      Permission Sought/Granted                  Emotional Assessment Appearance:: Appears stated age     Orientation: : Fluctuating Orientation (Suspected and/or reported [place])   Psych Involvement: No (comment)  Admission diagnosis:  Acute respiratory failure with hypoxia and hypercapnia ([place]) [J96.01, J96.02] Patient Active Problem List   Diagnosis Date Noted   Pressure injury of skin 11/26/2021   Acute respiratory failure with hypoxia and hypercapnia ([place]) 11/25/2021   Lumbar compression fracture ([place]) 05/02/2021   Weight loss 01/15/2021   Fatigue 05/30/2020   Benign neoplasm of colon    Gastric AVM    Iron deficiency anemia due to chronic blood loss 11/09/2019   Barrett's esophagus  without dysplasia 08/30/2019   Steatosis of liver 08/19/2019   GI bleed 08/18/2019   Cerebral thrombosis with cerebral infarction 08/12/2019   Symptomatic anemia 08/09/2019   Acute respiratory failure with hypoxia ([place]) 08/09/2019   Urinary hesitancy 10/26/2018   Night sweats 10/26/2018   Other specified hypothyroidism 05/29/2018   Lumbar stenosis 05/23/2017   Senile purpura ([place]) 09/23/2013   PAD (peripheral artery disease) ([place]) 08/09/2013   Aftercare following surgery of the circulatory system, NEC 02/01/2013   Hyponatremia 08/21/2012   Smoker 08/13/2012   Other and unspecified alcohol dependence, unspecified drinking behavior 08/13/2012   Screening for prostate cancer 08/13/2012   Antiplatelet or antithrombotic long-term use 08/13/2012   Atherosclerosis of native artery of extremity with intermittent claudication ([place]) 08/21/2011   ECZEMA 01/24/2010   COUGH 01/24/2009   AAA (abdominal aortic aneurysm) ([place]) 12/06/2008   HYPERCHOLESTEROLEMIA 01/21/2008   ERECTILE DYSFUNCTION, ORGANIC 01/21/2008   Back pain 01/21/2008   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   Anxiety state 03/07/2007   ERECTILE DYSFUNCTION 03/07/2007   DEPRESSION 03/07/2007   HTN (hypertension) 03/07/2007   CAROTID ARTERY STENOSIS 03/07/2007   HIP PAIN, RIGHT 03/07/2007   COLONIC POLYPS, HX OF 03/07/2007   ADENOIDECTOMY, HX OF 03/07/2007   PCP:  Luetta Nutting, DO Pharmacy:   [facility] [place] [place] - [place] AT [facility] OF [address] & [address] [place] [address] Phone: [phone] Fax: [phone]  [facility] [facility] - [place], [place] [address] Phone: [phone] Fax: [phone]     Social Determinants of Health (SDOH) Interventions    Readmission Risk Interventions     No data to display

## 2021-11-26 NOTE — Progress Notes (Signed)
ANDERSEN IORIO  MRN: [id number] DOB/Age: 06-07-1940 81 y.o. [facility]      Subjective: Dr. Doran Durand asked [place] to evaluate and assume care for Mr. Agne's right proximal humerus fracture  Vital Signs Temp:  [97.5 F (36.4 C)-98.5 F (36.9 C)] 98.5 F (36.9 C) (07/10 0736) Pulse Rate:  [83-118] 108 (07/10 0700) Resp:  [16-35] 22 (07/10 0700) BP: (92-192)/(58-126) 174/103 (07/10 0700) SpO2:  [87 %-99 %] 90 % (07/10 0736) FiO2 (%):  [50 %-70 %] 55 % (07/10 0312) Weight:  [47.4 kg-49.9 kg] 47.4 kg (07/09 2215)  Lab Results Recent Labs    11/25/21 1740 11/25/21 1742 11/26/21 0303  WBC 17.6*  --  20.9*  HGB 11.0* 11.6* 11.3*  HCT 34.7* 34.0* 34.9*  PLT 242  --  240   BMET Recent Labs    11/25/21 1740 11/25/21 1742 11/26/21 0802  NA 139 134* 140  K 4.7 4.7 3.3*  CL 97* 93* 96*  CO2 32  --  32  GLUCOSE 181* 180* 120*  BUN 32* 37* 26*  CREATININE 0.62 0.60* 0.47*  CALCIUM 9.0  --  8.9   INR  Date Value Ref Range Status  11/25/2021 1.3 (H) 0.8 - 1.2 Final    Comment:    (NOTE) INR goal varies based on device and disease states. Performed at [facility], [place] [address], [place], [place] [address]      Radiographs 2 views of right humerus on admission reveal displaced and angulated right proximal humerus fracture.        Plan Given the amount of displacement he could potentially benefit from surgical approach for his right proximal humerus fracture however given his current medical status we would recommend conservative management and continue with sling immobilization.  We will continue to follow along and as his medical status/respiratory failure improves we could potentially consider surgery at a later date vs ongoing conservative management.  Jenetta Loges PA-C for Dr. Justice Britain 11/26/2021, 11:10 AM Contact # [phone]

## 2021-11-26 NOTE — Progress Notes (Signed)
PHARMACY - PHYSICIAN COMMUNICATION CRITICAL VALUE ALERT - BLOOD CULTURE IDENTIFICATION (BCID)  Ronnie Snyder is an 81 y.o. male who presented to [facility] on 11/25/2021 with a chief complaint of fall; found to have acute resp failure.  Assessment:  1/4 BCx bottles growing sensitive Staph epi (suspect contamination, no mention of retained hardware in notes)  Name of physician (or Provider) Contacted: [place]  Current antibiotics: Azithromycin, Rocephin 2g/d  Changes to prescribed antibiotics recommended:  Patient is on recommended antibiotics - No changes needed  Results for orders placed or performed during the hospital encounter of 11/25/21  Blood Culture ID Panel (Reflexed) (Collected: 11/25/2021  6:09 PM)  Result Value Ref Range   Enterococcus faecalis NOT DETECTED NOT DETECTED   Enterococcus Faecium NOT DETECTED NOT DETECTED   Listeria monocytogenes NOT DETECTED NOT DETECTED   Staphylococcus species DETECTED (A) NOT DETECTED   Staphylococcus aureus (BCID) NOT DETECTED NOT DETECTED   Staphylococcus epidermidis DETECTED (A) NOT DETECTED   Staphylococcus lugdunensis NOT DETECTED NOT DETECTED   Streptococcus species NOT DETECTED NOT DETECTED   Streptococcus agalactiae NOT DETECTED NOT DETECTED   Streptococcus pneumoniae NOT DETECTED NOT DETECTED   Streptococcus pyogenes NOT DETECTED NOT DETECTED   A.calcoaceticus-baumannii NOT DETECTED NOT DETECTED   Bacteroides fragilis NOT DETECTED NOT DETECTED   Enterobacterales NOT DETECTED NOT DETECTED   Enterobacter cloacae complex NOT DETECTED NOT DETECTED   Escherichia coli NOT DETECTED NOT DETECTED   Klebsiella aerogenes NOT DETECTED NOT DETECTED   Klebsiella oxytoca NOT DETECTED NOT DETECTED   Klebsiella pneumoniae NOT DETECTED NOT DETECTED   Proteus species NOT DETECTED NOT DETECTED   Salmonella species NOT DETECTED NOT DETECTED   Serratia marcescens NOT DETECTED NOT DETECTED   Haemophilus influenzae NOT DETECTED NOT DETECTED    Neisseria meningitidis NOT DETECTED NOT DETECTED   Pseudomonas aeruginosa NOT DETECTED NOT DETECTED   Stenotrophomonas maltophilia NOT DETECTED NOT DETECTED   Candida albicans NOT DETECTED NOT DETECTED   Candida auris NOT DETECTED NOT DETECTED   Candida glabrata NOT DETECTED NOT DETECTED   Candida krusei NOT DETECTED NOT DETECTED   Candida parapsilosis NOT DETECTED NOT DETECTED   Candida tropicalis NOT DETECTED NOT DETECTED   Cryptococcus neoformans/gattii NOT DETECTED NOT DETECTED   Methicillin resistance mecA/C NOT DETECTED NOT DETECTED    [name] A 11/26/2021  5:57 PM

## 2021-11-26 NOTE — Progress Notes (Signed)
eLink Physician-Brief Progress Note Patient Name: Ronnie Snyder DOB: 05/29/1940 MRN: [id number]   Date of Service  11/26/2021  HPI/Events of Note  Patient has gross hematuria, he is on a Heparin gtt, Troponin's are significantly elevated but 2-D echocardiogram from today is without RWMA, suspect that Troponin bump is secondary to demand. CTA chest was negative for PE.  eICU Interventions  Will hold Heparin gtt continue ASA 81 mg daily, trend Troponin and H & H, send urinalysis, and Type & Screen patient.        Kerry Kass Dawne Casali 11/26/2021, 7:51 PM

## 2021-11-26 NOTE — Consult Note (Signed)
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 subtle superior endplate deformity of the L5 vertebral body, age indeterminate. Osteopenia. 8. Small bilateral pleural effusions and associated atelectasis or consolidation, right greater than left. 9. Scattered ground-glass airspace opacities throughout the lungs, primarily involving the right lung. Findings are consistent with nonspecific infection or inflammation. 10. Coronary artery disease. Aortic Atherosclerosis (ICD10-I70.0) and Emphysema (ICD10-J43.9). Electronically Signed   By: Delanna Ahmadi M.D.   On: 11/25/2021 18:28   DG Shoulder Right  Result Date: 11/25/2021 CLINICAL DATA:  Fall. EXAM: RIGHT SHOULDER - 2+ VIEW COMPARISON:  None Available. FINDINGS: There is an acute oblique fracture through the right humeral neck. There is 1 shaft with anterior displacement of the distal fracture fragment with apex anterior angulation. There is no dislocation. There are mild degenerative changes of the acromioclavicular joint. There is soft tissue swelling surrounding the fracture. IMPRESSION:  Displaced right humeral neck fracture. Electronically Signed   By: Ronney Asters M.D.   On: 11/25/2021 18:20   CT HEAD WO CONTRAST (5MM)  Result Date: 11/25/2021 CLINICAL DATA:  Fall head trauma EXAM: CT HEAD WITHOUT CONTRAST CT CERVICAL SPINE WITHOUT CONTRAST TECHNIQUE: Multidetector CT imaging of the head and cervical spine was performed following the standard protocol without intravenous contrast. Multiplanar CT image reconstructions of the cervical spine were also generated. RADIATION DOSE REDUCTION: This exam was performed according to the departmental dose-optimization program which includes automated exposure control, adjustment of the mA and/or kV according to patient size and/or use of iterative reconstruction technique. COMPARISON:  None Available. FINDINGS: CT HEAD FINDINGS Brain:  No evidence of acute infarction, hemorrhage, hydrocephalus, extra-axial collection or mass lesion/mass effect. Vascular: No hyperdense vessel or unexpected calcification. Skull: Normal. Negative for fracture or focal lesion. Sinuses/Orbits: No acute finding. Other: None. CT CERVICAL SPINE FINDINGS Alignment: Normal. Skull base and vertebrae: No acute fracture. No primary bone lesion or focal pathologic process. Soft tissues and spinal canal: No prevertebral fluid or swelling. No visible canal hematoma. Disc levels: Mild disc space height loss and osteophytosis throughout. Upper chest: Negative. Other: None. IMPRESSION: 1. No acute intracranial pathology. 2. No fracture or static subluxation of the cervical spine. 3. Mild multilevel cervical disc degenerative disease. Electronically Signed   By: Delanna Ahmadi M.D.   On: 11/25/2021 18:16   CT Cervical Spine Wo Contrast  Result Date: 11/25/2021 CLINICAL DATA:  Fall head trauma EXAM: CT HEAD WITHOUT CONTRAST CT CERVICAL SPINE WITHOUT CONTRAST TECHNIQUE: Multidetector CT imaging of the head and cervical spine was performed following the standard protocol without intravenous  contrast. Multiplanar CT image reconstructions of the cervical spine were also generated. RADIATION DOSE REDUCTION: This exam was performed according to the departmental dose-optimization program which includes automated exposure control, adjustment of the mA and/or kV according to patient size and/or use of iterative reconstruction technique. COMPARISON:  None Available. FINDINGS: CT HEAD FINDINGS Brain: No evidence of acute infarction, hemorrhage, hydrocephalus, extra-axial collection or mass lesion/mass effect. Vascular: No hyperdense vessel or unexpected calcification. Skull: Normal. Negative for fracture or focal lesion. Sinuses/Orbits: No acute finding. Other: None. CT CERVICAL SPINE FINDINGS Alignment: Normal. Skull base and vertebrae: No acute fracture. No primary bone lesion or focal pathologic process. Soft tissues and spinal canal: No prevertebral fluid or swelling. No visible canal hematoma. Disc levels: Mild disc space height loss and osteophytosis throughout. Upper chest: Negative. Other: None. IMPRESSION: 1. No acute intracranial pathology. 2. No fracture or static subluxation of the cervical spine. 3. Mild multilevel cervical disc degenerative disease. Electronically Signed   By: Delanna Ahmadi M.D.   On: 11/25/2021 18:16     Assessment and Plan:   ***   Risk Assessment/Risk Scores:  {Complete the following score calculators/questions to meet required metrics.  Press F2         :[id number]}   {Is the patient being seen for CHEST PAIN, UNSTABLE ANGINA, NSTEMI or STEMI?     :[id number]} {Does this patient have CHF or CHF symptoms?      :[id number]} {Does this patient have ATRIAL FIBRILLATION?:[phone]}  {Are we signing off today?:[id number]}  For questions or updates, please contact [place] Please consult [email or web address] for contact info under    Signed, Darreld Mclean, PA-C  11/26/2021 9:15 PM

## 2021-11-26 NOTE — Progress Notes (Addendum)
ANTICOAGULATION CONSULT NOTE - Follow Up Consult  Pharmacy Consult for Heparin Indication: suspected PE  Allergies  Allergen Reactions   Hydrocodone-Acetaminophen Nausea Only and Other (See Comments)    Vertigo, also   Atorvastatin Nausea Only   Other Nausea Only and Other (See Comments)    Vertigo and nausea: "ALL PAINS/NARCOTIC MEDS-CANNOT TOLERATE WELL,"  per Med History prior to 05/23/17   Penicillins Rash    Has patient had a PCN reaction causing immediate rash, facial/tongue/throat swelling, SOB or lightheadedness with hypotension: Unknown Has patient had a PCN reaction causing severe rash involving mucus membranes or skin necrosis: No Has patient had a PCN reaction that required hospitalization: No Has patient had a PCN reaction occurring within the last 10 years: No If all of the above answers are "NO", then may proceed with Cephalosporin use.    Pioglitazone Nausea Only    Patient Measurements: Height: '5\' 7"'$  (170.2 cm) Weight: 47.4 kg (104 lb 8 oz) IBW/kg (Calculated) : 66.1 Heparin Dosing Weight: TBW  Vital Signs: Temp: 97.6 F (36.4 C) (07/10 1600) Temp Source: Oral (07/10 1600) BP: 125/71 (07/10 2000) Pulse Rate: 89 (07/10 2000)  Labs: Recent Labs    11/25/21 1740 11/25/21 1742 11/25/21 1957 11/25/21 2313 11/26/21 0303 11/26/21 0802 11/26/21 1830  HGB 11.0* 11.6*  --   --  11.3*  --   --   HCT 34.7* 34.0*  --   --  34.9*  --   --   PLT 242  --   --   --  240  --   --   APTT  --   --   --  57*  --   --   --   LABPROT  --   --   --  15.6*  --   --   --   INR  --   --   --  1.3*  --   --   --   HEPARINUNFRC  --   --   --   --  0.20* 0.14* 0.33  CREATININE 0.62 0.60*  --   --   --  0.47*  --   TROPONINIHS 136*  --  89* 317* 471*  --   --      Estimated Creatinine Clearance: 49.4 mL/min (A) (by C-G formula based on SCr of 0.47 mg/dL (L)).   Medications:  Infusions:   sodium chloride     azithromycin (ZITHROMAX) 500 mg in sodium chloride 0.9 % 250  mL IVPB     cefTRIAXone (ROCEPHIN)  IV      Assessment: 81 year old male presented with weakness and fall, hypoxia and hypercapnia. Pharmacy consulted to start heparin while waiting for dedicated CTa to rule out PE. CT on 7/9 shows aneurysm of abdominal aorta, left common iliac artery and left common femoral artery; these are chronic and stable from previous imaging. Okay to bolus/dose heparin as normal per CCMD. No anticoagulation noted PTA.  Today, 11/26/2021: Most recent heparin level therapeutic on 1100 units/hr RN called to report gross hematuria in Foley just as heparin level returning AM CBC:  Hgb low/stable at 11.3, Plt WNL - CBC q6 ordered per CCM   Goal of Therapy:  Heparin level 0.3-0.7 units/ml Monitor platelets by anticoagulation protocol: Yes   Plan:  Per CCM, hold heparin pending resolution of hematuria Labs per MD Will d/c heparin per Pharmacy consult; f/u ability to resume anticoagulation  Reuel Boom, PharmD, BCPS [phone] 11/26/2021, 8:26 PM

## 2021-11-26 NOTE — Progress Notes (Signed)
NAME:  Ronnie Snyder, MRN:  [id number], DOB:  Feb 04, 1941, LOS: 1 ADMISSION DATE:  11/25/2021, CONSULTATION DATE:  7/9 REFERRING MD:  Ester Rink, CHIEF COMPLAINT: Hypoxia  History of Present Illness:  Ronnie Snyder is an 81 year old gentleman who presented to the ED after a fall today.  He does not remember falling and reports suddenly losing consciousness.  He has been feeling poorly for several days.  He denies fever, chills, sweats, productive cough, chest pain.  No history of blood clots or heart disease.  No previously known diagnosis of pulmonary disease, but his wife has COPD.  He continues to smoke cigarettes occasionally, but has overall cut back from how much he used to smoke.  He is not very active and ambulates with a cane.  He would not be able to walk a block outside.  In the ED he was found to be profoundly hypoxic with saturations in the 30s and gray skin upon presentation that improved quickly with administration of oxygen via nonrebreather.  He did not have an ABG done to confirm hypoxia.  VBG demonstrated hypercapnia and he was started on BiPAP.  He endorses discomfort with BiPAP and has right shoulder pain from his fall.  CT scan demonstrated proximal humerus fracture.  PCCM was consulted for respiratory failure.  Pertinent  Medical History  AAA, GERD, hypertension, hyperlipidemia, hypothyroidism, PAD, stroke.  Significant Hospital Events: Including procedures, antibiotic start and stop dates in addition to other pertinent events   Admitted 11/25/2021  Interim History / Subjective:  Asking to come off BiPAP and eat. Taken off BiPAP, needed 6L to maintain sat > 90. Comfortable, watching TV in no distress. Shoulder pain "so-so".  Objective   Blood pressure (!) 174/103, pulse (!) 108, temperature 98.3 F (36.8 C), temperature source Axillary, resp. rate (!) 22, height '5\' 7"'$  (1.702 m), weight 47.4 kg, SpO2 99 %.    FiO2 (%):  [50 %-70 %] 55 %   Intake/Output Summary  (Last 24 hours) at 11/26/2021 0736 Last data filed at 11/26/2021 0700 Gross per 24 hour  Intake 448.86 ml  Output 200 ml  Net 248.86 ml   Filed Weights   11/25/21 2110 11/25/21 2215  Weight: 49.9 kg 47.4 kg    Examination: General: Elderly male, resting in bed, in NAD. Neuro: A&O x 3, no deficits. HEENT: [place]/AT. Sclerae anicteric. EOMI. Cardiovascular: RRR, no M/R/G.  Lungs: Respirations even and unlabored.  CTA bilaterally, No W/R/R. Abdomen: BS x 4, soft, NT/ND.  Musculoskeletal: R arm in sling, decreased ROM at shoulder but suprisingly not in much pain. No gross deformities, no edema.  Skin: Intact, warm, no rashes.  Assessment & Plan:   Acute respiratory failure with hypoxia and hypercapnia, we have a potential right lower lobe community-acquired pneumonia and acute COPD exacerbation.  Bilateral pleural effusions raise concern for undiagnosed heart disease. R/o PE given hypoxia with syncope + elevated D-dimer and trop - BiPAP as needed - Supplemental O2 for sats < 90 while off BiPAP - Continue empiric heparin gtt - Get dedicated CTA to r/o PE today if renal function OK, BMP pending (CTA dissection study yesterday not perfectly phased to evaluate for PE). - If has AKI after initial contrast load, will get VQ. - Continue bronchodilators.  Will try to hold off on steroids as much as possible given acute fracture.  If hypercapnia is more persistent, steroids likely will be required. - Empiric antibiotics-ceftriaxone and azithromycin - F/u on Legionella antigen (pneumococcal neg) - Respiratory culture if  able to collect - Pulmonary hygiene  Syncope - f/u on Echocardiogram - Monitor on telemetry - CTA chest vs VQ as above - Recently had vicodin filled at the end of June for 5 day supply-- unsure why - Needs full medication reconciliation.  He is not able to tell us what medications he takes.  Pharmacy is working on this.  Troponin leak - NSTEMI vs PE. EKG neg. - Continue  heparin gtt - f/u on Echo - F/u on CTA, if negative then will ask cards to evaluate - Statin, ASA  Displaced right humeral neck fracture status post fall Fall Possible superior endplate deformity of L5 vertebral body, age-indeterminate - Ortho consulted, NWB RUE for now, further recs to come but doesn't appear as any immediate surgical plans - Low dose fentanyl for pain, cautious with oversedation - Maintain RUE sling - PT/OT when able but f/u with ortho recs first  AAA, h/p PAD - Needs outpatient follow-up when stable - Statin, aspirin  GERD - unsure if he takes meds for this PTA  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 - Hold methotrexate due to concern for infection - Continue folic acid  Hypertension - PRN labetalol - Hold PTA antihypertensives, need to clarify which medications he is taking at home  Hypothyroidism - Synthroid   Best Practice (right click and "Reselect all SmartList Selections" daily)   Diet/type: Regular consistency (see orders) DVT prophylaxis: systemic heparin GI prophylaxis: N/A Lines: N/A Foley:  N/A Code Status:  full code Last date of multidisciplinary goals of care discussion '[ ]'$     Ronnie Hora, PA - [facility] For pager details, please see AMION or use Epic chat  After 1900, please call [place] for cross coverage needs 11/26/2021, 7:54 AM

## 2021-11-26 NOTE — Progress Notes (Signed)
ANTICOAGULATION CONSULT NOTE - Follow Up Consult  Pharmacy Consult for Heparin Indication: suspected PE  Allergies  Allergen Reactions   Hydrocodone-Acetaminophen Nausea Only and Other (See Comments)    Vertigo, also   Atorvastatin Nausea Only   Other Nausea Only and Other (See Comments)    Vertigo and nausea: "ALL PAINS/NARCOTIC MEDS-CANNOT TOLERATE WELL,"  per Med History prior to 05/23/17   Penicillins Rash    Has patient had a PCN reaction causing immediate rash, facial/tongue/throat swelling, SOB or lightheadedness with hypotension: Unknown Has patient had a PCN reaction causing severe rash involving mucus membranes or skin necrosis: No Has patient had a PCN reaction that required hospitalization: No Has patient had a PCN reaction occurring within the last 10 years: No If all of the above answers are "NO", then may proceed with Cephalosporin use.    Pioglitazone Nausea Only    Patient Measurements: Height: '5\' 7"'$  (170.2 cm) Weight: 47.4 kg (104 lb 8 oz) IBW/kg (Calculated) : 66.1 Heparin Dosing Weight: TBW  Vital Signs: Temp: 98.3 F (36.8 C) (07/10 0400) Temp Source: Axillary (07/10 0400) BP: 174/103 (07/10 0700) Pulse Rate: 108 (07/10 0700)  Labs: Recent Labs    11/25/21 1740 11/25/21 1742 11/25/21 1957 11/25/21 2313 11/26/21 0303  HGB 11.0* 11.6*  --   --  11.3*  HCT 34.7* 34.0*  --   --  34.9*  PLT 242  --   --   --  240  APTT  --   --   --  57*  --   LABPROT  --   --   --  15.6*  --   INR  --   --   --  1.3*  --   HEPARINUNFRC  --   --   --   --  0.20*  CREATININE 0.62 0.60*  --   --   --   TROPONINIHS 136*  --  89* 317* 471*    Estimated Creatinine Clearance: 49.4 mL/min (A) (by C-G formula based on SCr of 0.6 mg/dL (L)).   Medications:  Infusions:   azithromycin (ZITHROMAX) 500 mg in sodium chloride 0.9 % 250 mL IVPB     cefTRIAXone (ROCEPHIN)  IV     heparin 950 Units/hr (11/26/21 0700)    Assessment: 81 year old male presented with weakness  and fall, hypoxia and hypercapnia. Pharmacy consulted to start heparin while waiting for dedicated CTa to rule out PE. CT on 7/9 shows aneurysm of abdominal aorta, left common iliac artery and left common femoral artery; these are chronic and stable from previous imaging. Okay to bolus/dose heparin as normal per CCMD. No anticoagulation noted PTA.  Today, 11/26/2021: Heparin level 0.2 was drawn 4 hours early, disregard results Heparin level 0.14, drawn at 8:02, but not processed until 10:01.  Subtherapeutic on heparin 950 units/hr. CBC:  Hgb low/stable at 11.3, Plt WNL No bleeding or complications reported.   Goal of Therapy:  Heparin level 0.3-0.7 units/ml Monitor platelets by anticoagulation protocol: Yes   Plan:  Give heparin 1000 units bolus IV x 1 Increase to heparin IV infusion at 1100 units/hr Heparin level 8 hours after starting Daily heparin level and CBC Follow up further imaging today.     Gretta Arab PharmD, BCPS Clinical Pharmacist [facility] main pharmacy [phone] 11/26/2021 7:39 AM

## 2021-11-26 NOTE — Progress Notes (Signed)
  Echocardiogram 2D Echocardiogram has been performed.  Bobbye Charleston 11/26/2021, 1:23 PM

## 2021-11-26 NOTE — Progress Notes (Addendum)
[facility] Progress Note Patient Name: Ronnie Snyder DOB: [birth year] MRN: [id number]   Date of Service  11/26/2021  HPI/Events of Note  Notified of rising troponin from 89 --> 317.   Lactate is improving from 7.9 --> 2.6.  eICU Interventions  Pt already on heparin gtt.  Start on ASA, statin.  Check lipid panel.  Pt listed nausea to atorvastatin.  Trial of rosuvastatin.      Intervention Category Intermediate Interventions: Diagnostic test evaluation  Elsie Lincoln 11/26/2021, 12:52 AM

## 2021-11-27 DIAGNOSIS — I739 Peripheral vascular disease, unspecified: Secondary | ICD-10-CM | POA: Diagnosis not present

## 2021-11-27 DIAGNOSIS — J9601 Acute respiratory failure with hypoxia: Secondary | ICD-10-CM | POA: Diagnosis not present

## 2021-11-27 DIAGNOSIS — J9602 Acute respiratory failure with hypercapnia: Secondary | ICD-10-CM | POA: Diagnosis not present

## 2021-11-27 DIAGNOSIS — I350 Nonrheumatic aortic (valve) stenosis: Secondary | ICD-10-CM | POA: Diagnosis not present

## 2021-11-27 LAB — CBC
HCT: 31.7 % — ABNORMAL LOW (ref 39.0–52.0)
Hemoglobin: 10.2 g/dL — ABNORMAL LOW (ref 13.0–17.0)
MCH: 34.1 pg — ABNORMAL HIGH (ref 26.0–34.0)
MCHC: 32.2 g/dL (ref 30.0–36.0)
MCV: 106 fL — ABNORMAL HIGH (ref 80.0–100.0)
Platelets: 232 10*3/uL (ref 150–400)
RBC: 2.99 MIL/uL — ABNORMAL LOW (ref 4.22–5.81)
RDW: 14.8 % (ref 11.5–15.5)
WBC: 15.6 10*3/uL — ABNORMAL HIGH (ref 4.0–10.5)
nRBC: 0 % (ref 0.0–0.2)

## 2021-11-27 LAB — URINALYSIS, ROUTINE W REFLEX MICROSCOPIC
Bacteria, UA: NONE SEEN
Bilirubin Urine: NEGATIVE
Glucose, UA: NEGATIVE mg/dL
Ketones, ur: NEGATIVE mg/dL
Leukocytes,Ua: NEGATIVE
Nitrite: NEGATIVE
Protein, ur: 30 mg/dL — AB
RBC / HPF: >50 RBC/hpf — ABNORMAL HIGH (ref 0–5)
Specific Gravity, Urine: 1.019 (ref 1.005–1.030)
pH: 5 (ref 5.0–8.0)

## 2021-11-27 LAB — CULTURE, BLOOD (ROUTINE X 2): Special Requests: ADEQUATE

## 2021-11-27 LAB — BASIC METABOLIC PANEL
Anion gap: 9 (ref 5–15)
BUN: 25 mg/dL — ABNORMAL HIGH (ref 8–23)
CO2: 34 mmol/L — ABNORMAL HIGH (ref 22–32)
Calcium: 8.6 mg/dL — ABNORMAL LOW (ref 8.9–10.3)
Chloride: 97 mmol/L — ABNORMAL LOW (ref 98–111)
Creatinine, Ser: 0.56 mg/dL — ABNORMAL LOW (ref 0.61–1.24)
GFR, Estimated: >60 mL/min (ref >60)
Glucose, Bld: 118 mg/dL — ABNORMAL HIGH (ref 70–99)
Potassium: 3.2 mmol/L — ABNORMAL LOW (ref 3.5–5.1)
Sodium: 140 mmol/L (ref 135–145)

## 2021-11-27 LAB — LEGIONELLA PNEUMOPHILA SEROGP 1 UR AG: L. pneumophila Serogp 1 Ur Ag: NEGATIVE

## 2021-11-27 LAB — MAGNESIUM: Magnesium: 1.5 mg/dL — ABNORMAL LOW (ref 1.7–2.4)

## 2021-11-27 LAB — BRAIN NATRIURETIC PEPTIDE: B Natriuretic Peptide: 433.1 pg/mL — ABNORMAL HIGH (ref 0.0–100.0)

## 2021-11-27 LAB — PHOSPHORUS: Phosphorus: 2.8 mg/dL (ref 2.5–4.6)

## 2021-11-27 MED ORDER — IPRATROPIUM-ALBUTEROL 0.5-2.5 (3) MG/3ML IN SOLN
3.0000 mL | Freq: Three times a day (TID) | RESPIRATORY_TRACT | Status: DC
  Administered 2021-11-28 – 2021-11-29 (×5): 3 mL via RESPIRATORY_TRACT
  Filled 2021-11-27 (×6): qty 3

## 2021-11-27 MED ORDER — POTASSIUM CHLORIDE 10 MEQ/100ML IV SOLN
10.0000 meq | INTRAVENOUS | Status: AC
  Administered 2021-11-27 (×4): 10 meq via INTRAVENOUS
  Filled 2021-11-27 (×4): qty 100

## 2021-11-27 MED ORDER — FUROSEMIDE 10 MG/ML IJ SOLN
40.0000 mg | Freq: Once | INTRAMUSCULAR | Status: AC
  Administered 2021-11-27: 40 mg via INTRAVENOUS
  Filled 2021-11-27: qty 4

## 2021-11-27 MED ORDER — POTASSIUM CHLORIDE CRYS ER 20 MEQ PO TBCR
40.0000 meq | EXTENDED_RELEASE_TABLET | Freq: Once | ORAL | Status: AC
  Administered 2021-11-27: 40 meq via ORAL
  Filled 2021-11-27: qty 2

## 2021-11-27 MED ORDER — MAGNESIUM SULFATE 4 GM/100ML IV SOLN
4.0000 g | Freq: Once | INTRAVENOUS | Status: AC
  Administered 2021-11-27: 4 g via INTRAVENOUS
  Filled 2021-11-27: qty 100

## 2021-11-27 MED ORDER — ORAL CARE MOUTH RINSE: 15.0000 mL | OROMUCOSAL | Status: DC | PRN

## 2021-11-27 MED ORDER — FENTANYL CITRATE PF 50 MCG/ML IJ SOSY
0.0000 ug | PREFILLED_SYRINGE | INTRAMUSCULAR | Status: DC | PRN
  Administered 2021-11-30: 25 ug via INTRAVENOUS
  Filled 2021-11-27: qty 1

## 2021-11-27 MED ORDER — POTASSIUM CHLORIDE CRYS ER 20 MEQ PO TBCR
20.0000 meq | EXTENDED_RELEASE_TABLET | ORAL | Status: AC
  Administered 2021-11-27 (×2): 20 meq via ORAL
  Filled 2021-11-27 (×2): qty 1

## 2021-11-27 MED ORDER — BETHANECHOL CHLORIDE 10 MG PO TABS
5.0000 mg | ORAL_TABLET | Freq: Three times a day (TID) | ORAL | Status: DC
Start: 2021-11-27 — End: 2021-12-03
  Administered 2021-11-27 – 2021-12-03 (×18): 5 mg via ORAL
  Filled 2021-11-27 (×2): qty 0.5
  Filled 2021-11-27: qty 1
  Filled 2021-11-27 (×2): qty 0.5
  Filled 2021-11-27: qty 1
  Filled 2021-11-27 (×4): qty 0.5
  Filled 2021-11-27: qty 1
  Filled 2021-11-27 (×11): qty 0.5

## 2021-11-27 NOTE — Progress Notes (Signed)
Pt seen during rounds, found off bipap on 12L [place]/hfnc.   HR90, RR25, spo2 95%.  Pt awake, alert, watching tv, no increased wob/respiratory distress noted or voiced by pt at this time.  Pt does not want to go back on bipap at this time.  Machine remains in room on standby.

## 2021-11-27 NOTE — Progress Notes (Signed)
Pt placed back on bipap for the night.

## 2021-11-27 NOTE — Progress Notes (Signed)
[facility] [place] ADULT ICU REPLACEMENT PROTOCOL   The patient does apply for the [facility] Adult ICU Electrolyte Replacment Protocol based on the criteria listed below:   1.Exclusion criteria: TCTS patients, ECMO patients, and Dialysis patients 2. Is GFR >/= 30 ml/min? Yes.    Patient's GFR today is >60 3. Is SCr </= 2? Yes.   Patient's SCr is 0.56 mg/dL 4. Did SCr increase >/= 0.5 in 24 hours? No. 5.Pt's weight >40kg  Yes.   6. Abnormal electrolyte(s): K 3.2, Mg 1.5  7. Electrolytes replaced per protocol   Christeen Douglas 11/27/2021 2:47 AM

## 2021-11-27 NOTE — Progress Notes (Addendum)
NAME:  Ronnie Snyder, MRN:  [id number], DOB:  September 03, 1940, LOS: 2 ADMISSION DATE:  11/25/2021, CONSULTATION DATE:  7/9 REFERRING MD:  Ronnie Snyder, CHIEF COMPLAINT: Hypoxia  History of Present Illness:  [name] is an 81 year old gentleman who presented to the ED after a fall today.  He does not remember falling and reports suddenly losing consciousness.  He has been feeling poorly for several days.  He denies fever, chills, sweats, productive cough, chest pain.  No history of blood clots or heart disease.  No previously known diagnosis of pulmonary disease, but his wife has COPD.  He continues to smoke cigarettes occasionally, but has overall cut back from how much he used to smoke.  He is not very active and ambulates with a cane.  He would not be able to walk a block outside.  In the ED he was found to be profoundly hypoxic with saturations in the 30s and gray skin upon presentation that improved quickly with administration of oxygen via nonrebreather.  He did not have an ABG done to confirm hypoxia.  VBG demonstrated hypercapnia and he was started on BiPAP.  He endorses discomfort with BiPAP and has right shoulder pain from his fall.  CT scan demonstrated proximal humerus fracture.  PCCM was consulted for respiratory failure.  Pertinent  Medical History  AAA, GERD, hypertension, hyperlipidemia, hypothyroidism, PAD, stroke.  Significant Hospital Events: Including procedures, antibiotic start and stop dates in addition to other pertinent events   Admitted 11/25/2021  Interim History / Subjective:  Wore biPAP part of the night then came off. Tolerated fine.  Currently on 12L O2 however. Fully awake, asking for breakfast. Had hematuria overnight with in and out cath after urinary retention of > 1000cc. Heparin gtt held.  Echo with EF 50-55%, G2DD, severe AS. Cards consult pending.  Objective   Blood pressure 120/88, pulse 82, temperature 97.7 F (36.5 C), temperature source Oral, resp.  rate 20, height '5\' 7"'$  (1.702 m), weight 47 kg, SpO2 98 %.    FiO2 (%):  [45 %] 45 %   Intake/Output Summary (Last 24 hours) at 11/27/2021 0737 Last data filed at 11/27/2021 0600 Gross per 24 hour  Intake 824.54 ml  Output 1800 ml  Net -975.46 ml    Filed Weights   11/25/21 2110 11/25/21 2215 11/27/21 0500  Weight: 49.9 kg 47.4 kg 47 kg    Examination: General: Elderly male, resting in bed watching TV, in NAD. Neuro: A&O x 3, no deficits. HEENT: [place]/AT. Sclerae anicteric. EOMI. Cardiovascular: RRR, no M/R/G.  Lungs: Respirations even and unlabored.  CTA bilaterally, No W/R/R. Abdomen: BS x 4, soft, NT/ND.  Musculoskeletal: R arm in sling, decreased ROM at shoulder but suprisingly not in much pain. No gross deformities, no edema.  Skin: Intact, warm, no rashes.  Assessment & Plan:   Acute respiratory failure with hypoxia and hypercapnia, we have a potential right lower lobe community-acquired pneumonia and acute COPD exacerbation.  Bilateral pleural effusions presumed 2/2 undiagnosed heart disease (see below). CTA neg for PE. - BiPAP as needed - Supplemental O2 for sats < 90 while off BiPAP - Needs ambulatory desaturation study prior to d/c. Likely will need at least short course of home O2 as he recovers - Continue bronchodilators.  Will try to hold off on steroids as much as possible given acute fracture.  If hypercapnia is more persistent, steroids likely will be required. - Empiric antibiotics-ceftriaxone and azithromycin - F/u on Legionella antigen (pneumococcal neg) - Respiratory culture  if able to collect - Lasix as below - Pulmonary hygiene - Mobilize, will ask PT to see  Syncope - unclear etiology. ? Opiate related (on Norco/Vicodin daily) - Supportive care. - Hold sedating meds.  ? NSTEMI vs demand ischemia from hypoxia - was on heparin gtt but stopped overnight 7/10 due to frank hematuria.  EKG without RWMA. - Cards consulted, appreciate the assistance (will see  today). - Statin, ASA  Newly diagnosed combined heart failure with severe AS (Echo 7/10 with EF 50-55%, G2DD). - Lasix 40 mg x 1 today. - Further recs per cards.  Displaced right humeral neck fracture status post fall Possible superior endplate deformity of L5 vertebral body, age-indeterminate - Ortho consulted, will follow along as might require surgical intervention at later date after acute issues. 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 Hematuria noted on in/out cath (heparin held). - Continue Flomax. - Add Urecholine. - Heparin on hold for now.  Stable for transfer to progressive. Will ask TRH to assume care I AM 7/12 with PCCM off.   Best Practice (right click and "Reselect all SmartList Selections" daily)   Diet/type: Regular consistency (see orders) DVT prophylaxis: systemic heparin GI prophylaxis: N/A Lines: N/A Foley:  N/A Code Status:  full code Last date of multidisciplinary goals of care discussion '[ ]'$     Montey Hora, [place] [facility] For pager details, please see AMION or use Epic chat  After 1900, please call [place] for cross coverage needs 11/27/2021, 7:37 AM

## 2021-11-27 NOTE — Progress Notes (Signed)
eLink Physician-Brief Progress Note Patient Name: Ronnie Snyder DOB: 1940-06-16 MRN: [id number]   Date of Service  11/27/2021  HPI/Events of Note  Patient with > 1000 cc urine retained in the bladder.  eICU Interventions  In / Out Foley catheterization order entered.        Kerry Kass Donnavin Vandenbrink 11/27/2021, 2:35 AM

## 2021-11-27 NOTE — Consult Note (Signed)
Ronnie Snyder  MRN: [id number] DOB/Age: [birth year] 81 y.o. [facility]       Subjective: Patient complaints of right shoulder pain as expected.  Currently receiving breathing treatment.  Vital Signs Temp:  [97.6 F (36.4 C)-98.8 F (37.1 C)] 97.8 F (36.6 C) (07/11 1200) Pulse Rate:  [72-105] 92 (07/11 1400) Resp:  [14-27] 21 (07/11 1400) BP: (117-179)/(62-131) 179/86 (07/11 1400) SpO2:  [85 %-100 %] 96 % (07/11 1628) FiO2 (%):  [45 %] 45 % (07/10 2303) Weight:  [47 kg] 47 kg (07/11 0500)  Lab Results Recent Labs    11/26/21 0303 11/26/21 2208 11/27/21 0157  WBC 20.9*  --  15.6*  HGB 11.3* 10.5* 10.2*  HCT 34.9* 32.6* 31.7*  PLT 240  --  232   BMET Recent Labs    11/26/21 0802 11/27/21 0157  NA 140 140  K 3.3* 3.2*  CL 96* 97*  CO2 32 34*  GLUCOSE 120* 118*  BUN 26* 25*  CREATININE 0.47* 0.56*  CALCIUM 8.9 8.6*   INR  Date Value Ref Range Status  11/25/2021 1.3 (H) 0.8 - 1.2 Final    Comment:    (NOTE) INR goal varies based on device and disease states. Performed at [facility], [place] [address] [place], [place] [address]      Exam Physical exam of his right shoulder shows some moderate swelling clinical asymmetry from the opposite shoulder.  He is grossly neurovascular intact distally.  Moderate swelling is noted about the shoulder drifting distally.  A sling has been applied but was not properly positioned, he is holding his right upper extremity in a position of comfort.   Radiographs  We went back and reviewed his radiographs we also reviewed the CT of his chest which included some images of the shoulder.  He is certainly noted to have a angulated and moderately displaced proximal humerus fracture.      Plan Right displaced and moderately angulated proximal humerus fracture  I have had a lengthy discussion with Mr. Formisano regarding treatment options.  While his fracture alignment is not ideal and will  most likely leave him with some significant residual stiffness about the shoulder he certainly has multiple increased risk factors as noted in all of his medical work-up.  We have discussed and he also understands and wishes to proceed with conservative management.  We have discussed appropriate positioning and sling use.  He may certainly come out of his sling to allow his arm to dangle.  He may use his elbow wrist and hand for light feeding and day-to-day activities.  Would have him avoid attempts at shoulder motion.  We would repeat x-rays in 4 to 6 weeks and depending on the radiographs consider discontinuing his sling use at that time and progressive motion and ad lib. activities.  While people can be left with some deformity and stiffness about the shoulder people can do quite well with malunions and given his overall medical status this would certainly be the preferred treatment.  All questions were encouraged and answered with the patient.  We will otherwise see him back in our office as an outpatient in 4 to 6 weeks.  Please call for any additional questions or concerns.  Olivia Mackie Jeslynn Hollander PA-C  11/27/2021, 5:11 PM Contact # [phone]

## 2021-11-28 ENCOUNTER — Inpatient Hospital Stay (HOSPITAL_COMMUNITY): Payer: Medicare Other

## 2021-11-28 DIAGNOSIS — I739 Peripheral vascular disease, unspecified: Secondary | ICD-10-CM | POA: Diagnosis not present

## 2021-11-28 DIAGNOSIS — I350 Nonrheumatic aortic (valve) stenosis: Secondary | ICD-10-CM | POA: Diagnosis not present

## 2021-11-28 DIAGNOSIS — I714 Abdominal aortic aneurysm, without rupture, unspecified: Secondary | ICD-10-CM | POA: Diagnosis not present

## 2021-11-28 DIAGNOSIS — J9601 Acute respiratory failure with hypoxia: Secondary | ICD-10-CM | POA: Diagnosis not present

## 2021-11-28 DIAGNOSIS — J9602 Acute respiratory failure with hypercapnia: Secondary | ICD-10-CM | POA: Diagnosis not present

## 2021-11-28 LAB — BASIC METABOLIC PANEL
Anion gap: 8 (ref 5–15)
BUN: 20 mg/dL (ref 8–23)
CO2: 34 mmol/L — ABNORMAL HIGH (ref 22–32)
Calcium: 8.5 mg/dL — ABNORMAL LOW (ref 8.9–10.3)
Chloride: 94 mmol/L — ABNORMAL LOW (ref 98–111)
Creatinine, Ser: <0.3 mg/dL — ABNORMAL LOW (ref 0.61–1.24)
Glucose, Bld: 95 mg/dL (ref 70–99)
Potassium: 4.6 mmol/L (ref 3.5–5.1)
Sodium: 136 mmol/L (ref 135–145)

## 2021-11-28 LAB — CBC
HCT: 32.7 % — ABNORMAL LOW (ref 39.0–52.0)
Hemoglobin: 10.3 g/dL — ABNORMAL LOW (ref 13.0–17.0)
MCH: 33.6 pg (ref 26.0–34.0)
MCHC: 31.5 g/dL (ref 30.0–36.0)
MCV: 106.5 fL — ABNORMAL HIGH (ref 80.0–100.0)
Platelets: 201 10*3/uL (ref 150–400)
RBC: 3.07 MIL/uL — ABNORMAL LOW (ref 4.22–5.81)
RDW: 14.9 % (ref 11.5–15.5)
WBC: 12.5 10*3/uL — ABNORMAL HIGH (ref 4.0–10.5)
nRBC: 0 % (ref 0.0–0.2)

## 2021-11-28 LAB — MAGNESIUM: Magnesium: 1.8 mg/dL (ref 1.7–2.4)

## 2021-11-28 LAB — PHOSPHORUS: Phosphorus: 3.1 mg/dL (ref 2.5–4.6)

## 2021-11-28 MED ORDER — LIDOCAINE 5 % EX PTCH
1.0000 | MEDICATED_PATCH | CUTANEOUS | Status: DC
  Administered 2021-11-28 – 2021-12-02 (×5): 1 via TRANSDERMAL
  Filled 2021-11-28 (×5): qty 1

## 2021-11-28 MED ORDER — OXYCODONE HCL 5 MG PO TABS
5.0000 mg | ORAL_TABLET | ORAL | Status: DC | PRN
  Administered 2021-11-28 – 2021-11-30 (×5): 5 mg via ORAL
  Filled 2021-11-28 (×6): qty 1

## 2021-11-28 NOTE — Care Management Important Message (Signed)
Important Message  Patient Details IM Letter given to the Patient. Name: Ronnie Snyder MRN: [id number] Date of Birth: 27-Jan-1941   Medicare Important Message Given:  Yes     Kerin Salen 11/28/2021, 12:46 PM

## 2021-11-28 NOTE — Progress Notes (Signed)
PROGRESS NOTE    Ronnie Snyder  WUJ:[id number] DOB: 1940-05-26 DOA: 11/25/2021 PCP: Luetta Nutting, DO   Brief Narrative: PCCM pickup 11/28/2021, he was admitted 11/25/2021.  81 year old male admitted 2 days ago after a fall at home.  It appears that he had a syncopal event of unclear etiology.  He was found to be hypercapnic and was placed on BiPAP.  Patient suffered a right proximal humerus fracture with a fall. His past medical history significant for hypertension hypothyroidism hyperlipidemia AAA GERD PAD and stroke. Patient had acute urinary retention Foley catheter placed Echo EF 50 to 95% grade 2 diastolic dysfunction and severe aortic stenosis. Urine strep pneumo and Legionella negative  Assessment & Plan:   Principal Problem:   Acute respiratory failure with hypoxia and hypercapnia (HCC) Active Problems:   AAA (abdominal aortic aneurysm) (HCC)   PAD (peripheral artery disease) (HCC)   Pressure injury of skin   Aortic stenosis  #1 acute hypoxic hypercapnic respiratory failure due to community-acquired pneumonia and COPD exacerbation treated with BiPAP now on nasal cannula CT angiogram negative for pulmonary embolism. Continue BiPAP as needed. Check ambulatory oxygen saturation.  He did not have oxygen prior to admission to hospital. He continues to smoke some. He is being treated with Rocephin and azithromycin and Lasix he had bilateral pleural effusions.  #2 severe aortic stenosis with grade 2 diastolic dysfunction and EF of 50 to 55% seen by cardiology recommending right and left heart catheterization on Friday. Elevated troponin-peaked at 471. BNP 433  #3 displaced right humeral neck fracture status post fall Ortho was consulted Continue right upper extremity sling No immediate plans for surgery noted  #4 acute urinary retention placed Foley catheter continue Flomax Urecholine added for spasm Holding heparin as patient had hematuria  #5 hypothyroidism on  Synthroid  #6 hypertension pressure stable 128/65  #7 AAA history of peripheral artery disease on aspirin and statin   Pressure Injury 11/25/21 Sacrum Medial Stage 1 -  Intact skin with non-blanchable redness of a localized area usually over a bony prominence. (Active)  11/25/21 2230  Location: Sacrum  Location Orientation: Medial  Staging: Stage 1 -  Intact skin with non-blanchable redness of a localized area usually over a bony prominence.  Wound Description (Comments):   Present on Admission: Yes  Dressing Type Foam - Lift dressing to assess site every shift 11/27/21 0800     Estimated body mass index is 16.4 kg/m as calculated from the following:   Height as of this encounter: '5\' 7"'$  (1.702 m).   Weight as of this encounter: 47.5 kg.  DVT prophylaxis: SCD Code Status: Full code Family Communication: None at bedside Disposition Plan:  Status is: Inpatient Remains inpatient appropriate because: IV antibiotics COPD exacerbation pneumonia   Consultants:  PCCM and cardiology and Ortho  Procedures: Foley catheter placed 11/27/2021 Antimicrobials: Rocephin azithromycin  Subjective:  Feels better  Still on 5 L of O2 saturating 96% Foley in placeFoley in place   Objective: Vitals:   11/28/21 0800 11/28/21 1001 11/28/21 1250 11/28/21 1344  BP:  135/72 128/65   Pulse:  81 73   Resp:  18 18   Temp:  97.7 F (36.5 C) 97.7 F (36.5 C)   TempSrc:  Oral Oral   SpO2: 97% 96% 93% 100%  Weight:      Height:        Intake/Output Summary (Last 24 hours) at 11/28/2021 1437 Last data filed at 11/28/2021 1254 Gross per 24 hour  Intake  890 ml  Output 1612 ml  Net -722 ml   Filed Weights   11/25/21 2215 11/27/21 0500 11/28/21 0500  Weight: 47.4 kg 47 kg 47.5 kg    Examination:  General exam: Appears calm and comfortable  Respiratory system: Diminished to auscultation. Respiratory effort normal. Cardiovascular system: S1 & S2 heard, RRR. No JVD, murmurs, rubs, gallops or  clicks. No pedal edema. Gastrointestinal system: Abdomen is nondistended, soft and nontender. No organomegaly or masses felt. Normal bowel sounds heard. Central nervous system: Alert and oriented. No focal neurological deficits. Extremities: 1+ pitting edema Skin: No rashes, lesions or ulcers Psychiatry: Judgement and insight appear normal. Mood & affect appropriate.     Data Reviewed: I have personally reviewed following labs and imaging studies  CBC: Recent Labs  Lab 11/25/21 1740 11/25/21 1742 11/26/21 0303 11/26/21 2208 11/27/21 0157 11/28/21 0425  WBC 17.6*  --  20.9*  --  15.6* 12.5*  NEUTROABS 15.8*  --   --   --   --   --   HGB 11.0* 11.6* 11.3* 10.5* 10.2* 10.3*  HCT 34.7* 34.0* 34.9* 32.6* 31.7* 32.7*  MCV 107.1*  --  105.4*  --  106.0* 106.5*  PLT 242  --  240  --  232 976   Basic Metabolic Panel: Recent Labs  Lab 11/25/21 1740 11/25/21 1742 11/26/21 0802 11/27/21 0157 11/28/21 0425  NA 139 134* 140 140 136  K 4.7 4.7 3.3* 3.2* 4.6  CL 97* 93* 96* 97* 94*  CO2 32  --  32 34* 34*  GLUCOSE 181* 180* 120* 118* 95  BUN 32* 37* 26* 25* 20  CREATININE 0.62 0.60* 0.47* 0.56* <0.30*  CALCIUM 9.0  --  8.9 8.6* 8.5*  MG  --   --   --  1.5* 1.8  PHOS  --   --   --  2.8 3.1   GFR: CrCl cannot be calculated (This lab value cannot be used to calculate CrCl because it is not a number: <0.30). Liver Function Tests: Recent Labs  Lab 11/25/21 1740  AST 118*  ALT 100*  ALKPHOS 87  BILITOT 1.0  PROT 6.4*  ALBUMIN 3.3*   No results for input(s): "LIPASE", "AMYLASE" in the last 168 hours. No results for input(s): "AMMONIA" in the last 168 hours. Coagulation Profile: Recent Labs  Lab 11/25/21 2313  INR 1.3*   Cardiac Enzymes: No results for input(s): "CKTOTAL", "CKMB", "CKMBINDEX", "TROPONINI" in the last 168 hours. BNP (last 3 results) No results for input(s): "PROBNP" in the last 8760 hours. HbA1C: No results for input(s): "HGBA1C" in the last 72  hours. CBG: No results for input(s): "GLUCAP" in the last 168 hours. Lipid Profile: Recent Labs    11/26/21 0303  CHOL 102  HDL 39*  LDLCALC 52  TRIG 53  CHOLHDL 2.6   Thyroid Function Tests: Recent Labs    11/25/21 1925  TSH 1.394   Anemia Panel: No results for input(s): "VITAMINB12", "FOLATE", "FERRITIN", "TIBC", "IRON", "RETICCTPCT" in the last 72 hours. Sepsis Labs: Recent Labs  Lab 11/25/21 2027 11/25/21 2313 11/26/21 0303  LATICACIDVEN 7.9* 2.6* 1.1    Recent Results (from the past 240 hour(s))  Blood culture (routine x 2)     Status: Abnormal   Collection Time: 11/25/21  6:09 PM   Specimen: BLOOD  Result Value Ref Range Status   Specimen Description   Final    BLOOD RIGHT ANTECUBITAL Performed at [place] [address] [place],  [place] [address]    Special Requests   Final    BOTTLES DRAWN AEROBIC AND ANAEROBIC Blood Culture adequate volume Performed at [place] [address], [place], [place] [address]    Culture  Setup Time   Final    GRAM POSITIVE COCCI IN CLUSTERS AEROBIC BOTTLE ONLY CRITICAL RESULT CALLED TO, READ BACK BY AND VERIFIED WITH: PHARMD JIGNA GADHIA ON 11/26/21 @ 1717 BY DRT    Culture (A)  Final    STAPHYLOCOCCUS EPIDERMIDIS THE SIGNIFICANCE OF ISOLATING THIS ORGANISM FROM A SINGLE SET OF BLOOD CULTURES WHEN MULTIPLE SETS ARE DRAWN IS UNCERTAIN. PLEASE NOTIFY THE MICROBIOLOGY DEPARTMENT WITHIN ONE WEEK IF SPECIATION AND SENSITIVITIES ARE REQUIRED. Performed at [facility], [place] [address] [place], [place] [address]    Report Status 11/27/2021 FINAL  Final  Blood Culture ID Panel (Reflexed)     Status: Abnormal   Collection Time: 11/25/21  6:09 PM  Result Value Ref Range Status   Enterococcus faecalis NOT DETECTED NOT DETECTED Final   Enterococcus Faecium NOT DETECTED NOT DETECTED Final   Listeria monocytogenes NOT DETECTED NOT DETECTED Final   Staphylococcus species DETECTED  (A) NOT DETECTED Final    Comment: CRITICAL RESULT CALLED TO, READ BACK BY AND VERIFIED WITH: PHARMD JIGNA GADHIA ON 11/26/21 @ 1717 BY DRT    Staphylococcus aureus (BCID) NOT DETECTED NOT DETECTED Final   Staphylococcus epidermidis DETECTED (A) NOT DETECTED Final    Comment: CRITICAL RESULT CALLED TO, READ BACK BY AND VERIFIED WITH: PHARMD JIGNA GADHIA ON 11/26/21 @ 1717 BY DRT    Staphylococcus lugdunensis NOT DETECTED NOT DETECTED Final   Streptococcus species NOT DETECTED NOT DETECTED Final   Streptococcus agalactiae NOT DETECTED NOT DETECTED Final   Streptococcus pneumoniae NOT DETECTED NOT DETECTED Final   Streptococcus pyogenes NOT DETECTED NOT DETECTED Final   A.calcoaceticus-baumannii NOT DETECTED NOT DETECTED Final   Bacteroides fragilis NOT DETECTED NOT DETECTED Final   Enterobacterales NOT DETECTED NOT DETECTED Final   Enterobacter cloacae complex NOT DETECTED NOT DETECTED Final   Escherichia coli NOT DETECTED NOT DETECTED Final   Klebsiella aerogenes NOT DETECTED NOT DETECTED Final   Klebsiella oxytoca NOT DETECTED NOT DETECTED Final   Klebsiella pneumoniae NOT DETECTED NOT DETECTED Final   Proteus species NOT DETECTED NOT DETECTED Final   Salmonella species NOT DETECTED NOT DETECTED Final   Serratia marcescens NOT DETECTED NOT DETECTED Final   Haemophilus influenzae NOT DETECTED NOT DETECTED Final   Neisseria meningitidis NOT DETECTED NOT DETECTED Final   Pseudomonas aeruginosa NOT DETECTED NOT DETECTED Final   Stenotrophomonas maltophilia NOT DETECTED NOT DETECTED Final   Candida albicans NOT DETECTED NOT DETECTED Final   Candida auris NOT DETECTED NOT DETECTED Final   Candida glabrata NOT DETECTED NOT DETECTED Final   Candida krusei NOT DETECTED NOT DETECTED Final   Candida parapsilosis NOT DETECTED NOT DETECTED Final   Candida tropicalis NOT DETECTED NOT DETECTED Final   Cryptococcus neoformans/gattii NOT DETECTED NOT DETECTED Final   Methicillin resistance mecA/C  NOT DETECTED NOT DETECTED Final    Comment: Performed at [facility], [place]. [address], [place], [place] [address]  Blood culture (routine x 2)     Status: None (Preliminary result)   Collection Time: 11/25/21  6:14 PM   Specimen: BLOOD  Result Value Ref Range Status   Specimen Description   Final    BLOOD LEFT ANTECUBITAL Performed at [place] [address] [place], [place]  [address]    Special Requests   Final    BOTTLES DRAWN AEROBIC AND ANAEROBIC Blood Culture results may not be optimal due to an inadequate volume of blood received in culture bottles Performed at [facility], [place] [address] [place], [place] [address]    Culture   Final    NO GROWTH 2 DAYS Performed at [place] [address] [place], [place] [address]    Report Status PENDING  Incomplete  SARS Coronavirus 2 by RT PCR (hospital order, performed in [facility] hospital lab) *cepheid single result test* Anterior Nasal Swab     Status: None   Collection Time: 11/25/21  7:53 PM   Specimen: Anterior Nasal Swab  Result Value Ref Range Status   SARS Coronavirus 2 by RT PCR NEGATIVE NEGATIVE Final    Comment: (NOTE) SARS-CoV-2 target nucleic acids are NOT DETECTED.  The SARS-CoV-2 RNA is generally detectable in upper and lower respiratory specimens during the acute phase of infection. The lowest concentration of SARS-CoV-2 viral copies this assay can detect is 250 copies / mL. A negative result does not preclude SARS-CoV-2 infection and should not be used as the sole basis for treatment or other patient management decisions.  A negative result may occur with improper specimen collection / handling, submission of specimen other than nasopharyngeal swab, presence of viral mutation(s) within the areas targeted by this assay, and inadequate number of viral copies (<250 copies / mL). A negative result must be combined with clinical observations, patient  history, and epidemiological information.  Fact Sheet for Patients:   https://www.patel.info/  Fact Sheet for Healthcare Providers: https://hall.com/  This test is not yet approved or  cleared by the [place] FDA and has been authorized for detection and/or diagnosis of SARS-CoV-2 by FDA under an Emergency Use Authorization (EUA).  This EUA will remain in effect (meaning this test can be used) for the duration of the COVID-19 declaration under Section 564(b)(1) of the Act, 21 U.S.C. section 360bbb-3(b)(1), unless the authorization is terminated or revoked sooner.  Performed at [facility], [place] [address] [place], [place] [address]   Respiratory (~20 pathogens) panel by PCR     Status: None   Collection Time: 11/25/21  7:54 PM   Specimen: Anterior Nasal Swab; Respiratory  Result Value Ref Range Status   Adenovirus NOT DETECTED NOT DETECTED Final   Coronavirus 229E NOT DETECTED NOT DETECTED Final    Comment: (NOTE) The Coronavirus on the Respiratory Panel, DOES NOT test for the novel  Coronavirus (2019 nCoV)    Coronavirus HKU1 NOT DETECTED NOT DETECTED Final   Coronavirus NL63 NOT DETECTED NOT DETECTED Final   Coronavirus OC43 NOT DETECTED NOT DETECTED Final   Metapneumovirus NOT DETECTED NOT DETECTED Final   Rhinovirus / Enterovirus NOT DETECTED NOT DETECTED Final   Influenza A NOT DETECTED NOT DETECTED Final   Influenza B NOT DETECTED NOT DETECTED Final   Parainfluenza Virus 1 NOT DETECTED NOT DETECTED Final   Parainfluenza Virus 2 NOT DETECTED NOT DETECTED Final   Parainfluenza Virus 3 NOT DETECTED NOT DETECTED Final   Parainfluenza Virus 4 NOT DETECTED NOT DETECTED Final   Respiratory Syncytial Virus NOT DETECTED NOT DETECTED Final   Bordetella pertussis NOT DETECTED NOT DETECTED Final   Bordetella Parapertussis NOT DETECTED NOT DETECTED Final   Chlamydophila pneumoniae NOT DETECTED NOT DETECTED Final    Mycoplasma pneumoniae NOT DETECTED NOT DETECTED Final    Comment: Performed at [facility], [place] [address], [place], [place] [address]  MRSA Next Gen by PCR, Nasal     Status: None   Collection Time: 11/25/21 10:14 PM   Specimen: Nasal Mucosa; Nasal Swab  Result Value Ref Range Status   MRSA by PCR Next Gen NOT DETECTED NOT DETECTED Final    Comment: (NOTE) The GeneXpert MRSA Assay (FDA approved for NASAL specimens only), is one component of a comprehensive MRSA colonization surveillance program. It is not intended to diagnose MRSA infection nor to guide or monitor treatment for MRSA infections. Test performance is not FDA approved in patients less than 35 years old. Performed at [facility], [place] [address], [place], [place] [address]          Radiology Studies: [facility] Chest Port 1 View  Result Date: 11/28/2021 CLINICAL DATA:  Respiratory failure. EXAM: PORTABLE CHEST 1 VIEW COMPARISON:  CTA chest 11/26/2021 FINDINGS: The patient is rotated to the right. The heart is slightly enlarged. The central vessels are normal caliber. The upper lobes are mildly emphysematous. Moderate right and small left pleural effusions and overlying atelectasis or consolidation continue to be noted in the lung bases with the mid and upper lung fields remaining clear. Overall aeration appears unchanged. The aorta is tortuous and calcified. Stable mediastinum. A moderately displaced comminuted proximal right humeral fracture is again noted. There is osteoporosis.  Mild levoscoliosis thoracic spine. IMPRESSION: 1. No essential change in right-greater-than-left pleural effusions and overlying basilar lung opacities. 2. Aortic atherosclerosis. 3. Displaced proximal right humeral fracture, with osteoporosis. Electronically Signed   By: Telford Nab M.D.   On: 11/28/2021 06:36        Scheduled Meds:  aspirin EC  81 mg Oral Daily   bethanechol  5 mg Oral TID   Chlorhexidine Gluconate  Cloth  6 each Topical Daily   folic acid  1 mg Oral Daily   ipratropium-albuterol  3 mL Nebulization TID   levothyroxine  75 mcg Oral Q0600   mouth rinse  15 mL Mouth Rinse 4 times per day   rosuvastatin  20 mg Oral Daily   tamsulosin  0.4 mg Oral Daily   Continuous Infusions:  sodium chloride Stopped (11/27/21 0746)   azithromycin (ZITHROMAX) 500 mg in sodium chloride 0.9 % 250 mL IVPB 500 mg (11/27/21 2200)   cefTRIAXone (ROCEPHIN)  IV 2 g (11/27/21 3546)     LOS: 3 days    Time spent: 35 minutes  Georgette Shell, MD 11/28/2021, 2:37 PM

## 2021-11-28 NOTE — TOC Progression Note (Signed)
Transition of Care [facility]) - Progression Note    Patient Details  Name: Ronnie Snyder MRN: [id number] Date of Birth: [birth year]  [facility] CM/SW Contact  Leeroy Cha, RN Phone Number: 11/28/2021, 8:36 AM  Clinical Narrative:    [phone] chart reviewed.  Following for toc needs.  Plan is to return home with self-care at this time.   Expected Discharge Plan: Home w Hospice Care Barriers to Discharge: Continued Medical Work up  Expected Discharge Plan and Services Expected Discharge Plan: [place]   Discharge Planning Services: CM Consult   Living arrangements for the past 2 months: Single Family Home                                       Social Determinants of Health (SDOH) Interventions    Readmission Risk Interventions     No data to display

## 2021-11-28 NOTE — Evaluation (Signed)
Physical Therapy Evaluation Patient Details Name: Ronnie Snyder MRN: [id number] DOB: 04/19/1941 Today's Date: 11/28/2021  History of Present Illness  81 year old gentleman who presented to the ED after a fall.  He does not remember falling and reports suddenly losing consciousness.  He has been feeling poorly for several days. In ED O2 saturations were in the 30s, pt placed on BiPAP. Imaging showed R proximal humerus fx, plan is for conservative management with a sling. PMH: HTN, anemia.  Clinical Impression  Pt admitted with above diagnosis. Max assist for supine to sit, mod assist for standing balance due to posterior lean. Mod assist to pivot to recliner. Pt denied dizziness in sitting and standing. Assessed orthostatics (see flowsheets), pt did not have a significant drop in systolic BP with position changes. SpO2 88% on 6L O2 with activity. ST-SNF recommended.  Pt currently with functional limitations due to the deficits listed below (see PT Problem List). Pt will benefit from skilled PT to increase their independence and safety with mobility to allow discharge to the venue listed below.          Recommendations for follow up therapy are one component of a multi-disciplinary discharge planning process, led by the attending physician.  Recommendations may be updated based on patient status, additional functional criteria and insurance authorization.  Follow Up Recommendations Skilled nursing-short term rehab (<3 hours/day) Can patient physically be transported by private vehicle: No    Assistance Recommended at Discharge Frequent or constant Supervision/Assistance  Patient can return home with the following  A lot of help with walking and/or transfers;A lot of help with bathing/dressing/bathroom;Assistance with cooking/housework;Assist for transportation;Help with stairs or ramp for entrance    Equipment Recommendations None recommended by PT  Recommendations for Other Services        Functional Status Assessment Patient has had a recent decline in their functional status and demonstrates the ability to make significant improvements in function in a reasonable and predictable amount of time.     Precautions / Restrictions Precautions Precautions: Fall Precaution Comments: fell PTA, pt cannot recall if he's had other falls in past 6 months Required Braces or Orthoses: Sling Restrictions Weight Bearing Restrictions: No RUE Weight Bearing: Non weight bearing Other Position/Activity Restrictions: no ROM to shoulder, may dangle, OK to do ROM to elbow/wrist/hand per ortho note      Mobility  Bed Mobility Overal bed mobility: Needs Assistance Bed Mobility: Supine to Sit     Supine to sit: Max assist     General bed mobility comments: assist to raise trunk and pivot hips to EOB    Transfers Overall transfer level: Needs assistance Equipment used: 1 person hand held assist Transfers: Sit to/from Stand, Bed to chair/wheelchair/BSC Sit to Stand: Mod assist   Step pivot transfers: Mod assist       General transfer comment: mod A to power up/steady, mod A for balance with SPT 2* Posterior lean    Ambulation/Gait                  Stairs            Wheelchair Mobility    Modified Rankin (Stroke Patients Only)       Balance Overall balance assessment: Needs assistance Sitting-balance support: Feet supported, Single extremity supported Sitting balance-Leahy Scale: Poor     Standing balance support: Single extremity supported Standing balance-Leahy Scale: Poor Standing balance comment: min to mod A for posterior lean  Pertinent Vitals/Pain Pain Assessment Pain Assessment: Faces Pain Score: 4  Pain Location: R shoulder with bed mobility/transfers    Home Living Family/patient expects to be discharged to:: Skilled nursing facility                   Additional Comments: lives with  wife, pt is CG for wife    Prior Function Prior Level of Function : Independent/Modified Independent             Mobility Comments: walked with a cane ADLs Comments: independent     Hand Dominance        Extremity/Trunk Assessment   Upper Extremity Assessment Upper Extremity Assessment: RUE deficits/detail RUE Deficits / Details: R shoulder in sling RUE: Unable to fully assess due to immobilization    Lower Extremity Assessment Lower Extremity Assessment: Overall WFL for tasks assessed    Cervical / Trunk Assessment Cervical / Trunk Assessment: Kyphotic  Communication   Communication: No difficulties  Cognition Arousal/Alertness: Awake/alert Behavior During Therapy: WFL for tasks assessed/performed Overall Cognitive Status: No family/caregiver present to determine baseline cognitive functioning                                 General Comments: can follow commands, does not recall events leading up to his fall        General Comments General comments (skin integrity, edema, etc.): assessed orthostatics (see flowsheets), pt denied dizziness throughout position changes, SpO2 88% on 6L O2 with activity    Exercises     Assessment/Plan    PT Assessment Patient needs continued PT services  PT Problem List Decreased strength;Decreased mobility;Decreased activity tolerance;Decreased balance;Pain;Cardiopulmonary status limiting activity       PT Treatment Interventions Gait training;Therapeutic exercise;Functional mobility training;Balance training;Therapeutic activities    PT Goals (Current goals can be found in the Care Plan section)  Acute Rehab PT Goals Patient Stated Goal: pt is agreeable to ST-SNF PT Goal Formulation: With patient Time For Goal Achievement: 12/12/21 Potential to Achieve Goals: Fair    Frequency Min 3X/week     Co-evaluation               AM-PAC PT "6 Clicks" Mobility  Outcome Measure Help needed turning from  your back to your side while in a flat bed without using bedrails?: A Lot Help needed moving from lying on your back to sitting on the side of a flat bed without using bedrails?: A Lot Help needed moving to and from a bed to a chair (including a wheelchair)?: A Lot Help needed standing up from a chair using your arms (e.g., wheelchair or bedside chair)?: A Lot Help needed to walk in hospital room?: Total Help needed climbing 3-5 steps with a railing? : Total 6 Click Score: 10    End of Session Equipment Utilized During Treatment: Oxygen;Gait belt Activity Tolerance: Patient limited by fatigue Patient left: in chair;with chair alarm set;with call bell/phone within reach Nurse Communication: Mobility status PT Visit Diagnosis: Difficulty in walking, not elsewhere classified (R26.2);Other abnormalities of gait and mobility (R26.89);History of falling (Z91.81);Pain Pain - Right/Left: Right Pain - part of body: Shoulder    Time: 8099-8338 PT Time Calculation (min) (ACUTE ONLY): 24 min   Charges:   PT Evaluation $PT Eval Moderate Complexity: 1 Mod PT Treatments $Therapeutic Activity: 8-22 mins       Blondell Reveal Kistler PT 11/28/2021  Acute Rehabilitation Services  Office  [phone]   

## 2021-11-28 NOTE — TOC Progression Note (Signed)
Transition of Care [facility]) - Progression Note    Patient Details  Name: FINDLAY DAGHER MRN: [id number] Date of Birth: Dec 25, 1940  [facility] CM/SW Contact  Leeroy Cha, RN Phone Number: 11/28/2021, 3:09 PM  Clinical Narrative:    Fl2 sent to the area snf via the hub for review.   Expected Discharge Plan: [place] Barriers to Discharge: Continued Medical Work up  Expected Discharge Plan and Services Expected Discharge Plan: [place]   Discharge Planning Services: CM Consult   Living arrangements for the past 2 months: Single Family Home                                       Social Determinants of Health (SDOH) Interventions    Readmission Risk Interventions     No data to display

## 2021-11-28 NOTE — Progress Notes (Signed)
DAILY PROGRESS NOTE   Patient Name: RONEN BROMWELL Date of Encounter: 11/28/2021 Cardiologist: None  Chief Complaint   No complaints  Patient Profile   NAHUM SHERRER is a 81 y.o. male with a history  of PAD s/p prior intervention of left lower extremity,  AAA, left common iliac artery aneurysm, left common femoral aneurysm, carotid artery disease, stroke, hypertension, hyperlipidemia, hypothyroidism, GERD, and hepatic steatosis who is being seen for the evaluation of severe aortic stenosis at the request of Dr. Ander Slade  Subjective   No issues overnight. Orthostatics were negative. Noted to have gross hematuria overnight - heparin was held.  BNP elevated at 443. Net negative 1.4L negative overall via IV lasix which has been stopped.  Objective   Vitals:   11/28/21 0500 11/28/21 0600 11/28/21 0800 11/28/21 1001  BP:  127/74  135/72  Pulse:  78  81  Resp:  16  18  Temp:  97.8 F (36.6 C)  97.7 F (36.5 C)  TempSrc:  Axillary  Oral  SpO2:  92% 97% 96%  Weight: 47.5 kg     Height:        Intake/Output Summary (Last 24 hours) at 11/28/2021 1101 Last data filed at 11/28/2021 1029 Gross per 24 hour  Intake 650 ml  Output 1612 ml  Net -962 ml   Filed Weights   11/25/21 2215 11/27/21 0500 11/28/21 0500  Weight: 47.4 kg 47 kg 47.5 kg    Physical Exam   General appearance: alert, cachectic, and no distress Neck: no carotid bruit, no JVD, and thyroid not enlarged, symmetric, no tenderness/mass/nodules Lungs: diminished breath sounds bibasilar Heart: regular rate and rhythm and systolic murmur: systolic ejection 3/6, crescendo at 2nd right intercostal space Abdomen: soft, non-tender; bowel sounds normal; no masses,  no organomegaly Extremities: extremities normal, atraumatic, no cyanosis or edema Pulses: 2+ and symmetric Skin: Skin color, texture, turgor normal. No rashes or lesions Neurologic: Grossly normal Psych: Pleasant  Inpatient Medications     Scheduled Meds:  aspirin EC  81 mg Oral Daily   bethanechol  5 mg Oral TID   Chlorhexidine Gluconate Cloth  6 each Topical Daily   folic acid  1 mg Oral Daily   ipratropium-albuterol  3 mL Nebulization TID   levothyroxine  75 mcg Oral Q0600   mouth rinse  15 mL Mouth Rinse 4 times per day   rosuvastatin  20 mg Oral Daily   tamsulosin  0.4 mg Oral Daily    Continuous Infusions:  sodium chloride Stopped (11/27/21 0746)   azithromycin (ZITHROMAX) 500 mg in sodium chloride 0.9 % 250 mL IVPB 500 mg (11/27/21 2200)   cefTRIAXone (ROCEPHIN)  IV 2 g (11/27/21 2058)    PRN Meds: sodium chloride, acetaminophen, docusate sodium, fentaNYL (SUBLIMAZE) injection, ipratropium-albuterol, labetalol, ondansetron (ZOFRAN) IV, mouth rinse, polyethylene glycol, traMADol   Labs   Results for orders placed or performed during the hospital encounter of 11/25/21 (from the past 48 hour(s))  Heparin level (unfractionated)     Status: None   Collection Time: 11/26/21  6:30 PM  Result Value Ref Range   Heparin Unfractionated 0.33 0.30 - 0.70 IU/mL    Comment: (NOTE) The clinical reportable range upper limit is being lowered to >1.10 to align with the FDA approved guidance for the current laboratory assay.  If heparin results are below expected values, and patient dosage has  been confirmed, suggest follow up testing of antithrombin III levels. Performed at [facility], [place]  [address], [place], [place] [address]   Hemoglobin and hematocrit, blood     Status: Abnormal   Collection Time: 11/26/21 10:08 PM  Result Value Ref Range   Hemoglobin 10.5 (L) 13.0 - 17.0 g/dL   HCT 32.6 (L) 39.0 - 52.0 %    Comment: Performed at [facility], [place] [address] [place], [place] [address]  Type and screen [place]     Status: None   Collection Time: 11/26/21 10:08 PM  Result Value Ref Range   ABO/RH(D) AB POS    Antibody Screen NEG    Sample  Expiration      11/29/2021,2359 Performed at [facility], [place] [address], [place], [place] [address]   CBC     Status: Abnormal   Collection Time: 11/27/21  1:57 AM  Result Value Ref Range   WBC 15.6 (H) 4.0 - 10.5 K/uL   RBC 2.99 (L) 4.22 - 5.81 MIL/uL   Hemoglobin 10.2 (L) 13.0 - 17.0 g/dL   HCT 31.7 (L) 39.0 - 52.0 %   MCV 106.0 (H) 80.0 - 100.0 fL   MCH 34.1 (H) 26.0 - 34.0 pg   MCHC 32.2 30.0 - 36.0 g/dL   RDW 14.8 11.5 - 15.5 %   Platelets 232 150 - 400 K/uL   nRBC 0.0 0.0 - 0.2 %    Comment: Performed at [facility], [place] [address], [place], [place] [address]  Basic metabolic panel     Status: Abnormal   Collection Time: 11/27/21  1:57 AM  Result Value Ref Range   Sodium 140 135 - 145 mmol/L   Potassium 3.2 (L) 3.5 - 5.1 mmol/L   Chloride 97 (L) 98 - 111 mmol/L   CO2 34 (H) 22 - 32 mmol/L   Glucose, Bld 118 (H) 70 - 99 mg/dL    Comment: Glucose reference range applies only to samples taken after fasting for at least 8 hours.   BUN 25 (H) 8 - 23 mg/dL   Creatinine, Ser 0.56 (L) 0.61 - 1.24 mg/dL   Calcium 8.6 (L) 8.9 - 10.3 mg/dL   GFR, Estimated >60 >60 mL/min    Comment: (NOTE) Calculated using the CKD-EPI Creatinine Equation (2021)    Anion gap 9 5 - 15    Comment: Performed at [facility], [place] [address], [place], [place] [address]  Phosphorus     Status: None   Collection Time: 11/27/21  1:57 AM  Result Value Ref Range   Phosphorus 2.8 2.5 - 4.6 mg/dL    Comment: Performed at [facility], [place] [address] [place], [place] [address]  Magnesium     Status: Abnormal   Collection Time: 11/27/21  1:57 AM  Result Value Ref Range   Magnesium 1.5 (L) 1.7 - 2.4 mg/dL    Comment: Performed at [facility], [place] [address], [place], [place] [address]  Brain natriuretic peptide     Status: Abnormal   Collection Time: 11/27/21  1:57 AM  Result Value Ref Range   B Natriuretic  Peptide 433.1 (H) 0.0 - 100.0 pg/mL    Comment: Performed at [facility], [place] [address], [place], [place] [address]  Urinalysis, Routine w reflex microscopic     Status: Abnormal   Collection Time: 11/27/21  2:51 AM  Result Value Ref Range   Color, Urine YELLOW YELLOW   APPearance HAZY (A) CLEAR   Specific Gravity, Urine 1.019 1.005 -  1.030   pH 5.0 5.0 - 8.0   Glucose, UA NEGATIVE NEGATIVE mg/dL   Hgb urine dipstick LARGE (A) NEGATIVE   Bilirubin Urine NEGATIVE NEGATIVE   Ketones, ur NEGATIVE NEGATIVE mg/dL   Protein, ur 30 (A) NEGATIVE mg/dL   Nitrite NEGATIVE NEGATIVE   Leukocytes,Ua NEGATIVE NEGATIVE   RBC / HPF >50 (H) 0 - 5 RBC/hpf   WBC, UA 11-20 0 - 5 WBC/hpf   Bacteria, UA NONE SEEN NONE SEEN   Mucus PRESENT     Comment: Performed at [facility], [place] [address], [place], [place] [address]  CBC     Status: Abnormal   Collection Time: 11/28/21  4:25 AM  Result Value Ref Range   WBC 12.5 (H) 4.0 - 10.5 K/uL   RBC 3.07 (L) 4.22 - 5.81 MIL/uL   Hemoglobin 10.3 (L) 13.0 - 17.0 g/dL   HCT 32.7 (L) 39.0 - 52.0 %   MCV 106.5 (H) 80.0 - 100.0 fL   MCH 33.6 26.0 - 34.0 pg   MCHC 31.5 30.0 - 36.0 g/dL   RDW 14.9 11.5 - 15.5 %   Platelets 201 150 - 400 K/uL   nRBC 0.0 0.0 - 0.2 %    Comment: Performed at [facility], [place] [address], [place], [place] [address]  Basic metabolic panel     Status: Abnormal   Collection Time: 11/28/21  4:25 AM  Result Value Ref Range   Sodium 136 135 - 145 mmol/L   Potassium 4.6 3.5 - 5.1 mmol/L   Chloride 94 (L) 98 - 111 mmol/L   CO2 34 (H) 22 - 32 mmol/L   Glucose, Bld 95 70 - 99 mg/dL    Comment: Glucose reference range applies only to samples taken after fasting for at least 8 hours.   BUN 20 8 - 23 mg/dL   Creatinine, Ser <0.30 (L) 0.61 - 1.24 mg/dL   Calcium 8.5 (L) 8.9 - 10.3 mg/dL   GFR, Estimated NOT CALCULATED >60 mL/min    Comment: (NOTE) Calculated using the CKD-EPI  Creatinine Equation (2021)    Anion gap 8 5 - 15    Comment: Performed at [facility], [place] [address], [place], [place] [address]  Phosphorus     Status: None   Collection Time: 11/28/21  4:25 AM  Result Value Ref Range   Phosphorus 3.1 2.5 - 4.6 mg/dL    Comment: Performed at [facility], [place] [address] [place], [place] [address]  Magnesium     Status: None   Collection Time: 11/28/21  4:25 AM  Result Value Ref Range   Magnesium 1.8 1.7 - 2.4 mg/dL    Comment: Performed at [facility], [place] [address], [place], [place] [address]    ECG   N/A  Telemetry   Sinus rhythm - Personally Reviewed  Radiology    DG Chest Port 1 View  Result Date: 11/28/2021 CLINICAL DATA:  Respiratory failure. EXAM: PORTABLE CHEST 1 VIEW COMPARISON:  CTA chest 11/26/2021 FINDINGS: The patient is rotated to the right. The heart is slightly enlarged. The central vessels are normal caliber. The upper lobes are mildly emphysematous. Moderate right and small left pleural effusions and overlying atelectasis or consolidation continue to be noted in the lung bases with the mid and upper lung fields remaining clear. Overall aeration appears unchanged. The aorta is tortuous and calcified. Stable mediastinum. A moderately displaced comminuted proximal right humeral fracture is again noted.  There is osteoporosis.  Mild levoscoliosis thoracic spine. IMPRESSION: 1. No essential change in right-greater-than-left pleural effusions and overlying basilar lung opacities. 2. Aortic atherosclerosis. 3. Displaced proximal right humeral fracture, with osteoporosis. Electronically Signed   By: Telford Nab M.D.   On: 11/28/2021 06:36   ECHOCARDIOGRAM COMPLETE  Result Date: 11/26/2021    ECHOCARDIOGRAM REPORT   Patient Name:   DUQUAN GILLOOLY Date of Exam: 11/26/2021 Medical Rec #:  [id number]            Height:       67.0 in Accession #:    [id number]           Weight:        104.5 lb Date of Birth:  1940/07/10            BSA:          1.535 m Patient Age:    67 years             BP:           172/103 mmHg Patient Gender: M                    HR:           106 bpm. Exam Location:  Inpatient Procedure: 2D Echo, 3D Echo, Cardiac Doppler and Color Doppler Indications:    R55 Syncope  History:        Patient has prior history of Echocardiogram examinations, most                 recent 08/10/2019. Aortic Valve Disease, Signs/Symptoms:Shortness                 of Breath and Dyspnea; Risk Factors:Hypertension and Current                 Smoker. ETOH. Aortic stenosis.  Sonographer:    Roseanna Rainbow RDCS Referring Phys: [id number] [facility]  Sonographer Comments: Suboptimal parasternal window and Technically difficult study due to poor echo windows. Very low parasternal. Could not move patient due to broken arm. IMPRESSIONS  1. Left ventricular ejection fraction, by estimation, is 50 to 55%. The left ventricle has low normal function. The left ventricle has no regional wall motion abnormalities. There is mild left ventricular hypertrophy. Left ventricular diastolic parameters are consistent with Grade II diastolic dysfunction (pseudonormalization). Elevated left atrial pressure.  2. Right ventricular systolic function is normal. The right ventricular size is normal. There is moderately elevated pulmonary artery systolic pressure. The estimated right ventricular systolic pressure is 92.9 mmHg.  3. Left atrial size was moderately dilated.  4. The mitral valve is degenerative. Mild mitral valve regurgitation. Mild mitral stenosis. The mean mitral valve gradient is 5.0 mmHg with average heart rate of 102 bpm. Severe mitral annular calcification.  5. The inferior vena cava is dilated in size with <50% respiratory variability, suggesting right atrial pressure of 15 mmHg.  6. The aortic valve was not well visualized. There is severe calcifcation of the aortic valve. Aortic valve regurgitation is not  visualized. Severe aortic valve stenosis. AS moderate by gradients (Vmax 3.6 m/s, MG 31 mmHg), severe by AVA (0.7 cm^2) and DI (0.21). Low SV index (35 cc/m^2), suspect paradoxical low flow low gradient severe AS FINDINGS  Left Ventricle: Left ventricular ejection fraction, by estimation, is 50 to 55%. The left ventricle has low normal function. The left ventricle has no regional wall motion abnormalities. The left ventricular internal cavity  size was normal in size. There is mild left ventricular hypertrophy. Left ventricular diastolic parameters are consistent with Grade II diastolic dysfunction (pseudonormalization). Elevated left atrial pressure. Right Ventricle: The right ventricular size is normal. No increase in right ventricular wall thickness. Right ventricular systolic function is normal. There is moderately elevated pulmonary artery systolic pressure. The tricuspid regurgitant velocity is 2.78 m/s, and with an assumed right atrial pressure of 15 mmHg, the estimated right ventricular systolic pressure is 40.1 mmHg. Left Atrium: Left atrial size was moderately dilated. Right Atrium: Right atrial size was normal in size. Pericardium: There is no evidence of pericardial effusion. Mitral Valve: The mitral valve is degenerative in appearance. Severe mitral annular calcification. Mild mitral valve regurgitation. Mild mitral valve stenosis. MV peak gradient, 13.5 mmHg. The mean mitral valve gradient is 5.0 mmHg with average heart rate of 102 bpm. Tricuspid Valve: The tricuspid valve is normal in structure. Tricuspid valve regurgitation is trivial. Aortic Valve: The aortic valve was not well visualized. There is severe calcifcation of the aortic valve. Aortic valve regurgitation is not visualized. Moderate to severe aortic stenosis is present. Aortic valve mean gradient measures 29.0 mmHg. Aortic valve peak gradient measures 50.2 mmHg. Aortic valve area, by VTI measures 0.80 cm. Pulmonic Valve: The pulmonic valve  was not well visualized. Pulmonic valve regurgitation is not visualized. Aorta: The aortic root and ascending aorta are structurally normal, with no evidence of dilitation. Venous: The inferior vena cava is dilated in size with less than 50% respiratory variability, suggesting right atrial pressure of 15 mmHg. IAS/Shunts: The interatrial septum was not well visualized.  LEFT VENTRICLE PLAX 2D LVIDd:         4.50 cm     Diastology LVIDs:         3.30 cm     LV e' medial:    6.53 cm/s LV PW:         0.90 cm     LV E/e' medial:  13.8 LV IVS:        1.20 cm     LV e' lateral:   4.35 cm/s LVOT diam:     2.10 cm     LV E/e' lateral: 20.7 LV SV:         53 LV SV Index:   35 LVOT Area:     3.46 cm                             3D Volume EF: LV Volumes (MOD)           3D EF:        56 % LV vol d, MOD A2C: 79.8 ml LV EDV:       102 ml LV vol d, MOD A4C: 50.5 ml LV ESV:       45 ml LV vol s, MOD A2C: 36.4 ml LV SV:        57 ml LV vol s, MOD A4C: 21.6 ml LV SV MOD A2C:     43.4 ml LV SV MOD A4C:     50.5 ml LV SV MOD BP:      34.4 ml RIGHT VENTRICLE             IVC RV S prime:     10.90 cm/s  IVC diam: 2.20 cm TAPSE (M-mode): 1.3 cm LEFT ATRIUM             Index  RIGHT ATRIUM           Index LA diam:        3.80 cm 2.48 cm/m   RA Area:     12.20 cm LA Vol (A2C):   78.9 ml 51.41 ml/m  RA Volume:   26.00 ml  16.94 ml/m LA Vol (A4C):   57.1 ml 37.21 ml/m LA Biplane Vol: 72.1 ml 46.98 ml/m  AORTIC VALVE AV Area (Vmax):    1.00 cm AV Area (Vmean):   0.93 cm AV Area (VTI):     0.80 cm AV Vmax:           354.40 cm/s AV Vmean:          252.000 cm/s AV VTI:            0.670 m AV Peak Grad:      50.2 mmHg AV Mean Grad:      29.0 mmHg LVOT Vmax:         102.00 cm/s LVOT Vmean:        67.800 cm/s LVOT VTI:          0.154 m LVOT/AV VTI ratio: 0.23  AORTA Ao Root diam: 3.50 cm MITRAL VALVE                TRICUSPID VALVE MV Area (PHT): 7.16 cm     TR Peak grad:   30.9 mmHg MV Area VTI:   2.01 cm     TR Vmax:        278.00  cm/s MV Peak grad:  13.5 mmHg MV Mean grad:  5.0 mmHg     SHUNTS MV Vmax:       1.84 m/s     Systemic VTI:  0.15 m MV Vmean:      110.0 cm/s   Systemic Diam: 2.10 cm MV Decel Time: 106 msec MV E velocity: 90.00 cm/s MV A velocity: 176.00 cm/s MV E/A ratio:  0.51 Oswaldo Milian MD Electronically signed by Oswaldo Milian MD Signature Date/Time: 11/26/2021/1:58:10 PM    Final    CT Angio Chest Pulmonary Embolism (PE) W or WO Contrast  Result Date: 11/26/2021 CLINICAL DATA:  Syncope, no other cardiac signs/symptoms r/o PE EXAM: CT ANGIOGRAPHY CHEST WITH CONTRAST TECHNIQUE: Multidetector CT imaging of the chest was performed using the standard protocol during bolus administration of intravenous contrast. Multiplanar CT image reconstructions and MIPs were obtained to evaluate the vascular anatomy. RADIATION DOSE REDUCTION: This exam was performed according to the departmental dose-optimization program which includes automated exposure control, adjustment of the mA and/or kV according to patient size and/or use of iterative reconstruction technique. CONTRAST:  24m OMNIPAQUE IOHEXOL 350 MG/ML SOLN COMPARISON:  CT angiogram from yesterday FINDINGS: Cardiovascular: Satisfactory opacification of the pulmonary arteries to the segmental level. No evidence of pulmonary embolism. Right pulmonary artery is prominent measuring 3.5 cm in greatest dimension. Moderate atheromatous calcifications of the thoracic aorta. Severe coronary artery calcifications. Normal heart size. No pericardial effusion. Mediastinum/Nodes: No enlarged mediastinal, hilar, or axillary lymph nodes. Thyroid gland, trachea, and esophagus demonstrate no significant findings. Lungs/Pleura: .Marland KitchenThere is small to moderate-sized consolidation with air air bronchograms seen at the right lung base and is more prominent in the present study. Scattered airspace opacities predominantly in the right lung stable. Small to moderate right and small left pleural  effusion with left basilar atelectasis. Upper Abdomen: Moderately severe atheromatous calcifications of the abdominal aorta and the visceral vasculature including the splenic artery. Musculoskeletal: Again seen is  the high-grade wedge deformity of the L1 vertebral body. Impacted angulated fracture of the partially visualized proximal right humerus. Disc degenerative disease and ankylosis of the thoracic spine. Review of the MIP images confirms the above findings. IMPRESSION: 1. No pulmonary embolism. Right pulmonary artery is prominent measuring 3.5 cm in greatest dimension. 2. Small to moderate consolidation with air bronchograms at the right lung base and is more prominent in the present study. Scattered airspace opacities predominantly in the right lung stable. 3.  Small to moderate right and small left pleural effusion. 4. Unchanged high-grade wedge deformity of the L1 vertebral body. Disc degenerative disease and ankylosis of the thoracic spine. Impacted angulated fracture of the partially seen proximal right humerus. Electronically Signed   By: Frazier Richards M.D.   On: 11/26/2021 11:56    Cardiac Studies   See above  Assessment   Principal Problem:   Acute respiratory failure with hypoxia and hypercapnia (HCC) Active Problems:   AAA (abdominal aortic aneurysm) (HCC)   PAD (peripheral artery disease) (HCC)   Pressure injury of skin   Aortic stenosis   Plan   Mr. Merk is improving. Had some hematuria overnight on heparin which was stopped. We discussed cath yesterday and he is agreeable. Will need R/LHC - but given right humeral fracture and left common femoral aneurysm, access through the right femoral artery/vein would be best. Will try to arrange [facility] for Friday at [facility]. Based on these findings, could then evaluate for TAVR candidacy.  Shared Decision Making/Informed Consent The risks [stroke (1 in 1000), death (1 in 1000), kidney failure [usually temporary] (1 in 500), bleeding (1  in 200), allergic reaction [possibly serious] (1 in 200)], benefits (diagnostic support and management of coronary artery disease) and alternatives of a cardiac catheterization were discussed in detail with Mr. Callaham and he is willing to proceed.   Time Spent Directly with Patient:  I have spent a total of 25 minutes with the patient reviewing hospital notes, telemetry, EKGs, labs and examining the patient as well as establishing an assessment and plan that was discussed personally with the patient.  > 50% of time was spent in direct patient care.  Length of Stay:  LOS: 3 days   Pixie Casino, MD, [facility], [place] Director of the Advanced Lipid Disorders &  Cardiovascular Risk Reduction Clinic Diplomate of the American Board of Clinical Lipidology Attending Cardiologist  Direct Dial: [phone]  Fax: [phone]  Website:  [email or web address] Kyndal Heringer 11/28/2021, 11:01 AM

## 2021-11-28 NOTE — Progress Notes (Addendum)
OT Cancellation Note  Patient Details Name: Ronnie Snyder MRN: [id number] DOB: Sep 29, 1940   Cancelled Treatment:    Reason Eval/Treat Not Completed: Patient declined, no reason specified Pt up in chair drinking milkshake. Educated pt on purpose of OT/eval and planned to check back to allow time to finish eating. However, pt appears resistant to this and reports he has too much going on and would "like to scratch OT off the list today". Will check back if schedule permits this afternoon vs follow up tomorrow.    Checked back at 1335, pt already back in bed and declined OT again citing fatigue. Will follow up tomorrow.   Layla Maw 11/28/2021, 12:13 PM

## 2021-11-28 NOTE — NC FL2 (Signed)
[place] LEVEL OF CARE SCREENING TOOL     IDENTIFICATION  Patient Name: Ronnie Snyder Birthdate: May 26, 1940 Sex: male Admission Date (Current Location): 11/25/2021  [facility] and [place] Number:  Herbalist and Address:  [facility],  [place], [place]      Provider Number: [id number]  Attending Physician Name and Address:  Georgette Shell, MD  Relative Name and Phone Number:       Current Level of Care: Hospital Recommended Level of Care: [place] Prior Approval Number:    Date Approved/Denied:   PASRR Number: [id number]  Discharge Plan: SNF    Current Diagnoses: Patient Active Problem List   Diagnosis Date Noted   Aortic stenosis 11/28/2021   Pressure injury of skin 11/26/2021   Acute respiratory failure with hypoxia and hypercapnia (HCC) 11/25/2021   Lumbar compression fracture ([place]) 05/02/2021   Weight loss 01/15/2021   Fatigue 05/30/2020   Benign neoplasm of colon    Gastric AVM    Iron deficiency anemia due to chronic blood loss 11/09/2019   Barrett's esophagus without dysplasia 08/30/2019   Steatosis of liver 08/19/2019   GI bleed 08/18/2019   Cerebral thrombosis with cerebral infarction 08/12/2019   Symptomatic anemia 08/09/2019   Acute respiratory failure with hypoxia (HCC) 08/09/2019   Urinary hesitancy 10/26/2018   Night sweats 10/26/2018   Other specified hypothyroidism 05/29/2018   Lumbar stenosis 05/23/2017   Senile purpura ([place]) 09/23/2013   PAD (peripheral artery disease) ([place]) 08/09/2013   Aftercare following surgery of the circulatory system, NEC 02/01/2013   Hyponatremia 08/21/2012   Smoker 08/13/2012   Other and unspecified alcohol dependence, unspecified drinking behavior 08/13/2012   Screening for prostate cancer 08/13/2012   Antiplatelet or antithrombotic long-term use 08/13/2012   Atherosclerosis of native artery of extremity with intermittent  claudication ([place]) 08/21/2011   ECZEMA 01/24/2010   COUGH 01/24/2009   AAA (abdominal aortic aneurysm) ([place]) 12/06/2008   HYPERCHOLESTEROLEMIA 01/21/2008   ERECTILE DYSFUNCTION, ORGANIC 01/21/2008   Back pain 01/21/2008   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   Anxiety state 03/07/2007   ERECTILE DYSFUNCTION 03/07/2007   DEPRESSION 03/07/2007   HTN (hypertension) 03/07/2007   CAROTID ARTERY STENOSIS 03/07/2007   HIP PAIN, RIGHT 03/07/2007   COLONIC POLYPS, HX OF 03/07/2007   ADENOIDECTOMY, HX OF 03/07/2007    Orientation RESPIRATION BLADDER Height & Weight     Self, Time, Situation, Place  O2 (6l/min/[place]) Continent Weight: 47.5 kg Height:  '5\' 7"'$  (170.2 cm)  BEHAVIORAL SYMPTOMS/MOOD NEUROLOGICAL BOWEL NUTRITION STATUS      Continent Diet (regular)  AMBULATORY STATUS COMMUNICATION OF NEEDS Skin   Extensive Assist Verbally Normal                       Personal Care Assistance Level of Assistance  Dressing, Feeding, Bathing           Functional Limitations Info  Sight, Hearing, Speech Sight Info: Adequate Hearing Info: Adequate Speech Info: Adequate    SPECIAL CARE FACTORS FREQUENCY  PT (By licensed PT), OT (By licensed OT)     PT Frequency: 5 x weekly OT Frequency: 5 x weekly            Contractures Contractures Info: Not present    Additional Factors Info  Code Status Code Status Info: full             Current Medications (11/28/2021):  This is the current hospital active medication list  Current Facility-Administered Medications  Medication Dose Route Frequency Provider Last Rate Last Admin   0.9 %  sodium chloride infusion   Intravenous PRN Sherrilyn Rist A, MD   Paused at 11/27/21 0746   acetaminophen (TYLENOL) tablet 650 mg  650 mg Oral Q4H PRN Julian Hy, DO       aspirin EC tablet 81 mg  81 mg Oral Daily Elsie Lincoln, MD   81 mg at 11/28/21 1022   azithromycin (ZITHROMAX) 500 mg in sodium chloride 0.9 % 250 mL IVPB  500 mg Intravenous Q24H Julian Hy, DO 250 mL/hr at 11/27/21 2200 500 mg at 11/27/21 2200   bethanechol (URECHOLINE) tablet 5 mg  5 mg Oral TID Shearon Stalls, Rahul P, PA-C   5 mg at 11/28/21 1021   cefTRIAXone (ROCEPHIN) 2 g in sodium chloride 0.9 % 100 mL IVPB  2 g Intravenous Q24H Noemi Chapel P, DO 200 mL/hr at 11/27/21 2058 2 g at 11/27/21 2058   Chlorhexidine Gluconate Cloth 2 % PADS 6 each  6 each Topical Daily Julian Hy, DO   6 each at 11/28/21 1252   docusate sodium (COLACE) capsule 100 mg  100 mg Oral BID PRN Julian Hy, DO   100 mg at 11/27/21 1616   fentaNYL (SUBLIMAZE) injection 0-25 mcg  0-25 mcg Intravenous Q2H PRN Desai, Rahul P, PA-C       folic acid (FOLVITE) tablet 1 mg  1 mg Oral Daily Noemi Chapel P, DO   1 mg at 11/28/21 1021   ipratropium-albuterol (DUONEB) 0.5-2.5 (3) MG/3ML nebulizer solution 3 mL  3 mL Nebulization Q6H PRN Noemi Chapel P, DO       ipratropium-albuterol (DUONEB) 0.5-2.5 (3) MG/3ML nebulizer solution 3 mL  3 mL Nebulization TID Olalere, Adewale A, MD   3 mL at 11/28/21 1343   labetalol (NORMODYNE) injection 10 mg  10 mg Intravenous Q4H PRN Elsie Lincoln, MD   10 mg at 11/27/21 1616   levothyroxine (SYNTHROID) tablet 75 mcg  75 mcg Oral Q0600 Noemi Chapel P, DO   75 mcg at 11/28/21 0600   ondansetron (ZOFRAN) injection 4 mg  4 mg Intravenous Q6H PRN Julian Hy, DO       Oral care mouth rinse  15 mL Mouth Rinse 4 times per day Noemi Chapel P, DO   15 mL at 11/28/21 1253   Oral care mouth rinse  15 mL Mouth Rinse PRN Olalere, Adewale A, MD       oxyCODONE (Oxy IR/ROXICODONE) immediate release tablet 5 mg  5 mg Oral Q4H PRN Georgette Shell, MD   5 mg at 11/28/21 1250   polyethylene glycol (MIRALAX / GLYCOLAX) packet 17 g  17 g Oral Daily PRN Noemi Chapel P, DO       rosuvastatin (CRESTOR) tablet 20 mg  20 mg Oral Daily Elsie Lincoln, MD   20 mg at 11/28/21 1021   tamsulosin (FLOMAX) capsule 0.4 mg  0.4 mg Oral Daily Noemi Chapel P, DO   0.4 mg at 11/28/21 1021   traMADol (ULTRAM)  tablet 50 mg  50 mg Oral Q6H PRN Elsie Lincoln, MD   50 mg at 11/28/21 0600     Discharge Medications: Please see discharge summary for a list of discharge medications.  Relevant Imaging Results:  Relevant Lab Results:   Additional Information QPR:[id number]  Leeroy Cha, RN

## 2021-11-28 NOTE — Progress Notes (Signed)
   11/28/21 1031  Vitals  Patient Position (if appropriate) Orthostatic Vitals  Orthostatic Lying   BP- Lying 132/80  Pulse- Lying 78  Orthostatic Sitting  BP- Sitting 134/79  Pulse- Sitting 75  Orthostatic Standing at 0 minutes  BP- Standing at 0 minutes 129/77  Pulse- Standing at 0 minutes 83  Orthostatic Standing at 3 minutes  BP- Standing at 3 minutes 126/79  Pulse- Standing at 3 minutes 90   Patient denied dizziness, required steadying assist.  Angie Fava, RN

## 2021-11-28 NOTE — Progress Notes (Signed)
Patient refuses nocturnal BiPAP tonight. RT will continue to follow and encourage use

## 2021-11-28 NOTE — TOC Progression Note (Signed)
Transition of Care [facility]) - Progression Note    Patient Details  Name: Ronnie Snyder MRN: [id number] Date of Birth: June 03, 1940  Transition of Care [facility]) CM/SW Contact  Leeroy Cha, RN Phone Number: 11/28/2021, 3:03 PM  Clinical Narrative:    Pt suggest rehab, process started,medicare no auth needed.   Expected Discharge Plan: [place] Barriers to Discharge: Continued Medical Work up  Expected Discharge Plan and Services Expected Discharge Plan: [place]   Discharge Planning Services: CM Consult   Living arrangements for the past 2 months: Single Family Home                                       Social Determinants of Health (SDOH) Interventions    Readmission Risk Interventions     No data to display

## 2021-11-29 DIAGNOSIS — I714 Abdominal aortic aneurysm, without rupture, unspecified: Secondary | ICD-10-CM | POA: Diagnosis not present

## 2021-11-29 DIAGNOSIS — J9602 Acute respiratory failure with hypercapnia: Secondary | ICD-10-CM | POA: Diagnosis not present

## 2021-11-29 DIAGNOSIS — I350 Nonrheumatic aortic (valve) stenosis: Secondary | ICD-10-CM | POA: Diagnosis not present

## 2021-11-29 DIAGNOSIS — I739 Peripheral vascular disease, unspecified: Secondary | ICD-10-CM | POA: Diagnosis not present

## 2021-11-29 DIAGNOSIS — J9601 Acute respiratory failure with hypoxia: Secondary | ICD-10-CM | POA: Diagnosis not present

## 2021-11-29 MED ORDER — IPRATROPIUM-ALBUTEROL 0.5-2.5 (3) MG/3ML IN SOLN
3.0000 mL | Freq: Two times a day (BID) | RESPIRATORY_TRACT | Status: DC
  Administered 2021-11-30 – 2021-12-01 (×2): 3 mL via RESPIRATORY_TRACT
  Filled 2021-11-29 (×3): qty 3

## 2021-11-29 NOTE — Progress Notes (Signed)
PROGRESS NOTE    Ronnie Snyder  WJX:[id number] DOB: March 19, 1941 DOA: 11/25/2021 PCP: Luetta Nutting, DO   Brief Narrative: PCCM pickup 11/28/2021, he was admitted 11/25/2021.  81 year old male admitted 2 days ago after a fall at home.  It appears that he had a syncopal event of unclear etiology.  He was found to be hypercapnic and was placed on BiPAP.  Patient suffered a right proximal humerus fracture with a fall. His past medical history significant for hypertension hypothyroidism hyperlipidemia AAA GERD PAD and stroke. Patient had acute urinary retention Foley catheter placed Echo EF 50 to 21% grade 2 diastolic dysfunction and severe aortic stenosis. Urine strep pneumo and Legionella negative  Assessment & Plan:   Principal Problem:   Acute respiratory failure with hypoxia and hypercapnia (HCC) Active Problems:   AAA (abdominal aortic aneurysm) (HCC)   PAD (peripheral artery disease) (HCC)   Pressure injury of skin   Aortic stenosis  #1 acute hypoxic hypercapnic respiratory failure due to community-acquired pneumonia and COPD exacerbation treated with BiPAP now on nasal cannula CT angiogram negative for pulmonary embolism. Continue BiPAP as needed. Check ambulatory oxygen saturation.  He did not have oxygen prior to admission to hospital. He continues to smoke some. He is being treated with Rocephin and azithromycin and Lasix he had bilateral pleural effusions. 7/13 now holding diuretics till after cath in am  #2 severe aortic stenosis with grade 2 diastolic dysfunction and EF of 50 to 55% seen by cardiology recommending right and left heart catheterization on Friday. Elevated troponin-peaked at 471. BNP 433  #3 displaced right humeral neck fracture status post fall Ortho was consulted Continue right upper extremity sling No immediate plans for surgery noted  #4 acute urinary retention placed Foley catheter continue Flomax Urecholine added for spasm Holding heparin as  patient had hematuria  #5 hypothyroidism on Synthroid  #6 hypertension pressure stable 128/65  #7 AAA history of peripheral artery disease on aspirin and statin   Pressure Injury 11/25/21 Sacrum Medial Stage 1 -  Intact skin with non-blanchable redness of a localized area usually over a bony prominence. (Active)  11/25/21 2230  Location: Sacrum  Location Orientation: Medial  Staging: Stage 1 -  Intact skin with non-blanchable redness of a localized area usually over a bony prominence.  Wound Description (Comments):   Present on Admission: Yes  Dressing Type Foam - Lift dressing to assess site every shift 11/28/21 2030     Estimated body mass index is 16.23 kg/m as calculated from the following:   Height as of this encounter: '5\' 7"'$  (1.702 m).   Weight as of this encounter: 47 kg.  DVT prophylaxis: SCD Code Status: Full code Family Communication: None at bedside Disposition Plan:  Status is: Inpatient Remains inpatient appropriate because: IV antibiotics COPD exacerbation pneumonia   Consultants:  PCCM and cardiology and Ortho  Procedures: Foley catheter placed 11/27/2021 Antimicrobials: Rocephin azithromycin  Subjective:feels better No new c/o  Objective: Vitals:   11/28/21 2041 11/29/21 0430 11/29/21 0500 11/29/21 0748  BP: 137/71 125/71    Pulse: 72 69    Resp: 18 20    Temp: (!) 97.5 F (36.4 C) 98.3 F (36.8 C)    TempSrc: Oral Oral    SpO2: 98% 97%  91%  Weight:   47 kg   Height:        Intake/Output Summary (Last 24 hours) at 11/29/2021 1312 Last data filed at 11/29/2021 1242 Gross per 24 hour  Intake 1879.36 ml  Output 525 ml  Net 1354.36 ml    Filed Weights   11/27/21 0500 11/28/21 0500 11/29/21 0500  Weight: 47 kg 47.5 kg 47 kg    Examination:  General exam: Appears in nad Respiratory system: Diminished to auscultation. Respiratory effort normal. Cardiovascular system: S1 & S2 heard, RRR. No JVD, murmurs, rubs, gallops or clicks. No pedal  edema. Gastrointestinal system: Abdomen is nondistended, soft and nontender. No organomegaly or masses felt. Normal bowel sounds heard. Central nervous system: Alert and oriented. No focal neurological deficits. Extremities: 1+ pitting edema Skin: No rashes, lesions or ulcers Psychiatry: Judgement and insight appear normal. Mood & affect appropriate.     Data Reviewed: I have personally reviewed following labs and imaging studies  CBC: Recent Labs  Lab 11/25/21 1740 11/25/21 1742 11/26/21 0303 11/26/21 2208 11/27/21 0157 11/28/21 0425  WBC 17.6*  --  20.9*  --  15.6* 12.5*  NEUTROABS 15.8*  --   --   --   --   --   HGB 11.0* 11.6* 11.3* 10.5* 10.2* 10.3*  HCT 34.7* 34.0* 34.9* 32.6* 31.7* 32.7*  MCV 107.1*  --  105.4*  --  106.0* 106.5*  PLT 242  --  240  --  232 144    Basic Metabolic Panel: Recent Labs  Lab 11/25/21 1740 11/25/21 1742 11/26/21 0802 11/27/21 0157 11/28/21 0425  NA 139 134* 140 140 136  K 4.7 4.7 3.3* 3.2* 4.6  CL 97* 93* 96* 97* 94*  CO2 32  --  32 34* 34*  GLUCOSE 181* 180* 120* 118* 95  BUN 32* 37* 26* 25* 20  CREATININE 0.62 0.60* 0.47* 0.56* <0.30*  CALCIUM 9.0  --  8.9 8.6* 8.5*  MG  --   --   --  1.5* 1.8  PHOS  --   --   --  2.8 3.1    GFR: CrCl cannot be calculated (This lab value cannot be used to calculate CrCl because it is not a number: <0.30). Liver Function Tests: Recent Labs  Lab 11/25/21 1740  AST 118*  ALT 100*  ALKPHOS 87  BILITOT 1.0  PROT 6.4*  ALBUMIN 3.3*    No results for input(s): "LIPASE", "AMYLASE" in the last 168 hours. No results for input(s): "AMMONIA" in the last 168 hours. Coagulation Profile: Recent Labs  Lab 11/25/21 2313  INR 1.3*    Cardiac Enzymes: No results for input(s): "CKTOTAL", "CKMB", "CKMBINDEX", "TROPONINI" in the last 168 hours. BNP (last 3 results) No results for input(s): "PROBNP" in the last 8760 hours. HbA1C: No results for input(s): "HGBA1C" in the last 72 hours. CBG: No  results for input(s): "GLUCAP" in the last 168 hours. Lipid Profile: No results for input(s): "CHOL", "HDL", "LDLCALC", "TRIG", "CHOLHDL", "LDLDIRECT" in the last 72 hours.  Thyroid Function Tests: No results for input(s): "TSH", "T4TOTAL", "FREET4", "T3FREE", "THYROIDAB" in the last 72 hours.  Anemia Panel: No results for input(s): "VITAMINB12", "FOLATE", "FERRITIN", "TIBC", "IRON", "RETICCTPCT" in the last 72 hours. Sepsis Labs: Recent Labs  Lab 11/25/21 2027 11/25/21 2313 11/26/21 0303  LATICACIDVEN 7.9* 2.6* 1.1     Recent Results (from the past 240 hour(s))  Blood culture (routine x 2)     Status: Abnormal   Collection Time: 11/25/21  6:09 PM   Specimen: BLOOD  Result Value Ref Range Status   Specimen Description   Final    BLOOD RIGHT ANTECUBITAL Performed at [facility] [address], [place], [place] [address]  Special Requests   Final    BOTTLES DRAWN AEROBIC AND ANAEROBIC Blood Culture adequate volume Performed at [facility] [address] [place], [place] [address]    Culture  Setup Time   Final    GRAM POSITIVE COCCI IN CLUSTERS AEROBIC BOTTLE ONLY CRITICAL RESULT CALLED TO, READ BACK BY AND VERIFIED WITH: PHARMD JIGNA GADHIA ON 11/26/21 @ 1717 BY DRT    Culture (A)  Final    STAPHYLOCOCCUS EPIDERMIDIS THE SIGNIFICANCE OF ISOLATING THIS ORGANISM FROM A SINGLE SET OF BLOOD CULTURES WHEN MULTIPLE SETS ARE DRAWN IS UNCERTAIN. PLEASE NOTIFY THE MICROBIOLOGY DEPARTMENT WITHIN ONE WEEK IF SPECIATION AND SENSITIVITIES ARE REQUIRED. Performed at [facility], [place] [address] [place], [place] [address]    Report Status 11/27/2021 FINAL  Final  Blood Culture ID Panel (Reflexed)     Status: Abnormal   Collection Time: 11/25/21  6:09 PM  Result Value Ref Range Status   Enterococcus faecalis NOT DETECTED NOT DETECTED Final   Enterococcus Faecium NOT DETECTED NOT DETECTED Final   Listeria monocytogenes NOT DETECTED NOT  DETECTED Final   Staphylococcus species DETECTED (A) NOT DETECTED Final    Comment: CRITICAL RESULT CALLED TO, READ BACK BY AND VERIFIED WITH: PHARMD JIGNA GADHIA ON 11/26/21 @ 1717 BY DRT    Staphylococcus aureus (BCID) NOT DETECTED NOT DETECTED Final   Staphylococcus epidermidis DETECTED (A) NOT DETECTED Final    Comment: CRITICAL RESULT CALLED TO, READ BACK BY AND VERIFIED WITH: PHARMD JIGNA GADHIA ON 11/26/21 @ 1717 BY DRT    Staphylococcus lugdunensis NOT DETECTED NOT DETECTED Final   Streptococcus species NOT DETECTED NOT DETECTED Final   Streptococcus agalactiae NOT DETECTED NOT DETECTED Final   Streptococcus pneumoniae NOT DETECTED NOT DETECTED Final   Streptococcus pyogenes NOT DETECTED NOT DETECTED Final   A.calcoaceticus-baumannii NOT DETECTED NOT DETECTED Final   Bacteroides fragilis NOT DETECTED NOT DETECTED Final   Enterobacterales NOT DETECTED NOT DETECTED Final   Enterobacter cloacae complex NOT DETECTED NOT DETECTED Final   Escherichia coli NOT DETECTED NOT DETECTED Final   Klebsiella aerogenes NOT DETECTED NOT DETECTED Final   Klebsiella oxytoca NOT DETECTED NOT DETECTED Final   Klebsiella pneumoniae NOT DETECTED NOT DETECTED Final   Proteus species NOT DETECTED NOT DETECTED Final   Salmonella species NOT DETECTED NOT DETECTED Final   Serratia marcescens NOT DETECTED NOT DETECTED Final   Haemophilus influenzae NOT DETECTED NOT DETECTED Final   Neisseria meningitidis NOT DETECTED NOT DETECTED Final   Pseudomonas aeruginosa NOT DETECTED NOT DETECTED Final   Stenotrophomonas maltophilia NOT DETECTED NOT DETECTED Final   Candida albicans NOT DETECTED NOT DETECTED Final   Candida auris NOT DETECTED NOT DETECTED Final   Candida glabrata NOT DETECTED NOT DETECTED Final   Candida krusei NOT DETECTED NOT DETECTED Final   Candida parapsilosis NOT DETECTED NOT DETECTED Final   Candida tropicalis NOT DETECTED NOT DETECTED Final   Cryptococcus neoformans/gattii NOT DETECTED NOT  DETECTED Final   Methicillin resistance mecA/C NOT DETECTED NOT DETECTED Final    Comment: Performed at [facility], [place]. [address], [place], [place] [address]  Blood culture (routine x 2)     Status: None (Preliminary result)   Collection Time: 11/25/21  6:14 PM   Specimen: BLOOD  Result Value Ref Range Status   Specimen Description   Final    BLOOD LEFT ANTECUBITAL Performed at [facility] [address] [place], [place] [address]    Special  Requests   Final    BOTTLES DRAWN AEROBIC AND ANAEROBIC Blood Culture results may not be optimal due to an inadequate volume of blood received in culture bottles Performed at [facility] [address] [place], [place] [address]    Culture   Final    NO GROWTH 3 DAYS Performed at [facility], [place] [address] [place], [place] [address]    Report Status PENDING  Incomplete  SARS Coronavirus 2 by RT PCR (hospital order, performed in [facility] hospital lab) *cepheid single result test* Anterior Nasal Swab     Status: None   Collection Time: 11/25/21  7:53 PM   Specimen: Anterior Nasal Swab  Result Value Ref Range Status   SARS Coronavirus 2 by RT PCR NEGATIVE NEGATIVE Final    Comment: (NOTE) SARS-CoV-2 target nucleic acids are NOT DETECTED.  The SARS-CoV-2 RNA is generally detectable in upper and lower respiratory specimens during the acute phase of infection. The lowest concentration of SARS-CoV-2 viral copies this assay can detect is 250 copies / mL. A negative result does not preclude SARS-CoV-2 infection and should not be used as the sole basis for treatment or other patient management decisions.  A negative result may occur with improper specimen collection / handling, submission of specimen other than nasopharyngeal swab, presence of viral mutation(s) within the areas targeted by this assay, and inadequate number of viral copies (<250 copies / mL). A negative result must be  combined with clinical observations, patient history, and epidemiological information.  Fact Sheet for Patients:   https://www.patel.info/  Fact Sheet for Healthcare Providers: https://hall.com/  This test is not yet approved or  cleared by the [place] FDA and has been authorized for detection and/or diagnosis of SARS-CoV-2 by FDA under an Emergency Use Authorization (EUA).  This EUA will remain in effect (meaning this test can be used) for the duration of the COVID-19 declaration under Section 564(b)(1) of the Act, 21 U.S.C. section 360bbb-3(b)(1), unless the authorization is terminated or revoked sooner.  Performed at [facility], [place] [address], [place], [place] [address]   Respiratory (~20 pathogens) panel by PCR     Status: None   Collection Time: 11/25/21  7:54 PM   Specimen: Anterior Nasal Swab; Respiratory  Result Value Ref Range Status   Adenovirus NOT DETECTED NOT DETECTED Final   Coronavirus 229E NOT DETECTED NOT DETECTED Final    Comment: (NOTE) The Coronavirus on the Respiratory Panel, DOES NOT test for the novel  Coronavirus (2019 nCoV)    Coronavirus HKU1 NOT DETECTED NOT DETECTED Final   Coronavirus NL63 NOT DETECTED NOT DETECTED Final   Coronavirus OC43 NOT DETECTED NOT DETECTED Final   Metapneumovirus NOT DETECTED NOT DETECTED Final   Rhinovirus / Enterovirus NOT DETECTED NOT DETECTED Final   Influenza A NOT DETECTED NOT DETECTED Final   Influenza B NOT DETECTED NOT DETECTED Final   Parainfluenza Virus 1 NOT DETECTED NOT DETECTED Final   Parainfluenza Virus 2 NOT DETECTED NOT DETECTED Final   Parainfluenza Virus 3 NOT DETECTED NOT DETECTED Final   Parainfluenza Virus 4 NOT DETECTED NOT DETECTED Final   Respiratory Syncytial Virus NOT DETECTED NOT DETECTED Final   Bordetella pertussis NOT DETECTED NOT DETECTED Final   Bordetella Parapertussis NOT DETECTED NOT DETECTED Final   Chlamydophila  pneumoniae NOT DETECTED NOT DETECTED Final   Mycoplasma pneumoniae NOT DETECTED NOT DETECTED Final    Comment: Performed at [facility], [place]. [place]  [address] [place], [place] [address]  MRSA Next Gen by PCR, Nasal     Status: None   Collection Time: 11/25/21 10:14 PM   Specimen: Nasal Mucosa; Nasal Swab  Result Value Ref Range Status   MRSA by PCR Next Gen NOT DETECTED NOT DETECTED Final    Comment: (NOTE) The GeneXpert MRSA Assay (FDA approved for NASAL specimens only), is one component of a comprehensive MRSA colonization surveillance program. It is not intended to diagnose MRSA infection nor to guide or monitor treatment for MRSA infections. Test performance is not FDA approved in patients less than 53 years old. Performed at [facility], [place] [address] [place], [place] [address]          Radiology Studies: [facility] Chest Port 1 View  Result Date: 11/28/2021 CLINICAL DATA:  Respiratory failure. EXAM: PORTABLE CHEST 1 VIEW COMPARISON:  CTA chest 11/26/2021 FINDINGS: The patient is rotated to the right. The heart is slightly enlarged. The central vessels are normal caliber. The upper lobes are mildly emphysematous. Moderate right and small left pleural effusions and overlying atelectasis or consolidation continue to be noted in the lung bases with the mid and upper lung fields remaining clear. Overall aeration appears unchanged. The aorta is tortuous and calcified. Stable mediastinum. A moderately displaced comminuted proximal right humeral fracture is again noted. There is osteoporosis.  Mild levoscoliosis thoracic spine. IMPRESSION: 1. No essential change in right-greater-than-left pleural effusions and overlying basilar lung opacities. 2. Aortic atherosclerosis. 3. Displaced proximal right humeral fracture, with osteoporosis. Electronically Signed   By: Telford Nab M.D.   On: 11/28/2021 06:36        Scheduled Meds:  aspirin EC  81 mg Oral Daily   bethanechol   5 mg Oral TID   Chlorhexidine Gluconate Cloth  6 each Topical Daily   folic acid  1 mg Oral Daily   ipratropium-albuterol  3 mL Nebulization TID   levothyroxine  75 mcg Oral Q0600   lidocaine  1 patch Transdermal Q24H   mouth rinse  15 mL Mouth Rinse 4 times per day   rosuvastatin  20 mg Oral Daily   tamsulosin  0.4 mg Oral Daily   Continuous Infusions:  sodium chloride Stopped (11/27/21 0746)   azithromycin (ZITHROMAX) 500 mg in sodium chloride 0.9 % 250 mL IVPB Stopped (11/28/21 2349)   cefTRIAXone (ROCEPHIN)  IV Stopped (11/28/21 2101)     LOS: 4 days    Time spent: 35 minutes  Georgette Shell, MD 11/29/2021, 1:12 PM

## 2021-11-29 NOTE — TOC Progression Note (Addendum)
Transition of Care [facility]) - Progression Note    Patient Details  Name: Ronnie Snyder MRN: [id number] Date of Birth: 1940/09/12  Transition of Care [facility]) CM/SW Contact  Leeroy Cha, RN Phone Number: 11/29/2021, 9:58 AM  Clinical Narrative:    Tct-scott Ferdinand per patients request.  Message left on voice mail to please return call to discuss snf placement. 1032-son scott returned call. Choices given to son and he wcb with decision before 12pm. 1050tcf-scott has chosen [facility]. 1117-kathy campbell notified of choice. 1254/text from Harriet Pho pt can be admitted [phone]. Md notified. Expected Discharge Plan: [place] Barriers to Discharge: Continued Medical Work up  Expected Discharge Plan and Services Expected Discharge Plan: [place]   Discharge Planning Services: CM Consult   Living arrangements for the past 2 months: Single Family Home                                       Social Determinants of Health (SDOH) Interventions    Readmission Risk Interventions     No data to display

## 2021-11-29 NOTE — Progress Notes (Signed)
DAILY PROGRESS NOTE   Patient Name: Ronnie Snyder Date of Encounter: 11/29/2021 Cardiologist: None  Chief Complaint   No complaints  Patient Profile   Ronnie Snyder is a 81 y.o. male with a history  of PAD s/p prior intervention of left lower extremity,  AAA, left common iliac artery aneurysm, left common femoral aneurysm, carotid artery disease, stroke, hypertension, hyperlipidemia, hypothyroidism, GERD, and hepatic steatosis who is being seen for the evaluation of severe aortic stenosis at the request of Dr. Ander Slade  Subjective   No issues overnight. Appears to be improving. Lytes are now normal. BNP mildly elevated. Recorded net positive yesterday. Will hold on diuresis until right heart cath results available tomorrow.  Objective   Vitals:   11/28/21 2041 11/29/21 0430 11/29/21 0500 11/29/21 0748  BP: 137/71 125/71    Pulse: 72 69    Resp: 18 20    Temp: (!) 97.5 F (36.4 C) 98.3 F (36.8 C)    TempSrc: Oral Oral    SpO2: 98% 97%  91%  Weight:   47 kg   Height:        Intake/Output Summary (Last 24 hours) at 11/29/2021 1144 Last data filed at 11/29/2021 0800 Gross per 24 hour  Intake 1879.36 ml  Output 525 ml  Net 1354.36 ml   Filed Weights   11/27/21 0500 11/28/21 0500 11/29/21 0500  Weight: 47 kg 47.5 kg 47 kg    Physical Exam   General appearance: alert, cachectic, and no distress Neck: no carotid bruit, no JVD, and thyroid not enlarged, symmetric, no tenderness/mass/nodules Lungs: diminished breath sounds bibasilar Heart: regular rate and rhythm and systolic murmur: systolic ejection 3/6, crescendo at 2nd right intercostal space Abdomen: soft, non-tender; bowel sounds normal; no masses,  no organomegaly Extremities: extremities normal, atraumatic, no cyanosis or edema Pulses: 2+ and symmetric Skin: Skin color, texture, turgor normal. No rashes or lesions Neurologic: Grossly normal Psych: Pleasant  Inpatient Medications    Scheduled  Meds:  aspirin EC  81 mg Oral Daily   bethanechol  5 mg Oral TID   Chlorhexidine Gluconate Cloth  6 each Topical Daily   folic acid  1 mg Oral Daily   ipratropium-albuterol  3 mL Nebulization TID   levothyroxine  75 mcg Oral Q0600   lidocaine  1 patch Transdermal Q24H   mouth rinse  15 mL Mouth Rinse 4 times per day   rosuvastatin  20 mg Oral Daily   tamsulosin  0.4 mg Oral Daily    Continuous Infusions:  sodium chloride Stopped (11/27/21 0746)   azithromycin (ZITHROMAX) 500 mg in sodium chloride 0.9 % 250 mL IVPB Stopped (11/28/21 2349)   cefTRIAXone (ROCEPHIN)  IV Stopped (11/28/21 2101)    PRN Meds: sodium chloride, acetaminophen, docusate sodium, fentaNYL (SUBLIMAZE) injection, ipratropium-albuterol, labetalol, ondansetron (ZOFRAN) IV, mouth rinse, oxyCODONE, polyethylene glycol, traMADol   Labs   Results for orders placed or performed during the hospital encounter of 11/25/21 (from the past 48 hour(s))  CBC     Status: Abnormal   Collection Time: 11/28/21  4:25 AM  Result Value Ref Range   WBC 12.5 (H) 4.0 - 10.5 K/uL   RBC 3.07 (L) 4.22 - 5.81 MIL/uL   Hemoglobin 10.3 (L) 13.0 - 17.0 g/dL   HCT 32.7 (L) 39.0 - 52.0 %   MCV 106.5 (H) 80.0 - 100.0 fL   MCH 33.6 26.0 - 34.0 pg   MCHC 31.5 30.0 - 36.0 g/dL   RDW 14.9 11.5 -  15.5 %   Platelets 201 150 - 400 K/uL   nRBC 0.0 0.0 - 0.2 %    Comment: Performed at [facility], [place] [address], [place], [place] [address]  Basic metabolic panel     Status: Abnormal   Collection Time: 11/28/21  4:25 AM  Result Value Ref Range   Sodium 136 135 - 145 mmol/L   Potassium 4.6 3.5 - 5.1 mmol/L   Chloride 94 (L) 98 - 111 mmol/L   CO2 34 (H) 22 - 32 mmol/L   Glucose, Bld 95 70 - 99 mg/dL    Comment: Glucose reference range applies only to samples taken after fasting for at least 8 hours.   BUN 20 8 - 23 mg/dL   Creatinine, Ser <0.30 (L) 0.61 - 1.24 mg/dL   Calcium 8.5 (L) 8.9 - 10.3 mg/dL   GFR, Estimated NOT  CALCULATED >60 mL/min    Comment: (NOTE) Calculated using the CKD-EPI Creatinine Equation (2021)    Anion gap 8 5 - 15    Comment: Performed at [facility], [place] [address] [place], [place] [address]  Phosphorus     Status: None   Collection Time: 11/28/21  4:25 AM  Result Value Ref Range   Phosphorus 3.1 2.5 - 4.6 mg/dL    Comment: Performed at [facility], [place] [address], [place], [place] [address]  Magnesium     Status: None   Collection Time: 11/28/21  4:25 AM  Result Value Ref Range   Magnesium 1.8 1.7 - 2.4 mg/dL    Comment: Performed at [facility], [place] [address], [place], [place] [address]    ECG   N/A  Telemetry   Sinus rhythm - Personally Reviewed  Radiology    DG Chest Port 1 View  Result Date: 11/28/2021 CLINICAL DATA:  Respiratory failure. EXAM: PORTABLE CHEST 1 VIEW COMPARISON:  CTA chest 11/26/2021 FINDINGS: The patient is rotated to the right. The heart is slightly enlarged. The central vessels are normal caliber. The upper lobes are mildly emphysematous. Moderate right and small left pleural effusions and overlying atelectasis or consolidation continue to be noted in the lung bases with the mid and upper lung fields remaining clear. Overall aeration appears unchanged. The aorta is tortuous and calcified. Stable mediastinum. A moderately displaced comminuted proximal right humeral fracture is again noted. There is osteoporosis.  Mild levoscoliosis thoracic spine. IMPRESSION: 1. No essential change in right-greater-than-left pleural effusions and overlying basilar lung opacities. 2. Aortic atherosclerosis. 3. Displaced proximal right humeral fracture, with osteoporosis. Electronically Signed   By: Telford Nab M.D.   On: 11/28/2021 06:36    Cardiac Studies   See above  Assessment   Principal Problem:   Acute respiratory failure with hypoxia and hypercapnia (HCC) Active Problems:   AAA (abdominal  aortic aneurysm) (HCC)   PAD (peripheral artery disease) (HCC)   Pressure injury of skin   Aortic stenosis   Plan   Plan for [facility] tomorrow to further assess AS and elevated troponin up to 471 - ?NSTEMI - ?possible TAVR candidate - as previously mentioned, would recommend right femoral access given left femoral aneurysm, AAA, PAD and right humeral fracture which is acute.  Shared Decision Making/Informed Consent The risks [stroke (1 in 1000), death (1 in 1000), kidney failure [usually temporary] (1 in 500), bleeding (1 in 200), allergic reaction [possibly serious] (1 in 200)], benefits (diagnostic support and management of coronary artery disease) and alternatives of a  cardiac catheterization were discussed in detail with Ronnie Snyder and he is willing to proceed.   Time Spent Directly with Patient:  I have spent a total of 25 minutes with the patient reviewing hospital notes, telemetry, EKGs, labs and examining the patient as well as establishing an assessment and plan that was discussed personally with the patient.  > 50% of time was spent in direct patient care.  Length of Stay:  LOS: 4 days   Pixie Casino, MD, [facility], [place] Director of the Advanced Lipid Disorders &  Cardiovascular Risk Reduction Clinic Diplomate of the American Board of Clinical Lipidology Attending Cardiologist  Direct Dial: [phone]  Fax: [phone]  Website:  [email or web address] 11/29/2021, 11:44 AM

## 2021-11-29 NOTE — Evaluation (Signed)
Occupational Therapy Evaluation Patient Details Name: Ronnie Snyder MRN: [id number] DOB: 12-23-1940 Today's Date: 11/29/2021   History of Present Illness 81 year old gentleman who presented to the ED after a fall.  He does not remember falling and reports suddenly losing consciousness.  He has been feeling poorly for several days. In ED O2 saturations were in the 30s, pt placed on BiPAP. Imaging showed R proximal humerus fx, plan is for conservative management with a sling. PMH: HTN, anemia.   Clinical Impression   Pt is a 81 year old male, seen after fall and RT proximal humerus displaced fracture without functional use of RT  dominant upper extremity secondary to shoulder precautions. Therapist provided education and instruction to patient  in regards to exercises, precautions, positioning, donning upper extremity clothing and bathing while maintaining shoulder precautions, and donning/doffing sling. Patient demonstrated HEP and will need reinforcement of compensatory ADLs, as needed. Patient needs Mod-Max assistance to donn shirt, Total Assist to don sling, and was provided with handout instruction on compensatory strategies to perform bathing using dangle method. Patient limited by decreased ROM in RT shoulder and pain so therefore will need some form of assistance at home, which is unavailable as pt is his wife's primary caregiver. For this reason, Patient is agreeable to going to postacute inpatient rehab and expressed that he and his wife will likely need to transition from home to a "Nursing home or somewhere like it."    Patient demonstrates good rehab potential, and should benefit from continued skilled occupational therapy services while in acute care to maximize safety, independence and quality of life at home.  Continued occupational therapy services in a SNF setting prior to return home is recommended.  ?        Recommendations for follow up therapy are one component of a  multi-disciplinary discharge planning process, led by the attending physician.  Recommendations may be updated based on patient status, additional functional criteria and insurance authorization.   Follow Up Recommendations  Skilled nursing-short term rehab (<3 hours/day)    Assistance Recommended at Discharge Frequent or constant Supervision/Assistance  Patient can return home with the following A lot of help with walking and/or transfers;A lot of help with bathing/dressing/bathroom;Assist for transportation;Help with stairs or ramp for entrance    Functional Status Assessment  Patient has had a recent decline in their functional status and demonstrates the ability to make significant improvements in function in a reasonable and predictable amount of time.  Equipment Recommendations  Other (comment) (Will defer to post-acute recommendations)    Recommendations for Other Services       Precautions / Restrictions Precautions Precautions: Fall Precaution Comments: fell PTA, pt cannot recall if he's had other falls in past 6 months Required Braces or Orthoses: Sling Restrictions Weight Bearing Restrictions: Yes RUE Weight Bearing: Non weight bearing Other Position/Activity Restrictions: no ROM to shoulder, may dangle, OK to do ROM to elbow/wrist/hand per ortho note.      Mobility Bed Mobility               General bed mobility comments: Pt received in recliner    Transfers                          Balance Overall balance assessment: Needs assistance Sitting-balance support: No upper extremity supported, Feet supported Sitting balance-Leahy Scale: Fair     Standing balance support: Single extremity supported Standing balance-Leahy Scale: Poor  ADL either performed or assessed with clinical judgement   ADL Overall ADL's : Needs assistance/impaired Eating/Feeding: Minimal assistance;Sitting   Grooming: Wash/dry  hands;Set up;Cueing for UE precautions   Upper Body Bathing: Minimal assistance;Sitting;Cueing for compensatory techniques;Cueing for UE precautions   Lower Body Bathing: Moderate assistance;Sitting/lateral leans   Upper Body Dressing : Maximal assistance;Cueing for sequencing;Cueing for compensatory techniques;Cueing for UE precautions;Sitting Upper Body Dressing Details (indicate cue type and reason): Total Assist to adjust sling. Pt educated on sequencing for UB dressing, but will need reinforcement and practice. Lower Body Dressing: Total assistance   Toilet Transfer: Moderate assistance Toilet Transfer Details (indicate cue type and reason): RT UE in sling, pt required cues to push with LUE from arm of chair with need of Moderate assist to power up. Pt able to decend back to recliner with Min As for control. Toileting- Clothing Manipulation and Hygiene: Moderate assistance;Sitting/lateral lean       Functional mobility during ADLs: Moderate assistance;Cueing for sequencing;Cueing for safety       Vision   Vision Assessment?: No apparent visual deficits     Perception     Praxis      Pertinent Vitals/Pain Pain Assessment Pain Assessment: 0-10 Pain Score: 7  Pain Location: R shoulder and general body aches since fall Pain Descriptors / Indicators: Shooting Pain Intervention(s): Limited activity within patient's tolerance, Monitored during session, Repositioned     Hand Dominance     Extremity/Trunk Assessment Upper Extremity Assessment Upper Extremity Assessment: RUE deficits/detail;LUE deficits/detail RUE Deficits / Details: R shoulder in sling. Pt able to demonstrate hand/wrist and forearm ROM WFL. Elbow limited due to pain. No ROM to shoulder. RUE: Unable to fully assess due to immobilization RUE Sensation: decreased light touch LUE Deficits / Details: Genrealized weakness grossly 4-/5 LUE Sensation: WNL LUE Coordination: WNL   Lower Extremity Assessment Lower  Extremity Assessment: Generalized weakness   Cervical / Trunk Assessment Cervical / Trunk Assessment: Kyphotic   Communication     Cognition Arousal/Alertness: Awake/alert Behavior During Therapy: WFL for tasks assessed/performed Overall Cognitive Status: No family/caregiver present to determine baseline cognitive functioning                                 General Comments: Oriented to all but year. Repeating "'82 or '83" despite cues that we are not in the 80s.     General Comments       Exercises Shoulder Exercises Elbow Flexion: AROM, Seated, Right, 5 reps Elbow Extension: AROM, Seated, Right, 5 reps (Sub max as blocked by lap) Wrist Flexion: AROM, Seated, Right, 10 reps Wrist Extension: 10 reps, Right, AROM Digit Composite Flexion: AROM, Right, 10 reps Composite Extension: AROM, Right, 10 reps Hand Exercises Forearm Supination: AROM, Right, 10 reps Forearm Pronation: AROM, Right, 10 reps   Shoulder Instructions      Home Living Family/patient expects to be discharged to:: Skilled nursing facility                                        Prior Functioning/Environment                          OT Problem List: Decreased range of motion;Decreased strength;Decreased coordination;Pain;Impaired sensation;Decreased activity tolerance;Impaired balance (sitting and/or standing);Decreased knowledge of use of DME or AE;Decreased knowledge of  precautions;Impaired UE functional use      OT Treatment/Interventions: Self-care/ADL training;Therapeutic exercise;Therapeutic activities;Patient/family education;DME and/or AE instruction;Balance training;Manual therapy    OT Goals(Current goals can be found in the care plan section) Acute Rehab OT Goals Patient Stated Goal: Not have to wear sling. Pt expressed that he does not like it. Pt also agreeable to SNF OT Goal Formulation: With patient Time For Goal Achievement: 12/13/21 Potential to  Achieve Goals: Good ADL Goals Pt Will Perform Upper Body Dressing: with min guard assist;sitting Pt Will Transfer to Toilet: with supervision;ambulating Pt Will Perform Toileting - Clothing Manipulation and hygiene: with min guard assist;sit to/from stand;sitting/lateral leans Pt/caregiver will Perform Home Exercise Program: Left upper extremity;With written HEP provided;Right Upper extremity (RT elbow/wrist/forearm/hand.  LT strengthening) Additional ADL Goal #1: Pt will demonstrate donning/doffing of sling, correct positioning of RT UE, and compensatory strategies for RT axilla hygiene using dangle method, all while correctly following all shoulder precautions/restrictions.  OT Frequency: Min 2X/week    Co-evaluation              AM-PAC OT "6 Clicks" Daily Activity     Outcome Measure Help from another person eating meals?: A Little Help from another person taking care of personal grooming?: A Little Help from another person toileting, which includes using toliet, bedpan, or urinal?: A Lot Help from another person bathing (including washing, rinsing, drying)?: A Lot Help from another person to put on and taking off regular upper body clothing?: A Lot Help from another person to put on and taking off regular lower body clothing?: Total 6 Click Score: 13   End of Session Equipment Utilized During Treatment: Oxygen  Activity Tolerance: Patient tolerated treatment well Patient left: in chair;with call bell/phone within reach  OT Visit Diagnosis: Unsteadiness on feet (R26.81);History of falling (Z91.81);Muscle weakness (generalized) (M62.81);Pain;Dizziness and giddiness (R42) Pain - Right/Left: Right Pain - part of body: Shoulder;Arm;Hand                Time: 0865-7846 OT Time Calculation (min): 26 min Charges:  OT General Charges $OT Visit: 1 Visit OT Evaluation $OT Eval Low Complexity: 1 Low OT Treatments $Therapeutic Exercise: 8-22 mins  Anderson Malta, [place] Office: [phone] 11/29/2021  Julien Girt 11/29/2021, 9:16 AM

## 2021-11-29 NOTE — H&P (View-Only) (Signed)
DAILY PROGRESS NOTE   Patient Name: Ronnie Snyder Date of Encounter: 11/29/2021 Cardiologist: None  Chief Complaint   No complaints  Patient Profile   Ronnie Snyder is a 81 y.o. male with a history  of PAD s/p prior intervention of left lower extremity,  AAA, left common iliac artery aneurysm, left common femoral aneurysm, carotid artery disease, stroke, hypertension, hyperlipidemia, hypothyroidism, GERD, and hepatic steatosis who is being seen for the evaluation of severe aortic stenosis at the request of Dr. Ander Slade  Subjective   No issues overnight. Appears to be improving. Lytes are now normal. BNP mildly elevated. Recorded net positive yesterday. Will hold on diuresis until right heart cath results available tomorrow.  Objective   Vitals:   11/28/21 2041 11/29/21 0430 11/29/21 0500 11/29/21 0748  BP: 137/71 125/71    Pulse: 72 69    Resp: 18 20    Temp: (!) 97.5 F (36.4 C) 98.3 F (36.8 C)    TempSrc: Oral Oral    SpO2: 98% 97%  91%  Weight:   47 kg   Height:        Intake/Output Summary (Last 24 hours) at 11/29/2021 1144 Last data filed at 11/29/2021 0800 Gross per 24 hour  Intake 1879.36 ml  Output 525 ml  Net 1354.36 ml   Filed Weights   11/27/21 0500 11/28/21 0500 11/29/21 0500  Weight: 47 kg 47.5 kg 47 kg    Physical Exam   General appearance: alert, cachectic, and no distress Neck: no carotid bruit, no JVD, and thyroid not enlarged, symmetric, no tenderness/mass/nodules Lungs: diminished breath sounds bibasilar Heart: regular rate and rhythm and systolic murmur: systolic ejection 3/6, crescendo at 2nd right intercostal space Abdomen: soft, non-tender; bowel sounds normal; no masses,  no organomegaly Extremities: extremities normal, atraumatic, no cyanosis or edema Pulses: 2+ and symmetric Skin: Skin color, texture, turgor normal. No rashes or lesions Neurologic: Grossly normal Psych: Pleasant  Inpatient Medications    Scheduled  Meds:  aspirin EC  81 mg Oral Daily   bethanechol  5 mg Oral TID   Chlorhexidine Gluconate Cloth  6 each Topical Daily   folic acid  1 mg Oral Daily   ipratropium-albuterol  3 mL Nebulization TID   levothyroxine  75 mcg Oral Q0600   lidocaine  1 patch Transdermal Q24H   mouth rinse  15 mL Mouth Rinse 4 times per day   rosuvastatin  20 mg Oral Daily   tamsulosin  0.4 mg Oral Daily    Continuous Infusions:  sodium chloride Stopped (11/27/21 0746)   azithromycin (ZITHROMAX) 500 mg in sodium chloride 0.9 % 250 mL IVPB Stopped (11/28/21 2349)   cefTRIAXone (ROCEPHIN)  IV Stopped (11/28/21 2101)    PRN Meds: sodium chloride, acetaminophen, docusate sodium, fentaNYL (SUBLIMAZE) injection, ipratropium-albuterol, labetalol, ondansetron (ZOFRAN) IV, mouth rinse, oxyCODONE, polyethylene glycol, traMADol   Labs   Results for orders placed or performed during the hospital encounter of 11/25/21 (from the past 48 hour(s))  CBC     Status: Abnormal   Collection Time: 11/28/21  4:25 AM  Result Value Ref Range   WBC 12.5 (H) 4.0 - 10.5 K/uL   RBC 3.07 (L) 4.22 - 5.81 MIL/uL   Hemoglobin 10.3 (L) 13.0 - 17.0 g/dL   HCT 32.7 (L) 39.0 - 52.0 %   MCV 106.5 (H) 80.0 - 100.0 fL   MCH 33.6 26.0 - 34.0 pg   MCHC 31.5 30.0 - 36.0 g/dL   RDW 14.9 11.5 -  15.5 %   Platelets 201 150 - 400 K/uL   nRBC 0.0 0.0 - 0.2 %    Comment: Performed at [facility], [place] [address], [place], [place] [address]  Basic metabolic panel     Status: Abnormal   Collection Time: 11/28/21  4:25 AM  Result Value Ref Range   Sodium 136 135 - 145 mmol/L   Potassium 4.6 3.5 - 5.1 mmol/L   Chloride 94 (L) 98 - 111 mmol/L   CO2 34 (H) 22 - 32 mmol/L   Glucose, Bld 95 70 - 99 mg/dL    Comment: Glucose reference range applies only to samples taken after fasting for at least 8 hours.   BUN 20 8 - 23 mg/dL   Creatinine, Ser <0.30 (L) 0.61 - 1.24 mg/dL   Calcium 8.5 (L) 8.9 - 10.3 mg/dL   GFR, Estimated NOT  CALCULATED >60 mL/min    Comment: (NOTE) Calculated using the CKD-EPI Creatinine Equation (2021)    Anion gap 8 5 - 15    Comment: Performed at [facility], [place] [address], [place], [place] [address]  Phosphorus     Status: None   Collection Time: 11/28/21  4:25 AM  Result Value Ref Range   Phosphorus 3.1 2.5 - 4.6 mg/dL    Comment: Performed at [facility], [place] [address], [place], [place] [address]  Magnesium     Status: None   Collection Time: 11/28/21  4:25 AM  Result Value Ref Range   Magnesium 1.8 1.7 - 2.4 mg/dL    Comment: Performed at [facility], [place] [address], [place],  [address]    ECG   N/A  Telemetry   Sinus rhythm - Personally Reviewed  Radiology    DG Chest Port 1 View  Result Date: 11/28/2021 CLINICAL DATA:  Respiratory failure. EXAM: PORTABLE CHEST 1 VIEW COMPARISON:  CTA chest 11/26/2021 FINDINGS: The patient is rotated to the right. The heart is slightly enlarged. The central vessels are normal caliber. The upper lobes are mildly emphysematous. Moderate right and small left pleural effusions and overlying atelectasis or consolidation continue to be noted in the lung bases with the mid and upper lung fields remaining clear. Overall aeration appears unchanged. The aorta is tortuous and calcified. Stable mediastinum. A moderately displaced comminuted proximal right humeral fracture is again noted. There is osteoporosis.  Mild levoscoliosis thoracic spine. IMPRESSION: 1. No essential change in right-greater-than-left pleural effusions and overlying basilar lung opacities. 2. Aortic atherosclerosis. 3. Displaced proximal right humeral fracture, with osteoporosis. Electronically Signed   By: Telford Nab M.D.   On: 11/28/2021 06:36    Cardiac Studies   See above  Assessment   Principal Problem:   Acute respiratory failure with hypoxia and hypercapnia (HCC) Active Problems:   AAA (abdominal  aortic aneurysm) (HCC)   PAD (peripheral artery disease) (HCC)   Pressure injury of skin   Aortic stenosis   Plan   Plan for [facility] tomorrow to further assess AS and elevated troponin up to 471 - ?NSTEMI - ?possible TAVR candidate - as previously mentioned, would recommend right femoral access given left femoral aneurysm, AAA, PAD and right humeral fracture which is acute.  Shared Decision Making/Informed Consent The risks [stroke (1 in 1000), death (1 in 1000), kidney failure [usually temporary] (1 in 500), bleeding (1 in 200), allergic reaction [possibly serious] (1 in 200)], benefits (diagnostic support and management of coronary artery disease) and alternatives of a  cardiac catheterization were discussed in detail with Mr. Rufener and he is willing to proceed.   Time Spent Directly with Patient:  I have spent a total of 25 minutes with the patient reviewing hospital notes, telemetry, EKGs, labs and examining the patient as well as establishing an assessment and plan that was discussed personally with the patient.  > 50% of time was spent in direct patient care.  Length of Stay:  LOS: 4 days   Pixie Casino, MD, [facility], [place] Director of the Advanced Lipid Disorders &  Cardiovascular Risk Reduction Clinic Diplomate of the American Board of Clinical Lipidology Attending Cardiologist  Direct Dial: [phone]  Fax: [phone]  Website:  [email or web address] 11/29/2021, 11:44 AM

## 2021-11-29 NOTE — Progress Notes (Signed)
Patient continues to refuse nocturnal BiPAP

## 2021-11-29 NOTE — TOC Progression Note (Signed)
Transition of Care [facility]) - Progression Note    Patient Details  Name: Ronnie Snyder MRN: [id number] Date of Birth: 18-Jul-1940  Transition of Care [facility]) CM/SW Contact  Leeroy Cha, RN Phone Number: 11/29/2021, 7:35 AM  Clinical Narrative:    Has several bed offers for snf placement. Will review with patient and family to see where he wants to go .   Expected Discharge Plan: [place] Barriers to Discharge: Continued Medical Work up  Expected Discharge Plan and Services Expected Discharge Plan: [place]   Discharge Planning Services: CM Consult   Living arrangements for the past 2 months: Single Family Home                                       Social Determinants of Health (SDOH) Interventions    Readmission Risk Interventions     No data to display

## 2021-11-30 ENCOUNTER — Encounter (HOSPITAL_COMMUNITY): Payer: Self-pay | Admitting: Critical Care Medicine

## 2021-11-30 ENCOUNTER — Inpatient Hospital Stay (HOSPITAL_COMMUNITY)
Admission: EM | Disposition: A | Discharge status: Skilled Nursing Facility | Payer: Self-pay | Source: Home / Self Care | Attending: Internal Medicine

## 2021-11-30 DIAGNOSIS — I350 Nonrheumatic aortic (valve) stenosis: Secondary | ICD-10-CM | POA: Diagnosis not present

## 2021-11-30 DIAGNOSIS — I2510 Atherosclerotic heart disease of native coronary artery without angina pectoris: Secondary | ICD-10-CM | POA: Diagnosis not present

## 2021-11-30 DIAGNOSIS — J9602 Acute respiratory failure with hypercapnia: Secondary | ICD-10-CM | POA: Diagnosis not present

## 2021-11-30 DIAGNOSIS — J9601 Acute respiratory failure with hypoxia: Secondary | ICD-10-CM | POA: Diagnosis not present

## 2021-11-30 HISTORY — PX: RIGHT/LEFT HEART CATH AND CORONARY ANGIOGRAPHY: CATH118266

## 2021-11-30 LAB — POCT I-STAT EG7
Acid-Base Excess: 11 mmol/L — ABNORMAL HIGH (ref 0.0–2.0)
Acid-Base Excess: 11 mmol/L — ABNORMAL HIGH (ref 0.0–2.0)
Bicarbonate: 38.2 mmol/L — ABNORMAL HIGH (ref 20.0–28.0)
Bicarbonate: 38.4 mmol/L — ABNORMAL HIGH (ref 20.0–28.0)
Calcium, Ion: 1.23 mmol/L (ref 1.15–1.40)
Calcium, Ion: 1.24 mmol/L (ref 1.15–1.40)
HCT: 29 % — ABNORMAL LOW (ref 39.0–52.0)
HCT: 30 % — ABNORMAL LOW (ref 39.0–52.0)
Hemoglobin: 10.2 g/dL — ABNORMAL LOW (ref 13.0–17.0)
Hemoglobin: 9.9 g/dL — ABNORMAL LOW (ref 13.0–17.0)
O2 Saturation: 68 %
O2 Saturation: 70 %
Potassium: 4.1 mmol/L (ref 3.5–5.1)
Potassium: 4.1 mmol/L (ref 3.5–5.1)
Sodium: 131 mmol/L — ABNORMAL LOW (ref 135–145)
Sodium: 131 mmol/L — ABNORMAL LOW (ref 135–145)
TCO2: 40 mmol/L — ABNORMAL HIGH (ref 22–32)
TCO2: 40 mmol/L — ABNORMAL HIGH (ref 22–32)
pCO2, Ven: 66.6 mmHg — ABNORMAL HIGH (ref 44–60)
pCO2, Ven: 66.6 mmHg — ABNORMAL HIGH (ref 44–60)
pH, Ven: 7.367 (ref 7.25–7.43)
pH, Ven: 7.369 (ref 7.25–7.43)
pO2, Ven: 38 mmHg (ref 32–45)
pO2, Ven: 40 mmHg (ref 32–45)

## 2021-11-30 LAB — COMPREHENSIVE METABOLIC PANEL
ALT: 58 U/L — ABNORMAL HIGH (ref 0–44)
AST: 30 U/L (ref 15–41)
Albumin: 2.6 g/dL — ABNORMAL LOW (ref 3.5–5.0)
Alkaline Phosphatase: 73 U/L (ref 38–126)
Anion gap: 5 (ref 5–15)
BUN: 9 mg/dL (ref 8–23)
CO2: 38 mmol/L — ABNORMAL HIGH (ref 22–32)
Calcium: 8.5 mg/dL — ABNORMAL LOW (ref 8.9–10.3)
Chloride: 91 mmol/L — ABNORMAL LOW (ref 98–111)
Creatinine, Ser: 0.33 mg/dL — ABNORMAL LOW (ref 0.61–1.24)
GFR, Estimated: >60 mL/min (ref >60)
Glucose, Bld: 109 mg/dL — ABNORMAL HIGH (ref 70–99)
Potassium: 4.2 mmol/L (ref 3.5–5.1)
Sodium: 134 mmol/L — ABNORMAL LOW (ref 135–145)
Total Bilirubin: 0.5 mg/dL (ref 0.3–1.2)
Total Protein: 5.4 g/dL — ABNORMAL LOW (ref 6.5–8.1)

## 2021-11-30 LAB — POCT I-STAT 7, (LYTES, BLD GAS, ICA,H+H)
Acid-Base Excess: 13 mmol/L — ABNORMAL HIGH (ref 0.0–2.0)
Bicarbonate: 39.4 mmol/L — ABNORMAL HIGH (ref 20.0–28.0)
Calcium, Ion: 1.22 mmol/L (ref 1.15–1.40)
HCT: 29 % — ABNORMAL LOW (ref 39.0–52.0)
Hemoglobin: 9.9 g/dL — ABNORMAL LOW (ref 13.0–17.0)
O2 Saturation: 98 %
Potassium: 4.1 mmol/L (ref 3.5–5.1)
Sodium: 130 mmol/L — ABNORMAL LOW (ref 135–145)
TCO2: 41 mmol/L — ABNORMAL HIGH (ref 22–32)
pCO2 arterial: 63.8 mmHg — ABNORMAL HIGH (ref 32–48)
pH, Arterial: 7.399 (ref 7.35–7.45)
pO2, Arterial: 120 mmHg — ABNORMAL HIGH (ref 83–108)

## 2021-11-30 LAB — CBC
HCT: 32.6 % — ABNORMAL LOW (ref 39.0–52.0)
HCT: 31.2 % — ABNORMAL LOW (ref 39.0–52.0)
Hemoglobin: 10.5 g/dL — ABNORMAL LOW (ref 13.0–17.0)
Hemoglobin: 10.4 g/dL — ABNORMAL LOW (ref 13.0–17.0)
MCH: 33.5 pg (ref 26.0–34.0)
MCH: 33.8 pg (ref 26.0–34.0)
MCHC: 32.2 g/dL (ref 30.0–36.0)
MCHC: 33.3 g/dL (ref 30.0–36.0)
MCV: 104.2 fL — ABNORMAL HIGH (ref 80.0–100.0)
MCV: 101.3 fL — ABNORMAL HIGH (ref 80.0–100.0)
Platelets: 185 10*3/uL (ref 150–400)
Platelets: 194 10*3/uL (ref 150–400)
RBC: 3.13 MIL/uL — ABNORMAL LOW (ref 4.22–5.81)
RBC: 3.08 MIL/uL — ABNORMAL LOW (ref 4.22–5.81)
RDW: 15.1 % (ref 11.5–15.5)
RDW: 14.7 % (ref 11.5–15.5)
WBC: 11.1 10*3/uL — ABNORMAL HIGH (ref 4.0–10.5)
WBC: 12.6 10*3/uL — ABNORMAL HIGH (ref 4.0–10.5)
nRBC: 0 % (ref 0.0–0.2)
nRBC: 0 % (ref 0.0–0.2)

## 2021-11-30 LAB — CREATININE, SERUM
Creatinine, Ser: 0.35 mg/dL — ABNORMAL LOW (ref 0.61–1.24)
GFR, Estimated: >60 mL/min (ref >60)

## 2021-11-30 SURGERY — RIGHT/LEFT HEART CATH AND CORONARY ANGIOGRAPHY
Anesthesia: LOCAL

## 2021-11-30 MED ORDER — HEPARIN SODIUM (PORCINE) 5000 UNIT/ML IJ SOLN
5000.0000 [IU] | Freq: Three times a day (TID) | INTRAMUSCULAR | Status: DC
Start: 2021-11-30 — End: 2021-12-03
  Administered 2021-11-30 – 2021-12-03 (×8): 5000 [IU] via SUBCUTANEOUS
  Filled 2021-11-30 (×8): qty 1

## 2021-11-30 MED ORDER — SODIUM CHLORIDE 0.9 % IV SOLN
INTRAVENOUS | Status: DC
Start: 2021-11-30 — End: 2021-11-30

## 2021-11-30 MED ORDER — ACETAMINOPHEN 325 MG PO TABS
650.0000 mg | ORAL_TABLET | ORAL | Status: DC | PRN
  Administered 2021-11-30 – 2021-12-02 (×5): 650 mg via ORAL
  Filled 2021-11-30 (×3): qty 2

## 2021-11-30 MED ORDER — LABETALOL HCL 5 MG/ML IV SOLN: 10.0000 mg | INTRAVENOUS | Status: AC | PRN

## 2021-11-30 MED ORDER — SODIUM CHLORIDE 0.9% FLUSH: 3.0000 mL | Freq: Two times a day (BID) | INTRAVENOUS | Status: DC

## 2021-11-30 MED ORDER — ASPIRIN 81 MG PO CHEW
81.0000 mg | CHEWABLE_TABLET | ORAL | Status: AC
  Administered 2021-11-30: 81 mg via ORAL
  Filled 2021-11-30: qty 1

## 2021-11-30 MED ORDER — LIDOCAINE HCL (PF) 1 % IJ SOLN
INTRAMUSCULAR | Status: AC
  Filled 2021-11-30: qty 30

## 2021-11-30 MED ORDER — HEPARIN (PORCINE) IN NACL 1000-0.9 UT/500ML-% IV SOLN
INTRAVENOUS | Status: AC
  Filled 2021-11-30: qty 1000

## 2021-11-30 MED ORDER — IOHEXOL 350 MG/ML SOLN
INTRAVENOUS | Status: DC | PRN
  Administered 2021-11-30: 50 mL

## 2021-11-30 MED ORDER — SODIUM CHLORIDE 0.9% FLUSH
3.0000 mL | Freq: Two times a day (BID) | INTRAVENOUS | Status: DC
  Administered 2021-11-30 – 2021-12-01 (×3): 3 mL via INTRAVENOUS

## 2021-11-30 MED ORDER — SODIUM CHLORIDE 0.9 % IV SOLN: 250.0000 mL | INTRAVENOUS | Status: DC | PRN

## 2021-11-30 MED ORDER — MIDAZOLAM HCL 2 MG/2ML IJ SOLN
INTRAMUSCULAR | Status: AC
  Filled 2021-11-30: qty 2

## 2021-11-30 MED ORDER — SODIUM CHLORIDE 0.9 % IV SOLN: INTRAVENOUS | Status: AC

## 2021-11-30 MED ORDER — SODIUM CHLORIDE 0.9% FLUSH: 3.0000 mL | INTRAVENOUS | Status: DC | PRN

## 2021-11-30 MED ORDER — HEPARIN (PORCINE) IN NACL 1000-0.9 UT/500ML-% IV SOLN
INTRAVENOUS | Status: DC | PRN
  Administered 2021-11-30 (×2): 500 mL

## 2021-11-30 MED ORDER — HYDRALAZINE HCL 20 MG/ML IJ SOLN
10.0000 mg | INTRAMUSCULAR | Status: AC | PRN
  Administered 2021-11-30: 10 mg via INTRAVENOUS

## 2021-11-30 MED ORDER — LIDOCAINE HCL (PF) 1 % IJ SOLN
INTRAMUSCULAR | Status: DC | PRN
  Administered 2021-11-30: 15 mL

## 2021-11-30 MED ORDER — FENTANYL CITRATE (PF) 100 MCG/2ML IJ SOLN
INTRAMUSCULAR | Status: AC
  Filled 2021-11-30: qty 2

## 2021-11-30 MED ORDER — HYDRALAZINE HCL 20 MG/ML IJ SOLN
INTRAMUSCULAR | Status: AC
  Filled 2021-11-30: qty 1

## 2021-11-30 SURGICAL SUPPLY — 17 items
CATH INFINITI 5FR AL1 (CATHETERS) ×1 IMPLANT
CATH INFINITI 5FR MULTPACK ANG (CATHETERS) ×1 IMPLANT
CATH SWAN GANZ 7F STRAIGHT (CATHETERS) ×1 IMPLANT
GLIDESHEATH SLEND SS 6F .021 (SHEATH) IMPLANT
GUIDEWIRE INQWIRE 1.5J.035X260 (WIRE) IMPLANT
INQWIRE 1.5J .035X260CM (WIRE)
KIT HEART LEFT (KITS) ×2 IMPLANT
PACK CARDIAC CATHETERIZATION (CUSTOM PROCEDURE TRAY) ×2 IMPLANT
SHEATH PINNACLE 5F 10CM (SHEATH) ×1 IMPLANT
SHEATH PINNACLE 7F 10CM (SHEATH) ×1 IMPLANT
SHEATH PROBE COVER 6X72 (BAG) ×1 IMPLANT
TRANSDUCER W/STOPCOCK (MISCELLANEOUS) ×2 IMPLANT
TUBING CIL FLEX 10 FLL-RA (TUBING) ×2 IMPLANT
WIRE EMERALD 3MM-J .035X150CM (WIRE) ×1 IMPLANT
WIRE EMERALD 3MM-J .035X260CM (WIRE) ×1 IMPLANT
WIRE EMERALD ST .035X150CM (WIRE) ×1 IMPLANT
WIRE HI TORQ VERSACORE-J 145CM (WIRE) ×1 IMPLANT

## 2021-11-30 NOTE — Progress Notes (Signed)
Neb not done due to pt not in room.

## 2021-11-30 NOTE — TOC Progression Note (Signed)
Transition of Care [facility]) - Progression Note    Patient Details  Name: Ronnie Snyder MRN: [id number] Date of Birth: 1941-02-16  Transition of Care [facility]) CM/SW [place], [place] Phone Number: 11/30/2021, 5:23 PM  Clinical Narrative:     Patient has SNF bed at [facility] when medically ready for dc. CSW will continue to follow.  Expected Discharge Plan: [place] Barriers to Discharge: Continued Medical Work up  Expected Discharge Plan and Services Expected Discharge Plan: [place]   Discharge Planning Services: CM Consult   Living arrangements for the past 2 months: Single Family Home                                       Social Determinants of Health (SDOH) Interventions    Readmission Risk Interventions     No data to display

## 2021-11-30 NOTE — Interval H&P Note (Signed)
Cath Lab Visit (complete for each Cath Lab visit)  Clinical Evaluation Leading to the Procedure:   ACS: Yes.    Non-ACS:    Anginal Classification: CCS IV  Anti-ischemic medical therapy: Minimal Therapy (1 class of medications)  Non-Invasive Test Results: High-risk stress test findings: cardiac mortality >3%/year  Prior CABG: No previous CABG      History and Physical Interval Note:  11/30/2021 8:54 AM  Ronnie Snyder  has presented today for surgery, with the diagnosis of nstemi, severe as.  The various methods of treatment have been discussed with the patient and family. After consideration of risks, benefits and other options for treatment, the patient has consented to  Procedure(s): RIGHT/LEFT HEART CATH AND CORONARY ANGIOGRAPHY (N/A) as a surgical intervention.  The patient's history has been reviewed, patient examined, no change in status, stable for surgery.  I have reviewed the patient's chart and labs.  Questions were answered to the patient's satisfaction.     Ronnie Snyder

## 2021-11-30 NOTE — Progress Notes (Addendum)
Pt brought to Cath lab holding, [place], report received from [place], connected to monitor, VS obtained, pt's son at bedside, denies pain, O2 at 6L [place] remains in place, safety maintained, PIV's X3 checked by Hansel Starling, RN and IV NS placed KVO, RUE arm sling noted, repositioned and tightened

## 2021-11-30 NOTE — Progress Notes (Signed)
PT Cancellation Note  Patient Details Name: Ronnie Snyder MRN: [id number] DOB: 1941/01/31   Cancelled Treatment:    Reason Eval/Treat Not Completed: Patient at procedure or test/unavailable (pt at cardiac cath. Will follow.)  Philomena Doheny PT 11/30/2021  Acute Rehabilitation Services  Office [phone]

## 2021-11-30 NOTE — Progress Notes (Signed)
Site area:right groin  Site Prior to Removal:  Level  0 Pressure Applied For:20 minutes Manual:   yes Patient Status During Pull:  awake Post Pull Site:  Level 0 Post Pull Instructions Given:  yes Post Pull Pulses Present: doppler rt pt Dressing Applied:  yes Bedrest begins @  11:30 Comments: arterial and venous

## 2021-11-30 NOTE — Progress Notes (Signed)
   Called and spoke with his son this am - the hospitalist was apparently paged saying there was confusion about the plan for cath today. I have discussed this plan at length with him all week and with his sisters yesterday. His son understands the need for cath and is supportive of this procedure which is scheduled today at [facility]. Will make further recommendations based on these findings.  Pixie Casino, MD, [facility], [place] Director of the Advanced Lipid Disorders &  Cardiovascular Risk Reduction Clinic Diplomate of the American Board of Clinical Lipidology Attending Cardiologist  Direct Dial: [phone]  Fax: [phone]  Website:  [email or web address]

## 2021-11-30 NOTE — Progress Notes (Signed)
Discussed with Dr. Ali Lowe of structural heart clinic, patient is currently scheduled to see Dr. Ali Lowe as outpatient consult on Tuesday 7/18 at 11:00 AM, however if he is still here on Monday, structural heart service can see him as inpatient.

## 2021-11-30 NOTE — Progress Notes (Signed)
   Progress Note   Date: 11/27/2021  Patient Name: Ronnie Snyder        MRN#: [id number]   Clarification of the diagnosis of pressure ulcer(s):   pressure injury stage 1 WAS present on admission

## 2021-11-30 NOTE — Progress Notes (Signed)
PROGRESS NOTE    Ronnie Snyder  GEX:[id number] DOB: September 10, 1940 DOA: 11/25/2021 PCP: Luetta Nutting, DO   Brief Narrative: PCCM pickup 11/28/2021, he was admitted 11/25/2021.  81 year old male admitted 2 days ago after a fall at home.  It appears that he had a syncopal event of unclear etiology.  He was found to be hypercapnic and was placed on BiPAP.  Patient suffered a right proximal humerus fracture with a fall. His past medical history significant for hypertension hypothyroidism hyperlipidemia AAA GERD PAD and stroke. Patient had acute urinary retention Foley catheter placed Echo EF 50 to 01% grade 2 diastolic dysfunction and severe aortic stenosis. Urine strep pneumo and Legionella negative  Assessment & Plan:   Principal Problem:   Acute respiratory failure with hypoxia and hypercapnia (HCC) Active Problems:   AAA (abdominal aortic aneurysm) (HCC)   PAD (peripheral artery disease) (HCC)   Pressure injury of skin   Aortic stenosis  #1 acute hypoxic hypercapnic respiratory failure due to community-acquired pneumonia and COPD exacerbation treated with BiPAP now on nasal cannula CT angiogram negative for pulmonary embolism. Continue BiPAP as needed. Check ambulatory oxygen saturation.  He did not have oxygen prior to admission to hospital. He continues to smoke some. He is being treated with Rocephin and azithromycin and Lasix he had bilateral pleural effusions. 7/13 holding diuretics till after cath in am Creatinine stable at 0.35 will defer to cardiology to restart Lasix.  #2 severe aortic stenosis with grade 2 diastolic dysfunction and EF of 50 to 55%  Status postcardiac cath 11/30/2021 use calcific CAD which does not appear obstructive.  Small PDA with a severe lesion but this would not be a target for revascularization.  Severe aortic stenosis.  Cardiology has arranged for patient to be seen by Dr. Milas Hock as an outpatient on 12/04/2021 at 11 AM.  If patient is still here  on Monday they will see patient as inpatient. Cardiology recommends aspirin daily.  #3 displaced right humeral neck fracture status post fall Ortho was consulted Continue right upper extremity sling No immediate plans for surgery noted  #4 acute urinary retention placed Foley catheter continue Flomax Urecholine added for spasm Holding heparin as patient had hematuria  #5 hypothyroidism on Synthroid  #6 hypertension pressure stable 128/65  #7 AAA history of peripheral artery disease on aspirin and statin   Pressure Injury 11/25/21 Sacrum Medial Stage 1 -  Intact skin with non-blanchable redness of a localized area usually over a bony prominence. (Active)  11/25/21 2230  Location: Sacrum  Location Orientation: Medial  Staging: Stage 1 -  Intact skin with non-blanchable redness of a localized area usually over a bony prominence.  Wound Description (Comments):   Present on Admission: Yes  Dressing Type Foam - Lift dressing to assess site every shift 11/29/21 1930     Estimated body mass index is 16.26 kg/m as calculated from the following:   Height as of this encounter: '5\' 7"'$  (1.702 m).   Weight as of this encounter: 47.1 kg.  DVT prophylaxis: SCD Code Status: Full code Family Communication: None at bedside Disposition Plan:  Status is: Inpatient Remains inpatient appropriate because: IV antibiotics COPD exacerbation pneumonia   Consultants:  PCCM and cardiology and Ortho  Procedures: Foley catheter placed 11/27/2021 Antimicrobials: Rocephin azithromycin  Subjective: I saw him before cath He had no symptoms or complaints today Cath results noted Objective: Vitals:   11/30/21 1355 11/30/21 1400 11/30/21 1408 11/30/21 1459  BP: (!) 101/52  Pulse: 79 80  81  Resp: (!) 22 (!) 25  (!) 24  Temp: 98.1 F (36.7 C)  98.1 F (36.7 C)   TempSrc: Oral  Oral   SpO2: (!) 86% 91%  92%  Weight:      Height:        Intake/Output Summary (Last 24 hours) at 11/30/2021  1601 Last data filed at 11/30/2021 1429 Gross per 24 hour  Intake 106 ml  Output 2700 ml  Net -2594 ml    Filed Weights   11/28/21 0500 11/29/21 0500 11/30/21 0556  Weight: 47.5 kg 47 kg 47.1 kg    Examination:  General exam: Appears in nad Respiratory system: Diminished to auscultation. Respiratory effort normal. Cardiovascular system: S1 & S2 heard, RRR. No JVD, murmurs, rubs, gallops or clicks. No pedal edema. Gastrointestinal system: Abdomen is nondistended, soft and nontender. No organomegaly or masses felt. Normal bowel sounds heard. Central nervous system: Alert and oriented. No focal neurological deficits. Extremities: 1+ pitting edema Skin: No rashes, lesions or ulcers Psychiatry: Judgement and insight appear normal. Mood & affect appropriate.     Data Reviewed: I have personally reviewed following labs and imaging studies  CBC: Recent Labs  Lab 11/25/21 1740 11/25/21 1742 11/26/21 0303 11/26/21 2208 11/27/21 0157 11/28/21 0425 11/30/21 0423 11/30/21 0950 11/30/21 1003 11/30/21 1416  WBC 17.6*  --  20.9*  --  15.6* 12.5* 11.1*  --   --  12.6*  NEUTROABS 15.8*  --   --   --   --   --   --   --   --   --   HGB 11.0*   < > 11.3*   < > 10.2* 10.3* 10.5* 9.9* 9.9*  10.2* 10.4*  HCT 34.7*   < > 34.9*   < > 31.7* 32.7* 32.6* 29.0* 29.0*  30.0* 31.2*  MCV 107.1*  --  105.4*  --  106.0* 106.5* 104.2*  --   --  101.3*  PLT 242  --  240  --  232 201 185  --   --  194   < > = values in this interval not displayed.    Basic Metabolic Panel: Recent Labs  Lab 11/25/21 1740 11/25/21 1742 11/26/21 0802 11/27/21 0157 11/28/21 0425 11/30/21 0423 11/30/21 0950 11/30/21 1003 11/30/21 1416  NA 139 134* 140 140 136 134* 130* 131*  131*  --   K 4.7 4.7 3.3* 3.2* 4.6 4.2 4.1 4.1  4.1  --   CL 97* 93* 96* 97* 94* 91*  --   --   --   CO2 32  --  32 34* 34* 38*  --   --   --   GLUCOSE 181* 180* 120* 118* 95 109*  --   --   --   BUN 32* 37* 26* 25* 20 9  --   --   --    CREATININE 0.62 0.60* 0.47* 0.56* <0.30* 0.33*  --   --  0.35*  CALCIUM 9.0  --  8.9 8.6* 8.5* 8.5*  --   --   --   MG  --   --   --  1.5* 1.8  --   --   --   --   PHOS  --   --   --  2.8 3.1  --   --   --   --     GFR: Estimated Creatinine Clearance: 49.1 mL/min (A) (by C-G formula based on SCr of  0.35 mg/dL (L)). Liver Function Tests: Recent Labs  Lab 11/25/21 1740 11/30/21 0423  AST 118* 30  ALT 100* 58*  ALKPHOS 87 73  BILITOT 1.0 0.5  PROT 6.4* 5.4*  ALBUMIN 3.3* 2.6*    No results for input(s): "LIPASE", "AMYLASE" in the last 168 hours. No results for input(s): "AMMONIA" in the last 168 hours. Coagulation Profile: Recent Labs  Lab 11/25/21 2313  INR 1.3*    Cardiac Enzymes: No results for input(s): "CKTOTAL", "CKMB", "CKMBINDEX", "TROPONINI" in the last 168 hours. BNP (last 3 results) No results for input(s): "PROBNP" in the last 8760 hours. HbA1C: No results for input(s): "HGBA1C" in the last 72 hours. CBG: No results for input(s): "GLUCAP" in the last 168 hours. Lipid Profile: No results for input(s): "CHOL", "HDL", "LDLCALC", "TRIG", "CHOLHDL", "LDLDIRECT" in the last 72 hours.  Thyroid Function Tests: No results for input(s): "TSH", "T4TOTAL", "FREET4", "T3FREE", "THYROIDAB" in the last 72 hours.  Anemia Panel: No results for input(s): "VITAMINB12", "FOLATE", "FERRITIN", "TIBC", "IRON", "RETICCTPCT" in the last 72 hours. Sepsis Labs: Recent Labs  Lab 11/25/21 2027 11/25/21 2313 11/26/21 0303  LATICACIDVEN 7.9* 2.6* 1.1     Recent Results (from the past 240 hour(s))  Blood culture (routine x 2)     Status: Abnormal   Collection Time: 11/25/21  6:09 PM   Specimen: BLOOD  Result Value Ref Range Status   Specimen Description   Final    BLOOD RIGHT ANTECUBITAL Performed at [facility] [address], [place], [place] [address]    Special Requests   Final    BOTTLES DRAWN AEROBIC AND ANAEROBIC Blood Culture adequate  volume Performed at [facility] [address], [place], [place] [address]    Culture  Setup Time   Final    GRAM POSITIVE COCCI IN CLUSTERS AEROBIC BOTTLE ONLY CRITICAL RESULT CALLED TO, READ BACK BY AND VERIFIED WITH: PHARMD JIGNA GADHIA ON 11/26/21 @ 1717 BY DRT    Culture (A)  Final    STAPHYLOCOCCUS EPIDERMIDIS THE SIGNIFICANCE OF ISOLATING THIS ORGANISM FROM A SINGLE SET OF BLOOD CULTURES WHEN MULTIPLE SETS ARE DRAWN IS UNCERTAIN. PLEASE NOTIFY THE MICROBIOLOGY DEPARTMENT WITHIN ONE WEEK IF SPECIATION AND SENSITIVITIES ARE REQUIRED. Performed at [facility], [place] [address], [place], [place] [address]    Report Status 11/27/2021 FINAL  Final  Blood Culture ID Panel (Reflexed)     Status: Abnormal   Collection Time: 11/25/21  6:09 PM  Result Value Ref Range Status   Enterococcus faecalis NOT DETECTED NOT DETECTED Final   Enterococcus Faecium NOT DETECTED NOT DETECTED Final   Listeria monocytogenes NOT DETECTED NOT DETECTED Final   Staphylococcus species DETECTED (A) NOT DETECTED Final    Comment: CRITICAL RESULT CALLED TO, READ BACK BY AND VERIFIED WITH: PHARMD JIGNA GADHIA ON 11/26/21 @ 1717 BY DRT    Staphylococcus aureus (BCID) NOT DETECTED NOT DETECTED Final   Staphylococcus epidermidis DETECTED (A) NOT DETECTED Final    Comment: CRITICAL RESULT CALLED TO, READ BACK BY AND VERIFIED WITH: PHARMD JIGNA GADHIA ON 11/26/21 @ 1717 BY DRT    Staphylococcus lugdunensis NOT DETECTED NOT DETECTED Final   Streptococcus species NOT DETECTED NOT DETECTED Final   Streptococcus agalactiae NOT DETECTED NOT DETECTED Final   Streptococcus pneumoniae NOT DETECTED NOT DETECTED Final   Streptococcus pyogenes NOT DETECTED NOT DETECTED Final   A.calcoaceticus-baumannii NOT DETECTED NOT DETECTED Final   Bacteroides fragilis NOT DETECTED NOT DETECTED Final   Enterobacterales  NOT DETECTED NOT DETECTED Final   Enterobacter cloacae complex NOT DETECTED NOT DETECTED Final    Escherichia coli NOT DETECTED NOT DETECTED Final   Klebsiella aerogenes NOT DETECTED NOT DETECTED Final   Klebsiella oxytoca NOT DETECTED NOT DETECTED Final   Klebsiella pneumoniae NOT DETECTED NOT DETECTED Final   Proteus species NOT DETECTED NOT DETECTED Final   Salmonella species NOT DETECTED NOT DETECTED Final   Serratia marcescens NOT DETECTED NOT DETECTED Final   Haemophilus influenzae NOT DETECTED NOT DETECTED Final   Neisseria meningitidis NOT DETECTED NOT DETECTED Final   Pseudomonas aeruginosa NOT DETECTED NOT DETECTED Final   Stenotrophomonas maltophilia NOT DETECTED NOT DETECTED Final   Candida albicans NOT DETECTED NOT DETECTED Final   Candida auris NOT DETECTED NOT DETECTED Final   Candida glabrata NOT DETECTED NOT DETECTED Final   Candida krusei NOT DETECTED NOT DETECTED Final   Candida parapsilosis NOT DETECTED NOT DETECTED Final   Candida tropicalis NOT DETECTED NOT DETECTED Final   Cryptococcus neoformans/gattii NOT DETECTED NOT DETECTED Final   Methicillin resistance mecA/C NOT DETECTED NOT DETECTED Final    Comment: Performed at [facility], [place] [address], [place], [place] [address]  Blood culture (routine x 2)     Status: None (Preliminary result)   Collection Time: 11/25/21  6:14 PM   Specimen: BLOOD  Result Value Ref Range Status   Specimen Description   Final    BLOOD LEFT ANTECUBITAL Performed at [facility] [address] [place], [place] [address]    Special Requests   Final    BOTTLES DRAWN AEROBIC AND ANAEROBIC Blood Culture results may not be optimal due to an inadequate volume of blood received in culture bottles Performed at [facility] [address], [place], [place] [address]    Culture   Final    NO GROWTH 4 DAYS Performed at [facility], [place] [address] [place],  [address]    Report Status PENDING  Incomplete  SARS Coronavirus 2 by RT PCR (hospital order, performed in [facility]  hospital lab) *cepheid single result test* Anterior Nasal Swab     Status: None   Collection Time: 11/25/21  7:53 PM   Specimen: Anterior Nasal Swab  Result Value Ref Range Status   SARS Coronavirus 2 by RT PCR NEGATIVE NEGATIVE Final    Comment: (NOTE) SARS-CoV-2 target nucleic acids are NOT DETECTED.  The SARS-CoV-2 RNA is generally detectable in upper and lower respiratory specimens during the acute phase of infection. The lowest concentration of SARS-CoV-2 viral copies this assay can detect is 250 copies / mL. A negative result does not preclude SARS-CoV-2 infection and should not be used as the sole basis for treatment or other patient management decisions.  A negative result may occur with improper specimen collection / handling, submission of specimen other than nasopharyngeal swab, presence of viral mutation(s) within the areas targeted by this assay, and inadequate number of viral copies (<250 copies / mL). A negative result must be combined with clinical observations, patient history, and epidemiological information.  Fact Sheet for Patients:   https://www.patel.info/  Fact Sheet for Healthcare Providers: https://hall.com/  This test is not yet approved or  cleared by the [place] FDA and has been authorized for detection and/or diagnosis of SARS-CoV-2 by FDA under an Emergency Use Authorization (EUA).  This EUA will remain in effect (meaning this test can be used) for the duration of the COVID-19 declaration under Section  564(b)(1) of the Act, 21 U.S.C. section 360bbb-3(b)(1), unless the authorization is terminated or revoked sooner.  Performed at [facility], [place] [address], [place], [place] [address]   Respiratory (~20 pathogens) panel by PCR     Status: None   Collection Time: 11/25/21  7:54 PM   Specimen: Anterior Nasal Swab; Respiratory  Result Value Ref Range Status   Adenovirus NOT DETECTED  NOT DETECTED Final   Coronavirus 229E NOT DETECTED NOT DETECTED Final    Comment: (NOTE) The Coronavirus on the Respiratory Panel, DOES NOT test for the novel  Coronavirus (2019 nCoV)    Coronavirus HKU1 NOT DETECTED NOT DETECTED Final   Coronavirus NL63 NOT DETECTED NOT DETECTED Final   Coronavirus OC43 NOT DETECTED NOT DETECTED Final   Metapneumovirus NOT DETECTED NOT DETECTED Final   Rhinovirus / Enterovirus NOT DETECTED NOT DETECTED Final   Influenza A NOT DETECTED NOT DETECTED Final   Influenza B NOT DETECTED NOT DETECTED Final   Parainfluenza Virus 1 NOT DETECTED NOT DETECTED Final   Parainfluenza Virus 2 NOT DETECTED NOT DETECTED Final   Parainfluenza Virus 3 NOT DETECTED NOT DETECTED Final   Parainfluenza Virus 4 NOT DETECTED NOT DETECTED Final   Respiratory Syncytial Virus NOT DETECTED NOT DETECTED Final   Bordetella pertussis NOT DETECTED NOT DETECTED Final   Bordetella Parapertussis NOT DETECTED NOT DETECTED Final   Chlamydophila pneumoniae NOT DETECTED NOT DETECTED Final   Mycoplasma pneumoniae NOT DETECTED NOT DETECTED Final    Comment: Performed at [facility], [place]. [address] [place], [place] [address]  MRSA Next Gen by PCR, Nasal     Status: None   Collection Time: 11/25/21 10:14 PM   Specimen: Nasal Mucosa; Nasal Swab  Result Value Ref Range Status   MRSA by PCR Next Gen NOT DETECTED NOT DETECTED Final    Comment: (NOTE) The GeneXpert MRSA Assay (FDA approved for NASAL specimens only), is one component of a comprehensive MRSA colonization surveillance program. It is not intended to diagnose MRSA infection nor to guide or monitor treatment for MRSA infections. Test performance is not FDA approved in patients less than 62 years old. Performed at [facility], [place] [address] [place], [place] [address]          Radiology Studies: CARDIAC CATHETERIZATION  Addendum Date: 11/30/2021     2nd Diag lesion is 50% stenosed.   RPDA lesion  is 80% stenosed.   Dist LM lesion is 25% stenosed.   The left ventricular systolic function is normal.   LV end diastolic pressure is normal.   The left ventricular ejection fraction is 55-65% by visual estimate.   There is severe aortic valve stenosis.  Calculated aortic valve area 0.93 cm2.  Mean gradient 30 mmHg   Aortic saturation 98%, PA saturation 69%, PA pressure 30/7, mean PA pressure 15 mmHg, mean pulmonary capillary wedge pressure 2 mmHg, cardiac output 4.91 L/min, cardiac index 3.21. Severely calcified right common femoral and iliac system.  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 Diffuse, calcific coronary artery disease which does not appear obstructive.  There is a small PDA with a severe lesion but this would not be a target for revascularization. Severe aortic stenosis by calculated valve area.  Further plans per the TAVR team.  Result Date: 11/30/2021   2nd Diag lesion is 50% stenosed.   RPDA lesion is 80% stenosed.   Dist LM lesion is 25% stenosed.   The left  ventricular systolic function is normal.   LV end diastolic pressure is normal.   The left ventricular ejection fraction is 55-65% by visual estimate.   There is severe aortic valve stenosis.  Calculated aortic valve area 0.93 cm2.  Mean gradient 30 mmHg   Aortic saturation 98%, PA saturation 69%, PA pressure 30/7, mean PA pressure 15 mmHg, mean pulmonary capillary wedge pressure 2 mmHg, cardiac output 4.91 L/min, cardiac index 3.21. Severely calcified right common femoral and iliac system.  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 Diffuse, calcific coronary artery disease which does not appear obstructive.  There is a small PDA with a severe lesion but this would not be a target for revascularization. Severe aortic stenosis by calculated valve area.  Further plans per the TAVR team.        Scheduled Meds:  aspirin EC  81 mg Oral Daily   bethanechol  5 mg Oral TID   Chlorhexidine Gluconate  Cloth  6 each Topical Daily   folic acid  1 mg Oral Daily   heparin  5,000 Units Subcutaneous Q8H   ipratropium-albuterol  3 mL Nebulization BID   levothyroxine  75 mcg Oral Q0600   lidocaine  1 patch Transdermal Q24H   mouth rinse  15 mL Mouth Rinse 4 times per day   rosuvastatin  20 mg Oral Daily   sodium chloride flush  3 mL Intravenous Q12H   tamsulosin  0.4 mg Oral Daily   Continuous Infusions:  sodium chloride Stopped (11/27/21 0746)   sodium chloride     azithromycin (ZITHROMAX) 500 mg in sodium chloride 0.9 % 250 mL IVPB 500 mg (11/29/21 2135)   cefTRIAXone (ROCEPHIN)  IV 2 g (11/29/21 2026)     LOS: 5 days    Time spent: 35 minutes  Georgette Shell, MD 11/30/2021, 4:01 PM

## 2021-12-01 DIAGNOSIS — I714 Abdominal aortic aneurysm, without rupture, unspecified: Secondary | ICD-10-CM | POA: Diagnosis not present

## 2021-12-01 DIAGNOSIS — J9602 Acute respiratory failure with hypercapnia: Secondary | ICD-10-CM | POA: Diagnosis not present

## 2021-12-01 DIAGNOSIS — I739 Peripheral vascular disease, unspecified: Secondary | ICD-10-CM | POA: Diagnosis not present

## 2021-12-01 DIAGNOSIS — I350 Nonrheumatic aortic (valve) stenosis: Secondary | ICD-10-CM | POA: Diagnosis not present

## 2021-12-01 DIAGNOSIS — J9601 Acute respiratory failure with hypoxia: Secondary | ICD-10-CM | POA: Diagnosis not present

## 2021-12-01 DIAGNOSIS — I25118 Atherosclerotic heart disease of native coronary artery with other forms of angina pectoris: Secondary | ICD-10-CM

## 2021-12-01 LAB — FERRITIN: Ferritin: 517 ng/mL — ABNORMAL HIGH (ref 24–336)

## 2021-12-01 LAB — VITAMIN B12: Vitamin B-12: 673 pg/mL (ref 180–914)

## 2021-12-01 LAB — IRON AND TIBC
Iron: 48 ug/dL (ref 45–182)
Saturation Ratios: 28 % (ref 17.9–39.5)
TIBC: 174 ug/dL — ABNORMAL LOW (ref 250–450)
UIBC: 126 ug/dL

## 2021-12-01 LAB — FOLATE: Folate: 24.4 ng/mL (ref >5.9)

## 2021-12-01 LAB — CULTURE, BLOOD (ROUTINE X 2): Culture: NO GROWTH

## 2021-12-01 LAB — RETICULOCYTES
Immature Retic Fract: 6.6 % (ref 2.3–15.9)
RBC.: 2.69 MIL/uL — ABNORMAL LOW (ref 4.22–5.81)
Retic Count, Absolute: 55.1 10*3/uL (ref 19.0–186.0)
Retic Ct Pct: 2.1 % (ref 0.4–3.1)

## 2021-12-01 MED ORDER — MENTHOL 3 MG MT LOZG
1.0000 | LOZENGE | OROMUCOSAL | Status: DC | PRN
Start: 2021-12-01 — End: 2021-12-02
  Filled 2021-12-01: qty 9

## 2021-12-01 MED ORDER — OXYCODONE HCL 5 MG PO TABS
5.0000 mg | ORAL_TABLET | ORAL | Status: DC | PRN
  Administered 2021-12-01 – 2021-12-03 (×7): 5 mg via ORAL
  Filled 2021-12-01 (×7): qty 1

## 2021-12-01 NOTE — Progress Notes (Signed)
[facility]                                                                               Ronnie Snyder, is a 81 y.o. male, DOB - 02/14/1941, JXB:[id number] Admit date - 11/25/2021    Outpatient Primary MD for the patient is Luetta Nutting, DO  LOS - 6  days    Brief summary   81 year old gentleman with piror h/o GERD, PAD, AAA, hypertension. Hypothyroidism, hyperlipidemia, stroke. Diastolic CHF, presented  to ED after a fall at home with syncope,and sob. He was admitted for acute respiratory failure with hypoxia and right proximal humerus fracture..    Assessment & Plan    Assessment and Plan:  Acute respiratory failure with hypoxia and hypercapnia probably secondary to a combination of CAP and COPD exacerbation.  Required BIPAP initially and transitioned to [place] oxygen. CTA neg for PE.  Started the patient on IV rocephin and zithromax, complete the course of rocephin - 7 days.  Urine strep and legionella antigen negative.  Plan to wean him off oxygen on discharge.  Check ambulating oxygen levels with PT prior to discharge. With his h/o copd and continuous smoking, he might need oxygen on discharge.  No wheezing heard on exam today.  Leukocytosis 12,600.   Severe aortic stenosis with diastolic CHF. Echo showed preserved LVEF with a grade 2 diastolic dysfunction.  S/p cath on 11/30/21, results reviewed with the patient.  Recommend outpatient follow up with cardiology/ TAVR team on 7/18.  Continue with aspirin and statin.   Displaced right humeral neck fracture status post fall  Ortho consulted, no immediate plans for surgery.  Continue with RUE sling.    Hypertension:  Well controlled.    Hyperlipidemia:  Continue with crestor.    Hypothyroidism:  Continue with synthroid 75 mcg daily.  Tsh wnl.    Urinary Retention:  S/p foley catheter, will do voiding trial today.  Pt was started on flomax  and Urecholine daily.    AAA, / PAD  Continue with  aspirin and statin.   Therapy evaluations ordered and pending.  Check ambulating oxygen levels with PT.     Hyponatremia:  ? Fluid overload. Monitor.    Macrocytic anemia:  Will check anemia panel.        RN Pressure Injury Documentation: Pressure Injury 11/25/21 Sacrum Medial Stage 1 -  Intact skin with non-blanchable redness of a localized area usually over a bony prominence. (Active)  11/25/21 2230  Location: Sacrum  Location Orientation: Medial  Staging: Stage 1 -  Intact skin with non-blanchable redness of a localized area usually over a bony prominence.  Wound Description (Comments):   Present on Admission: Yes  Dressing Type Foam - Lift dressing to assess site every shift 11/29/21 1930   Present on admission. Local wound care.      Estimated body mass index is 16.23 kg/m as calculated from the following:   Height as of this encounter: '5\' 7"'$  (1.702 m).   Weight as of this encounter: 47 kg.  Code Status: full code.  DVT Prophylaxis:  heparin injection 5,000 Units Start: 11/30/21 2200 SCDs Start: 11/25/21 2028   Level  of Care: Level of care: Progressive Cardiac Family Communication: none at bedside.   Disposition Plan:     Remains inpatient appropriate:  IV antibiotics   Procedures:  Cardiac cath on 7/14.  2nd Diag lesion is 50% stenosed.   RPDA lesion is 80% stenosed.   Dist LM lesion is 25% stenosed.   The left ventricular systolic function is normal.   LV end diastolic pressure is normal.   The left ventricular ejection fraction is 55-65% by visual estimate.   There is severe aortic valve stenosis.  Calculated aortic valve area 0.93 cm2.  Mean gradient 30 mmHg   Aortic saturation 98%, PA saturation 69%, PA pressure 30/7, mean PA pressure 15 mmHg, mean pulmonary capillary wedge pressure 2 mmHg, cardiac output 4.91 L/min, cardiac index 3.21.   Severely calcified right common femoral and iliac system.  There appears to be a left common femoral aneurysm  on exam.  Diffuse, calcific coronary artery disease which does not appear obstructive.  There is a small PDA with a severe lesion but this would not be a target for revascularization.   Severe aortic stenosis by calculated valve area.  Further plans per the TAVR team.    Consultants:   PCCM.  Cardiology.   Antimicrobials:   Anti-infectives (From admission, onward)    Start     Dose/Rate Route Frequency Ordered Stop   11/26/21 2100  azithromycin (ZITHROMAX) 500 mg in sodium chloride 0.9 % 250 mL IVPB  Status:  Discontinued        500 mg 250 mL/hr over 60 Minutes Intravenous Every 24 hours 11/25/21 2032 12/01/21 1113   11/26/21 2000  cefTRIAXone (ROCEPHIN) 2 g in sodium chloride 0.9 % 100 mL IVPB        2 g 200 mL/hr over 30 Minutes Intravenous Every 24 hours 11/25/21 2032 12/01/21 2359   11/25/21 1900  cefTRIAXone (ROCEPHIN) 1 g in sodium chloride 0.9 % 100 mL IVPB        1 g 200 mL/hr over 30 Minutes Intravenous  Once 11/25/21 1850 11/25/21 2019   11/25/21 1900  azithromycin (ZITHROMAX) 500 mg in sodium chloride 0.9 % 250 mL IVPB        500 mg 250 mL/hr over 60 Minutes Intravenous  Once 11/25/21 1850 11/25/21 2119        Medications  Scheduled Meds:  aspirin EC  81 mg Oral Daily   bethanechol  5 mg Oral TID   Chlorhexidine Gluconate Cloth  6 each Topical Daily   folic acid  1 mg Oral Daily   heparin  5,000 Units Subcutaneous Q8H   levothyroxine  75 mcg Oral Q0600   lidocaine  1 patch Transdermal Q24H   mouth rinse  15 mL Mouth Rinse 4 times per day   rosuvastatin  20 mg Oral Daily   sodium chloride flush  3 mL Intravenous Q12H   tamsulosin  0.4 mg Oral Daily   Continuous Infusions:  sodium chloride Stopped (11/27/21 0746)   sodium chloride     cefTRIAXone (ROCEPHIN)  IV 2 g (11/30/21 2111)   PRN Meds:.sodium chloride, sodium chloride, acetaminophen, acetaminophen, docusate sodium, fentaNYL (SUBLIMAZE) injection, ipratropium-albuterol, labetalol, ondansetron  (ZOFRAN) IV, mouth rinse, oxyCODONE, polyethylene glycol, sodium chloride flush, traMADol    Subjective:   Ronnie Snyder was seen and examined today. Not feeling good.  No chest pain. Sob improving.  Objective:   Vitals:   12/01/21 0812 12/01/21 0836 12/01/21 1116 12/01/21 1200  BP: (!) 101/52  Marland Kitchen)  144/64   Pulse: 70  66   Resp: 20  20   Temp: 97.8 F (36.6 C)  97.8 F (36.6 C)   TempSrc: Oral  Axillary   SpO2: 100% 100% 98% 94%  Weight:      Height:        Intake/Output Summary (Last 24 hours) at 12/01/2021 1706 Last data filed at 12/01/2021 1258 Gross per 24 hour  Intake --  Output 800 ml  Net -800 ml   Filed Weights   11/29/21 0500 11/30/21 0556 12/01/21 0500  Weight: 47 kg 47.1 kg 47 kg     Exam General exam: Appears calm and comfortable  Respiratory system: Clear to auscultation. Respiratory effort normal. On 2 lit of [place] oxygen.  Cardiovascular system: S1 & S2 heard, RRR. No JVD,  No pedal edema. Gastrointestinal system: Abdomen is nondistended, soft and nontender. Normal bowel sounds heard. Central nervous system: Alert and oriented. No focal neurological deficits. Extremities: Symmetric 5 x 5 power. Skin: No rashes, lesions or ulcers Psychiatry:  Mood & affect appropriate.    Data Reviewed:  I have personally reviewed following labs and imaging studies   CBC Lab Results  Component Value Date   WBC 12.6 (H) 11/30/2021   RBC 3.08 (L) 11/30/2021   HGB 10.4 (L) 11/30/2021   HCT 31.2 (L) 11/30/2021   MCV 101.3 (H) 11/30/2021   MCH 33.8 11/30/2021   PLT 194 11/30/2021   MCHC 33.3 11/30/2021   RDW 14.7 11/30/2021   LYMPHSABS 0.4 (L) 11/25/2021   MONOABS 1.0 11/25/2021   EOSABS 0.0 11/25/2021   BASOSABS 0.0 16/02/9603     Last metabolic panel Lab Results  Component Value Date   NA 131 (L) 11/30/2021   NA 131 (L) 11/30/2021   K 4.1 11/30/2021   K 4.1 11/30/2021   CL 91 (L) 11/30/2021   CO2 38 (H) 11/30/2021   BUN 9 11/30/2021    CREATININE 0.35 (L) 11/30/2021   GLUCOSE 109 (H) 11/30/2021   GFRNONAA >60 11/30/2021   GFRAA 116 05/30/2020   CALCIUM 8.5 (L) 11/30/2021   PHOS 3.1 11/28/2021   PROT 5.4 (L) 11/30/2021   ALBUMIN 2.6 (L) 11/30/2021   BILITOT 0.5 11/30/2021   ALKPHOS 73 11/30/2021   AST 30 11/30/2021   ALT 58 (H) 11/30/2021   ANIONGAP 5 11/30/2021    CBG (last 3)  No results for input(s): "GLUCAP" in the last 72 hours.    Coagulation Profile: Recent Labs  Lab 11/25/21 2313  INR 1.3*     Radiology Studies: CARDIAC CATHETERIZATION  Addendum Date: 11/30/2021     2nd Diag lesion is 50% stenosed.   RPDA lesion is 80% stenosed.   Dist LM lesion is 25% stenosed.   The left ventricular systolic function is normal.   LV end diastolic pressure is normal.   The left ventricular ejection fraction is 55-65% by visual estimate.   There is severe aortic valve stenosis.  Calculated aortic valve area 0.93 cm2.  Mean gradient 30 mmHg   Aortic saturation 98%, PA saturation 69%, PA pressure 30/7, mean PA pressure 15 mmHg, mean pulmonary capillary wedge pressure 2 mmHg, cardiac output 4.91 L/min, cardiac index 3.21. Severely calcified right common femoral and iliac system.  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 Diffuse, calcific coronary artery disease which does not appear obstructive.  There is a small PDA with a severe lesion but this would not be a target for revascularization. Severe aortic stenosis  by calculated valve area.  Further plans per the TAVR team.  Result Date: 11/30/2021   2nd Diag lesion is 50% stenosed.   RPDA lesion is 80% stenosed.   Dist LM lesion is 25% stenosed.   The left ventricular systolic function is normal.   LV end diastolic pressure is normal.   The left ventricular ejection fraction is 55-65% by visual estimate.   There is severe aortic valve stenosis.  Calculated aortic valve area 0.93 cm2.  Mean gradient 30 mmHg   Aortic saturation 98%, PA saturation 69%, PA pressure  30/7, mean PA pressure 15 mmHg, mean pulmonary capillary wedge pressure 2 mmHg, cardiac output 4.91 L/min, cardiac index 3.21. Severely calcified right common femoral and iliac system.  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 Diffuse, calcific coronary artery disease which does not appear obstructive.  There is a small PDA with a severe lesion but this would not be a target for revascularization. Severe aortic stenosis by calculated valve area.  Further plans per the TAVR team.       Hosie Poisson M.D. [facility] 12/01/2021, 5:06 PM  Available via Epic secure chat 7am-7pm After 7 pm, please refer to night coverage provider listed on amion.

## 2021-12-01 NOTE — Progress Notes (Signed)
Progress Note  Patient Name: Ronnie Snyder Date of Encounter: 12/01/2021  [facility] Cardiologist: None   Subjective   Feels okay this morning. No chest pain or SOB. Right groin access site c/d/i  Inpatient Medications    Scheduled Meds:  aspirin EC  81 mg Oral Daily   bethanechol  5 mg Oral TID   Chlorhexidine Gluconate Cloth  6 each Topical Daily   folic acid  1 mg Oral Daily   heparin  5,000 Units Subcutaneous Q8H   ipratropium-albuterol  3 mL Nebulization BID   levothyroxine  75 mcg Oral Q0600   lidocaine  1 patch Transdermal Q24H   mouth rinse  15 mL Mouth Rinse 4 times per day   rosuvastatin  20 mg Oral Daily   sodium chloride flush  3 mL Intravenous Q12H   tamsulosin  0.4 mg Oral Daily   Continuous Infusions:  sodium chloride Stopped (11/27/21 0746)   sodium chloride     azithromycin (ZITHROMAX) 500 mg in sodium chloride 0.9 % 250 mL IVPB 500 mg (11/30/21 2216)   cefTRIAXone (ROCEPHIN)  IV 2 g (11/30/21 2111)   PRN Meds: sodium chloride, sodium chloride, acetaminophen, acetaminophen, docusate sodium, fentaNYL (SUBLIMAZE) injection, ipratropium-albuterol, labetalol, ondansetron (ZOFRAN) IV, mouth rinse, oxyCODONE, polyethylene glycol, sodium chloride flush, traMADol   Vital Signs    Vitals:   12/01/21 0036 12/01/21 0421 12/01/21 0500 12/01/21 0812  BP: 120/67 117/68  (!) 101/52  Pulse: 65 67  70  Resp: '18 20 11 20  '$ Temp: 97.8 F (36.6 C) 98.5 F (36.9 C)  97.8 F (36.6 C)  TempSrc: Oral Oral  Oral  SpO2: 100% 99%  100%  Weight:   47 kg   Height:        Intake/Output Summary (Last 24 hours) at 12/01/2021 0834 Last data filed at 11/30/2021 1947 Gross per 24 hour  Intake 6 ml  Output 1700 ml  Net -1694 ml      12/01/2021    5:00 AM 11/30/2021    5:56 AM 11/29/2021    5:00 AM  Last 3 Weights  Weight (lbs) 103 lb 9.9 oz 103 lb 13.4 oz 103 lb 9.9 oz  Weight (kg) 47 kg 47.1 kg 47 kg      Telemetry    NSR with occasional PVCs - Personally  Reviewed  ECG    No new tracing - Personally Reviewed  Physical Exam   GEN: Elderly male, frail appearing, NAD  Neck: No JVD Cardiac: RRR, 3/6 harsh systolic murmur heard throughout the precordium Respiratory: Diminished but clear GI: Soft, nontender, non-distended  MS: No edema; No deformity. Right groin access site c/d/i Neuro:  Nonfocal  Psych: Normal affect   Labs    High Sensitivity Troponin:   Recent Labs  Lab 11/25/21 1740 11/25/21 1957 11/25/21 2313 11/26/21 0303  TROPONINIHS 136* 89* 317* 471*     Chemistry Recent Labs  Lab 11/25/21 1740 11/25/21 1742 11/27/21 0157 11/28/21 0425 11/30/21 0423 11/30/21 0950 11/30/21 1003 11/30/21 1416  NA 139   < > 140 136 134* 130* 131*  131*  --   K 4.7   < > 3.2* 4.6 4.2 4.1 4.1  4.1  --   CL 97*   < > 97* 94* 91*  --   --   --   CO2 32   < > 34* 34* 38*  --   --   --   GLUCOSE 181*   < > 118* 95 109*  --   --   --  BUN 32*   < > 25* 20 9  --   --   --   CREATININE 0.62   < > 0.56* <0.30* 0.33*  --   --  0.35*  CALCIUM 9.0   < > 8.6* 8.5* 8.5*  --   --   --   MG  --   --  1.5* 1.8  --   --   --   --   PROT 6.4*  --   --   --  5.4*  --   --   --   ALBUMIN 3.3*  --   --   --  2.6*  --   --   --   AST 118*  --   --   --  30  --   --   --   ALT 100*  --   --   --  58*  --   --   --   ALKPHOS 87  --   --   --  73  --   --   --   BILITOT 1.0  --   --   --  0.5  --   --   --   GFRNONAA >60   < > >60 NOT CALCULATED >60  --   --  >60  ANIONGAP 10   < > '9 8 5  '$ --   --   --    < > = values in this interval not displayed.    Lipids  Recent Labs  Lab 11/26/21 0303  CHOL 102  TRIG 53  HDL 39*  LDLCALC 52  CHOLHDL 2.6    Hematology Recent Labs  Lab 11/28/21 0425 11/30/21 0423 11/30/21 0950 11/30/21 1003 11/30/21 1416  WBC 12.5* 11.1*  --   --  12.6*  RBC 3.07* 3.13*  --   --  3.08*  HGB 10.3* 10.5* 9.9* 9.9*  10.2* 10.4*  HCT 32.7* 32.6* 29.0* 29.0*  30.0* 31.2*  MCV 106.5* 104.2*  --   --  101.3*  MCH  33.6 33.5  --   --  33.8  MCHC 31.5 32.2  --   --  33.3  RDW 14.9 15.1  --   --  14.7  PLT 201 185  --   --  194   Thyroid  Recent Labs  Lab 11/25/21 1925  TSH 1.394    BNP Recent Labs  Lab 11/27/21 0157  BNP 433.1*    DDimer  Recent Labs  Lab 11/25/21 2313  DDIMER 2.81*     Radiology    CARDIAC CATHETERIZATION  Addendum Date: 11/30/2021     2nd Diag lesion is 50% stenosed.   RPDA lesion is 80% stenosed.   Dist LM lesion is 25% stenosed.   The left ventricular systolic function is normal.   LV end diastolic pressure is normal.   The left ventricular ejection fraction is 55-65% by visual estimate.   There is severe aortic valve stenosis.  Calculated aortic valve area 0.93 cm2.  Mean gradient 30 mmHg   Aortic saturation 98%, PA saturation 69%, PA pressure 30/7, mean PA pressure 15 mmHg, mean pulmonary capillary wedge pressure 2 mmHg, cardiac output 4.91 L/min, cardiac index 3.21. Severely calcified right common femoral and iliac system.  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 Diffuse, calcific coronary artery disease which does not appear obstructive.  There is a small PDA with a severe lesion but this would not be a  target for revascularization. Severe aortic stenosis by calculated valve area.  Further plans per the TAVR team.  Result Date: 11/30/2021   2nd Diag lesion is 50% stenosed.   RPDA lesion is 80% stenosed.   Dist LM lesion is 25% stenosed.   The left ventricular systolic function is normal.   LV end diastolic pressure is normal.   The left ventricular ejection fraction is 55-65% by visual estimate.   There is severe aortic valve stenosis.  Calculated aortic valve area 0.93 cm2.  Mean gradient 30 mmHg   Aortic saturation 98%, PA saturation 69%, PA pressure 30/7, mean PA pressure 15 mmHg, mean pulmonary capillary wedge pressure 2 mmHg, cardiac output 4.91 L/min, cardiac index 3.21. Severely calcified right common femoral and iliac system.  There appears to be  a left common femoral aneurysm on exam. Diffuse, calcific coronary artery disease which does not appear obstructive.  There is a small PDA with a severe lesion but this would not be a target for revascularization. Severe aortic stenosis by calculated valve area.  Further plans per the TAVR team.    Cardiac Studies   Cath 11/30/21:   2nd Diag lesion is 50% stenosed.   RPDA lesion is 80% stenosed.   Dist LM lesion is 25% stenosed.   The left ventricular systolic function is normal.   LV end diastolic pressure is normal.   The left ventricular ejection fraction is 55-65% by visual estimate.   There is severe aortic valve stenosis.  Calculated aortic valve area 0.93 cm2.  Mean gradient 30 mmHg   Aortic saturation 98%, PA saturation 69%, PA pressure 30/7, mean PA pressure 15 mmHg, mean pulmonary capillary wedge pressure 2 mmHg, cardiac output 4.91 L/min, cardiac index 3.21.   Severely calcified right common femoral and iliac system.  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  Diffuse, calcific coronary artery disease which does not appear obstructive.  There is a small PDA with a severe lesion but this would not be a target for revascularization.   Severe aortic stenosis by calculated valve area.  Further plans per the TAVR team.  TTE 11/26/21: IMPRESSIONS   1. Left ventricular ejection fraction, by estimation, is 50 to 55%. The  left ventricle has low normal function. The left ventricle has no regional  wall motion abnormalities. There is mild left ventricular hypertrophy.  Left ventricular diastolic  parameters are consistent with Grade II diastolic dysfunction  (pseudonormalization). Elevated left atrial pressure.   2. Right ventricular systolic function is normal. The right ventricular  size is normal. There is moderately elevated pulmonary artery systolic  pressure. The estimated right ventricular systolic pressure is 54.0 mmHg.   3. Left atrial size was moderately  dilated.   4. The mitral valve is degenerative. Mild mitral valve regurgitation.  Mild mitral stenosis. The mean mitral valve gradient is 5.0 mmHg with  average heart rate of 102 bpm. Severe mitral annular calcification.   5. The inferior vena cava is dilated in size with <50% respiratory  variability, suggesting right atrial pressure of 15 mmHg.   6. The aortic valve was not well visualized. There is severe calcifcation  of the aortic valve. Aortic valve regurgitation is not visualized. Severe  aortic valve stenosis. AS moderate by gradients (Vmax 3.6 m/s, MG 31  mmHg), severe by AVA (0.7 cm^2) and  DI (0.21). Low SV index (35 cc/m^2), suspect paradoxical low flow low  gradient severe AS   Patient Profile  81 y.o. male with history of PAD s/p prior intervention of left lower extremity,  AAA, left common iliac artery aneurysm, left common femoral aneurysm, carotid artery disease, stroke, hypertension, hyperlipidemia, hypothyroidism, GERD, and hepatic steatosis who presented with fall and possible presyncope found to be in respiratory failure with sats 30% on arrival to the ED thought to be due to community acquired pneumonia and COPD. TTE here demonstrated severe AS with AVA 0.7cm2, DI 0.21 with mean gradient 83mHg (paradoxical low flow, low gradient AS) for which cardiology was consulted.   Assessment & Plan    #Severe Aortic Stenosis TTE with severe paradoxical AS with AVA (0.7 cm) and DI (0.21) mean gradient 371mg. Cath 11/30/21 confirmed severe AS with AVA 0.9cm2, mean gradient 3094m. Will plan to see structural as outpatient for consideration of TAVR. If remains hospitalized on Monday, structural team can consult as inpatient. Notably, he is very frail which may impact his TAVR candidacy. - Will plan for further evaluation by structural team either as outpatient if discharged over the weekend vs inpatient if remains in the hospital on Monday  #Acute Hypoxemic Respiratory  Failure Patient presented with severe acute hypoxemic respiratory failure with O2 sat initially of 30%.  Initially required BiPAP. Chest CTA negative for PE but was concerning for possible pneumonia.  Also showed small to moderate right and small left pleural effusions. Echo showed LVEF of 50-55% with normal wall motion, mild LVH, grade 2 diastolic dysfunction, and elevated elft atrial pressure. RV was normal with moderately elevated RVSP of 45.9 mmHg. He has been on antibiotics and lasix with significant improvement. RHC with normal filling pressures. -Continue ABX per primary -RHC with normal filling pressures; no further IV diuresis needed   #Elevated Troponin #Diffuse, nonobstructive CAD High-sensitivity troponin elevated at 136 >> 89 >> 317 >> 417. EKG showed no acute ischemic changes. Echo with normal LV function and no regional wall motion abnormalities. Cath with diffuse, non obstructive CAD. Will continue with medical management. -Continue ASA '81mg'$  daily -Continue crestor '20mg'$  daily   #PAD S/p left iliofemoral endarterectomy with patch angioplasty in 01/2011 and then a left femoral popliteal atherectomy with stenting in 09/2012. - Continue aspirin and statin. - Followed by Vascular Surgery as an outpatient.   #AAA #Left Common Iliac Artery Aneurysm #Left Common Femoral Aneurysm CTA this admission showed fusiform aneurysm of the abdominal aorta measuring up to 4.1 x 3.9 cm, aneurysm of the left common iliac artery measuring up to 1.9 x 1.9 cm, and an aneurysm or pseudoaneurysm of the left common femoral artery measuring up to 3.2 x 2.9 cm. All aneurysmal findings are unchanged compared to prior imaging. - Followed by Vascular Surgery as an outpatient.   #Hypertension Controlled. Not on medications.   #Tobacco Abuse Patient has a long smoking history and continues to smoke about <10 cigarettes per day. - Cessation recommended  #Right Humeral Fracture: No immediate plans for  surgery. In a right arm sling.  -Management per primary and ortho    For questions or updates, please contact [email or web address] consult [email or web address] for contact info under        Signed, HeaFreada BergeronD  12/01/2021, 8:34 AM

## 2021-12-02 DIAGNOSIS — I714 Abdominal aortic aneurysm, without rupture, unspecified: Secondary | ICD-10-CM | POA: Diagnosis not present

## 2021-12-02 DIAGNOSIS — I739 Peripheral vascular disease, unspecified: Secondary | ICD-10-CM | POA: Diagnosis not present

## 2021-12-02 DIAGNOSIS — J9602 Acute respiratory failure with hypercapnia: Secondary | ICD-10-CM | POA: Diagnosis not present

## 2021-12-02 DIAGNOSIS — J9601 Acute respiratory failure with hypoxia: Secondary | ICD-10-CM | POA: Diagnosis not present

## 2021-12-02 LAB — CBC WITH DIFFERENTIAL/PLATELET
Abs Immature Granulocytes: 0.63 10*3/uL — ABNORMAL HIGH (ref 0.00–0.07)
Basophils Absolute: 0.1 10*3/uL (ref 0.0–0.1)
Basophils Relative: 1 %
Eosinophils Absolute: 0.3 10*3/uL (ref 0.0–0.5)
Eosinophils Relative: 3 %
HCT: 28.9 % — ABNORMAL LOW (ref 39.0–52.0)
Hemoglobin: 9.8 g/dL — ABNORMAL LOW (ref 13.0–17.0)
Immature Granulocytes: 5 %
Lymphocytes Relative: 5 %
Lymphs Abs: 0.6 10*3/uL — ABNORMAL LOW (ref 0.7–4.0)
MCH: 33.7 pg (ref 26.0–34.0)
MCHC: 33.9 g/dL (ref 30.0–36.0)
MCV: 99.3 fL (ref 80.0–100.0)
Monocytes Absolute: 1 10*3/uL (ref 0.1–1.0)
Monocytes Relative: 8 %
Neutro Abs: 9.4 10*3/uL — ABNORMAL HIGH (ref 1.7–7.7)
Neutrophils Relative %: 78 %
Platelets: 169 10*3/uL (ref 150–400)
RBC: 2.91 MIL/uL — ABNORMAL LOW (ref 4.22–5.81)
RDW: 14.7 % (ref 11.5–15.5)
WBC: 12 10*3/uL — ABNORMAL HIGH (ref 4.0–10.5)
nRBC: 0 % (ref 0.0–0.2)

## 2021-12-02 LAB — BASIC METABOLIC PANEL
Anion gap: 7 (ref 5–15)
BUN: 7 mg/dL — ABNORMAL LOW (ref 8–23)
CO2: 34 mmol/L — ABNORMAL HIGH (ref 22–32)
Calcium: 8.2 mg/dL — ABNORMAL LOW (ref 8.9–10.3)
Chloride: 90 mmol/L — ABNORMAL LOW (ref 98–111)
Creatinine, Ser: 0.34 mg/dL — ABNORMAL LOW (ref 0.61–1.24)
GFR, Estimated: >60 mL/min (ref >60)
Glucose, Bld: 113 mg/dL — ABNORMAL HIGH (ref 70–99)
Potassium: 4 mmol/L (ref 3.5–5.1)
Sodium: 131 mmol/L — ABNORMAL LOW (ref 135–145)

## 2021-12-02 LAB — MAGNESIUM: Magnesium: 1.5 mg/dL — ABNORMAL LOW (ref 1.7–2.4)

## 2021-12-02 LAB — LIPOPROTEIN A (LPA): Lipoprotein (a): 65.5 nmol/L — ABNORMAL HIGH (ref <75.0)

## 2021-12-02 MED ORDER — MENTHOL 3 MG MT LOZG
1.0000 | LOZENGE | OROMUCOSAL | Status: DC | PRN
  Administered 2021-12-02: 3 mg via ORAL
  Filled 2021-12-02: qty 9

## 2021-12-02 MED ORDER — MAGNESIUM SULFATE 2 GM/50ML IV SOLN
2.0000 g | Freq: Once | INTRAVENOUS | Status: AC
  Administered 2021-12-02: 2 g via INTRAVENOUS
  Filled 2021-12-02: qty 50

## 2021-12-02 NOTE — Progress Notes (Signed)
Physical Therapy Treatment Patient Details Name: Ronnie Snyder MRN: [id number] DOB: Jan 22, 1941 Today's Date: 12/02/2021   History of Present Illness The pt is an 81 yo male presenting 7/9 after a fall at home (possible syncopal event). Imaging reveals R proximal humerus fx, plan is for conservative management with a sling. In ED O2 saturations were in the 30s, pt placed on BiPAP, transferred to [facility] 7/14 for cath which showed severe aortic stenosis. Possible work up by TAVR team. PMH: HTN, anemia, PAD, CAD, stroke, HLD, GERD, and hepatic stenosis.    PT Comments    The pt was agreeable to session, continues to endorse desire to d/c to SNF for rehab after hospitalization as recommended at initial evaluation. The pt requires modA to complete bed mobility and sit-stand transfers due to poor functional strength in BLE to power up to standing. Once in standing, pt with no instances of buckling, but reports weakness in RLE and ambulates with reduced DF. The pt was not on SpO2 monitoring upon our arrival, SpO2 on pulse ox reading 86-90% on 4L O2. The pt did not report SOB (reports "I don't know") when asked during session. Will continue to benefit from skilled PT to progress ambulation endurance, stability, and strength in LE for transfers.    Recommendations for follow up therapy are one component of a multi-disciplinary discharge planning process, led by the attending physician.  Recommendations may be updated based on patient status, additional functional criteria and insurance authorization.  Follow Up Recommendations  Skilled nursing-short term rehab (<3 hours/day) Can patient physically be transported by private vehicle: No   Assistance Recommended at Discharge Frequent or constant Supervision/Assistance  Patient can return home with the following A little help with walking and/or transfers;A little help with bathing/dressing/bathroom;Assistance with cooking/housework;Assistance with  feeding;Direct supervision/assist for medications management;Direct supervision/assist for financial management;Assist for transportation;Help with stairs or ramp for entrance   Equipment Recommendations  None recommended by PT    Recommendations for Other Services       Precautions / Restrictions Precautions Precautions: Fall Precaution Comments: pt reports 2-3 other falls in last 6 months Required Braces or Orthoses: Sling Restrictions Weight Bearing Restrictions: Yes RUE Weight Bearing: Non weight bearing Other Position/Activity Restrictions: no ROM to shoulder, may dangle, OK to do ROM to elbow/wrist/hand per ortho note.     Mobility  Bed Mobility Overal bed mobility: Needs Assistance Bed Mobility: Supine to Sit     Supine to sit: Mod assist, HOB elevated     General bed mobility comments: modA to manage trunk while pt maintains NWB RUE    Transfers Overall transfer level: Needs assistance Equipment used: 1 person hand held assist Transfers: Sit to/from Stand Sit to Stand: Mod assist           General transfer comment: modA to rise, poor initiation with BLE    Ambulation/Gait Ambulation/Gait assistance: Min assist Gait Distance (Feet): 8 Feet (+ 15 ft) Assistive device: 1 person hand held assist Gait Pattern/deviations: Step-through pattern, Decreased stride length Gait velocity: decreased Gait velocity interpretation: <1.31 ft/sec, indicative of household ambulator   General Gait Details: small steps with minimal clearance. dependent on minA through [facility]       Balance Overall balance assessment: Needs assistance Sitting-balance support: No upper extremity supported, Feet supported Sitting balance-Leahy Scale: Fair     Standing balance support: Single extremity supported Standing balance-Leahy Scale: Poor Standing balance comment: min to mod A for posterior lean  Cognition Arousal/Alertness:  Awake/alert Behavior During Therapy: WFL for tasks assessed/performed Overall Cognitive Status: No family/caregiver present to determine baseline cognitive functioning                                 General Comments: pt following commands and instructions but poor insight and awareness vs baseline personality. Pt deflecting most subjective questions by stating "I don't know" or "I haven't thought about it" when asked about pain or SOB.        Exercises      General Comments General comments (skin integrity, edema, etc.): pt not on SpO2 monitoring upon our arrival, SpO2 reading 86-90% on 4L      Pertinent Vitals/Pain Pain Assessment Pain Assessment: Faces Faces Pain Scale: Hurts even more Pain Location: R shoulder and general body aches since fall Pain Descriptors / Indicators: Shooting Pain Intervention(s): Limited activity within patient's tolerance, Monitored during session, Repositioned     PT Goals (current goals can now be found in the care plan section) Acute Rehab PT Goals Patient Stated Goal: pt is agreeable to ST-SNF PT Goal Formulation: With patient Time For Goal Achievement: 12/12/21 Potential to Achieve Goals: Fair Progress towards PT goals: Progressing toward goals    Frequency    Min 3X/week      PT Plan Current plan remains appropriate    Co-evaluation PT/OT/SLP Co-Evaluation/Treatment: Yes Reason for Co-Treatment: Necessary to address cognition/behavior during functional activity;For patient/therapist safety;To address functional/ADL transfers PT goals addressed during session: Mobility/safety with mobility;Balance;Proper use of DME;Strengthening/ROM        AM-PAC PT "6 Clicks" Mobility   Outcome Measure  Help needed turning from your back to your side while in a flat bed without using bedrails?: A Lot Help needed moving from lying on your back to sitting on the side of a flat bed without using bedrails?: A Lot Help needed moving  to and from a bed to a chair (including a wheelchair)?: A Lot Help needed standing up from a chair using your arms (e.g., wheelchair or bedside chair)?: A Lot Help needed to walk in hospital room?: Total (unable to walk 20 ft at this session) Help needed climbing 3-5 steps with a railing? : Total 6 Click Score: 10    End of Session Equipment Utilized During Treatment: Oxygen;Gait belt Activity Tolerance: Patient limited by fatigue Patient left: in chair;with chair alarm set;with call bell/phone within reach Nurse Communication: Mobility status PT Visit Diagnosis: Difficulty in walking, not elsewhere classified (R26.2);Other abnormalities of gait and mobility (R26.89);History of falling (Z91.81);Pain Pain - Right/Left: Right Pain - part of body: Shoulder     Time: 9449-6759 PT Time Calculation (min) (ACUTE ONLY): 33 min  Charges:  $Therapeutic Activity: 8-22 mins                     West Carbo, PT, DPT   Acute Rehabilitation Department   Sandra Cockayne 12/02/2021, 10:23 AM

## 2021-12-02 NOTE — Progress Notes (Signed)
Progress Note  Patient Name: Ronnie Snyder Date of Encounter: 12/02/2021  [facility] Cardiologist: None   Subjective   Feels weak. No chest pain. Breathing is stable. Working with PT.  Inpatient Medications    Scheduled Meds:  aspirin EC  81 mg Oral Daily   bethanechol  5 mg Oral TID   Chlorhexidine Gluconate Cloth  6 each Topical Daily   folic acid  1 mg Oral Daily   heparin  5,000 Units Subcutaneous Q8H   levothyroxine  75 mcg Oral Q0600   lidocaine  1 patch Transdermal Q24H   mouth rinse  15 mL Mouth Rinse 4 times per day   rosuvastatin  20 mg Oral Daily   sodium chloride flush  3 mL Intravenous Q12H   tamsulosin  0.4 mg Oral Daily   Continuous Infusions:  sodium chloride Stopped (11/27/21 0746)   sodium chloride     PRN Meds: sodium chloride, sodium chloride, acetaminophen, acetaminophen, docusate sodium, fentaNYL (SUBLIMAZE) injection, ipratropium-albuterol, labetalol, menthol-cetylpyridinium, ondansetron (ZOFRAN) IV, mouth rinse, oxyCODONE, polyethylene glycol, sodium chloride flush, traMADol   Vital Signs    Vitals:   12/01/21 1116 12/01/21 1200 12/01/21 2107 12/02/21 0416  BP: (!) 144/64  (!) 150/70 136/70  Pulse: 66  69 69  Resp: 20  (!) 23 20  Temp: 97.8 F (36.6 C)  (!) 97.4 F (36.3 C) 97.6 F (36.4 C)  TempSrc: Axillary  Oral Oral  SpO2: 98% 94% 92%   Weight:      Height:        Intake/Output Summary (Last 24 hours) at 12/02/2021 2671 Last data filed at 12/02/2021 0600 Gross per 24 hour  Intake --  Output 1650 ml  Net -1650 ml       12/01/2021    5:00 AM 11/30/2021    5:56 AM 11/29/2021    5:00 AM  Last 3 Weights  Weight (lbs) 103 lb 9.9 oz 103 lb 13.4 oz 103 lb 9.9 oz  Weight (kg) 47 kg 47.1 kg 47 kg      Telemetry    NSR, 9 beat run of SVT - Personally Reviewed  ECG    No new tracing - Personally Reviewed  Physical Exam   GEN: Elderly male, frail appearing, NAD  Neck: No JVD Cardiac: RRR, 3/6 harsh systolic murmur  heard throughout the precordium Respiratory: Diminished but clear GI: Soft, nontender, non-distended  MS: No edema; No deformity. Right groin access site c/d/i Neuro:  Nonfocal  Psych: Normal affect   Labs    High Sensitivity Troponin:   Recent Labs  Lab 11/25/21 1740 11/25/21 1957 11/25/21 2313 11/26/21 0303  TROPONINIHS 136* 89* 317* 471*      Chemistry Recent Labs  Lab 11/25/21 1740 11/25/21 1742 11/27/21 0157 11/28/21 0425 11/30/21 0423 11/30/21 0950 11/30/21 1003 11/30/21 1416 12/02/21 0333  NA 139   < > 140 136 134* 130* 131*  131*  --  131*  K 4.7   < > 3.2* 4.6 4.2 4.1 4.1  4.1  --  4.0  CL 97*   < > 97* 94* 91*  --   --   --  90*  CO2 32   < > 34* 34* 38*  --   --   --  34*  GLUCOSE 181*   < > 118* 95 109*  --   --   --  113*  BUN 32*   < > 25* 20 9  --   --   --  7*  CREATININE 0.62   < > 0.56* <0.30* 0.33*  --   --  0.35* 0.34*  CALCIUM 9.0   < > 8.6* 8.5* 8.5*  --   --   --  8.2*  MG  --   --  1.5* 1.8  --   --   --   --  1.5*  PROT 6.4*  --   --   --  5.4*  --   --   --   --   ALBUMIN 3.3*  --   --   --  2.6*  --   --   --   --   AST 118*  --   --   --  30  --   --   --   --   ALT 100*  --   --   --  58*  --   --   --   --   ALKPHOS 87  --   --   --  73  --   --   --   --   BILITOT 1.0  --   --   --  0.5  --   --   --   --   GFRNONAA >60   < > >60 NOT CALCULATED >60  --   --  >60 >60  ANIONGAP 10   < > '9 8 5  '$ --   --   --  7   < > = values in this interval not displayed.     Lipids  Recent Labs  Lab 11/26/21 0303  CHOL 102  TRIG 53  HDL 39*  LDLCALC 52  CHOLHDL 2.6     Hematology Recent Labs  Lab 11/30/21 0423 11/30/21 0950 11/30/21 1003 11/30/21 1416 12/01/21 1816 12/02/21 0333  WBC 11.1*  --   --  12.6*  --  12.0*  RBC 3.13*  --   --  3.08* 2.69* 2.91*  HGB 10.5*   < > 9.9*  10.2* 10.4*  --  9.8*  HCT 32.6*   < > 29.0*  30.0* 31.2*  --  28.9*  MCV 104.2*  --   --  101.3*  --  99.3  MCH 33.5  --   --  33.8  --  33.7  MCHC  32.2  --   --  33.3  --  33.9  RDW 15.1  --   --  14.7  --  14.7  PLT 185  --   --  194  --  169   < > = values in this interval not displayed.    Thyroid  Recent Labs  Lab 11/25/21 1925  TSH 1.394     BNP Recent Labs  Lab 11/27/21 0157  BNP 433.1*     DDimer  Recent Labs  Lab 11/25/21 2313  DDIMER 2.81*      Radiology    CARDIAC CATHETERIZATION  Addendum Date: 11/30/2021     2nd Diag lesion is 50% stenosed.   RPDA lesion is 80% stenosed.   Dist LM lesion is 25% stenosed.   The left ventricular systolic function is normal.   LV end diastolic pressure is normal.   The left ventricular ejection fraction is 55-65% by visual estimate.   There is severe aortic valve stenosis.  Calculated aortic valve area 0.93 cm2.  Mean gradient 30 mmHg   Aortic saturation 98%, PA saturation 69%, PA pressure 30/7, mean PA pressure 15 mmHg, mean pulmonary capillary wedge  pressure 2 mmHg, cardiac output 4.91 L/min, cardiac index 3.21. Severely calcified right common femoral and iliac system.  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 Diffuse, calcific coronary artery disease which does not appear obstructive.  There is a small PDA with a severe lesion but this would not be a target for revascularization. Severe aortic stenosis by calculated valve area.  Further plans per the TAVR team.  Result Date: 11/30/2021   2nd Diag lesion is 50% stenosed.   RPDA lesion is 80% stenosed.   Dist LM lesion is 25% stenosed.   The left ventricular systolic function is normal.   LV end diastolic pressure is normal.   The left ventricular ejection fraction is 55-65% by visual estimate.   There is severe aortic valve stenosis.  Calculated aortic valve area 0.93 cm2.  Mean gradient 30 mmHg   Aortic saturation 98%, PA saturation 69%, PA pressure 30/7, mean PA pressure 15 mmHg, mean pulmonary capillary wedge pressure 2 mmHg, cardiac output 4.91 L/min, cardiac index 3.21. Severely calcified right common femoral  and iliac system.  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 Diffuse, calcific coronary artery disease which does not appear obstructive.  There is a small PDA with a severe lesion but this would not be a target for revascularization. Severe aortic stenosis by calculated valve area.  Further plans per the TAVR team.    Cardiac Studies   Cath 11/30/21:   2nd Diag lesion is 50% stenosed.   RPDA lesion is 80% stenosed.   Dist LM lesion is 25% stenosed.   The left ventricular systolic function is normal.   LV end diastolic pressure is normal.   The left ventricular ejection fraction is 55-65% by visual estimate.   There is severe aortic valve stenosis.  Calculated aortic valve area 0.93 cm2.  Mean gradient 30 mmHg   Aortic saturation 98%, PA saturation 69%, PA pressure 30/7, mean PA pressure 15 mmHg, mean pulmonary capillary wedge pressure 2 mmHg, cardiac output 4.91 L/min, cardiac index 3.21.   Severely calcified right common femoral and iliac system.  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  Diffuse, calcific coronary artery disease which does not appear obstructive.  There is a small PDA with a severe lesion but this would not be a target for revascularization.   Severe aortic stenosis by calculated valve area.  Further plans per the TAVR team.  TTE 11/26/21: IMPRESSIONS   1. Left ventricular ejection fraction, by estimation, is 50 to 55%. The  left ventricle has low normal function. The left ventricle has no regional  wall motion abnormalities. There is mild left ventricular hypertrophy.  Left ventricular diastolic  parameters are consistent with Grade II diastolic dysfunction  (pseudonormalization). Elevated left atrial pressure.   2. Right ventricular systolic function is normal. The right ventricular  size is normal. There is moderately elevated pulmonary artery systolic  pressure. The estimated right ventricular systolic pressure is 67.3 mmHg.    3. Left atrial size was moderately dilated.   4. The mitral valve is degenerative. Mild mitral valve regurgitation.  Mild mitral stenosis. The mean mitral valve gradient is 5.0 mmHg with  average heart rate of 102 bpm. Severe mitral annular calcification.   5. The inferior vena cava is dilated in size with <50% respiratory  variability, suggesting right atrial pressure of 15 mmHg.   6. The aortic valve was not well visualized. There is severe calcifcation  of the aortic valve. Aortic valve regurgitation is not visualized. Severe  aortic valve stenosis. AS moderate by gradients (Vmax 3.6 m/s, MG 31  mmHg), severe by AVA (0.7 cm^2) and  DI (0.21). Low SV index (35 cc/m^2), suspect paradoxical low flow low  gradient severe AS   Patient Profile     81 y.o. male with history of PAD s/p prior intervention of left lower extremity,  AAA, left common iliac artery aneurysm, left common femoral aneurysm, carotid artery disease, stroke, hypertension, hyperlipidemia, hypothyroidism, GERD, and hepatic steatosis who presented with fall and possible presyncope found to be in respiratory failure with sats 30% on arrival to the ED thought to be due to community acquired pneumonia and COPD. TTE here demonstrated severe AS with AVA 0.7cm2, DI 0.21 with mean gradient 64mHg (paradoxical low flow, low gradient AS) for which cardiology was consulted.   Assessment & Plan    #Severe Aortic Stenosis TTE with severe paradoxical AS with AVA (0.7 cm) and DI (0.21) mean gradient 343mg. Cath 11/30/21 confirmed severe AS with AVA 0.9cm2, mean gradient 307m. Will plan to see structural as outpatient for consideration of TAVR. If remains hospitalized on Monday, structural team can consult as inpatient. Notably, he is very frail which may impact his TAVR candidacy. - Will plan for further evaluation by structural team either as outpatient if discharged over the weekend vs inpatient if remains in the hospital on  Monday -Overall suspect his significant frailty and comorbidities may impact his candidacy for TAVR -Consider palliative consult  #Acute Hypoxemic Respiratory Failure Patient presented with severe acute hypoxemic respiratory failure with O2 sat initially of 30%.  Initially required BiPAP. Chest CTA negative for PE but was concerning for possible pneumonia.  Also showed small to moderate right and small left pleural effusions. Echo showed LVEF of 50-55% with normal wall motion, mild LVH, grade 2 diastolic dysfunction, and elevated elft atrial pressure. RV was normal with moderately elevated RVSP of 45.9 mmHg. He has been on antibiotics and lasix with significant improvement. RHC with normal filling pressures. -Continue ABX per primary -RHC with low filling pressures; no further IV diuresis needed   #Elevated Troponin #Diffuse, nonobstructive CAD High-sensitivity troponin elevated at 136 >> 89 >> 317 >> 417. EKG showed no acute ischemic changes. Echo with normal LV function and no regional wall motion abnormalities. Cath with diffuse, non obstructive CAD. Will continue with medical management. -Continue ASA '81mg'$  daily -Continue crestor '20mg'$  daily   #PAD S/p left iliofemoral endarterectomy with patch angioplasty in 01/2011 and then a left femoral popliteal atherectomy with stenting in 09/2012. - Continue aspirin and statin. - Followed by Vascular Surgery as an outpatient.   #AAA #Left Common Iliac Artery Aneurysm #Left Common Femoral Aneurysm CTA this admission showed fusiform aneurysm of the abdominal aorta measuring up to 4.1 x 3.9 cm, aneurysm of the left common iliac artery measuring up to 1.9 x 1.9 cm, and an aneurysm or pseudoaneurysm of the left common femoral artery measuring up to 3.2 x 2.9 cm. All aneurysmal findings are unchanged compared to prior imaging. - Followed by Vascular Surgery as an outpatient.   #Hypertension Controlled. Not on medications.   #Tobacco Abuse Patient has  a long smoking history and continues to smoke about <10 cigarettes per day. - Cessation recommended  #Right Humeral Fracture: No immediate plans for surgery. In a right arm sling.  -Management per primary and ortho    For questions or updates, please contact [facility] consult [email or web address] for  contact info under        Signed, Freada Bergeron, MD  12/02/2021, 8:52 AM

## 2021-12-02 NOTE — Progress Notes (Signed)
Occupational Therapy Treatment Patient Details Name: Ronnie Snyder MRN: [id number] DOB: Nov 18, 1940 Today's Date: 12/02/2021   History of present illness The pt is an 81 yo male presenting 7/9 after a fall at home (possible syncopal event). Imaging reveals R proximal humerus fx, plan is for conservative management with a sling. In ED O2 saturations were in the 30s, pt placed on BiPAP, transferred to [facility] 7/14 for cath which showed severe aortic stenosis. Possible work up by TAVR team. PMH: HTN, anemia, PAD, CAD, stroke, HLD, GERD, and hepatic stenosis.   OT comments  Pt seen after transfer from [facility] now post cath. On arrival pt reporting that his whole R side 'doesnt work right', reporting he will not be able to return to home as he is primary caregiver of his wife. Pt did require increased A for transfer this date, cues provided throughout for positioning of R UE for bed mobility, transition to and from standing as well as shower bout of standing a sink for grooming. Pt requiring mod A for UB management of sling and gown, and set up-min A for grooming while seated at sink. Difficulty with sustaining O2 >90% with  pt to 4L via [place], raning from 88-91 inconsistently. MD and RN made aware. No dizziness throughout. OT provided education on benefit of OOB activity, needs for assist, and with strong recommendation for d/c to SNF at time of d/c to maximize indepa nd safety with ADLs and functional transfers/mobility prior to return to home. Established goals and POC continue to be appropriate at this time.    Recommendations for follow up therapy are one component of a multi-disciplinary discharge planning process, led by the attending physician.  Recommendations may be updated based on patient status, additional functional criteria and insurance authorization.    Follow Up Recommendations  Skilled nursing-short term rehab (<3 hours/day)    Assistance Recommended at Discharge Frequent or constant  Supervision/Assistance  Patient can return home with the following  A lot of help with walking and/or transfers;A lot of help with bathing/dressing/bathroom   Equipment Recommendations  Other (comment) (defer to next LOC)    Recommendations for Other Services      Precautions / Restrictions Precautions Precautions: Fall Precaution Comments: pt unable to report number of falls Required Braces or Orthoses: Sling Restrictions Weight Bearing Restrictions: Yes RUE Weight Bearing: Non weight bearing Other Position/Activity Restrictions: no ROM to shoulder, AROM elbow/wrist/hand, edema noted.       Mobility Bed Mobility                    Transfers Overall transfer level: Needs assistance Equipment used: 1 person hand held assist Transfers: Sit to/from Stand Sit to Stand: Mod assist           General transfer comment: cues for technique from all seated surface, difficulty with anterior weight shift and need for cues for NWB to R Ue throughout     Balance                                           ADL either performed or assessed with clinical judgement   ADL       Grooming: Minimal assistance;Wash/dry hands;Wash/dry face;Oral care           Upper Body Dressing : Moderate assistance;Sitting Upper Body Dressing Details (indicate cue type and reason): adjust gown and donn/adjust  sling. decreased effort Lower Body Dressing: Maximal assistance   Toilet Transfer: Moderate assistance Toilet Transfer Details (indicate cue type and reason): cues for technique and up to mod A for full transition to and from standing, min-mod for transfer simulated to chair                Extremity/Trunk Assessment Upper Extremity Assessment Upper Extremity Assessment: RUE deficits/detail RUE Deficits / Details: pt without sling on at arrival, tolerated repositioning for donning of sling and positioning R UE for protection and comfort= AROM wrist/hand intact,  edema noted throughout.       Cervical / Trunk Assessment Cervical / Trunk Assessment: Kyphotic    Vision       Perception     Praxis      Cognition Arousal/Alertness: Awake/alert Behavior During Therapy: WFL for tasks assessed/performed Overall Cognitive Status: No family/caregiver present to determine baseline cognitive functioning                                 General Comments: pt follows commands, but noted with decreased safety and insight to deficits.        Exercises      Shoulder Instructions       General Comments pt on SpO2 throughout session, noted sustaining between 86-90 on 2 and 3L, on4L ranging to 90%. MD in room and aware with request made for RN to connect SpO2 for pleth.    Pertinent Vitals/ Pain       Pain Assessment Pain Assessment: Faces Faces Pain Scale: Hurts even more Pain Location: pt reporting R UE shoulder, with his R leg 'not working' Pain Descriptors / Indicators: Shooting Pain Intervention(s): Limited activity within patient's tolerance  Home Living Family/patient expects to be discharged to:: Skilled nursing facility                                 Additional Comments: pt reports he lives with wife and is her primary caregiver      Prior Functioning/Environment              Frequency           Progress Toward Goals  OT Goals(current goals can now be found in the care plan section)     Acute Rehab OT Goals Patient Stated Goal: to get better OT Goal Formulation: With patient Time For Goal Achievement: 12/13/21 Potential to Achieve Goals: Good  Plan      Co-evaluation    PT/OT/SLP Co-Evaluation/Treatment: Yes Reason for Co-Treatment: Necessary to address cognition/behavior during functional activity PT goals addressed during session: Mobility/safety with mobility;Balance;Proper use of DME;Strengthening/ROM OT goals addressed during session: ADL's and self-care      AM-PAC OT  "6 Clicks" Daily Activity     Outcome Measure                    End of Session Equipment Utilized During Treatment: Oxygen      Activity Tolerance Patient tolerated treatment well   Patient Left in chair;with call bell/phone within reach;with chair alarm set;with nursing/sitter in room   Nurse Communication Mobility status        Time: 2536-6440 OT Time Calculation (min): 41 min  Charges: OT General Charges $OT Visit: 1 Visit OT Treatments $Self Care/Home Management : 8-22 mins  Othal Kubitz OTR/L acute rehab services Office: [phone]  Ally L Quaid Yeakle 12/02/2021, 9:44 AM

## 2021-12-02 NOTE — Progress Notes (Signed)
[facility]                                                                               Ronnie Snyder, is a 81 y.o. male, DOB - May 24, 1940, AST:[id number] Admit date - 11/25/2021    Outpatient Primary MD for the patient is Luetta Nutting, DO  LOS - 7  days    Brief summary   81 year old gentleman with piror h/o GERD, PAD, AAA, hypertension. Hypothyroidism, hyperlipidemia, stroke. Diastolic CHF, presented  to ED after a fall at home with syncope,and sob. He was admitted for acute respiratory failure with hypoxia and right proximal humerus fracture..  Cardiology on board . Patient underwent cath showing diffuse non obstructive CAD. Recommended medical management.  Patient was found to have severe aortic stenosis, will need evaluation for TAVR by the structural team.   Assessment & Plan    Assessment and Plan:  Acute respiratory failure with hypoxia and hypercapnia probably secondary to a combination of CAP and COPD exacerbation.  Required BIPAP initially and transitioned to [place] oxygen. Ptient currently on 3 to 4 lit /min of oxygen.  CTA neg for PE.  Started the patient on IV rocephin and zithromax, complete the course of rocephin - 7 days.  Urine strep and legionella antigen negative.  Check ambulating oxygen levels with PT prior to discharge. With his h/o copd and continuous smoking, he might need oxygen on discharge.  No wheezing heard on exam today. Persistent luekocytosis.   Severe aortic stenosis with diastolic CHF. Echo showed preserved LVEF with a grade 2 diastolic dysfunction.  S/p cath on 11/30/21, results reviewed with the patient. Medical management for now.  Recommend evaluation by the structural team on Monday to see if he is a candidate for TAVR.  Continue with aspirin and statin.   Displaced right humeral neck fracture status post fall  Ortho consulted, no immediate plans for surgery.  Continue with RUE sling.    Hypertension:  Bp parameters are  well controlled.    Hyperlipidemia:  Continue with crestor.    Hypothyroidism:  Continue with synthroid 75 mcg daily.  Tsh wnl.    Urinary Retention:  S/p foley catheter, will do voiding trial today.  Pt was started on flomax  and Urecholine daily.    AAA, / PAD  Continue with aspirin and statin.   Therapy evaluations ordered and pending.  Check ambulating oxygen levels with PT.     Hyponatremia:  Sodium of 131 and stable.  Unclear etiology. He is asymptomatic .    Macrocytic anemia:  Anemia panel unremarkable.  Hemoglobin between 9.8 to 10.5.  Continue to monitor.    Hypomagnesemia:  Replaced, recheck in am.        RN Pressure Injury Documentation: Pressure Injury 11/25/21 Sacrum Medial Stage 1 -  Intact skin with non-blanchable redness of a localized area usually over a bony prominence. (Active)  11/25/21 2230  Location: Sacrum  Location Orientation: Medial  Staging: Stage 1 -  Intact skin with non-blanchable redness of a localized area usually over a bony prominence.  Wound Description (Comments):   Present on Admission: Yes  Dressing Type Foam - Lift dressing to  assess site every shift 12/02/21 1100   Present on admission. Local wound care.      Estimated body mass index is 16.23 kg/m as calculated from the following:   Height as of this encounter: '5\' 7"'$  (1.702 m).   Weight as of this encounter: 47 kg.  Code Status: full code.  DVT Prophylaxis:  heparin injection 5,000 Units Start: 11/30/21 2200 SCDs Start: 11/25/21 2028   Level of Care: Level of care: Progressive Cardiac Family Communication: none at bedside.   Disposition Plan:     Remains inpatient appropriate:  SNF placement.   Procedures:  Cardiac cath on 7/14.  2nd Diag lesion is 50% stenosed.   RPDA lesion is 80% stenosed.   Dist LM lesion is 25% stenosed.   The left ventricular systolic function is normal.   LV end diastolic pressure is normal.   The left ventricular ejection  fraction is 55-65% by visual estimate.   There is severe aortic valve stenosis.  Calculated aortic valve area 0.93 cm2.  Mean gradient 30 mmHg   Aortic saturation 98%, PA saturation 69%, PA pressure 30/7, mean PA pressure 15 mmHg, mean pulmonary capillary wedge pressure 2 mmHg, cardiac output 4.91 L/min, cardiac index 3.21.   Severely calcified right common femoral and iliac system.  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  Diffuse, calcific coronary artery disease which does not appear obstructive.  There is a small PDA with a severe lesion but this would not be a target for revascularization.   Severe aortic stenosis by calculated valve area.  Further plans per the TAVR team.    Consultants:   PCCM.  Cardiology.   Antimicrobials:   Anti-infectives (From admission, onward)    Start     Dose/Rate Route Frequency Ordered Stop   11/26/21 2100  azithromycin (ZITHROMAX) 500 mg in sodium chloride 0.9 % 250 mL IVPB  Status:  Discontinued        500 mg 250 mL/hr over 60 Minutes Intravenous Every 24 hours 11/25/21 2032 12/01/21 1113   11/26/21 2000  cefTRIAXone (ROCEPHIN) 2 g in sodium chloride 0.9 % 100 mL IVPB        2 g 200 mL/hr over 30 Minutes Intravenous Every 24 hours 11/25/21 2032 12/01/21 2111   11/25/21 1900  cefTRIAXone (ROCEPHIN) 1 g in sodium chloride 0.9 % 100 mL IVPB        1 g 200 mL/hr over 30 Minutes Intravenous  Once 11/25/21 1850 11/25/21 2019   11/25/21 1900  azithromycin (ZITHROMAX) 500 mg in sodium chloride 0.9 % 250 mL IVPB        500 mg 250 mL/hr over 60 Minutes Intravenous  Once 11/25/21 1850 11/25/21 2119        Medications  Scheduled Meds:  aspirin EC  81 mg Oral Daily   bethanechol  5 mg Oral TID   folic acid  1 mg Oral Daily   heparin  5,000 Units Subcutaneous Q8H   levothyroxine  75 mcg Oral Q0600   lidocaine  1 patch Transdermal Q24H   mouth rinse  15 mL Mouth Rinse 4 times per day   rosuvastatin  20 mg Oral Daily   sodium  chloride flush  3 mL Intravenous Q12H   tamsulosin  0.4 mg Oral Daily   Continuous Infusions:  sodium chloride Stopped (11/27/21 0746)   sodium chloride     magnesium sulfate bolus IVPB     PRN Meds:.sodium chloride, sodium chloride, acetaminophen, acetaminophen,  docusate sodium, fentaNYL (SUBLIMAZE) injection, ipratropium-albuterol, labetalol, menthol-cetylpyridinium, ondansetron (ZOFRAN) IV, mouth rinse, oxyCODONE, polyethylene glycol, sodium chloride flush, traMADol    Subjective:   Ronnie Snyder was seen and examined today. No new complaints.  Objective:   Vitals:   12/01/21 1116 12/01/21 1200 12/01/21 2107 12/02/21 0416  BP: (!) 144/64  (!) 150/70 136/70  Pulse: 66  69 69  Resp: 20  (!) 23 20  Temp: 97.8 F (36.6 C)  (!) 97.4 F (36.3 C) 97.6 F (36.4 C)  TempSrc: Axillary  Oral Oral  SpO2: 98% 94% 92%   Weight:      Height:        Intake/Output Summary (Last 24 hours) at 12/02/2021 1528 Last data filed at 12/02/2021 1310 Gross per 24 hour  Intake --  Output 1250 ml  Net -1250 ml    Filed Weights   11/29/21 0500 11/30/21 0556 12/01/21 0500  Weight: 47 kg 47.1 kg 47 kg     Exam General exam: Appears calm and comfortable  Respiratory system: air entry fair, on 4 lit oF [place] oxygen.  Cardiovascular system: S1 & S2 heard, RRR. No JVD, No pedal edema. Gastrointestinal system: Abdomen is nondistended, soft and nontender.  Normal bowel sounds heard. Central nervous system: Alert and oriented. No focal neurological deficits. Extremities: Symmetric 5 x 5 power. Skin: No rashes, lesions or ulcers Psychiatry:  Mood & affect appropriate.     Data Reviewed:  I have personally reviewed following labs and imaging studies   CBC Lab Results  Component Value Date   WBC 12.0 (H) 12/02/2021   RBC 2.91 (L) 12/02/2021   HGB 9.8 (L) 12/02/2021   HCT 28.9 (L) 12/02/2021   MCV 99.3 12/02/2021   MCH 33.7 12/02/2021   PLT 169 12/02/2021   MCHC 33.9 12/02/2021   RDW  14.7 12/02/2021   LYMPHSABS 0.6 (L) 12/02/2021   MONOABS 1.0 12/02/2021   EOSABS 0.3 12/02/2021   BASOSABS 0.1 14/43/1540     Last metabolic panel Lab Results  Component Value Date   NA 131 (L) 12/02/2021   K 4.0 12/02/2021   CL 90 (L) 12/02/2021   CO2 34 (H) 12/02/2021   BUN 7 (L) 12/02/2021   CREATININE 0.34 (L) 12/02/2021   GLUCOSE 113 (H) 12/02/2021   GFRNONAA >60 12/02/2021   GFRAA 116 05/30/2020   CALCIUM 8.2 (L) 12/02/2021   PHOS 3.1 11/28/2021   PROT 5.4 (L) 11/30/2021   ALBUMIN 2.6 (L) 11/30/2021   BILITOT 0.5 11/30/2021   ALKPHOS 73 11/30/2021   AST 30 11/30/2021   ALT 58 (H) 11/30/2021   ANIONGAP 7 12/02/2021    CBG (last 3)  No results for input(s): "GLUCAP" in the last 72 hours.    Coagulation Profile: Recent Labs  Lab 11/25/21 2313  INR 1.3*      Radiology Studies: No results found.     Hosie Poisson M.D. [facility] 12/02/2021, 3:28 PM  Available via Epic secure chat 7am-7pm After 7 pm, please refer to night coverage provider listed on amion.

## 2021-12-03 ENCOUNTER — Encounter (HOSPITAL_COMMUNITY): Payer: Self-pay | Admitting: Critical Care Medicine

## 2021-12-03 DIAGNOSIS — S00412A Abrasion of left ear, initial encounter: Secondary | ICD-10-CM | POA: Diagnosis not present

## 2021-12-03 DIAGNOSIS — M2681 Anterior soft tissue impingement: Secondary | ICD-10-CM | POA: Diagnosis not present

## 2021-12-03 DIAGNOSIS — R262 Difficulty in walking, not elsewhere classified: Secondary | ICD-10-CM | POA: Diagnosis not present

## 2021-12-03 DIAGNOSIS — R1312 Dysphagia, oropharyngeal phase: Secondary | ICD-10-CM | POA: Diagnosis not present

## 2021-12-03 DIAGNOSIS — I350 Nonrheumatic aortic (valve) stenosis: Secondary | ICD-10-CM

## 2021-12-03 DIAGNOSIS — S42211D Unspecified displaced fracture of surgical neck of right humerus, subsequent encounter for fracture with routine healing: Secondary | ICD-10-CM | POA: Diagnosis not present

## 2021-12-03 DIAGNOSIS — K2270 Barrett's esophagus without dysplasia: Secondary | ICD-10-CM | POA: Diagnosis not present

## 2021-12-03 DIAGNOSIS — I2510 Atherosclerotic heart disease of native coronary artery without angina pectoris: Secondary | ICD-10-CM | POA: Diagnosis not present

## 2021-12-03 DIAGNOSIS — K922 Gastrointestinal hemorrhage, unspecified: Secondary | ICD-10-CM | POA: Diagnosis not present

## 2021-12-03 DIAGNOSIS — K5901 Slow transit constipation: Secondary | ICD-10-CM | POA: Diagnosis not present

## 2021-12-03 DIAGNOSIS — M79641 Pain in right hand: Secondary | ICD-10-CM | POA: Diagnosis not present

## 2021-12-03 DIAGNOSIS — K760 Fatty (change of) liver, not elsewhere classified: Secondary | ICD-10-CM | POA: Diagnosis not present

## 2021-12-03 DIAGNOSIS — F330 Major depressive disorder, recurrent, mild: Secondary | ICD-10-CM | POA: Diagnosis not present

## 2021-12-03 DIAGNOSIS — M25511 Pain in right shoulder: Secondary | ICD-10-CM | POA: Diagnosis not present

## 2021-12-03 DIAGNOSIS — F32A Depression, unspecified: Secondary | ICD-10-CM | POA: Diagnosis not present

## 2021-12-03 DIAGNOSIS — M0579 Rheumatoid arthritis with rheumatoid factor of multiple sites without organ or systems involvement: Secondary | ICD-10-CM | POA: Diagnosis not present

## 2021-12-03 DIAGNOSIS — M2550 Pain in unspecified joint: Secondary | ICD-10-CM | POA: Diagnosis not present

## 2021-12-03 DIAGNOSIS — K0889 Other specified disorders of teeth and supporting structures: Secondary | ICD-10-CM | POA: Diagnosis not present

## 2021-12-03 DIAGNOSIS — E871 Hypo-osmolality and hyponatremia: Secondary | ICD-10-CM | POA: Diagnosis not present

## 2021-12-03 DIAGNOSIS — Z7982 Long term (current) use of aspirin: Secondary | ICD-10-CM | POA: Diagnosis not present

## 2021-12-03 DIAGNOSIS — E039 Hypothyroidism, unspecified: Secondary | ICD-10-CM | POA: Diagnosis not present

## 2021-12-03 DIAGNOSIS — S01319A Laceration without foreign body of unspecified ear, initial encounter: Secondary | ICD-10-CM | POA: Diagnosis not present

## 2021-12-03 DIAGNOSIS — E785 Hyperlipidemia, unspecified: Secondary | ICD-10-CM | POA: Diagnosis not present

## 2021-12-03 DIAGNOSIS — D500 Iron deficiency anemia secondary to blood loss (chronic): Secondary | ICD-10-CM | POA: Diagnosis not present

## 2021-12-03 DIAGNOSIS — R5381 Other malaise: Secondary | ICD-10-CM | POA: Diagnosis not present

## 2021-12-03 DIAGNOSIS — Z7901 Long term (current) use of anticoagulants: Secondary | ICD-10-CM | POA: Diagnosis not present

## 2021-12-03 DIAGNOSIS — R7303 Prediabetes: Secondary | ICD-10-CM | POA: Diagnosis not present

## 2021-12-03 DIAGNOSIS — Z0184 Encounter for antibody response examination: Secondary | ICD-10-CM | POA: Diagnosis not present

## 2021-12-03 DIAGNOSIS — R531 Weakness: Secondary | ICD-10-CM | POA: Diagnosis not present

## 2021-12-03 DIAGNOSIS — K5900 Constipation, unspecified: Secondary | ICD-10-CM | POA: Diagnosis not present

## 2021-12-03 DIAGNOSIS — M19041 Primary osteoarthritis, right hand: Secondary | ICD-10-CM | POA: Diagnosis not present

## 2021-12-03 DIAGNOSIS — F419 Anxiety disorder, unspecified: Secondary | ICD-10-CM | POA: Diagnosis not present

## 2021-12-03 DIAGNOSIS — R58 Hemorrhage, not elsewhere classified: Secondary | ICD-10-CM | POA: Diagnosis not present

## 2021-12-03 DIAGNOSIS — Z7401 Bed confinement status: Secondary | ICD-10-CM | POA: Diagnosis not present

## 2021-12-03 DIAGNOSIS — M19042 Primary osteoarthritis, left hand: Secondary | ICD-10-CM | POA: Diagnosis not present

## 2021-12-03 DIAGNOSIS — J9602 Acute respiratory failure with hypercapnia: Secondary | ICD-10-CM | POA: Diagnosis not present

## 2021-12-03 DIAGNOSIS — I509 Heart failure, unspecified: Secondary | ICD-10-CM | POA: Diagnosis not present

## 2021-12-03 DIAGNOSIS — Z515 Encounter for palliative care: Secondary | ICD-10-CM | POA: Diagnosis not present

## 2021-12-03 DIAGNOSIS — W19XXXA Unspecified fall, initial encounter: Secondary | ICD-10-CM | POA: Diagnosis not present

## 2021-12-03 DIAGNOSIS — M79601 Pain in right arm: Secondary | ICD-10-CM | POA: Diagnosis not present

## 2021-12-03 DIAGNOSIS — R601 Generalized edema: Secondary | ICD-10-CM | POA: Diagnosis not present

## 2021-12-03 DIAGNOSIS — R0902 Hypoxemia: Secondary | ICD-10-CM | POA: Diagnosis not present

## 2021-12-03 DIAGNOSIS — N401 Enlarged prostate with lower urinary tract symptoms: Secondary | ICD-10-CM | POA: Diagnosis not present

## 2021-12-03 DIAGNOSIS — I10 Essential (primary) hypertension: Secondary | ICD-10-CM | POA: Diagnosis not present

## 2021-12-03 DIAGNOSIS — Z978 Presence of other specified devices: Secondary | ICD-10-CM | POA: Diagnosis not present

## 2021-12-03 DIAGNOSIS — M454 Ankylosing spondylitis of thoracic region: Secondary | ICD-10-CM | POA: Diagnosis not present

## 2021-12-03 DIAGNOSIS — M48061 Spinal stenosis, lumbar region without neurogenic claudication: Secondary | ICD-10-CM | POA: Diagnosis not present

## 2021-12-03 DIAGNOSIS — M545 Low back pain, unspecified: Secondary | ICD-10-CM | POA: Diagnosis not present

## 2021-12-03 DIAGNOSIS — D849 Immunodeficiency, unspecified: Secondary | ICD-10-CM | POA: Diagnosis not present

## 2021-12-03 DIAGNOSIS — J90 Pleural effusion, not elsewhere classified: Secondary | ICD-10-CM | POA: Diagnosis not present

## 2021-12-03 DIAGNOSIS — I119 Hypertensive heart disease without heart failure: Secondary | ICD-10-CM | POA: Diagnosis not present

## 2021-12-03 DIAGNOSIS — E038 Other specified hypothyroidism: Secondary | ICD-10-CM | POA: Diagnosis not present

## 2021-12-03 DIAGNOSIS — J9601 Acute respiratory failure with hypoxia: Secondary | ICD-10-CM | POA: Diagnosis not present

## 2021-12-03 DIAGNOSIS — M6281 Muscle weakness (generalized): Secondary | ICD-10-CM | POA: Diagnosis not present

## 2021-12-03 DIAGNOSIS — F3289 Other specified depressive episodes: Secondary | ICD-10-CM | POA: Diagnosis not present

## 2021-12-03 DIAGNOSIS — I700 Atherosclerosis of aorta: Secondary | ICD-10-CM | POA: Diagnosis not present

## 2021-12-03 DIAGNOSIS — S61411A Laceration without foreign body of right hand, initial encounter: Secondary | ICD-10-CM | POA: Diagnosis not present

## 2021-12-03 DIAGNOSIS — Z7189 Other specified counseling: Secondary | ICD-10-CM

## 2021-12-03 DIAGNOSIS — R339 Retention of urine, unspecified: Secondary | ICD-10-CM | POA: Diagnosis not present

## 2021-12-03 DIAGNOSIS — F29 Unspecified psychosis not due to a substance or known physiological condition: Secondary | ICD-10-CM | POA: Diagnosis not present

## 2021-12-03 DIAGNOSIS — J432 Centrilobular emphysema: Secondary | ICD-10-CM | POA: Diagnosis not present

## 2021-12-03 DIAGNOSIS — X58XXXA Exposure to other specified factors, initial encounter: Secondary | ICD-10-CM | POA: Diagnosis not present

## 2021-12-03 DIAGNOSIS — F411 Generalized anxiety disorder: Secondary | ICD-10-CM | POA: Diagnosis not present

## 2021-12-03 DIAGNOSIS — K08109 Complete loss of teeth, unspecified cause, unspecified class: Secondary | ICD-10-CM | POA: Diagnosis not present

## 2021-12-03 DIAGNOSIS — F5105 Insomnia due to other mental disorder: Secondary | ICD-10-CM | POA: Diagnosis not present

## 2021-12-03 DIAGNOSIS — J449 Chronic obstructive pulmonary disease, unspecified: Secondary | ICD-10-CM | POA: Diagnosis not present

## 2021-12-03 DIAGNOSIS — M5030 Other cervical disc degeneration, unspecified cervical region: Secondary | ICD-10-CM | POA: Diagnosis not present

## 2021-12-03 DIAGNOSIS — F5109 Other insomnia not due to a substance or known physiological condition: Secondary | ICD-10-CM | POA: Diagnosis not present

## 2021-12-03 DIAGNOSIS — G8911 Acute pain due to trauma: Secondary | ICD-10-CM | POA: Diagnosis not present

## 2021-12-03 DIAGNOSIS — S42301A Unspecified fracture of shaft of humerus, right arm, initial encounter for closed fracture: Secondary | ICD-10-CM | POA: Diagnosis not present

## 2021-12-03 DIAGNOSIS — E46 Unspecified protein-calorie malnutrition: Secondary | ICD-10-CM | POA: Diagnosis not present

## 2021-12-03 DIAGNOSIS — I5032 Chronic diastolic (congestive) heart failure: Secondary | ICD-10-CM | POA: Diagnosis not present

## 2021-12-03 LAB — BASIC METABOLIC PANEL
Anion gap: 6 (ref 5–15)
BUN: 6 mg/dL — ABNORMAL LOW (ref 8–23)
CO2: 37 mmol/L — ABNORMAL HIGH (ref 22–32)
Calcium: 8.5 mg/dL — ABNORMAL LOW (ref 8.9–10.3)
Chloride: 89 mmol/L — ABNORMAL LOW (ref 98–111)
Creatinine, Ser: 0.4 mg/dL — ABNORMAL LOW (ref 0.61–1.24)
GFR, Estimated: >60 mL/min (ref >60)
Glucose, Bld: 107 mg/dL — ABNORMAL HIGH (ref 70–99)
Potassium: 3.9 mmol/L (ref 3.5–5.1)
Sodium: 132 mmol/L — ABNORMAL LOW (ref 135–145)

## 2021-12-03 MED ORDER — IPRATROPIUM-ALBUTEROL 0.5-2.5 (3) MG/3ML IN SOLN: 3.0000 mL | Freq: Four times a day (QID) | RESPIRATORY_TRACT | 3 refills | Status: AC | PRN

## 2021-12-03 MED ORDER — ASPIRIN 81 MG PO TBEC: 81.0000 mg | DELAYED_RELEASE_TABLET | Freq: Every day | ORAL | 12 refills | Status: DC

## 2021-12-03 MED ORDER — DOCUSATE SODIUM 100 MG PO CAPS
100.0000 mg | ORAL_CAPSULE | Freq: Two times a day (BID) | ORAL | 0 refills | Status: AC | PRN
Start: 2021-12-03 — End: ?

## 2021-12-03 MED ORDER — BETHANECHOL CHLORIDE 5 MG PO TABS: 5.0000 mg | ORAL_TABLET | Freq: Three times a day (TID) | ORAL | 1 refills | Status: DC

## 2021-12-03 MED ORDER — ROSUVASTATIN CALCIUM 20 MG PO TABS: 20.0000 mg | ORAL_TABLET | Freq: Every day | ORAL | 1 refills | Status: DC

## 2021-12-03 MED ORDER — POLYETHYLENE GLYCOL 3350 17 G PO PACK
17.0000 g | PACK | Freq: Every day | ORAL | 0 refills | Status: AC | PRN
Start: 2021-12-03 — End: ?

## 2021-12-03 MED ORDER — OXYCODONE HCL 5 MG PO TABS: 5.0000 mg | ORAL_TABLET | ORAL | 0 refills | Status: AC | PRN

## 2021-12-03 MED FILL — Midazolam HCl Inj 2 MG/2ML (Base Equivalent): INTRAMUSCULAR | Qty: 2 | Status: AC

## 2021-12-03 NOTE — Consult Note (Deleted)
PATIENT WAS NOT SEEN BY ATTENDING AS HE WAS DISCHARGED PRIOR TO BEING SEEN. HE WAS NOT CHARGED FOR CONSULT AND WILL BE SEEN IN THE OUTPATIENT SETTING.     [place] VALVE TEAM  Cardiology Consultation:   Patient ID: Ronnie Snyder MRN: [id number]; DOB: 05/23/1940  Admit date: 11/25/2021 Date of Consult: 12/03/2021  Primary Care Provider: Luetta Nutting, DO [facility] Cardiologist: None  [facility] Electrophysiologist:  None    Patient Profile:   Ronnie Snyder is a 81 y.o. male with a hx of PAD s/p prior intervention of left lower extremity, AAA, left common iliac artery aneurysm, left common femoral aneurysm, carotid artery disease, stroke, protein calorie malnutrition, HTN, HLD, hypothyroidism, anemia, COPD with long term heavy tobacco abuse, GERD, and hepatic steatosis who is being seen today for the evaluation of severe AS at the request of Dr. Stanford Breed.  History of Present Illness:   [name] lives in [place] with his wife.  He had two sons but one passed away recently from a possible aneurysm.  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  He helps take care of his wife who has multiple medical problems including COPD and mental illness.  His son is very involved in their lives and helps to grocery shopping and take care of his wife.  At baseline the patient walks with a cane.  He is very sedentary, mostly due to lower extremity claudication.  The most exercise he gets is walking to his car to pick up their meals for the day.  He cannot walk at the grocery store due to leg pain and his son does all of the grocery shopping.  He has intermittently smoked since the age of 21.  He has cut back more recently due to his wife's COPD.  He has chronic back pain and had 2 previous back surgeries.  Of note, he has not seen the dentist in some time and has several bottom native  teeth and full upper plates.  He was remotely seen by Dr. Burt Knack for peripheral vascular disease.  Since that time he was referred to vascular surgery. He ultimately underwent a left iliofemoral endarterectomy with patch angioplasty in 01/2011 and then a left femoral popliteal atherectomy with stenting in 09/2012. He was lost to follow-up after this until early 2021 when he returned and reported bilateral claudication. ABI was  severely reduced at 0.59 on the right (with no palpable pulse) but normal on the left. Repeat angiography was discussed but patient declined as he was taking care of his wife. He was started on Pletal. In addition to his lower extremity disease, he also has known carotid artery disease, AAA, common iliac artery aneurysm, and left common femoral aneurysm all of which vascular surgery is following.  He was in his usual state of poor health until 11/25/2021 when he presented to [facility] ED after a fall from his porch. Unclear if this was syncope as patient doesn't remember clearly and cannot give a good recollection. He was found to be in acute respiratory failure with O2 sats of 30% upon arrival felt to be secondary to community acquired pneumonia, COPD and mild heart failure.  He was treated with Bipap, HFNC, antibiotics as well as IV Lasix with significant improvement. He suffered a displaced right humeral fracture and cannot feel his right arm//hand, which is the arm he uses  for his cane.  Ortho has seen during admission and recommends conservative management.  Additionally, the fall has severely exacerbated his chronic back back pain.  TTE 11/26/21 showed EF 50-55%, G2DD, moderate pulmonary hypertension with RVSP of 45.9 mmHg, mild MR/MS, elevated left atrial pressure, and paradoxical low-flow low gradient aortic stenosis with a mean gradient of 31, aortic valve area of 0.7 cm, DVI 0.21, SVI 35 cc/m.   [facility] 11/30/21 showed diffuse, calcific coronary artery disease which does  not appear obstructive, there is a small PDA with a severe lesion but this would not be a target for revascularization and severe AS with a mean gradient of 30 mm hg and AVA 0.93 cm2. LVEDP normal. Additionally, there was severely calcified right common femoral and iliac system.  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   CTA dissection protocol in the ER showed a PAD with aneurysmal findings in the abdominal aorta, LCIA, and LCFA- unchanged from previous exams as well as pleural effusions and multiple wedge deformities of the lumbar vertebral bodies. CT angio chest was negative for PE.  Structural heart consultation was requested to see if he is a TAVR candidate.  The patient is currently sitting in a chair alone.  He is writhing in back pain.  He has to stop talking for several moments to breathe through the pain.  He denies chest pain or shortness of breath.  He said he did not know "he was ever short of breath until he came to the hospital and his oxygen saturations were so low."  He denies lower extremity edema, orthopnea or PND.  He has chronic back pain which is now worse since his fall.  He cannot feel his right arm or hand which is in a sling after his right humeral neck fracture.  Plans are for discharge to a rehab facility today. He denies previous exertional dizziness or syncope except for possible event leading to admission. He just states that he has never felt this bad before and "doesn't know what to do."  Of note, after I saw the patient he was seen by palliative care who believes he has approximately 6-12 months and have established outpatient palliative care services. Patient stated that he would not want to be kept alive on machines, but has not discussed CODE STATUS with his family  Past Medical History:  Diagnosis Date   Abdominal aortic aneurysm ([place])    3.5 cm 10/2016 L-spine CT (previously 3.0 cm by U/S 02/25/11)   Anxiety    Arthritis    "in my feet"  (10/13/2012)   GERD (gastroesophageal reflux disease)    H/O hiatal hernia    Hepatic steatosis    High cholesterol    "at one time; it's fine now" (10/13/2012)   HTN (hypertension)    Hypothyroidism    PAD (peripheral artery disease) ([place])    Skin cancer    "burned them off my arm and such" (10/13/2012)   Stroke [facility])    Tubular adenoma of colon 10/2019    Past Surgical History:  Procedure Laterality Date   ABDOMINAL ANGIOGRAM N/A 12/04/2011   Procedure: ABDOMINAL ANGIOGRAM;  Surgeon: Sherren Mocha, MD;  Location: [facility] CATH LAB;  Service: Cardiovascular;  Laterality: N/A;   ABDOMINAL AORTAGRAM N/A 10/13/2012   Procedure: ABDOMINAL Maxcine Ham;  Surgeon: Serafina Mitchell, MD;  Location: [facility] CATH LAB;  Service: Cardiovascular;  Laterality: N/A;   ANGIOPLASTY / STENTING FEMORAL Left 10/13/2012   BIOPSY  11/11/2019  Procedure: BIOPSY;  Surgeon: Yetta Flock, MD;  Location: [place];  Service: Gastroenterology;;   COLONOSCOPY WITH PROPOFOL N/A 11/11/2019   Procedure: COLONOSCOPY WITH PROPOFOL;  Surgeon: Yetta Flock, MD;  Location: [place];  Service: Gastroenterology;  Laterality: N/A;   ESOPHAGOGASTRODUODENOSCOPY     ESOPHAGOGASTRODUODENOSCOPY (EGD) WITH PROPOFOL N/A 11/11/2019   Procedure: ESOPHAGOGASTRODUODENOSCOPY (EGD) WITH PROPOFOL;  Surgeon: Yetta Flock, MD;  Location: [place];  Service: Gastroenterology;  Laterality: N/A;   FEMORAL ENDARTERECTOMY Left 01/23/12   Left Endarterectomy  with bovie patch Angioplasy   gated spect wall motion stress cardiolite  02/10/2002   HEMOSTASIS CLIP PLACEMENT  11/11/2019   Procedure: HEMOSTASIS CLIP PLACEMENT;  Surgeon: Yetta Flock, MD;  Location: [place] ENDOSCOPY;  Service: Gastroenterology;;   HEMOSTASIS CONTROL  11/11/2019   Procedure: HEMOSTASIS CONTROL;  Surgeon: Yetta Flock, MD;  Location: [facility] ENDOSCOPY;  Service: Gastroenterology;;   HIP FRACTURE SURGERY Right 1990's   HOT HEMOSTASIS N/A 11/11/2019    Procedure: HOT HEMOSTASIS (ARGON PLASMA COAGULATION/BICAP);  Surgeon: Yetta Flock, MD;  Location: [facility] ENDOSCOPY;  Service: Gastroenterology;  Laterality: N/A;   INGUINAL HERNIA REPAIR Right    "years ago" (10/13/2012)   IR KYPHO EA ADDL LEVEL THORACIC OR LUMBAR  06/07/2021   IR KYPHO LUMBAR INC FX REDUCE BONE BX UNI/BIL CANNULATION INC/IMAGING  06/07/2021   LUMBAR LAMINECTOMY/DECOMPRESSION MICRODISCECTOMY N/A 05/23/2017   Procedure: L4-5 DECOMPRESSION;  Surgeon: Marybelle Killings, MD;  Location: [place];  Service: Orthopedics;  Laterality: N/A;   NOSE SURGERY     POLYPECTOMY  11/11/2019   Procedure: POLYPECTOMY;  Surgeon: Yetta Flock, MD;  Location: [facility] ENDOSCOPY;  Service: Gastroenterology;;   RIGHT/LEFT HEART CATH AND CORONARY ANGIOGRAPHY N/A 11/30/2021   Procedure: RIGHT/LEFT HEART CATH AND CORONARY ANGIOGRAPHY;  Surgeon: Jettie Booze, MD;  Location: [place] CV LAB;  Service: Cardiovascular;  Laterality: N/A;   TONSILLECTOMY     "I was a chld" (10/13/2012)     Home Medications:  Prior to Admission medications   Medication Sig Start Date End Date Taking? Authorizing Provider  acetaminophen (TYLENOL) 500 MG tablet Take 1,000 mg by mouth 2 (two) times daily.   Yes [provider]  amLODipine (NORVASC) 10 MG tablet TAKE 1 TABLET(10 MG) BY MOUTH DAILY Patient taking differently: Take 10 mg by mouth daily. 11/22/21  Yes Luetta Nutting, DO  Cholecalciferol (VITAMIN D3 GUMMIES PO) Take 1 tablet by mouth every morning.   Yes [provider]  FEROSUL 325 (65 Fe) MG tablet TAKE 1 TABLET(325 MG) BY MOUTH DAILY WITH BREAKFAST Patient taking differently: Take 325 mg by mouth daily with breakfast. 07/31/21  Yes Luetta Nutting, DO  folic acid (FOLVITE) 1 MG tablet Take 1 mg by mouth every morning.   Yes [provider]  HYDROcodone-acetaminophen (NORCO/VICODIN) 5-325 MG tablet Take 1 tablet by mouth every 6 (six) hours as needed for moderate pain. Patient taking  differently: Take 0.5 tablets by mouth every morning. 11/12/21  Yes Marybelle Killings, MD  levothyroxine (SYNTHROID) 75 MCG tablet TAKE 1 TABLET(75 MCG) BY MOUTH DAILY BEFORE AND BREAKFAST Patient taking differently: Take 75 mcg by mouth daily before breakfast. 09/05/21  Yes Luetta Nutting, DO  losartan (COZAAR) 100 MG tablet TAKE 1 TABLET(100 MG) BY MOUTH DAILY Patient taking differently: Take 100 mg by mouth every morning. 10/29/21  Yes Luetta Nutting, DO  methotrexate (RHEUMATREX) 2.5 MG tablet Take 20 mg by mouth every Monday. Caution:Chemotherapy. Protect from light.   Yes  [provider]  mineral oil liquid Take 5 mLs by mouth every evening.   Yes [provider]  pantoprazole (PROTONIX) 40 MG tablet TAKE 1 TABLET(40 MG) BY MOUTH DAILY. NEED OFFICE VISIT FOR REFILLS. Patient taking differently: Take 40 mg by mouth daily. 08/16/21  Yes Luetta Nutting, DO  tamsulosin (FLOMAX) 0.4 MG CAPS capsule TAKE 1 CAPSULE(0.4 MG) BY MOUTH DAILY Patient taking differently: Take 0.4 mg by mouth in the morning. 08/09/21  Yes Luetta Nutting, DO  aspirin EC 81 MG tablet Take 1 tablet (81 mg total) by mouth daily. Swallow whole. 12/04/21   Hosie Poisson, MD  bethanechol (URECHOLINE) 5 MG tablet Take 1 tablet (5 mg total) by mouth 3 (three) times daily. 12/03/21   Hosie Poisson, MD  docusate sodium (COLACE) 100 MG capsule Take 1 capsule (100 mg total) by mouth 2 (two) times daily as needed for mild constipation. 12/03/21   Hosie Poisson, MD  ipratropium-albuterol (DUONEB) 0.5-2.5 (3) MG/3ML SOLN Take 3 mLs by nebulization every 6 (six) hours as needed. 12/03/21   Hosie Poisson, MD  oxyCODONE (OXY IR/ROXICODONE) 5 MG immediate release tablet Take 1 tablet (5 mg total) by mouth every 4 (four) hours as needed for up to 3 days for severe pain. 12/03/21 12/06/21  Hosie Poisson, MD  polyethylene glycol (MIRALAX / GLYCOLAX) 17 g packet Take 17 g by mouth daily as needed for moderate constipation. 12/03/21   Hosie Poisson, MD  rosuvastatin (CRESTOR) 20 MG tablet Take 1 tablet (20 mg total) by mouth daily. 12/04/21   Hosie Poisson, MD    Inpatient Medications: Scheduled Meds:  aspirin EC  81 mg Oral Daily   bethanechol  5 mg Oral TID   folic acid  1 mg Oral Daily   heparin  5,000 Units Subcutaneous Q8H   levothyroxine  75 mcg Oral Q0600   lidocaine  1 patch Transdermal Q24H   mouth rinse  15 mL Mouth Rinse 4 times per day   rosuvastatin  20 mg Oral Daily   sodium chloride flush  3 mL Intravenous Q12H   tamsulosin  0.4 mg Oral Daily   Continuous Infusions:  sodium chloride Stopped (11/27/21 0746)   sodium chloride     PRN Meds: sodium chloride, sodium chloride, acetaminophen, acetaminophen, docusate sodium, fentaNYL (SUBLIMAZE) injection, ipratropium-albuterol, labetalol, menthol-cetylpyridinium, ondansetron (ZOFRAN) IV, mouth rinse, oxyCODONE, polyethylene glycol, sodium chloride flush, traMADol  Allergies:    Allergies  Allergen Reactions   Hydrocodone-Acetaminophen Nausea Only and Other (See Comments)    Vertigo, also   Atorvastatin Nausea Only   Other Nausea Only and Other (See Comments)    Vertigo and nausea: "ALL PAINS/NARCOTIC MEDS-CANNOT TOLERATE WELL,"  per Med History prior to 05/23/17   Penicillins Rash    Has patient had a PCN reaction causing immediate rash, facial/tongue/throat swelling, SOB or lightheadedness with hypotension: Unknown Has patient had a PCN reaction causing severe rash involving mucus membranes or skin necrosis: No Has patient had a PCN reaction that required hospitalization: No Has patient had a PCN reaction occurring within the last 10 years: No If all of the above answers are "NO", then may proceed with Cephalosporin use.    Pioglitazone Nausea Only    Social History:   Social History   Socioeconomic History   Marital status: Married    Spouse name: Not on file   Number of children: 2   Years of education: 12   Highest education level: Not on file   Occupational History  Occupation: retired    Comment: optician  Tobacco Use   Smoking status: Every Day    Packs/day: 0.25    Years: 57.00    Total pack years: 14.25    Types: Cigarettes   Smokeless tobacco: Never   Tobacco comments:    pt states there is no hope for quitting, pt states he smokes 1-2 cigarettes a day, wife has copd and doesnt smoke around her  Vaping Use   Vaping Use: Never used  Substance and Sexual Activity   Alcohol use: Not Currently   Drug use: No   Sexual activity: Not on file  Other Topics Concern   Not on file  Social History Narrative   ** Merged History Encounter **       Lives with wife, is her caregiver   Social Determinants of Radio broadcast assistant Strain: Not on file  Food Insecurity: Not on file  Transportation Needs: Not on file  Physical Activity: Not on file  Stress: Not on file  Social Connections: Not on file  Intimate Partner Violence: Not on file    Family History:   Family History  Problem Relation Age of Onset   Hypertension Mother    Heart disease Mother    Hypertension Father    Heart disease Father    Hypertension Sister    Hypertension Sister    Colon cancer Neg Hx    Esophageal cancer Neg Hx    Pancreatic cancer Neg Hx    Stomach cancer Neg Hx      ROS:  Please see the history of present illness.  All other ROS reviewed and negative.     Physical Exam/Data:   Vitals:   12/01/21 2107 12/02/21 0416 12/02/21 2254 12/03/21 0300  BP: (!) 150/70 136/70  (!) 147/73  Pulse: 69 69 71 68  Resp: (!) '23 20 15 18  '$ Temp: (!) 97.4 F (36.3 C) 97.6 F (36.4 C) 97.9 F (36.6 C) 97.8 F (36.6 C)  TempSrc: Oral Oral Oral Oral  SpO2: 92%  93% 93%  Weight:    50.3 kg  Height:        Intake/Output Summary (Last 24 hours) at 12/03/2021 1315 Last data filed at 12/03/2021 1051 Gross per 24 hour  Intake 50 ml  Output 500 ml  Net -450 ml      12/03/2021    3:00 AM 12/01/2021    5:00 AM 11/30/2021    5:56 AM   Last 3 Weights  Weight (lbs) 110 lb 14.3 oz 103 lb 9.9 oz 103 lb 13.4 oz  Weight (kg) 50.3 kg 47 kg 47.1 kg     Body mass index is 17.37 kg/m.  General:  chronically ill appearing white male HEENT: normal Lymph: no adenopathy Neck: no JVD Cardiac:  normal S1, S2; RRR; 2/6 SEM. Lungs: short inspiration  Abd: soft, nontender, no hepatomegaly  Ext: no edema Musculoskeletal:  No deformities, BUE and BLE strength normal and equal Skin: warm and dry  Neuro:  CNs 2-12 intact, no focal abnormalities noted Psych:  Normal affect   EKG:  The EKG was personally reviewed and demonstrates:  sinus tach HR 108 with LAFB Telemetry:  Telemetry was personally reviewed and demonstrates: sinus   Relevant CV Studies: Echocardiogram 11/26/2021: Impressions:  1. Left ventricular ejection fraction, by estimation, is 50 to 55%. The  left ventricle has low normal function. The left ventricle has no regional  wall motion abnormalities. There is mild left ventricular hypertrophy.  Left ventricular diastolic  parameters are consistent with Grade II diastolic dysfunction  (pseudonormalization). Elevated left atrial pressure.   2. Right ventricular systolic function is normal. The right ventricular  size is normal. There is moderately elevated pulmonary artery systolic  pressure. The estimated right ventricular systolic pressure is 10.2 mmHg.   3. Left atrial size was moderately dilated.   4. The mitral valve is degenerative. Mild mitral valve regurgitation.  Mild mitral stenosis. The mean mitral valve gradient is 5.0 mmHg with  average heart rate of 102 bpm. Severe mitral annular calcification.   5. The inferior vena cava is dilated in size with <50% respiratory  variability, suggesting right atrial pressure of 15 mmHg.   6. The aortic valve was not well visualized. There is severe calcifcation  of the aortic valve. Aortic valve regurgitation is not visualized. Severe  aortic valve stenosis. AS moderate  by gradients (Vmax 3.6 m/s, MG 31  mmHg), severe by AVA (0.7 cm^2) and  DI (0.21). Low SV index (35 cc/m^2), suspect paradoxical low flow low  gradient severe AS    _______________________  Cath 11/30/21   2nd Diag lesion is 50% stenosed.   RPDA lesion is 80% stenosed.   Dist LM lesion is 25% stenosed.   The left ventricular systolic function is normal.   LV end diastolic pressure is normal.   The left ventricular ejection fraction is 55-65% by visual estimate.   There is severe aortic valve stenosis.  Calculated aortic valve area 0.93 cm2.  Mean gradient 30 mmHg   Aortic saturation 98%, PA saturation 69%, PA pressure 30/7, mean PA pressure 15 mmHg, mean pulmonary capillary wedge pressure 2 mmHg, cardiac output 4.91 L/min, cardiac index 3.21.   Severely calcified right common femoral and iliac system.  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  Diffuse, calcific coronary artery disease which does not appear obstructive.  There is a small PDA with a severe lesion but this would not be a target for revascularization.   Severe aortic stenosis by calculated valve area.  Further plans per the TAVR team  Laboratory Data:  High Sensitivity Troponin:   Recent Labs  Lab 11/25/21 1740 11/25/21 1957 11/25/21 2313 11/26/21 0303  TROPONINIHS 136* 89* 317* 471*     Chemistry Recent Labs  Lab 11/30/21 0423 11/30/21 0950 11/30/21 1003 11/30/21 1416 12/02/21 0333 12/03/21 1000  NA 134*   < > 131*  131*  --  131* 132*  K 4.2   < > 4.1  4.1  --  4.0 3.9  CL 91*  --   --   --  90* 89*  CO2 38*  --   --   --  34* 37*  GLUCOSE 109*  --   --   --  113* 107*  BUN 9  --   --   --  7* 6*  CREATININE 0.33*  --   --  0.35* 0.34* 0.40*  CALCIUM 8.5*  --   --   --  8.2* 8.5*  GFRNONAA >60  --   --  >60 >60 >60  ANIONGAP 5  --   --   --  7 6   < > = values in this interval not displayed.    Recent Labs  Lab 11/30/21 0423  PROT 5.4*  ALBUMIN 2.6*  AST 30  ALT 58*   ALKPHOS 73  BILITOT 0.5   Hematology Recent Labs  Lab 11/30/21 0423 11/30/21 0950 11/30/21 1003  11/30/21 1416 12/01/21 1816 12/02/21 0333  WBC 11.1*  --   --  12.6*  --  12.0*  RBC 3.13*  --   --  3.08* 2.69* 2.91*  HGB 10.5*   < > 9.9*  10.2* 10.4*  --  9.8*  HCT 32.6*   < > 29.0*  30.0* 31.2*  --  28.9*  MCV 104.2*  --   --  101.3*  --  99.3  MCH 33.5  --   --  33.8  --  33.7  MCHC 32.2  --   --  33.3  --  33.9  RDW 15.1  --   --  14.7  --  14.7  PLT 185  --   --  194  --  169   < > = values in this interval not displayed.   BNP Recent Labs  Lab 11/27/21 0157  BNP 433.1*    DDimer No results for input(s): "DDIMER" in the last 168 hours.   Radiology/Studies:  CARDIAC CATHETERIZATION  Addendum Date: 11/30/2021     2nd Diag lesion is 50% stenosed.   RPDA lesion is 80% stenosed.   Dist LM lesion is 25% stenosed.   The left ventricular systolic function is normal.   LV end diastolic pressure is normal.   The left ventricular ejection fraction is 55-65% by visual estimate.   There is severe aortic valve stenosis.  Calculated aortic valve area 0.93 cm2.  Mean gradient 30 mmHg   Aortic saturation 98%, PA saturation 69%, PA pressure 30/7, mean PA pressure 15 mmHg, mean pulmonary capillary wedge pressure 2 mmHg, cardiac output 4.91 L/min, cardiac index 3.21. Severely calcified right common femoral and iliac system.  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 Diffuse, calcific coronary artery disease which does not appear obstructive.  There is a small PDA with a severe lesion but this would not be a target for revascularization. Severe aortic stenosis by calculated valve area.  Further plans per the TAVR team.  Result Date: 11/30/2021   2nd Diag lesion is 50% stenosed.   RPDA lesion is 80% stenosed.   Dist LM lesion is 25% stenosed.   The left ventricular systolic function is normal.   LV end diastolic pressure is normal.   The left ventricular ejection fraction is  55-65% by visual estimate.   There is severe aortic valve stenosis.  Calculated aortic valve area 0.93 cm2.  Mean gradient 30 mmHg   Aortic saturation 98%, PA saturation 69%, PA pressure 30/7, mean PA pressure 15 mmHg, mean pulmonary capillary wedge pressure 2 mmHg, cardiac output 4.91 L/min, cardiac index 3.21. Severely calcified right common femoral and iliac system.  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 Diffuse, calcific coronary artery disease which does not appear obstructive.  There is a small PDA with a severe lesion but this would not be a target for revascularization. Severe aortic stenosis by calculated valve area.  Further plans per the TAVR team.     STS Risk Calculator: Procedure: Isolated AVR Risk of Mortality: 10.402% Renal Failure: 3.470% Permanent Stroke: 5.295% Prolonged Ventilation: 36.962% DSW Infection: 0.116% Reoperation: 6.321% Morbidity or Mortality: 45.248% Short Length of Stay: 10.174% Long Length of Stay: 30.734%  _________________________  [facility] Cardiomyopathy Questionnaire     12/03/2021   12:42 PM  KCCQ-12  1 a. Ability to shower/bathe Moderately limited  1 b. Ability to walk 1 block Other, Did not do  1 c. Ability  to hurry/jog Other, Did not do  2. Edema feet/ankles/legs Never over the past 2 weeks  3. Limited by fatigue All of the time  4. Limited by dyspnea Never over the past 2 weeks  5. Sitting up / on 3+ pillows Never over the past 2 weeks  6. Limited enjoyment of life Extremely limited  7. Rest of life w/ symptoms Not at all satisfied  8 a. Participation in hobbies N/A, did not do for other reasons  8 b. Participation in chores N/A, did not do for other reasons  8 c. Visiting family/friends N/A, did not do for other reasons     Assessment and Plan:   Ronnie Snyder is a 81 y.o. male with symptoms of severe, stage D3 aortic stenosis with NYHA Class III symptoms currently admitted for PNA, AECOPD and  mild acute CHF. He has improved with oxygen, diuresis and ABx.  TTE 11/26/21 showed EF 50-55%, G2DD, moderate pulmonary hypertension with RVSP of 45.9 mmHg, mild MR/MS, elevated left atrial pressure, and paradoxical low-flow low gradient aortic stenosis with a mean gradient of 31, aortic valve area of 0.7 cm, DVI 0.21, SVI 35 cc/m.   [facility] 11/30/21 showed diffuse, calcific coronary artery disease which does not appear obstructive, there is a small PDA with a severe lesion but this would not be a target for revascularization and severe AS with a mean gradient of 30 mm hg and AVA 0.93 cm2. LVEDP normal. Additionally, there was severely calcified right common femoral and iliac system.  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   CTA dissection protocol in the ER showed a PAD with aneurysmal findings in the abdominal aorta, LCIA, and LCFA- unchanged from previous exams as well as pleural effusions and multiple wedge deformities of the lumbar vertebral bodies. CT angio chest was negative for PE.  I have reviewed the natural history of aortic stenosis with the patient. We have discussed the limitations of medical therapy and the poor prognosis associated with symptomatic aortic stenosis. We have reviewed potential treatment options, including palliative medical therapy, conventional surgical aortic valve replacement, and transcatheter aortic valve replacement. We discussed treatment options in the context of this patient's specific comorbid medical conditions.   Of note, after I saw the patient he was seen by palliative care who believes he has approximately 6-12 months and have established outpatient palliative care services. Patient stated that he would not want to be kept alive on machines, but has not discussed CODE STATUS with his family   The patient's predicted risk of mortality with conventional aortic valve replacement is 10.402% primarily based on age, severe PAD s/p prior  intervention of left lower extremity, stroke, protein calorie malnutrition, HTN, HLD, anemia, COPD with long term heavy tobacco abuse and possible syncope. Other significant comorbid conditions include extremely low functional capacity and frailty. The patient is interested in TAVR if it could help him.  The patient has a very poor functional capacity at baseline, mostly secondary to his severe PAD with claudication.  He now has suffered a mechanical fall resulting in a humeral fracture which will significantly worsen his already poor functional status.  He is right-hand dominant and uses this arm to maneuver his cane.  He has not been able to walk since being admitted.  Additionally, he has severe PAD and I think it is very unlikely that he would have good access for TAVR.  I am not sure he would survive a transapical approach. I  am not sure that TAVR will overall change the trajectory of his course. The patient is planning for discharge today to short-term rehab.  However, if he does not make significant progress he is aware that he may not be able to return home.   PATIENT WAS NOT SEEN BY ATTENDING AS HE WAS DISCHARGED PRIOR TO BEING SEEN. HE WAS NOT CHARGED FOR CONSULT AND WILL BE SEEN IN THE OUTPATIENT SETTING.   Signed, Angelena Form, PA-C  12/03/2021 1:15 PM

## 2021-12-03 NOTE — Progress Notes (Signed)
Progress Note  Patient Name: Ronnie Snyder Date of Encounter: 12/03/2021  [facility] Cardiologist: Dr Debara Pickett  Subjective   No CP or dyspnea; complains of pain in right arm from recent fracture.  Inpatient Medications    Scheduled Meds:  aspirin EC  81 mg Oral Daily   bethanechol  5 mg Oral TID   folic acid  1 mg Oral Daily   heparin  5,000 Units Subcutaneous Q8H   levothyroxine  75 mcg Oral Q0600   lidocaine  1 patch Transdermal Q24H   mouth rinse  15 mL Mouth Rinse 4 times per day   rosuvastatin  20 mg Oral Daily   sodium chloride flush  3 mL Intravenous Q12H   tamsulosin  0.4 mg Oral Daily   Continuous Infusions:  sodium chloride Stopped (11/27/21 0746)   sodium chloride     PRN Meds: sodium chloride, sodium chloride, acetaminophen, acetaminophen, docusate sodium, fentaNYL (SUBLIMAZE) injection, ipratropium-albuterol, labetalol, menthol-cetylpyridinium, ondansetron (ZOFRAN) IV, mouth rinse, oxyCODONE, polyethylene glycol, sodium chloride flush, traMADol   Vital Signs    Vitals:   12/01/21 2107 12/02/21 0416 12/02/21 2254 12/03/21 0300  BP: (!) 150/70 136/70  (!) 147/73  Pulse: 69 69 71 68  Resp: (!) '23 20 15 18  '$ Temp: (!) 97.4 F (36.3 C) 97.6 F (36.4 C) 97.9 F (36.6 C) 97.8 F (36.6 C)  TempSrc: Oral Oral Oral Oral  SpO2: 92%  93% 93%  Weight:    50.3 kg  Height:        Intake/Output Summary (Last 24 hours) at 12/03/2021 0752 Last data filed at 12/02/2021 2346 Gross per 24 hour  Intake 50 ml  Output 400 ml  Net -350 ml      12/03/2021    3:00 AM 12/01/2021    5:00 AM 11/30/2021    5:56 AM  Last 3 Weights  Weight (lbs) 110 lb 14.3 oz 103 lb 9.9 oz 103 lb 13.4 oz  Weight (kg) 50.3 kg 47 kg 47.1 kg      Telemetry    Sinus with rare PVC- Personally Reviewed  Physical Exam   GEN: No acute distress.  Frail Neck: No JVD Cardiac: RRR, 2/6 systolic mummur Respiratory: Diminished breath sounds throughout GI: Soft, nontender,  non-distended  MS: No edema Neuro:  Nonfocal  Psych: Normal affect   Labs    High Sensitivity Troponin:   Recent Labs  Lab 11/25/21 1740 11/25/21 1957 11/25/21 2313 11/26/21 0303  TROPONINIHS 136* 89* 317* 471*     Chemistry Recent Labs  Lab 11/27/21 0157 11/28/21 0425 11/30/21 0423 11/30/21 0950 11/30/21 1003 11/30/21 1416 12/02/21 0333  NA 140 136 134* 130* 131*  131*  --  131*  K 3.2* 4.6 4.2 4.1 4.1  4.1  --  4.0  CL 97* 94* 91*  --   --   --  90*  CO2 34* 34* 38*  --   --   --  34*  GLUCOSE 118* 95 109*  --   --   --  113*  BUN 25* 20 9  --   --   --  7*  CREATININE 0.56* <0.30* 0.33*  --   --  0.35* 0.34*  CALCIUM 8.6* 8.5* 8.5*  --   --   --  8.2*  MG 1.5* 1.8  --   --   --   --  1.5*  PROT  --   --  5.4*  --   --   --   --  ALBUMIN  --   --  2.6*  --   --   --   --   AST  --   --  30  --   --   --   --   ALT  --   --  58*  --   --   --   --   ALKPHOS  --   --  73  --   --   --   --   BILITOT  --   --  0.5  --   --   --   --   GFRNONAA >60 NOT CALCULATED >60  --   --  >60 >60  ANIONGAP '9 8 5  '$ --   --   --  7    Lipids No results for input(s): "CHOL", "TRIG", "HDL", "LABVLDL", "LDLCALC", "CHOLHDL" in the last 168 hours.  Hematology Recent Labs  Lab 11/30/21 0423 11/30/21 0950 11/30/21 1003 11/30/21 1416 12/01/21 1816 12/02/21 0333  WBC 11.1*  --   --  12.6*  --  12.0*  RBC 3.13*  --   --  3.08* 2.69* 2.91*  HGB 10.5*   < > 9.9*  10.2* 10.4*  --  9.8*  HCT 32.6*   < > 29.0*  30.0* 31.2*  --  28.9*  MCV 104.2*  --   --  101.3*  --  99.3  MCH 33.5  --   --  33.8  --  33.7  MCHC 32.2  --   --  33.3  --  33.9  RDW 15.1  --   --  14.7  --  14.7  PLT 185  --   --  194  --  169   < > = values in this interval not displayed.    BNP Recent Labs  Lab 11/27/21 0157  BNP 433.1*     Cardiac Studies   Echo 11/26/21   1. Left ventricular ejection fraction, by estimation, is 50 to 55%. The  left ventricle has low normal function. The left  ventricle has no regional  wall motion abnormalities. There is mild left ventricular hypertrophy.  Left ventricular diastolic  parameters are consistent with Grade II diastolic dysfunction  (pseudonormalization). Elevated left atrial pressure.   2. Right ventricular systolic function is normal. The right ventricular  size is normal. There is moderately elevated pulmonary artery systolic  pressure. The estimated right ventricular systolic pressure is 93.2 mmHg.   3. Left atrial size was moderately dilated.   4. The mitral valve is degenerative. Mild mitral valve regurgitation.  Mild mitral stenosis. The mean mitral valve gradient is 5.0 mmHg with  average heart rate of 102 bpm. Severe mitral annular calcification.   5. The inferior vena cava is dilated in size with <50% respiratory  variability, suggesting right atrial pressure of 15 mmHg.   6. The aortic valve was not well visualized. There is severe calcifcation  of the aortic valve. Aortic valve regurgitation is not visualized. Severe  aortic valve stenosis. AS moderate by gradients (Vmax 3.6 m/s, MG 31  mmHg), severe by AVA (0.7 cm^2) and  DI (0.21). Low SV index (35 cc/m^2), suspect paradoxical low flow low  gradient severe AS   Cath 11/30/21    2nd Diag lesion is 50% stenosed.   RPDA lesion is 80% stenosed.   Dist LM lesion is 25% stenosed.   The left ventricular systolic function is normal.   LV end diastolic pressure is normal.  The left ventricular ejection fraction is 55-65% by visual estimate.   There is severe aortic valve stenosis.  Calculated aortic valve area 0.93 cm2.  Mean gradient 30 mmHg   Aortic saturation 98%, PA saturation 69%, PA pressure 30/7, mean PA pressure 15 mmHg, mean pulmonary capillary wedge pressure 2 mmHg, cardiac output 4.91 L/min, cardiac index 3.21.   Severely calcified right common femoral and iliac system.  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  Diffuse, calcific coronary  artery disease which does not appear obstructive.  There is a small PDA with a severe lesion but this would not be a target for revascularization.   Severe aortic stenosis by calculated valve area.  Further plans per the TAVR team.  Patient Profile     81 y.o. male with history of PAD s/p prior intervention of left lower extremity,  AAA, left common iliac artery aneurysm, left common femoral aneurysm, carotid artery disease, stroke, hypertension, hyperlipidemia, hypothyroidism, GERD, and hepatic steatosis who presented with fall and possible presyncope found to be in respiratory failure with sats 30% on arrival to the ED thought to be due to community acquired pneumonia and COPD. TTE here demonstrated severe AS with AVA 0.7cm2, DI 0.21 with mean gradient 75mHg (paradoxical low flow, low gradient AS) for which cardiology was consulted.   Assessment & Plan    1 severe aortic stenosis-noted on echocardiogram this admission.  Plan is to have the structural team evaluate for possible TAVR.  He will likely need to recover from recent events before this would be considered.  2 coronary artery disease-continue aspirin and statin.  3 respiratory failure-has improved since admission.  This is felt secondary to possible pneumonia/COPD.  Note right heart catheterization with low filling pressures.  Will not diurese further.  Also note CTA showed no pulmonary embolus.  4 abdominal aortic aneurysm/left common iliac artery aneurysm/left common femoral aneurysm-followed by vascular surgery.  5 status post right humeral fracture-management per orthopedics.  6 hypertension-patient's blood pressure is controlled with no medications.  7 hyperlipidemia-continue statin.  For questions or updates, please contact [place]Please consult [email or web address] for contact info under        Signed, BKirk Ruths MD  12/03/2021, 7:52 AM

## 2021-12-03 NOTE — Discharge Summary (Signed)
Physician Discharge Summary   Patient: Ronnie Snyder MRN: [id number] DOB: 1941-05-05  Admit date:     11/25/2021  Discharge date: 12/03/21  Discharge Physician: Hosie Poisson   PCP: Luetta Nutting, DO   Recommendations at discharge:  Please follow up with [facility] palliative care services on discharge.  Please follow up with cardiology/ to evaluate for TAVR as outpatient.  Please follow up with PCp in one week.  Please check cbc and bmp in one week.  Please follow up with Orthopedics in 4 weeks with Dr Onnie Graham.  Please follow up with pulmonology as outpatient for his COPD.   Discharge Diagnoses: Principal Problem:   Acute respiratory failure with hypoxia and hypercapnia (HCC) Active Problems:   AAA (abdominal aortic aneurysm) (HCC)   PAD (peripheral artery disease) (HCC)   Pressure injury of skin   Aortic stenosis    Hospital Course: 81 year old gentleman with piror h/o GERD, PAD, AAA, hypertension. Hypothyroidism, hyperlipidemia, stroke. Diastolic CHF, presented  to ED after a fall at home with syncope,and sob. He was admitted for acute respiratory failure with hypoxia and right proximal humerus fracture..  Cardiology on board . Patient underwent cath showing diffuse non obstructive CAD. Recommended medical management.  Patient was found to have severe aortic stenosis, will need evaluation for TAVR by the structural team.   Assessment and Plan:  Acute respiratory failure with hypoxia and hypercapnia probably secondary to a combination of CAP and COPD exacerbation.  Required BIPAP initially and transitioned to [place] oxygen. Ptient currently on 3 to 4 lit /min of oxygen.  CTA neg for PE.  Started the patient on IV rocephin and zithromax, complete the course of rocephin - 7 days.  Urine strep and legionella antigen negative.  Check ambulating oxygen levels with PT prior to discharge. With his h/o copd and continuous smoking, he might need oxygen on discharge.  No wheezing  heard on exam today. Persistent luekocytosis.     Severe aortic stenosis with diastolic CHF. Echo showed preserved LVEF with a grade 2 diastolic dysfunction.  S/p cath on 11/30/21, results reviewed with the patient. Medical management for now.  Recommend evaluation by the structural team on Monday/ OUTPATIENT to see if he is a candidate for TAVR.  Continue with aspirin and statin.    Displaced right humeral neck fracture status post fall  Ortho consulted, no immediate plans for surgery.  As per orthopedics,   he may certainly come out of his sling to allow his arm to dangle.  He may use his elbow wrist and hand for light feeding and day-to-day activities.  Would have him avoid attempts at shoulder motion.  Recommend repeating x-rays in 4 to 6 weeks and depending on the radiographs consider discontinuing his sling use at that time and progressive motion and ad lib. activities.  Please follow up with Dr Onnie Graham in 4 weeks.      Hypertension:  Bp parameters are well controlled.      Hyperlipidemia:  Continue with crestor.      Hypothyroidism:  Continue with synthroid 75 mcg daily.  Tsh wnl.      Urinary Retention:  S/p foley catheter, which was removed.  Pt was started on flomax  and Urecholine daily.     AAA, / PAD  Continue with aspirin and statin.         Hyponatremia:  Sodium of 132 and stable.  Unclear etiology. He is asymptomatic .      Macrocytic anemia:  Anemia panel  unremarkable.  Hemoglobin between 9.8 to 10.5.  Continue to monitor.      Hypomagnesemia:  Replaced,                  Consultants: orthopedics.  Cardiology  Palliative care.  PCCM Procedures performed: cath.  Disposition: Skilled nursing facility Diet recommendation:  Discharge Diet Orders (From admission, onward)     Start     Ordered   12/03/21 0000  Diet - low sodium heart healthy        12/03/21 1155           Cardiac diet DISCHARGE MEDICATION: Allergies as of  12/03/2021       Reactions   Hydrocodone-acetaminophen Nausea Only, Other (See Comments)   Vertigo, also   Atorvastatin Nausea Only   Other Nausea Only, Other (See Comments)   Vertigo and nausea: "ALL PAINS/NARCOTIC MEDS-CANNOT TOLERATE WELL,"  per Med History prior to 05/23/17   Penicillins Rash   Has patient had a PCN reaction causing immediate rash, facial/tongue/throat swelling, SOB or lightheadedness with hypotension: Unknown Has patient had a PCN reaction causing severe rash involving mucus membranes or skin necrosis: No Has patient had a PCN reaction that required hospitalization: No Has patient had a PCN reaction occurring within the last 10 years: No If all of the above answers are "NO", then may proceed with Cephalosporin use.   Pioglitazone Nausea Only        Medication List     STOP taking these medications    amLODipine 10 MG tablet Commonly known as: NORVASC   HYDROcodone-acetaminophen 5-325 MG tablet Commonly known as: NORCO/VICODIN   losartan 100 MG tablet Commonly known as: COZAAR       TAKE these medications    acetaminophen 500 MG tablet Commonly known as: TYLENOL Take 1,000 mg by mouth 2 (two) times daily.   aspirin EC 81 MG tablet Take 1 tablet (81 mg total) by mouth daily. Swallow whole. Start taking on: December 04, 2021   bethanechol 5 MG tablet Commonly known as: URECHOLINE Take 1 tablet (5 mg total) by mouth 3 (three) times daily.   docusate sodium 100 MG capsule Commonly known as: COLACE Take 1 capsule (100 mg total) by mouth 2 (two) times daily as needed for mild constipation.   FeroSul 325 (65 FE) MG tablet Generic drug: ferrous sulfate TAKE 1 TABLET(325 MG) BY MOUTH DAILY WITH BREAKFAST What changed: See the new instructions.   folic acid 1 MG tablet Commonly known as: FOLVITE Take 1 mg by mouth every morning.   ipratropium-albuterol 0.5-2.5 (3) MG/3ML Soln Commonly known as: DUONEB Take 3 mLs by nebulization every 6 (six) hours  as needed.   levothyroxine 75 MCG tablet Commonly known as: SYNTHROID TAKE 1 TABLET(75 MCG) BY MOUTH DAILY BEFORE AND BREAKFAST What changed: See the new instructions.   methotrexate 2.5 MG tablet Commonly known as: RHEUMATREX Take 20 mg by mouth every Monday. Caution:Chemotherapy. Protect from light.   mineral oil liquid Take 5 mLs by mouth every evening.   oxyCODONE 5 MG immediate release tablet Commonly known as: Oxy IR/ROXICODONE Take 1 tablet (5 mg total) by mouth every 4 (four) hours as needed for up to 3 days for severe pain.   pantoprazole 40 MG tablet Commonly known as: PROTONIX TAKE 1 TABLET(40 MG) BY MOUTH DAILY. NEED OFFICE VISIT FOR REFILLS. What changed: See the new instructions.   polyethylene glycol 17 g packet Commonly known as: MIRALAX / GLYCOLAX Take 17 g by mouth  daily as needed for moderate constipation.   rosuvastatin 20 MG tablet Commonly known as: CRESTOR Take 1 tablet (20 mg total) by mouth daily. Start taking on: December 04, 2021   tamsulosin 0.4 MG Caps capsule Commonly known as: FLOMAX TAKE 1 CAPSULE(0.4 MG) BY MOUTH DAILY What changed: See the new instructions.   VITAMIN D3 GUMMIES PO Take 1 tablet by mouth every morning.               Discharge Care Instructions  (From admission, onward)           Start     Ordered   12/03/21 0000  Discharge wound care:       Comments: LOCAL WOUND CARE   12/03/21 1155            Follow-up Information     Justice Britain, MD Follow up.   Specialty: Orthopedic Surgery Why: Please arrange follow-up in 4 to 6 weeks for right shoulder x-rays. Contact information: [address] [place] [address] [phone]                Discharge Exam: Filed Weights   11/30/21 0556 12/01/21 0500 12/03/21 0300  Weight: 47.1 kg 47 kg 50.3 kg   General exam: Appears calm and comfortable  Respiratory system: Clear to auscultation. Respiratory effort normal. Cardiovascular  system: S1 & S2 heard, RRR. No JVD,murmer present.  No pedal edema. Gastrointestinal system: Abdomen is nondistended, soft and nontender. . Normal bowel sounds heard. Central nervous system: Alert and oriented. No focal neurological deficits. Extremities: Symmetric 5 x 5 power. Skin: No rashes, lesions or ulcers Psychiatry: Judgement and insight appear normal. Mood & affect appropriate.    Condition at discharge: fair  The results of significant diagnostics from this hospitalization (including imaging, microbiology, ancillary and laboratory) are listed below for reference.   Imaging Studies: CARDIAC CATHETERIZATION  Addendum Date: 11/30/2021     2nd Diag lesion is 50% stenosed.   RPDA lesion is 80% stenosed.   Dist LM lesion is 25% stenosed.   The left ventricular systolic function is normal.   LV end diastolic pressure is normal.   The left ventricular ejection fraction is 55-65% by visual estimate.   There is severe aortic valve stenosis.  Calculated aortic valve area 0.93 cm2.  Mean gradient 30 mmHg   Aortic saturation 98%, PA saturation 69%, PA pressure 30/7, mean PA pressure 15 mmHg, mean pulmonary capillary wedge pressure 2 mmHg, cardiac output 4.91 L/min, cardiac index 3.21. Severely calcified right common femoral and iliac system.  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 Diffuse, calcific coronary artery disease which does not appear obstructive.  There is a small PDA with a severe lesion but this would not be a target for revascularization. Severe aortic stenosis by calculated valve area.  Further plans per the TAVR team.  Result Date: 11/30/2021   2nd Diag lesion is 50% stenosed.   RPDA lesion is 80% stenosed.   Dist LM lesion is 25% stenosed.   The left ventricular systolic function is normal.   LV end diastolic pressure is normal.   The left ventricular ejection fraction is 55-65% by visual estimate.   There is severe aortic valve stenosis.  Calculated aortic valve  area 0.93 cm2.  Mean gradient 30 mmHg   Aortic saturation 98%, PA saturation 69%, PA pressure 30/7, mean PA pressure 15 mmHg, mean pulmonary capillary wedge pressure 2 mmHg, cardiac output 4.91 L/min, cardiac index  3.21. Severely calcified right common femoral and iliac system.  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 Diffuse, calcific coronary artery disease which does not appear obstructive.  There is a small PDA with a severe lesion but this would not be a target for revascularization. Severe aortic stenosis by calculated valve area.  Further plans per the TAVR team.   DG Chest Port 1 View  Result Date: 11/28/2021 CLINICAL DATA:  Respiratory failure. EXAM: PORTABLE CHEST 1 VIEW COMPARISON:  CTA chest 11/26/2021 FINDINGS: The patient is rotated to the right. The heart is slightly enlarged. The central vessels are normal caliber. The upper lobes are mildly emphysematous. Moderate right and small left pleural effusions and overlying atelectasis or consolidation continue to be noted in the lung bases with the mid and upper lung fields remaining clear. Overall aeration appears unchanged. The aorta is tortuous and calcified. Stable mediastinum. A moderately displaced comminuted proximal right humeral fracture is again noted. There is osteoporosis.  Mild levoscoliosis thoracic spine. IMPRESSION: 1. No essential change in right-greater-than-left pleural effusions and overlying basilar lung opacities. 2. Aortic atherosclerosis. 3. Displaced proximal right humeral fracture, with osteoporosis. Electronically Signed   By: Telford Nab M.D.   On: 11/28/2021 06:36   ECHOCARDIOGRAM COMPLETE  Result Date: 11/26/2021    ECHOCARDIOGRAM REPORT   Patient Name:   TORIS LAVERDIERE Date of Exam: 11/26/2021 Medical Rec #:  [id number]            Height:       67.0 in Accession #:    [id number]           Weight:       104.5 lb Date of Birth:  1941-04-17            BSA:          1.535 m Patient Age:    39  years             BP:           172/103 mmHg Patient Gender: M                    HR:           106 bpm. Exam Location:  Inpatient Procedure: 2D Echo, 3D Echo, Cardiac Doppler and Color Doppler Indications:    R55 Syncope  History:        Patient has prior history of Echocardiogram examinations, most                 recent 08/10/2019. Aortic Valve Disease, Signs/Symptoms:Shortness                 of Breath and Dyspnea; Risk Factors:Hypertension and Current                 Smoker. ETOH. Aortic stenosis.  Sonographer:    Roseanna Rainbow RDCS Referring Phys: [id number] [facility]  Sonographer Comments: Suboptimal parasternal window and Technically difficult study due to poor echo windows. Very low parasternal. Could not move patient due to broken arm. IMPRESSIONS  1. Left ventricular ejection fraction, by estimation, is 50 to 55%. The left ventricle has low normal function. The left ventricle has no regional wall motion abnormalities. There is mild left ventricular hypertrophy. Left ventricular diastolic parameters are consistent with Grade II diastolic dysfunction (pseudonormalization). Elevated left atrial pressure.  2. Right ventricular systolic function is normal. The right ventricular size is normal. There is moderately elevated pulmonary artery  systolic pressure. The estimated right ventricular systolic pressure is 09.7 mmHg.  3. Left atrial size was moderately dilated.  4. The mitral valve is degenerative. Mild mitral valve regurgitation. Mild mitral stenosis. The mean mitral valve gradient is 5.0 mmHg with average heart rate of 102 bpm. Severe mitral annular calcification.  5. The inferior vena cava is dilated in size with <50% respiratory variability, suggesting right atrial pressure of 15 mmHg.  6. The aortic valve was not well visualized. There is severe calcifcation of the aortic valve. Aortic valve regurgitation is not visualized. Severe aortic valve stenosis. AS moderate by gradients (Vmax 3.6 m/s, MG 31 mmHg),  severe by AVA (0.7 cm^2) and DI (0.21). Low SV index (35 cc/m^2), suspect paradoxical low flow low gradient severe AS FINDINGS  Left Ventricle: Left ventricular ejection fraction, by estimation, is 50 to 55%. The left ventricle has low normal function. The left ventricle has no regional wall motion abnormalities. The left ventricular internal cavity size was normal in size. There is mild left ventricular hypertrophy. Left ventricular diastolic parameters are consistent with Grade II diastolic dysfunction (pseudonormalization). Elevated left atrial pressure. Right Ventricle: The right ventricular size is normal. No increase in right ventricular wall thickness. Right ventricular systolic function is normal. There is moderately elevated pulmonary artery systolic pressure. The tricuspid regurgitant velocity is 2.78 m/s, and with an assumed right atrial pressure of 15 mmHg, the estimated right ventricular systolic pressure is 35.3 mmHg. Left Atrium: Left atrial size was moderately dilated. Right Atrium: Right atrial size was normal in size. Pericardium: There is no evidence of pericardial effusion. Mitral Valve: The mitral valve is degenerative in appearance. Severe mitral annular calcification. Mild mitral valve regurgitation. Mild mitral valve stenosis. MV peak gradient, 13.5 mmHg. The mean mitral valve gradient is 5.0 mmHg with average heart rate of 102 bpm. Tricuspid Valve: The tricuspid valve is normal in structure. Tricuspid valve regurgitation is trivial. Aortic Valve: The aortic valve was not well visualized. There is severe calcifcation of the aortic valve. Aortic valve regurgitation is not visualized. Moderate to severe aortic stenosis is present. Aortic valve mean gradient measures 29.0 mmHg. Aortic valve peak gradient measures 50.2 mmHg. Aortic valve area, by VTI measures 0.80 cm. Pulmonic Valve: The pulmonic valve was not well visualized. Pulmonic valve regurgitation is not visualized. Aorta: The aortic  root and ascending aorta are structurally normal, with no evidence of dilitation. Venous: The inferior vena cava is dilated in size with less than 50% respiratory variability, suggesting right atrial pressure of 15 mmHg. IAS/Shunts: The interatrial septum was not well visualized.  LEFT VENTRICLE PLAX 2D LVIDd:         4.50 cm     Diastology LVIDs:         3.30 cm     LV e' medial:    6.53 cm/s LV PW:         0.90 cm     LV E/e' medial:  13.8 LV IVS:        1.20 cm     LV e' lateral:   4.35 cm/s LVOT diam:     2.10 cm     LV E/e' lateral: 20.7 LV SV:         53 LV SV Index:   35 LVOT Area:     3.46 cm                             3D Volume  EF: LV Volumes (MOD)           3D EF:        56 % LV vol d, MOD A2C: 79.8 ml LV EDV:       102 ml LV vol d, MOD A4C: 50.5 ml LV ESV:       45 ml LV vol s, MOD A2C: 36.4 ml LV SV:        57 ml LV vol s, MOD A4C: 21.6 ml LV SV MOD A2C:     43.4 ml LV SV MOD A4C:     50.5 ml LV SV MOD BP:      34.4 ml RIGHT VENTRICLE             IVC RV S prime:     10.90 cm/s  IVC diam: 2.20 cm TAPSE (M-mode): 1.3 cm LEFT ATRIUM             Index        RIGHT ATRIUM           Index LA diam:        3.80 cm 2.48 cm/m   RA Area:     12.20 cm LA Vol (A2C):   78.9 ml 51.41 ml/m  RA Volume:   26.00 ml  16.94 ml/m LA Vol (A4C):   57.1 ml 37.21 ml/m LA Biplane Vol: 72.1 ml 46.98 ml/m  AORTIC VALVE AV Area (Vmax):    1.00 cm AV Area (Vmean):   0.93 cm AV Area (VTI):     0.80 cm AV Vmax:           354.40 cm/s AV Vmean:          252.000 cm/s AV VTI:            0.670 m AV Peak Grad:      50.2 mmHg AV Mean Grad:      29.0 mmHg LVOT Vmax:         102.00 cm/s LVOT Vmean:        67.800 cm/s LVOT VTI:          0.154 m LVOT/AV VTI ratio: 0.23  AORTA Ao Root diam: 3.50 cm MITRAL VALVE                TRICUSPID VALVE MV Area (PHT): 7.16 cm     TR Peak grad:   30.9 mmHg MV Area VTI:   2.01 cm     TR Vmax:        278.00 cm/s MV Peak grad:  13.5 mmHg MV Mean grad:  5.0 mmHg     SHUNTS MV Vmax:       1.84 m/s      Systemic VTI:  0.15 m MV Vmean:      110.0 cm/s   Systemic Diam: 2.10 cm MV Decel Time: 106 msec MV E velocity: 90.00 cm/s MV A velocity: 176.00 cm/s MV E/A ratio:  0.51 Oswaldo Milian MD Electronically signed by Oswaldo Milian MD Signature Date/Time: 11/26/2021/1:58:10 PM    Final    CT Angio Chest Pulmonary Embolism (PE) W or WO Contrast  Result Date: 11/26/2021 CLINICAL DATA:  Syncope, no other cardiac signs/symptoms r/o PE EXAM: CT ANGIOGRAPHY CHEST WITH CONTRAST TECHNIQUE: Multidetector CT imaging of the chest was performed using the standard protocol during bolus administration of intravenous contrast. Multiplanar CT image reconstructions and MIPs were obtained to evaluate the vascular anatomy. RADIATION DOSE REDUCTION: This exam was performed according to the departmental dose-optimization  program which includes automated exposure control, adjustment of the mA and/or kV according to patient size and/or use of iterative reconstruction technique. CONTRAST:  72m OMNIPAQUE IOHEXOL 350 MG/ML SOLN COMPARISON:  CT angiogram from yesterday FINDINGS: Cardiovascular: Satisfactory opacification of the pulmonary arteries to the segmental level. No evidence of pulmonary embolism. Right pulmonary artery is prominent measuring 3.5 cm in greatest dimension. Moderate atheromatous calcifications of the thoracic aorta. Severe coronary artery calcifications. Normal heart size. No pericardial effusion. Mediastinum/Nodes: No enlarged mediastinal, hilar, or axillary lymph nodes. Thyroid gland, trachea, and esophagus demonstrate no significant findings. Lungs/Pleura: .Marland KitchenThere is small to moderate-sized consolidation with air air bronchograms seen at the right lung base and is more prominent in the present study. Scattered airspace opacities predominantly in the right lung stable. Small to moderate right and small left pleural effusion with left basilar atelectasis. Upper Abdomen: Moderately severe atheromatous  calcifications of the abdominal aorta and the visceral vasculature including the splenic artery. Musculoskeletal: Again seen is the high-grade wedge deformity of the L1 vertebral body. Impacted angulated fracture of the partially visualized proximal right humerus. Disc degenerative disease and ankylosis of the thoracic spine. Review of the MIP images confirms the above findings. IMPRESSION: 1. No pulmonary embolism. Right pulmonary artery is prominent measuring 3.5 cm in greatest dimension. 2. Small to moderate consolidation with air bronchograms at the right lung base and is more prominent in the present study. Scattered airspace opacities predominantly in the right lung stable. 3.  Small to moderate right and small left pleural effusion. 4. Unchanged high-grade wedge deformity of the L1 vertebral body. Disc degenerative disease and ankylosis of the thoracic spine. Impacted angulated fracture of the partially seen proximal right humerus. Electronically Signed   By: AFrazier RichardsM.D.   On: 11/26/2021 11:56   CT Angio Chest/Abd/Pel for Dissection W and/or Wo Contrast  Result Date: 11/25/2021 CLINICAL DATA:  Chest and back pain, right shoulder pain, aortic dissection suspected, fall today EXAM: CT ANGIOGRAPHY CHEST, ABDOMEN AND PELVIS TECHNIQUE: Non-contrast CT of the chest was initially obtained. Multidetector CT imaging through the chest, abdomen and pelvis was performed using the standard protocol during bolus administration of intravenous contrast. Multiplanar reconstructed images and MIPs were obtained and reviewed to evaluate the vascular anatomy. RADIATION DOSE REDUCTION: This exam was performed according to the departmental dose-optimization program which includes automated exposure control, adjustment of the mA and/or kV according to patient size and/or use of iterative reconstruction technique. CONTRAST:  1039mOMNIPAQUE IOHEXOL 350 MG/ML SOLN COMPARISON:  CTA abdomen pelvis, 04/26/2021 FINDINGS: CTA  CHEST FINDINGS VASCULAR Aorta: Satisfactory opacification of the aorta. Normal contour and caliber of the thoracic aorta. No evidence of aneurysm, dissection, or other acute aortic pathology. Aortic valve calcifications. Moderate mixed calcific atherosclerosis Cardiovascular: No evidence of pulmonary embolism on limited non-tailored examination. Mild cardiomegaly. Three-vessel coronary artery calcifications. No pericardial effusion. Review of the MIP images confirms the above findings. NON VASCULAR Mediastinum/Nodes: No enlarged mediastinal, hilar, or axillary lymph nodes. Thyroid gland, trachea, and esophagus demonstrate no significant findings. Lungs/Pleura: Small bilateral pleural effusions and associated atelectasis or consolidation, right greater than left. Diffuse bilateral bronchial wall thickening. Mild centrilobular and paraseptal emphysema. Scattered ground-glass airspace opacities throughout the lungs, primarily involving the right lung (series 8, image 87, 34). Musculoskeletal: No chest wall abnormality. No acute osseous findings. Review of the MIP images confirms the above findings. CTA ABDOMEN AND PELVIS FINDINGS VASCULAR Fusiform aneurysm of the abdominal aorta, measuring up to 4.1 x 3.9 cm in  the infrarenal portion of the vessel. No evidence of dissection or other acute aortic pathology. Standard branching pattern of the abdominal aorta with solitary bilateral renal arteries. Atherosclerosis at the branch vessel origins without high-grade stenosis. Severe mixed calcific atherosclerosis. 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 Aneurysm or pseudoaneurysm of the left common femoral artery measuring up to 3.2 x 2.9 cm (series 6, image 180). Review of the MIP images confirms the above findings. NON-VASCULAR Hepatobiliary: No solid liver abnormality is seen. No gallstones, gallbladder wall thickening, or biliary dilatation. Pancreas: Unremarkable. No  pancreatic ductal dilatation or surrounding inflammatory changes. Spleen: Normal in size without significant abnormality. Adrenals/Urinary Tract: Adrenal glands are unremarkable. Kidneys are normal, without renal calculi, solid lesion, or hydronephrosis. Bladder is unremarkable. Stomach/Bowel: Stomach is within normal limits. Appendix is not clearly visualized. No evidence of bowel wall thickening, distention, or inflammatory changes. Sigmoid diverticula. Lymphatic: No enlarged abdominal or pelvic lymph nodes. Reproductive: No mass or other significant abnormality. Other: No abdominal wall hernia or abnormality. No ascites. Musculoskeletal: Impacted, angulated fracture of the proximal right humerus (series 9, image 34). Disc degenerative disease and ankylosis of the thoracic spine. Unchanged high-grade wedge deformity of the L1 vertebral body. Interval vertebral cement augmentation of the L3 and L4 vertebral bodies. New, subtle superior endplate deformity of the L5 vertebral body (series 12, image 81). Intramedullary nail fixation of the right femur. IMPRESSION: 1. No evidence of thoracic aortic aneurysm, dissection, or other acute aortic pathology. 2. Fusiform aneurysm of the abdominal aorta, measuring up to 4.1 x 3.9 cm in the infrarenal portion of the vessel. No evidence of dissection or other acute aortic pathology. 3. 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 4. 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 5. Aneurysmal findings above are unchanged compared to prior examination. 6. Impacted, angulated fracture of the proximal right humerus. 7. Multiple wedge deformities of the lumbar vertebral bodies, status post interval vertebral cement augmentation of the L3 and L4 vertebral bodies. Unchanged high-grade L1 wedge deformity with a new, subtle superior endplate deformity of the L5 vertebral body, age indeterminate. Osteopenia. 8. Small  bilateral pleural effusions and associated atelectasis or consolidation, right greater than left. 9. Scattered ground-glass airspace opacities throughout the lungs, primarily involving the right lung. Findings are consistent with nonspecific infection or inflammation. 10. Coronary artery disease. Aortic Atherosclerosis (ICD10-I70.0) and Emphysema (ICD10-J43.9). Electronically Signed   By: Delanna Ahmadi M.D.   On: 11/25/2021 18:28   DG Shoulder Right  Result Date: 11/25/2021 CLINICAL DATA:  Fall. EXAM: RIGHT SHOULDER - 2+ VIEW COMPARISON:  None Available. FINDINGS: There is an acute oblique fracture through the right humeral neck. There is 1 shaft with anterior displacement of the distal fracture fragment with apex anterior angulation. There is no dislocation. There are mild degenerative changes of the acromioclavicular joint. There is soft tissue swelling surrounding the fracture. IMPRESSION: Displaced right humeral neck fracture. Electronically Signed   By: Ronney Asters M.D.   On: 11/25/2021 18:20   CT HEAD WO CONTRAST (5MM)  Result Date: 11/25/2021 CLINICAL DATA:  Fall head trauma EXAM: CT HEAD WITHOUT CONTRAST CT CERVICAL SPINE WITHOUT CONTRAST TECHNIQUE: Multidetector CT imaging of the head and cervical spine was performed following the standard protocol without intravenous contrast. Multiplanar CT image reconstructions of the cervical spine were also generated. RADIATION DOSE REDUCTION: This  exam was performed according to the departmental dose-optimization program which includes automated exposure control, adjustment of the mA and/or kV according to patient size and/or use of iterative reconstruction technique. COMPARISON:  None Available. FINDINGS: CT HEAD FINDINGS Brain: No evidence of acute infarction, hemorrhage, hydrocephalus, extra-axial collection or mass lesion/mass effect. Vascular: No hyperdense vessel or unexpected calcification. Skull: Normal. Negative for fracture or focal lesion.  Sinuses/Orbits: No acute finding. Other: None. CT CERVICAL SPINE FINDINGS Alignment: Normal. Skull base and vertebrae: No acute fracture. No primary bone lesion or focal pathologic process. Soft tissues and spinal canal: No prevertebral fluid or swelling. No visible canal hematoma. Disc levels: Mild disc space height loss and osteophytosis throughout. Upper chest: Negative. Other: None. IMPRESSION: 1. No acute intracranial pathology. 2. No fracture or static subluxation of the cervical spine. 3. Mild multilevel cervical disc degenerative disease. Electronically Signed   By: Delanna Ahmadi M.D.   On: 11/25/2021 18:16   CT Cervical Spine Wo Contrast  Result Date: 11/25/2021 CLINICAL DATA:  Fall head trauma EXAM: CT HEAD WITHOUT CONTRAST CT CERVICAL SPINE WITHOUT CONTRAST TECHNIQUE: Multidetector CT imaging of the head and cervical spine was performed following the standard protocol without intravenous contrast. Multiplanar CT image reconstructions of the cervical spine were also generated. RADIATION DOSE REDUCTION: This exam was performed according to the departmental dose-optimization program which includes automated exposure control, adjustment of the mA and/or kV according to patient size and/or use of iterative reconstruction technique. COMPARISON:  None Available. FINDINGS: CT HEAD FINDINGS Brain: No evidence of acute infarction, hemorrhage, hydrocephalus, extra-axial collection or mass lesion/mass effect. Vascular: No hyperdense vessel or unexpected calcification. Skull: Normal. Negative for fracture or focal lesion. Sinuses/Orbits: No acute finding. Other: None. CT CERVICAL SPINE FINDINGS Alignment: Normal. Skull base and vertebrae: No acute fracture. No primary bone lesion or focal pathologic process. Soft tissues and spinal canal: No prevertebral fluid or swelling. No visible canal hematoma. Disc levels: Mild disc space height loss and osteophytosis throughout. Upper chest: Negative. Other: None.  IMPRESSION: 1. No acute intracranial pathology. 2. No fracture or static subluxation of the cervical spine. 3. Mild multilevel cervical disc degenerative disease. Electronically Signed   By: Delanna Ahmadi M.D.   On: 11/25/2021 18:16   VAS [place] ABI WITH/WO TBI  Result Date: 11/15/2021  LOWER EXTREMITY DOPPLER STUDY Patient Name:  ZAKARIA SEDOR  Date of Exam:   11/15/2021 Medical Rec #: [id number]             Accession #:    [id number] Date of Birth: [birth year]             Patient Gender: M Patient Age:   6 years Exam Location:  [facility] Procedure:      VAS [place] ABI WITH/WO TBI Referring Phys: Harold Barban --------------------------------------------------------------------------------  Indications: Claudication, and peripheral artery disease. High Risk Factors: Hypertension, hyperlipidemia, current smoker, prior CVA.  Vascular Interventions: 10/13/2012: Left femoral artery and popliteal artery                         atherectomy and stenting.                          01/23/2012: Left iliofemoral endarterectomy and                         angioplasty. Comparison Study: 07/12/2019: Rt ABI 0.59; Lt ABI 1.06 Performing  Technologist: Ivan Croft  Examination Guidelines: A complete evaluation includes at minimum, Doppler waveform signals and systolic blood pressure reading at the level of bilateral brachial, anterior tibial, and posterior tibial arteries, when vessel segments are accessible. Bilateral testing is considered an integral part of a complete examination. Photoelectric Plethysmograph (PPG) waveforms and toe systolic pressure readings are included as required and additional duplex testing as needed. Limited examinations for reoccurring indications may be performed as noted.  ABI Findings: +---------+------------------+-----+----------+--------+ Right    Rt Pressure (mmHg)IndexWaveform  Comment  +---------+------------------+-----+----------+--------+ Brachial 127                                        +---------+------------------+-----+----------+--------+ PTA      85                0.67 monophasic         +---------+------------------+-----+----------+--------+ DP       104               0.82 monophasic         +---------+------------------+-----+----------+--------+ Great Toe50                0.39                    +---------+------------------+-----+----------+--------+ +---------+------------------+-----+--------+-------+ Left     Lt Pressure (mmHg)IndexWaveformComment +---------+------------------+-----+--------+-------+ Brachial 118                                    +---------+------------------+-----+--------+-------+ PTA      119               0.94 biphasic        +---------+------------------+-----+--------+-------+ DP       130               1.02 biphasic        +---------+------------------+-----+--------+-------+ Great Toe42                0.33                 +---------+------------------+-----+--------+-------+ +-------+-----------+-----------+------------+------------+ ABI/TBIToday's ABIToday's TBIPrevious ABIPrevious TBI +-------+-----------+-----------+------------+------------+ Right  0.82       0.39       0.59        0.42         +-------+-----------+-----------+------------+------------+ Left   1.02       0.33       1.06        0.45         +-------+-----------+-----------+------------+------------+   Summary: Right: Resting right ankle-brachial index indicates mild right lower extremity arterial disease. The right toe-brachial index is abnormal. Left: Resting left ankle-brachial index is within normal range. No evidence of significant left lower extremity arterial disease. The left toe-brachial index is abnormal. *See table(s) above for measurements and observations.  Electronically signed by Deitra Mayo MD on 11/15/2021 at 1:27:08 PM.    Final     Microbiology: Results for orders placed  or performed during the hospital encounter of 11/25/21  Blood culture (routine x 2)     Status: Abnormal   Collection Time: 11/25/21  6:09 PM   Specimen: BLOOD  Result Value Ref Range Status   Specimen Description   Final    BLOOD RIGHT ANTECUBITAL Performed at [facility], [address] [place],  [place] [address]    Special Requests   Final    BOTTLES DRAWN AEROBIC AND ANAEROBIC Blood Culture adequate volume Performed at [facility] [address] [place], [place] [address]    Culture  Setup Time   Final    GRAM POSITIVE COCCI IN CLUSTERS AEROBIC BOTTLE ONLY CRITICAL RESULT CALLED TO, READ BACK BY AND VERIFIED WITH: PHARMD JIGNA GADHIA ON 11/26/21 @ 1717 BY DRT    Culture (A)  Final    STAPHYLOCOCCUS EPIDERMIDIS THE SIGNIFICANCE OF ISOLATING THIS ORGANISM FROM A SINGLE SET OF BLOOD CULTURES WHEN MULTIPLE SETS ARE DRAWN IS UNCERTAIN. PLEASE NOTIFY THE MICROBIOLOGY DEPARTMENT WITHIN ONE WEEK IF SPECIATION AND SENSITIVITIES ARE REQUIRED. Performed at [facility], [place] [address], [place], [place] [address]    Report Status 11/27/2021 FINAL  Final  Blood Culture ID Panel (Reflexed)     Status: Abnormal   Collection Time: 11/25/21  6:09 PM  Result Value Ref Range Status   Enterococcus faecalis NOT DETECTED NOT DETECTED Final   Enterococcus Faecium NOT DETECTED NOT DETECTED Final   Listeria monocytogenes NOT DETECTED NOT DETECTED Final   Staphylococcus species DETECTED (A) NOT DETECTED Final    Comment: CRITICAL RESULT CALLED TO, READ BACK BY AND VERIFIED WITH: PHARMD JIGNA GADHIA ON 11/26/21 @ 1717 BY DRT    Staphylococcus aureus (BCID) NOT DETECTED NOT DETECTED Final   Staphylococcus epidermidis DETECTED (A) NOT DETECTED Final    Comment: CRITICAL RESULT CALLED TO, READ BACK BY AND VERIFIED WITH: PHARMD JIGNA GADHIA ON 11/26/21 @ 1717 BY DRT    Staphylococcus lugdunensis NOT DETECTED NOT DETECTED Final   Streptococcus species NOT  DETECTED NOT DETECTED Final   Streptococcus agalactiae NOT DETECTED NOT DETECTED Final   Streptococcus pneumoniae NOT DETECTED NOT DETECTED Final   Streptococcus pyogenes NOT DETECTED NOT DETECTED Final   A.calcoaceticus-baumannii NOT DETECTED NOT DETECTED Final   Bacteroides fragilis NOT DETECTED NOT DETECTED Final   Enterobacterales NOT DETECTED NOT DETECTED Final   Enterobacter cloacae complex NOT DETECTED NOT DETECTED Final   Escherichia coli NOT DETECTED NOT DETECTED Final   Klebsiella aerogenes NOT DETECTED NOT DETECTED Final   Klebsiella oxytoca NOT DETECTED NOT DETECTED Final   Klebsiella pneumoniae NOT DETECTED NOT DETECTED Final   Proteus species NOT DETECTED NOT DETECTED Final   Salmonella species NOT DETECTED NOT DETECTED Final   Serratia marcescens NOT DETECTED NOT DETECTED Final   Haemophilus influenzae NOT DETECTED NOT DETECTED Final   Neisseria meningitidis NOT DETECTED NOT DETECTED Final   Pseudomonas aeruginosa NOT DETECTED NOT DETECTED Final   Stenotrophomonas maltophilia NOT DETECTED NOT DETECTED Final   Candida albicans NOT DETECTED NOT DETECTED Final   Candida auris NOT DETECTED NOT DETECTED Final   Candida glabrata NOT DETECTED NOT DETECTED Final   Candida krusei NOT DETECTED NOT DETECTED Final   Candida parapsilosis NOT DETECTED NOT DETECTED Final   Candida tropicalis NOT DETECTED NOT DETECTED Final   Cryptococcus neoformans/gattii NOT DETECTED NOT DETECTED Final   Methicillin resistance mecA/C NOT DETECTED NOT DETECTED Final    Comment: Performed at [facility], [place]. [address] [place], [place] [address]  Blood culture (routine x 2)     Status: None   Collection Time: 11/25/21  6:14 PM   Specimen: BLOOD  Result Value Ref Range Status   Specimen Description   Final    BLOOD LEFT ANTECUBITAL Performed at [facility] [address], [place], [place] [address]  Special Requests   Final    BOTTLES DRAWN AEROBIC AND ANAEROBIC Blood  Culture results may not be optimal due to an inadequate volume of blood received in culture bottles Performed at [facility] [address], [place], [place] [address]    Culture   Final    NO GROWTH 5 DAYS Performed at [facility], [place] [address], [place], [place] [address]    Report Status 12/01/2021 FINAL  Final  SARS Coronavirus 2 by RT PCR (hospital order, performed in [facility] hospital lab) *cepheid single result test* Anterior Nasal Swab     Status: None   Collection Time: 11/25/21  7:53 PM   Specimen: Anterior Nasal Swab  Result Value Ref Range Status   SARS Coronavirus 2 by RT PCR NEGATIVE NEGATIVE Final    Comment: (NOTE) SARS-CoV-2 target nucleic acids are NOT DETECTED.  The SARS-CoV-2 RNA is generally detectable in upper and lower respiratory specimens during the acute phase of infection. The lowest concentration of SARS-CoV-2 viral copies this assay can detect is 250 copies / mL. A negative result does not preclude SARS-CoV-2 infection and should not be used as the sole basis for treatment or other patient management decisions.  A negative result may occur with improper specimen collection / handling, submission of specimen other than nasopharyngeal swab, presence of viral mutation(s) within the areas targeted by this assay, and inadequate number of viral copies (<250 copies / mL). A negative result must be combined with clinical observations, patient history, and epidemiological information.  Fact Sheet for Patients:   https://www.patel.info/  Fact Sheet for Healthcare Providers: https://hall.com/  This test is not yet approved or  cleared by the [place] FDA and has been authorized for detection and/or diagnosis of SARS-CoV-2 by FDA under an Emergency Use Authorization (EUA).  This EUA will remain in effect (meaning this test can be used) for the duration of the COVID-19 declaration  under Section 564(b)(1) of the Act, 21 U.S.C. section 360bbb-3(b)(1), unless the authorization is terminated or revoked sooner.  Performed at [facility], [place] [address], [place], [place] [address]   Respiratory (~20 pathogens) panel by PCR     Status: None   Collection Time: 11/25/21  7:54 PM   Specimen: Anterior Nasal Swab; Respiratory  Result Value Ref Range Status   Adenovirus NOT DETECTED NOT DETECTED Final   Coronavirus 229E NOT DETECTED NOT DETECTED Final    Comment: (NOTE) The Coronavirus on the Respiratory Panel, DOES NOT test for the novel  Coronavirus (2019 nCoV)    Coronavirus HKU1 NOT DETECTED NOT DETECTED Final   Coronavirus NL63 NOT DETECTED NOT DETECTED Final   Coronavirus OC43 NOT DETECTED NOT DETECTED Final   Metapneumovirus NOT DETECTED NOT DETECTED Final   Rhinovirus / Enterovirus NOT DETECTED NOT DETECTED Final   Influenza A NOT DETECTED NOT DETECTED Final   Influenza B NOT DETECTED NOT DETECTED Final   Parainfluenza Virus 1 NOT DETECTED NOT DETECTED Final   Parainfluenza Virus 2 NOT DETECTED NOT DETECTED Final   Parainfluenza Virus 3 NOT DETECTED NOT DETECTED Final   Parainfluenza Virus 4 NOT DETECTED NOT DETECTED Final   Respiratory Syncytial Virus NOT DETECTED NOT DETECTED Final   Bordetella pertussis NOT DETECTED NOT DETECTED Final   Bordetella Parapertussis NOT DETECTED NOT DETECTED Final   Chlamydophila pneumoniae NOT DETECTED NOT DETECTED Final   Mycoplasma pneumoniae NOT DETECTED NOT DETECTED Final    Comment: Performed at [facility], [address] [place], [place] [address]  MRSA Next Gen by PCR, Nasal     Status: None   Collection Time: 11/25/21 10:14 PM   Specimen: Nasal Mucosa; Nasal Swab  Result Value Ref Range Status   MRSA by PCR Next Gen NOT DETECTED NOT DETECTED Final    Comment: (NOTE) The GeneXpert MRSA Assay (FDA approved for NASAL specimens only), is one component of a comprehensive MRSA colonization  surveillance program. It is not intended to diagnose MRSA infection nor to guide or monitor treatment for MRSA infections. Test performance is not FDA approved in patients less than 92 years old. Performed at [facility], [place] [address] [place], [place] [address]     Labs: CBC: Recent Labs  Lab 11/27/21 0157 11/28/21 0425 11/30/21 0423 11/30/21 0950 11/30/21 1003 11/30/21 1416 12/02/21 0333  WBC 15.6* 12.5* 11.1*  --   --  12.6* 12.0*  NEUTROABS  --   --   --   --   --   --  9.4*  HGB 10.2* 10.3* 10.5* 9.9* 9.9*  10.2* 10.4* 9.8*  HCT 31.7* 32.7* 32.6* 29.0* 29.0*  30.0* 31.2* 28.9*  MCV 106.0* 106.5* 104.2*  --   --  101.3* 99.3  PLT 232 201 185  --   --  194 662   Basic Metabolic Panel: Recent Labs  Lab 11/27/21 0157 11/28/21 0425 11/30/21 0423 11/30/21 0950 11/30/21 1003 11/30/21 1416 12/02/21 0333 12/03/21 1000  NA 140 136 134* 130* 131*  131*  --  131* 132*  K 3.2* 4.6 4.2 4.1 4.1  4.1  --  4.0 3.9  CL 97* 94* 91*  --   --   --  90* 89*  CO2 34* 34* 38*  --   --   --  34* 37*  GLUCOSE 118* 95 109*  --   --   --  113* 107*  BUN 25* 20 9  --   --   --  7* 6*  CREATININE 0.56* <0.30* 0.33*  --   --  0.35* 0.34* 0.40*  CALCIUM 8.6* 8.5* 8.5*  --   --   --  8.2* 8.5*  MG 1.5* 1.8  --   --   --   --  1.5*  --   PHOS 2.8 3.1  --   --   --   --   --   --    Liver Function Tests: Recent Labs  Lab 11/30/21 0423  AST 30  ALT 58*  ALKPHOS 73  BILITOT 0.5  PROT 5.4*  ALBUMIN 2.6*   CBG: No results for input(s): "GLUCAP" in the last 168 hours.  Discharge time spent: 46 minutes.   Signed: Hosie Poisson, MD [facility] 12/03/2021

## 2021-12-03 NOTE — Consult Note (Signed)
Consultation Note Date: 12/03/2021   Patient Name: Ronnie Snyder  DOB: 1940-07-02  MRN: [id number]  Age / Sex: 81 y.o., male  PCP: Luetta Nutting, DO Referring Physician: Hosie Poisson, MD  Reason for Consultation: Establishing goals of care  HPI/Patient Profile: 81 y.o. male  with past medical history of AAA, anxiety, arthritis, GERD, history of hiatal hernia, hepatic steatosis, HTN/HLD, hypothyroid, PAD, history of skin cancer, history of stroke, tubular adenocarcinoma of colon, cigarette smoker since age 10 admitted on 11/25/2021 with acute respiratory failure with hypoxia and hypercapnia probably secondary to combined CAP/COPD.   Clinical Assessment and Goals of Care: I have reviewed medical records including EPIC notes, labs and imaging, received report from RN, assessed the patient.  Ronnie Snyder is sitting up in the [place] chair in his room.  He greets me, making and mostly keeping eye contact.  He appears acutely/chronically ill and is very frail.  He is alert and oriented, able to make his needs known.  There is no family at bedside at this time.  We meet at bedside to discuss diagnosis prognosis, GOC, EOL wishes, disposition and options.  I introduced Palliative Medicine as specialized medical care for people living with serious illness. It focuses on providing relief from the symptoms and stress of a serious illness. The goal is to improve quality of life for both the patient and the family.  We discussed a brief life review of the patient.  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  He and his wife have been married for many years.  They had 2 sons, 1 son died about 4 years ago at age 40.  His son Ronnie Snyder works here at [employer].  He has support from his family his daughter-in-law manages their pills.  He tells me that his wife Ronnie Snyder has COPD, he has been a caregiver for  her for about 10 years.  He manages the household.  He tells me that recently he is no longer able to cook and they have takeout/convenience foods frequently.    We then focused on their current illness.  We talk about his respiratory failure and the treatment plan.  We talked about his aortic stenosis and the treatment plan, evaluation for possible TAVR.  Mr. Mayotte tells me that he would accept surgery if offered.  We talk about his shoulder fracture.  At this point he is worried that he has minimal use of his arm.  We talked about the benefits of going to [facility] for short-term rehab.  He tells me that if he is unable to do better, he would not want to return home.  He shares his concern about his ability to care for his home and wife.  The natural disease trajectory and expectations at EOL were discussed.  Advanced directives, concepts specific to code status, artifical feeding and hydration, and rehospitalization were considered and discussed.  Ronnie Snyder not talk about the concept of "treat the treatable, but allowing natural passing".  He tells  me that he would not want to be kept alive on life support/machines.  He shares that he has not had these discussions with his family.  I encouraged him to discuss with his family.  Hospice and Palliative Care services outpatient were explained and offered.  We talk about the benefits of continued goals of care discussions, CODE STATUS discussions.  We talked about the benefits of having a nurse practitioner from palliative medicine come once a month to talk about the "what if's, and maybe's".  Ronnie Snyder readily agrees to outpatient palliative services.  Provider of choice, [facility].  Discussed the importance of continued conversation with family and the medical providers regarding overall plan of care and treatment options, ensuring decisions are within the context of the patient's values and GOCs. Questions and concerns were  addressed.  The family was encouraged to call with questions or concerns.  PMT will continue to support holistically.  Conference with attending, bedside nursing staff, transition of care team related to patient condition, needs, goals of care, disposition.   HCPOA  NEXT OF KIN -Ronnie Snyder's wife, Ronnie Snyder, is in poor health.  He names his son, Ronnie Snyder, as his healthcare surrogate.  He had another child, who died about 4 years ago at age 55.    SUMMARY OF RECOMMENDATIONS   At this point continue to treat the treatable Considering CODE STATUS Short-term rehab at [facility] Outpatient palliative services with a [facility]   Code Status/Advance Care Planning: Full code -Ronnie Snyder tells me that he would not want to be kept alive on machines, but has not discussed CODE STATUS with his family.  Symptom Management:  Per hospitalist, no additional needs at this time.  Palliative Prophylaxis:  Frequent Pain Assessment and Oral Care  Additional Recommendations (Limitations, Scope, Preferences): Full Scope Treatment  Psycho-social/Spiritual:  Desire for further Chaplaincy support:no Additional Recommendations: Caregiving  Support/Resources and Education on Hospice  Prognosis:  Unable to determine, based on outcomes.  6 to 12 months or less would not be surprising based on chronic illness burden, decreasing functional status.  Discharge Planning:  Short-term rehab at [facility], outpatient palliative services with [facility]       Primary Diagnoses: Present on Admission:  Acute respiratory failure with hypoxia and hypercapnia (HCC)  PAD (peripheral artery disease) ([place])  AAA (abdominal aortic aneurysm) ([place])   I have reviewed the medical record, interviewed the patient and family, and examined the patient. The following aspects are pertinent.  Past Medical History:  Diagnosis Date   Abdominal aortic aneurysm ([facility])    3.5 cm 10/2016 L-spine CT  (previously 3.0 cm by U/S 02/25/11)   Anxiety    Arthritis    "in my feet" (10/13/2012)   GERD (gastroesophageal reflux disease)    H/O hiatal hernia    Hepatic steatosis    High cholesterol    "at one time; it's fine now" (10/13/2012)   HTN (hypertension)    Hypothyroidism    PAD (peripheral artery disease) (HCC)    Skin cancer    "burned them off my arm and such" (10/13/2012)   Stroke [facility])    Tubular adenoma of colon 10/2019   Social History   Socioeconomic History   Marital status: Married    Spouse name: Not on file   Number of children: 2   Years of education: 12   Highest education level: Not on file  Occupational History   Occupation: retired    Comment: optician  Tobacco  Use   Smoking status: Every Day    Packs/day: 0.25    Years: 57.00    Total pack years: 14.25    Types: Cigarettes   Smokeless tobacco: Never   Tobacco comments:    pt states there is no hope for quitting, pt states he smokes 1-2 cigarettes a day, wife has copd and doesnt smoke around her  Vaping Use   Vaping Use: Never used  Substance and Sexual Activity   Alcohol use: Not Currently   Drug use: No   Sexual activity: Not on file  Other Topics Concern   Not on file  Social History Narrative   ** Merged History Encounter **       Lives with wife, is her caregiver   Social Determinants of Radio broadcast assistant Strain: Not on file  Food Insecurity: Not on file  Transportation Needs: Not on file  Physical Activity: Not on file  Stress: Not on file  Social Connections: Not on file   Family History  Problem Relation Age of Onset   Hypertension Mother    Heart disease Mother    Hypertension Father    Heart disease Father    Hypertension Sister    Hypertension Sister    Colon cancer Neg Hx    Esophageal cancer Neg Hx    Pancreatic cancer Neg Hx    Stomach cancer Neg Hx    Scheduled Meds:  aspirin EC  81 mg Oral Daily   bethanechol  5 mg Oral TID   folic acid  1 mg Oral  Daily   heparin  5,000 Units Subcutaneous Q8H   levothyroxine  75 mcg Oral Q0600   lidocaine  1 patch Transdermal Q24H   mouth rinse  15 mL Mouth Rinse 4 times per day   rosuvastatin  20 mg Oral Daily   sodium chloride flush  3 mL Intravenous Q12H   tamsulosin  0.4 mg Oral Daily   Continuous Infusions:  sodium chloride Stopped (11/27/21 0746)   sodium chloride     PRN Meds:.sodium chloride, sodium chloride, acetaminophen, acetaminophen, docusate sodium, fentaNYL (SUBLIMAZE) injection, ipratropium-albuterol, labetalol, menthol-cetylpyridinium, ondansetron (ZOFRAN) IV, mouth rinse, oxyCODONE, polyethylene glycol, sodium chloride flush, traMADol Medications Prior to Admission:  Prior to Admission medications   Medication Sig Start Date End Date Taking? Authorizing Provider  acetaminophen (TYLENOL) 500 MG tablet Take 1,000 mg by mouth 2 (two) times daily.   Yes [provider]  amLODipine (NORVASC) 10 MG tablet TAKE 1 TABLET(10 MG) BY MOUTH DAILY Patient taking differently: Take 10 mg by mouth daily. 11/22/21  Yes Luetta Nutting, DO  Cholecalciferol (VITAMIN D3 GUMMIES PO) Take 1 tablet by mouth every morning.   Yes [provider]  FEROSUL 325 (65 Fe) MG tablet TAKE 1 TABLET(325 MG) BY MOUTH DAILY WITH BREAKFAST Patient taking differently: Take 325 mg by mouth daily with breakfast. 07/31/21  Yes Luetta Nutting, DO  folic acid (FOLVITE) 1 MG tablet Take 1 mg by mouth every morning.   Yes [provider]  HYDROcodone-acetaminophen (NORCO/VICODIN) 5-325 MG tablet Take 1 tablet by mouth every 6 (six) hours as needed for moderate pain. Patient taking differently: Take 0.5 tablets by mouth every morning. 11/12/21  Yes Marybelle Killings, MD  levothyroxine (SYNTHROID) 75 MCG tablet TAKE 1 TABLET(75 MCG) BY MOUTH DAILY BEFORE AND BREAKFAST Patient taking differently: Take 75 mcg by mouth daily before breakfast. 09/05/21  Yes Luetta Nutting, DO  losartan (COZAAR) 100 MG tablet TAKE 1  TABLET(100 MG) BY MOUTH DAILY Patient taking differently: Take 100 mg by mouth every morning. 10/29/21  Yes Luetta Nutting, DO  methotrexate (RHEUMATREX) 2.5 MG tablet Take 20 mg by mouth every Monday. Caution:Chemotherapy. Protect from light.   Yes [provider]  mineral oil liquid Take 5 mLs by mouth every evening.   Yes [provider]  pantoprazole (PROTONIX) 40 MG tablet TAKE 1 TABLET(40 MG) BY MOUTH DAILY. NEED OFFICE VISIT FOR REFILLS. Patient taking differently: Take 40 mg by mouth daily. 08/16/21  Yes Luetta Nutting, DO  tamsulosin (FLOMAX) 0.4 MG CAPS capsule TAKE 1 CAPSULE(0.4 MG) BY MOUTH DAILY Patient taking differently: Take 0.4 mg by mouth in the morning. 08/09/21  Yes Luetta Nutting, DO  aspirin EC 81 MG tablet Take 1 tablet (81 mg total) by mouth daily. Swallow whole. 12/04/21   Hosie Poisson, MD  bethanechol (URECHOLINE) 5 MG tablet Take 1 tablet (5 mg total) by mouth 3 (three) times daily. 12/03/21   Hosie Poisson, MD  docusate sodium (COLACE) 100 MG capsule Take 1 capsule (100 mg total) by mouth 2 (two) times daily as needed for mild constipation. 12/03/21   Hosie Poisson, MD  ipratropium-albuterol (DUONEB) 0.5-2.5 (3) MG/3ML SOLN Take 3 mLs by nebulization every 6 (six) hours as needed. 12/03/21   Hosie Poisson, MD  oxyCODONE (OXY IR/ROXICODONE) 5 MG immediate release tablet Take 1 tablet (5 mg total) by mouth every 4 (four) hours as needed for up to 3 days for severe pain. 12/03/21 12/06/21  Hosie Poisson, MD  polyethylene glycol (MIRALAX / GLYCOLAX) 17 g packet Take 17 g by mouth daily as needed for moderate constipation. 12/03/21   Hosie Poisson, MD  rosuvastatin (CRESTOR) 20 MG tablet Take 1 tablet (20 mg total) by mouth daily. 12/04/21   Hosie Poisson, MD   Allergies  Allergen Reactions   Hydrocodone-Acetaminophen Nausea Only and Other (See Comments)    Vertigo, also   Atorvastatin Nausea Only   Other Nausea Only and Other (See Comments)    Vertigo and  nausea: "ALL PAINS/NARCOTIC MEDS-CANNOT TOLERATE WELL,"  per Med History prior to 05/23/17   Penicillins Rash    Has patient had a PCN reaction causing immediate rash, facial/tongue/throat swelling, SOB or lightheadedness with hypotension: Unknown Has patient had a PCN reaction causing severe rash involving mucus membranes or skin necrosis: No Has patient had a PCN reaction that required hospitalization: No Has patient had a PCN reaction occurring within the last 10 years: No If all of the above answers are "NO", then may proceed with Cephalosporin use.    Pioglitazone Nausea Only   Review of Systems  Unable to perform ROS: Age    Physical Exam Vitals and nursing note reviewed.  Constitutional:      General: He is not in acute distress.    Appearance: He is ill-appearing.  HENT:     Mouth/Throat:     Mouth: Mucous membranes are moist.  Cardiovascular:     Rate and Rhythm: Normal rate.  Pulmonary:     Effort: Pulmonary effort is normal. No respiratory distress.  Musculoskeletal:        General: No swelling.  Skin:    General: Skin is warm and dry.     Findings: Bruising present.  Neurological:     Mental Status: He is alert and oriented to person, place, and time.  Psychiatric:        Mood and Affect: Mood normal.        Behavior: Behavior  normal.     Vital Signs: BP (!) 147/73 (BP Location: Left Arm)   Pulse 68   Temp 97.8 F (36.6 C) (Oral)   Resp 18   Ht '5\' 7"'$  (1.702 m)   Wt 50.3 kg   SpO2 93%   BMI 17.37 kg/m  Pain Scale: 0-10 POSS *See Group Information*: 1-Acceptable,Awake and alert Pain Score: Asleep   SpO2: SpO2: 93 % O2 Device:SpO2: 93 % O2 Flow Rate: .O2 Flow Rate (L/min): 3 L/min  IO: Intake/output summary:  Intake/Output Summary (Last 24 hours) at 12/03/2021 1211 Last data filed at 12/03/2021 1051 Gross per 24 hour  Intake 50 ml  Output 700 ml  Net -650 ml    LBM: Last BM Date : 12/02/21 Baseline Weight: Weight: 49.9 kg Most recent weight:  Weight: 50.3 kg     Palliative Assessment/Data:   Flowsheet Rows    Flowsheet Row Most Recent Value  Intake Tab   Referral Department Hospitalist  Unit at Time of Referral Cardiac/Telemetry Unit  Palliative Care Primary Diagnosis Cardiac  Date Notified 12/02/21  Palliative Care Type New Palliative care  Reason for referral Clarify Goals of Care  Date of Admission 11/25/21  Date first seen by Palliative Care 12/03/21  # of days Palliative referral response time 1 Day(s)  # of days IP prior to Palliative referral 7  Clinical Assessment   Palliative Performance Scale Score 40%  Pain Max last 24 hours Not able to report  Pain Min Last 24 hours Not able to report  Dyspnea Max Last 24 Hours Not able to report  Dyspnea Min Last 24 hours Not able to report  Psychosocial & Spiritual Assessment   Palliative Care Outcomes        Time In: 1045 Time Out: 1200 Time Total: 75 minutes  Greater than 50%  of this time was spent counseling and coordinating care related to the above assessment and plan.  Signed by: Drue Novel, NP   Please contact Palliative Medicine Team phone at [phone] for questions and concerns.  For individual provider: See Shea Evans

## 2021-12-03 NOTE — Care Management Important Message (Signed)
Important Message  Patient Details  Name: Ronnie Snyder MRN: [id number] Date of Birth: [birth year]   Medicare Important Message Given:  Yes     Shelda Altes 12/03/2021, 11:09 AM

## 2021-12-03 NOTE — TOC Progression Note (Signed)
Transition of Care [facility]) - Progression Note    Patient Details  Name: Ronnie Snyder MRN: [id number] Date of Birth: [birth year]  Transition of Care [facility]) CM/SW [place], [place] Phone Number: 12/03/2021, 10:44 AM  Clinical Narrative:     CSW received consult from MD for outpatient palliative to follow patient at [facility]. CSW spoke with patient who gave CSW permission to make referral to [facility] for outpatient palliative to follow him at [facility]. CSW called [facility] and spoke with [place] and made referral. CSW spoke with Juliann Pulse at [facility] who confirmed patient is good to dc over today if medically ready. CSW informed MD. MD confirmed bipap not needed for patient at SNF. CSW will continue to follow and assist with patients dc planning needs.  Expected Discharge Plan: [place] Barriers to Discharge: Continued Medical Work up  Expected Discharge Plan and Services Expected Discharge Plan: [place]   Discharge Planning Services: CM Consult   Living arrangements for the past 2 months: Single Family Home                                       Social Determinants of Health (SDOH) Interventions    Readmission Risk Interventions     No data to display

## 2021-12-03 NOTE — TOC Transition Note (Signed)
Transition of Care [facility]) - CM/SW Discharge Note   Patient Details  Name: Ronnie Snyder MRN: [id number] Date of Birth: 1940-11-02  Transition of Care [facility]) CM/SW Contact:  Milas Gain, [place] Phone Number: 12/03/2021, 12:19 PM   Clinical Narrative:     Patient will DC to: [facility]  Anticipated DC date: 12/03/2021  Family notified: Administrator, sports by: Corey Harold  ?  Per MD patient ready for DC to [facility] with palliative services to follow . RN, patient, patient's family, Fabio Pierce with [place] facility notified of DC. Discharge Summary sent to facility. RN given number for report tele# [phone] RM# [phone]. DC packet on chart. Ambulance transport requested for patient.  CSW signing off.   Final next level of care: Skilled Nursing Facility Barriers to Discharge: No Barriers Identified   Patient Goals and CMS Choice Patient states their goals for this hospitalization and ongoing recovery are:: SNF CMS Medicare.gov Compare Post Acute Care list provided to:: Patient Choice offered to / list presented to : Patient  Discharge Placement              Patient chooses bed at: [facility] Patient to be transferred to facility by: [place] Name of family member notified: Scott Patient and family notified of of transfer: 12/03/21  Discharge Plan and Services   Discharge Planning Services: CM Consult                                 Social Determinants of Health ([place]) Interventions     Readmission Risk Interventions     No data to display

## 2021-12-04 ENCOUNTER — Institutional Professional Consult (permissible substitution): Payer: Medicare Other | Admitting: Internal Medicine

## 2021-12-04 DIAGNOSIS — E039 Hypothyroidism, unspecified: Secondary | ICD-10-CM | POA: Diagnosis not present

## 2021-12-04 DIAGNOSIS — I2510 Atherosclerotic heart disease of native coronary artery without angina pectoris: Secondary | ICD-10-CM | POA: Diagnosis not present

## 2021-12-04 DIAGNOSIS — I5032 Chronic diastolic (congestive) heart failure: Secondary | ICD-10-CM | POA: Diagnosis not present

## 2021-12-04 DIAGNOSIS — S42211D Unspecified displaced fracture of surgical neck of right humerus, subsequent encounter for fracture with routine healing: Secondary | ICD-10-CM | POA: Diagnosis not present

## 2021-12-04 DIAGNOSIS — J9602 Acute respiratory failure with hypercapnia: Secondary | ICD-10-CM | POA: Diagnosis not present

## 2021-12-04 DIAGNOSIS — I10 Essential (primary) hypertension: Secondary | ICD-10-CM | POA: Diagnosis not present

## 2021-12-04 DIAGNOSIS — I700 Atherosclerosis of aorta: Secondary | ICD-10-CM | POA: Diagnosis not present

## 2021-12-04 DIAGNOSIS — J449 Chronic obstructive pulmonary disease, unspecified: Secondary | ICD-10-CM | POA: Diagnosis not present

## 2021-12-04 DIAGNOSIS — F411 Generalized anxiety disorder: Secondary | ICD-10-CM | POA: Diagnosis not present

## 2021-12-04 DIAGNOSIS — K2270 Barrett's esophagus without dysplasia: Secondary | ICD-10-CM | POA: Diagnosis not present

## 2021-12-04 DIAGNOSIS — E785 Hyperlipidemia, unspecified: Secondary | ICD-10-CM | POA: Diagnosis not present

## 2021-12-04 DIAGNOSIS — J9601 Acute respiratory failure with hypoxia: Secondary | ICD-10-CM | POA: Diagnosis not present

## 2021-12-04 DIAGNOSIS — D500 Iron deficiency anemia secondary to blood loss (chronic): Secondary | ICD-10-CM | POA: Diagnosis not present

## 2021-12-04 DIAGNOSIS — K760 Fatty (change of) liver, not elsewhere classified: Secondary | ICD-10-CM | POA: Diagnosis not present

## 2021-12-04 DIAGNOSIS — J432 Centrilobular emphysema: Secondary | ICD-10-CM | POA: Diagnosis not present

## 2021-12-05 ENCOUNTER — Other Ambulatory Visit: Payer: Self-pay | Admitting: *Deleted

## 2021-12-05 DIAGNOSIS — K5900 Constipation, unspecified: Secondary | ICD-10-CM | POA: Diagnosis not present

## 2021-12-05 NOTE — Patient Outreach (Signed)
Per [facility] eligible member currently resides in  [facility].  Screened for potential THN care management/care coordination services as a benefit of Mr. Moten's insurance plan and PCP.  Mr. Wittwer admitted to SNF on 12/03/21 after hospitalization.  Facility site visit to [facility] skilled nursing facility. Met with Shalonda and Kristy in SW department. Mr. Cando is from home with spouse. Anticipated transition plan is to return home with spouse. Kristy states she will make palliative care referral.  Went to bedside to speak with Mr. Sada. However, he was sleeping soundly. Left TH[facility] Care Management brochure with contact information at bedside.   Will continue to follow.     , MSN, RN,BSN [facility] Post Acute Care Coordinator [phone] ( [employer]) [phone]  (Toll free office)    

## 2021-12-06 ENCOUNTER — Ambulatory Visit: Payer: Medicare Other | Admitting: Family Medicine

## 2021-12-07 DIAGNOSIS — J449 Chronic obstructive pulmonary disease, unspecified: Secondary | ICD-10-CM | POA: Diagnosis not present

## 2021-12-07 DIAGNOSIS — K2270 Barrett's esophagus without dysplasia: Secondary | ICD-10-CM | POA: Diagnosis not present

## 2021-12-07 DIAGNOSIS — J432 Centrilobular emphysema: Secondary | ICD-10-CM | POA: Diagnosis not present

## 2021-12-07 DIAGNOSIS — F411 Generalized anxiety disorder: Secondary | ICD-10-CM | POA: Diagnosis not present

## 2021-12-07 DIAGNOSIS — I700 Atherosclerosis of aorta: Secondary | ICD-10-CM | POA: Diagnosis not present

## 2021-12-07 DIAGNOSIS — I2510 Atherosclerotic heart disease of native coronary artery without angina pectoris: Secondary | ICD-10-CM | POA: Diagnosis not present

## 2021-12-07 DIAGNOSIS — J9602 Acute respiratory failure with hypercapnia: Secondary | ICD-10-CM | POA: Diagnosis not present

## 2021-12-07 DIAGNOSIS — J9601 Acute respiratory failure with hypoxia: Secondary | ICD-10-CM | POA: Diagnosis not present

## 2021-12-07 DIAGNOSIS — I10 Essential (primary) hypertension: Secondary | ICD-10-CM | POA: Diagnosis not present

## 2021-12-07 DIAGNOSIS — I5032 Chronic diastolic (congestive) heart failure: Secondary | ICD-10-CM | POA: Diagnosis not present

## 2021-12-07 DIAGNOSIS — E785 Hyperlipidemia, unspecified: Secondary | ICD-10-CM | POA: Diagnosis not present

## 2021-12-07 DIAGNOSIS — K760 Fatty (change of) liver, not elsewhere classified: Secondary | ICD-10-CM | POA: Diagnosis not present

## 2021-12-10 ENCOUNTER — Encounter (HOSPITAL_COMMUNITY): Payer: Medicare Other

## 2021-12-10 ENCOUNTER — Ambulatory Visit: Payer: Medicare Other

## 2021-12-10 ENCOUNTER — Non-Acute Institutional Stay: Payer: Medicare Other | Admitting: Hospice

## 2021-12-10 DIAGNOSIS — R531 Weakness: Secondary | ICD-10-CM

## 2021-12-10 DIAGNOSIS — E785 Hyperlipidemia, unspecified: Secondary | ICD-10-CM | POA: Diagnosis not present

## 2021-12-10 DIAGNOSIS — J449 Chronic obstructive pulmonary disease, unspecified: Secondary | ICD-10-CM | POA: Diagnosis not present

## 2021-12-10 DIAGNOSIS — F411 Generalized anxiety disorder: Secondary | ICD-10-CM | POA: Diagnosis not present

## 2021-12-10 DIAGNOSIS — J9601 Acute respiratory failure with hypoxia: Secondary | ICD-10-CM

## 2021-12-10 DIAGNOSIS — F32A Depression, unspecified: Secondary | ICD-10-CM | POA: Diagnosis not present

## 2021-12-10 DIAGNOSIS — M79601 Pain in right arm: Secondary | ICD-10-CM | POA: Diagnosis not present

## 2021-12-10 DIAGNOSIS — Z515 Encounter for palliative care: Secondary | ICD-10-CM | POA: Diagnosis not present

## 2021-12-10 DIAGNOSIS — S42211D Unspecified displaced fracture of surgical neck of right humerus, subsequent encounter for fracture with routine healing: Secondary | ICD-10-CM

## 2021-12-10 DIAGNOSIS — J9602 Acute respiratory failure with hypercapnia: Secondary | ICD-10-CM | POA: Diagnosis not present

## 2021-12-10 NOTE — Progress Notes (Signed)
[facility] Consult Note Telephone: [phone]  Fax: [phone]  PATIENT NAME: Ronnie Snyder [address] [place] [address] [phone] (home)  DOB: 1941/02/05 MRN: [id number]  PRIMARY CARE PROVIDER:    Luetta Nutting, DO,  [place]  [address] [place] [address] [phone]  REFERRING PROVIDER:   Luetta Nutting, [place],  [place] [address] [phone]  RESPONSIBLE PARTY:    Contact Information     Name Relation Home Work Mobile   Balis,Scott Son [phone]  [phone]   Chisom, Aust [phone]     [place] Sister   [phone]       I met face to face with patient at facility. Visit to build trust and highlight Palliative Medicine as specialized medical care for people living with serious illness, aimed at facilitating better quality of life through symptoms relief, assisting with advance care planning and complex medical decision making.  Patient endorsed palliative service.  NP called spouse, could not leave voicemail because not activated. ASSESSMENT AND / RECOMMENDATIONS:   Advance Care Planning: Our advance care planning conversation included a discussion about:    The value and importance of advance care planning  Difference between Hospice and Palliative care Exploration of goals of care in the event of a sudden injury or illness  Identification and preparation of a healthcare agent  Review and updating or creation of an  advance directive document . Decision not to resuscitate or to de-escalate disease focused treatments due to poor prognosis.  CODE STATUS: Patient is a full code  Goals of Care: Goals include to maximize quality of life and symptom management  I spent  16 minutes providing this initial consultation. More than 50% of the time in this consultation was spent on counseling patient and coordinating  communication. --------------------------------------------------------------------------------------------------------------------------------------  Symptom Management/Plan: Acute respiratory failure with hypoxia: hospitalized 7/9-7/17/23 for acute resp failure with hypoxia  probably secondary to a combination of CAP and COPD exacerbation. Treated and discharged to SNF for acute rehab. Continue Albuterol as ordered. Avoid triggers. Encourage slow deep breathing. Continue oxygen supplementation 3 L/Min. Displaced right humeral neck fracture:No plans for surgery. Routine healing. Follow up with Ortho as planned.  Weakness: PT OT is ongoing for strengthening and ambulation.  Routine CBC CMP Follow up: Palliative care will continue to follow for complex medical decision making, advance care planning, and clarification of goals. Return 6 weeks or prn. Encouraged to call provider sooner with any concerns.   Family /Caregiver/Community Supports: Patient in SNF for ongoing rehab  HOSPICE ELIGIBILITY/DIAGNOSIS: TBD  Chief Complaint: Initial Palliative care visit  HISTORY OF PRESENT ILLNESS:  Ronnie Snyder is a 81 y.o. year old male  with multiple morbidities requiring close monitoring and with high risk of complications and  mortality: Acute on chronic respiratory failure with hypoxia, displaced right humeral neck fracture, weakness. History obtained from review of EMR, discussion with primary team, caregiver, family and/or Ronnie Snyder.  Review and summarization of Epic records shows history from other than patient. Rest of 10 point ROS asked and negative. Independent interpretation of tests and reviewed as needed, available labs, patient records, imaging, studies and related documents from the EMR.  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   HYPERCHOLESTEROLEMIA 01/21/2008   Anxiety state 03/07/2007   ERECTILE DYSFUNCTION  03/07/2007   DEPRESSION 03/07/2007   HTN (hypertension) 03/07/2007  CAROTID ARTERY STENOSIS 03/07/2007   AAA (abdominal aortic aneurysm) ([facility]) 12/06/2008   ERECTILE DYSFUNCTION, ORGANIC 01/21/2008   ECZEMA 01/24/2010   HIP PAIN, RIGHT 03/07/2007   Back pain 01/21/2008   COUGH 01/24/2009   COLONIC POLYPS, HX OF 03/07/2007   ADENOIDECTOMY, HX OF 03/07/2007   Atherosclerosis of native artery of extremity with intermittent claudication ([facility]) 08/21/2011   Smoker 08/13/2012   Other and unspecified alcohol dependence, unspecified drinking behavior 08/13/2012   Screening for prostate cancer 08/13/2012   Antiplatelet or antithrombotic long-term use 08/13/2012   Hyponatremia 08/21/2012   Aftercare following surgery of the circulatory system, NEC 02/01/2013   PAD (peripheral artery disease) ([facility]) 08/09/2013   Senile purpura ([facility]) 09/23/2013   Lumbar stenosis 05/23/2017   Other specified hypothyroidism 05/29/2018   Urinary hesitancy 10/26/2018   Night sweats 10/26/2018   Symptomatic anemia 08/09/2019   Acute respiratory failure with hypoxia ([facility]) 08/09/2019   Cerebral thrombosis with cerebral infarction 08/12/2019   GI bleed 08/18/2019   Barrett's esophagus without dysplasia 08/30/2019   Iron deficiency anemia due to chronic blood loss 11/09/2019   Benign neoplasm of colon    Gastric AVM    Steatosis of liver 08/19/2019   Fatigue 05/30/2020   Weight loss 01/15/2021   Lumbar compression fracture ([facility]) 05/02/2021   Acute respiratory failure with hypoxia and hypercapnia ([facility]) 11/25/2021   Pressure injury of skin 11/26/2021   Aortic stenosis 11/28/2021   Resolved Ambulatory Problems    Diagnosis Date Noted   SMOKER 03/07/2007   GERD 03/07/2007   Sebaceous cyst 01/24/2009   Pain of thigh 06/15/2012   Pain in limb-Left calf 02/01/2013   Pain in joint, lower leg 10/03/2015   Anemia 10/02/2016   Hyperglycemia 01/22/2018   Black stools 11/09/2019   Past Medical History:  Diagnosis  Date   Abdominal aortic aneurysm (HCC)    Anxiety    Arthritis    GERD (gastroesophageal reflux disease)    H/O hiatal hernia    Hepatic steatosis    High cholesterol    Hypothyroidism    Skin cancer    Stroke ([facility])    Tubular adenoma of colon 10/2019    SOCIAL HX:  Social History   Tobacco Use   Smoking status: Every Day    Packs/day: 0.25    Years: 57.00    Total pack years: 14.25    Types: Cigarettes   Smokeless tobacco: Never   Tobacco comments:    pt states there is no hope for quitting, pt states he smokes 1-2 cigarettes a day, wife has copd and doesnt smoke around her  Substance Use Topics   Alcohol use: Not Currently     FAMILY HX:  Family History  Problem Relation Age of Onset   Hypertension Mother    Heart disease Mother    Hypertension Father    Heart disease Father    Hypertension Sister    Hypertension Sister    Colon cancer Neg Hx    Esophageal cancer Neg Hx    Pancreatic cancer Neg Hx    Stomach cancer Neg Hx       ALLERGIES:  Allergies  Allergen Reactions   Hydrocodone-Acetaminophen Nausea Only and Other (See Comments)    Vertigo, also   Atorvastatin Nausea Only   Other Nausea Only and Other (See Comments)    Vertigo and nausea: "ALL PAINS/NARCOTIC MEDS-CANNOT TOLERATE WELL,"  per Med History prior to 05/23/17   Penicillins Rash    Has patient had a  PCN reaction causing immediate rash, facial/tongue/throat swelling, SOB or lightheadedness with hypotension: Unknown Has patient had a PCN reaction causing severe rash involving mucus membranes or skin necrosis: No Has patient had a PCN reaction that required hospitalization: No Has patient had a PCN reaction occurring within the last 10 years: No If all of the above answers are "NO", then may proceed with Cephalosporin use.    Pioglitazone Nausea Only      PERTINENT MEDICATIONS:  Outpatient Encounter Medications as of 12/10/2021  Medication Sig   acetaminophen (TYLENOL) 500 MG tablet Take  1,000 mg by mouth 2 (two) times daily.   aspirin EC 81 MG tablet Take 1 tablet (81 mg total) by mouth daily. Swallow whole.   bethanechol (URECHOLINE) 5 MG tablet Take 1 tablet (5 mg total) by mouth 3 (three) times daily.   Cholecalciferol (VITAMIN D3 GUMMIES PO) Take 1 tablet by mouth every morning.   docusate sodium (COLACE) 100 MG capsule Take 1 capsule (100 mg total) by mouth 2 (two) times daily as needed for mild constipation.   FEROSUL 325 (65 Fe) MG tablet TAKE 1 TABLET(325 MG) BY MOUTH DAILY WITH BREAKFAST (Patient taking differently: Take 325 mg by mouth daily with breakfast.)   folic acid (FOLVITE) 1 MG tablet Take 1 mg by mouth every morning.   ipratropium-albuterol (DUONEB) 0.5-2.5 (3) MG/3ML SOLN Take 3 mLs by nebulization every 6 (six) hours as needed.   levothyroxine (SYNTHROID) 75 MCG tablet TAKE 1 TABLET(75 MCG) BY MOUTH DAILY BEFORE AND BREAKFAST (Patient taking differently: Take 75 mcg by mouth daily before breakfast.)   methotrexate (RHEUMATREX) 2.5 MG tablet Take 20 mg by mouth every Monday. Caution:Chemotherapy. Protect from light.   mineral oil liquid Take 5 mLs by mouth every evening.   pantoprazole (PROTONIX) 40 MG tablet TAKE 1 TABLET(40 MG) BY MOUTH DAILY. NEED OFFICE VISIT FOR REFILLS. (Patient taking differently: Take 40 mg by mouth daily.)   polyethylene glycol (MIRALAX / GLYCOLAX) 17 g packet Take 17 g by mouth daily as needed for moderate constipation.   rosuvastatin (CRESTOR) 20 MG tablet Take 1 tablet (20 mg total) by mouth daily.   tamsulosin (FLOMAX) 0.4 MG CAPS capsule TAKE 1 CAPSULE(0.4 MG) BY MOUTH DAILY (Patient taking differently: Take 0.4 mg by mouth in the morning.)   No facility-administered encounter medications on file as of 12/10/2021.     Thank you for the opportunity to participate in the care of Mr. Raffield.  The palliative care team will continue to follow. Please call our office at [phone] if we can be of additional assistance.   Note:  Portions of this note were generated with Lobbyist. Dictation errors may occur despite best attempts at proofreading.  Teodoro Spray, NP

## 2021-12-12 ENCOUNTER — Other Ambulatory Visit: Payer: Self-pay | Admitting: *Deleted

## 2021-12-12 DIAGNOSIS — R262 Difficulty in walking, not elsewhere classified: Secondary | ICD-10-CM | POA: Diagnosis not present

## 2021-12-12 DIAGNOSIS — M79601 Pain in right arm: Secondary | ICD-10-CM | POA: Diagnosis not present

## 2021-12-12 DIAGNOSIS — R5381 Other malaise: Secondary | ICD-10-CM | POA: Diagnosis not present

## 2021-12-12 NOTE — Patient Outreach (Signed)

## 2021-12-13 DIAGNOSIS — M79601 Pain in right arm: Secondary | ICD-10-CM | POA: Diagnosis not present

## 2021-12-13 DIAGNOSIS — K0889 Other specified disorders of teeth and supporting structures: Secondary | ICD-10-CM | POA: Diagnosis not present

## 2021-12-13 DIAGNOSIS — K08109 Complete loss of teeth, unspecified cause, unspecified class: Secondary | ICD-10-CM | POA: Diagnosis not present

## 2021-12-13 DIAGNOSIS — S42211D Unspecified displaced fracture of surgical neck of right humerus, subsequent encounter for fracture with routine healing: Secondary | ICD-10-CM | POA: Diagnosis not present

## 2021-12-13 DIAGNOSIS — E46 Unspecified protein-calorie malnutrition: Secondary | ICD-10-CM | POA: Diagnosis not present

## 2021-12-14 DIAGNOSIS — R339 Retention of urine, unspecified: Secondary | ICD-10-CM | POA: Diagnosis not present

## 2021-12-14 DIAGNOSIS — S42211D Unspecified displaced fracture of surgical neck of right humerus, subsequent encounter for fracture with routine healing: Secondary | ICD-10-CM | POA: Diagnosis not present

## 2021-12-14 DIAGNOSIS — M79601 Pain in right arm: Secondary | ICD-10-CM | POA: Diagnosis not present

## 2021-12-14 DIAGNOSIS — Z978 Presence of other specified devices: Secondary | ICD-10-CM | POA: Diagnosis not present

## 2021-12-17 DIAGNOSIS — S42211D Unspecified displaced fracture of surgical neck of right humerus, subsequent encounter for fracture with routine healing: Secondary | ICD-10-CM | POA: Diagnosis not present

## 2021-12-17 DIAGNOSIS — M79601 Pain in right arm: Secondary | ICD-10-CM | POA: Diagnosis not present

## 2021-12-17 DIAGNOSIS — M545 Low back pain, unspecified: Secondary | ICD-10-CM | POA: Diagnosis not present

## 2021-12-19 ENCOUNTER — Other Ambulatory Visit: Payer: Self-pay | Admitting: *Deleted

## 2021-12-19 ENCOUNTER — Telehealth: Payer: Self-pay

## 2021-12-19 DIAGNOSIS — R262 Difficulty in walking, not elsewhere classified: Secondary | ICD-10-CM | POA: Diagnosis not present

## 2021-12-19 DIAGNOSIS — M79601 Pain in right arm: Secondary | ICD-10-CM | POA: Diagnosis not present

## 2021-12-19 DIAGNOSIS — R5381 Other malaise: Secondary | ICD-10-CM | POA: Diagnosis not present

## 2021-12-19 NOTE — Patient Outreach (Signed)

## 2021-12-19 NOTE — Telephone Encounter (Signed)
I spoke with the pt's son, [name], and he said the pt has had a slight improvement while in rehab but progress is very slow. The pt is walking with assistance and requires someone to assist him to get out of bed. Per Scott's description the pt also has a foley catheter.  I advised [name] that the pt's appointment with Dr Ali Lowe is 8/25 and that I will check in with him in 2 weeks to see how the pt is progressing.  Scott agreed with plan.

## 2021-12-25 ENCOUNTER — Non-Acute Institutional Stay: Payer: Medicare Other | Admitting: Hospice

## 2021-12-25 DIAGNOSIS — F330 Major depressive disorder, recurrent, mild: Secondary | ICD-10-CM | POA: Diagnosis not present

## 2021-12-25 DIAGNOSIS — J9601 Acute respiratory failure with hypoxia: Secondary | ICD-10-CM

## 2021-12-25 DIAGNOSIS — M25511 Pain in right shoulder: Secondary | ICD-10-CM

## 2021-12-25 DIAGNOSIS — K5901 Slow transit constipation: Secondary | ICD-10-CM

## 2021-12-25 DIAGNOSIS — R531 Weakness: Secondary | ICD-10-CM

## 2021-12-25 DIAGNOSIS — Z515 Encounter for palliative care: Secondary | ICD-10-CM | POA: Diagnosis not present

## 2021-12-25 DIAGNOSIS — S42211D Unspecified displaced fracture of surgical neck of right humerus, subsequent encounter for fracture with routine healing: Secondary | ICD-10-CM | POA: Diagnosis not present

## 2021-12-25 DIAGNOSIS — F5109 Other insomnia not due to a substance or known physiological condition: Secondary | ICD-10-CM | POA: Diagnosis not present

## 2021-12-25 NOTE — Progress Notes (Signed)
[facility] Consult Note Telephone: [phone]  Fax: [phone]  PATIENT NAME: Ronnie Snyder [address] [place] [address] [phone] (home)  DOB: 07-23-1940 MRN: [id number]  PRIMARY CARE PROVIDER:    Luetta Nutting, DO,  [place]  [address] [place] [address] [phone]  REFERRING PROVIDER:   Luetta Nutting, [place],  [place] [address] [phone]  RESPONSIBLE PARTY:    Contact Information     Name Relation Home Work Mobile   Lorino,Scott Son [phone]  [phone]   Jakorey, Mcconathy [phone]     [place] Sister   [phone]       I met face to face with patient at facility. Visit to build trust and highlight Palliative Medicine as specialized medical care for people living with serious illness, aimed at facilitating better quality of life through symptoms relief, assisting with advance care planning and complex medical decision making.   ASSESSMENT AND / RECOMMENDATIONS:   CODE STATUS: Patient is a full code  Goals of Care: Goals include to maximize quality of life and symptom management  Symptom Management/Plan: Acute respiratory failure with hypoxia: Continue oxygen supplementation 3 L/Min continuous and Albuterol breathing treatment as ordered. Patient was hospitalized 7/9-7/17/23 for acute resp failure with hypoxia  probably secondary to a combination of CAP and COPD flare. Avoid triggers. Encourage slow deep breathing.  Displaced right humeral neck fracture: Routine healing.  PT/OT following.  No plans for surgery. Follow up with Ortho as planned.  Pain in Right shoulder: related to right humeral fracture. Managed with Tylenol extra strength 650 mg BID. Effective.  Weakness: PT OT is ongoing for strengthening and ambulation.  Constipation: Recommendation: Take Senna S 8.6 -50 mg daily and Miralax daily.  Routine  CBC CMP Follow up: Palliative care will continue to follow for complex medical decision making, advance care planning, and clarification of goals. Return 6 weeks or prn. Encouraged to call provider sooner with any concerns.   Family /Caregiver/Community Supports: Patient in SNF for ongoing rehab  HOSPICE ELIGIBILITY/DIAGNOSIS: TBD  Chief Complaint: Follow up visit  HISTORY OF PRESENT ILLNESS:  [name] is a 81 y.o. year old male  with multiple morbidities requiring close monitoring and with high risk of complications and  mortality: Acute on chronic respiratory failure with hypoxia, displaced right humeral neck fracture, weakness, constipation, pain in right shoulder. Patient denied pain during visit; reports Extra Tylenol Tylenol is effective for R shoulder pain. History obtained from review of EMR, discussion with primary team, caregiver, family and/or [name].  Review and summarization of Epic records shows history from other than patient. Rest of 10 point ROS asked and negative. Reviewed  needed, available labs, patient records, imaging, studies and related documents from the EMR.  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   HYPERCHOLESTEROLEMIA 01/21/2008   Anxiety state 03/07/2007   ERECTILE DYSFUNCTION 03/07/2007   DEPRESSION 03/07/2007   HTN (hypertension) 03/07/2007   CAROTID ARTERY STENOSIS 03/07/2007   AAA (abdominal aortic aneurysm) ([facility]) 12/06/2008   ERECTILE DYSFUNCTION, ORGANIC 01/21/2008   ECZEMA 01/24/2010   HIP PAIN, RIGHT 03/07/2007   Back pain 01/21/2008   COUGH 01/24/2009   COLONIC POLYPS, HX OF 03/07/2007   ADENOIDECTOMY, HX OF 03/07/2007   Atherosclerosis of native artery of extremity with intermittent claudication ([facility]) 08/21/2011   Smoker 08/13/2012   Other and unspecified  alcohol dependence, unspecified drinking behavior 08/13/2012   Screening for prostate cancer 08/13/2012    Antiplatelet or antithrombotic long-term use 08/13/2012   Hyponatremia 08/21/2012   Aftercare following surgery of the circulatory system, NEC 02/01/2013   PAD (peripheral artery disease) ([facility]) 08/09/2013   Senile purpura ([facility]) 09/23/2013   Lumbar stenosis 05/23/2017   Other specified hypothyroidism 05/29/2018   Urinary hesitancy 10/26/2018   Night sweats 10/26/2018   Symptomatic anemia 08/09/2019   Acute respiratory failure with hypoxia ([facility]) 08/09/2019   Cerebral thrombosis with cerebral infarction 08/12/2019   GI bleed 08/18/2019   Barrett's esophagus without dysplasia 08/30/2019   Iron deficiency anemia due to chronic blood loss 11/09/2019   Benign neoplasm of colon    Gastric AVM    Steatosis of liver 08/19/2019   Fatigue 05/30/2020   Weight loss 01/15/2021   Lumbar compression fracture ([facility]) 05/02/2021   Acute respiratory failure with hypoxia and hypercapnia ([place]) 11/25/2021   Pressure injury of skin 11/26/2021   Aortic stenosis 11/28/2021   Resolved Ambulatory Problems    Diagnosis Date Noted   SMOKER 03/07/2007   GERD 03/07/2007   Sebaceous cyst 01/24/2009   Pain of thigh 06/15/2012   Pain in limb-Left calf 02/01/2013   Pain in joint, lower leg 10/03/2015   Anemia 10/02/2016   Hyperglycemia 01/22/2018   Black stools 11/09/2019   Past Medical History:  Diagnosis Date   Abdominal aortic aneurysm (HCC)    Anxiety    Arthritis    GERD (gastroesophageal reflux disease)    H/O hiatal hernia    Hepatic steatosis    High cholesterol    Hypothyroidism    Skin cancer    Stroke ([facility])    Tubular adenoma of colon 10/2019    SOCIAL HX:  Social History   Tobacco Use   Smoking status: Every Day    Packs/day: 0.25    Years: 57.00    Total pack years: 14.25    Types: Cigarettes   Smokeless tobacco: Never   Tobacco comments:    pt states there is no hope for quitting, pt states he smokes 1-2 cigarettes a day, wife has copd and doesnt smoke around her  Substance  Use Topics   Alcohol use: Not Currently     FAMILY HX:  Family History  Problem Relation Age of Onset   Hypertension Mother    Heart disease Mother    Hypertension Father    Heart disease Father    Hypertension Sister    Hypertension Sister    Colon cancer Neg Hx    Esophageal cancer Neg Hx    Pancreatic cancer Neg Hx    Stomach cancer Neg Hx       ALLERGIES:  Allergies  Allergen Reactions   Hydrocodone-Acetaminophen Nausea Only and Other (See Comments)    Vertigo, also   Atorvastatin Nausea Only   Other Nausea Only and Other (See Comments)    Vertigo and nausea: "ALL PAINS/NARCOTIC MEDS-CANNOT TOLERATE WELL,"  per Med History prior to 05/23/17   Penicillins Rash    Has patient had a PCN reaction causing immediate rash, facial/tongue/throat swelling, SOB or lightheadedness with hypotension: Unknown Has patient had a PCN reaction causing severe rash involving mucus membranes or skin necrosis: No Has patient had a PCN reaction that required hospitalization: No Has patient had a PCN reaction occurring within the last 10 years: No If all of the above answers are "NO", then may proceed with Cephalosporin use.    Pioglitazone Nausea  Only      PERTINENT MEDICATIONS:  Outpatient Encounter Medications as of 12/25/2021  Medication Sig   acetaminophen (TYLENOL) 500 MG tablet Take 1,000 mg by mouth 2 (two) times daily.   aspirin EC 81 MG tablet Take 1 tablet (81 mg total) by mouth daily. Swallow whole.   bethanechol (URECHOLINE) 5 MG tablet Take 1 tablet (5 mg total) by mouth 3 (three) times daily.   Cholecalciferol (VITAMIN D3 GUMMIES PO) Take 1 tablet by mouth every morning.   docusate sodium (COLACE) 100 MG capsule Take 1 capsule (100 mg total) by mouth 2 (two) times daily as needed for mild constipation.   FEROSUL 325 (65 Fe) MG tablet TAKE 1 TABLET(325 MG) BY MOUTH DAILY WITH BREAKFAST (Patient taking differently: Take 325 mg by mouth daily with breakfast.)   folic acid (FOLVITE)  1 MG tablet Take 1 mg by mouth every morning.   ipratropium-albuterol (DUONEB) 0.5-2.5 (3) MG/3ML SOLN Take 3 mLs by nebulization every 6 (six) hours as needed.   levothyroxine (SYNTHROID) 75 MCG tablet TAKE 1 TABLET(75 MCG) BY MOUTH DAILY BEFORE AND BREAKFAST (Patient taking differently: Take 75 mcg by mouth daily before breakfast.)   methotrexate (RHEUMATREX) 2.5 MG tablet Take 20 mg by mouth every Monday. Caution:Chemotherapy. Protect from light.   mineral oil liquid Take 5 mLs by mouth every evening.   pantoprazole (PROTONIX) 40 MG tablet TAKE 1 TABLET(40 MG) BY MOUTH DAILY. NEED OFFICE VISIT FOR REFILLS. (Patient taking differently: Take 40 mg by mouth daily.)   polyethylene glycol (MIRALAX / GLYCOLAX) 17 g packet Take 17 g by mouth daily as needed for moderate constipation.   rosuvastatin (CRESTOR) 20 MG tablet Take 1 tablet (20 mg total) by mouth daily.   tamsulosin (FLOMAX) 0.4 MG CAPS capsule TAKE 1 CAPSULE(0.4 MG) BY MOUTH DAILY (Patient taking differently: Take 0.4 mg by mouth in the morning.)   No facility-administered encounter medications on file as of 12/25/2021.   I spent 45 minutes providing this consultation; this includes time spent with patient/family, chart review and documentation. More than 50% of the time in this consultation was spent on counseling and coordinating communication   Thank you for the opportunity to participate in the care of Mr. Dockstader.  The palliative care team will continue to follow. Please call our office at [phone] if we can be of additional assistance.   Note: Portions of this note were generated with Lobbyist. Dictation errors may occur despite best attempts at proofreading.  Teodoro Spray, NP

## 2021-12-26 ENCOUNTER — Other Ambulatory Visit: Payer: Self-pay | Admitting: *Deleted

## 2021-12-26 DIAGNOSIS — R262 Difficulty in walking, not elsewhere classified: Secondary | ICD-10-CM | POA: Diagnosis not present

## 2021-12-26 DIAGNOSIS — M79601 Pain in right arm: Secondary | ICD-10-CM | POA: Diagnosis not present

## 2021-12-26 DIAGNOSIS — R5381 Other malaise: Secondary | ICD-10-CM | POA: Diagnosis not present

## 2021-12-26 NOTE — Patient Outreach (Signed)
[facility] Coordinator follow up. Mr. Boomhower remains in [facility] SNF.  Facility site visit to [facility]. Met with Dipam, therapy manager who reports therapy continues to work with Mr. Mcdonagh for the goal to return home. Poor endurance. Not physically safe to return home at this point. Goal to be more independent prior to return home. Right shoulder remain in sling. [facility] palliative following while in SNF.   Will continue to follow while Mr. Pankonin remains in SNF.  Will plan outreach to Mrs. Gaede to discuss transition plans.   Marthenia Rolling, MSN, RN,BSN [place] Acute Care Coordinator [phone] [facility]) [phone]  (Toll free office)

## 2021-12-27 DIAGNOSIS — R339 Retention of urine, unspecified: Secondary | ICD-10-CM | POA: Diagnosis not present

## 2021-12-27 DIAGNOSIS — M545 Low back pain, unspecified: Secondary | ICD-10-CM | POA: Diagnosis not present

## 2021-12-27 DIAGNOSIS — M79601 Pain in right arm: Secondary | ICD-10-CM | POA: Diagnosis not present

## 2021-12-27 DIAGNOSIS — Z978 Presence of other specified devices: Secondary | ICD-10-CM | POA: Diagnosis not present

## 2021-12-31 ENCOUNTER — Other Ambulatory Visit: Payer: Self-pay | Admitting: *Deleted

## 2021-12-31 NOTE — Patient Outreach (Signed)
[facility]- Acute Care Coordinator follow up. Mr. Puccinelli resides in [facility] SNF.   Telephone call made to [facility] (son/DPR) [phone]. Patient identifiers confirmed. [name] states he is currently visiting Mr. Hammerschmidt in SNF. However,  states he is able to speak with Probation officer. [name] endorses transition plan is to return home. States Mr. Tiemann was independent and still drove prior to admission. States goal is for Mr. Jindra to get somewhat back to where he was physically before going into the hospital. States another barrier is his right arm is broken. States ortho appointment is scheduled for 01/22/22. Also still has catheter.  Discussed writer will continue to follow while Mr. Nesheim resides in SNF. Will email Scott writer's contact information along with [facility] brochure.  Will continue to follow while Mr. Cottrill resides in SNF.   Marthenia Rolling, MSN, RN,BSN [place] Acute Care Coordinator [phone] [facility]) [phone]  (Toll free office)

## 2022-01-01 ENCOUNTER — Other Ambulatory Visit: Payer: Self-pay

## 2022-01-01 ENCOUNTER — Encounter (HOSPITAL_COMMUNITY): Payer: Self-pay | Admitting: *Deleted

## 2022-01-01 ENCOUNTER — Emergency Department (HOSPITAL_COMMUNITY)
Admission: EM | Admit: 2022-01-01 | Discharge: 2022-01-01 | Disposition: A | Discharge status: Home / Self Care | Payer: Medicare Other | Attending: Emergency Medicine | Admitting: Emergency Medicine

## 2022-01-01 DIAGNOSIS — I119 Hypertensive heart disease without heart failure: Secondary | ICD-10-CM | POA: Insufficient documentation

## 2022-01-01 DIAGNOSIS — Z7901 Long term (current) use of anticoagulants: Secondary | ICD-10-CM | POA: Diagnosis not present

## 2022-01-01 DIAGNOSIS — E039 Hypothyroidism, unspecified: Secondary | ICD-10-CM | POA: Diagnosis not present

## 2022-01-01 DIAGNOSIS — I509 Heart failure, unspecified: Secondary | ICD-10-CM | POA: Diagnosis not present

## 2022-01-01 DIAGNOSIS — X58XXXA Exposure to other specified factors, initial encounter: Secondary | ICD-10-CM | POA: Insufficient documentation

## 2022-01-01 DIAGNOSIS — S00412A Abrasion of left ear, initial encounter: Secondary | ICD-10-CM | POA: Insufficient documentation

## 2022-01-01 DIAGNOSIS — S01319A Laceration without foreign body of unspecified ear, initial encounter: Secondary | ICD-10-CM | POA: Diagnosis not present

## 2022-01-01 DIAGNOSIS — J9601 Acute respiratory failure with hypoxia: Secondary | ICD-10-CM | POA: Diagnosis not present

## 2022-01-01 DIAGNOSIS — Z7982 Long term (current) use of aspirin: Secondary | ICD-10-CM | POA: Diagnosis not present

## 2022-01-01 MED ORDER — BACITRACIN ZINC 500 UNIT/GM EX OINT
TOPICAL_OINTMENT | Freq: Once | CUTANEOUS | Status: DC
Start: 2022-01-01 — End: 2022-01-01
  Filled 2022-01-01: qty 28.4

## 2022-01-01 NOTE — ED Triage Notes (Signed)
Pt arrives via [facility] from [facility], per report he has cut on the left ear from a razor blade while shaving afrin over it. Bleeding controlled on arrival. Not on blood thinners. 18rr, 180/80, 2 liters chronic, 67hr, pain 7/10

## 2022-01-01 NOTE — ED Notes (Signed)
Called [facility] to transport patient to [facility].

## 2022-01-01 NOTE — Discharge Instructions (Signed)
Follow-up with your PCP as needed.    Return if development of any new or worsening symptoms

## 2022-01-01 NOTE — ED Notes (Signed)
Bleeding controlled to the left ear.

## 2022-01-01 NOTE — ED Notes (Signed)
[facility] has been called for patient. Pt waiting in lobby with his family/son until they get here.

## 2022-01-01 NOTE — ED Provider Notes (Signed)
[place] EMERGENCY DEPARTMENT Provider Note   CSN: [id number] Arrival date & time: 01/01/22  0636     History  No chief complaint on file.   Ronnie Snyder is a 81 y.o. male.  Patient with history of GERD, PAD, AAA, hypertension, hypothyroidism, hyperlipidemia, stroke, and CHF presents today from [facility] with complaints of ear abrasion. He states that earlier today he was trying to shave his ear hairs with a razor blade and accidentally nicked his ear. States that he is anticoagulated and his facility was unable to get the bleeding to stop and subsequently called EMS for transport here. EMS reportedly put Afrin on the wound and it stopped. Of note, patient is on 2L [place] at baseline. Denies any injuries or complaints.  The history is provided by the patient. No language interpreter was used.       Home Medications Prior to Admission medications   Medication Sig Start Date End Date Taking? Authorizing Provider  acetaminophen (TYLENOL) 500 MG tablet Take 1,000 mg by mouth 2 (two) times daily.    [provider]  aspirin EC 81 MG tablet Take 1 tablet (81 mg total) by mouth daily. Swallow whole. 12/04/21   Hosie Poisson, MD  bethanechol (URECHOLINE) 5 MG tablet Take 1 tablet (5 mg total) by mouth 3 (three) times daily. 12/03/21   Hosie Poisson, MD  Cholecalciferol (VITAMIN D3 GUMMIES PO) Take 1 tablet by mouth every morning.    [provider]  docusate sodium (COLACE) 100 MG capsule Take 1 capsule (100 mg total) by mouth 2 (two) times daily as needed for mild constipation. 12/03/21   Hosie Poisson, MD  FEROSUL 325 (65 Fe) MG tablet TAKE 1 TABLET(325 MG) BY MOUTH DAILY WITH BREAKFAST Patient taking differently: Take 325 mg by mouth daily with breakfast. 07/31/21   Luetta Nutting, DO  folic acid (FOLVITE) 1 MG tablet Take 1 mg by mouth every morning.    [provider]  ipratropium-albuterol (DUONEB) 0.5-2.5 (3) MG/3ML SOLN Take 3 mLs  by nebulization every 6 (six) hours as needed. 12/03/21   Hosie Poisson, MD  levothyroxine (SYNTHROID) 75 MCG tablet TAKE 1 TABLET(75 MCG) BY MOUTH DAILY BEFORE AND BREAKFAST Patient taking differently: Take 75 mcg by mouth daily before breakfast. 09/05/21   Luetta Nutting, DO  methotrexate (RHEUMATREX) 2.5 MG tablet Take 20 mg by mouth every Monday. Caution:Chemotherapy. Protect from light.    [provider]  mineral oil liquid Take 5 mLs by mouth every evening.    [provider]  pantoprazole (PROTONIX) 40 MG tablet TAKE 1 TABLET(40 MG) BY MOUTH DAILY. NEED OFFICE VISIT FOR REFILLS. Patient taking differently: Take 40 mg by mouth daily. 08/16/21   Luetta Nutting, DO  polyethylene glycol (MIRALAX / GLYCOLAX) 17 g packet Take 17 g by mouth daily as needed for moderate constipation. 12/03/21   Hosie Poisson, MD  rosuvastatin (CRESTOR) 20 MG tablet Take 1 tablet (20 mg total) by mouth daily. 12/04/21   Hosie Poisson, MD  tamsulosin (FLOMAX) 0.4 MG CAPS capsule TAKE 1 CAPSULE(0.4 MG) BY MOUTH DAILY Patient taking differently: Take 0.4 mg by mouth in the morning. 08/09/21   Luetta Nutting, DO      Allergies    Hydrocodone-acetaminophen, Atorvastatin, Other, Penicillins, and Pioglitazone    Review of Systems   Review of Systems  Skin:  Positive for wound.  All other systems reviewed and are negative.   Physical Exam Updated Vital Signs BP (!) 155/79 (BP Location:  Left Arm)   Pulse 70   Temp 98.3 F (36.8 C)   Resp 17   SpO2 98%  Physical Exam Vitals and nursing note reviewed.  Constitutional:      General: He is not in acute distress.    Appearance: Normal appearance. He is normal weight. He is not ill-appearing, toxic-appearing or diaphoretic.  HENT:     Head: Normocephalic and atraumatic.  Cardiovascular:     Rate and Rhythm: Normal rate.  Pulmonary:     Effort: Pulmonary effort is normal. No respiratory distress.  Musculoskeletal:        General: Normal range of  motion.     Cervical back: Normal range of motion.  Skin:    General: Skin is warm and dry.     Comments: Small superficial abrasion noted to the outer helix of the left ear. No active bleeding.  Neurological:     General: No focal deficit present.     Mental Status: He is alert.  Psychiatric:        Mood and Affect: Mood normal.        Behavior: Behavior normal.     ED Results / Procedures / Treatments   Labs (all labs ordered are listed, but only abnormal results are displayed) Labs Reviewed - No data to display  EKG None  Radiology No results found.  Procedures Procedures    Medications Ordered in ED Medications  bacitracin ointment (has no administration in time range)    ED Course/ Medical Decision Making/ A&P                           Medical Decision Making Risk OTC drugs.   Patient presents today after nicking the outer helix of his ear earlier this morning with a razor. Small superficial abrasion noted to same with no active bleeding. EMS placed Afrin on the wound and hemostasis has been maintained since the patient arrived here 4 hours ago. Basic wound care performed and Bacitracin and a bandaid place over the wound. No further emergent concerns at this time. He is stable for discharge, educated on red flag symptoms that would prompt immediate return. Discharged in stable condition.   Final Clinical Impression(s) / ED Diagnoses Final diagnoses:  Ear abrasion, left, initial encounter    Rx / DC Orders ED Discharge Orders     None     An After Visit Summary was printed and given to the patient.     [name] 01/01/22 1020    Sherwood Gambler, MD 01/01/22 1353

## 2022-01-02 ENCOUNTER — Telehealth: Payer: Self-pay

## 2022-01-02 DIAGNOSIS — J9602 Acute respiratory failure with hypercapnia: Secondary | ICD-10-CM | POA: Diagnosis not present

## 2022-01-02 DIAGNOSIS — Z0184 Encounter for antibody response examination: Secondary | ICD-10-CM | POA: Diagnosis not present

## 2022-01-02 DIAGNOSIS — J9601 Acute respiratory failure with hypoxia: Secondary | ICD-10-CM | POA: Diagnosis not present

## 2022-01-02 NOTE — Telephone Encounter (Signed)
I contacted the pt's son, [name], to discuss how the pt is progressing at rehab.  Per [name] the pt is on oxygen, has foley catheter and is scheduled to see Dr Iran Planas with orthopedic on 9/5 to follow-up on broken right arm. [name] is overwhelmed with the pt's medical issues and the slow progress the pt is making in rehab.  At this time he would like to cancel the pt's appointment with Dr Ali Lowe on 8/25. I advised him that I will plan to touch base again in September to see how the pt is doing and can discuss rescheduling appointment at that time.  Scott agreed with plan.

## 2022-01-04 DIAGNOSIS — I10 Essential (primary) hypertension: Secondary | ICD-10-CM | POA: Diagnosis not present

## 2022-01-04 DIAGNOSIS — I2510 Atherosclerotic heart disease of native coronary artery without angina pectoris: Secondary | ICD-10-CM | POA: Diagnosis not present

## 2022-01-04 DIAGNOSIS — J449 Chronic obstructive pulmonary disease, unspecified: Secondary | ICD-10-CM | POA: Diagnosis not present

## 2022-01-04 DIAGNOSIS — J9601 Acute respiratory failure with hypoxia: Secondary | ICD-10-CM | POA: Diagnosis not present

## 2022-01-08 DIAGNOSIS — S42301A Unspecified fracture of shaft of humerus, right arm, initial encounter for closed fracture: Secondary | ICD-10-CM | POA: Diagnosis not present

## 2022-01-09 ENCOUNTER — Encounter: Payer: Self-pay | Admitting: General Practice

## 2022-01-11 ENCOUNTER — Institutional Professional Consult (permissible substitution): Payer: Medicare Other | Admitting: Internal Medicine

## 2022-01-16 ENCOUNTER — Other Ambulatory Visit: Payer: Self-pay | Admitting: *Deleted

## 2022-01-16 NOTE — Patient Outreach (Signed)
[place] Coordinator follow up.   Facility site visit to [facility]. Met with therapy manager and SNF social workers. Mr. Caravello will likely return home soon. Therapy to plan home visit.  Son [name] is primary contact.   Met with Mr. Gervin at bedside. Discussed [facility] follow up. Discussed Probation officer will contact his son [name] to discuss further. Provided [facility] Care Management brochure, 24-hr nurse advice line magnet, and writer's contact information.   Will plan outreach to son [name] ([place]).  Marthenia Rolling, MSN, RN,BSN [place] Acute Care Coordinator [phone] [facility]) [phone]  (Toll free office)

## 2022-01-21 DIAGNOSIS — F419 Anxiety disorder, unspecified: Secondary | ICD-10-CM | POA: Diagnosis not present

## 2022-01-21 DIAGNOSIS — F5105 Insomnia due to other mental disorder: Secondary | ICD-10-CM | POA: Diagnosis not present

## 2022-01-21 DIAGNOSIS — F330 Major depressive disorder, recurrent, mild: Secondary | ICD-10-CM | POA: Diagnosis not present

## 2022-01-22 DIAGNOSIS — M25511 Pain in right shoulder: Secondary | ICD-10-CM | POA: Diagnosis not present

## 2022-01-23 DIAGNOSIS — R1312 Dysphagia, oropharyngeal phase: Secondary | ICD-10-CM | POA: Diagnosis not present

## 2022-01-25 ENCOUNTER — Non-Acute Institutional Stay: Payer: Medicare Other

## 2022-01-25 VITALS — BP 146/78 | HR 65 | Temp 97.4°F

## 2022-01-25 DIAGNOSIS — Z515 Encounter for palliative care: Secondary | ICD-10-CM

## 2022-01-25 NOTE — Progress Notes (Signed)
PATIENT NAME: Ronnie Snyder DOB: July 30, 1940 MRN: [id number]  PRIMARY CARE PROVIDER: Luetta Nutting, DO  RESPONSIBLE PARTY:  Acct ID - Guarantor Home Phone Work Phone Relationship Acct Type  [id number] Ronnie Snyder,* [phone]  Self P/F     [address], [place], [place] [address]  Met with patient in his room.  Just completed therapy this am.  Discharge Planning:  Patient may discharge home in the next couple of weeks.  There is discussion on foley cath removal soon.  Patient is concerned about being home with his wife.  They are both debilitated and need assistance.  Patient is concerned about getting into/out of his home.  He has 2 steps with no rails.  We talked about a possible ramp that would allow better accessibility for both he and his wife. Son is involved in care and patient has discussed his concerns.  Advised facility SW will set up home health services if needed and DME at discharge.  Weakness:  Patient endorses working with PT/OT and strength is improving.  He is now able to ambulate with his walker to the therapy room. No falls reported in the facility.   Weight Loss: Patient endorse taking a supplement once daily. Notes weight has declined over the last years.  Thin and frail in appearance. Temporal wasting/muscle wasting noted.  Patient reports needing softer foods due to swallowing issues.  Patient spoke with dietary during this visit to ensure proper foods are placed on his tray.   Wt  103 lb 9.9 oz (47 kg) 7/23 Wt 113 lb (51.3 kg)05/22/21 Wt 137 lb (62.1 kg) 08/01/20  Ht: 5'7 BMI: 16.1   HISTORY OF PRESENT ILLNESS:  Ronnie Snyder is an 81 year old male with acute on chronic respiratory failure with hypoxia, displaced right humeral neck fracture, weakness, and constipation. Patient is followed by Palliative Care every 4-8 weeks and PRN.   CODE STATUS: Full ADVANCED DIRECTIVES: No MOST FORM: No PPS: 40%   PHYSICAL EXAM:   VITALS: Today's Vitals   01/25/22  1052  BP: (!) 146/78  Pulse: 65  Temp: (!) 97.4 F (36.3 C)  SpO2: 96%  PainSc: 0-No pain    LUNGS: clear to auscultation  CARDIAC: Cor RRR}  EXTREMITIES: - for edema NEURO: positive for gait problems       Lorenza Burton, RN

## 2022-01-29 ENCOUNTER — Other Ambulatory Visit: Payer: Self-pay | Admitting: *Deleted

## 2022-01-29 NOTE — Patient Outreach (Signed)
Ronnie Snyder follow up. Ronnie Snyder resides in [facility].   Telephone call made to (son/DPR) Ronnie Snyder [phone]. Patient identifiers confirmed. Ronnie Snyder states Ronnie Snyder is progressing well with therapy. Has full weight bearing with arm now. Ronnie Snyder states he is looking into private duty agencies. States he is also trying to get HCPOA. Ronnie Snyder was present with [name] at [facility] facility during conversation.   Ronnie Snyder asks Dentist his wife Ronnie Snyder to discuss further. States Ronnie Snyder knows more about these things. States Ronnie Snyder works from home on Mondays and Fridays. Best telephone number is [phone].  Will continue to follow. Will plan outreach to daughter in law per request.    Ronnie Rolling, MSN, RN,BSN [place] [phone] [facility]) [phone]  (Toll free office)

## 2022-01-30 ENCOUNTER — Other Ambulatory Visit: Payer: Self-pay | Admitting: *Deleted

## 2022-01-30 NOTE — Patient Outreach (Signed)
[facility] Post- Acute Care Coordinator follow up.   Facility site visit to [facility]. Met with Marita Kansas, SNF SW who reports Mr. Rasch is slated to discharge home on Saturday, September 16th. Will have home health. [facility] palliative is following.  Will plan outreach again to family.    Marthenia Rolling, MSN, RN,BSN [place] Acute Care Coordinator [phone] [facility]) [phone]  (Toll free office)

## 2022-01-31 DIAGNOSIS — W19XXXA Unspecified fall, initial encounter: Secondary | ICD-10-CM | POA: Diagnosis not present

## 2022-01-31 DIAGNOSIS — M79641 Pain in right hand: Secondary | ICD-10-CM | POA: Diagnosis not present

## 2022-01-31 DIAGNOSIS — S61411A Laceration without foreign body of right hand, initial encounter: Secondary | ICD-10-CM | POA: Diagnosis not present

## 2022-01-31 DIAGNOSIS — M2681 Anterior soft tissue impingement: Secondary | ICD-10-CM | POA: Diagnosis not present

## 2022-02-01 ENCOUNTER — Other Ambulatory Visit: Payer: Self-pay | Admitting: *Deleted

## 2022-02-01 DIAGNOSIS — S61411A Laceration without foreign body of right hand, initial encounter: Secondary | ICD-10-CM | POA: Diagnosis not present

## 2022-02-01 DIAGNOSIS — I5032 Chronic diastolic (congestive) heart failure: Secondary | ICD-10-CM | POA: Diagnosis not present

## 2022-02-01 DIAGNOSIS — R339 Retention of urine, unspecified: Secondary | ICD-10-CM | POA: Diagnosis not present

## 2022-02-01 DIAGNOSIS — W19XXXA Unspecified fall, initial encounter: Secondary | ICD-10-CM | POA: Diagnosis not present

## 2022-02-01 DIAGNOSIS — I10 Essential (primary) hypertension: Secondary | ICD-10-CM | POA: Diagnosis not present

## 2022-02-01 DIAGNOSIS — M79641 Pain in right hand: Secondary | ICD-10-CM | POA: Diagnosis not present

## 2022-02-01 DIAGNOSIS — J9601 Acute respiratory failure with hypoxia: Secondary | ICD-10-CM | POA: Diagnosis not present

## 2022-02-01 DIAGNOSIS — S42211D Unspecified displaced fracture of surgical neck of right humerus, subsequent encounter for fracture with routine healing: Secondary | ICD-10-CM | POA: Diagnosis not present

## 2022-02-01 DIAGNOSIS — J449 Chronic obstructive pulmonary disease, unspecified: Secondary | ICD-10-CM | POA: Diagnosis not present

## 2022-02-01 DIAGNOSIS — Z978 Presence of other specified devices: Secondary | ICD-10-CM | POA: Diagnosis not present

## 2022-02-01 NOTE — Patient Outreach (Signed)
[place] Coordinator follow up. [name] resides in [facility]. He is slated to discharge from [facility] on Saturday, September 16th. SNF social worker report Mr. Rodden will have [facility] home health.  Telephone call made to Ronnie Snyder (daughter in law) per son/DPR Ronnie Snyder's request. Ronnie Snyder reports they are looking into finding private caregivers for a few hours a day. Also states they have been discussing medical alert systems. Writer also suggested in home cameras. Ronnie Snyder reports both Mrs. Ronnie Snyder can be somewhat forgetful. Therefore, she states both she and Ronnie Snyder assist. Ronnie Snyder Ronnie Snyder.   Discussed [facility] care coordination/care management services. Discussed writer will plan to make referral once SNF discharge is confirmed. Will list Ronnie Snyder (son/DPR) as primary contact. Especially since Mr. Dilday defers everything to [employer]. Ronnie Snyder states Ronnie Reaper works 7 am to 330 pm with one alternating day off during the week. Ronnie Snyder states they live 5 miles away from Mr. Whilden and wife.   Reminded Ronnie Snyder the importance of PCP follow up appointment post SNF.   Will make referral for [facility] care coordination upon SNF discharge.    Ronnie Rolling, MSN, RN,BSN [place] Acute Care Coordinator [phone] (Direct dial)

## 2022-02-04 ENCOUNTER — Other Ambulatory Visit: Payer: Self-pay | Admitting: *Deleted

## 2022-02-04 ENCOUNTER — Encounter: Payer: Self-pay | Admitting: *Deleted

## 2022-02-04 ENCOUNTER — Telehealth: Payer: Self-pay | Admitting: *Deleted

## 2022-02-04 DIAGNOSIS — Z466 Encounter for fitting and adjustment of urinary device: Secondary | ICD-10-CM | POA: Diagnosis not present

## 2022-02-04 DIAGNOSIS — I2510 Atherosclerotic heart disease of native coronary artery without angina pectoris: Secondary | ICD-10-CM | POA: Diagnosis not present

## 2022-02-04 DIAGNOSIS — Z9181 History of falling: Secondary | ICD-10-CM | POA: Diagnosis not present

## 2022-02-04 DIAGNOSIS — E785 Hyperlipidemia, unspecified: Secondary | ICD-10-CM | POA: Diagnosis not present

## 2022-02-04 DIAGNOSIS — I5032 Chronic diastolic (congestive) heart failure: Secondary | ICD-10-CM | POA: Diagnosis not present

## 2022-02-04 DIAGNOSIS — F17210 Nicotine dependence, cigarettes, uncomplicated: Secondary | ICD-10-CM | POA: Diagnosis not present

## 2022-02-04 DIAGNOSIS — M150 Primary generalized (osteo)arthritis: Secondary | ICD-10-CM | POA: Diagnosis not present

## 2022-02-04 DIAGNOSIS — F3289 Other specified depressive episodes: Secondary | ICD-10-CM | POA: Diagnosis not present

## 2022-02-04 DIAGNOSIS — F411 Generalized anxiety disorder: Secondary | ICD-10-CM | POA: Diagnosis not present

## 2022-02-04 DIAGNOSIS — M80021D Age-related osteoporosis with current pathological fracture, right humerus, subsequent encounter for fracture with routine healing: Secondary | ICD-10-CM | POA: Diagnosis not present

## 2022-02-04 DIAGNOSIS — R339 Retention of urine, unspecified: Secondary | ICD-10-CM | POA: Diagnosis not present

## 2022-02-04 DIAGNOSIS — I5189 Other ill-defined heart diseases: Secondary | ICD-10-CM

## 2022-02-04 DIAGNOSIS — J9601 Acute respiratory failure with hypoxia: Secondary | ICD-10-CM | POA: Diagnosis not present

## 2022-02-04 DIAGNOSIS — Z9981 Dependence on supplemental oxygen: Secondary | ICD-10-CM | POA: Diagnosis not present

## 2022-02-04 DIAGNOSIS — M0579 Rheumatoid arthritis with rheumatoid factor of multiple sites without organ or systems involvement: Secondary | ICD-10-CM | POA: Diagnosis not present

## 2022-02-04 DIAGNOSIS — K219 Gastro-esophageal reflux disease without esophagitis: Secondary | ICD-10-CM | POA: Diagnosis not present

## 2022-02-04 DIAGNOSIS — I714 Abdominal aortic aneurysm, without rupture, unspecified: Secondary | ICD-10-CM | POA: Diagnosis not present

## 2022-02-04 DIAGNOSIS — R1312 Dysphagia, oropharyngeal phase: Secondary | ICD-10-CM | POA: Diagnosis not present

## 2022-02-04 DIAGNOSIS — J432 Centrilobular emphysema: Secondary | ICD-10-CM | POA: Diagnosis not present

## 2022-02-04 DIAGNOSIS — N401 Enlarged prostate with lower urinary tract symptoms: Secondary | ICD-10-CM | POA: Diagnosis not present

## 2022-02-04 DIAGNOSIS — I110 Hypertensive heart disease with heart failure: Secondary | ICD-10-CM | POA: Diagnosis not present

## 2022-02-04 DIAGNOSIS — J9602 Acute respiratory failure with hypercapnia: Secondary | ICD-10-CM | POA: Diagnosis not present

## 2022-02-04 DIAGNOSIS — I739 Peripheral vascular disease, unspecified: Secondary | ICD-10-CM | POA: Diagnosis not present

## 2022-02-04 DIAGNOSIS — R7303 Prediabetes: Secondary | ICD-10-CM | POA: Diagnosis not present

## 2022-02-04 DIAGNOSIS — I350 Nonrheumatic aortic (valve) stenosis: Secondary | ICD-10-CM | POA: Diagnosis not present

## 2022-02-04 DIAGNOSIS — E039 Hypothyroidism, unspecified: Secondary | ICD-10-CM | POA: Diagnosis not present

## 2022-02-04 NOTE — Patient Outreach (Signed)
Care Coordination [facility] Note Transition Care Management Follow-up Telephone Call Date of discharge and from where: Saturday, 02/02/22- [facility] Rehab center How have you been since you were released from the hospital? Per spouse [name], whom patient provided verbal consent for me to speak with, "He is doing overall very good, but he is just not motivated to do much on his own; I think he is tired; the home health nurse just got here and she is working with him now" Any questions or concerns? Yes- wife reports she is unsure who will be completing foley catheter care at home-- encouraged her to discuss with home health nurse while she is there at their home, now; also encouraged prompt follow rehab up office visits with both urology and PCP; spouse confirms that their son works at [employer] and "is also a great help;" facilitated scheduling of prompt Rehab discharge PCP office visit; confirmed that [facility] RN CM PAC Marthenia Rolling placed referral for RN [place] outreach post- rehabilitation discharge  Items Reviewed: Did the pt receive and understand the discharge instructions provided? Yes  Medications obtained and verified? Yes - spouse reports home health nurse verified that she has an updated/ current list of patient's medications post-rehab discharge Other? No  Any new allergies since your discharge? No  Dietary orders reviewed? Yes Do you have support at home? Yes  patient's spouse and his son/ son's spouse are assisting as indicated/ needed  Home Care and Equipment/Supplies: Were home health services ordered? yes If so, what is the name of the agency? [facility]  Has the agency set up a time to come to the patient's home? Yes- home health RN currently at patient's home Were any new equipment or medical supplies ordered?  No What is the name of the medical supply agency? N/A Were you able to get the supplies/equipment? not applicable Do you have any questions related to the use of  the equipment or supplies? No  Functional Questionnaire: (I = Independent and D = Dependent) ADLs: I  spouse and his son/ son's spouse are assisting as indicated/ needed  Bathing/Dressing- D spouse and his son/ son's spouse are assisting as indicated/ needed   Meal Prep- D  spouse and his son/ son's spouse are assisting as indicated/ needed  Eating- I  Maintaining continence- I  spouse and his son/ son's spouse are assisting as indicated/ needed with foley catheter management  Transferring/Ambulation- D spouse and his son/ son's spouse are assisting as indicated/ needed: patient has and is using walker and wheelchair  Managing Meds- D  spouse and his son/ son's spouse are assisting as indicated/ needed  Follow up appointments reviewed:  PCP Hospital f/u appt confirmed? No  Scheduled to see - on - @ - facilitated scheduling of prompt PCP office visit post-rehabilitation discharge [facility] f/u appt confirmed? No  Scheduled to see - on - @ - Are transportation arrangements needed? No  If their condition worsens, is the pt aware to call PCP or go to the Emergency Dept.? Yes Was the patient provided with contact information for the PCP's office or ED? Yes Was to pt encouraged to call back with questions or concerns? Yes  SDOH assessments and interventions completed:   Yes  Care Coordination Interventions Activated:  Yes   Care Coordination Interventions:  Facilitated scheduling of prompt PCP office visit post-rehabilitation discharge; discussed home health services/ use of skilled home care providers in ensuring foley catheter care at home and communicating needs at home  to PCP or other care providers; provided education around basics of foley catheter care at home; confirmed [facility] RN CM PAC has placed referral for ongoing care coordination follow up with RN CM Care Coordinator    Encounter Outcome:  Pt. Visit Completed    Oneta Rack, RN, BSN, CCRN Alumnus RN CM Care  Coordination/ Transition of [place] Management [phone]: direct office

## 2022-02-04 NOTE — Chronic Care Management (AMB) (Signed)
  Care Coordination   Note   02/04/2022 Name: ELIA KEENUM MRN: [id number] DOB: [birth year]  Caprice Renshaw Bertha is a 81 y.o. year old male who sees Luetta Nutting, DO for primary care. I reached out to Cecil Cranker by phone today to offer care coordination services.  Mr. Geil was given information about Care Coordination services today including:   The Care Coordination services include support from the care team which includes your Nurse Coordinator, Clinical Social Worker, or Pharmacist.  The Care Coordination team is here to help remove barriers to the health concerns and goals most important to you. Care Coordination services are voluntary, and the patient may decline or stop services at any time by request to their care team member.   Care Coordination Consent Status: Patient did not agree to participate in care coordination services at this time.  Follow up plan:  per pt son [name], pt has already had appt with Education officer, museum and has home health set up to come 2 times weekly, pt appreciative and asked to be transferred call to Dr Zigmund Daniel office to schedule a follow up appt with Dr Zigmund Daniel as requested   Encounter Outcome:  Pt. Refused  Julian Hy, [place] Direct Dial: [phone]

## 2022-02-04 NOTE — Patient Outreach (Addendum)
[facility]N Post- Acute Care Coordinator follow.   Verified in [facility] Mr. Zanetti discharged from [facility] SN[facility] on 02/02/22. He will have [facility]. Spoke with both son [name] and daughter in law Suanne Marker about [facility] care coordination services last week.   Also followed by [facility] palliative.  Will make referral to [facility] care coordination team.   Mr. Strahm has medical history of CHF, AAA, HTN, HLD, CVA, PAD, GERD.    Marthenia Rolling, MSN, RN,BSN [facility] Acute Care Coordinator [phone] (Direct dial)

## 2022-02-05 DIAGNOSIS — Z466 Encounter for fitting and adjustment of urinary device: Secondary | ICD-10-CM | POA: Diagnosis not present

## 2022-02-05 DIAGNOSIS — J9602 Acute respiratory failure with hypercapnia: Secondary | ICD-10-CM | POA: Diagnosis not present

## 2022-02-05 DIAGNOSIS — J9601 Acute respiratory failure with hypoxia: Secondary | ICD-10-CM | POA: Diagnosis not present

## 2022-02-05 DIAGNOSIS — M80021D Age-related osteoporosis with current pathological fracture, right humerus, subsequent encounter for fracture with routine healing: Secondary | ICD-10-CM | POA: Diagnosis not present

## 2022-02-05 DIAGNOSIS — R339 Retention of urine, unspecified: Secondary | ICD-10-CM | POA: Diagnosis not present

## 2022-02-05 DIAGNOSIS — J432 Centrilobular emphysema: Secondary | ICD-10-CM | POA: Diagnosis not present

## 2022-02-06 ENCOUNTER — Telehealth: Payer: Self-pay

## 2022-02-06 NOTE — Telephone Encounter (Signed)
(  4:24 pm) PC SW scheduled initial palliative care visit with patient. Patient scheduled with RN/SW team for 02/19/22 @ 9:30 am.

## 2022-02-12 ENCOUNTER — Telehealth: Payer: Self-pay

## 2022-02-12 NOTE — Telephone Encounter (Signed)
  HEART AND VASCULAR CENTER   MULTIDISCIPLINARY HEART VALVE TEAM  I contacted the pt's son, [name], to discuss how the pt is progressing.  Per [name] the pt has returned home.  The pt is scheduled to see his PCP this Thursday and Urology next Tuesday (still has foley catheter). I discussed with [name] that the pt was referred to the Structural Heart team in July due to findings of severe aortic stenosis and to determine if the pt is a candidate for TAVR (Transcatheter Aortic Valve Replacement).  The pt would need to see the cardiologist and cardiac surgeon for evaluation and under go a cardiac CT, CTA chest aorta/abdomen and pelvis as part of the work up. Scott would like to hold off on arranging evaluation with the structural team. He would like the pt's PCP to weigh in and see if it is appropriate for the patient to undergo the evaluation and possible heart surgery.

## 2022-02-13 DIAGNOSIS — R339 Retention of urine, unspecified: Secondary | ICD-10-CM | POA: Diagnosis not present

## 2022-02-13 DIAGNOSIS — J432 Centrilobular emphysema: Secondary | ICD-10-CM | POA: Diagnosis not present

## 2022-02-13 DIAGNOSIS — M80021D Age-related osteoporosis with current pathological fracture, right humerus, subsequent encounter for fracture with routine healing: Secondary | ICD-10-CM | POA: Diagnosis not present

## 2022-02-13 DIAGNOSIS — Z466 Encounter for fitting and adjustment of urinary device: Secondary | ICD-10-CM | POA: Diagnosis not present

## 2022-02-13 DIAGNOSIS — J9601 Acute respiratory failure with hypoxia: Secondary | ICD-10-CM | POA: Diagnosis not present

## 2022-02-13 DIAGNOSIS — J9602 Acute respiratory failure with hypercapnia: Secondary | ICD-10-CM | POA: Diagnosis not present

## 2022-02-14 ENCOUNTER — Encounter: Payer: Self-pay | Admitting: Family Medicine

## 2022-02-14 ENCOUNTER — Ambulatory Visit (INDEPENDENT_AMBULATORY_CARE_PROVIDER_SITE_OTHER): Payer: Medicare Other | Admitting: Family Medicine

## 2022-02-14 VITALS — BP 177/73 | HR 66 | Ht 67.0 in | Wt 110.0 lb

## 2022-02-14 DIAGNOSIS — I10 Essential (primary) hypertension: Secondary | ICD-10-CM | POA: Diagnosis not present

## 2022-02-14 DIAGNOSIS — R634 Abnormal weight loss: Secondary | ICD-10-CM | POA: Diagnosis not present

## 2022-02-14 DIAGNOSIS — R3911 Hesitancy of micturition: Secondary | ICD-10-CM

## 2022-02-14 DIAGNOSIS — J9611 Chronic respiratory failure with hypoxia: Secondary | ICD-10-CM | POA: Diagnosis not present

## 2022-02-14 DIAGNOSIS — I350 Nonrheumatic aortic (valve) stenosis: Secondary | ICD-10-CM

## 2022-02-14 DIAGNOSIS — D500 Iron deficiency anemia secondary to blood loss (chronic): Secondary | ICD-10-CM

## 2022-02-14 DIAGNOSIS — E440 Moderate protein-calorie malnutrition: Secondary | ICD-10-CM | POA: Diagnosis not present

## 2022-02-14 DIAGNOSIS — R5383 Other fatigue: Secondary | ICD-10-CM

## 2022-02-15 ENCOUNTER — Emergency Department (HOSPITAL_COMMUNITY)
Admission: EM | Admit: 2022-02-15 | Discharge: 2022-02-16 | Disposition: A | Discharge status: Home / Self Care | Payer: Medicare Other | Attending: Emergency Medicine | Admitting: Emergency Medicine

## 2022-02-15 ENCOUNTER — Other Ambulatory Visit: Payer: Self-pay

## 2022-02-15 DIAGNOSIS — R319 Hematuria, unspecified: Secondary | ICD-10-CM | POA: Diagnosis present

## 2022-02-15 DIAGNOSIS — J449 Chronic obstructive pulmonary disease, unspecified: Secondary | ICD-10-CM | POA: Insufficient documentation

## 2022-02-15 DIAGNOSIS — Z466 Encounter for fitting and adjustment of urinary device: Secondary | ICD-10-CM | POA: Diagnosis not present

## 2022-02-15 DIAGNOSIS — N3001 Acute cystitis with hematuria: Secondary | ICD-10-CM | POA: Insufficient documentation

## 2022-02-15 DIAGNOSIS — E039 Hypothyroidism, unspecified: Secondary | ICD-10-CM | POA: Diagnosis not present

## 2022-02-15 DIAGNOSIS — Z79899 Other long term (current) drug therapy: Secondary | ICD-10-CM | POA: Diagnosis not present

## 2022-02-15 DIAGNOSIS — I110 Hypertensive heart disease with heart failure: Secondary | ICD-10-CM | POA: Diagnosis not present

## 2022-02-15 DIAGNOSIS — R339 Retention of urine, unspecified: Secondary | ICD-10-CM | POA: Diagnosis not present

## 2022-02-15 DIAGNOSIS — I5020 Unspecified systolic (congestive) heart failure: Secondary | ICD-10-CM | POA: Diagnosis not present

## 2022-02-15 DIAGNOSIS — D72829 Elevated white blood cell count, unspecified: Secondary | ICD-10-CM | POA: Insufficient documentation

## 2022-02-15 DIAGNOSIS — J9601 Acute respiratory failure with hypoxia: Secondary | ICD-10-CM | POA: Diagnosis not present

## 2022-02-15 DIAGNOSIS — I10 Essential (primary) hypertension: Secondary | ICD-10-CM

## 2022-02-15 DIAGNOSIS — R69 Illness, unspecified: Secondary | ICD-10-CM | POA: Diagnosis not present

## 2022-02-15 DIAGNOSIS — J432 Centrilobular emphysema: Secondary | ICD-10-CM | POA: Diagnosis not present

## 2022-02-15 DIAGNOSIS — Z7982 Long term (current) use of aspirin: Secondary | ICD-10-CM | POA: Insufficient documentation

## 2022-02-15 DIAGNOSIS — J9602 Acute respiratory failure with hypercapnia: Secondary | ICD-10-CM | POA: Diagnosis not present

## 2022-02-15 DIAGNOSIS — M80021D Age-related osteoporosis with current pathological fracture, right humerus, subsequent encounter for fracture with routine healing: Secondary | ICD-10-CM | POA: Diagnosis not present

## 2022-02-15 LAB — URINALYSIS, ROUTINE W REFLEX MICROSCOPIC
Bilirubin Urine: NEGATIVE
Glucose, UA: NEGATIVE mg/dL
Ketones, ur: NEGATIVE mg/dL
Nitrite: NEGATIVE
Protein, ur: NEGATIVE mg/dL
Specific Gravity, Urine: 1.003 — ABNORMAL LOW (ref 1.005–1.030)
pH: 8 (ref 5.0–8.0)

## 2022-02-15 LAB — CBC WITH DIFFERENTIAL/PLATELET
Abs Immature Granulocytes: 0.08 10*3/uL — ABNORMAL HIGH (ref 0.00–0.07)
Absolute Monocytes: 733 cells/uL (ref 200–950)
Basophils Absolute: 37 cells/uL (ref 0–200)
Basophils Absolute: 0.1 10*3/uL (ref 0.0–0.1)
Basophils Relative: 0.5 %
Basophils Relative: 1 %
Eosinophils Absolute: 59 cells/uL (ref 15–500)
Eosinophils Absolute: 0.1 10*3/uL (ref 0.0–0.5)
Eosinophils Relative: 0.8 %
Eosinophils Relative: 1 %
HCT: 31.8 % — ABNORMAL LOW (ref 38.5–50.0)
HCT: 29.8 % — ABNORMAL LOW (ref 39.0–52.0)
Hemoglobin: 10.9 g/dL — ABNORMAL LOW (ref 13.2–17.1)
Hemoglobin: 9.8 g/dL — ABNORMAL LOW (ref 13.0–17.0)
Immature Granulocytes: 1 %
Lymphocytes Relative: 10 %
Lymphs Abs: 636 cells/uL — ABNORMAL LOW (ref 850–3900)
Lymphs Abs: 0.9 10*3/uL (ref 0.7–4.0)
MCH: 32.4 pg (ref 27.0–33.0)
MCH: 33.2 pg (ref 26.0–34.0)
MCHC: 34.3 g/dL (ref 32.0–36.0)
MCHC: 32.9 g/dL (ref 30.0–36.0)
MCV: 94.6 fL (ref 80.0–100.0)
MCV: 101 fL — ABNORMAL HIGH (ref 80.0–100.0)
MPV: 11.6 fL (ref 7.5–12.5)
Monocytes Absolute: 0.8 10*3/uL (ref 0.1–1.0)
Monocytes Relative: 9.9 %
Monocytes Relative: 9 %
Neutro Abs: 5935 cells/uL (ref 1500–7800)
Neutro Abs: 6.6 10*3/uL (ref 1.7–7.7)
Neutrophils Relative %: 80.2 %
Neutrophils Relative %: 78 %
Platelets: 234 10*3/uL (ref 140–400)
Platelets: 191 10*3/uL (ref 150–400)
RBC: 3.36 10*6/uL — ABNORMAL LOW (ref 4.20–5.80)
RBC: 2.95 MIL/uL — ABNORMAL LOW (ref 4.22–5.81)
RDW: 13.2 % (ref 11.0–15.0)
RDW: 14.3 % (ref 11.5–15.5)
Total Lymphocyte: 8.6 %
WBC: 7.4 10*3/uL (ref 3.8–10.8)
WBC: 8.5 10*3/uL (ref 4.0–10.5)
nRBC: 0 % (ref 0.0–0.2)

## 2022-02-15 LAB — BASIC METABOLIC PANEL
Anion gap: 7 (ref 5–15)
BUN/Creatinine Ratio: 25 (calc) — ABNORMAL HIGH (ref 6–22)
BUN: 9 mg/dL (ref 7–25)
BUN: 10 mg/dL (ref 8–23)
CO2: 37 mmol/L — ABNORMAL HIGH (ref 20–32)
CO2: 32 mmol/L (ref 22–32)
Calcium: 9.4 mg/dL (ref 8.6–10.3)
Calcium: 8.8 mg/dL — ABNORMAL LOW (ref 8.9–10.3)
Chloride: 86 mmol/L — ABNORMAL LOW (ref 98–110)
Chloride: 91 mmol/L — ABNORMAL LOW (ref 98–111)
Creat: 0.36 mg/dL — ABNORMAL LOW (ref 0.70–1.22)
Creatinine, Ser: 0.31 mg/dL — ABNORMAL LOW (ref 0.61–1.24)
GFR, Estimated: >60 mL/min (ref >60)
Glucose, Bld: 91 mg/dL (ref 65–139)
Glucose, Bld: 103 mg/dL — ABNORMAL HIGH (ref 70–99)
Potassium: 4.6 mmol/L (ref 3.5–5.3)
Potassium: 3.9 mmol/L (ref 3.5–5.1)
Sodium: 129 mmol/L — ABNORMAL LOW (ref 135–146)
Sodium: 130 mmol/L — ABNORMAL LOW (ref 135–145)

## 2022-02-15 LAB — IRON,TIBC AND FERRITIN PANEL
%SAT: 12 % (calc) — ABNORMAL LOW (ref 20–48)
Ferritin: 348 ng/mL (ref 24–380)
Iron: 33 ug/dL — ABNORMAL LOW (ref 50–180)
TIBC: 275 mcg/dL (calc) (ref 250–425)

## 2022-02-15 LAB — TSH: TSH: 2.48 mIU/L (ref 0.40–4.50)

## 2022-02-15 MED ORDER — CEFDINIR 300 MG PO CAPS
300.0000 mg | ORAL_CAPSULE | Freq: Two times a day (BID) | ORAL | Status: DC
  Administered 2022-02-16: 300 mg via ORAL
  Filled 2022-02-15 (×2): qty 1

## 2022-02-15 MED ORDER — HYDROCHLOROTHIAZIDE 25 MG PO TABS: 25.0000 mg | ORAL_TABLET | Freq: Every day | ORAL | 0 refills | Status: DC

## 2022-02-15 MED ORDER — HYDROCHLOROTHIAZIDE 25 MG PO TABS
25.0000 mg | ORAL_TABLET | Freq: Every day | ORAL | Status: DC
  Administered 2022-02-15: 25 mg via ORAL
  Filled 2022-02-15: qty 1

## 2022-02-15 MED ORDER — LOSARTAN POTASSIUM 50 MG PO TABS
100.0000 mg | ORAL_TABLET | Freq: Every day | ORAL | Status: DC
  Administered 2022-02-15: 100 mg via ORAL
  Filled 2022-02-15: qty 2

## 2022-02-15 MED ORDER — LOSARTAN POTASSIUM 100 MG PO TABS: 100.0000 mg | ORAL_TABLET | Freq: Every day | ORAL | 0 refills | Status: DC

## 2022-02-15 MED ORDER — CEFPODOXIME PROXETIL 200 MG PO TABS: 200.0000 mg | ORAL_TABLET | Freq: Two times a day (BID) | ORAL | 0 refills | Status: AC

## 2022-02-15 NOTE — Discharge Instructions (Addendum)
Thank you for coming to [facility] Emergency Department. You were seen for bleeding in your foley bag. We did an exam, labs, and imaging, and these showed an infection in your urine. We exchanged your foley and will treat with cefpodoxime 200 mg every day for 10 days.  You also have blood pressure that was high here in the emergency department. After discussing with pharmacy we decided to prescribe you hydrochlorothiazide (HCTZ) which you have taken before. Please take 25 mg every day and follow up with your primary care physician about your blood pressure regimen as well as your chronically low sodium levels. Please follow up with your primary care provider within 1 week.   Do not hesitate to return to the ED or call 911 if you experience: -Worsening symptoms -Lightheadedness, passing out -Fevers/chills -Anything else that concerns you

## 2022-02-15 NOTE — ED Triage Notes (Signed)
Pt arrived to ED via [facility] EMS. Pt's home health nurse called due to hematuria. They reported a fall due to pneumonia. EMS reports cloudy urine X 2 days. Patient states he has no pain. Pt not on blood thinner. Pt takes iron pill. Pt has a history of COPD and CHF. Pt is always on 3 L O2. EMS reports BP 150/80. O2 90% on 3 L. Patient has a temp cath, but does not know the last time it was changed. Patient alert and oriented X4.

## 2022-02-15 NOTE — ED Notes (Signed)
Son Kendarius Vigen [phone] would like an update asap

## 2022-02-15 NOTE — ED Notes (Signed)
Called [facility] to transport patient to home address.

## 2022-02-15 NOTE — ED Provider Notes (Signed)
[facility] EMERGENCY DEPARTMENT Provider Note   CSN: [id number] Arrival date & time: 02/15/22  1739     History  Chief Complaint  Patient presents with   Hematuria    Ronnie Snyder is a 81 y.o. male with COPD and continued tobacco use on 3 L O2 at baseline, diastolic CHF , HLD, HTN, hypothyroidism, carotid artery stenosis, AAA, homocystinemia, GERD complicated by Barrett's esophagus, history L4 compression fracture, anemia, PAD, history of cerebral thrombosis with infarct, history of GI bleed secondary to AVM, hepatic steatosis who is brought in by [facility] with hematuria.   Home health nurse called 911 due to hematuria.  EMS stated that has had cloudy urine for 2 days.  Patient denies any pain, does not take any anticoagulation.  Patient does have a urinary catheter after a hospitalization for humeral fx and states he thinks it was changed approx 1 month ago. Denies any CP, SOB, abd pain, pain around penis/catheter, hematochezia/melena. 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 Denies f/c. Lives with wife at home.   Per chart review, patient was recently admitted from 11/25/2021 to 12/03/2021 for hypoxic/hypercapnic respite failure due to CAP/COPD exacerbation.  Fell at that time with displaced right humeral neck fracture.  Was discharged with oxygen 3 L.   Hematuria       Home Medications Prior to Admission medications   Medication Sig Start Date End Date Taking? Authorizing Provider  cefpodoxime (VANTIN) 200 MG tablet Take 1 tablet (200 mg total) by mouth 2 (two) times daily for 10 days. 02/15/22 02/25/22 Yes Audley Hose, MD  hydrochlorothiazide (HYDRODIURIL) 25 MG tablet Take 1 tablet (25 mg total) by mouth daily. 02/15/22 03/17/22 Yes Audley Hose, MD  acetaminophen (TYLENOL) 500 MG tablet Take 1,000 mg by mouth 2 (two) times daily.    [provider]  aspirin EC 81 MG tablet Take 1 tablet  (81 mg total) by mouth daily. Swallow whole. 12/04/21   Hosie Poisson, MD  bethanechol (URECHOLINE) 5 MG tablet Take 1 tablet (5 mg total) by mouth 3 (three) times daily. 12/03/21   Hosie Poisson, MD  Cholecalciferol (VITAMIN D3 GUMMIES PO) Take 1 tablet by mouth every morning.    [provider]  docusate sodium (COLACE) 100 MG capsule Take 1 capsule (100 mg total) by mouth 2 (two) times daily as needed for mild constipation. 12/03/21   Hosie Poisson, MD  FEROSUL 325 (65 Fe) MG tablet TAKE 1 TABLET(325 MG) BY MOUTH DAILY WITH BREAKFAST Patient taking differently: Take 325 mg by mouth daily with breakfast. 07/31/21   Luetta Nutting, DO  folic acid (FOLVITE) 1 MG tablet Take 1 mg by mouth every morning.    [provider]  ipratropium-albuterol (DUONEB) 0.5-2.5 (3) MG/3ML SOLN Take 3 mLs by nebulization every 6 (six) hours as needed. 12/03/21   Hosie Poisson, MD  levothyroxine (SYNTHROID) 75 MCG tablet TAKE 1 TABLET(75 MCG) BY MOUTH DAILY BEFORE AND BREAKFAST Patient taking differently: Take 75 mcg by mouth daily before breakfast. 09/05/21   Luetta Nutting, DO  methotrexate (RHEUMATREX) 2.5 MG tablet Take 20 mg by mouth every Monday. Caution:Chemotherapy. Protect from light.    [provider]  mineral oil liquid Take 5 mLs by mouth every evening.    [provider]  pantoprazole (PROTONIX) 40 MG tablet TAKE 1 TABLET(40 MG) BY MOUTH DAILY. NEED OFFICE VISIT FOR REFILLS. Patient taking differently: Take 40 mg by mouth daily.  08/16/21   Luetta Nutting, DO  polyethylene glycol (MIRALAX / GLYCOLAX) 17 g packet Take 17 g by mouth daily as needed for moderate constipation. 12/03/21   Hosie Poisson, MD  rosuvastatin (CRESTOR) 20 MG tablet Take 1 tablet (20 mg total) by mouth daily. 12/04/21   Hosie Poisson, MD  tamsulosin (FLOMAX) 0.4 MG CAPS capsule TAKE 1 CAPSULE(0.4 MG) BY MOUTH DAILY Patient taking differently: Take 0.4 mg by mouth in the morning. 08/09/21   Luetta Nutting, DO   traZODone (DESYREL) 50 MG tablet Take 25 mg by mouth at bedtime. 02/02/22   [provider]      Allergies    Hydrocodone-acetaminophen, Atorvastatin, Other, Penicillins, and Pioglitazone    Review of Systems   Review of Systems  Genitourinary:  Positive for hematuria.   Review of systems positive/negative for fevers/chills.  A 10 point review of systems was performed and is negative unless otherwise reported in HPI.  Physical Exam Updated Vital Signs BP (!) 163/68   Pulse (!) 58   Temp 98.2 F (36.8 C) (Oral)   Resp 16   Ht '5\' 7"'$  (1.702 m)   Wt 49.9 kg   SpO2 100%   BMI 17.23 kg/m  Physical Exam General: Elderly male lying in bed.  HEENT: Sclera anicteric, MMM, trachea midline. Cardiology: RRR, no murmurs/rubs/gallops. BL radial and DP pulses equal bilaterally.  Resp: Normal respiratory rate and effort. CTAB, no wheezes, rhonchi, crackles.  Abd: Soft, non-tender, non-distended. No rebound tenderness or guarding.  GU: Foley in place with no surrounding erythema, irritation of skin. No bleeding from urethral meatus. MSK: No peripheral edema or signs of trauma. No cyanosis or clubbing. Skin: warm, dry. No rashes or lesions. Back: No CVA tenderness Neuro: A&Ox4, CNs II-XII grossly intact. MAEs. Sensation grossly intact.  Psych: Irritable mood and affect.   ED Results / Procedures / Treatments   Labs (all labs ordered are listed, but only abnormal results are displayed) Labs Reviewed  CBC WITH DIFFERENTIAL/PLATELET - Abnormal; Notable for the following components:      Result Value   RBC 2.95 (*)    Hemoglobin 9.8 (*)    HCT 29.8 (*)    MCV 101.0 (*)    Abs Immature Granulocytes 0.08 (*)    All other components within normal limits  BASIC METABOLIC PANEL - Abnormal; Notable for the following components:   Sodium 130 (*)    Chloride 91 (*)    Glucose, Bld 103 (*)    Creatinine, Ser 0.31 (*)    Calcium 8.8 (*)    All other components within normal limits   URINALYSIS, ROUTINE W REFLEX MICROSCOPIC - Abnormal; Notable for the following components:   Specific Gravity, Urine 1.003 (*)    Hgb urine dipstick LARGE (*)    Leukocytes,Ua SMALL (*)    Bacteria, UA RARE (*)    All other components within normal limits  URINE CULTURE    Procedures Procedures    Medications Ordered in ED Medications  hydrochlorothiazide (HYDRODIURIL) tablet 25 mg (25 mg Oral Given 02/15/22 2319)  cefdinir (OMNICEF) capsule 300 mg (has no administration in time range)    ED Course/ Medical Decision Making/ A&P                          Medical Decision Making Amount and/or Complexity of Data Reviewed Labs: ordered. Decision-making details documented in ED Course.  Risk Prescription drug management.   For painless hematuria, consider UTI/cystitis,  irritation from catheter. No trauma from catheter reported. Consider urologic cancer as well with elderly gentleman with painless hematuria.  No flank pain to indicate nephrolithiasis. Patient is nontoxic appearing and afebrile, no tachycardia, very low c/f sepsis/bacteremia. He his hypertensive, consider hypertensive nephropathy and will obtain BMP/electrolytes. Stable on his home 3L [place]. Patient has no CP/headache/visual changes to otherwise indicate hypertensive emergency. Consider asymptomatic HTN.   I have personally reviewed and interpreted all labs and imaging.   Clinical Course as of 02/15/22 2352  Fri Feb 15, 2022  2124 Hemoglobin(!): 9.8 C/w baseline values [HN]  2124 WBC: 8.5 No leukocytosis [HN]  2124 Chalmers Guest): SMALL [HN]  2124 WBC, UA: 21-50 [HN]  2124 Bacteria, UA(!): RARE E/o UTI [HN]  2124 Urine culture drawn [HN]  2125 Sodium(!): 130 Prior hyponatremia 129-132 since July [HN]  2312 D/w pharmacy about patient's HTN. Had previously been taking losartan and HCTZ, I also see lisinopril-HCTZ in the chart as well. Patient looks like he filled losartan as recently as June but not since then.  Will give losartan and HCTZ here now and prescribe both at the doses he was taking before. [HN]  2321 For UTI, patient has no prior urine cultures for comparison. Will treat with cefpodoxime 200 mg BID x 10 days. Recommending f/u with PCP within 1 week.  [HN]  2327 Patient's BP now 160s. Will just start treatment with HCTZ and have him f/u.  [HN]    Clinical Course User Index [HN] Audley Hose, MD    Patient hypertensive during visit to [id number] systolic. Per chart review, had previously been taking lisinopril-HCTZ, but switched to losartan-HCTZ due to cough, and he does not take that anymore. After discussion w/ pharmacy will prescribe cefpodoxime for UTI as well as HCTZ 25 mg daily for hypertension.  Patient's Foley was exchanged during the visit today.  Explained to patient his findings and patient reports understanding.  He is irritated that we have kept in here for so long until late at night.  Explained to patient that he needs to follow-up with his primary care physician within 1 week for blood pressure management and follow-up about his UTI and chronic hyponatremia.  All questions answered to be satisfaction.  Will be discharged in stable condition with discharge instructions and return precautions.    Dispo: DC         Final Clinical Impression(s) / ED Diagnoses Final diagnoses:  Acute cystitis with hematuria  Asymptomatic hypertension    Rx / DC Orders ED Discharge Orders          Ordered    cefpodoxime (VANTIN) 200 MG tablet  2 times daily        02/15/22 2326    losartan (COZAAR) 100 MG tablet  Daily,   Status:  Discontinued        02/15/22 2326    hydrochlorothiazide (HYDRODIURIL) 25 MG tablet  Daily        02/15/22 2326             This note was created using dictation software, which may contain spelling or grammatical errors.    Audley Hose, MD 02/15/22 [phone]

## 2022-02-16 DIAGNOSIS — Z7401 Bed confinement status: Secondary | ICD-10-CM | POA: Diagnosis not present

## 2022-02-16 DIAGNOSIS — N3001 Acute cystitis with hematuria: Secondary | ICD-10-CM | POA: Diagnosis not present

## 2022-02-16 DIAGNOSIS — R319 Hematuria, unspecified: Secondary | ICD-10-CM | POA: Diagnosis not present

## 2022-02-16 DIAGNOSIS — I10 Essential (primary) hypertension: Secondary | ICD-10-CM | POA: Diagnosis not present

## 2022-02-16 NOTE — ED Notes (Signed)
Talked with the patients wife, she called in with concerns about husbands discharged. States that she has never heard of a person being discharged at [facility]. Let patient's wife know that the emergency department is a 24 hour department and patients are discharged at all hours. Also let patient know that patient should be arriving with a discharge summary that details his visit here as she asked if a doctor was the one that actually discharged him. Patients wife stated understanding and took the complaint number if she had further concerns.

## 2022-02-16 NOTE — ED Notes (Signed)
This RN notified pt that he is placed for discharge. Pt states "my nurse told me I could stay". This RN informed pt that person he was referring to was his doctor and that she has placed him up for discharge. This RN inquired about how pt's transportation home. Pt states that he wife does not drive and that his son is the only one to pick him up but he will not answer the phone because he works in the morning. This RN notified pt that medical transport will take him home and verified correct address with pt. Pt confirmed address and PTAR contacted via ED secretary.

## 2022-02-16 NOTE — ED Notes (Addendum)
Pt discharged home. PTAR at bedside to transport patient home. Pt initially refused transport stating "my nurse told me I could stay here". This RN reminded pt that she has been primary nurse assigned to pt since 1900 and that person he is referring to as his "nurse" was in fact his doctor, who is deciding physician that placed him up for discharge. This RN attempted to review discharge instructions with pt, however pt uncooperative and augmentative. Pt also requested phone to make phone call, this RN provided phone to patient. Pt states "this phone does not work", this Therapist, sports showed pt how to dial out and dialed the number pt requested, and pt handed the phone. Pt then spoke with someone on receiving line and threw the phone across the room. This RN informed pt that behavior is not desired nor allowed in this facility. Pt states "I did not throw the phone, it fell". This RN left room and called wife at nurses station to confirm availability for [facility] transport. Upon returning to room to help pt get dressed for discharge, pt states that there is blood in his urine and he cannot go home. Dr. Ralene Bathe, called to bedside and assessed pt's urine and cleared pt for discharge. This RN once again attempted to help pt get dressed. Pt verbally aggressive with this RN. Second RN called to bedside to help pt get dressed for [facility] transport. Pt notified by this RN that his wife was contacted. Pt transported home with PTAR. No distress noted  at time of departure from this department.

## 2022-02-17 DIAGNOSIS — E46 Unspecified protein-calorie malnutrition: Secondary | ICD-10-CM | POA: Insufficient documentation

## 2022-02-17 NOTE — Assessment & Plan Note (Signed)
Recommend following up with structural heart team to discuss TAVR.

## 2022-02-17 NOTE — Assessment & Plan Note (Signed)
Significant weight loss over the past year.  Recommend increasing protein intake.

## 2022-02-17 NOTE — Assessment & Plan Note (Signed)
Remains on oxygen.  History of COPD with continued smoking.  Counseling on smoking cessation especially with oxygen use.
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
## 2022-02-17 NOTE — Assessment & Plan Note (Signed)
Having increased retention.  Currently has Foley with follow-up with urology.

## 2022-02-17 NOTE — Assessment & Plan Note (Addendum)
Blood pressure is elevated at this time.  Unsure if he is taking all his medication at this time.  He will check to be sure he has all listed medication at home.

## 2022-02-19 ENCOUNTER — Other Ambulatory Visit: Payer: Medicare Other

## 2022-02-19 ENCOUNTER — Other Ambulatory Visit: Payer: Medicare Other | Admitting: *Deleted

## 2022-02-19 VITALS — BP 122/60 | HR 70 | Temp 98.0°F | Resp 18

## 2022-02-19 DIAGNOSIS — R338 Other retention of urine: Secondary | ICD-10-CM | POA: Diagnosis not present

## 2022-02-19 DIAGNOSIS — Z515 Encounter for palliative care: Secondary | ICD-10-CM

## 2022-02-19 LAB — URINE CULTURE: Culture: 20000 — AB

## 2022-02-19 NOTE — Progress Notes (Signed)
COMMUNITY PALLIATIVE CARE SW NOTE  PATIENT NAME: Ronnie Snyder DOB: 1940-07-03 MRN: [id number]  PRIMARY CARE PROVIDER: Luetta Nutting, DO  RESPONSIBLE PARTY:  Acct ID - Guarantor Home Phone Work Phone Relationship Acct Type  [id number] Normajean Baxter,* [phone]  Self P/F     [address], [place], [place] [address]   SOCIAL WORK VISIT/Homevisit  PC SW and RN-M. Nadara Mustard completed a visit with patient at his home. His son-Scott and his wife was present with him. Patient was in bed, awake and alert. He was wearing o2, via nasal canula. Palliative care team completing a follow-up visit with patient post rehab discharge and recent emergency department. Patient report that he is feeling better since returning home as he is currently on an ABT and drinking more water. He report that he is having increased fatigue, where he is in bed more. However, he does get up for meals and is ambulating independently with his walker. Patient report that his appetite is good. He has gained 9 lbs since returning home from rehab where his current weight is 107 lbs, Patient is currently receiving physical therapy through [facility] 2x/week. The family has hired a private caregivers through [facility] (M, W, & Fri.) for 4 hours. SW provided assessment of needs and comfort of patient and needs and reinforced access to support through palliative care. No psychosocial needs at this time. Patient's son-Scott is PCG as patient's wife has short-term memory issues.  Patient and family remain open to ongoing palliative care support and visits when appropriate.   Duration of visit and documentation: 60 minutes  Katheren Puller, LCSW

## 2022-02-20 ENCOUNTER — Telehealth (HOSPITAL_BASED_OUTPATIENT_CLINIC_OR_DEPARTMENT_OTHER): Payer: Self-pay | Admitting: *Deleted

## 2022-02-20 ENCOUNTER — Telehealth: Payer: Self-pay | Admitting: Physician Assistant

## 2022-02-20 ENCOUNTER — Telehealth: Payer: Self-pay

## 2022-02-20 DIAGNOSIS — J9602 Acute respiratory failure with hypercapnia: Secondary | ICD-10-CM | POA: Diagnosis not present

## 2022-02-20 DIAGNOSIS — R339 Retention of urine, unspecified: Secondary | ICD-10-CM | POA: Diagnosis not present

## 2022-02-20 DIAGNOSIS — J432 Centrilobular emphysema: Secondary | ICD-10-CM | POA: Diagnosis not present

## 2022-02-20 DIAGNOSIS — Z466 Encounter for fitting and adjustment of urinary device: Secondary | ICD-10-CM | POA: Diagnosis not present

## 2022-02-20 DIAGNOSIS — J9601 Acute respiratory failure with hypoxia: Secondary | ICD-10-CM | POA: Diagnosis not present

## 2022-02-20 DIAGNOSIS — M80021D Age-related osteoporosis with current pathological fracture, right humerus, subsequent encounter for fracture with routine healing: Secondary | ICD-10-CM | POA: Diagnosis not present

## 2022-02-20 NOTE — Telephone Encounter (Signed)
Post ED Visit - Positive Culture Follow-up: Unsuccessful Patient Follow-up  Culture assessed and recommendations reviewed by:  '[]'$  Elenor Quinones, Pharm.D. '[x]'$  Heide Guile, Pharm.D., BCPS AQ-ID '[]'$  Parks Neptune, Pharm.D., BCPS '[]'$  Alycia Rossetti, Pharm.D., BCPS '[]'$  Bergholz, Pharm.D., BCPS, AAHIVP '[]'$  Legrand Como, Pharm.D., BCPS, AAHIVP '[]'$  Wynell Balloon, PharmD '[]'$  Vincenza Hews, PharmD, BCPS  Positive urine culture  '[]'$  Patient discharged without antimicrobial prescription and treatment is now indicated '[x]'$  Organism is resistant to prescribed ED discharge antimicrobial '[]'$  Patient with positive blood cultures  Stop Cefpodoxime .  If symptomatic start Macrobid '100mg'$  PO BID x 5 days, Charmaine Downs, [place]  Unable to contact patient after 3 attempts, letter will be sent to address on file  Ardeen Fillers 02/20/2022, 11:21 AM

## 2022-02-20 NOTE — Progress Notes (Addendum)
ED Antimicrobial Stewardship Positive Culture Follow Up   Ronnie Snyder is an 81 y.o. male who presented to [facility] on 02/15/2022 with a chief complaint of hematuria. Chief Complaint  Patient presents with   Hematuria    Recent Results (from the past 720 hour(s))  Urine Culture     Status: Abnormal   Collection Time: 02/15/22  8:15 PM   Specimen: Urine, Clean Catch  Result Value Ref Range Status   Specimen Description URINE, CLEAN CATCH  Final   Special Requests NONE  Final   Culture (A)  Final    20,000 COLONIES/mL ENTEROCOCCUS FAECALIS 50,000 COLONIES/mL CORYNEBACTERIUM STRIATUM Standardized susceptibility testing for this organism is not available. Performed at [facility], [place] [address] [place], [place] [address]    Report Status 02/19/2022 FINAL  Final   Organism ID, Bacteria ENTEROCOCCUS FAECALIS (A)  Final      Susceptibility   Enterococcus faecalis - MIC*    AMPICILLIN <=2 SENSITIVE Sensitive     NITROFURANTOIN <=16 SENSITIVE Sensitive     VANCOMYCIN 1 SENSITIVE Sensitive     * 20,000 COLONIES/mL ENTEROCOCCUS FAECALIS    '[x]'$  Treated with cefpodoxime 200 mg twice daily, organism resistant to prescribed antimicrobial '[]'$  Patient discharged originally without antimicrobial agent and treatment is now indicated  Recommend to stop taking cefpodoxime and perform a symptom check with patient.  If symptoms have improved or resolved, no additional antibiotic treatment is necessary.  If symptoms have not resolved, recommend macrobid 100 mg twice daily for 5 days.  ED Provider: Charmaine Downs, PA-C  Louanne Belton, PharmD, [facility] PGY1 Pharmacy Resident 02/20/2022 10:06 AM

## 2022-02-20 NOTE — Telephone Encounter (Signed)
        Patient  visited [facility] on 9/30    Telephone encounter attempt :  1st  A HIPAA compliant voice message was left requesting a return call.  Instructed patient to call back   [place], [facility]  [phone] [address], [place], [place] [address] Phone: [phone] Email: [email or web address]

## 2022-02-20 NOTE — Telephone Encounter (Signed)
  [facility] VASCULAR CENTER   MULTIDISCIPLINARY HEART VALVE TEAM  Reviewed Dr. Verda Cumins most recent office note who recommended talking to TAVR team. Called son to discuss making an apt. He is out of rehab but receiving palliative care home services. He also has been having issues with hematuria and having to see urology. Son was very overwhelmed with all his different appointments and said he would have to call me back.  Will try reaching back out in a month or so.   Angelena Form PA-C  [facility]

## 2022-02-21 DIAGNOSIS — J9601 Acute respiratory failure with hypoxia: Secondary | ICD-10-CM | POA: Diagnosis not present

## 2022-02-21 DIAGNOSIS — Z466 Encounter for fitting and adjustment of urinary device: Secondary | ICD-10-CM | POA: Diagnosis not present

## 2022-02-21 DIAGNOSIS — R339 Retention of urine, unspecified: Secondary | ICD-10-CM | POA: Diagnosis not present

## 2022-02-21 DIAGNOSIS — J9602 Acute respiratory failure with hypercapnia: Secondary | ICD-10-CM | POA: Diagnosis not present

## 2022-02-21 DIAGNOSIS — M80021D Age-related osteoporosis with current pathological fracture, right humerus, subsequent encounter for fracture with routine healing: Secondary | ICD-10-CM | POA: Diagnosis not present

## 2022-02-21 DIAGNOSIS — J432 Centrilobular emphysema: Secondary | ICD-10-CM | POA: Diagnosis not present

## 2022-02-21 NOTE — Progress Notes (Signed)
[facility] PALLIATIVE CARE RN NOTE  PATIENT NAME: Ronnie Snyder DOB: 09-19-1940 MRN: [id number]  PRIMARY CARE PROVIDER: Luetta Nutting, DO  RESPONSIBLE PARTY: [place] Dec (son) Acct ID - Guarantor Home Phone Work Phone Relationship Acct Type  [id number] Ronnie Snyder,* [phone]  Self P/F     [address] [place], [place] [address]    RN/SW follow-up visit completed with patient, wife and son in the home. Patient had a hospitalization from 11/25/21 to 12/03/21 after having a syncopal episode at home, resulting in a fall and fracture of his right proximal humerus. His son also reports his oxygen level at home when EMS arrived was 31% and he was sob. He was discharged from there to [facility] where he stayed for rehab for about 2 1/2 months. He is now at home and has been there for about 2 weeks. He also recently had a ED visit on 02/15/22 due to acute cystitis with hematuria. Foley catheter was changed while there. He is currently on an antibiotic.   Cognitive: Patient is alert and oriented x 4. Pleasant mood and forgetful  Pain: Denies pain at this time.   Respiratory: O2 dependent at 3L/min. Does have some sob with exertion and takes frequent rest periods. Stays tired most of the time.  Cardiovasular: In ED on 02/15/22 blood pressures where elevated. Patient started on Hydralazine 25 mg daily. BP today 122/60.  Mobility: Ambulatory using a walker. Feels he is slowly getting stronger since he has returned home. Was able to walk into the kitchen to eat breakfast and stand up from his recliner by himself. Had PT evaluation from [facility] and they will begin working with him this week. Limited ROM in right arm due to fracture. Has a fractured right thumb where he fell in the rehab facility the day he was discharged. Son has hired in home caregivers to start soon to assist patient 3 days/week.  Appetite: Improving. He has gained 9 lbs over the past 2 1/2 months.  Current wt 111 lbs.  Skin: Large bruise noted to right posterior forearm. Site of IV when he was in the ED. Thin frail skin that bruises easily.   GI: Continues with foley cathter. Clear yellow urine. Has an appointment with Urology this evening. No hematuria.  Reviewed medications. Patient is no longer taking Aspirin and wife/son says he does not have Ipratropium neb solution and has never used this.    CODE STATUS: Full Code ADVANCED DIRECTIVES: N MOST FORM: no PPS: 50%   PHYSICAL EXAM:   VITALS: Today's Vitals   02/19/22 0955  BP: 122/60  Pulse: 70  Resp: 18  Temp: 98 F (36.7 C)  TempSrc: Temporal  SpO2: 91%  PainSc: 0-No pain     Daryl Eastern, RN BSN

## 2022-02-23 DIAGNOSIS — J9602 Acute respiratory failure with hypercapnia: Secondary | ICD-10-CM | POA: Diagnosis not present

## 2022-02-23 DIAGNOSIS — J9601 Acute respiratory failure with hypoxia: Secondary | ICD-10-CM | POA: Diagnosis not present

## 2022-02-23 DIAGNOSIS — R339 Retention of urine, unspecified: Secondary | ICD-10-CM | POA: Diagnosis not present

## 2022-02-23 DIAGNOSIS — J432 Centrilobular emphysema: Secondary | ICD-10-CM | POA: Diagnosis not present

## 2022-02-23 DIAGNOSIS — Z466 Encounter for fitting and adjustment of urinary device: Secondary | ICD-10-CM | POA: Diagnosis not present

## 2022-02-23 DIAGNOSIS — M80021D Age-related osteoporosis with current pathological fracture, right humerus, subsequent encounter for fracture with routine healing: Secondary | ICD-10-CM | POA: Diagnosis not present

## 2022-02-26 DIAGNOSIS — Z466 Encounter for fitting and adjustment of urinary device: Secondary | ICD-10-CM | POA: Diagnosis not present

## 2022-02-26 DIAGNOSIS — M80021D Age-related osteoporosis with current pathological fracture, right humerus, subsequent encounter for fracture with routine healing: Secondary | ICD-10-CM | POA: Diagnosis not present

## 2022-02-26 DIAGNOSIS — J432 Centrilobular emphysema: Secondary | ICD-10-CM | POA: Diagnosis not present

## 2022-02-26 DIAGNOSIS — J9602 Acute respiratory failure with hypercapnia: Secondary | ICD-10-CM | POA: Diagnosis not present

## 2022-02-26 DIAGNOSIS — J9601 Acute respiratory failure with hypoxia: Secondary | ICD-10-CM | POA: Diagnosis not present

## 2022-02-26 DIAGNOSIS — R339 Retention of urine, unspecified: Secondary | ICD-10-CM | POA: Diagnosis not present

## 2022-02-28 ENCOUNTER — Other Ambulatory Visit: Payer: Self-pay | Admitting: Family Medicine

## 2022-02-28 DIAGNOSIS — K2270 Barrett's esophagus without dysplasia: Secondary | ICD-10-CM

## 2022-02-28 DIAGNOSIS — R339 Retention of urine, unspecified: Secondary | ICD-10-CM | POA: Diagnosis not present

## 2022-02-28 DIAGNOSIS — J432 Centrilobular emphysema: Secondary | ICD-10-CM | POA: Diagnosis not present

## 2022-02-28 DIAGNOSIS — K31819 Angiodysplasia of stomach and duodenum without bleeding: Secondary | ICD-10-CM

## 2022-02-28 DIAGNOSIS — M80021D Age-related osteoporosis with current pathological fracture, right humerus, subsequent encounter for fracture with routine healing: Secondary | ICD-10-CM | POA: Diagnosis not present

## 2022-02-28 DIAGNOSIS — J9601 Acute respiratory failure with hypoxia: Secondary | ICD-10-CM | POA: Diagnosis not present

## 2022-02-28 DIAGNOSIS — J9602 Acute respiratory failure with hypercapnia: Secondary | ICD-10-CM | POA: Diagnosis not present

## 2022-02-28 DIAGNOSIS — Z466 Encounter for fitting and adjustment of urinary device: Secondary | ICD-10-CM | POA: Diagnosis not present

## 2022-03-01 ENCOUNTER — Other Ambulatory Visit: Payer: Self-pay

## 2022-03-01 DIAGNOSIS — J432 Centrilobular emphysema: Secondary | ICD-10-CM | POA: Diagnosis not present

## 2022-03-01 DIAGNOSIS — J9601 Acute respiratory failure with hypoxia: Secondary | ICD-10-CM | POA: Diagnosis not present

## 2022-03-01 DIAGNOSIS — Z466 Encounter for fitting and adjustment of urinary device: Secondary | ICD-10-CM | POA: Diagnosis not present

## 2022-03-01 DIAGNOSIS — M80021D Age-related osteoporosis with current pathological fracture, right humerus, subsequent encounter for fracture with routine healing: Secondary | ICD-10-CM | POA: Diagnosis not present

## 2022-03-01 DIAGNOSIS — R339 Retention of urine, unspecified: Secondary | ICD-10-CM | POA: Diagnosis not present

## 2022-03-01 DIAGNOSIS — J9602 Acute respiratory failure with hypercapnia: Secondary | ICD-10-CM | POA: Diagnosis not present

## 2022-03-01 MED ORDER — TRAZODONE HCL 50 MG PO TABS: 25.0000 mg | ORAL_TABLET | Freq: Every day | ORAL | 0 refills | Status: AC

## 2022-03-01 MED ORDER — HYDROCHLOROTHIAZIDE 25 MG PO TABS: 25.0000 mg | ORAL_TABLET | Freq: Every day | ORAL | 0 refills | Status: AC

## 2022-03-01 MED ORDER — BETHANECHOL CHLORIDE 5 MG PO TABS: 5.0000 mg | ORAL_TABLET | Freq: Three times a day (TID) | ORAL | 0 refills | Status: DC

## 2022-03-03 ENCOUNTER — Other Ambulatory Visit: Payer: Self-pay | Admitting: Family Medicine

## 2022-03-03 MED ORDER — BETHANECHOL CHLORIDE 5 MG PO TABS: 5.0000 mg | ORAL_TABLET | Freq: Three times a day (TID) | ORAL | 0 refills | Status: DC

## 2022-03-04 DIAGNOSIS — Z466 Encounter for fitting and adjustment of urinary device: Secondary | ICD-10-CM | POA: Diagnosis not present

## 2022-03-04 DIAGNOSIS — M80021D Age-related osteoporosis with current pathological fracture, right humerus, subsequent encounter for fracture with routine healing: Secondary | ICD-10-CM | POA: Diagnosis not present

## 2022-03-04 DIAGNOSIS — J9601 Acute respiratory failure with hypoxia: Secondary | ICD-10-CM | POA: Diagnosis not present

## 2022-03-04 DIAGNOSIS — R339 Retention of urine, unspecified: Secondary | ICD-10-CM | POA: Diagnosis not present

## 2022-03-04 DIAGNOSIS — J432 Centrilobular emphysema: Secondary | ICD-10-CM | POA: Diagnosis not present

## 2022-03-04 DIAGNOSIS — J9602 Acute respiratory failure with hypercapnia: Secondary | ICD-10-CM | POA: Diagnosis not present

## 2022-03-05 DIAGNOSIS — R338 Other retention of urine: Secondary | ICD-10-CM | POA: Diagnosis not present

## 2022-03-06 ENCOUNTER — Telehealth: Payer: Self-pay

## 2022-03-06 DIAGNOSIS — M0579 Rheumatoid arthritis with rheumatoid factor of multiple sites without organ or systems involvement: Secondary | ICD-10-CM | POA: Diagnosis not present

## 2022-03-06 DIAGNOSIS — M80021D Age-related osteoporosis with current pathological fracture, right humerus, subsequent encounter for fracture with routine healing: Secondary | ICD-10-CM | POA: Diagnosis not present

## 2022-03-06 DIAGNOSIS — I350 Nonrheumatic aortic (valve) stenosis: Secondary | ICD-10-CM | POA: Diagnosis not present

## 2022-03-06 DIAGNOSIS — I110 Hypertensive heart disease with heart failure: Secondary | ICD-10-CM | POA: Diagnosis not present

## 2022-03-06 DIAGNOSIS — R7303 Prediabetes: Secondary | ICD-10-CM | POA: Diagnosis not present

## 2022-03-06 DIAGNOSIS — Z466 Encounter for fitting and adjustment of urinary device: Secondary | ICD-10-CM | POA: Diagnosis not present

## 2022-03-06 DIAGNOSIS — J9602 Acute respiratory failure with hypercapnia: Secondary | ICD-10-CM | POA: Diagnosis not present

## 2022-03-06 DIAGNOSIS — F3289 Other specified depressive episodes: Secondary | ICD-10-CM | POA: Diagnosis not present

## 2022-03-06 DIAGNOSIS — I5032 Chronic diastolic (congestive) heart failure: Secondary | ICD-10-CM | POA: Diagnosis not present

## 2022-03-06 DIAGNOSIS — I739 Peripheral vascular disease, unspecified: Secondary | ICD-10-CM | POA: Diagnosis not present

## 2022-03-06 DIAGNOSIS — R339 Retention of urine, unspecified: Secondary | ICD-10-CM | POA: Diagnosis not present

## 2022-03-06 DIAGNOSIS — M150 Primary generalized (osteo)arthritis: Secondary | ICD-10-CM | POA: Diagnosis not present

## 2022-03-06 DIAGNOSIS — E039 Hypothyroidism, unspecified: Secondary | ICD-10-CM | POA: Diagnosis not present

## 2022-03-06 DIAGNOSIS — R1312 Dysphagia, oropharyngeal phase: Secondary | ICD-10-CM | POA: Diagnosis not present

## 2022-03-06 DIAGNOSIS — K219 Gastro-esophageal reflux disease without esophagitis: Secondary | ICD-10-CM | POA: Diagnosis not present

## 2022-03-06 DIAGNOSIS — I2510 Atherosclerotic heart disease of native coronary artery without angina pectoris: Secondary | ICD-10-CM | POA: Diagnosis not present

## 2022-03-06 DIAGNOSIS — J432 Centrilobular emphysema: Secondary | ICD-10-CM | POA: Diagnosis not present

## 2022-03-06 DIAGNOSIS — Z9981 Dependence on supplemental oxygen: Secondary | ICD-10-CM | POA: Diagnosis not present

## 2022-03-06 DIAGNOSIS — J9601 Acute respiratory failure with hypoxia: Secondary | ICD-10-CM | POA: Diagnosis not present

## 2022-03-06 DIAGNOSIS — F17210 Nicotine dependence, cigarettes, uncomplicated: Secondary | ICD-10-CM | POA: Diagnosis not present

## 2022-03-06 DIAGNOSIS — E785 Hyperlipidemia, unspecified: Secondary | ICD-10-CM | POA: Diagnosis not present

## 2022-03-06 DIAGNOSIS — Z9181 History of falling: Secondary | ICD-10-CM | POA: Diagnosis not present

## 2022-03-06 DIAGNOSIS — F411 Generalized anxiety disorder: Secondary | ICD-10-CM | POA: Diagnosis not present

## 2022-03-06 DIAGNOSIS — N401 Enlarged prostate with lower urinary tract symptoms: Secondary | ICD-10-CM | POA: Diagnosis not present

## 2022-03-06 DIAGNOSIS — I714 Abdominal aortic aneurysm, without rupture, unspecified: Secondary | ICD-10-CM | POA: Diagnosis not present

## 2022-03-06 NOTE — Telephone Encounter (Signed)
5 days supply sent until he was seen by urology.  Urology should continue this if they feel it is appropriate.

## 2022-03-06 NOTE — Telephone Encounter (Signed)
Pharmacy  ([facility] [phone]) called - states that patient has switched to them as the primary pharmacy - states they received a transfer script for  urecholine '5mg'$  TID - qty #14 - they states this is only a 5 day supply for patient. Requesting a new script for qty #90 for one month supply if pt is to continue on this if appropriate.

## 2022-03-07 NOTE — Telephone Encounter (Signed)
Pharmacy informed and will contact patient to see who his uroligist is and will forward message to them.

## 2022-03-11 ENCOUNTER — Telehealth: Payer: Self-pay

## 2022-03-11 NOTE — Telephone Encounter (Signed)
Patient's son called to see if you can refill 2 medications that he was prescribed while he was in rehab.

## 2022-03-11 NOTE — Telephone Encounter (Signed)
Please return call and see what medications he is referring to.   Thanks!

## 2022-03-12 NOTE — Telephone Encounter (Signed)
I called Ronnie Snyder and his wife answered. She doesn't know what medication he needs. She will call [name], the son, to find out what medication is needed. She will call back.

## 2022-03-13 DIAGNOSIS — M80021D Age-related osteoporosis with current pathological fracture, right humerus, subsequent encounter for fracture with routine healing: Secondary | ICD-10-CM | POA: Diagnosis not present

## 2022-03-13 DIAGNOSIS — R339 Retention of urine, unspecified: Secondary | ICD-10-CM | POA: Diagnosis not present

## 2022-03-13 DIAGNOSIS — Z466 Encounter for fitting and adjustment of urinary device: Secondary | ICD-10-CM | POA: Diagnosis not present

## 2022-03-13 DIAGNOSIS — J9602 Acute respiratory failure with hypercapnia: Secondary | ICD-10-CM | POA: Diagnosis not present

## 2022-03-13 DIAGNOSIS — J432 Centrilobular emphysema: Secondary | ICD-10-CM | POA: Diagnosis not present

## 2022-03-13 DIAGNOSIS — J9601 Acute respiratory failure with hypoxia: Secondary | ICD-10-CM | POA: Diagnosis not present

## 2022-03-14 DIAGNOSIS — R339 Retention of urine, unspecified: Secondary | ICD-10-CM | POA: Diagnosis not present

## 2022-03-14 DIAGNOSIS — J9602 Acute respiratory failure with hypercapnia: Secondary | ICD-10-CM | POA: Diagnosis not present

## 2022-03-14 DIAGNOSIS — Z466 Encounter for fitting and adjustment of urinary device: Secondary | ICD-10-CM | POA: Diagnosis not present

## 2022-03-14 DIAGNOSIS — M80021D Age-related osteoporosis with current pathological fracture, right humerus, subsequent encounter for fracture with routine healing: Secondary | ICD-10-CM | POA: Diagnosis not present

## 2022-03-14 DIAGNOSIS — J9601 Acute respiratory failure with hypoxia: Secondary | ICD-10-CM | POA: Diagnosis not present

## 2022-03-14 DIAGNOSIS — J432 Centrilobular emphysema: Secondary | ICD-10-CM | POA: Diagnosis not present

## 2022-03-18 NOTE — Telephone Encounter (Signed)
Called patient's wife - she gave me the number to the patient's son phone # [phone]. She states he gets off of work at 3:30 and to call him after this. Attempted a call at 3:35pm had to leave a vm  message to return our call.

## 2022-03-19 ENCOUNTER — Telehealth: Payer: Self-pay

## 2022-03-19 NOTE — Telephone Encounter (Signed)
  [facility] VASCULAR CENTER   MULTIDISCIPLINARY HEART VALVE TEAM  Left message for pt and son to contact me in regards to update for structural heart consultation.

## 2022-03-20 DIAGNOSIS — M80021D Age-related osteoporosis with current pathological fracture, right humerus, subsequent encounter for fracture with routine healing: Secondary | ICD-10-CM | POA: Diagnosis not present

## 2022-03-20 DIAGNOSIS — Z466 Encounter for fitting and adjustment of urinary device: Secondary | ICD-10-CM | POA: Diagnosis not present

## 2022-03-20 DIAGNOSIS — J9601 Acute respiratory failure with hypoxia: Secondary | ICD-10-CM | POA: Diagnosis not present

## 2022-03-20 DIAGNOSIS — R339 Retention of urine, unspecified: Secondary | ICD-10-CM | POA: Diagnosis not present

## 2022-03-20 DIAGNOSIS — J432 Centrilobular emphysema: Secondary | ICD-10-CM | POA: Diagnosis not present

## 2022-03-20 DIAGNOSIS — J9602 Acute respiratory failure with hypercapnia: Secondary | ICD-10-CM | POA: Diagnosis not present

## 2022-03-25 ENCOUNTER — Other Ambulatory Visit: Payer: Self-pay | Admitting: Family Medicine

## 2022-03-28 ENCOUNTER — Other Ambulatory Visit: Payer: Self-pay | Admitting: Family Medicine

## 2022-03-29 ENCOUNTER — Telehealth: Payer: Self-pay

## 2022-03-29 ENCOUNTER — Encounter (HOSPITAL_COMMUNITY): Payer: Self-pay

## 2022-03-29 ENCOUNTER — Other Ambulatory Visit: Payer: Self-pay

## 2022-03-29 ENCOUNTER — Inpatient Hospital Stay (HOSPITAL_COMMUNITY)
Admission: EM | Admit: 2022-03-29 | Discharge: 2022-04-02 | DRG: 698 | Disposition: A | Discharge status: Skilled Nursing Facility | Payer: Medicare Other | Attending: Internal Medicine | Admitting: Internal Medicine

## 2022-03-29 ENCOUNTER — Emergency Department (HOSPITAL_COMMUNITY): Payer: Medicare Other

## 2022-03-29 DIAGNOSIS — R262 Difficulty in walking, not elsewhere classified: Secondary | ICD-10-CM | POA: Diagnosis present

## 2022-03-29 DIAGNOSIS — Z7401 Bed confinement status: Secondary | ICD-10-CM | POA: Diagnosis not present

## 2022-03-29 DIAGNOSIS — Y846 Urinary catheterization as the cause of abnormal reaction of the patient, or of later complication, without mention of misadventure at the time of the procedure: Secondary | ICD-10-CM | POA: Diagnosis present

## 2022-03-29 DIAGNOSIS — Z8673 Personal history of transient ischemic attack (TIA), and cerebral infarction without residual deficits: Secondary | ICD-10-CM

## 2022-03-29 DIAGNOSIS — Z79890 Hormone replacement therapy (postmenopausal): Secondary | ICD-10-CM

## 2022-03-29 DIAGNOSIS — J449 Chronic obstructive pulmonary disease, unspecified: Secondary | ICD-10-CM | POA: Diagnosis present

## 2022-03-29 DIAGNOSIS — Z515 Encounter for palliative care: Secondary | ICD-10-CM

## 2022-03-29 DIAGNOSIS — J9611 Chronic respiratory failure with hypoxia: Secondary | ICD-10-CM | POA: Diagnosis not present

## 2022-03-29 DIAGNOSIS — Z79899 Other long term (current) drug therapy: Secondary | ICD-10-CM

## 2022-03-29 DIAGNOSIS — E039 Hypothyroidism, unspecified: Secondary | ICD-10-CM | POA: Diagnosis present

## 2022-03-29 DIAGNOSIS — B958 Unspecified staphylococcus as the cause of diseases classified elsewhere: Secondary | ICD-10-CM | POA: Diagnosis present

## 2022-03-29 DIAGNOSIS — I700 Atherosclerosis of aorta: Secondary | ICD-10-CM | POA: Diagnosis not present

## 2022-03-29 DIAGNOSIS — Z1152 Encounter for screening for COVID-19: Secondary | ICD-10-CM | POA: Diagnosis not present

## 2022-03-29 DIAGNOSIS — Z7982 Long term (current) use of aspirin: Secondary | ICD-10-CM

## 2022-03-29 DIAGNOSIS — L89152 Pressure ulcer of sacral region, stage 2: Secondary | ICD-10-CM | POA: Diagnosis present

## 2022-03-29 DIAGNOSIS — M79672 Pain in left foot: Secondary | ICD-10-CM | POA: Diagnosis present

## 2022-03-29 DIAGNOSIS — Z888 Allergy status to other drugs, medicaments and biological substances status: Secondary | ICD-10-CM

## 2022-03-29 DIAGNOSIS — Z9981 Dependence on supplemental oxygen: Secondary | ICD-10-CM | POA: Diagnosis not present

## 2022-03-29 DIAGNOSIS — E43 Unspecified severe protein-calorie malnutrition: Secondary | ICD-10-CM | POA: Diagnosis not present

## 2022-03-29 DIAGNOSIS — J9602 Acute respiratory failure with hypercapnia: Secondary | ICD-10-CM | POA: Diagnosis not present

## 2022-03-29 DIAGNOSIS — Z880 Allergy status to penicillin: Secondary | ICD-10-CM

## 2022-03-29 DIAGNOSIS — R0689 Other abnormalities of breathing: Secondary | ICD-10-CM | POA: Diagnosis not present

## 2022-03-29 DIAGNOSIS — Z8249 Family history of ischemic heart disease and other diseases of the circulatory system: Secondary | ICD-10-CM | POA: Diagnosis not present

## 2022-03-29 DIAGNOSIS — F17210 Nicotine dependence, cigarettes, uncomplicated: Secondary | ICD-10-CM | POA: Diagnosis present

## 2022-03-29 DIAGNOSIS — R339 Retention of urine, unspecified: Secondary | ICD-10-CM | POA: Diagnosis present

## 2022-03-29 DIAGNOSIS — E7800 Pure hypercholesterolemia, unspecified: Secondary | ICD-10-CM | POA: Diagnosis present

## 2022-03-29 DIAGNOSIS — T83511S Infection and inflammatory reaction due to indwelling urethral catheter, sequela: Secondary | ICD-10-CM | POA: Diagnosis not present

## 2022-03-29 DIAGNOSIS — Z681 Body mass index (BMI) 19 or less, adult: Secondary | ICD-10-CM | POA: Diagnosis not present

## 2022-03-29 DIAGNOSIS — R5381 Other malaise: Secondary | ICD-10-CM | POA: Diagnosis present

## 2022-03-29 DIAGNOSIS — I6330 Cerebral infarction due to thrombosis of unspecified cerebral artery: Secondary | ICD-10-CM | POA: Diagnosis not present

## 2022-03-29 DIAGNOSIS — N390 Urinary tract infection, site not specified: Secondary | ICD-10-CM | POA: Diagnosis not present

## 2022-03-29 DIAGNOSIS — R64 Cachexia: Secondary | ICD-10-CM | POA: Diagnosis present

## 2022-03-29 DIAGNOSIS — N3000 Acute cystitis without hematuria: Secondary | ICD-10-CM | POA: Diagnosis not present

## 2022-03-29 DIAGNOSIS — R059 Cough, unspecified: Secondary | ICD-10-CM | POA: Diagnosis not present

## 2022-03-29 DIAGNOSIS — I10 Essential (primary) hypertension: Secondary | ICD-10-CM | POA: Diagnosis present

## 2022-03-29 DIAGNOSIS — Z86010 Personal history of colonic polyps: Secondary | ICD-10-CM | POA: Diagnosis not present

## 2022-03-29 DIAGNOSIS — T83511A Infection and inflammatory reaction due to indwelling urethral catheter, initial encounter: Secondary | ICD-10-CM | POA: Diagnosis not present

## 2022-03-29 DIAGNOSIS — B952 Enterococcus as the cause of diseases classified elsewhere: Secondary | ICD-10-CM | POA: Diagnosis present

## 2022-03-29 DIAGNOSIS — K219 Gastro-esophageal reflux disease without esophagitis: Secondary | ICD-10-CM | POA: Diagnosis present

## 2022-03-29 DIAGNOSIS — E46 Unspecified protein-calorie malnutrition: Secondary | ICD-10-CM | POA: Diagnosis present

## 2022-03-29 DIAGNOSIS — Z85828 Personal history of other malignant neoplasm of skin: Secondary | ICD-10-CM | POA: Diagnosis not present

## 2022-03-29 DIAGNOSIS — M1990 Unspecified osteoarthritis, unspecified site: Secondary | ICD-10-CM | POA: Diagnosis present

## 2022-03-29 DIAGNOSIS — Z885 Allergy status to narcotic agent status: Secondary | ICD-10-CM

## 2022-03-29 DIAGNOSIS — I959 Hypotension, unspecified: Secondary | ICD-10-CM | POA: Diagnosis not present

## 2022-03-29 DIAGNOSIS — L89109 Pressure ulcer of unspecified part of back, unspecified stage: Secondary | ICD-10-CM | POA: Diagnosis present

## 2022-03-29 DIAGNOSIS — Z79631 Long term (current) use of antimetabolite agent: Secondary | ICD-10-CM

## 2022-03-29 DIAGNOSIS — R531 Weakness: Secondary | ICD-10-CM | POA: Diagnosis not present

## 2022-03-29 DIAGNOSIS — J9601 Acute respiratory failure with hypoxia: Secondary | ICD-10-CM | POA: Diagnosis not present

## 2022-03-29 LAB — CBC WITH DIFFERENTIAL/PLATELET
Abs Immature Granulocytes: 0.09 10*3/uL — ABNORMAL HIGH (ref 0.00–0.07)
Basophils Absolute: 0 10*3/uL (ref 0.0–0.1)
Basophils Relative: 1 %
Eosinophils Absolute: 0 10*3/uL (ref 0.0–0.5)
Eosinophils Relative: 1 %
HCT: 32.2 % — ABNORMAL LOW (ref 39.0–52.0)
Hemoglobin: 10.1 g/dL — ABNORMAL LOW (ref 13.0–17.0)
Immature Granulocytes: 2 %
Lymphocytes Relative: 6 %
Lymphs Abs: 0.4 10*3/uL — ABNORMAL LOW (ref 0.7–4.0)
MCH: 32.2 pg (ref 26.0–34.0)
MCHC: 31.4 g/dL (ref 30.0–36.0)
MCV: 102.5 fL — ABNORMAL HIGH (ref 80.0–100.0)
Monocytes Absolute: 0.8 10*3/uL (ref 0.1–1.0)
Monocytes Relative: 13 %
Neutro Abs: 4.7 10*3/uL (ref 1.7–7.7)
Neutrophils Relative %: 77 %
Platelets: 186 10*3/uL (ref 150–400)
RBC: 3.14 MIL/uL — ABNORMAL LOW (ref 4.22–5.81)
RDW: 16 % — ABNORMAL HIGH (ref 11.5–15.5)
WBC: 6.1 10*3/uL (ref 4.0–10.5)
nRBC: 0 % (ref 0.0–0.2)

## 2022-03-29 LAB — RESP PANEL BY RT-PCR (FLU A&B, COVID) ARPGX2
Influenza A by PCR: NEGATIVE
Influenza B by PCR: NEGATIVE
SARS Coronavirus 2 by RT PCR: NEGATIVE

## 2022-03-29 LAB — URINALYSIS, ROUTINE W REFLEX MICROSCOPIC
Bilirubin Urine: NEGATIVE
Glucose, UA: NEGATIVE mg/dL
Ketones, ur: NEGATIVE mg/dL
Nitrite: NEGATIVE
Protein, ur: 100 mg/dL — AB
RBC / HPF: >50 RBC/hpf — ABNORMAL HIGH (ref 0–5)
Specific Gravity, Urine: 1.019 (ref 1.005–1.030)
WBC, UA: >50 WBC/hpf — ABNORMAL HIGH (ref 0–5)
pH: 7 (ref 5.0–8.0)

## 2022-03-29 LAB — BASIC METABOLIC PANEL
Anion gap: 8 (ref 5–15)
BUN: 24 mg/dL — ABNORMAL HIGH (ref 8–23)
CO2: 44 mmol/L — ABNORMAL HIGH (ref 22–32)
Calcium: 9.2 mg/dL (ref 8.9–10.3)
Chloride: 82 mmol/L — ABNORMAL LOW (ref 98–111)
Creatinine, Ser: 0.36 mg/dL — ABNORMAL LOW (ref 0.61–1.24)
GFR, Estimated: >60 mL/min (ref >60)
Glucose, Bld: 109 mg/dL — ABNORMAL HIGH (ref 70–99)
Potassium: 3.4 mmol/L — ABNORMAL LOW (ref 3.5–5.1)
Sodium: 134 mmol/L — ABNORMAL LOW (ref 135–145)

## 2022-03-29 MED ORDER — SODIUM CHLORIDE 0.9 % IV BOLUS
500.0000 mL | Freq: Once | INTRAVENOUS | Status: AC
  Administered 2022-03-29: 500 mL via INTRAVENOUS

## 2022-03-29 MED ORDER — SODIUM CHLORIDE 0.9 % IV SOLN
2.0000 g | Freq: Once | INTRAVENOUS | Status: AC
  Administered 2022-03-29: 2 g via INTRAVENOUS
  Filled 2022-03-29: qty 20

## 2022-03-29 MED ORDER — SODIUM CHLORIDE 0.9 % IV SOLN
2.0000 g | INTRAVENOUS | Status: DC
  Administered 2022-03-30: 2 g via INTRAVENOUS
  Filled 2022-03-29: qty 20

## 2022-03-29 MED ORDER — POTASSIUM CHLORIDE CRYS ER 20 MEQ PO TBCR
20.0000 meq | EXTENDED_RELEASE_TABLET | Freq: Two times a day (BID) | ORAL | Status: AC
  Administered 2022-03-29 – 2022-03-30 (×4): 20 meq via ORAL
  Filled 2022-03-29 (×4): qty 1

## 2022-03-29 NOTE — Telephone Encounter (Signed)
Patient's son lvm requesting Rx be sent to [facility] for an oxygen mask for duoneb.   Sent to Dr. Zigmund Daniel for consideration.

## 2022-03-29 NOTE — ED Triage Notes (Addendum)
Pt bib ems from home pt has foley catheter, dark urine noted. Pt denies any symptoms Pt son states pt has been in bed for the past two days, has not been eating and drinking like he normally does.

## 2022-03-29 NOTE — ED Notes (Signed)
Called the son [name] with room number when patient is moved

## 2022-03-29 NOTE — ED Provider Notes (Signed)
[name] Provider Note   CSN: [id number] Arrival date & time: 03/29/22  0901     History  Chief Complaint  Patient presents with   possible uti    Ronnie Snyder is a 81 y.o. male.  HPI Patient brought in by EMS.  Reportedly worried for dehydration or UTI.  Has chronic Foley catheter.  Patient without complaint.  However for son called and reportedly said that he has been in bed and not eating and drinking.  Patient states he was sent in for his left foot pain.  No trauma.  Urine in Foley is dark.   Past Medical History:  Diagnosis Date   Abdominal aortic aneurysm ([facility])    3.5 cm 10/2016 L-spine CT (previously 3.0 cm by U/S 02/25/11)   Anxiety    Arthritis    "in my feet" (10/13/2012)   GERD (gastroesophageal reflux disease)    H/O hiatal hernia    Hepatic steatosis    High cholesterol    "at one time; it's fine now" (10/13/2012)   HTN (hypertension)    Hypothyroidism    PAD (peripheral artery disease) (H[facility])    Skin cancer    "burned them off my arm and such" (10/13/2012)   Stroke [facility])    Tubular adenoma of colon 10/2019    Home Medications Prior to Admission medications   Medication Sig Start Date End Date Taking? Authorizing Provider  acetaminophen (TYLENOL) 500 MG tablet Take 1,000 mg by mouth 2 (two) times daily.    [provider]  aspirin EC 81 MG tablet Take 1 tablet (81 mg total) by mouth daily. Swallow whole. 12/04/21   Hosie Poisson, MD  bethanechol (URECHOLINE) 5 MG tablet Take 1 tablet (5 mg total) by mouth 3 (three) times daily. 03/03/22   Luetta Nutting, DO  Cholecalciferol (VITAMIN D3 GUMMIES PO) Take 1 tablet by mouth every morning.    [provider]  docusate sodium (COLACE) 100 MG capsule Take 1 capsule (100 mg total) by mouth 2 (two) times daily as needed for mild constipation. 12/03/21   Hosie Poisson, MD  FEROSUL 325 (65 Fe) MG tablet TAKE 1 TABLET(325 MG) BY MOUTH DAILY WITH  BREAKFAST Patient taking differently: Take 325 mg by mouth daily with breakfast. 07/31/21   Luetta Nutting, DO  folic acid (FOLVITE) 1 MG tablet Take 1 mg by mouth every morning.    [provider]  hydrochlorothiazide (HYDRODIURIL) 25 MG tablet Take 1 tablet (25 mg total) by mouth daily. 03/01/22 03/31/22  Luetta Nutting, DO  ipratropium-albuterol (DUONEB) 0.5-2.5 (3) MG/3ML SOLN Take 3 mLs by nebulization every 6 (six) hours as needed. 12/03/21   Hosie Poisson, MD  levothyroxine (SYNTHROID) 75 MCG tablet TAKE 1 TABLET(75 MCG) BY MOUTH DAILY BEFORE AND BREAKFAST Patient taking differently: Take 75 mcg by mouth daily before breakfast. 09/05/21   Luetta Nutting, DO  methotrexate (RHEUMATREX) 2.5 MG tablet Take 20 mg by mouth every Monday. Caution:Chemotherapy. Protect from light.    [provider]  mineral oil liquid Take 5 mLs by mouth every evening.    [provider]  pantoprazole (PROTONIX) 40 MG tablet TAKE ONE TABLET DAILY 02/28/22   Luetta Nutting, DO  polyethylene glycol (MIRALAX / GLYCOLAX) 17 g packet Take 17 g by mouth daily as needed for moderate constipation. 12/03/21   Hosie Poisson, MD  rosuvastatin (CRESTOR) 20 MG tablet TAKE ONE TABLET DAILY 03/26/22   Luetta Nutting, DO  tamsulosin (FLOMAX) 0.4 MG CAPS capsule  TAKE ONE CAPSULE DAILY 03/26/22   Luetta Nutting, DO  traZODone (DESYREL) 50 MG tablet Take 0.5 tablets (25 mg total) by mouth at bedtime. 03/01/22   Luetta Nutting, DO      Allergies    Hydrocodone-acetaminophen, Atorvastatin, Other, Penicillins, and Pioglitazone    Review of Systems   Review of Systems  Constitutional:  Negative for fever.    Physical Exam Updated Vital Signs BP (!) 150/79   Pulse 81   Temp 98.5 F (36.9 C) (Oral)   Resp 15   Ht '5\' 7"'$  (1.702 m)   Wt 50 kg   SpO2 92%   BMI 17.26 kg/m  Physical Exam Vitals reviewed.  HENT:     Nose:     Comments: Patient on chronic oxygen. Eyes:     Pupils: Pupils are equal,  round, and reactive to light.  Cardiovascular:     Rate and Rhythm: Normal rate.  Pulmonary:     Breath sounds: No wheezing or rhonchi.  Abdominal:     Tenderness: There is no abdominal tenderness.  Genitourinary:    Comments: Foley catheter in place with dark urine. Skin:    General: Skin is warm.     Capillary Refill: Capillary refill takes less than 2 seconds.  Neurological:     Mental Status: He is alert. Mental status is at baseline.     ED Results / Procedures / Treatments   Labs (all labs ordered are listed, but only abnormal results are displayed) Labs Reviewed  CBC WITH DIFFERENTIAL/PLATELET - Abnormal; Notable for the following components:      Result Value   RBC 3.14 (*)    Hemoglobin 10.1 (*)    HCT 32.2 (*)    MCV 102.5 (*)    RDW 16.0 (*)    Lymphs Abs 0.4 (*)    Abs Immature Granulocytes 0.09 (*)    All other components within normal limits  URINALYSIS, ROUTINE W REFLEX MICROSCOPIC - Abnormal; Notable for the following components:   Color, Urine AMBER (*)    APPearance CLOUDY (*)    Hgb urine dipstick MODERATE (*)    Protein, ur 100 (*)    Leukocytes,Ua LARGE (*)    RBC / HPF >50 (*)    WBC, UA >50 (*)    Bacteria, UA MANY (*)    All other components within normal limits  BASIC METABOLIC PANEL - Abnormal; Notable for the following components:   Sodium 134 (*)    Potassium 3.4 (*)    Chloride 82 (*)    CO2 44 (*)    Glucose, Bld 109 (*)    BUN 24 (*)    Creatinine, Ser 0.36 (*)    All other components within normal limits  URINE CULTURE  RESP PANEL BY RT-PCR (FLU A&B, COVID) ARPGX2    EKG None  Radiology No results found.  Procedures Procedures    Medications Ordered in ED Medications  cefTRIAXone (ROCEPHIN) 2 g in sodium chloride 0.9 % 100 mL IVPB (has no administration in time range)  sodium chloride 0.9 % bolus 500 mL (500 mLs Intravenous New Bag/Given 03/29/22 1248)    ED Course/ Medical Decision Making/ A&P                            Medical Decision Making Amount and/or Complexity of Data Reviewed Labs: ordered. Radiology: ordered.   Patient reportedly sent in for decreased energy level.  Decreased oral intake.  Decreased amount of urine.  Urine is darker.  Patient is really without complaints and does not want to be here.  Will check basic blood work urinalysis.  Not hypoxic on his baseline oxygen.   Lab work is overall is reassuring.  Kidney function at baseline.  White count not elevated.  However does have urinary tract infection.  I think this is likely the cause of his feeling weak and more fatigued.  Fluid bolus given and will give antibiotics.  Has really been unable to get out of bed for the last 2 days.  With this I feel he would benefit from mission to the hospital.  Does not appear overtly septic at this time however.  Will discuss with hospitalist for admission.        Final Clinical Impression(s) / ED Diagnoses Final diagnoses:  Urinary tract infection associated with indwelling urethral catheter, initial encounter [facility])    Rx / [place] Orders ED Discharge Orders     None         Davonna Belling, MD 03/29/22 1421
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
## 2022-03-29 NOTE — Progress Notes (Signed)
Introduced self to patient and explained I need to do the nursing admission history for the nurses upstairs. Pt's son is at bedside and pt states it is ok for son to be present while he answers questions. Son states all those answers are in the computer. Pt then states all answers are all in the computer. Unable to complete nursing admission history as they do not want to answer the questions. Pt is wanting to know when he will go upstairs. I told him that his nurse will let him know when there is a room available.  Doroteo Bradford BSN, RN-BC Throughput Nurse 03/29/2022 3:51 PM

## 2022-03-30 DIAGNOSIS — L89152 Pressure ulcer of sacral region, stage 2: Secondary | ICD-10-CM | POA: Diagnosis present

## 2022-03-30 DIAGNOSIS — Z85828 Personal history of other malignant neoplasm of skin: Secondary | ICD-10-CM | POA: Diagnosis not present

## 2022-03-30 DIAGNOSIS — Z7401 Bed confinement status: Secondary | ICD-10-CM | POA: Diagnosis not present

## 2022-03-30 DIAGNOSIS — Z515 Encounter for palliative care: Secondary | ICD-10-CM | POA: Diagnosis not present

## 2022-03-30 DIAGNOSIS — J9602 Acute respiratory failure with hypercapnia: Secondary | ICD-10-CM | POA: Diagnosis not present

## 2022-03-30 DIAGNOSIS — E039 Hypothyroidism, unspecified: Secondary | ICD-10-CM | POA: Diagnosis present

## 2022-03-30 DIAGNOSIS — R339 Retention of urine, unspecified: Secondary | ICD-10-CM | POA: Diagnosis present

## 2022-03-30 DIAGNOSIS — E43 Unspecified severe protein-calorie malnutrition: Secondary | ICD-10-CM | POA: Diagnosis present

## 2022-03-30 DIAGNOSIS — Z880 Allergy status to penicillin: Secondary | ICD-10-CM | POA: Diagnosis not present

## 2022-03-30 DIAGNOSIS — Z8249 Family history of ischemic heart disease and other diseases of the circulatory system: Secondary | ICD-10-CM | POA: Diagnosis not present

## 2022-03-30 DIAGNOSIS — J9611 Chronic respiratory failure with hypoxia: Secondary | ICD-10-CM

## 2022-03-30 DIAGNOSIS — J449 Chronic obstructive pulmonary disease, unspecified: Secondary | ICD-10-CM | POA: Diagnosis present

## 2022-03-30 DIAGNOSIS — M79672 Pain in left foot: Secondary | ICD-10-CM | POA: Diagnosis present

## 2022-03-30 DIAGNOSIS — F17210 Nicotine dependence, cigarettes, uncomplicated: Secondary | ICD-10-CM | POA: Diagnosis present

## 2022-03-30 DIAGNOSIS — N3000 Acute cystitis without hematuria: Secondary | ICD-10-CM | POA: Diagnosis not present

## 2022-03-30 DIAGNOSIS — N390 Urinary tract infection, site not specified: Secondary | ICD-10-CM | POA: Diagnosis present

## 2022-03-30 DIAGNOSIS — R64 Cachexia: Secondary | ICD-10-CM | POA: Diagnosis present

## 2022-03-30 DIAGNOSIS — Y846 Urinary catheterization as the cause of abnormal reaction of the patient, or of later complication, without mention of misadventure at the time of the procedure: Secondary | ICD-10-CM | POA: Diagnosis present

## 2022-03-30 DIAGNOSIS — I6330 Cerebral infarction due to thrombosis of unspecified cerebral artery: Secondary | ICD-10-CM | POA: Diagnosis not present

## 2022-03-30 DIAGNOSIS — Z1152 Encounter for screening for COVID-19: Secondary | ICD-10-CM | POA: Diagnosis not present

## 2022-03-30 DIAGNOSIS — Z8673 Personal history of transient ischemic attack (TIA), and cerebral infarction without residual deficits: Secondary | ICD-10-CM | POA: Diagnosis not present

## 2022-03-30 DIAGNOSIS — Z681 Body mass index (BMI) 19 or less, adult: Secondary | ICD-10-CM | POA: Diagnosis not present

## 2022-03-30 DIAGNOSIS — T83511S Infection and inflammatory reaction due to indwelling urethral catheter, sequela: Secondary | ICD-10-CM | POA: Diagnosis not present

## 2022-03-30 DIAGNOSIS — E7800 Pure hypercholesterolemia, unspecified: Secondary | ICD-10-CM | POA: Diagnosis present

## 2022-03-30 DIAGNOSIS — R531 Weakness: Secondary | ICD-10-CM | POA: Diagnosis not present

## 2022-03-30 DIAGNOSIS — Z9981 Dependence on supplemental oxygen: Secondary | ICD-10-CM | POA: Diagnosis not present

## 2022-03-30 DIAGNOSIS — K219 Gastro-esophageal reflux disease without esophagitis: Secondary | ICD-10-CM | POA: Diagnosis present

## 2022-03-30 DIAGNOSIS — T83511A Infection and inflammatory reaction due to indwelling urethral catheter, initial encounter: Secondary | ICD-10-CM | POA: Diagnosis present

## 2022-03-30 DIAGNOSIS — J9601 Acute respiratory failure with hypoxia: Secondary | ICD-10-CM | POA: Diagnosis not present

## 2022-03-30 DIAGNOSIS — I10 Essential (primary) hypertension: Secondary | ICD-10-CM | POA: Diagnosis present

## 2022-03-30 DIAGNOSIS — Z86010 Personal history of colonic polyps: Secondary | ICD-10-CM | POA: Diagnosis not present

## 2022-03-30 LAB — CBG MONITORING, ED: Glucose-Capillary: 73 mg/dL (ref 70–99)

## 2022-03-30 MED ORDER — POLYETHYLENE GLYCOL 3350 17 G PO PACK: 17.0000 g | PACK | Freq: Every day | ORAL | Status: DC | PRN

## 2022-03-30 MED ORDER — ENOXAPARIN SODIUM 30 MG/0.3ML IJ SOSY
30.0000 mg | PREFILLED_SYRINGE | INTRAMUSCULAR | Status: DC
  Administered 2022-03-30: 30 mg via SUBCUTANEOUS
  Filled 2022-03-30: qty 0.3

## 2022-03-30 MED ORDER — LEVOTHYROXINE SODIUM 75 MCG PO TABS
75.0000 ug | ORAL_TABLET | Freq: Every day | ORAL | Status: DC
  Administered 2022-03-30 – 2022-04-02 (×4): 75 ug via ORAL
  Filled 2022-03-30 (×4): qty 1

## 2022-03-30 MED ORDER — BETHANECHOL CHLORIDE 10 MG PO TABS
5.0000 mg | ORAL_TABLET | Freq: Three times a day (TID) | ORAL | Status: DC
  Administered 2022-03-30 – 2022-04-02 (×10): 5 mg via ORAL
  Filled 2022-03-30 (×12): qty 0.5

## 2022-03-30 MED ORDER — DOCUSATE SODIUM 100 MG PO CAPS: 100.0000 mg | ORAL_CAPSULE | Freq: Two times a day (BID) | ORAL | Status: DC | PRN

## 2022-03-30 MED ORDER — TAMSULOSIN HCL 0.4 MG PO CAPS
0.4000 mg | ORAL_CAPSULE | Freq: Every day | ORAL | Status: DC
  Administered 2022-03-30 – 2022-04-02 (×4): 0.4 mg via ORAL
  Filled 2022-03-30 (×4): qty 1

## 2022-03-30 MED ORDER — IPRATROPIUM-ALBUTEROL 0.5-2.5 (3) MG/3ML IN SOLN
3.0000 mL | RESPIRATORY_TRACT | Status: DC | PRN
  Administered 2022-03-30 – 2022-04-01 (×3): 3 mL via RESPIRATORY_TRACT
  Filled 2022-03-30 (×3): qty 3

## 2022-03-30 MED ORDER — TRAZODONE HCL 50 MG PO TABS
25.0000 mg | ORAL_TABLET | Freq: Every day | ORAL | Status: DC
  Administered 2022-03-30 – 2022-04-01 (×4): 25 mg via ORAL
  Filled 2022-03-30 (×4): qty 1

## 2022-03-30 MED ORDER — ROSUVASTATIN CALCIUM 20 MG PO TABS
20.0000 mg | ORAL_TABLET | Freq: Every day | ORAL | Status: DC
  Administered 2022-03-31 – 2022-04-01 (×2): 20 mg via ORAL
  Filled 2022-03-30 (×3): qty 1

## 2022-03-30 MED ORDER — ASPIRIN 81 MG PO TBEC
81.0000 mg | DELAYED_RELEASE_TABLET | Freq: Every day | ORAL | Status: DC
  Administered 2022-03-30 – 2022-04-02 (×4): 81 mg via ORAL
  Filled 2022-03-30 (×4): qty 1

## 2022-03-30 MED ORDER — LEVOTHYROXINE SODIUM 50 MCG PO TABS: 75.0000 ug | ORAL_TABLET | Freq: Every day | ORAL | Status: DC

## 2022-03-30 MED ORDER — FOLIC ACID 1 MG PO TABS
1.0000 mg | ORAL_TABLET | Freq: Every morning | ORAL | Status: DC
  Administered 2022-03-30 – 2022-04-02 (×4): 1 mg via ORAL
  Filled 2022-03-30 (×4): qty 1

## 2022-03-30 MED ORDER — PANTOPRAZOLE SODIUM 40 MG PO TBEC
40.0000 mg | DELAYED_RELEASE_TABLET | Freq: Every day | ORAL | Status: DC
  Administered 2022-03-30 – 2022-04-02 (×4): 40 mg via ORAL
  Filled 2022-03-30 (×4): qty 1

## 2022-03-30 MED ORDER — HYDROCHLOROTHIAZIDE 25 MG PO TABS
25.0000 mg | ORAL_TABLET | Freq: Every day | ORAL | Status: DC
  Administered 2022-03-30 – 2022-04-02 (×4): 25 mg via ORAL
  Filled 2022-03-30: qty 1
  Filled 2022-03-30: qty 2
  Filled 2022-03-30 (×2): qty 1

## 2022-03-30 MED ORDER — FERROUS SULFATE 325 (65 FE) MG PO TABS
325.0000 mg | ORAL_TABLET | Freq: Every day | ORAL | Status: DC
  Administered 2022-03-30 – 2022-04-02 (×4): 325 mg via ORAL
  Filled 2022-03-30 (×4): qty 1

## 2022-03-30 MED ORDER — SODIUM CHLORIDE 0.9 % IV SOLN: INTRAVENOUS | Status: DC

## 2022-03-30 MED ORDER — ACETAMINOPHEN 500 MG PO TABS
1000.0000 mg | ORAL_TABLET | Freq: Two times a day (BID) | ORAL | Status: DC
  Administered 2022-03-30 – 2022-04-02 (×8): 1000 mg via ORAL
  Filled 2022-03-30 (×8): qty 2

## 2022-03-30 NOTE — Progress Notes (Signed)
PROGRESS NOTE    Ronnie Snyder  UUV:[id number] DOB: 06/25/1940 DOA: 03/29/2022 PCP: Luetta Nutting, DO    Brief Narrative:  81 year old gentleman with history of COPD and chronic hypoxemia on 2 L oxygen at home, GERD, urine retention with Foley catheter, generalized debility brought to the emergency room by family members due to him not getting out of the bed, unable to take care of self and noted dark urine in the catheter.  In the emergency room urine was grossly abnormal that was taken from indwelling Foley catheter.  Patient was hemodynamic stable.  Could not get out of the bed.  So mated to the hospital.   Assessment & Plan:   Acute UTI present on admission secondary to indwelling Foley catheter: Continue Rocephin pending final cultures.  Foley catheter was changed today.  Not a candidate for voiding trial.  Continue Flomax.   Debility and deconditioning: Profound physical deconditioning.  No adequate support system at home.  Will need more rehab.  Severe protein calorie malnutrition: Consult nutrition.  Chronic hypoxemia with COPD: Not in exacerbation.  Bronchodilators.  Oxygen to keep saturation more than 90%.  Hypertension: Stable. Hyperlipidemia: Stable.  Called and discussed with patient's son.  Patient lives with his wife who is not able to help him much.  Patient has been mostly laying in the bed.  Looks like there is poor support system at home.  Currently patient in need for a skilled nursing facility, however I updated patient's family that patient may need more supervised living care including assisted living facility care.  DVT prophylaxis: enoxaparin (LOVENOX) injection 30 mg Start: 03/30/22 1000   Code Status: Full code Family Communication: Son on the phone Disposition Plan: Status is: Observation The patient will require care spanning > 2 midnights and should be moved to inpatient because: UTI, debility, unable to walk.     Consultants:   None  Procedures:  None  Antimicrobials:  Rocephin 11/10---   Subjective: Patient was seen and examined.  Gets irritated on interview.  No other overnight events.  He is fixated on "they always asked me to drink orange juice and I do not like that".  Afebrile. Foley catheter was changed today, urine is flowing freely and clear.  Objective: Vitals:   03/30/22 1000 03/30/22 1015 03/30/22 1020 03/30/22 1327  BP: (!) 148/92 (!) 173/82  (!) 136/123  Pulse: 80 81  83  Resp: '20 16  16  '$ Temp:   98.2 F (36.8 C) 98.3 F (36.8 C)  TempSrc:    Oral  SpO2: 95% 100%  100%  Weight:      Height:        Intake/Output Summary (Last 24 hours) at 03/30/2022 1359 Last data filed at 03/29/2022 1603 Gross per 24 hour  Intake 1420 ml  Output --  Net 1420 ml   Filed Weights   03/29/22 0910  Weight: 50 kg    Examination:  General exam: Appears calm and comfortable  Tachycardic.  Frail and debilitated. Respiratory system: Clear to auscultation. Respiratory effort normal.  No added sounds. Cardiovascular system: S1 & S2 heard, RRR.  Gastrointestinal system: Soft.  Nontender.  Bowel sound present. Foley catheter with clear urine. Central nervous system: Alert and oriented x2-3.  Irritable on interview.  No focal deficits.    Data Reviewed: I have personally reviewed following labs and imaging studies  CBC: Recent Labs  Lab 03/29/22 0937  WBC 6.1  NEUTROABS 4.7  HGB 10.1*  HCT 32.2*  MCV  102.5*  PLT 419   Basic Metabolic Panel: Recent Labs  Lab 03/29/22 0937  NA 134*  K 3.4*  CL 82*  CO2 44*  GLUCOSE 109*  BUN 24*  CREATININE 0.36*  CALCIUM 9.2   GFR: Estimated Creatinine Clearance: 52.1 mL/min (A) (by C-G formula based on SCr of 0.36 mg/dL (L)). Liver Function Tests: No results for input(s): "AST", "ALT", "ALKPHOS", "BILITOT", "PROT", "ALBUMIN" in the last 168 hours. No results for input(s): "LIPASE", "AMYLASE" in the last 168 hours. No results for input(s):  "AMMONIA" in the last 168 hours. Coagulation Profile: No results for input(s): "INR", "PROTIME" in the last 168 hours. Cardiac Enzymes: No results for input(s): "CKTOTAL", "CKMB", "CKMBINDEX", "TROPONINI" in the last 168 hours. BNP (last 3 results) No results for input(s): "PROBNP" in the last 8760 hours. HbA1C: No results for input(s): "HGBA1C" in the last 72 hours. CBG: Recent Labs  Lab 03/30/22 0814  GLUCAP 73   Lipid Profile: No results for input(s): "CHOL", "HDL", "LDLCALC", "TRIG", "CHOLHDL", "LDLDIRECT" in the last 72 hours. Thyroid Function Tests: No results for input(s): "TSH", "T4TOTAL", "FREET4", "T3FREE", "THYROIDAB" in the last 72 hours. Anemia Panel: No results for input(s): "VITAMINB12", "FOLATE", "FERRITIN", "TIBC", "IRON", "RETICCTPCT" in the last 72 hours. Sepsis Labs: No results for input(s): "PROCALCITON", "LATICACIDVEN" in the last 168 hours.  Recent Results (from the past 240 hour(s))  Resp Panel by RT-PCR (Flu A&B, Covid) Anterior Nasal Swab     Status: None   Collection Time: 03/29/22  2:11 PM   Specimen: Anterior Nasal Swab  Result Value Ref Range Status   SARS Coronavirus 2 by RT PCR NEGATIVE NEGATIVE Final    Comment: (NOTE) SARS-CoV-2 target nucleic acids are NOT DETECTED.  The SARS-CoV-2 RNA is generally detectable in upper respiratory specimens during the acute phase of infection. The lowest concentration of SARS-CoV-2 viral copies this assay can detect is 138 copies/mL. A negative result does not preclude SARS-Cov-2 infection and should not be used as the sole basis for treatment or other patient management decisions. A negative result may occur with  improper specimen collection/handling, submission of specimen other than nasopharyngeal swab, presence of viral mutation(s) within the areas targeted by this assay, and inadequate number of viral copies(<138 copies/mL). A negative result must be combined with clinical observations, patient  history, and epidemiological information. The expected result is Negative.  Fact Sheet for Patients:  EntrepreneurPulse.com.au  Fact Sheet for Healthcare Providers:  IncredibleEmployment.be  This test is no t yet approved or cleared by the [place] FDA and  has been authorized for detection and/or diagnosis of SARS-CoV-2 by FDA under an Emergency Use Authorization (EUA). This EUA will remain  in effect (meaning this test can be used) for the duration of the COVID-19 declaration under Section 564(b)(1) of the Act, 21 U.S.C.section 360bbb-3(b)(1), unless the authorization is terminated  or revoked sooner.       Influenza A by PCR NEGATIVE NEGATIVE Final   Influenza B by PCR NEGATIVE NEGATIVE Final    Comment: (NOTE) The Xpert Xpress SARS-CoV-2/FLU/RSV plus assay is intended as an aid in the diagnosis of influenza from Nasopharyngeal swab specimens and should not be used as a sole basis for treatment. Nasal washings and aspirates are unacceptable for Xpert Xpress SARS-CoV-2/FLU/RSV testing.  Fact Sheet for Patients: EntrepreneurPulse.com.au  Fact Sheet for Healthcare Providers: IncredibleEmployment.be  This test is not yet approved or cleared by the [place] FDA and has been authorized for detection and/or diagnosis of SARS-CoV-2 by FDA  under an Emergency Use Authorization (EUA). This EUA will remain in effect (meaning this test can be used) for the duration of the COVID-19 declaration under Section 564(b)(1) of the Act, 21 U.S.C. section 360bbb-3(b)(1), unless the authorization is terminated or revoked.  Performed at [facility], [place] [address] [place], [place] [address]          Radiology Studies: DG Chest Portable 1 View  Result Date: 03/29/2022 CLINICAL DATA:  Cough EXAM: PORTABLE CHEST 1 VIEW COMPARISON:  11/28/2021 FINDINGS: The patient is rotated to the right  on today's radiograph, reducing diagnostic sensitivity and specificity. Substantial new airspace opacity in the right upper chest, thought to mostly be in the right upper lobe. Interval clearing of prior airspace opacity in the right lower lobe. Atherosclerotic calcification of the aortic arch. Heart size is within normal limits given the degree of rotation. Old left rib deformities compatible with healed fractures. Bony demineralization. IMPRESSION: 1. Substantial new airspace opacity in the right upper lobe, thought to mostly be in the right upper lobe, suspicious for pneumonia. 2. Interval clearing of prior airspace opacity in the right lower lobe. 3. Bony demineralization. Electronically Signed   By: Van Clines M.D.   On: 03/29/2022 14:42        Scheduled Meds:  acetaminophen  1,000 mg Oral BID   aspirin EC  81 mg Oral Daily   bethanechol  5 mg Oral TID   enoxaparin (LOVENOX) injection  30 mg Subcutaneous Q24H   ferrous sulfate  325 mg Oral Q breakfast   folic acid  1 mg Oral q morning   hydrochlorothiazide  25 mg Oral Daily   levothyroxine  75 mcg Oral Q0600   pantoprazole  40 mg Oral Daily   potassium chloride  20 mEq Oral BID   rosuvastatin  20 mg Oral Daily   tamsulosin  0.4 mg Oral Daily   traZODone  25 mg Oral QHS   Continuous Infusions:  sodium chloride Stopped (03/30/22 1146)   cefTRIAXone (ROCEPHIN)  IV Stopped (03/30/22 1113)     LOS: 0 days    Time spent: 35 minutes    Barb Merino, MD [facility] Pager [phone]

## 2022-03-30 NOTE — Progress Notes (Signed)
PT Cancellation Note  Patient Details Name: KENNTH VANBENSCHOTEN MRN: [id number] DOB: 1940-06-06   Cancelled Treatment:    Reason Eval/Treat Not Completed: Other (comment)  Pt reports to fatigued from OT eval and room change.  Will f/u at later date. Abran Richard, PT Acute Rehab [facility] [phone]  Karlton Lemon 03/30/2022, 3:54 PM

## 2022-03-30 NOTE — Evaluation (Signed)
Occupational Therapy Evaluation Patient Details Name: Ronnie Snyder MRN: [id number] DOB: 07/13/1940 Today's Date: 03/30/2022   History of Present Illness Patient is a 81 year old male who presented to the ED via EMS with report of not having been out of bed in 2 days.   PMH: anemia, HTN, stroke, HLD, GERD, severe ortic stenosis, R proximal humerus fx.   Clinical Impression   Patient is a 81 year old male who was admitted for above. Patient  reported living at home with wife and PRN support from son. Unclear PLOF as patient indicated not getting out of bed much at home, and no family in room at this time. Session was limited with incontinence episode of BM. Patient was noted to have decreased functional activity tolernace, decreased ROM, decreased BUE strength, decreased endurance, decreased sitting balance, decreased standing balanced, decreased safety awareness, and decreased knowledge of AE/AD impacting participation in ADLs. Patient would continue to benefit from skilled OT services at this time while admitted and after d/c to address noted deficits in order to improve overall safety and independence in ADLs.        Recommendations for follow up therapy are one component of a multi-disciplinary discharge planning process, led by the attending physician.  Recommendations may be updated based on patient status, additional functional criteria and insurance authorization.   Follow Up Recommendations  Skilled nursing-short term rehab (<3 hours/day)    Assistance Recommended at Discharge Frequent or constant Supervision/Assistance  Patient can return home with the following A lot of help with bathing/dressing/bathroom;Two people to help with walking and/or transfers;Direct supervision/assist for financial management;Direct supervision/assist for medications management;Assist for transportation;Help with stairs or ramp for entrance;Assistance with cooking/housework    Functional Status  Assessment  Patient has had a recent decline in their functional status and demonstrates the ability to make significant improvements in function in a reasonable and predictable amount of time.  Equipment Recommendations       Recommendations for Other Services       Precautions / Restrictions Precautions Precautions: Fall Precaution Comments: monitor O2 Restrictions Weight Bearing Restrictions: No      Mobility Bed Mobility Overal bed mobility: Needs Assistance Bed Mobility: Supine to Sit     Supine to sit: Max assist     General bed mobility comments: with cues for log rolling to reduce back pain with increased time.    Transfers                          Balance Overall balance assessment: Needs assistance, History of Falls Sitting-balance support: Bilateral upper extremity supported, No upper extremity supported Sitting balance-Leahy Scale: Poor Sitting balance - Comments: patient reported fear of falling                                   ADL either performed or assessed with clinical judgement   ADL Overall ADL's : Needs assistance/impaired Eating/Feeding: Set up;Bed level   Grooming: Bed level;Minimal assistance;Wash/dry face;Wash/dry hands   Upper Body Bathing: Minimal assistance;Bed level   Lower Body Bathing: Bed level;Total assistance   Upper Body Dressing : Bed level;Minimal assistance   Lower Body Dressing: Total assistance;Bed level Lower Body Dressing Details (indicate cue type and reason): unable to bring BLE to knees to don socks.   Toilet Transfer Details (indicate cue type and reason): patient was able to sit on edge of  stretcher in ED with increased time with noted incontinence episodeon back and bottom. nursing called into to assist with cleaning. Toileting- Clothing Manipulation and Hygiene: Total assistance;Bed level Toileting - Clothing Manipulation Details (indicate cue type and reason): min A to maintain  stidelying with mod A for rolling to each side in bed. max A for supine to sit on edge of bed.             Vision Patient Visual Report: No change from baseline       Perception     Praxis      Pertinent Vitals/Pain Pain Assessment Pain Assessment: Faces Faces Pain Scale: Hurts even more Pain Location: back     Hand Dominance Right   Extremity/Trunk Assessment Upper Extremity Assessment Upper Extremity Assessment: RUE deficits/detail RUE Deficits / Details: patient resistive to movement of RUE with h/o humeral fx.   Lower Extremity Assessment Lower Extremity Assessment: Defer to PT evaluation       Communication Communication Communication: No difficulties   Cognition Arousal/Alertness: Awake/alert Behavior During Therapy: WFL for tasks assessed/performed Overall Cognitive Status: Difficult to assess                                       General Comments       Exercises     Shoulder Instructions      Home Living Family/patient expects to be discharged to:: Private residence Living Arrangements: Spouse/significant other Available Help at Discharge: Family Type of Home: House Home Access: Stairs to enter Technical brewer of Steps: 2 Entrance Stairs-Rails: None Home Layout: One level     Bathroom Shower/Tub: Walk-in shower         Home Equipment: Conservation officer, nature (2 wheels)          Prior Functioning/Environment Prior Level of Function : Independent/Modified Independent                        OT Problem List: Decreased activity tolerance;Impaired balance (sitting and/or standing);Decreased knowledge of use of DME or AE;Decreased knowledge of precautions;Decreased safety awareness;Cardiopulmonary status limiting activity;Decreased cognition;Impaired UE functional use;Pain      OT Treatment/Interventions: Self-care/ADL training;Therapeutic exercise;Therapeutic activities;Neuromuscular education;Energy  conservation;DME and/or AE instruction;Balance training;Patient/family education    OT Goals(Current goals can be found in the care plan section) Acute Rehab OT Goals Patient Stated Goal: to get home OT Goal Formulation: With patient Time For Goal Achievement: 04/13/22 Potential to Achieve Goals: Fair  OT Frequency: Min 2X/week    Co-evaluation              AM-PAC OT "6 Clicks" Daily Activity     Outcome Measure Help from another person eating meals?: A Little Help from another person taking care of personal grooming?: A Little Help from another person toileting, which includes using toliet, bedpan, or urinal?: Total Help from another person bathing (including washing, rinsing, drying)?: Total Help from another person to put on and taking off regular upper body clothing?: A Lot Help from another person to put on and taking off regular lower body clothing?: A Lot 6 Click Score: 12   End of Session Equipment Utilized During Treatment: Oxygen Nurse Communication: Mobility status  Activity Tolerance: Patient limited by fatigue Patient left: in bed;with call bell/phone within reach;with nursing/sitter in room  OT Visit Diagnosis: Unsteadiness on feet (R26.81);Other abnormalities of gait and mobility (R26.89);Muscle weakness (generalized) (M62.81);History  of falling (Z91.81)                Time: 7782-4235 OT Time Calculation (min): 29 min Charges:  OT General Charges $OT Visit: 1 Visit OT Evaluation $OT Eval Moderate Complexity: 1 Mod OT Treatments $Self Care/Home Management : 8-22 mins  Rennie Plowman, MS Acute Rehabilitation Department Office# [phone]   Ronnie Snyder 03/30/2022, 1:50 PM

## 2022-03-30 NOTE — ED Notes (Signed)
New foley has been inserted successfully

## 2022-03-31 DIAGNOSIS — N390 Urinary tract infection, site not specified: Secondary | ICD-10-CM | POA: Diagnosis not present

## 2022-03-31 DIAGNOSIS — E43 Unspecified severe protein-calorie malnutrition: Secondary | ICD-10-CM | POA: Diagnosis not present

## 2022-03-31 DIAGNOSIS — J9611 Chronic respiratory failure with hypoxia: Secondary | ICD-10-CM | POA: Diagnosis not present

## 2022-03-31 MED ORDER — VANCOMYCIN HCL IN DEXTROSE 1-5 GM/200ML-% IV SOLN
1000.0000 mg | Freq: Once | INTRAVENOUS | Status: AC
  Administered 2022-03-31: 1000 mg via INTRAVENOUS
  Filled 2022-03-31: qty 200

## 2022-03-31 MED ORDER — VANCOMYCIN HCL 750 MG/150ML IV SOLN
750.0000 mg | INTRAVENOUS | Status: DC
  Administered 2022-04-01 – 2022-04-02 (×2): 750 mg via INTRAVENOUS
  Filled 2022-03-31 (×2): qty 150

## 2022-03-31 MED ORDER — ENOXAPARIN SODIUM 40 MG/0.4ML IJ SOSY
40.0000 mg | PREFILLED_SYRINGE | INTRAMUSCULAR | Status: DC
  Administered 2022-03-31 – 2022-04-02 (×3): 40 mg via SUBCUTANEOUS
  Filled 2022-03-31 (×3): qty 0.4

## 2022-03-31 NOTE — Social Work (Signed)
PT will still need to see the Pt. TOC will continue to follow.

## 2022-03-31 NOTE — Progress Notes (Signed)
CSW spoke to Pt's wife made her aware of the process. CSW did FL2 sent out to facilities. TOC ill continue to follow for bed offers.

## 2022-03-31 NOTE — Progress Notes (Signed)
Pharmacy Antibiotic Note  Ronnie Snyder is a 81 y.o. male with hx urinary retention and chronic foley cath who presented to the ED on 03/29/2022 for evaluation of generalized weakness and dark urine noted in foley cath. He was started on ceftriaxone on admission for UTI.  UCX collected on 11/10 now has enterococcus faecalis and staph saprophyticus (suscept. pending). Pharmacy has been consulted to dose vancomycin for infection.  Plan: - vancomycin 1000 mg IV x1, then 750 mg q24h for est AUC 437 - ceftriaxone d/ced per MD _____________________________________  Height: '5\' 7"'$  (170.2 cm) Weight: 50 kg (110 lb 3.7 oz) IBW/kg (Calculated) : 66.1  Temp (24hrs), Avg:98.3 F (36.8 C), Min:98 F (36.7 C), Max:98.5 F (36.9 C)  Recent Labs  Lab 03/29/22 0937  WBC 6.1  CREATININE 0.36*    Estimated Creatinine Clearance: 52.1 mL/min (A) (by C-G formula based on SCr of 0.36 mg/dL (L)).    Allergies  Allergen Reactions   Hydrocodone-Acetaminophen Nausea Only and Other (See Comments)    Vertigo, also   Atorvastatin Nausea Only   Other Nausea Only and Other (See Comments)    Vertigo and nausea: "ALL PAINS/NARCOTIC MEDS-CANNOT TOLERATE WELL,"  per Med History prior to 05/23/17   Penicillins Rash    Has patient had a PCN reaction causing immediate rash, facial/tongue/throat swelling, SOB or lightheadedness with hypotension: Unknown Has patient had a PCN reaction causing severe rash involving mucus membranes or skin necrosis: No Has patient had a PCN reaction that required hospitalization: No Has patient had a PCN reaction occurring within the last 10 years: No If all of the above answers are "NO", then may proceed with Cephalosporin use.    Pioglitazone Nausea Only    Thank you for allowing pharmacy to be a part of this patient's care.  Lynelle Doctor 03/31/2022 8:08 AM

## 2022-03-31 NOTE — NC FL2 (Signed)
[place] LEVEL OF CARE SCREENING TOOL     IDENTIFICATION  Patient Name: Ronnie Snyder Birthdate: 1940-07-05 Sex: male Admission Date (Current Location): 03/29/2022  [facility] and [place] Number:  Herbalist and Address:  [facility],  [address], [place]      Provider Number: [id number]  Attending Physician Name and Address:  Barb Merino, MD  Relative Name and Phone Number:  Geraldo Pitter [phone]    Current Level of Care: Hospital Recommended Level of Care: [place] Prior Approval Number:    Date Approved/Denied: 03/31/22 PASRR Number: [id number]  Discharge Plan: SNF    Current Diagnoses: Patient Active Problem List   Diagnosis Date Noted   UTI (urinary tract infection) 03/29/2022   Protein-calorie malnutrition ([place]) 02/17/2022   Aortic stenosis 11/28/2021   Pressure injury of skin 11/26/2021   Acute respiratory failure with hypoxia and hypercapnia ([place]) 11/25/2021   Lumbar compression fracture ([place]) 05/02/2021   Weight loss 01/15/2021   Fatigue 05/30/2020   Benign neoplasm of colon    Gastric AVM    Iron deficiency anemia due to chronic blood loss 11/09/2019   Barrett's esophagus without dysplasia 08/30/2019   Steatosis of liver 08/19/2019   GI bleed 08/18/2019   Cerebral thrombosis with cerebral infarction 08/12/2019   Symptomatic anemia 08/09/2019   Chronic respiratory failure with hypoxia ([place]) 08/09/2019   Urinary hesitancy 10/26/2018   Night sweats 10/26/2018   Other specified hypothyroidism 05/29/2018   Lumbar stenosis 05/23/2017   Senile purpura ([place]) 09/23/2013   PAD (peripheral artery disease) ([place]) 08/09/2013   Aftercare following surgery of the circulatory system, NEC 02/01/2013   Hyponatremia 08/21/2012   Smoker 08/13/2012   Other and unspecified alcohol dependence, unspecified drinking behavior 08/13/2012   Screening for prostate cancer 08/13/2012   Antiplatelet  or antithrombotic long-term use 08/13/2012   Atherosclerosis of native artery of extremity with intermittent claudication ([place]) 08/21/2011   ECZEMA 01/24/2010   COUGH 01/24/2009   AAA (abdominal aortic aneurysm) ([place]) 12/06/2008   HYPERCHOLESTEROLEMIA 01/21/2008   Back pain 01/21/2008   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   Anxiety state 03/07/2007   DEPRESSION 03/07/2007   HTN (hypertension) 03/07/2007   CAROTID ARTERY STENOSIS 03/07/2007   HIP PAIN, RIGHT 03/07/2007   COLONIC POLYPS, HX OF 03/07/2007   ADENOIDECTOMY, HX OF 03/07/2007    Orientation RESPIRATION BLADDER Height & Weight     Self, Time, Situation, Place  O2 Continent Weight: 110 lb 3.7 oz (50 kg) Height:  '5\' 7"'$  (170.2 cm)  BEHAVIORAL SYMPTOMS/MOOD NEUROLOGICAL BOWEL NUTRITION STATUS      Continent    AMBULATORY STATUS COMMUNICATION OF NEEDS Skin   Limited Assist   Normal                       Personal Care Assistance Level of Assistance  Bathing, Feeding, Dressing Bathing Assistance: Limited assistance Feeding assistance: Limited assistance Dressing Assistance: Limited assistance     Functional Limitations Info  Sight, Hearing, Speech Sight Info: Adequate Hearing Info: Adequate Speech Info: Adequate    SPECIAL CARE FACTORS FREQUENCY                       Contractures Contractures Info: Not present    Additional Factors Info  Code Status, Allergies Code Status Info: Full Allergies Info: Hydrocodone-acetaminophen Medium Intolerance Nausea Only, Other (See Comments) Vertigo, also Atorvastatin Low Intolerance Nausea Only  Other Low Intolerance Nausea Only, Other (  See Comments) Vertigo and nausea: "ALL PAINS/NARCOTIC MEDS-CANNOT TOLERATE WELL,"  per Med History prior to 05/23/17 Penicillins           Current Medications (03/31/2022):  This is the current hospital active medication list Current Facility-Administered Medications  Medication Dose Route Frequency Provider Last Rate Last Admin    acetaminophen (TYLENOL) tablet 1,000 mg  1,000 mg Oral BID Barb Merino, MD   1,000 mg at 03/31/22 0934   aspirin EC tablet 81 mg  81 mg Oral Daily Barb Merino, MD   81 mg at 03/31/22 0934   bethanechol (URECHOLINE) tablet 5 mg  5 mg Oral TID Barb Merino, MD   5 mg at 03/31/22 0935   docusate sodium (COLACE) capsule 100 mg  100 mg Oral BID PRN Barb Merino, MD       enoxaparin (LOVENOX) injection 40 mg  40 mg Subcutaneous Q24H Pham, Anh P, RPH   40 mg at 03/31/22 0935   ferrous sulfate tablet 325 mg  325 mg Oral Q breakfast Barb Merino, MD   325 mg at 09/62/83 6629   folic acid (FOLVITE) tablet 1 mg  1 mg Oral q morning Barb Merino, MD   1 mg at 03/31/22 0934   hydrochlorothiazide (HYDRODIURIL) tablet 25 mg  25 mg Oral Daily Barb Merino, MD   25 mg at 03/31/22 0934   ipratropium-albuterol (DUONEB) 0.5-2.5 (3) MG/3ML nebulizer solution 3 mL  3 mL Nebulization Q4H PRN Barb Merino, MD   3 mL at 03/30/22 2155   levothyroxine (SYNTHROID) tablet 75 mcg  75 mcg Oral Q0600 Barb Merino, MD   75 mcg at 03/31/22 0640   pantoprazole (PROTONIX) EC tablet 40 mg  40 mg Oral Daily Barb Merino, MD   40 mg at 03/31/22 0934   polyethylene glycol (MIRALAX / GLYCOLAX) packet 17 g  17 g Oral Daily PRN Barb Merino, MD       rosuvastatin (CRESTOR) tablet 20 mg  20 mg Oral Daily Ghimire, Dante Gang, MD       tamsulosin (FLOMAX) capsule 0.4 mg  0.4 mg Oral Daily Barb Merino, MD   0.4 mg at 03/31/22 0934   traZODone (DESYREL) tablet 25 mg  25 mg Oral QHS Barb Merino, MD   25 mg at 03/30/22 2140   [START ON 04/01/2022] vancomycin (VANCOREADY) IVPB 750 mg/150 mL  750 mg Intravenous Q24H Lynelle Doctor, RPH         Discharge Medications: Please see discharge summary for a list of discharge medications.  Relevant Imaging Results:  Relevant Lab Results:   Additional Information [place] Timberly Yott LCSW-A  [place], [place]

## 2022-03-31 NOTE — Evaluation (Signed)
Physical Therapy Evaluation Patient Details Name: Ronnie Snyder MRN: [id number] DOB: [birth year] Today's Date: 03/31/2022  History of Present Illness  Patient is a 81 year old male who presented to the ED via EMS with c/o weakness and AMS and dx with UTI.   PMH: anemia, HTN, stroke, HLD, GERD, severe ortic stenosis, R proximal humerus fx.  Clinical Impression  Pt admitted as above and presenting  with functional mobility limitations 2* generalized weakness, balance deficits and poor endurance.  Pt would benefit from follow up rehab at SNF level to maximize IND and safety prior to dc home with limited assist.     Recommendations for follow up therapy are one component of a multi-disciplinary discharge planning process, led by the attending physician.  Recommendations may be updated based on patient status, additional functional criteria and insurance authorization.  Follow Up Recommendations Skilled nursing-short term rehab (<3 hours/day) Can patient physically be transported by private vehicle: No    Assistance Recommended at Discharge Frequent or constant Supervision/Assistance  Patient can return home with the following  A lot of help with walking and/or transfers;A little help with bathing/dressing/bathroom;Assistance with cooking/housework;Assist for transportation;Help with stairs or ramp for entrance    Equipment Recommendations None recommended by PT  Recommendations for Other Services       Functional Status Assessment Patient has had a recent decline in their functional status and demonstrates the ability to make significant improvements in function in a reasonable and predictable amount of time.     Precautions / Restrictions Precautions Precautions: Fall Precaution Comments: monitor O2 Restrictions Weight Bearing Restrictions: No      Mobility  Bed Mobility Overal bed mobility: Needs Assistance Bed Mobility: Rolling, Sidelying to Sit Rolling: Min assist, Mod  assist Sidelying to sit: Mod assist       General bed mobility comments: with cues for log rolling and increased time.    Transfers Overall transfer level: Needs assistance   Transfers: Sit to/from Stand Sit to Stand: Min assist, Mod assist           General transfer comment: cues for LE management and use of UEs to self assist    Ambulation/Gait Ambulation/Gait assistance: Min assist, +2 safety/equipment, +2 physical assistance Gait Distance (Feet): 5 Feet Assistive device: Rolling walker (2 wheels) Gait Pattern/deviations: Step-to pattern, Decreased step length - right, Decreased step length - left, Shuffle, Trunk flexed Gait velocity: decr     General Gait Details: cues for posture and position from [employer]            Wheelchair Mobility    Modified Rankin (Stroke Patients Only)       Balance Overall balance assessment: Needs assistance, History of Falls Sitting-balance support: No upper extremity supported, Feet supported Sitting balance-Leahy Scale: Fair     Standing balance support: Bilateral upper extremity supported Standing balance-Leahy Scale: Poor                               Pertinent Vitals/Pain Pain Assessment Pain Assessment: No/denies pain    Home Living Family/patient expects to be discharged to:: Skilled nursing facility Living Arrangements: Spouse/significant other Available Help at Discharge: Family Type of Home: House Home Access: Stairs to enter Entrance Stairs-Rails: None Entrance Stairs-Number of Steps: 2   Home Layout: One level Home Equipment: Conservation officer, nature (2 wheels);Kasandra Knudsen - single point Additional Comments: pt reports he lives with wife and is her primary caregiver  Prior Function Prior Level of Function : Independent/Modified Independent             Mobility Comments: walked with a cane ADLs Comments: independent     Hand Dominance   Dominant Hand: Right    Extremity/Trunk  Assessment   Upper Extremity Assessment Upper Extremity Assessment: RUE deficits/detail;Defer to OT evaluation    Lower Extremity Assessment Lower Extremity Assessment: Generalized weakness    Cervical / Trunk Assessment Cervical / Trunk Assessment: Normal  Communication   Communication: No difficulties  Cognition Arousal/Alertness: Awake/alert Behavior During Therapy: WFL for tasks assessed/performed, Impulsive Overall Cognitive Status: No family/caregiver present to determine baseline cognitive functioning                                          General Comments      Exercises     Assessment/Plan    PT Assessment Patient needs continued PT services  PT Problem List Decreased strength;Decreased range of motion;Decreased activity tolerance;Decreased balance;Decreased mobility;Decreased knowledge of use of DME;Decreased safety awareness       PT Treatment Interventions DME instruction;Gait training;Stair training;Functional mobility training;Therapeutic activities;Therapeutic exercise;Patient/family education    PT Goals (Current goals can be found in the Care Plan section)  Acute Rehab PT Goals Patient Stated Goal: Regain strength/IND PT Goal Formulation: With patient Time For Goal Achievement: 04/14/22 Potential to Achieve Goals: Fair    Frequency Min 3X/week     Co-evaluation               AM-PAC PT "6 Clicks" Mobility  Outcome Measure Help needed turning from your back to your side while in a flat bed without using bedrails?: A Little Help needed moving from lying on your back to sitting on the side of a flat bed without using bedrails?: A Lot Help needed moving to and from a bed to a chair (including a wheelchair)?: A Lot Help needed standing up from a chair using your arms (e.g., wheelchair or bedside chair)?: A Lot Help needed to walk in hospital room?: Total Help needed climbing 3-5 steps with a railing? : Total 6 Click Score:  11    End of Session Equipment Utilized During Treatment: Gait belt Activity Tolerance: Patient tolerated treatment well Patient left: in chair;with call bell/phone within reach;with chair alarm set;with nursing/sitter in room Nurse Communication: Mobility status PT Visit Diagnosis: Unsteadiness on feet (R26.81);Muscle weakness (generalized) (M62.81);Difficulty in walking, not elsewhere classified (R26.2)    Time: 2025-4270 PT Time Calculation (min) (ACUTE ONLY): 27 min   Charges:   PT Evaluation $PT Eval Low Complexity: 1 Low PT Treatments $Therapeutic Activity: 8-22 mins        Debe Coder PT Acute Rehabilitation Services Pager [phone] Office [phone]   Elohim Brune 03/31/2022, 1:02 PM

## 2022-03-31 NOTE — Progress Notes (Signed)
CSW spoke to the son they would prefer [facility], CSW did let the son know the process of the bed offers he is aware, CSW also made him aware that his dad was accepted into [facility] and needed to look into that facility. TOC will continue to follow up tomorrow.

## 2022-03-31 NOTE — Progress Notes (Signed)
PROGRESS NOTE    Ronnie Snyder  BVQ:[id number] DOB: 03/03/1941 DOA: 03/29/2022 PCP: Luetta Nutting, DO    Brief Narrative:  81 year old gentleman with history of COPD and chronic hypoxemia on 2 L oxygen at home, GERD, urine retention with Foley catheter, generalized debility brought to the emergency room by family members due to him not getting out of the bed, unable to take care of self and noted dark urine in the catheter.  In the emergency room urine was grossly abnormal that was taken from indwelling Foley catheter.  Patient was hemodynamic stable.  Could not get out of the bed.  So admitted to the hospital.   Assessment & Plan:   Acute UTI present on admission secondary to indwelling Foley catheter:  Receiving Rocephin, however urine culture is growing Enterococcus faecalis and Staphylococcus.  Discontinue Rocephin.  Start vancomycin pending final cultures.  Previous Enterococcus faecalis sensitive to ampicillin, however patient is allergy to penicillin.  Will await final cultures.   Discussed with family that catheter was placed because of immobility, discontinue Foley catheter today and give a voiding trial.  Continue Flomax.    Debility and deconditioning: Profound physical deconditioning.  No adequate support system at home.  Will need more rehab.  Severe protein calorie malnutrition: Nutrition to see.  Chronic hypoxemia with COPD: Not in exacerbation.  Bronchodilators.  Oxygen to keep saturation more than 90%.  Hypertension: Stable. Hyperlipidemia: Stable.    DVT prophylaxis: enoxaparin (LOVENOX) injection 40 mg Start: 03/31/22 1000   Code Status: Full code Family Communication: Daughter-in-law on the phone. Disposition Plan: Status is: Inpatient.  Unsafe disposition.  IV antibiotics.   Consultants:  None  Procedures:  None  Antimicrobials:  Rocephin 11/10--- 11/12 Vancomycin 11/12---   Subjective: Patient is examined.  Mostly sleepy.  Impulsive on  waking up.  Denies any complaints.  Family reported he was confused and impulsive.  Objective: Vitals:   03/30/22 2155 03/31/22 0253 03/31/22 0643 03/31/22 0824  BP:  (!) 179/73 (!) 153/103 (!) 161/89  Pulse:  73 90 80  Resp:  20 16 (!) 22  Temp:  98.1 F (36.7 C) 98.5 F (36.9 C) 97.7 F (36.5 C)  TempSrc:  Oral  Oral  SpO2: 92% 93% 98% 100%  Weight:      Height:        Intake/Output Summary (Last 24 hours) at 03/31/2022 1103 Last data filed at 03/31/2022 0600 Gross per 24 hour  Intake 1611.73 ml  Output 1000 ml  Net 611.73 ml   Filed Weights   03/29/22 0910  Weight: 50 kg    Examination:  General exam: Appears calm and comfortable  Frail and debilitated. Respiratory system: Clear to auscultation. Respiratory effort normal.  No added sounds. Cardiovascular system: S1 & S2 heard, RRR.  Gastrointestinal system: Soft.  Nontender.  Bowel sound present. Foley catheter with clear urine. Central nervous system: Alert and oriented x2-3.  Irritable on interview.  No focal deficits.    Data Reviewed: I have personally reviewed following labs and imaging studies  CBC: Recent Labs  Lab 03/29/22 0937  WBC 6.1  NEUTROABS 4.7  HGB 10.1*  HCT 32.2*  MCV 102.5*  PLT 800   Basic Metabolic Panel: Recent Labs  Lab 03/29/22 0937  NA 134*  K 3.4*  CL 82*  CO2 44*  GLUCOSE 109*  BUN 24*  CREATININE 0.36*  CALCIUM 9.2   GFR: Estimated Creatinine Clearance: 52.1 mL/min (A) (by C-G formula based on SCr of 0.36 mg/dL (  L)). Liver Function Tests: No results for input(s): "AST", "ALT", "ALKPHOS", "BILITOT", "PROT", "ALBUMIN" in the last 168 hours. No results for input(s): "LIPASE", "AMYLASE" in the last 168 hours. No results for input(s): "AMMONIA" in the last 168 hours. Coagulation Profile: No results for input(s): "INR", "PROTIME" in the last 168 hours. Cardiac Enzymes: No results for input(s): "CKTOTAL", "CKMB", "CKMBINDEX", "TROPONINI" in the last 168 hours. BNP  (last 3 results) No results for input(s): "PROBNP" in the last 8760 hours. HbA1C: No results for input(s): "HGBA1C" in the last 72 hours. CBG: Recent Labs  Lab 03/30/22 0814  GLUCAP 73   Lipid Profile: No results for input(s): "CHOL", "HDL", "LDLCALC", "TRIG", "CHOLHDL", "LDLDIRECT" in the last 72 hours. Thyroid Function Tests: No results for input(s): "TSH", "T4TOTAL", "FREET4", "T3FREE", "THYROIDAB" in the last 72 hours. Anemia Panel: No results for input(s): "VITAMINB12", "FOLATE", "FERRITIN", "TIBC", "IRON", "RETICCTPCT" in the last 72 hours. Sepsis Labs: No results for input(s): "PROCALCITON", "LATICACIDVEN" in the last 168 hours.  Recent Results (from the past 240 hour(s))  Urine Culture     Status: Abnormal (Preliminary result)   Collection Time: 03/29/22 12:29 PM   Specimen: Urine, Catheterized  Result Value Ref Range Status   Specimen Description   Final    URINE, CATHETERIZED Performed at [facility] [place] [address], [place], [place] [address]    Special Requests   Final    NONE Performed at [facility], [place] [address] [place], [place] [address]    Culture (A)  Final    >=100,000 COLONIES/mL ENTEROCOCCUS FAECALIS >=100,000 COLONIES/mL STAPHYLOCOCCUS SAPROPHYTICUS SUSCEPTIBILITIES TO FOLLOW Performed at [facility], [place] [address] [place], [place] [address]    Report Status PENDING  Incomplete  Resp Panel by RT-PCR (Flu A&B, Covid) Anterior Nasal Swab     Status: None   Collection Time: 03/29/22  2:11 PM   Specimen: Anterior Nasal Swab  Result Value Ref Range Status   SARS Coronavirus 2 by RT PCR NEGATIVE NEGATIVE Final    Comment: (NOTE) SARS-CoV-2 target nucleic acids are NOT DETECTED.  The SARS-CoV-2 RNA is generally detectable in upper respiratory specimens during the acute phase of infection. The lowest concentration of SARS-CoV-2 viral copies this assay can detect is 138 copies/mL. A negative result does  not preclude SARS-Cov-2 infection and should not be used as the sole basis for treatment or other patient management decisions. A negative result may occur with  improper specimen collection/handling, submission of specimen other than nasopharyngeal swab, presence of viral mutation(s) within the areas targeted by this assay, and inadequate number of viral copies(<138 copies/mL). A negative result must be combined with clinical observations, patient history, and epidemiological information. The expected result is Negative.  Fact Sheet for Patients:  EntrepreneurPulse.com.au  Fact Sheet for Healthcare Providers:  IncredibleEmployment.be  This test is no t yet approved or cleared by the [place] FDA and  has been authorized for detection and/or diagnosis of SARS-CoV-2 by FDA under an Emergency Use Authorization (EUA). This EUA will remain  in effect (meaning this test can be used) for the duration of the COVID-19 declaration under Section 564(b)(1) of the Act, 21 U.S.C.section 360bbb-3(b)(1), unless the authorization is terminated  or revoked sooner.       Influenza A by PCR NEGATIVE NEGATIVE Final   Influenza B by PCR NEGATIVE NEGATIVE Final    Comment: (NOTE) The Xpert Xpress SARS-CoV-2/FLU/RSV plus assay is intended as an aid in the diagnosis of influenza from  Nasopharyngeal swab specimens and should not be used as a sole basis for treatment. Nasal washings and aspirates are unacceptable for Xpert Xpress SARS-CoV-2/FLU/RSV testing.  Fact Sheet for Patients: EntrepreneurPulse.com.au  Fact Sheet for Healthcare Providers: IncredibleEmployment.be  This test is not yet approved or cleared by the [place] FDA and has been authorized for detection and/or diagnosis of SARS-CoV-2 by FDA under an Emergency Use Authorization (EUA). This EUA will remain in effect (meaning this test can be used) for the  duration of the COVID-19 declaration under Section 564(b)(1) of the Act, 21 U.S.C. section 360bbb-3(b)(1), unless the authorization is terminated or revoked.  Performed at [place], [place] [address], [place], [place] [address]          Radiology Studies: DG Chest Portable 1 View  Result Date: 03/29/2022 CLINICAL DATA:  Cough EXAM: PORTABLE CHEST 1 VIEW COMPARISON:  11/28/2021 FINDINGS: The patient is rotated to the right on today's radiograph, reducing diagnostic sensitivity and specificity. Substantial new airspace opacity in the right upper chest, thought to mostly be in the right upper lobe. Interval clearing of prior airspace opacity in the right lower lobe. Atherosclerotic calcification of the aortic arch. Heart size is within normal limits given the degree of rotation. Old left rib deformities compatible with healed fractures. Bony demineralization. IMPRESSION: 1. Substantial new airspace opacity in the right upper lobe, thought to mostly be in the right upper lobe, suspicious for pneumonia. 2. Interval clearing of prior airspace opacity in the right lower lobe. 3. Bony demineralization. Electronically Signed   By: Van Clines M.D.   On: 03/29/2022 14:42        Scheduled Meds:  acetaminophen  1,000 mg Oral BID   aspirin EC  81 mg Oral Daily   bethanechol  5 mg Oral TID   enoxaparin (LOVENOX) injection  40 mg Subcutaneous Q24H   ferrous sulfate  325 mg Oral Q breakfast   folic acid  1 mg Oral q morning   hydrochlorothiazide  25 mg Oral Daily   levothyroxine  75 mcg Oral Q0600   pantoprazole  40 mg Oral Daily   rosuvastatin  20 mg Oral Daily   tamsulosin  0.4 mg Oral Daily   traZODone  25 mg Oral QHS   Continuous Infusions:  [START ON 04/01/2022] vancomycin       LOS: 1 day    Time spent: 35 minutes    Barb Merino, MD [facility] Pager [phone]

## 2022-04-01 DIAGNOSIS — E43 Unspecified severe protein-calorie malnutrition: Secondary | ICD-10-CM | POA: Diagnosis not present

## 2022-04-01 DIAGNOSIS — N3000 Acute cystitis without hematuria: Secondary | ICD-10-CM

## 2022-04-01 LAB — BASIC METABOLIC PANEL
Anion gap: 7 (ref 5–15)
BUN: 10 mg/dL (ref 8–23)
CO2: 37 mmol/L — ABNORMAL HIGH (ref 22–32)
Calcium: 8.6 mg/dL — ABNORMAL LOW (ref 8.9–10.3)
Chloride: 88 mmol/L — ABNORMAL LOW (ref 98–111)
Creatinine, Ser: 0.42 mg/dL — ABNORMAL LOW (ref 0.61–1.24)
GFR, Estimated: >60 mL/min (ref >60)
Glucose, Bld: 108 mg/dL — ABNORMAL HIGH (ref 70–99)
Potassium: 3.1 mmol/L — ABNORMAL LOW (ref 3.5–5.1)
Sodium: 132 mmol/L — ABNORMAL LOW (ref 135–145)

## 2022-04-01 LAB — CBC WITH DIFFERENTIAL/PLATELET
Abs Immature Granulocytes: 0.13 10*3/uL — ABNORMAL HIGH (ref 0.00–0.07)
Basophils Absolute: 0 10*3/uL (ref 0.0–0.1)
Basophils Relative: 0 %
Eosinophils Absolute: 0.1 10*3/uL (ref 0.0–0.5)
Eosinophils Relative: 1 %
HCT: 29 % — ABNORMAL LOW (ref 39.0–52.0)
Hemoglobin: 9.2 g/dL — ABNORMAL LOW (ref 13.0–17.0)
Immature Granulocytes: 2 %
Lymphocytes Relative: 4 %
Lymphs Abs: 0.2 10*3/uL — ABNORMAL LOW (ref 0.7–4.0)
MCH: 32.4 pg (ref 26.0–34.0)
MCHC: 31.7 g/dL (ref 30.0–36.0)
MCV: 102.1 fL — ABNORMAL HIGH (ref 80.0–100.0)
Monocytes Absolute: 0.7 10*3/uL (ref 0.1–1.0)
Monocytes Relative: 12 %
Neutro Abs: 4.6 10*3/uL (ref 1.7–7.7)
Neutrophils Relative %: 81 %
Platelets: 162 10*3/uL (ref 150–400)
RBC: 2.84 MIL/uL — ABNORMAL LOW (ref 4.22–5.81)
RDW: 16.5 % — ABNORMAL HIGH (ref 11.5–15.5)
WBC: 5.7 10*3/uL (ref 4.0–10.5)
nRBC: 0 % (ref 0.0–0.2)

## 2022-04-01 LAB — URINE CULTURE: Culture: >=100000 — AB

## 2022-04-01 LAB — MAGNESIUM: Magnesium: 1.5 mg/dL — ABNORMAL LOW (ref 1.7–2.4)

## 2022-04-01 LAB — PHOSPHORUS: Phosphorus: 3.4 mg/dL (ref 2.5–4.6)

## 2022-04-01 MED ORDER — MAGNESIUM SULFATE 4 GM/100ML IV SOLN
4.0000 g | Freq: Once | INTRAVENOUS | Status: AC
  Administered 2022-04-01: 4 g via INTRAVENOUS
  Filled 2022-04-01: qty 100

## 2022-04-01 MED ORDER — POTASSIUM CHLORIDE CRYS ER 20 MEQ PO TBCR
40.0000 meq | EXTENDED_RELEASE_TABLET | Freq: Two times a day (BID) | ORAL | Status: DC
  Administered 2022-04-01 – 2022-04-02 (×3): 40 meq via ORAL
  Filled 2022-04-01 (×3): qty 2

## 2022-04-01 NOTE — Progress Notes (Signed)
PROGRESS NOTE    Ronnie Snyder  RDE:[id number] DOB: 08-21-1940 DOA: 03/29/2022 PCP: Luetta Nutting, DO    Brief Narrative:  81 year old gentleman with history of COPD and chronic hypoxemia on 2 L oxygen at home, GERD, urine retention with Foley catheter, generalized debility brought to the emergency room by family members due to him not getting out of the bed, unable to take care of self and noted dark urine in the catheter.  In the emergency room urine was grossly abnormal that was taken from indwelling Foley catheter.  Patient was hemodynamic stable.  Could not get out of the bed.  So admitted to the hospital.   Assessment & Plan:   Acute UTI present on admission secondary to indwelling Foley catheter:  Urine culture with Enterococcus faecalis and Staphylococcus saprophyticus.  Clinically improving.   Initially treated with Rocephin.  Currently remains on vancomycin.   Foley catheter was exchanged, voiding trial was given but unsuccessful.  Reinsert Foley catheter today.  Continue Flomax.   We will discharge with Foley, he will follow-up with his urologist.  On discharge, will change to oral antibiotics for 2 weeks.    Debility and deconditioning: Profound physical deconditioning.  No adequate support system at home.  Will need more rehab.  Severe protein calorie malnutrition: Nutrition to see.  Chronic hypoxemia with COPD: Not in exacerbation.  Bronchodilators.  Oxygen to keep saturation more than 90%.  Hypomagnesemia: Replace IV.  Will discharge with oral replacement.  Hypertension: Stable. Hyperlipidemia: Stable.  Delirium: Improving.  DVT prophylaxis: enoxaparin (LOVENOX) injection 40 mg Start: 03/31/22 1000   Code Status: Full code Family Communication: Wife and son at the bedside.  Wife agreed to discuss with palliative care about more resources care coordination. Disposition Plan: Status is: Inpatient.  Stable to discharge to SNF.   Consultants:  Palliative  care.  Procedures:  None  Antimicrobials:  Rocephin 11/10--- 11/12 Vancomycin 11/12---   Subjective: Seen and examined.  More alert today.  Was eating breakfast.  Most of the time he talks normally.  Afebrile. He retained, catheter needed to be reinserted.  Objective: Vitals:   03/31/22 2011 03/31/22 2017 03/31/22 2149 04/01/22 0451  BP: 121/64   (!) 166/72  Pulse: 86  89 77  Resp: 20   18  Temp: 99.4 F (37.4 C)   98.8 F (37.1 C)  TempSrc: Oral   Oral  SpO2: (!) 87% 91% 97% 93%  Weight:      Height:        Intake/Output Summary (Last 24 hours) at 04/01/2022 1129 Last data filed at 04/01/2022 0900 Gross per 24 hour  Intake 630 ml  Output 900 ml  Net -270 ml   Filed Weights   03/29/22 0910  Weight: 50 kg    Examination:  General exam: Appears calm and comfortable  Frail and debilitated.  Cachectic. Respiratory system: Clear to auscultation. Respiratory effort normal.  No added sounds. Cardiovascular system: S1 & S2 heard, RRR.  Gastrointestinal system: Soft.  Nontender.  Bowel sound present. Foley catheter with clear urine. Central nervous system: Alert and oriented x2-3.  Quiet and calm.  No focal deficits.    Data Reviewed: I have personally reviewed following labs and imaging studies  CBC: Recent Labs  Lab 03/29/22 0937 04/01/22 0334  WBC 6.1 5.7  NEUTROABS 4.7 4.6  HGB 10.1* 9.2*  HCT 32.2* 29.0*  MCV 102.5* 102.1*  PLT 186 631   Basic Metabolic Panel: Recent Labs  Lab 03/29/22 0937 04/01/22  0334  NA 134* 132*  K 3.4* 3.1*  CL 82* 88*  CO2 44* 37*  GLUCOSE 109* 108*  BUN 24* 10  CREATININE 0.36* 0.42*  CALCIUM 9.2 8.6*  MG  --  1.5*  PHOS  --  3.4   GFR: Estimated Creatinine Clearance: 52.1 mL/min (A) (by C-G formula based on SCr of 0.42 mg/dL (L)). Liver Function Tests: No results for input(s): "AST", "ALT", "ALKPHOS", "BILITOT", "PROT", "ALBUMIN" in the last 168 hours. No results for input(s): "LIPASE", "AMYLASE" in the last  168 hours. No results for input(s): "AMMONIA" in the last 168 hours. Coagulation Profile: No results for input(s): "INR", "PROTIME" in the last 168 hours. Cardiac Enzymes: No results for input(s): "CKTOTAL", "CKMB", "CKMBINDEX", "TROPONINI" in the last 168 hours. BNP (last 3 results) No results for input(s): "PROBNP" in the last 8760 hours. HbA1C: No results for input(s): "HGBA1C" in the last 72 hours. CBG: Recent Labs  Lab 03/30/22 0814  GLUCAP 73   Lipid Profile: No results for input(s): "CHOL", "HDL", "LDLCALC", "TRIG", "CHOLHDL", "LDLDIRECT" in the last 72 hours. Thyroid Function Tests: No results for input(s): "TSH", "T4TOTAL", "FREET4", "T3FREE", "THYROIDAB" in the last 72 hours. Anemia Panel: No results for input(s): "VITAMINB12", "FOLATE", "FERRITIN", "TIBC", "IRON", "RETICCTPCT" in the last 72 hours. Sepsis Labs: No results for input(s): "PROCALCITON", "LATICACIDVEN" in the last 168 hours.  Recent Results (from the past 240 hour(s))  Urine Culture     Status: Abnormal   Collection Time: 03/29/22 12:29 PM   Specimen: Urine, Catheterized  Result Value Ref Range Status   Specimen Description   Final    URINE, CATHETERIZED Performed at [facility] [address], [place], [place] [address]    Special Requests   Final    NONE Performed at [facility], [place] [address] [place], [place] [address]    Culture (A)  Final    >=100,000 COLONIES/mL ENTEROCOCCUS FAECALIS >=100,000 COLONIES/mL STAPHYLOCOCCUS SAPROPHYTICUS    Report Status 04/01/2022 FINAL  Final   Organism ID, Bacteria ENTEROCOCCUS FAECALIS (A)  Final   Organism ID, Bacteria STAPHYLOCOCCUS SAPROPHYTICUS (A)  Final      Susceptibility   Staphylococcus saprophyticus - MIC*    CIPROFLOXACIN <=0.5 SENSITIVE Sensitive     GENTAMICIN <=0.5 SENSITIVE Sensitive     NITROFURANTOIN <=16 SENSITIVE Sensitive     OXACILLIN >=4 RESISTANT Resistant     TETRACYCLINE <=1 SENSITIVE  Sensitive     VANCOMYCIN <=0.5 SENSITIVE Sensitive     TRIMETH/SULFA <=10 SENSITIVE Sensitive     CLINDAMYCIN <=0.25 SENSITIVE Sensitive     RIFAMPIN <=0.5 SENSITIVE Sensitive     Inducible Clindamycin NEGATIVE Sensitive     * >=100,000 COLONIES/mL STAPHYLOCOCCUS SAPROPHYTICUS   Enterococcus faecalis - MIC*    AMPICILLIN <=2 SENSITIVE Sensitive     NITROFURANTOIN <=16 SENSITIVE Sensitive     VANCOMYCIN 1 SENSITIVE Sensitive     * >=100,000 COLONIES/mL ENTEROCOCCUS FAECALIS  Resp Panel by RT-PCR (Flu A&B, Covid) Anterior Nasal Swab     Status: None   Collection Time: 03/29/22  2:11 PM   Specimen: Anterior Nasal Swab  Result Value Ref Range Status   SARS Coronavirus 2 by RT PCR NEGATIVE NEGATIVE Final    Comment: (NOTE) SARS-CoV-2 target nucleic acids are NOT DETECTED.  The SARS-CoV-2 RNA is generally detectable in upper respiratory specimens during the acute phase of infection. The lowest concentration of SARS-CoV-2 viral copies this assay can detect is 138 copies/mL. A negative result does not  preclude SARS-Cov-2 infection and should not be used as the sole basis for treatment or other patient management decisions. A negative result may occur with  improper specimen collection/handling, submission of specimen other than nasopharyngeal swab, presence of viral mutation(s) within the areas targeted by this assay, and inadequate number of viral copies(<138 copies/mL). A negative result must be combined with clinical observations, patient history, and epidemiological information. The expected result is Negative.  Fact Sheet for Patients:  EntrepreneurPulse.com.au  Fact Sheet for Healthcare Providers:  IncredibleEmployment.be  This test is no t yet approved or cleared by the [place] FDA and  has been authorized for detection and/or diagnosis of SARS-CoV-2 by FDA under an Emergency Use Authorization (EUA). This EUA will remain  in effect  (meaning this test can be used) for the duration of the COVID-19 declaration under Section 564(b)(1) of the Act, 21 U.S.C.section 360bbb-3(b)(1), unless the authorization is terminated  or revoked sooner.       Influenza A by PCR NEGATIVE NEGATIVE Final   Influenza B by PCR NEGATIVE NEGATIVE Final    Comment: (NOTE) The Xpert Xpress SARS-CoV-2/FLU/RSV plus assay is intended as an aid in the diagnosis of influenza from Nasopharyngeal swab specimens and should not be used as a sole basis for treatment. Nasal washings and aspirates are unacceptable for Xpert Xpress SARS-CoV-2/FLU/RSV testing.  Fact Sheet for Patients: EntrepreneurPulse.com.au  Fact Sheet for Healthcare Providers: IncredibleEmployment.be  This test is not yet approved or cleared by the [place] FDA and has been authorized for detection and/or diagnosis of SARS-CoV-2 by FDA under an Emergency Use Authorization (EUA). This EUA will remain in effect (meaning this test can be used) for the duration of the COVID-19 declaration under Section 564(b)(1) of the Act, 21 U.S.C. section 360bbb-3(b)(1), unless the authorization is terminated or revoked.  Performed at [facility], [place] [address], [place], [place] [address]          Radiology Studies: No results found.      Scheduled Meds:  acetaminophen  1,000 mg Oral BID   aspirin EC  81 mg Oral Daily   bethanechol  5 mg Oral TID   enoxaparin (LOVENOX) injection  40 mg Subcutaneous Q24H   ferrous sulfate  325 mg Oral Q breakfast   folic acid  1 mg Oral q morning   hydrochlorothiazide  25 mg Oral Daily   levothyroxine  75 mcg Oral Q0600   pantoprazole  40 mg Oral Daily   potassium chloride  40 mEq Oral BID   rosuvastatin  20 mg Oral Daily   tamsulosin  0.4 mg Oral Daily   traZODone  25 mg Oral QHS   Continuous Infusions:  magnesium sulfate bolus IVPB 4 g (04/01/22 1029)   vancomycin 750 mg  (04/01/22 0909)     LOS: 2 days    Time spent: 35 minutes    Barb Merino, MD [facility] Pager [phone]

## 2022-04-01 NOTE — Progress Notes (Signed)
Attempted to intermittent catheterize x2 attempts and kept hitting resistance when trying to advance to bladder. Gave pt urinal and encouraged him to try to empty his bladder on his own. Will pass it along to the next nurse.

## 2022-04-01 NOTE — TOC Progression Note (Signed)
Transition of Care [facility]) - Progression Note    Patient Details  Name: Ronnie Snyder MRN: [id number] Date of Birth: [birth year]  Transition of Care [facility]) CM/SW Contact  Lennart Pall, LCSW Phone Number: 04/01/2022, 2:46 PM  Clinical Narrative:    Have reviewed SNF bed offers with pt/son/wife and they have accepted bed with [facility].  MD anticipates pt to be medically ready tomorrow.        Expected Discharge Plan and Services                                                 Social Determinants of Health (SDOH) Interventions    Readmission Risk Interventions    12/03/2021   12:27 PM  Readmission Risk Prevention Plan  Transportation Screening Complete  Home Care Screening Complete
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
## 2022-04-02 DIAGNOSIS — Z681 Body mass index (BMI) 19 or less, adult: Secondary | ICD-10-CM | POA: Diagnosis not present

## 2022-04-02 DIAGNOSIS — R0902 Hypoxemia: Secondary | ICD-10-CM | POA: Diagnosis not present

## 2022-04-02 DIAGNOSIS — Z9981 Dependence on supplemental oxygen: Secondary | ICD-10-CM | POA: Diagnosis not present

## 2022-04-02 DIAGNOSIS — Z95828 Presence of other vascular implants and grafts: Secondary | ICD-10-CM | POA: Diagnosis not present

## 2022-04-02 DIAGNOSIS — F17210 Nicotine dependence, cigarettes, uncomplicated: Secondary | ICD-10-CM | POA: Diagnosis not present

## 2022-04-02 DIAGNOSIS — I739 Peripheral vascular disease, unspecified: Secondary | ICD-10-CM | POA: Diagnosis not present

## 2022-04-02 DIAGNOSIS — J168 Pneumonia due to other specified infectious organisms: Secondary | ICD-10-CM | POA: Diagnosis not present

## 2022-04-02 DIAGNOSIS — G9340 Encephalopathy, unspecified: Secondary | ICD-10-CM | POA: Diagnosis not present

## 2022-04-02 DIAGNOSIS — J90 Pleural effusion, not elsewhere classified: Secondary | ICD-10-CM | POA: Diagnosis not present

## 2022-04-02 DIAGNOSIS — I10 Essential (primary) hypertension: Secondary | ICD-10-CM | POA: Diagnosis not present

## 2022-04-02 DIAGNOSIS — R0689 Other abnormalities of breathing: Secondary | ICD-10-CM | POA: Diagnosis not present

## 2022-04-02 DIAGNOSIS — I6330 Cerebral infarction due to thrombosis of unspecified cerebral artery: Secondary | ICD-10-CM | POA: Diagnosis not present

## 2022-04-02 DIAGNOSIS — A419 Sepsis, unspecified organism: Secondary | ICD-10-CM | POA: Diagnosis not present

## 2022-04-02 DIAGNOSIS — G9341 Metabolic encephalopathy: Secondary | ICD-10-CM | POA: Diagnosis not present

## 2022-04-02 DIAGNOSIS — J9602 Acute respiratory failure with hypercapnia: Secondary | ICD-10-CM | POA: Diagnosis not present

## 2022-04-02 DIAGNOSIS — J9622 Acute and chronic respiratory failure with hypercapnia: Secondary | ICD-10-CM | POA: Diagnosis not present

## 2022-04-02 DIAGNOSIS — E039 Hypothyroidism, unspecified: Secondary | ICD-10-CM | POA: Diagnosis not present

## 2022-04-02 DIAGNOSIS — J9621 Acute and chronic respiratory failure with hypoxia: Secondary | ICD-10-CM | POA: Diagnosis not present

## 2022-04-02 DIAGNOSIS — J9601 Acute respiratory failure with hypoxia: Secondary | ICD-10-CM | POA: Diagnosis not present

## 2022-04-02 DIAGNOSIS — Z66 Do not resuscitate: Secondary | ICD-10-CM | POA: Diagnosis not present

## 2022-04-02 DIAGNOSIS — E43 Unspecified severe protein-calorie malnutrition: Secondary | ICD-10-CM | POA: Diagnosis not present

## 2022-04-02 DIAGNOSIS — K760 Fatty (change of) liver, not elsewhere classified: Secondary | ICD-10-CM | POA: Diagnosis not present

## 2022-04-02 DIAGNOSIS — J159 Unspecified bacterial pneumonia: Secondary | ICD-10-CM | POA: Diagnosis not present

## 2022-04-02 DIAGNOSIS — R4182 Altered mental status, unspecified: Secondary | ICD-10-CM | POA: Diagnosis not present

## 2022-04-02 DIAGNOSIS — J9611 Chronic respiratory failure with hypoxia: Secondary | ICD-10-CM | POA: Diagnosis not present

## 2022-04-02 DIAGNOSIS — R55 Syncope and collapse: Secondary | ICD-10-CM | POA: Diagnosis not present

## 2022-04-02 DIAGNOSIS — T83511S Infection and inflammatory reaction due to indwelling urethral catheter, sequela: Secondary | ICD-10-CM | POA: Diagnosis not present

## 2022-04-02 DIAGNOSIS — I714 Abdominal aortic aneurysm, without rupture, unspecified: Secondary | ICD-10-CM | POA: Diagnosis not present

## 2022-04-02 DIAGNOSIS — J189 Pneumonia, unspecified organism: Secondary | ICD-10-CM | POA: Diagnosis not present

## 2022-04-02 DIAGNOSIS — N390 Urinary tract infection, site not specified: Secondary | ICD-10-CM | POA: Diagnosis not present

## 2022-04-02 DIAGNOSIS — E7800 Pure hypercholesterolemia, unspecified: Secondary | ICD-10-CM | POA: Diagnosis not present

## 2022-04-02 DIAGNOSIS — R531 Weakness: Secondary | ICD-10-CM | POA: Diagnosis not present

## 2022-04-02 DIAGNOSIS — R000 Tachycardia, unspecified: Secondary | ICD-10-CM | POA: Diagnosis not present

## 2022-04-02 DIAGNOSIS — Z885 Allergy status to narcotic agent status: Secondary | ICD-10-CM | POA: Diagnosis not present

## 2022-04-02 DIAGNOSIS — Z85828 Personal history of other malignant neoplasm of skin: Secondary | ICD-10-CM | POA: Diagnosis not present

## 2022-04-02 DIAGNOSIS — Z8673 Personal history of transient ischemic attack (TIA), and cerebral infarction without residual deficits: Secondary | ICD-10-CM | POA: Diagnosis not present

## 2022-04-02 DIAGNOSIS — Z515 Encounter for palliative care: Secondary | ICD-10-CM | POA: Diagnosis not present

## 2022-04-02 DIAGNOSIS — J449 Chronic obstructive pulmonary disease, unspecified: Secondary | ICD-10-CM | POA: Diagnosis not present

## 2022-04-02 DIAGNOSIS — R6520 Severe sepsis without septic shock: Secondary | ICD-10-CM | POA: Diagnosis not present

## 2022-04-02 DIAGNOSIS — Z7401 Bed confinement status: Secondary | ICD-10-CM | POA: Diagnosis not present

## 2022-04-02 DIAGNOSIS — I959 Hypotension, unspecified: Secondary | ICD-10-CM | POA: Diagnosis not present

## 2022-04-02 DIAGNOSIS — J440 Chronic obstructive pulmonary disease with acute lower respiratory infection: Secondary | ICD-10-CM | POA: Diagnosis not present

## 2022-04-02 DIAGNOSIS — K219 Gastro-esophageal reflux disease without esophagitis: Secondary | ICD-10-CM | POA: Diagnosis not present

## 2022-04-02 LAB — GLUCOSE, CAPILLARY: Glucose-Capillary: 96 mg/dL (ref 70–99)

## 2022-04-02 MED ORDER — NITROFURANTOIN MONOHYD MACRO 100 MG PO CAPS: 100.0000 mg | ORAL_CAPSULE | Freq: Two times a day (BID) | ORAL | 0 refills | Status: AC

## 2022-04-02 MED ORDER — POTASSIUM CHLORIDE CRYS ER 20 MEQ PO TBCR: 20.0000 meq | EXTENDED_RELEASE_TABLET | Freq: Every day | ORAL | 0 refills | Status: AC

## 2022-04-02 MED ORDER — MAGNESIUM 400 MG PO TABS: 400.0000 mg | ORAL_TABLET | Freq: Every day | ORAL | Status: AC

## 2022-04-02 NOTE — TOC Transition Note (Signed)
Transition of Care [facility]) - CM/SW Discharge Note   Patient Details  Name: Ronnie Snyder MRN: [id number] Date of Birth: 10-17-1940  [facility] CM/SW Contact:  Lennart Pall, LCSW Phone Number: 04/02/2022, 1:18 PM   Clinical Narrative:     Pt medically cleared for dc today to [facility].  Pt and family aware and agreeable.  PTAR called at 12:30pm.  RN to call report [phone].  No further TOC needs.  Final next level of care: [place] Barriers to Discharge: Barriers Resolved   Patient Goals and CMS Choice        Discharge Placement PASRR number recieved: 03/31/22            Patient chooses bed at: [facility] Patient to be transferred to facility by: [place] Name of family member notified: son Patient and family notified of of transfer: 04/02/22  Discharge Plan and Services                DME Arranged: N/A DME Agency: NA                  Social Determinants of Health ([place]) Interventions     Readmission Risk Interventions    04/02/2022    1:17 PM 12/03/2021   12:27 PM  Readmission Risk Prevention Plan  Transportation Screening Complete Complete  PCP or Specialist Appt within 3-5 Days Complete   Home Care Screening  Complete  HRI or [place] Complete   Social Work Consult for [place] Planning/Counseling Complete   Palliative Care Screening Complete   Medication Review [employer]) Complete

## 2022-04-02 NOTE — Care Management Important Message (Signed)
Important Message  Patient Details  Name: Ronnie Snyder MRN: [id number] Date of Birth: 31-Jan-1941   Medicare Important Message Given:  Yes     Memory Argue 04/02/2022, 2:09 PM

## 2022-04-02 NOTE — Progress Notes (Signed)
  [facility]   MULTIDISCIPLINARY HEART VALVE TEAM  Mr Quast was referred for structural heart team evaluation after July 2023 admission.  Our team has made multiple attempts to arrange evaluation but the pt's son has been overwhelmed with all of the pt's medical issues.  The pt is currently admitted at [facility] and will be discharged to SNF and palliative care is assisting in the pt's care.  At this time I will remove the pt's name from our active list of TAVR consults.  If the pt needs evaluation in the future with the structural heart team then we will be happy to assist.

## 2022-04-02 NOTE — Discharge Summary (Signed)
Physician Discharge Summary  Ronnie Snyder VCB:[id number] DOB: Oct 30, 1940 DOA: 03/29/2022  PCP: Luetta Nutting, DO  Admit date: 03/29/2022 Discharge date: 04/02/2022  Admitted From: Home  Disposition:  Home   Recommendations for Outpatient Follow-up:  Follow up with PCP in 1-2 weeks Please obtain BMP/CBC in one week Schedule follow up with Urology as before    Discharge Condition: Stable CODE STATUS: Full code Diet recommendation: Regular diet  Discharge summary: 81 year old gentleman with history of COPD and chronic hypoxemia on 2 L oxygen at home, GERD, urine retention with Foley catheter, generalized debility brought to the emergency room by family members due to him not getting out of the bed, unable to take care of self and noted dark urine in the catheter.  In the emergency room urine was grossly abnormal that was taken from indwelling Foley catheter.  Patient was hemodynamic stable. Could not get out of the bed.  So admitted to the hospital.    # Acute UTI present on admission secondary to indwelling Foley catheter:  Urine culture with Enterococcus faecalis and Staphylococcus saprophyticus.  Clinically improving.   Initially treated with Rocephin.  Currently on vancomycin.   Foley catheter was exchanged, voiding trial was given but unsuccessful.   Catheter reinserted. Continue Flomax.   We will discharge with Foley, he will follow-up with his urologist.   Enterococcus and a Staphylococcus saprophyticus, allergy to penicillin.  Will use Macrobid for 7 more days to complete 10 days of therapy.  Debility and deconditioning: Profound physical deconditioning.  Refer to SNF.   Severe protein calorie malnutrition: Nutritional supplements.   Chronic hypoxemia with COPD: Not in exacerbation.  Bronchodilators.  Oxygen to keep saturation more than 90%.  Stable.   Hypomagnesemia: Replaced.  Will discharge with oral replacement.   Hypertension: Stable.  Hyperlipidemia:  Stable.   Pressure Injury 03/30/22 Coccyx Mid Stage 2 -  Partial thickness loss of dermis presenting as a shallow open injury with a red, pink wound bed without slough. red, skin tear (Active)  03/30/22 1348  Location: Coccyx  Location Orientation: Mid  Staging: Stage 2 -  Partial thickness loss of dermis presenting as a shallow open injury with a red, pink wound bed without slough.  Wound Description (Comments): red, skin tear  Present on Admission: Yes   Medically stable to transfer to SNF.    Discharge Diagnoses:  Principal Problem:   UTI (urinary tract infection) Active Problems:   HYPERCHOLESTEROLEMIA   HTN (hypertension)   Chronic respiratory failure with hypoxia ([place])   Protein-calorie malnutrition ([place])    Discharge Instructions  Discharge Instructions     Diet - low sodium heart healthy   Complete by: As directed    Discharge wound care:   Complete by: As directed    Pressure Injury 03/30/22 Coccyx Mid Stage 2 -  Partial thickness loss of dermis presenting as a shallow open injury with a red, pink wound bed without slough. red, skin tear Barrier dressing, off pressure points.   Increase activity slowly   Complete by: As directed       Allergies as of 04/02/2022       Reactions   Hydrocodone-acetaminophen Nausea Only, Other (See Comments)   Vertigo, also   Atorvastatin Nausea Only   Other Nausea Only, Other (See Comments)   Vertigo and nausea: "ALL PAINS/NARCOTIC MEDS-CANNOT TOLERATE WELL,"  per Med History prior to 05/23/17   Penicillins Rash   Has patient had a PCN reaction causing immediate rash, facial/tongue/throat swelling, SOB  or lightheadedness with hypotension: Unknown Has patient had a PCN reaction causing severe rash involving mucus membranes or skin necrosis: No Has patient had a PCN reaction that required hospitalization: No Has patient had a PCN reaction occurring within the last 10 years: No If all of the above answers are "NO", then may  proceed with Cephalosporin use.   Pioglitazone Nausea Only        Medication List     STOP taking these medications    aspirin EC 81 MG tablet   bethanechol 5 MG tablet Commonly known as: URECHOLINE       TAKE these medications    acetaminophen 500 MG tablet Commonly known as: TYLENOL Take 1,000 mg by mouth See admin instructions. Take 1,000 mg by mouth at lunch and bedtime   docusate sodium 100 MG capsule Commonly known as: COLACE Take 1 capsule (100 mg total) by mouth 2 (two) times daily as needed for mild constipation.   ferrous sulfate 325 (65 FE) MG tablet Take 325 mg by mouth daily with breakfast. What changed: Another medication with the same name was removed. Continue taking this medication, and follow the directions you see here.   folic acid 1 MG tablet Commonly known as: FOLVITE Take 1 mg by mouth every morning.   hydrochlorothiazide 25 MG tablet Commonly known as: HYDRODIURIL Take 1 tablet (25 mg total) by mouth daily.   ipratropium-albuterol 0.5-2.5 (3) MG/3ML Soln Commonly known as: DUONEB Take 3 mLs by nebulization every 6 (six) hours as needed.   levothyroxine 75 MCG tablet Commonly known as: SYNTHROID TAKE 1 TABLET(75 MCG) BY MOUTH DAILY BEFORE AND BREAKFAST What changed: See the new instructions.   Magnesium 400 MG Tabs Take 400 mg by mouth daily for 7 days.   methotrexate 2.5 MG tablet Commonly known as: RHEUMATREX Take 15 mg by mouth every Monday. Caution:Chemotherapy. Protect from light.   mineral oil liquid Take 5 mLs by mouth at bedtime as needed for mild constipation.   nitrofurantoin (macrocrystal-monohydrate) 100 MG capsule Commonly known as: Macrobid Take 1 capsule (100 mg total) by mouth 2 (two) times daily for 7 days.   OXYGEN Inhale 3-4 L/min into the lungs continuous.   pantoprazole 40 MG tablet Commonly known as: PROTONIX TAKE ONE TABLET DAILY What changed: when to take this   polyethylene glycol 17 g  packet Commonly known as: MIRALAX / GLYCOLAX Take 17 g by mouth daily as needed for moderate constipation. What changed: reasons to take this   potassium chloride SA 20 MEQ tablet Commonly known as: KLOR-CON M Take 1 tablet (20 mEq total) by mouth daily for 7 days.   rosuvastatin 20 MG tablet Commonly known as: CRESTOR TAKE ONE TABLET DAILY What changed: when to take this   tamsulosin 0.4 MG Caps capsule Commonly known as: FLOMAX TAKE ONE CAPSULE DAILY   traZODone 50 MG tablet Commonly known as: DESYREL Take 0.5 tablets (25 mg total) by mouth at bedtime.   VITAMIN D3 GUMMIES PO Take 1 tablet by mouth every morning.               Discharge Care Instructions  (From admission, onward)           Start     Ordered   04/02/22 0000  Discharge wound care:       Comments: Pressure Injury 03/30/22 Coccyx Mid Stage 2 -  Partial thickness loss of dermis presenting as a shallow open injury with a red, pink wound bed without slough.  red, skin tear Barrier dressing, off pressure points.   04/02/22 0737            Contact information for after-discharge care     Destination     [facility] Preferred SNF .   Service: Skilled Nursing Contact information: [address] [address] [phone]                    Allergies  Allergen Reactions   Hydrocodone-Acetaminophen Nausea Only and Other (See Comments)    Vertigo, also   Atorvastatin Nausea Only   Other Nausea Only and Other (See Comments)    Vertigo and nausea: "ALL PAINS/NARCOTIC MEDS-CANNOT TOLERATE WELL,"  per Med History prior to 05/23/17   Penicillins Rash    Has patient had a PCN reaction causing immediate rash, facial/tongue/throat swelling, SOB or lightheadedness with hypotension: Unknown Has patient had a PCN reaction causing severe rash involving mucus membranes or skin necrosis: No Has patient had a PCN reaction that required hospitalization:  No Has patient had a PCN reaction occurring within the last 10 years: No If all of the above answers are "NO", then may proceed with Cephalosporin use.    Pioglitazone Nausea Only    Consultations: None   Procedures/Studies: DG Chest Portable 1 View  Result Date: 03/29/2022 CLINICAL DATA:  Cough EXAM: PORTABLE CHEST 1 VIEW COMPARISON:  11/28/2021 FINDINGS: The patient is rotated to the right on today's radiograph, reducing diagnostic sensitivity and specificity. Substantial new airspace opacity in the right upper chest, thought to mostly be in the right upper lobe. Interval clearing of prior airspace opacity in the right lower lobe. Atherosclerotic calcification of the aortic arch. Heart size is within normal limits given the degree of rotation. Old left rib deformities compatible with healed fractures. Bony demineralization. IMPRESSION: 1. Substantial new airspace opacity in the right upper lobe, thought to mostly be in the right upper lobe, suspicious for pneumonia. 2. Interval clearing of prior airspace opacity in the right lower lobe. 3. Bony demineralization. Electronically Signed   By: Van Clines M.D.   On: 03/29/2022 14:42   (Echo, Carotid, EGD, Colonoscopy, ERCP)    Subjective: Patient was seen and examined.  Eating breakfast.  Denies any complaints.  Alert awake and full of sense of humor today.   Discharge Exam: Vitals:   04/01/22 2345 04/02/22 0607  BP:  (!) 155/68  Pulse: 78 78  Resp: 16 18  Temp:  98.2 F (36.8 C)  SpO2: 96% 96%   Vitals:   04/01/22 1441 04/01/22 2330 04/01/22 2345 04/02/22 0607  BP: (!) 91/53 (!) 111/51  (!) 155/68  Pulse: 75 74 78 78  Resp: '18 18 16 18  '$ Temp: 97.9 F (36.6 C) 98.2 F (36.8 C)  98.2 F (36.8 C)  TempSrc: Oral Oral  Oral  SpO2: 95% (!) 87% 96% 96%  Weight:      Height:        General: Pt is alert, awake, not in acute distress Frail and debilitated.  Cachectic. Cardiovascular: RRR, S1/S2 +, no rubs, no  gallops Respiratory: CTA bilaterally, no wheezing, no rhonchi Abdominal: Soft, NT, ND, bowel sounds + Extremities: no edema, no cyanosis Foley catheter with clear urine.    The results of significant diagnostics from this hospitalization (including imaging, microbiology, ancillary and laboratory) are listed below for reference.     Microbiology: Recent Results (from the past 240 hour(s))  Urine Culture     Status:  Abnormal   Collection Time: 03/29/22 12:29 PM   Specimen: Urine, Catheterized  Result Value Ref Range Status   Specimen Description   Final    URINE, CATHETERIZED Performed at [facility] [place] [address], [place], [place] [address]    Special Requests   Final    NONE Performed at [facility], [place] [address] [place], [place] [address]    Culture (A)  Final    >=100,000 COLONIES/mL ENTEROCOCCUS FAECALIS >=100,000 COLONIES/mL STAPHYLOCOCCUS SAPROPHYTICUS    Report Status 04/01/2022 FINAL  Final   Organism ID, Bacteria ENTEROCOCCUS FAECALIS (A)  Final   Organism ID, Bacteria STAPHYLOCOCCUS SAPROPHYTICUS (A)  Final      Susceptibility   Staphylococcus saprophyticus - MIC*    CIPROFLOXACIN <=0.5 SENSITIVE Sensitive     GENTAMICIN <=0.5 SENSITIVE Sensitive     NITROFURANTOIN <=16 SENSITIVE Sensitive     OXACILLIN >=4 RESISTANT Resistant     TETRACYCLINE <=1 SENSITIVE Sensitive     VANCOMYCIN <=0.5 SENSITIVE Sensitive     TRIMETH/SULFA <=10 SENSITIVE Sensitive     CLINDAMYCIN <=0.25 SENSITIVE Sensitive     RIFAMPIN <=0.5 SENSITIVE Sensitive     Inducible Clindamycin NEGATIVE Sensitive     * >=100,000 COLONIES/mL STAPHYLOCOCCUS SAPROPHYTICUS   Enterococcus faecalis - MIC*    AMPICILLIN <=2 SENSITIVE Sensitive     NITROFURANTOIN <=16 SENSITIVE Sensitive     VANCOMYCIN 1 SENSITIVE Sensitive     * >=100,000 COLONIES/mL ENTEROCOCCUS FAECALIS  Resp Panel by RT-PCR (Flu A&B, Covid) Anterior Nasal Swab     Status: None   Collection  Time: 03/29/22  2:11 PM   Specimen: Anterior Nasal Swab  Result Value Ref Range Status   SARS Coronavirus 2 by RT PCR NEGATIVE NEGATIVE Final    Comment: (NOTE) SARS-CoV-2 target nucleic acids are NOT DETECTED.  The SARS-CoV-2 RNA is generally detectable in upper respiratory specimens during the acute phase of infection. The lowest concentration of SARS-CoV-2 viral copies this assay can detect is 138 copies/mL. A negative result does not preclude SARS-Cov-2 infection and should not be used as the sole basis for treatment or other patient management decisions. A negative result may occur with  improper specimen collection/handling, submission of specimen other than nasopharyngeal swab, presence of viral mutation(s) within the areas targeted by this assay, and inadequate number of viral copies(<138 copies/mL). A negative result must be combined with clinical observations, patient history, and epidemiological information. The expected result is Negative.  Fact Sheet for Patients:  EntrepreneurPulse.com.au  Fact Sheet for Healthcare Providers:  IncredibleEmployment.be  This test is no t yet approved or cleared by the [place] FDA and  has been authorized for detection and/or diagnosis of SARS-CoV-2 by FDA under an Emergency Use Authorization (EUA). This EUA will remain  in effect (meaning this test can be used) for the duration of the COVID-19 declaration under Section 564(b)(1) of the Act, 21 U.S.C.section 360bbb-3(b)(1), unless the authorization is terminated  or revoked sooner.       Influenza A by PCR NEGATIVE NEGATIVE Final   Influenza B by PCR NEGATIVE NEGATIVE Final    Comment: (NOTE) The Xpert Xpress SARS-CoV-2/FLU/RSV plus assay is intended as an aid in the diagnosis of influenza from Nasopharyngeal swab specimens and should not be used as a sole basis for treatment. Nasal washings and aspirates are unacceptable for Xpert Xpress  SARS-CoV-2/FLU/RSV testing.  Fact Sheet for Patients: EntrepreneurPulse.com.au  Fact Sheet for Healthcare Providers: IncredibleEmployment.be  This test is not yet  approved or cleared by the [place] and has been authorized for detection and/or diagnosis of SARS-CoV-2 by FDA under an Emergency Use Authorization (EUA). This EUA will remain in effect (meaning this test can be used) for the duration of the COVID-19 declaration under Section 564(b)(1) of the Act, 21 U.S.C. section 360bbb-3(b)(1), unless the authorization is terminated or revoked.  Performed at [facility], [place] [address], [place], [place] [address]      Labs: BNP (last 3 results) Recent Labs    11/27/21 0157  BNP 466.5*   Basic Metabolic Panel: Recent Labs  Lab 03/29/22 0937 04/01/22 0334  NA 134* 132*  K 3.4* 3.1*  CL 82* 88*  CO2 44* 37*  GLUCOSE 109* 108*  BUN 24* 10  CREATININE 0.36* 0.42*  CALCIUM 9.2 8.6*  MG  --  1.5*  PHOS  --  3.4   Liver Function Tests: No results for input(s): "AST", "ALT", "ALKPHOS", "BILITOT", "PROT", "ALBUMIN" in the last 168 hours. No results for input(s): "LIPASE", "AMYLASE" in the last 168 hours. No results for input(s): "AMMONIA" in the last 168 hours. CBC: Recent Labs  Lab 03/29/22 0937 04/01/22 0334  WBC 6.1 5.7  NEUTROABS 4.7 4.6  HGB 10.1* 9.2*  HCT 32.2* 29.0*  MCV 102.5* 102.1*  PLT 186 162   Cardiac Enzymes: No results for input(s): "CKTOTAL", "CKMB", "CKMBINDEX", "TROPONINI" in the last 168 hours. BNP: Invalid input(s): "POCBNP" CBG: Recent Labs  Lab 03/30/22 0814  GLUCAP 73   D-Dimer No results for input(s): "DDIMER" in the last 72 hours. Hgb A1c No results for input(s): "HGBA1C" in the last 72 hours. Lipid Profile No results for input(s): "CHOL", "HDL", "LDLCALC", "TRIG", "CHOLHDL", "LDLDIRECT" in the last 72 hours. Thyroid function studies No results for input(s):  "TSH", "T4TOTAL", "T3FREE", "THYROIDAB" in the last 72 hours.  Invalid input(s): "FREET3" Anemia work up No results for input(s): "VITAMINB12", "FOLATE", "FERRITIN", "TIBC", "IRON", "RETICCTPCT" in the last 72 hours. Urinalysis    Component Value Date/Time   COLORURINE AMBER (A) 03/29/2022 1229   APPEARANCEUR CLOUDY (A) 03/29/2022 1229   LABSPEC 1.019 03/29/2022 1229   PHURINE 7.0 03/29/2022 1229   GLUCOSEU NEGATIVE 03/29/2022 1229   GLUCOSEU NEGATIVE 10/02/2017 0824   HGBUR MODERATE (A) 03/29/2022 1229   HGBUR negative 11/13/2007 1003   BILIRUBINUR NEGATIVE 03/29/2022 1229   BILIRUBINUR NEGATIVE 10/26/2018 1027   KETONESUR NEGATIVE 03/29/2022 1229   PROTEINUR 100 (A) 03/29/2022 1229   UROBILINOGEN 0.2 12/17/2018 0843   NITRITE NEGATIVE 03/29/2022 1229   LEUKOCYTESUR LARGE (A) 03/29/2022 1229   Sepsis Labs Recent Labs  Lab 03/29/22 0937 04/01/22 0334  WBC 6.1 5.7   Microbiology Recent Results (from the past 240 hour(s))  Urine Culture     Status: Abnormal   Collection Time: 03/29/22 12:29 PM   Specimen: Urine, Catheterized  Result Value Ref Range Status   Specimen Description   Final    URINE, CATHETERIZED Performed at [facility], [place] [address] [place], [place] [address]    Special Requests   Final    NONE Performed at [facility], [place] [address], [place], [place] [address]    Culture (A)  Final    >=100,000 COLONIES/mL ENTEROCOCCUS FAECALIS >=100,000 COLONIES/mL STAPHYLOCOCCUS SAPROPHYTICUS    Report Status 04/01/2022 FINAL  Final   Organism ID, Bacteria ENTEROCOCCUS FAECALIS (A)  Final   Organism ID, Bacteria STAPHYLOCOCCUS SAPROPHYTICUS (A)  Final      Susceptibility   Staphylococcus  saprophyticus - MIC*    CIPROFLOXACIN <=0.5 SENSITIVE Sensitive     GENTAMICIN <=0.5 SENSITIVE Sensitive     NITROFURANTOIN <=16 SENSITIVE Sensitive     OXACILLIN >=4 RESISTANT Resistant     TETRACYCLINE <=1 SENSITIVE Sensitive      VANCOMYCIN <=0.5 SENSITIVE Sensitive     TRIMETH/SULFA <=10 SENSITIVE Sensitive     CLINDAMYCIN <=0.25 SENSITIVE Sensitive     RIFAMPIN <=0.5 SENSITIVE Sensitive     Inducible Clindamycin NEGATIVE Sensitive     * >=100,000 COLONIES/mL STAPHYLOCOCCUS SAPROPHYTICUS   Enterococcus faecalis - MIC*    AMPICILLIN <=2 SENSITIVE Sensitive     NITROFURANTOIN <=16 SENSITIVE Sensitive     VANCOMYCIN 1 SENSITIVE Sensitive     * >=100,000 COLONIES/mL ENTEROCOCCUS FAECALIS  Resp Panel by RT-PCR (Flu A&B, Covid) Anterior Nasal Swab     Status: None   Collection Time: 03/29/22  2:11 PM   Specimen: Anterior Nasal Swab  Result Value Ref Range Status   SARS Coronavirus 2 by RT PCR NEGATIVE NEGATIVE Final    Comment: (NOTE) SARS-CoV-2 target nucleic acids are NOT DETECTED.  The SARS-CoV-2 RNA is generally detectable in upper respiratory specimens during the acute phase of infection. The lowest concentration of SARS-CoV-2 viral copies this assay can detect is 138 copies/mL. A negative result does not preclude SARS-Cov-2 infection and should not be used as the sole basis for treatment or other patient management decisions. A negative result may occur with  improper specimen collection/handling, submission of specimen other than nasopharyngeal swab, presence of viral mutation(s) within the areas targeted by this assay, and inadequate number of viral copies(<138 copies/mL). A negative result must be combined with clinical observations, patient history, and epidemiological information. The expected result is Negative.  Fact Sheet for Patients:  EntrepreneurPulse.com.au  Fact Sheet for Healthcare Providers:  IncredibleEmployment.be  This test is no t yet approved or cleared by the [place] FDA and  has been authorized for detection and/or diagnosis of SARS-CoV-2 by FDA under an Emergency Use Authorization (EUA). This EUA will remain  in effect (meaning this test  can be used) for the duration of the COVID-19 declaration under Section 564(b)(1) of the Act, 21 U.S.C.section 360bbb-3(b)(1), unless the authorization is terminated  or revoked sooner.       Influenza A by PCR NEGATIVE NEGATIVE Final   Influenza B by PCR NEGATIVE NEGATIVE Final    Comment: (NOTE) The Xpert Xpress SARS-CoV-2/FLU/RSV plus assay is intended as an aid in the diagnosis of influenza from Nasopharyngeal swab specimens and should not be used as a sole basis for treatment. Nasal washings and aspirates are unacceptable for Xpert Xpress SARS-CoV-2/FLU/RSV testing.  Fact Sheet for Patients: EntrepreneurPulse.com.au  Fact Sheet for Healthcare Providers: IncredibleEmployment.be  This test is not yet approved or cleared by the [place] FDA and has been authorized for detection and/or diagnosis of SARS-CoV-2 by FDA under an Emergency Use Authorization (EUA). This EUA will remain in effect (meaning this test can be used) for the duration of the COVID-19 declaration under Section 564(b)(1) of the Act, 21 U.S.C. section 360bbb-3(b)(1), unless the authorization is terminated or revoked.  Performed at [facility], [place] [address], [place], [place] [address]      Time coordinating discharge: 32 minutes  SIGNED:   Barb Merino, MD  [facility] 04/02/2022, 7:37 AM

## 2022-04-02 NOTE — Progress Notes (Signed)
[facility] Hospital Liaison note: ° °This patient is currently enrolled in [facility] outpatient-based Palliative Care. Will continue to follow for disposition. ° °Please call with any outpatient palliative questions or concerns. ° °Thank you, °Dee Curry, LPN °[facility] Hospital Liaison °[phone] °

## 2022-04-03 ENCOUNTER — Encounter (HOSPITAL_COMMUNITY): Payer: Self-pay

## 2022-04-03 ENCOUNTER — Emergency Department (HOSPITAL_COMMUNITY): Payer: Medicare Other

## 2022-04-03 ENCOUNTER — Other Ambulatory Visit: Payer: Self-pay

## 2022-04-03 ENCOUNTER — Inpatient Hospital Stay (HOSPITAL_COMMUNITY)
Admission: EM | Admit: 2022-04-03 | Discharge: 2022-04-19 | DRG: 951 | Disposition: E | Payer: Medicare Other | Source: Skilled Nursing Facility | Attending: Internal Medicine | Admitting: Internal Medicine

## 2022-04-03 DIAGNOSIS — F17210 Nicotine dependence, cigarettes, uncomplicated: Secondary | ICD-10-CM | POA: Diagnosis present

## 2022-04-03 DIAGNOSIS — E039 Hypothyroidism, unspecified: Secondary | ICD-10-CM | POA: Diagnosis present

## 2022-04-03 DIAGNOSIS — K219 Gastro-esophageal reflux disease without esophagitis: Secondary | ICD-10-CM | POA: Diagnosis not present

## 2022-04-03 DIAGNOSIS — J9601 Acute respiratory failure with hypoxia: Secondary | ICD-10-CM | POA: Diagnosis not present

## 2022-04-03 DIAGNOSIS — T83511S Infection and inflammatory reaction due to indwelling urethral catheter, sequela: Secondary | ICD-10-CM | POA: Diagnosis not present

## 2022-04-03 DIAGNOSIS — G9341 Metabolic encephalopathy: Secondary | ICD-10-CM | POA: Diagnosis not present

## 2022-04-03 DIAGNOSIS — N390 Urinary tract infection, site not specified: Secondary | ICD-10-CM | POA: Diagnosis not present

## 2022-04-03 DIAGNOSIS — Z85828 Personal history of other malignant neoplasm of skin: Secondary | ICD-10-CM | POA: Diagnosis not present

## 2022-04-03 DIAGNOSIS — E43 Unspecified severe protein-calorie malnutrition: Secondary | ICD-10-CM | POA: Diagnosis present

## 2022-04-03 DIAGNOSIS — Z880 Allergy status to penicillin: Secondary | ICD-10-CM

## 2022-04-03 DIAGNOSIS — R000 Tachycardia, unspecified: Secondary | ICD-10-CM | POA: Diagnosis not present

## 2022-04-03 DIAGNOSIS — J440 Chronic obstructive pulmonary disease with acute lower respiratory infection: Secondary | ICD-10-CM | POA: Diagnosis not present

## 2022-04-03 DIAGNOSIS — Z79899 Other long term (current) drug therapy: Secondary | ICD-10-CM

## 2022-04-03 DIAGNOSIS — A419 Sepsis, unspecified organism: Secondary | ICD-10-CM | POA: Diagnosis present

## 2022-04-03 DIAGNOSIS — J9602 Acute respiratory failure with hypercapnia: Secondary | ICD-10-CM | POA: Diagnosis not present

## 2022-04-03 DIAGNOSIS — Z681 Body mass index (BMI) 19 or less, adult: Secondary | ICD-10-CM

## 2022-04-03 DIAGNOSIS — Z515 Encounter for palliative care: Principal | ICD-10-CM

## 2022-04-03 DIAGNOSIS — Z9981 Dependence on supplemental oxygen: Secondary | ICD-10-CM

## 2022-04-03 DIAGNOSIS — R0902 Hypoxemia: Secondary | ICD-10-CM | POA: Diagnosis present

## 2022-04-03 DIAGNOSIS — R0689 Other abnormalities of breathing: Secondary | ICD-10-CM | POA: Diagnosis not present

## 2022-04-03 DIAGNOSIS — J159 Unspecified bacterial pneumonia: Secondary | ICD-10-CM | POA: Diagnosis present

## 2022-04-03 DIAGNOSIS — E7800 Pure hypercholesterolemia, unspecified: Secondary | ICD-10-CM | POA: Diagnosis present

## 2022-04-03 DIAGNOSIS — Z95828 Presence of other vascular implants and grafts: Secondary | ICD-10-CM | POA: Diagnosis not present

## 2022-04-03 DIAGNOSIS — J9621 Acute and chronic respiratory failure with hypoxia: Secondary | ICD-10-CM | POA: Diagnosis present

## 2022-04-03 DIAGNOSIS — J90 Pleural effusion, not elsewhere classified: Secondary | ICD-10-CM | POA: Diagnosis not present

## 2022-04-03 DIAGNOSIS — Z885 Allergy status to narcotic agent status: Secondary | ICD-10-CM | POA: Diagnosis not present

## 2022-04-03 DIAGNOSIS — J9611 Chronic respiratory failure with hypoxia: Secondary | ICD-10-CM | POA: Diagnosis not present

## 2022-04-03 DIAGNOSIS — K760 Fatty (change of) liver, not elsewhere classified: Secondary | ICD-10-CM | POA: Diagnosis present

## 2022-04-03 DIAGNOSIS — Z66 Do not resuscitate: Secondary | ICD-10-CM | POA: Diagnosis present

## 2022-04-03 DIAGNOSIS — R4182 Altered mental status, unspecified: Secondary | ICD-10-CM | POA: Diagnosis not present

## 2022-04-03 DIAGNOSIS — Z8249 Family history of ischemic heart disease and other diseases of the circulatory system: Secondary | ICD-10-CM

## 2022-04-03 DIAGNOSIS — R6520 Severe sepsis without septic shock: Secondary | ICD-10-CM | POA: Diagnosis present

## 2022-04-03 DIAGNOSIS — J168 Pneumonia due to other specified infectious organisms: Secondary | ICD-10-CM | POA: Diagnosis not present

## 2022-04-03 DIAGNOSIS — I739 Peripheral vascular disease, unspecified: Secondary | ICD-10-CM | POA: Diagnosis not present

## 2022-04-03 DIAGNOSIS — I10 Essential (primary) hypertension: Secondary | ICD-10-CM | POA: Diagnosis not present

## 2022-04-03 DIAGNOSIS — I714 Abdominal aortic aneurysm, without rupture, unspecified: Secondary | ICD-10-CM | POA: Diagnosis present

## 2022-04-03 DIAGNOSIS — Z79890 Hormone replacement therapy (postmenopausal): Secondary | ICD-10-CM

## 2022-04-03 DIAGNOSIS — G9340 Encephalopathy, unspecified: Secondary | ICD-10-CM | POA: Diagnosis not present

## 2022-04-03 DIAGNOSIS — J189 Pneumonia, unspecified organism: Secondary | ICD-10-CM

## 2022-04-03 DIAGNOSIS — J9622 Acute and chronic respiratory failure with hypercapnia: Secondary | ICD-10-CM | POA: Diagnosis present

## 2022-04-03 DIAGNOSIS — Z888 Allergy status to other drugs, medicaments and biological substances status: Secondary | ICD-10-CM

## 2022-04-03 DIAGNOSIS — Z8673 Personal history of transient ischemic attack (TIA), and cerebral infarction without residual deficits: Secondary | ICD-10-CM

## 2022-04-03 DIAGNOSIS — I959 Hypotension, unspecified: Secondary | ICD-10-CM | POA: Diagnosis not present

## 2022-04-03 DIAGNOSIS — R55 Syncope and collapse: Secondary | ICD-10-CM | POA: Diagnosis not present

## 2022-04-03 LAB — URINALYSIS, ROUTINE W REFLEX MICROSCOPIC
Bilirubin Urine: NEGATIVE
Glucose, UA: NEGATIVE mg/dL
Hgb urine dipstick: NEGATIVE
Ketones, ur: 20 mg/dL — AB
Nitrite: NEGATIVE
Protein, ur: 100 mg/dL — AB
Specific Gravity, Urine: 1.02 (ref 1.005–1.030)
WBC, UA: >50 WBC/hpf — ABNORMAL HIGH (ref 0–5)
pH: 5 (ref 5.0–8.0)

## 2022-04-03 LAB — CBC WITH DIFFERENTIAL/PLATELET
Abs Immature Granulocytes: 0.09 10*3/uL — ABNORMAL HIGH (ref 0.00–0.07)
Basophils Absolute: 0 10*3/uL (ref 0.0–0.1)
Basophils Relative: 1 %
Eosinophils Absolute: 0 10*3/uL (ref 0.0–0.5)
Eosinophils Relative: 0 %
HCT: 32.8 % — ABNORMAL LOW (ref 39.0–52.0)
Hemoglobin: 10.7 g/dL — ABNORMAL LOW (ref 13.0–17.0)
Immature Granulocytes: 3 %
Lymphocytes Relative: 12 %
Lymphs Abs: 0.4 10*3/uL — ABNORMAL LOW (ref 0.7–4.0)
MCH: 33.1 pg (ref 26.0–34.0)
MCHC: 32.6 g/dL (ref 30.0–36.0)
MCV: 101.5 fL — ABNORMAL HIGH (ref 80.0–100.0)
Monocytes Absolute: 0.5 10*3/uL (ref 0.1–1.0)
Monocytes Relative: 15 %
Neutro Abs: 2.5 10*3/uL (ref 1.7–7.7)
Neutrophils Relative %: 69 %
Platelets: 221 10*3/uL (ref 150–400)
RBC: 3.23 MIL/uL — ABNORMAL LOW (ref 4.22–5.81)
RDW: 16.5 % — ABNORMAL HIGH (ref 11.5–15.5)
WBC: 3.6 10*3/uL — ABNORMAL LOW (ref 4.0–10.5)
nRBC: 0 % (ref 0.0–0.2)

## 2022-04-03 LAB — I-STAT VENOUS BLOOD GAS, ED
Acid-Base Excess: 12 mmol/L — ABNORMAL HIGH (ref 0.0–2.0)
Bicarbonate: 39.8 mmol/L — ABNORMAL HIGH (ref 20.0–28.0)
Calcium, Ion: 1.15 mmol/L (ref 1.15–1.40)
HCT: 32 % — ABNORMAL LOW (ref 39.0–52.0)
Hemoglobin: 10.9 g/dL — ABNORMAL LOW (ref 13.0–17.0)
O2 Saturation: 87 %
Potassium: 5 mmol/L (ref 3.5–5.1)
Sodium: 127 mmol/L — ABNORMAL LOW (ref 135–145)
TCO2: 42 mmol/L — ABNORMAL HIGH (ref 22–32)
pCO2, Ven: 72.2 mmHg (ref 44–60)
pH, Ven: 7.349 (ref 7.25–7.43)
pO2, Ven: 58 mmHg — ABNORMAL HIGH (ref 32–45)

## 2022-04-03 LAB — CBG MONITORING, ED: Glucose-Capillary: 91 mg/dL (ref 70–99)

## 2022-04-03 LAB — TSH: TSH: 1.498 u[IU]/mL (ref 0.350–4.500)

## 2022-04-03 LAB — AMMONIA: Ammonia: 28 umol/L (ref 9–35)

## 2022-04-03 MED ORDER — HYDROMORPHONE HCL 1 MG/ML IJ SOLN
0.5000 mg | INTRAMUSCULAR | Status: DC | PRN
  Administered 2022-04-03: 0.5 mg via INTRAVENOUS
  Filled 2022-04-03: qty 1

## 2022-04-03 MED ORDER — BISACODYL 10 MG RE SUPP: 10.0000 mg | Freq: Every day | RECTAL | Status: DC | PRN

## 2022-04-03 MED ORDER — HYDROMORPHONE HCL 1 MG/ML IJ SOLN: 0.5000 mg | INTRAMUSCULAR | Status: DC | PRN

## 2022-04-03 MED ORDER — HALOPERIDOL 0.5 MG PO TABS: 0.5000 mg | ORAL_TABLET | ORAL | Status: DC | PRN

## 2022-04-03 MED ORDER — HYDROMORPHONE HCL 1 MG/ML IJ SOLN
1.0000 mg | INTRAMUSCULAR | Status: DC
  Administered 2022-04-03 (×2): 1 mg via INTRAVENOUS
  Filled 2022-04-03 (×2): qty 1

## 2022-04-03 MED ORDER — LORAZEPAM 1 MG PO TABS: 1.0000 mg | ORAL_TABLET | ORAL | Status: DC | PRN

## 2022-04-03 MED ORDER — SODIUM CHLORIDE 0.9 % IV SOLN
INTRAVENOUS | Status: DC
  Administered 2022-04-03: 125 mL/h via INTRAVENOUS

## 2022-04-03 MED ORDER — SODIUM CHLORIDE 0.9 % IV BOLUS
500.0000 mL | Freq: Once | INTRAVENOUS | Status: AC
  Administered 2022-04-03: 500 mL via INTRAVENOUS

## 2022-04-03 MED ORDER — BIOTENE DRY MOUTH MT LIQD: 15.0000 mL | OROMUCOSAL | Status: DC | PRN

## 2022-04-03 MED ORDER — ONDANSETRON 4 MG PO TBDP: 4.0000 mg | ORAL_TABLET | Freq: Four times a day (QID) | ORAL | Status: DC | PRN

## 2022-04-03 MED ORDER — HALOPERIDOL LACTATE 5 MG/ML IJ SOLN: 0.5000 mg | INTRAMUSCULAR | Status: DC | PRN

## 2022-04-03 MED ORDER — GLYCOPYRROLATE 0.2 MG/ML IJ SOLN: 0.4000 mg | INTRAMUSCULAR | Status: DC | PRN

## 2022-04-03 MED ORDER — LORAZEPAM 2 MG/ML IJ SOLN: 1.0000 mg | INTRAMUSCULAR | Status: DC | PRN

## 2022-04-03 MED ORDER — SODIUM CHLORIDE 0.9 % IV SOLN
500.0000 mg | Freq: Once | INTRAVENOUS | Status: AC
  Administered 2022-04-03: 500 mg via INTRAVENOUS
  Filled 2022-04-03: qty 5

## 2022-04-03 MED ORDER — POLYVINYL ALCOHOL 1.4 % OP SOLN
1.0000 [drp] | Freq: Four times a day (QID) | OPHTHALMIC | Status: DC | PRN
  Filled 2022-04-03: qty 15

## 2022-04-03 MED ORDER — SODIUM CHLORIDE 0.9 % IV SOLN
2.0000 g | Freq: Once | INTRAVENOUS | Status: AC
  Administered 2022-04-03: 2 g via INTRAVENOUS
  Filled 2022-04-03: qty 20

## 2022-04-03 MED ORDER — LORAZEPAM 2 MG/ML PO CONC: 1.0000 mg | ORAL | Status: DC | PRN

## 2022-04-03 MED ORDER — ONDANSETRON HCL 4 MG/2ML IJ SOLN: 4.0000 mg | Freq: Four times a day (QID) | INTRAMUSCULAR | Status: DC | PRN

## 2022-04-03 MED ORDER — HALOPERIDOL LACTATE 2 MG/ML PO CONC: 0.5000 mg | ORAL | Status: DC | PRN

## 2022-04-03 NOTE — ED Triage Notes (Signed)
[place]  from [place], [place] from [place] yesterday for UTI, found unresponsive this AM by facility staff, LKN [id number] this AM

## 2022-04-03 NOTE — ED Notes (Signed)
MD informed that son is requesting pt be a DNR after speaking with his pt's wife.

## 2022-04-03 NOTE — ED Notes (Signed)
Family remains at bedside.

## 2022-04-03 NOTE — ED Provider Notes (Signed)
[facility] EMERGENCY DEPARTMENT Provider Note   CSN: [id number] Arrival date & time: 04/16/2022  2536     History  Chief Complaint  Patient presents with   unresponsive    Ronnie Snyder is a 81 y.o. male.  HPI   Patient has a history of COPD on chronic home O2, GERD urinary retention indwelling Foley catheter who was recently admitted to the hospital on November 10.  Patient was discharged yesterday on November 14.  Patient was admitted to the hospital due to weakness and urinary tract infection.  Patient was treated with antibiotics and discharged on covid.  Patient was released yesterday.  At the time of discharge  he  was very confused but was able to speak according to his son.  Patient states that he was called by the nursing facility because the patient was unresponsive today.  At the bedside patient does not answer any questions and is not able to provide any history  Home Medications Prior to Admission medications   Medication Sig Start Date End Date Taking? Authorizing Provider  bisacodyl (DULCOLAX) 10 MG suppository Place 10 mg rectally as needed for moderate constipation (if not relieved by MOM).   Yes [provider]  cholecalciferol (VITAMIN D3) 25 MCG (1000 UNIT) tablet Take 1,000 Units by mouth daily.   Yes [provider]  docusate sodium (COLACE) 100 MG capsule Take 1 capsule (100 mg total) by mouth 2 (two) times daily as needed for mild constipation. 12/03/21  Yes Hosie Poisson, MD  ferrous sulfate 325 (65 FE) MG tablet Take 325 mg by mouth daily with breakfast.   Yes [provider]  folic acid (FOLVITE) 1 MG tablet Take 1 mg by mouth daily.   Yes [provider]  hydrochlorothiazide (HYDRODIURIL) 25 MG tablet Take 1 tablet (25 mg total) by mouth daily. 03/01/22 04/09/2022 Yes Matthews, Einar Pheasant, DO  ipratropium-albuterol (DUONEB) 0.5-2.5 (3) MG/3ML SOLN Take 3 mLs by nebulization every 6 (six) hours as needed.  12/03/21  Yes Hosie Poisson, MD  levothyroxine (SYNTHROID) 75 MCG tablet TAKE 1 TABLET(75 MCG) BY MOUTH DAILY BEFORE AND BREAKFAST Patient taking differently: Take 75 mcg by mouth daily before breakfast. 09/05/21  Yes Luetta Nutting, DO  Magnesium 400 MG TABS Take 400 mg by mouth daily for 7 days. 04/02/22 04/09/22 Yes Ghimire, Dante Gang, MD  Magnesium Hydroxide (MILK OF MAGNESIA PO) Take 30 mLs by mouth as needed (constipation).   Yes [provider]  methotrexate (RHEUMATREX) 2.5 MG tablet Take 15 mg by mouth every Monday.   Yes [provider]  nitrofurantoin, macrocrystal-monohydrate, (MACROBID) 100 MG capsule Take 1 capsule (100 mg total) by mouth 2 (two) times daily for 7 days. 04/02/22 04/09/22 Yes Ghimire, Dante Gang, MD  pantoprazole (PROTONIX) 40 MG tablet TAKE ONE TABLET DAILY 02/28/22  Yes Luetta Nutting, DO  potassium chloride SA (KLOR-CON M) 20 MEQ tablet Take 1 tablet (20 mEq total) by mouth daily for 7 days. 04/02/22 04/09/22 Yes Barb Merino, MD  rosuvastatin (CRESTOR) 20 MG tablet TAKE ONE TABLET DAILY 03/26/22  Yes Luetta Nutting, DO  Sodium Phosphates (RA SALINE ENEMA RE) Place 1 Dose rectally as needed (if not relieved by biscodyl suppository).   Yes [provider]  tamsulosin (FLOMAX) 0.4 MG CAPS capsule TAKE ONE CAPSULE DAILY 03/26/22  Yes Luetta Nutting, DO  traZODone (DESYREL) 50 MG tablet Take 0.5 tablets (25 mg total) by mouth at bedtime. 03/01/22  Yes Luetta Nutting, DO  acetaminophen (TYLENOL) 500 MG tablet  Take 1,000 mg by mouth See admin instructions. Take 1,000 mg by mouth at lunch and bedtime Patient not taking: Reported on 03/25/2022    [provider]  mineral oil liquid Take 5 mLs by mouth at bedtime as needed for mild constipation. Patient not taking: Reported on 03/21/2022    [provider]  OXYGEN Inhale 3-4 L/min into the lungs continuous.    [provider]  polyethylene glycol (MIRALAX / GLYCOLAX) 17 g packet  Take 17 g by mouth daily as needed for moderate constipation. Patient not taking: Reported on 04/11/2022 12/03/21   Hosie Poisson, MD      Allergies    Hydrocodone-acetaminophen, Atorvastatin, Other, Penicillins, and Pioglitazone    Review of Systems   Review of Systems  Physical Exam Updated Vital Signs BP 117/65   Pulse (!) 103   Temp 98.9 F (37.2 C) (Oral)   Resp 15   SpO2 100%  Physical Exam Vitals and nursing note reviewed.  Constitutional:      Appearance: He is well-developed. He is ill-appearing.  HENT:     Head: Normocephalic and atraumatic.     Right Ear: External ear normal.     Left Ear: External ear normal.     Mouth/Throat:     Mouth: Mucous membranes are dry.  Eyes:     General: No scleral icterus.       Right eye: No discharge.        Left eye: No discharge.     Conjunctiva/sclera: Conjunctivae normal.  Neck:     Trachea: No tracheal deviation.  Cardiovascular:     Rate and Rhythm: Tachycardia present.  Pulmonary:     Effort: Pulmonary effort is normal. No respiratory distress.     Breath sounds: No stridor.  Abdominal:     General: There is no distension.  Musculoskeletal:        General: No swelling or deformity.     Cervical back: Neck supple.     Right lower leg: No edema.     Left lower leg: No edema.  Skin:    General: Skin is warm and dry.     Findings: No rash.  Neurological:     Mental Status: He is lethargic.     GCS: GCS eye subscore is 2. GCS verbal subscore is 1. GCS motor subscore is 5.     Cranial Nerves: Cranial nerve deficit: no gross deficits.     Comments: Eyes move slightly in response to voice, patient does not answer any questions, he does not follow any commands     ED Results / Procedures / Treatments   Labs (all labs ordered are listed, but only abnormal results are displayed) Labs Reviewed  CBC WITH DIFFERENTIAL/PLATELET - Abnormal; Notable for the following components:      Result Value   WBC 3.6 (*)    RBC  3.23 (*)    Hemoglobin 10.7 (*)    HCT 32.8 (*)    MCV 101.5 (*)    RDW 16.5 (*)    Lymphs Abs 0.4 (*)    Abs Immature Granulocytes 0.09 (*)    All other components within normal limits  URINALYSIS, ROUTINE W REFLEX MICROSCOPIC - Abnormal; Notable for the following components:   Color, Urine AMBER (*)    APPearance HAZY (*)    Ketones, ur 20 (*)    Protein, ur 100 (*)    Leukocytes,Ua MODERATE (*)    WBC, UA >50 (*)  Bacteria, UA RARE (*)    All other components within normal limits  I-STAT VENOUS BLOOD GAS, ED - Abnormal; Notable for the following components:   pCO2, Ven 72.2 (*)    pO2, Ven 58 (*)    Bicarbonate 39.8 (*)    TCO2 42 (*)    Acid-Base Excess 12.0 (*)    Sodium 127 (*)    HCT 32.0 (*)    Hemoglobin 10.9 (*)    All other components within normal limits  AMMONIA  TSH  COMPREHENSIVE METABOLIC PANEL  CBG MONITORING, ED    EKG EKG Interpretation  Date/Time:  Wednesday April 03 2022 07:57:09 EST Ventricular Rate:  108 PR Interval:  152 QRS Duration: 77 QT Interval:  326 QTC Calculation: 437 R Axis:   -39 Text Interpretation: Sinus tachycardia Left axis deviation Abnormal R-wave progression, early transition No significant change since last tracing Confirmed by Dorie Rank [phone]) on 04/06/2022 8:07:09 AM  Radiology CT HEAD WO CONTRAST  Result Date: 04/17/2022 CLINICAL DATA:  Mental status change EXAM: CT HEAD WITHOUT CONTRAST TECHNIQUE: Contiguous axial images were obtained from the base of the skull through the vertex without intravenous contrast. RADIATION DOSE REDUCTION: This exam was performed according to the departmental dose-optimization program which includes automated exposure control, adjustment of the mA and/or kV according to patient size and/or use of iterative reconstruction technique. COMPARISON:  Head CT dated November 25, 2021 FINDINGS: Brain: Mild chronic white matter ischemic change. No evidence of acute infarction, hemorrhage,  hydrocephalus, extra-axial collection or mass lesion/mass effect. Vascular: No hyperdense vessel or unexpected calcification. Skull: Normal. Negative for fracture or focal lesion. Sinuses/Orbits: No acute finding. Other: None. IMPRESSION: No acute intracranial abnormality. Electronically Signed   By: Yetta Glassman M.D.   On: 04/06/2022 09:35   DG Chest Port 1 View  Result Date: 03/20/2022 CLINICAL DATA:  Altered mental status EXAM: PORTABLE CHEST 1 VIEW COMPARISON:  Radiograph 02/26/2022 FINDINGS: Unchanged cardiomediastinal silhouette. There are increased lower lung airspace opacities bilaterally with small effusions bones are unchanged. No pneumothorax. Unchanged right upper lung airspace disease and diffuse interstitial opacities. IMPRESSION: Increased lower lung airspace disease bilaterally with persistent right upper lung airspace disease concerning for pneumonia and/or lower lung atelectasis with small effusions. Electronically Signed   By: Maurine Simmering M.D.   On: 03/23/2022 08:23    Procedures Procedures    Medications Ordered in ED Medications  azithromycin (ZITHROMAX) 500 mg in sodium chloride 0.9 % 250 mL IVPB (500 mg Intravenous New Bag/Given 04/17/2022 1025)  0.9 %  sodium chloride infusion (125 mL/hr Intravenous New Bag/Given 03/26/2022 1056)  sodium chloride 0.9 % bolus 500 mL (0 mLs Intravenous Stopped 04/01/2022 0903)  cefTRIAXone (ROCEPHIN) 2 g in sodium chloride 0.9 % 100 mL IVPB (0 g Intravenous Stopped 03/23/2022 1054)    ED Course/ Medical Decision Making/ A&P Clinical Course as of 03/20/2022 1104  Wed Apr 03, 2022  0804 Discussed CODE STATUS with son.  He is tearful.  Patient states patient was made DNR at the skilled nursing facility however he is not really sure.  Patient states previously his father was full code.  Patient's son states he is not sure what to do. [JK]  [id number] Son discussed CODE STATUS with patient's wife.  Would like the patient to be DNR [JK]  0913 I-Stat  venous blood gas, ED(!!) Elevated PCO2 noted.  Normal pH [JK]  1002 Chest x-ray shows worsening findings concerning for pneumonia [JK]  1043 Head CT without acute findings. [  JK]  1043 Patient's oxygen saturation started to decrease.  Patient placed on nonrebreather [JK]  1043 Pt not a candidate for bipap with his altered mental status.  Discussed with resp.  Further discussion with family and we will proceed primarily with comfort measures, iv abx, iv fluids [JK]  1104 DIscussed with Dr Roosevelt Locks regarding admission [JK]    Clinical Course User Index [JK] Dorie Rank, MD                           Medical Decision Making Frontal diagnosis includes but not limited to metabolic encephalopathy, hypercarbia, cerebral hemorrhage, stroke  Problems Addressed: Hypercarbia: chronic illness or injury with exacerbation, progression, or side effects of treatment Metabolic encephalopathy: acute illness or injury that poses a threat to life or bodily functions Pneumonia of right lung due to infectious organism, unspecified part of lung: chronic illness or injury with exacerbation, progression, or side effects of treatment  Amount and/or Complexity of Data Reviewed Labs: ordered. Decision-making details documented in ED Course. Radiology: ordered and independent interpretation performed. Discussion of management or test interpretation with external provider(s): Case discussed with Dr. Roosevelt Locks  Risk Prescription drug management. Decision regarding hospitalization.   Patient presented to the ED with increasing lethargy.  Patient's health has been declining recently.  Patient noted to have increasing oxygen requirements.  He also has a component of hypercarbia.  X-ray suggest worsening pneumonia.  Likely cause of his altered mental status.  Do not exclude an occult stroke but no acute findings noted on head CT and favor metabolic causes for his altered mental status.  Long discussion with the patient's wife and  son.  At this time we will proceed primarily with comfort measures.  They do not want further blood draws at this time.  No intubation and no CPR.  We will continue with IV fluids and antibiotics.  Plan on admission to hospital for further treatment.        Final Clinical Impression(s) / ED Diagnoses Final diagnoses:  Pneumonia of right lung due to infectious organism, unspecified part of lung  Hypercarbia  Metabolic encephalopathy    Rx / DC Orders ED Discharge Orders     None         Dorie Rank, MD 03/27/2022 1106

## 2022-04-03 NOTE — Consult Note (Signed)
Consultation Note Date: 04/11/2022   Patient Name: Ronnie Snyder  DOB: Jan 20, 1941  MRN: [id number]  Age / Sex: 81 y.o., male  PCP: Luetta Nutting, DO Referring Physician: Lequita Halt, MD  Reason for Consultation: Non pain symptom management, Psychosocial/spiritual support, and Terminal Care  HPI/Patient Profile: 81 y.o. male  with past medical history of f COPD with chronic hypoxic respiratory failure 2 L oxygen continuously, GERD, chronic urinary retention with indwelling Foley catheter, severe protein calorie malnutrition, recent UTI bacteremia and mentation changes admitted on 04/16/2022 with unresponsiveness from SNF.   After discussion with providers in the ED, patient's wife made the decision to transition patient to comfort care.  PMT has been consulted to assist with end-of-life care.  Clinical Assessment and Goals of Care:  I have reviewed medical records including EPIC notes, labs and imaging, received report from RN, assessed the patient and then met at the bedside with patient's wife and son to discuss diagnosis prognosis, GOC, EOL wishes, disposition and options.  I introduced Palliative Medicine as specialized medical care for people living with serious illness. It focuses on providing relief from the symptoms and stress of a serious illness. The goal is to improve quality of life for both the patient and the family.  We discussed a brief life review of the patient and then focused on their current illness.  The natural disease trajectory and expectations at EOL were discussed.  I attempted to elicit values and goals of care important to the patient.    Medical History Review and Understanding:  We reviewed patient's ongoing health decline since his fall and right arm fracture, with surgery resulting in prolonged time on the ventilator and oxygen dependence ever since.  Patient's wife  previously worked in [employer] and reports a good understanding of the severity of his illness.  She states clearly that she knows he is dying.  Social History: Patient and his wife have been married for over 33 years.  They had a son who died last year and 1 living son, Ronnie Snyder, who works with [employer].  Patient worked in [employer] for over 40 years before retirement.  Functional and Nutritional State: Patient has rapidly declined over the past 6 months.  Prior to this time he was driving and completely independent.  He is now unable to ambulate and has very poor intake at SNF.  Palliative Symptoms: Dyspnea  Code Status: Concepts specific to code status, artifical feeding and hydration, and rehospitalization were considered and discussed.  DNR confirmed.  Discussion: After review of patient's acute and chronic illnesses, it is clear that patient's wife is at peace with her decision to transition patient to comfort measures today.  She feels he would not want to just "lay there" and prolong the dying process with ongoing interventions.  She is having a difficult time with her son Scott's acceptance of this.  Emotional support and therapeutic listening was provided.  Ronnie Snyder shares that he feels patient just needs more time for improvement.  Education on the dying process was provided with emphasis on acute respiratory failure and inability to effectively treat while also honoring patient's wishes to avoid the ventilator.  Provide education on BiPAP as an intervention and contraindications with altered mental status.  An extensive conversation around symptom management was had with review of options including opioids for dyspnea.  Patient's wife feels that he needs a drip while the son thinks this would be "killing him."  Counseled that patient's underlying disease processes  are what will ultimately end his life and the purpose of managing his dyspnea is to ease his suffering as the natural  disease process is allowed to continue.  Counseled on the evidence-based research that has indicated patients receiving opioid infusions for comfort do not die sooner.  Family is agreeable to schedule medications at this time and will consider a continuous infusion if dyspnea worsens despite this.  The process of weaning oxygen for comfort was explained in detail and family is agreeable.  We discussed hospice resources and feasible settings will vary based on patient's oxygen requirements throughout the weaning process.   Discussed the importance of continued conversation with family and the medical providers regarding overall plan of care and treatment options, ensuring decisions are within the context of the patient's values and GOCs.   Questions and concerns were addressed. The family was encouraged to call with questions or concerns.  PMT will continue to support holistically.   SUMMARY OF RECOMMENDATIONS   -Continue DNR -Continue comfort care measures Scheduled Dilaudid 1 mg IV every 3 hours, family prefers to defer Dilaudid drip for now Dilaudid PRN for pain/air hunger/comfort Robinul PRN for excessive secretions Ativan PRN for agitation/anxiety/dyspnea Zofran PRN for nausea Liquifilm tears PRN for dry eyes Haldol PRN for agitation/anxiety May have comfort feeding Comfort cart for family Unrestricted visitations in the setting of EOL (per policy) Oxygen PRN 2L or less for comfort. No escalation.   -Wean oxygen as tolerated to room air for comfort, discussed with RN -Psychosocial and emotional support provided -PMT will continue to follow and support   Prognosis:  < 2 weeks  Discharge Planning: To Be Determined      Primary Diagnoses: Present on Admission:  Acute respiratory failure with hypoxia and hypercapnia ([place])  Hypoxia  Physical Exam Vitals and nursing note reviewed.  Constitutional:      General: He is not in acute distress.    Appearance: He is ill-appearing.      Interventions: Face mask in place.     Comments: NRB in place  Cardiovascular:     Rate and Rhythm: Normal rate.  Pulmonary:     Effort: Tachypnea and accessory muscle usage present.  Skin:    General: Skin is warm and dry.  Neurological:     Mental Status: He is lethargic.    Vital Signs: BP 138/68   Pulse 88   Temp 98 F (36.7 C)   Resp 19   SpO2 100%  Pain Scale: CPOT    SpO2: SpO2: 100 % O2 Device:SpO2: 100 % O2 Flow Rate: .O2 Flow Rate (L/min): 15 L/min  Palliative Assessment/Data: 10%      Total time: I spent 90 minutes in the care of the patient today in the above activities and documenting the encounter.  MDM: High   Nealy Hickmon Johnnette Litter, PA-C  Palliative Medicine Team Team phone # [phone]  Thank you for allowing the Palliative Medicine Team to assist in the care of this patient. Please utilize secure chat with additional questions, if there is no response within 30 minutes please call the above phone number.  Palliative Medicine Team providers are available by phone from 7am to 7pm daily and can be reached through the team cell phone.  Should this patient require assistance outside of these hours, please call the patient's attending physician.

## 2022-04-03 NOTE — ED Notes (Signed)
Pt to CT and returned.
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
## 2022-04-03 NOTE — ED Notes (Signed)
Placed on non-rebreather. MD and RN at bedside. RT called.

## 2022-04-03 NOTE — ED Notes (Signed)
Attempted to redraw CMP, unable to get enough for sample.

## 2022-04-03 NOTE — ED Notes (Signed)
O2 increased to 5L.

## 2022-04-19 NOTE — Death Summary Note (Signed)
DEATH SUMMARY   Patient Details  Name: Ronnie Snyder MRN: [id number] DOB: June 08, 1940 DDU:[id number], Einar Phe[place], DO Admission/Discharge Information   Admit Date:  2022/04/05  Date of Death: Date of Death: 04-06-22  Time of Death: Time of Death: 0104  Length of Stay: 1   Principle Cause of death: Septic shock/severe sepsis  Hospital Diagnoses: Principal Problem:   Hypoxia Active Problems:   Acute respiratory failure with hypoxia and hypercapnia [facility])   Hospital Course: 81 year old male patient with history of COPD chronic hypoxic respite failure, indwelling Foley catheter presented to hospital with severe sepsis likely from a new pneumonia, then patient developed encephalopathy and worsening mentations, soon became obtunded and into coma.  Family including wife and children at bedside maintain the patient is DNR CODE STATUS and do not wish to escalate patient care or any aggressive medical management.  Instead family agreed patient switched to palliative care/hospice.  Patient was started on hospice pain medications and other symptomatic management and patient passed away same night.  Family informed.  Assessment and Plan: No notes have been filed under this hospital service. Service: Hospitalist        Procedures: None  Consultations: Palliative care  The results of significant diagnostics from this hospitalization (including imaging, microbiology, ancillary and laboratory) are listed below for reference.   Significant Diagnostic Studies: CT HEAD WO CONTRAST  Result Date: Apr 05, 2022 CLINICAL DATA:  Mental status change EXAM: CT HEAD WITHOUT CONTRAST TECHNIQUE: Contiguous axial images were obtained from the base of the skull through the vertex without intravenous contrast. RADIATION DOSE REDUCTION: This exam was performed according to the departmental dose-optimization program which includes automated exposure control, adjustment of the mA and/or kV according to patient  size and/or use of iterative reconstruction technique. COMPARISON:  Head CT dated November 25, 2021 FINDINGS: Brain: Mild chronic white matter ischemic change. No evidence of acute infarction, hemorrhage, hydrocephalus, extra-axial collection or mass lesion/mass effect. Vascular: No hyperdense vessel or unexpected calcification. Skull: Normal. Negative for fracture or focal lesion. Sinuses/Orbits: No acute finding. Other: None. IMPRESSION: No acute intracranial abnormality. Electronically Signed   By: Yetta Glassman M.D.   On: Apr 05, 2022 09:35   DG Chest Port 1 View  Result Date: 04-05-22 CLINICAL DATA:  Altered mental status EXAM: PORTABLE CHEST 1 VIEW COMPARISON:  Radiograph 02/26/2022 FINDINGS: Unchanged cardiomediastinal silhouette. There are increased lower lung airspace opacities bilaterally with small effusions bones are unchanged. No pneumothorax. Unchanged right upper lung airspace disease and diffuse interstitial opacities. IMPRESSION: Increased lower lung airspace disease bilaterally with persistent right upper lung airspace disease concerning for pneumonia and/or lower lung atelectasis with small effusions. Electronically Signed   By: Maurine Simmering M.D.   On: 05-Apr-2022 08:23   DG Chest Portable 1 View  Result Date: 03/29/2022 CLINICAL DATA:  Cough EXAM: PORTABLE CHEST 1 VIEW COMPARISON:  11/28/2021 FINDINGS: The patient is rotated to the right on today's radiograph, reducing diagnostic sensitivity and specificity. Substantial new airspace opacity in the right upper chest, thought to mostly be in the right upper lobe. Interval clearing of prior airspace opacity in the right lower lobe. Atherosclerotic calcification of the aortic arch. Heart size is within normal limits given the degree of rotation. Old left rib deformities compatible with healed fractures. Bony demineralization. IMPRESSION: 1. Substantial new airspace opacity in the right upper lobe, thought to mostly be in the right upper lobe,  suspicious for pneumonia. 2. Interval clearing of prior airspace opacity in the right lower lobe. 3. Bony demineralization. Electronically  Signed   By: Van Clines M.D.   On: 03/29/2022 14:42    Microbiology: Recent Results (from the past 240 hour(s))  Urine Culture     Status: Abnormal   Collection Time: 03/29/22 12:29 PM   Specimen: Urine, Catheterized  Result Value Ref Range Status   Specimen Description   Final    URINE, CATHETERIZED Performed at [facility] [address] [place], [place] [address]    Special Requests   Final    NONE Performed at [facility], [place] [address], [place], [place] [address]    Culture (A)  Final    >=100,000 COLONIES/mL ENTEROCOCCUS FAECALIS >=100,000 COLONIES/mL STAPHYLOCOCCUS SAPROPHYTICUS    Report Status 04/01/2022 FINAL  Final   Organism ID, Bacteria ENTEROCOCCUS FAECALIS (A)  Final   Organism ID, Bacteria STAPHYLOCOCCUS SAPROPHYTICUS (A)  Final      Susceptibility   Staphylococcus saprophyticus - MIC*    CIPROFLOXACIN <=0.5 SENSITIVE Sensitive     GENTAMICIN <=0.5 SENSITIVE Sensitive     NITROFURANTOIN <=16 SENSITIVE Sensitive     OXACILLIN >=4 RESISTANT Resistant     TETRACYCLINE <=1 SENSITIVE Sensitive     VANCOMYCIN <=0.5 SENSITIVE Sensitive     TRIMETH/SULFA <=10 SENSITIVE Sensitive     CLINDAMYCIN <=0.25 SENSITIVE Sensitive     RIFAMPIN <=0.5 SENSITIVE Sensitive     Inducible Clindamycin NEGATIVE Sensitive     * >=100,000 COLONIES/mL STAPHYLOCOCCUS SAPROPHYTICUS   Enterococcus faecalis - MIC*    AMPICILLIN <=2 SENSITIVE Sensitive     NITROFURANTOIN <=16 SENSITIVE Sensitive     VANCOMYCIN 1 SENSITIVE Sensitive     * >=100,000 COLONIES/mL ENTEROCOCCUS FAECALIS  Resp Panel by RT-PCR (Flu A&B, Covid) Anterior Nasal Swab     Status: None   Collection Time: 03/29/22  2:11 PM   Specimen: Anterior Nasal Swab  Result Value Ref Range Status   SARS Coronavirus 2 by RT PCR NEGATIVE NEGATIVE  Final    Comment: (NOTE) SARS-CoV-2 target nucleic acids are NOT DETECTED.  The SARS-CoV-2 RNA is generally detectable in upper respiratory specimens during the acute phase of infection. The lowest concentration of SARS-CoV-2 viral copies this assay can detect is 138 copies/mL. A negative result does not preclude SARS-Cov-2 infection and should not be used as the sole basis for treatment or other patient management decisions. A negative result may occur with  improper specimen collection/handling, submission of specimen other than nasopharyngeal swab, presence of viral mutation(s) within the areas targeted by this assay, and inadequate number of viral copies(<138 copies/mL). A negative result must be combined with clinical observations, patient history, and epidemiological information. The expected result is Negative.  Fact Sheet for Patients:  EntrepreneurPulse.com.au  Fact Sheet for Healthcare Providers:  IncredibleEmployment.be  This test is no t yet approved or cleared by the [place] FDA and  has been authorized for detection and/or diagnosis of SARS-CoV-2 by FDA under an Emergency Use Authorization (EUA). This EUA will remain  in effect (meaning this test can be used) for the duration of the COVID-19 declaration under Section 564(b)(1) of the Act, 21 U.S.C.section 360bbb-3(b)(1), unless the authorization is terminated  or revoked sooner.       Influenza A by PCR NEGATIVE NEGATIVE Final   Influenza B by PCR NEGATIVE NEGATIVE Final    Comment: (NOTE) The Xpert Xpress SARS-CoV-2/FLU/RSV plus assay is intended as an aid in the diagnosis of influenza from Nasopharyngeal swab specimens and should not be used as a sole basis for  treatment. Nasal washings and aspirates are unacceptable for Xpert Xpress SARS-CoV-2/FLU/RSV testing.  Fact Sheet for Patients: EntrepreneurPulse.com.au  Fact Sheet for Healthcare  Providers: IncredibleEmployment.be  This test is not yet approved or cleared by the [place] FDA and has been authorized for detection and/or diagnosis of SARS-CoV-2 by FDA under an Emergency Use Authorization (EUA). This EUA will remain in effect (meaning this test can be used) for the duration of the COVID-19 declaration under Section 564(b)(1) of the Act, 21 U.S.C. section 360bbb-3(b)(1), unless the authorization is terminated or revoked.  Performed at [facility], [place] [address] [place], [place] [address]     Time spent: 30 minutes  Signed: Lequita Halt, MD 05/02/22

## 2022-04-19 DEATH — deceased

## 2022-06-04 IMAGING — CT CT CTA ABD/PEL W/CM AND/OR W/O CM
2 of 7 series · 14 of 46 positions shown, 16 images · IV contrast (APPLIED)
Comparison: 08/02/2020

CLINICAL DATA: Abdominal aortic aneurysm

EXAM:
CT ANGIOGRAPHY ABDOMEN AND PELVIS WITH CONTRAST AND WITHOUT CONTRAST
TECHNIQUE: Multidetector CT imaging of the abdomen and pelvis was performed
using the standard protocol during bolus administration of
intravenous contrast. Multiplanar reconstructed images and MIPs were
obtained and reviewed to evaluate the vascular anatomy.
CONTRAST:  100mL OMNIPAQUE IOHEXOL 350 MG/ML SOLN

[Series 5: axial arterial (person_name) · axial · arterial · 0.84mm/px · z∈[+878,+1301]mm · 11 of 159 slices shown, 13 images]
[im 9/159  soft-tissue]
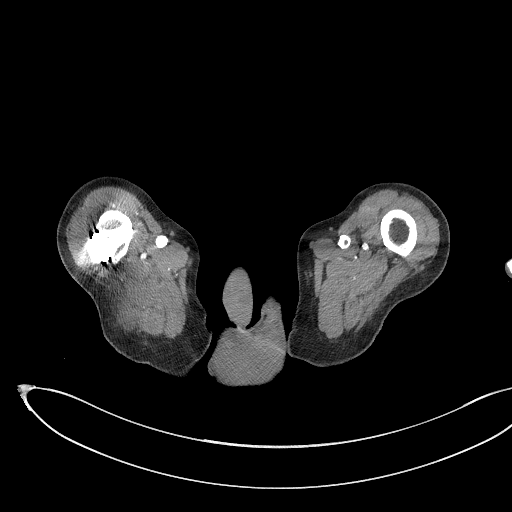
[im 9/159  bone]
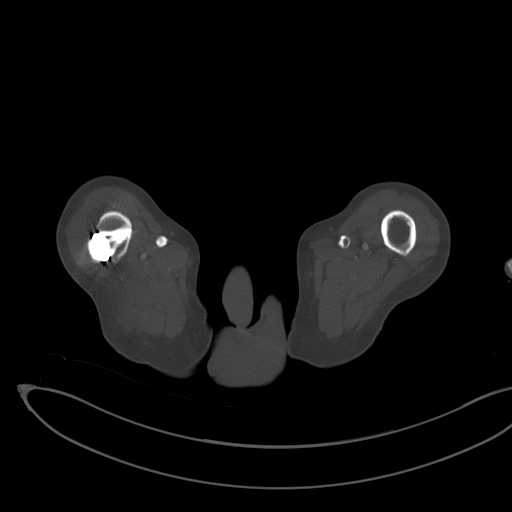
[im 25/159  soft-tissue]
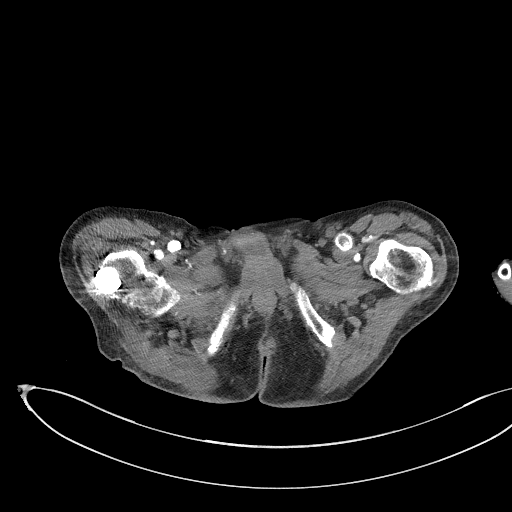
[im 42/159  soft-tissue]
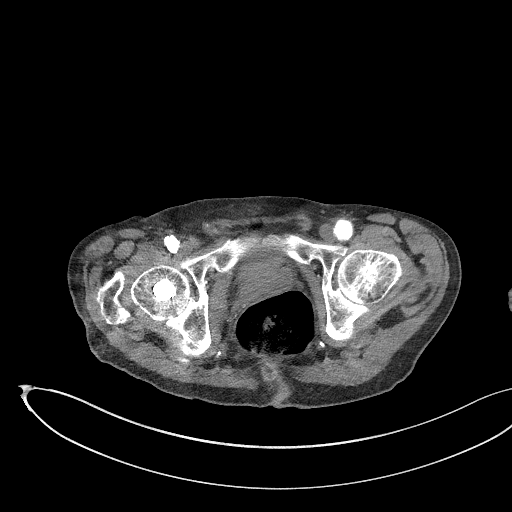
[im 50/159  soft-tissue]
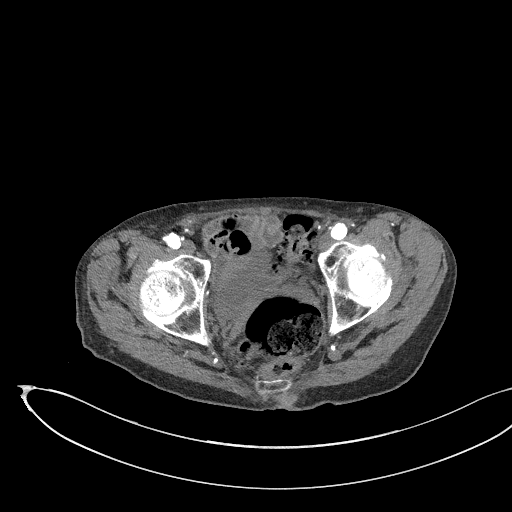
[im 67/159  soft-tissue]
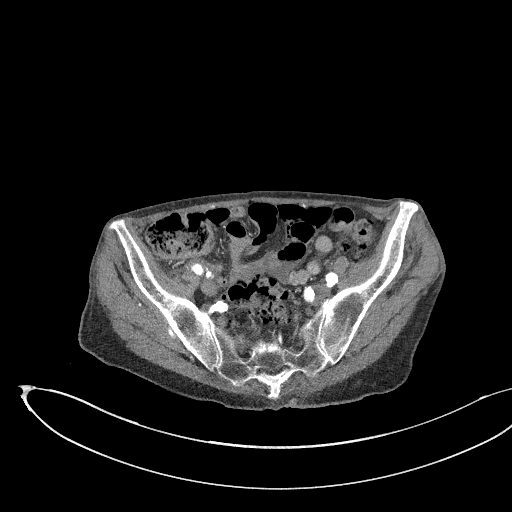
[im 84/159  soft-tissue]
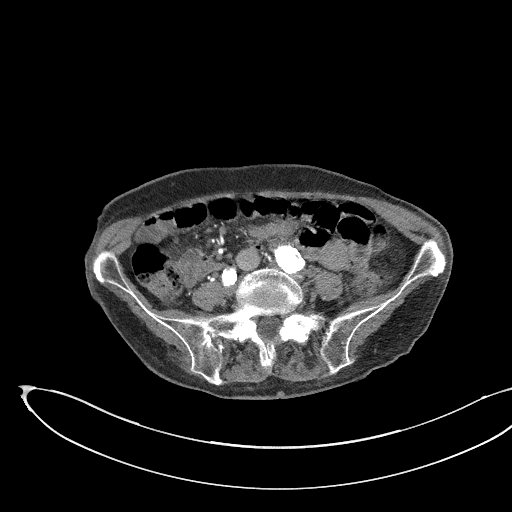
[im 92/159  soft-tissue]
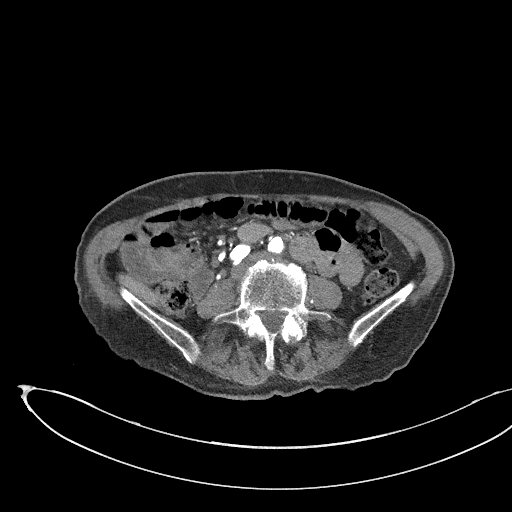
[im 109/159  soft-tissue]
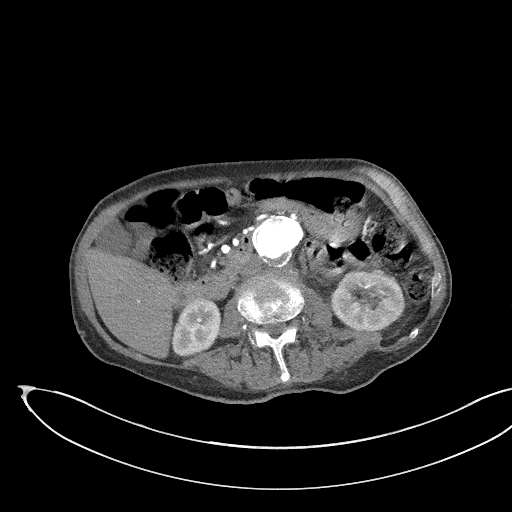
[im 117/159  soft-tissue]
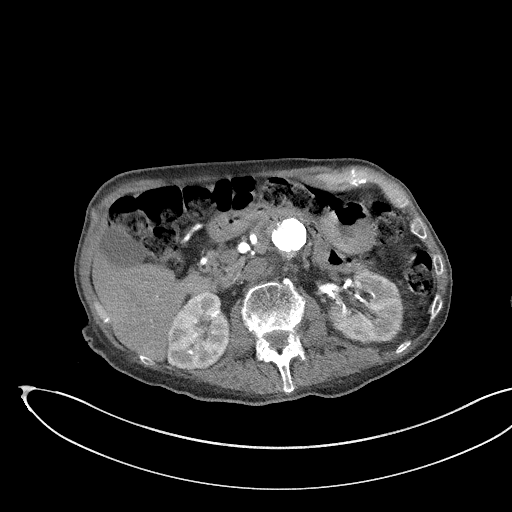
[im 117/159  bone]
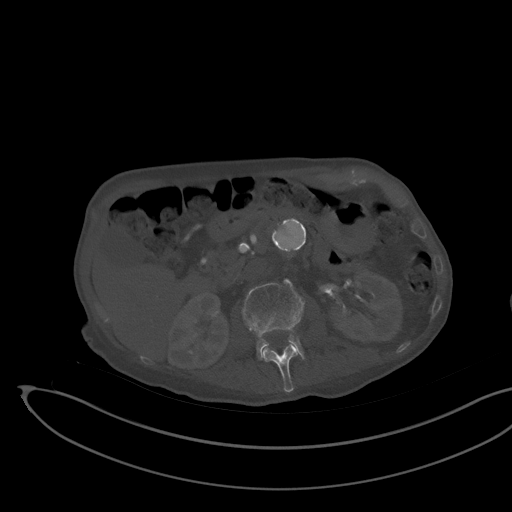
[im 134/159  soft-tissue]
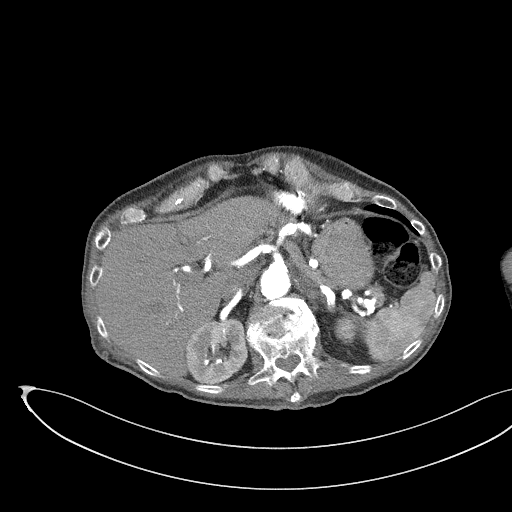
[im 150/159  soft-tissue]
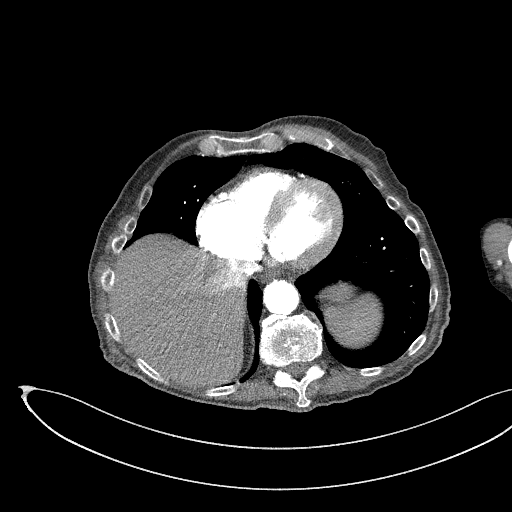

[Series 8: coronals (person_name) · coronal · 0.79mm/px · 3 of 151 slices shown]
[im 38/151  soft-tissue]
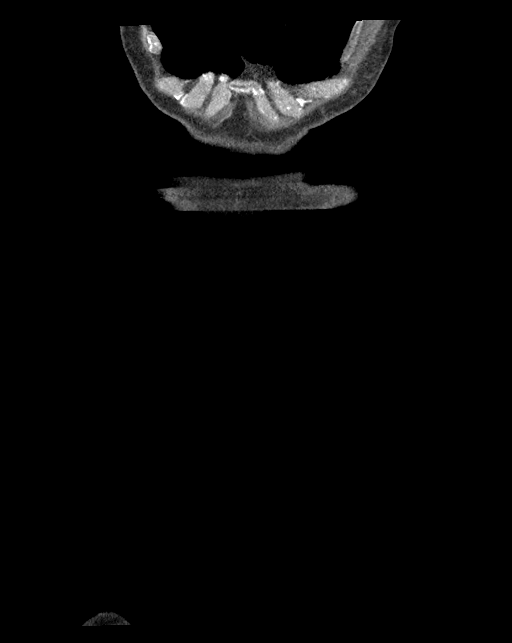
[im 76/151  soft-tissue]
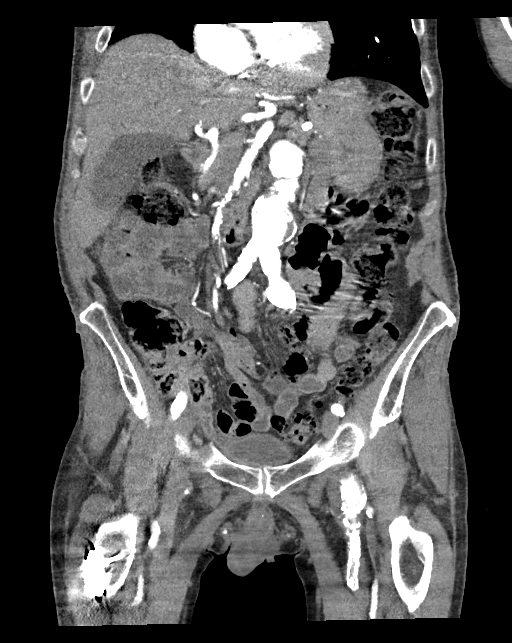
[im 113/151  soft-tissue]
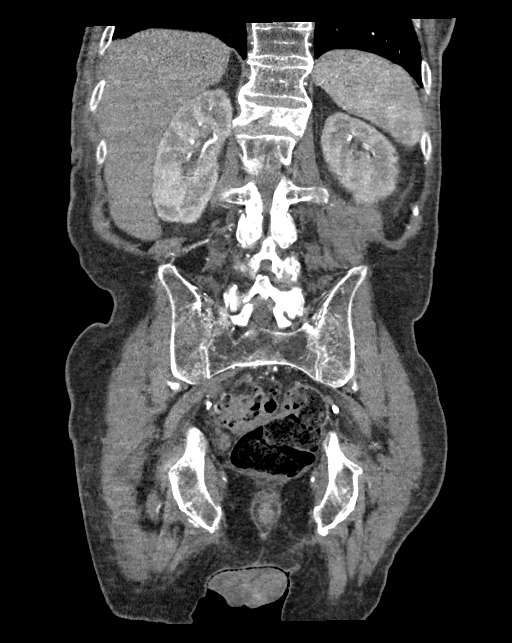

[14 of 46 positions shown; findings below may reference images not displayed]

FINDINGS: VASCULAR

Aorta: Extensive advanced calcific atherosclerosis of the abdominal
aorta. Slightly angulated lobulated fusiform abdominal aortic
aneurysm is overall stable in appearance with a maximal diameter in
the infrarenal component measuring 4.2 cm. Similar degree of
aneurysm mural thrombus. No significant interval change. No
dissection or evidence of rupture. No surrounding inflammatory
process.

Celiac: Calcified origin diffuse atherosclerotic changes but the
main celiac branches appear to remain patent.

SMA: Calcified origin without significant stenosis or occlusion. SMA
main trunk and its branches are also atherosclerotic but appear to
remain patent.

Renals: Calcified origins with mild ostial narrowings, worse on the
left but appear to remain patent. No accessory renal artery
appreciated.

IMA: Irregular atherosclerotic origin but appears to remain patent
off the infrarenal AAA including its branches peripherally.

Inflow: Extensive calcific atherosclerosis of the iliac vasculature.
Stable 2 cm aneurysm of the left common iliac artery. Degree of
calcification limits assessment for iliac stenosis. No pelvic iliac
occlusive process appreciated.

Proximal Outflow: Extensive calcific atherosclerosis of the
visualized femoral vasculature. Stable 3 cm aneurysm of the left
common femoral artery. Chronic occlusion of the proximal superficial
femoral arteries bilaterally. Proximal profunda femoral arteries
appear to remain patent.

Veins: No [place] process appreciated.

Review of the MIP images confirms the above findings.

NON-VASCULAR

Lower chest: No acute abnormality.

Hepatobiliary: No focal liver abnormality is seen. No gallstones,
gallbladder wall thickening, or biliary dilatation.

Pancreas: Unremarkable. No pancreatic ductal dilatation or
surrounding inflammatory changes.

Spleen: Normal in size without focal abnormality.

Adrenals/Urinary Tract: Stable nonspecific thickening of the left
adrenal gland. Right adrenal gland unremarkable. No renal
obstruction, hydronephrosis or acute finding. No focal renal
abnormality. Ureters are symmetric and decompressed. Urinary bladder
collapsed.

Stomach/Bowel: Limited without oral contrast for CTA protocol.
Negative for bowel obstruction, significant dilatation, ileus, or
free air. Appendix not visualized. Diverticulosis noted most
pronounced in the sigmoid. No definite acute inflammatory process.

No free fluid, fluid collection, hemorrhage, hematoma, abscess or
ascites.

Lymphatic: No bulky adenopathy.

Reproductive: No significant finding by CT.

Other: No abdominal wall hernia or abnormality. No abdominopelvic
ascites.

Musculoskeletal: Bones are osteopenic. Degenerative changes of the
spine. L1 compression fracture again noted with further vertebral
collapse when compared to 08/02/2020. There is also an L4
compression fracture, age indeterminate but new since 08/02/2020.
IMPRESSION: VASCULAR

Stable 4.2 cm aneurysm of the abdominal aorta.

Stable 2 cm aneurysm of the left common iliac artery.

Stable 3 cm aneurysm of the left common femoral artery.

Extensive calcific atherosclerosis of the entire abdomen, pelvis and
visualized femoral vasculature.

Chronic occlusions of the proximal superficial femoral arteries
bilaterally.

NON-VASCULAR

No other acute intra-abdominal or pelvic finding.

Colonic diverticulosis

Chronic L1 compression fracture but there is further compression of
the vertebral body compared to 08/02/2020.

Interval L4 compression fracture having a more acute to subacute
appearance but age indeterminate by CT. Vertebral body height loss
less than 50%.
